# Patient Record
Sex: Female | Born: 1950 | Race: White | Hispanic: No | Smoking: Never smoker
Health system: Southern US, Community
[De-identification: ages and names within clinical notes are randomized; demographics above are authoritative.]

## PROBLEM LIST (undated history)

## (undated) DIAGNOSIS — I480 Paroxysmal atrial fibrillation: Secondary | ICD-10-CM

## (undated) DIAGNOSIS — R32 Unspecified urinary incontinence: Secondary | ICD-10-CM

## (undated) DIAGNOSIS — Z96649 Presence of unspecified artificial hip joint: Secondary | ICD-10-CM

## (undated) DIAGNOSIS — M47817 Spondylosis without myelopathy or radiculopathy, lumbosacral region: Secondary | ICD-10-CM

## (undated) DIAGNOSIS — Z800 Family history of malignant neoplasm of digestive organs: Secondary | ICD-10-CM

## (undated) DIAGNOSIS — K219 Gastro-esophageal reflux disease without esophagitis: Secondary | ICD-10-CM

## (undated) DIAGNOSIS — R609 Edema, unspecified: Secondary | ICD-10-CM

## (undated) DIAGNOSIS — Z8049 Family history of malignant neoplasm of other genital organs: Secondary | ICD-10-CM

## (undated) DIAGNOSIS — K8020 Calculus of gallbladder without cholecystitis without obstruction: Secondary | ICD-10-CM

## (undated) DIAGNOSIS — M5020 Other cervical disc displacement, unspecified cervical region: Secondary | ICD-10-CM

## (undated) DIAGNOSIS — D689 Coagulation defect, unspecified: Secondary | ICD-10-CM

## (undated) DIAGNOSIS — I872 Venous insufficiency (chronic) (peripheral): Secondary | ICD-10-CM

## (undated) DIAGNOSIS — E041 Nontoxic single thyroid nodule: Secondary | ICD-10-CM

## (undated) DIAGNOSIS — K5900 Constipation, unspecified: Secondary | ICD-10-CM

## (undated) DIAGNOSIS — Z860100 Personal history of colon polyps, unspecified: Secondary | ICD-10-CM

## (undated) DIAGNOSIS — N390 Urinary tract infection, site not specified: Secondary | ICD-10-CM

## (undated) DIAGNOSIS — R112 Nausea with vomiting, unspecified: Secondary | ICD-10-CM

## (undated) DIAGNOSIS — Z9889 Other specified postprocedural states: Secondary | ICD-10-CM

## (undated) DIAGNOSIS — C55 Malignant neoplasm of uterus, part unspecified: Secondary | ICD-10-CM

## (undated) DIAGNOSIS — I739 Peripheral vascular disease, unspecified: Secondary | ICD-10-CM

## (undated) DIAGNOSIS — G4730 Sleep apnea, unspecified: Secondary | ICD-10-CM

## (undated) DIAGNOSIS — M419 Scoliosis, unspecified: Secondary | ICD-10-CM

## (undated) DIAGNOSIS — Z86018 Personal history of other benign neoplasm: Secondary | ICD-10-CM

## (undated) DIAGNOSIS — C439 Malignant melanoma of skin, unspecified: Secondary | ICD-10-CM

## (undated) DIAGNOSIS — T884XXA Failed or difficult intubation, initial encounter: Secondary | ICD-10-CM

## (undated) DIAGNOSIS — G4733 Obstructive sleep apnea (adult) (pediatric): Secondary | ICD-10-CM

## (undated) DIAGNOSIS — C50919 Malignant neoplasm of unspecified site of unspecified female breast: Secondary | ICD-10-CM

## (undated) DIAGNOSIS — I4891 Unspecified atrial fibrillation: Secondary | ICD-10-CM

## (undated) DIAGNOSIS — Z923 Personal history of irradiation: Secondary | ICD-10-CM

## (undated) DIAGNOSIS — E669 Obesity, unspecified: Secondary | ICD-10-CM

## (undated) DIAGNOSIS — Z8542 Personal history of malignant neoplasm of other parts of uterus: Secondary | ICD-10-CM

## (undated) DIAGNOSIS — Z8719 Personal history of other diseases of the digestive system: Secondary | ICD-10-CM

## (undated) HISTORY — DX: Family history of malignant neoplasm of other genital organs: Z80.49

## (undated) HISTORY — DX: Calculus of gallbladder without cholecystitis without obstruction: K80.20

## (undated) HISTORY — DX: Personal history of malignant neoplasm of other parts of uterus: Z85.42

## (undated) HISTORY — DX: Personal history of other benign neoplasm: Z86.018

## (undated) HISTORY — DX: Family history of malignant neoplasm of digestive organs: Z80.0

## (undated) HISTORY — DX: Presence of unspecified artificial hip joint: Z96.649

## (undated) HISTORY — DX: Gastro-esophageal reflux disease without esophagitis: K21.9

## (undated) HISTORY — PX: JOINT REPLACEMENT: SHX530

## (undated) HISTORY — DX: Malignant melanoma of skin, unspecified: C43.9

## (undated) HISTORY — DX: Constipation, unspecified: K59.00

## (undated) HISTORY — DX: Obstructive sleep apnea (adult) (pediatric): G47.33

## (undated) HISTORY — PX: HIATAL HERNIA REPAIR: SHX195

## (undated) HISTORY — PX: BREAST LUMPECTOMY: SHX2

## (undated) HISTORY — DX: Coagulation defect, unspecified: D68.9

## (undated) HISTORY — DX: Unspecified atrial fibrillation: I48.91

## (undated) HISTORY — DX: Venous insufficiency (chronic) (peripheral): I87.2

## (undated) HISTORY — DX: Spondylosis without myelopathy or radiculopathy, lumbosacral region: M47.817

## (undated) HISTORY — DX: Urinary tract infection, site not specified: N39.0

## (undated) HISTORY — DX: Sleep apnea, unspecified: G47.30

## (undated) HISTORY — PX: COLONOSCOPY: SHX174

## (undated) HISTORY — DX: Personal history of colon polyps, unspecified: Z86.0100

## (undated) HISTORY — DX: Personal history of colonic polyps: Z86.010

---
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
## 1976-10-12 HISTORY — PX: BUNIONECTOMY: SHX129

## 2011-05-13 HISTORY — PX: VEIN SURGERY: SHX48

## 2011-07-11 LAB — HM COLONOSCOPY

## 2011-12-13 HISTORY — PX: MELANOMA EXCISION: SHX5266

## 2013-09-10 HISTORY — PX: ABDOMINAL HYSTERECTOMY: SHX81

## 2013-12-10 DIAGNOSIS — M47817 Spondylosis without myelopathy or radiculopathy, lumbosacral region: Secondary | ICD-10-CM | POA: Insufficient documentation

## 2013-12-10 HISTORY — DX: Spondylosis without myelopathy or radiculopathy, lumbosacral region: M47.817

## 2014-01-31 ENCOUNTER — Encounter: Payer: Self-pay | Admitting: Physical Medicine & Rehabilitation

## 2014-02-11 ENCOUNTER — Other Ambulatory Visit: Payer: Self-pay | Admitting: Physical Medicine & Rehabilitation

## 2014-02-11 ENCOUNTER — Ambulatory Visit (HOSPITAL_BASED_OUTPATIENT_CLINIC_OR_DEPARTMENT_OTHER): Payer: No Typology Code available for payment source | Admitting: Physical Medicine & Rehabilitation

## 2014-02-11 ENCOUNTER — Ambulatory Visit (HOSPITAL_COMMUNITY)
Admission: RE | Admit: 2014-02-11 | Discharge: 2014-02-11 | Disposition: A | Discharge status: Home / Self Care | Payer: No Typology Code available for payment source | Source: Ambulatory Visit | Attending: Physical Medicine & Rehabilitation | Admitting: Physical Medicine & Rehabilitation

## 2014-02-11 ENCOUNTER — Encounter: Payer: No Typology Code available for payment source | Attending: Physical Medicine & Rehabilitation

## 2014-02-11 ENCOUNTER — Encounter: Payer: Self-pay | Admitting: Physical Medicine & Rehabilitation

## 2014-02-11 VITALS — BP 124/70 | HR 74 | Resp 14 | Ht 70.0 in | Wt 231.2 lb

## 2014-02-11 DIAGNOSIS — Z79899 Other long term (current) drug therapy: Secondary | ICD-10-CM | POA: Diagnosis not present

## 2014-02-11 DIAGNOSIS — G894 Chronic pain syndrome: Secondary | ICD-10-CM | POA: Diagnosis not present

## 2014-02-11 DIAGNOSIS — M25552 Pain in left hip: Secondary | ICD-10-CM | POA: Insufficient documentation

## 2014-02-11 DIAGNOSIS — G8929 Other chronic pain: Secondary | ICD-10-CM | POA: Diagnosis not present

## 2014-02-11 DIAGNOSIS — M24559 Contracture, unspecified hip: Secondary | ICD-10-CM

## 2014-02-11 DIAGNOSIS — M4710 Other spondylosis with myelopathy, site unspecified: Secondary | ICD-10-CM | POA: Insufficient documentation

## 2014-02-11 DIAGNOSIS — M25551 Pain in right hip: Secondary | ICD-10-CM | POA: Diagnosis not present

## 2014-02-11 DIAGNOSIS — Z5181 Encounter for therapeutic drug level monitoring: Secondary | ICD-10-CM

## 2014-02-11 DIAGNOSIS — M4806 Spinal stenosis, lumbar region: Secondary | ICD-10-CM

## 2014-02-11 DIAGNOSIS — M25559 Pain in unspecified hip: Secondary | ICD-10-CM | POA: Diagnosis present

## 2014-02-11 DIAGNOSIS — M48061 Spinal stenosis, lumbar region without neurogenic claudication: Secondary | ICD-10-CM

## 2014-02-11 NOTE — Progress Notes (Signed)
Subjective:    Patient ID: [name], female    DOB: 30-May-1950, 63 y.o.   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 I did not see a lumbar spine imaging report in the records. Discussed pain management today, she does not want to add medication, has an old hydrocodone prescription which she uses occasionally.  Note that the pain is interfering with her sleep quality.  HPI  Chief complaint is low back pain.Patient referred for consultation by primary care physician listed above Duration is chronic Intermittent left lower extremity paresthesia Evaluated by neurology in [place] and then also has been seen by [facility] neurosurgery. Diagnosis was spondylosis with intermittent L5 paresthesias. No surgery recommended Sleep interference from lumbar pain and parathesia on Left  No recent PT  MRI lumbar spine was performed 07/26/2013 report reviewed. Facet degenerative changes T12-L1 and L1-L2 L2-L3 right lateral recess narrowing at L2-L3 Disc bulging at L3-L4, mild to moderate disc bulging L4-L5 severe disc facet degenerative change L4-L5 right lateral recess narrowing L4-L5 L5-S1 with severe facet degenerative changes left greater than right.  Thoracic MRI was relatively unremarkable except for some facet degenerative changes mainly on left-sided T11-T12 and T12-L1. MRI of cervical spine showed multilevel degenerative changes at multiple levels some central canal stenosis left greater than right at C5-C6 and C6-C7.  Consult requested by Dr. Luciana Axe, notes as above. Patient from [place] recently relocated to [place]. Has history of thyroid disease noted on cervical spine imaging with repeat biopsy pending. 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 General activity 5 Relation with others 0 Enjoyment of life 5 What TIME of day is your pain at its worst? any time Sleep (in general) Fair  Pain is worse with: walking, bending and standing Pain improves with: rest Relief from Meds: 3  Mobility walk without assistance how many minutes can you walk? 15 ability to climb steps?  yes do you drive?  yes  Function not employed: date last employed 09/2013 retired  Neuro/Psych bladder control problems tingling spasms dizziness  Prior Studies Any changes since last visit?  no  Physicians involved in your care Any changes since last visit?  no   Family History  Problem Relation Age of Onset  . Uterine cancer Mother   . Diabetes Brother   . Hypertension Father   . Hyperlipidemia Father    History   Social History  . Marital Status: Divorced    Spouse Name: N/A    Number of Children: N/A  . Years of Education: N/A   Social History Main Topics  . Smoking status: Never Smoker   . Smokeless tobacco: Never Used  . Alcohol Use: No  . Drug Use: No  . Sexual Activity: None   Other Topics Concern  . None   Social History Narrative  . None   Past Surgical History  Procedure Laterality Date  . Abdominal hysterectomy     Past Medical History  Diagnosis Date  .  Hypertension   . Swelling of both lower extremities   . Cancer   . Melanoma    BP 124/70 mmHg  Pulse 74  Resp 14  Ht 5\' 10"  (1.778 m)  Wt 231 lb 3.2 oz (104.872 kg)  BMI 33.17 kg/m2  SpO2 99%  Opioid Risk Score:   Fall Risk Score: Low Fall Risk (0-5  points)   Review of Systems  Constitutional: Negative.   HENT: Negative.   Eyes: Negative.   Respiratory: Negative.   Cardiovascular: Negative.   Gastrointestinal: Negative.   Endocrine: Negative.   Genitourinary: Positive for difficulty urinating.  Musculoskeletal: Positive for back pain, joint swelling and arthralgias.  Skin: Negative.   Allergic/Immunologic: Negative.   Neurological: Positive for dizziness.       Tingling, spasms  Hematological: Negative.   Psychiatric/Behavioral: Negative.        Objective:   Physical Exam  Constitutional: She is oriented to person, place, and time. She appears well-developed and well-nourished.  HENT:  Head: Normocephalic and atraumatic.  Eyes: Conjunctivae are normal. Pupils are equal, round, and reactive to light.  Neck: Normal range of motion.  Neurological: She is alert and oriented to person, place, and time. She has normal strength. No sensory deficit. Gait abnormal. Coordination normal.  Reflex Scores:      Patellar reflexes are 1+ on the right side and 2+ on the left side.      Achilles reflexes are 0 on the right side and 0 on the left side. Forward flexed gait    Psychiatric: She has a normal mood and affect.  Nursing note and vitals reviewed.  Mild dextroconvex scoliosis upper thoracic Lumbar spine range of motion 50% flexion, extension, lateral bending and rotation  No tenderness to palpation lumbar paraspinal or thoracic paraspinal muscles.  Motor strength is 5/5 bilateral deltoid, biceps, triceps, grip, hip flexor, knee extensor, ankle dorsiflexor and plantar flexor Sensory intact to light touch and pinprick in bilateral C6-C7 C8 T1, L2 L3 L4 L5-S1 dermatomal distribution Reduced internal rotation bilateral hips moderate to severe, mild reduction of external rotation     Assessment & Plan:  1.  Lumbar spondylosis multilevel with intermittent LLE radicular pain, no recent conservative care Main complaint is axial  back pain which I think is more spondylosis related rather than disc related. Recommend home exercise program, this was reviewed with patient  Recommend bilateral L3-L4 medial branch blocks and L5 dorsal ramus injection, also explained the process in evaluating the potential efficacy of radiofrequency ablation  Patient may benefit from left L5 transforaminal epidural.  Also may benefit from physical therapy referral after she tries a home exercise program. Discussed with patient agrees with plan Patient is not seeking narcotic analgesic medications at the current time. Less active recently  2. Bilateral hip contracture this to be contributing to her functional inability to don and doff socks easily, completely unable to cut her toenails. We'll check x-rays, may need PT to address this as well

## 2014-02-11 NOTE — Patient Instructions (Addendum)
Get Hip Xray  PT referral Back Exercises These exercises may help you when beginning to rehabilitate your injury. Your symptoms may resolve with or without further involvement from your physician, physical therapist or athletic trainer. While completing these exercises, remember:   Restoring tissue flexibility helps normal motion to return to the joints. This allows healthier, less painful movement and activity.  An effective stretch should be held for at least 30 seconds.  A stretch should never be painful. You should only feel a gentle lengthening or release in the stretched tissue. STRETCH - Extension, Prone on Elbows   Lie on your stomach on the floor, a bed will be too soft. Place your palms about shoulder width apart and at the height of your head.  Place your elbows under your shoulders. If this is too painful, stack pillows under your chest.  Allow your body to relax so that your hips drop lower and make contact more completely with the floor.  Hold this position for __________ seconds.  Slowly return to lying flat on the floor. Repeat __________ times. Complete this exercise __________ times per day.  RANGE OF MOTION - Extension, Prone Press Ups   Lie on your stomach on the floor, a bed will be too soft. Place your palms about shoulder width apart and at the height of your head.  Keeping your back as relaxed as possible, slowly straighten your elbows while keeping your hips on the floor. You may adjust the placement of your hands to maximize your comfort. As you gain motion, your hands will come more underneath your shoulders.  Hold this position __________ seconds.  Slowly return to lying flat on the floor. Repeat __________ times. Complete this exercise __________ times per day.  RANGE OF MOTION- Quadruped, Neutral Spine   Assume a hands and knees position on a firm surface. Keep your hands under your shoulders and your knees under your hips. You may place padding under  your knees for comfort.  Drop your head and point your tail bone toward the ground below you. This will round out your low back like an angry cat. Hold this position for __________ seconds.  Slowly lift your head and release your tail bone so that your back sags into a large arch, like an old horse.  Hold this position for __________ seconds.  Repeat this until you feel limber in your low back.  Now, find your "sweet spot." This will be the most comfortable position somewhere between the two previous positions. This is your neutral spine. Once you have found this position, tense your stomach muscles to support your low back.  Hold this position for __________ seconds. Repeat __________ times. Complete this exercise __________ times per day.  STRETCH - Flexion, Single Knee to Chest   Lie on a firm bed or floor with both legs extended in front of you.  Keeping one leg in contact with the floor, bring your opposite knee to your chest. Hold your leg in place by either grabbing behind your thigh or at your knee.  Pull until you feel a gentle stretch in your low back. Hold __________ seconds.  Slowly release your grasp and repeat the exercise with the opposite side. Repeat __________ times. Complete this exercise __________ times per day.  STRETCH - Hamstrings, Standing  Stand or sit and extend your right / left leg, placing your foot on a chair or foot stool  Keeping a slight arch in your low back and your hips straight forward.  Lead with your chest and lean forward at the waist until you feel a gentle stretch in the back of your right / left knee or thigh. (When done correctly, this exercise requires leaning only a small distance.)  Hold this position for __________ seconds. Repeat __________ times. Complete this stretch __________ times per day. STRENGTHENING - Deep Abdominals, Pelvic Tilt   Lie on a firm bed or floor. Keeping your legs in front of you, bend your knees so they are  both pointed toward the ceiling and your feet are flat on the floor.  Tense your lower abdominal muscles to press your low back into the floor. This motion will rotate your pelvis so that your tail bone is scooping upwards rather than pointing at your feet or into the floor.  With a gentle tension and even breathing, hold this position for __________ seconds. Repeat __________ times. Complete this exercise __________ times per day.  STRENGTHENING - Abdominals, Crunches   Lie on a firm bed or floor. Keeping your legs in front of you, bend your knees so they are both pointed toward the ceiling and your feet are flat on the floor. Cross your arms over your chest.  Slightly tip your chin down without bending your neck.  Tense your abdominals and slowly lift your trunk high enough to just clear your shoulder blades. Lifting higher can put excessive stress on the low back and does not further strengthen your abdominal muscles.  Control your return to the starting position. Repeat __________ times. Complete this exercise __________ times per day.  STRENGTHENING - Quadruped, Opposite UE/LE Lift   Assume a hands and knees position on a firm surface. Keep your hands under your shoulders and your knees under your hips. You may place padding under your knees for comfort.  Find your neutral spine and gently tense your abdominal muscles so that you can maintain this position. Your shoulders and hips should form a rectangle that is parallel with the floor and is not twisted.  Keeping your trunk steady, lift your right hand no higher than your shoulder and then your left leg no higher than your hip. Make sure you are not holding your breath. Hold this position __________ seconds.  Continuing to keep your abdominal muscles tense and your back steady, slowly return to your starting position. Repeat with the opposite arm and leg. Repeat __________ times. Complete this exercise __________ times per  day. Document Released: 03/18/2005 Document Revised: 05/23/2011 Document Reviewed: 06/12/2008 [facility] Patient Information 2015 [place], [place]. This information is not intended to replace advice given to you by your health care provider. Make sure you discuss any questions you have with your health care provider.

## 2014-02-12 ENCOUNTER — Telehealth: Payer: Self-pay | Admitting: *Deleted

## 2014-02-12 NOTE — Telephone Encounter (Signed)
Thinks she has a UTI and needs meds.  Doesn't know what urine test we did yesterday and if a test can be done off of it.  I called her back and told her that unfortunately that she will have to call her PCP.  We collected a urine drug screen and cannot test for UTI off of it and we do not do that care here as well.  She understands.

## 2014-02-13 LAB — PRESCRIPTION MONITORING PROFILE (SOLSTAS)
Amphetamine/Meth: NEGATIVE ng/mL
Barbiturate Screen, Urine: NEGATIVE ng/mL
Benzodiazepine Screen, Urine: NEGATIVE ng/mL
Buprenorphine, Urine: NEGATIVE ng/mL
Cannabinoid Scrn, Ur: NEGATIVE ng/mL
Carisoprodol, Urine: NEGATIVE ng/mL
Cocaine Metabolites: NEGATIVE ng/mL
Creatinine, Urine: 109.68 mg/dL (ref >20.0)
Fentanyl, Ur: NEGATIVE ng/mL
MDMA URINE: NEGATIVE ng/mL
METHADONE SCREEN, URINE: NEGATIVE ng/mL
Meperidine, Ur: NEGATIVE ng/mL
Nitrites, Initial: NEGATIVE ug/mL
Opiate Screen, Urine: NEGATIVE ng/mL
Oxycodone Screen, Ur: NEGATIVE ng/mL
Propoxyphene: NEGATIVE ng/mL
Tapentadol, urine: NEGATIVE ng/mL
Tramadol Scrn, Ur: NEGATIVE ng/mL
ZOLPIDEM, URINE: NEGATIVE ng/mL
pH, Initial: 6.8 pH (ref 4.5–8.9)

## 2014-02-13 LAB — PMP ALCOHOL METABOLITE (ETG): Ethyl Glucuronide (EtG): NEGATIVE ng/mL

## 2014-02-25 NOTE — Progress Notes (Signed)
Urine drug screen for this encounter is consistent 

## 2014-03-25 ENCOUNTER — Encounter: Payer: Self-pay | Admitting: Physical Medicine & Rehabilitation

## 2014-03-25 ENCOUNTER — Encounter: Payer: Medicare Other | Attending: Physical Medicine & Rehabilitation

## 2014-03-25 ENCOUNTER — Ambulatory Visit (HOSPITAL_BASED_OUTPATIENT_CLINIC_OR_DEPARTMENT_OTHER): Payer: Medicare Other | Admitting: Physical Medicine & Rehabilitation

## 2014-03-25 VITALS — BP 140/70 | HR 86 | Resp 14

## 2014-03-25 DIAGNOSIS — M4710 Other spondylosis with myelopathy, site unspecified: Secondary | ICD-10-CM | POA: Insufficient documentation

## 2014-03-25 DIAGNOSIS — G894 Chronic pain syndrome: Secondary | ICD-10-CM | POA: Diagnosis not present

## 2014-03-25 DIAGNOSIS — Z5181 Encounter for therapeutic drug level monitoring: Secondary | ICD-10-CM | POA: Insufficient documentation

## 2014-03-25 DIAGNOSIS — M47817 Spondylosis without myelopathy or radiculopathy, lumbosacral region: Secondary | ICD-10-CM

## 2014-03-25 DIAGNOSIS — M24559 Contracture, unspecified hip: Secondary | ICD-10-CM | POA: Insufficient documentation

## 2014-03-25 DIAGNOSIS — Z79899 Other long term (current) drug therapy: Secondary | ICD-10-CM | POA: Insufficient documentation

## 2014-03-25 DIAGNOSIS — M160 Bilateral primary osteoarthritis of hip: Secondary | ICD-10-CM

## 2014-03-25 NOTE — Progress Notes (Signed)
Bilateral Lumbar L3, L4  medial branch blocks and L 5 dorsal ramus injection under fluoroscopic guidance  Indication: Lumbar pain which is not relieved by medication management or other conservative care and interfering with self-care and mobility.  Informed consent was obtained after describing risks and benefits of the procedure with the patient, this includes bleeding, infection, paralysis and medication side effects.  The patient wishes to proceed and has given written consent.  The patient was placed in prone position.  The lumbar area was marked and prepped with Betadine.  One mL of 1% lidocaine was injected into each of 6 areas into the skin and subcutaneous tissue.  Then a 22-gauge 3.5 in spinal needle was inserted targeting the junction of the left S1 superior articular process and sacral ala junction. Needle was advanced under fluoroscopic guidance.  Bone contact was made.  Omnipaque 180 was injected x 0.5 mL demonstrating no intravascular uptake.  Then a solution containing one mL of 4 mg per mL dexamethasone and 3 mL of 2% MPF lidocaine was injected x 0.5 mL.  Then the left L5 superior articular process in transverse process junction was targeted.  Bone contact was made.  Omnipaque 180 was injected x 0.5 mL demonstrating no intravascular uptake. Then a solution containing one mL of 4 mg per mL dexamethasone and 3 mL of 2% MPF lidocaine was injected x 0.5 mL.  Then the left L4 superior articular process in transverse process junction was targeted.  Bone contact was made.  Omnipaque 180 was injected x 0.5 mL demonstrating no intravascular uptake.  Then a solution containing one mL of 4 mg per mL dexamethasone and 3 mL if 2% MPF lidocaine was injected x 0.5 mL.  This same procedure was performed on the right side using the same needle, technique and injectate.  Patient tolerated procedure well.  Post procedure instructions were given.

## 2014-03-25 NOTE — Progress Notes (Signed)
  PROCEDURE RECORD [facility]   Name: [name] DOB:21-Aug-1950 MRN: [id number]  Date:03/25/2014  Physician: Alysia Penna, MD    Nurse/CMA: Dannon Nguyenthi   Allergies: No Known Allergies  Consent Signed: Yes.    Is patient diabetic? No.  CBG today? .  Pregnant: No. LMP: No LMP recorded. (age 65-55)  Anticoagulants: no Anti-inflammatory: no Antibiotics: no  Procedure: Bilateral Medial Branch Block Position: Prone Start Time: 1:06pm  End Time: 1:18 Fluoro Time: 35  RN/CMA Emmanuel Ercole Desirey Keahey    Time 12:45pm 1:21pm    BP 140/70 130/68    Pulse 86 68    Respirations 14 14    O2 Sat 99 96    S/S 6 6    Pain Level 4/10  2/10     D/C home with daughter, patient A & O X 3, D/C instructions reviewed, and sits independently.

## 2014-03-25 NOTE — Patient Instructions (Signed)

## 2014-03-26 DIAGNOSIS — Z13220 Encounter for screening for lipoid disorders: Secondary | ICD-10-CM | POA: Diagnosis not present

## 2014-03-26 DIAGNOSIS — I480 Paroxysmal atrial fibrillation: Secondary | ICD-10-CM | POA: Diagnosis not present

## 2014-03-26 DIAGNOSIS — Z23 Encounter for immunization: Secondary | ICD-10-CM | POA: Diagnosis not present

## 2014-03-26 DIAGNOSIS — M549 Dorsalgia, unspecified: Secondary | ICD-10-CM | POA: Diagnosis not present

## 2014-03-26 DIAGNOSIS — E785 Hyperlipidemia, unspecified: Secondary | ICD-10-CM | POA: Diagnosis not present

## 2014-03-26 DIAGNOSIS — E041 Nontoxic single thyroid nodule: Secondary | ICD-10-CM | POA: Diagnosis not present

## 2014-03-26 DIAGNOSIS — M47817 Spondylosis without myelopathy or radiculopathy, lumbosacral region: Secondary | ICD-10-CM | POA: Diagnosis not present

## 2014-03-27 ENCOUNTER — Telehealth: Payer: Self-pay | Admitting: *Deleted

## 2014-03-27 NOTE — Telephone Encounter (Signed)
Patient called and left message asking about PT and should she consider it. Was just in the office on 01/12 for a procedure.  Should she pursue PT prior to her appt on 04/15/14. Please advise

## 2014-03-27 NOTE — Telephone Encounter (Signed)
I advised the patient starts PT prior to next visit Please ask her how she did with last injection

## 2014-03-28 NOTE — Telephone Encounter (Signed)
Called patient and left message that she should try to start therapy ASAP.  Left message

## 2014-04-02 ENCOUNTER — Telehealth: Payer: Self-pay | Admitting: *Deleted

## 2014-04-02 MED ORDER — TRAMADOL HCL 50 MG PO TABS: 50.0000 mg | ORAL_TABLET | Freq: Three times a day (TID) | ORAL | Status: DC | PRN

## 2014-04-02 NOTE — Telephone Encounter (Signed)
Called patient and told her Dr. Kathrin Penner will RX Tramadol 50 mg 1 tablet TID PRN. Called into [facility] [phone] called in #[phone] RF#1

## 2014-04-02 NOTE — Telephone Encounter (Signed)
Patient called and left message... Is looking for something for pain. Stated that the procedure did not last.  Please advise

## 2014-04-02 NOTE — Telephone Encounter (Signed)
May call in Tramadol 50mg  TID #90 1 RF Schedule for repeat injection

## 2014-04-03 DIAGNOSIS — Z85820 Personal history of malignant melanoma of skin: Secondary | ICD-10-CM | POA: Diagnosis not present

## 2014-04-03 DIAGNOSIS — I8311 Varicose veins of right lower extremity with inflammation: Secondary | ICD-10-CM | POA: Diagnosis not present

## 2014-04-03 DIAGNOSIS — L814 Other melanin hyperpigmentation: Secondary | ICD-10-CM | POA: Diagnosis not present

## 2014-04-03 DIAGNOSIS — D485 Neoplasm of uncertain behavior of skin: Secondary | ICD-10-CM | POA: Diagnosis not present

## 2014-04-03 DIAGNOSIS — I8312 Varicose veins of left lower extremity with inflammation: Secondary | ICD-10-CM | POA: Diagnosis not present

## 2014-04-07 ENCOUNTER — Ambulatory Visit: Payer: Medicare Other | Attending: Physical Medicine & Rehabilitation | Admitting: Physical Therapy

## 2014-04-07 DIAGNOSIS — Z85820 Personal history of malignant melanoma of skin: Secondary | ICD-10-CM | POA: Diagnosis not present

## 2014-04-07 DIAGNOSIS — I10 Essential (primary) hypertension: Secondary | ICD-10-CM | POA: Diagnosis not present

## 2014-04-07 DIAGNOSIS — L820 Inflamed seborrheic keratosis: Secondary | ICD-10-CM | POA: Diagnosis not present

## 2014-04-07 DIAGNOSIS — M4710 Other spondylosis with myelopathy, site unspecified: Secondary | ICD-10-CM | POA: Insufficient documentation

## 2014-04-07 DIAGNOSIS — M24551 Contracture, right hip: Secondary | ICD-10-CM | POA: Insufficient documentation

## 2014-04-07 DIAGNOSIS — M24552 Contracture, left hip: Secondary | ICD-10-CM | POA: Insufficient documentation

## 2014-04-07 DIAGNOSIS — Z8542 Personal history of malignant neoplasm of other parts of uterus: Secondary | ICD-10-CM | POA: Diagnosis not present

## 2014-04-09 ENCOUNTER — Ambulatory Visit: Payer: Medicare Other

## 2014-04-09 DIAGNOSIS — Z85820 Personal history of malignant melanoma of skin: Secondary | ICD-10-CM | POA: Diagnosis not present

## 2014-04-09 DIAGNOSIS — M24551 Contracture, right hip: Secondary | ICD-10-CM | POA: Diagnosis not present

## 2014-04-09 DIAGNOSIS — M24552 Contracture, left hip: Secondary | ICD-10-CM | POA: Diagnosis not present

## 2014-04-09 DIAGNOSIS — M4710 Other spondylosis with myelopathy, site unspecified: Secondary | ICD-10-CM | POA: Diagnosis not present

## 2014-04-09 DIAGNOSIS — I10 Essential (primary) hypertension: Secondary | ICD-10-CM | POA: Diagnosis not present

## 2014-04-09 DIAGNOSIS — Z8542 Personal history of malignant neoplasm of other parts of uterus: Secondary | ICD-10-CM | POA: Diagnosis not present

## 2014-04-14 ENCOUNTER — Ambulatory Visit: Payer: Medicare Other | Attending: Physical Medicine & Rehabilitation | Admitting: Physical Therapy

## 2014-04-14 DIAGNOSIS — M4710 Other spondylosis with myelopathy, site unspecified: Secondary | ICD-10-CM | POA: Insufficient documentation

## 2014-04-14 DIAGNOSIS — I10 Essential (primary) hypertension: Secondary | ICD-10-CM | POA: Insufficient documentation

## 2014-04-14 DIAGNOSIS — I4891 Unspecified atrial fibrillation: Secondary | ICD-10-CM | POA: Insufficient documentation

## 2014-04-14 DIAGNOSIS — Z85820 Personal history of malignant melanoma of skin: Secondary | ICD-10-CM | POA: Diagnosis not present

## 2014-04-14 DIAGNOSIS — K219 Gastro-esophageal reflux disease without esophagitis: Secondary | ICD-10-CM | POA: Diagnosis not present

## 2014-04-14 DIAGNOSIS — M160 Bilateral primary osteoarthritis of hip: Secondary | ICD-10-CM | POA: Insufficient documentation

## 2014-04-15 ENCOUNTER — Encounter: Payer: Medicare Other | Attending: Physical Medicine & Rehabilitation

## 2014-04-15 ENCOUNTER — Ambulatory Visit (HOSPITAL_BASED_OUTPATIENT_CLINIC_OR_DEPARTMENT_OTHER): Payer: Medicare Other | Admitting: Physical Medicine & Rehabilitation

## 2014-04-15 ENCOUNTER — Encounter: Payer: Self-pay | Admitting: Physical Medicine & Rehabilitation

## 2014-04-15 VITALS — BP 133/79 | HR 75 | Resp 14

## 2014-04-15 DIAGNOSIS — M24559 Contracture, unspecified hip: Secondary | ICD-10-CM | POA: Diagnosis not present

## 2014-04-15 DIAGNOSIS — M4710 Other spondylosis with myelopathy, site unspecified: Secondary | ICD-10-CM | POA: Insufficient documentation

## 2014-04-15 DIAGNOSIS — G894 Chronic pain syndrome: Secondary | ICD-10-CM | POA: Insufficient documentation

## 2014-04-15 DIAGNOSIS — M160 Bilateral primary osteoarthritis of hip: Secondary | ICD-10-CM

## 2014-04-15 DIAGNOSIS — M1611 Unilateral primary osteoarthritis, right hip: Secondary | ICD-10-CM

## 2014-04-15 DIAGNOSIS — M47817 Spondylosis without myelopathy or radiculopathy, lumbosacral region: Secondary | ICD-10-CM | POA: Diagnosis not present

## 2014-04-15 DIAGNOSIS — Z5181 Encounter for therapeutic drug level monitoring: Secondary | ICD-10-CM | POA: Diagnosis not present

## 2014-04-15 DIAGNOSIS — M1612 Unilateral primary osteoarthritis, left hip: Secondary | ICD-10-CM

## 2014-04-15 DIAGNOSIS — Z79899 Other long term (current) drug therapy: Secondary | ICD-10-CM | POA: Insufficient documentation

## 2014-04-15 NOTE — Patient Instructions (Addendum)
I would recommend Dr Ninfa Linden at [facility] for hip replacement via anterior approach  Dr Colin Benton at [facility]

## 2014-04-15 NOTE — Progress Notes (Addendum)
  PROCEDURE RECORD [facility]   Name: [name] DOB:1950/11/04 MRN: [id number]  Date:04/15/2014  Physician: Alysia Penna, MD    Nurse/CMA: MaryBeth Ginkel  Allergies: No Known Allergies  Consent Signed: Yes.    Is patient diabetic? No.  CBG today?   Pregnant: No. LMP: No LMP recorded. (age 65-55)  Anticoagulants: yes (ibuprofen last dose: 04/14/2014 PM) Anti-inflammatory: no Antibiotics: no  Procedure: bilateral medial branch block  Position: Prone Start Time:   End Time:   Fluoro Time:   RN/CMA Ken Mindy Behnken MaryBeth Ginkel    Time 3:00PM 3:463m    BP 133/79 138/66    Pulse 75 72    Respirations 14 14    O2 Sat 96 96    S/S 6 6    Pain Level 2/10 Post 0/10     D/C home with daughter , patient A & O X 3, D/C instructions reviewed, and sits independently.

## 2014-04-15 NOTE — Progress Notes (Signed)
Bilateral Lumbar L3, L4  medial branch blocks and L 5 dorsal ramus injection under fluoroscopic guidance  Indication: Lumbar pain which is not relieved by medication management or other conservative care and interfering with self-care and mobility.  Informed consent was obtained after describing risks and benefits of the procedure with the patient, this includes bleeding, infection, paralysis and medication side effects.  The patient wishes to proceed and has given written consent.  The patient was placed in prone position.  The lumbar area was marked and prepped with Betadine.  One mL of 1% lidocaine was injected into each of 6 areas into the skin and subcutaneous tissue.  Then a 22-gauge 3.5 in spinal needle was inserted targeting the junction of the left S1 superior articular process and sacral ala junction. Needle was advanced under fluoroscopic guidance.  Bone contact was made.  Omnipaque 180 was injected x 0.5 mL demonstrating no intravascular uptake.  Then a solution containing one mL of 4 mg per mL dexamethasone and 3 mL of 2% MPF lidocaine was injected x 0.5 mL.  Then the left L5 superior articular process in transverse process junction was targeted.  Bone contact was made.  Omnipaque 180 was injected x 0.5 mL demonstrating no intravascular uptake. Then a solution containing one mL of 4 mg per mL dexamethasone and 3 mL of 2% MPF lidocaine was injected x 0.5 mL.  Then the left L4 superior articular process in transverse process junction was targeted.  Bone contact was made.  Omnipaque 180 was injected x 0.5 mL demonstrating no intravascular uptake.  Then a solution containing one mL of 4 mg per mL dexamethasone and 3 mL if 2% MPF lidocaine was injected x 0.5 mL.  This same procedure was performed on the right side using the same needle, technique and injectate.  Patient tolerated procedure well.  Post procedure instructions were given.  100% back pain relief after injection post thigh pain on left also  relieved At rest pain only 2/10 pre injection but has increased pain with activity

## 2014-04-17 ENCOUNTER — Ambulatory Visit: Payer: Medicare Other

## 2014-04-17 DIAGNOSIS — M160 Bilateral primary osteoarthritis of hip: Secondary | ICD-10-CM | POA: Diagnosis not present

## 2014-04-17 DIAGNOSIS — K219 Gastro-esophageal reflux disease without esophagitis: Secondary | ICD-10-CM | POA: Diagnosis not present

## 2014-04-17 DIAGNOSIS — M4710 Other spondylosis with myelopathy, site unspecified: Secondary | ICD-10-CM | POA: Diagnosis not present

## 2014-04-17 DIAGNOSIS — I4891 Unspecified atrial fibrillation: Secondary | ICD-10-CM | POA: Diagnosis not present

## 2014-04-17 DIAGNOSIS — Z85820 Personal history of malignant melanoma of skin: Secondary | ICD-10-CM | POA: Diagnosis not present

## 2014-04-17 DIAGNOSIS — I10 Essential (primary) hypertension: Secondary | ICD-10-CM | POA: Diagnosis not present

## 2014-04-21 ENCOUNTER — Ambulatory Visit: Payer: Medicare Other | Admitting: Physical Therapy

## 2014-04-21 DIAGNOSIS — M25551 Pain in right hip: Secondary | ICD-10-CM | POA: Diagnosis not present

## 2014-04-22 ENCOUNTER — Encounter: Payer: Self-pay | Admitting: Internal Medicine

## 2014-04-22 ENCOUNTER — Ambulatory Visit (INDEPENDENT_AMBULATORY_CARE_PROVIDER_SITE_OTHER): Payer: Medicare Other | Admitting: Internal Medicine

## 2014-04-22 VITALS — BP 124/82 | HR 68 | Ht 70.0 in | Wt 224.4 lb

## 2014-04-22 DIAGNOSIS — Z79899 Other long term (current) drug therapy: Secondary | ICD-10-CM | POA: Diagnosis not present

## 2014-04-22 DIAGNOSIS — Z01810 Encounter for preprocedural cardiovascular examination: Secondary | ICD-10-CM | POA: Diagnosis not present

## 2014-04-22 DIAGNOSIS — Z8542 Personal history of malignant neoplasm of other parts of uterus: Secondary | ICD-10-CM

## 2014-04-22 DIAGNOSIS — M160 Bilateral primary osteoarthritis of hip: Secondary | ICD-10-CM | POA: Diagnosis not present

## 2014-04-22 DIAGNOSIS — I4891 Unspecified atrial fibrillation: Secondary | ICD-10-CM | POA: Insufficient documentation

## 2014-04-22 DIAGNOSIS — I480 Paroxysmal atrial fibrillation: Secondary | ICD-10-CM

## 2014-04-22 HISTORY — DX: Personal history of malignant neoplasm of other parts of uterus: Z85.42

## 2014-04-22 MED ORDER — FLECAINIDE ACETATE 50 MG PO TABS: 50.0000 mg | ORAL_TABLET | Freq: Two times a day (BID) | ORAL | Status: DC

## 2014-04-22 MED ORDER — DILTIAZEM HCL ER BEADS 240 MG PO CP24: 240.0000 mg | ORAL_CAPSULE | Freq: Every day | ORAL | Status: DC

## 2014-04-22 NOTE — Patient Instructions (Signed)
Your physician wants you to follow-up in: 4 months with Dr. Debara Pickett. You will receive a reminder letter in the mail two months in advance. If you don't receive a letter, please call our office to schedule the follow-up appointment.  Clover Mealy - PCP Dr. Maudie Mercury Dr. Regis Bill

## 2014-04-22 NOTE — Progress Notes (Signed)
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 She is from [place] and was living in the [place] area. Her past medical history is significant for atrial fibrillation. This was first noted in 2012 and she was controlled initially with diltiazem. She was in a hospital in atrial fibrillation and spontaneously converted back to sinus. Subsequently she had some breakthroughs in her dose of diltiazem was increased. Eventually she saw different cardiologist and was placed on flecainide 50 mg twice a day has had only one breakthrough that she is aware of since starting on this medication. This was associated with a UTI which she was given Cipro, which I cautioned her about using in the future due to QT interactions. She also has a history of uterine cancer fortunately and is a survivor. She continues to have problems with bilateral hip osteoarthritis and is scheduled to have left hip replacement in the near future. She denies any chest pain or shortness of breath with exertion. She reports her prior cardiologist in [place] did stress testing within the last 2 years and has done several stress tests and she started with atrial fibrillation. There is no family history of coronary disease although cancer is somewhat rapid in the family.  PMHx:  Past Medical History  Diagnosis Date  . Hypertension   . Swelling of both lower extremities   . Atrial fibrillation   . GERD (gastroesophageal reflux disease)   . Melanoma   . Skin cancer     Past Surgical History  Procedure Laterality Date  . Abdominal hysterectomy  09/10/2013  . Melanoma excision  12/2011  . Vein surgery  05/2011    venous ablation   . Bunionectomy  10/1976  . Fracture surgery  09/1964    FAMHx:  Family History  Problem Relation Age of Onset  . Uterine cancer Mother   . Diabetes  Brother 22  . Hypertension Father 44  . Hyperlipidemia Father   . Cancer Father 51  . Epilepsy Father 56  . Arthritis Sister 51  . Cancer Maternal Grandmother   . Stroke Paternal Grandfather   . Arthritis Sister     SOCHx:   reports that she has never smoked. She has never used smokeless tobacco. She reports that she does not drink alcohol or use illicit drugs.  ALLERGIES:  Allergies  Allergen Reactions  . Hydrolyzed Silk Hives    Silk tape-blisters    ROS: A comprehensive review of systems was negative except for: Musculoskeletal: positive for Hip pain  HOME MEDS: Current Outpatient Prescriptions  Medication Sig Dispense Refill  . aspirin 325 MG tablet Take 325 mg by mouth daily.    Marland Kitchen diltiazem (TIAZAC) 240 MG 24 hr capsule Take 1 capsule (240 mg total) by mouth daily. 90 capsule 3  . flecainide (TAMBOCOR) 50 MG tablet Take 1 tablet (50 mg total) by mouth 2 (two) times daily. 180 tablet 3  . ibuprofen (ADVIL,MOTRIN) 600 MG tablet Take 600 mg by mouth every 6 (six) hours as needed.    . senna (SENOKOT) 8.6 MG tablet Take 1 tablet by mouth daily.    Marland Kitchen spironolactone (ALDACTONE) 50 MG tablet Take 50 mg by mouth 2 (two) times daily.    . traMADol (ULTRAM) 50 MG tablet Take 1 tablet (50 mg total) by mouth 3 (three) times daily as  needed (Take 1 tablet every 8 hours as needed for pain). 90 tablet 1  . pantoprazole (PROTONIX) 40 MG tablet Take 1 tablet by mouth daily.  1  . triamcinolone cream (KENALOG) 0.1 % Apply 1 application topically as needed.  1   No current facility-administered medications for this visit.    LABS/IMAGING: No results found for this or any previous visit (from the past 48 hour(s)). No results found.  VITALS: BP 124/82 mmHg  Pulse 68  Ht 5\' 10"  (1.778 m)  Wt 224 lb 6.4 oz (101.787 kg)  BMI 32.20 kg/m2  EXAM: General appearance: alert and no distress Neck: no carotid bruit, no JVD and thyroid not enlarged, symmetric, no  tenderness/mass/nodules Lungs: clear to auscultation bilaterally Heart: regular rate and rhythm, S1, S2 normal, no murmur, click, rub or gallop Abdomen: soft, non-tender; bowel sounds normal; no masses,  no organomegaly Extremities: extremities normal, atraumatic, no cyanosis or edema Pulses: 2+ and symmetric Skin: Skin color, texture, turgor normal. No rashes or lesions Neurologic: Grossly normal Psych: Pleasant'  EKG: Normal sinus rhythm at 68  ASSESSMENT: 1. History of paroxysmal atrial fibrillation - CHADSVASC of 1 (female/64) 2. Long term antiarrhythmic therapy on flecainide 3. Low risk for upcoming hip surgery 4. History of uterine cancer 5. Bilateral hip osteoarthritis  PLAN: 1.   Mrs. Brickell is low risk for upcoming hip surgery. I do not feel that additional testing is necessary she's had stress testing within the last year. There must be caution uses she is on flecainide to avoid medications that may prolong QT interval. With regards to anticoagulation, she is currently on full dose aspirin. Her CHADSVASC score is 1 for being 61 and female, I'm not strongly advocating for her to be on stronger anticoagulation although she is borderline between aspirin and warfarin and/or a NOAC. For now I recommend continuing aspirin. Her burden of A. fib appears to be pretty low. I will go ahead and request records from her cardiologist in [place]. Plan to see her back in about some 3-6 months after her surgery.  Pixie Casino, MD, [facility] Attending Cardiologist [facility]  Simranjit Thayer C 04/22/2014, 2:50 PM

## 2014-04-23 ENCOUNTER — Telehealth: Payer: Self-pay | Admitting: Internal Medicine

## 2014-04-23 NOTE — Telephone Encounter (Signed)
Faxed signed Release from patient to Dr Enid Cutter @ [facility] to obtain records per Dr Debara Pickett.  Faxed on 04/22/14. lp

## 2014-04-24 ENCOUNTER — Ambulatory Visit: Payer: Medicare Other

## 2014-04-24 DIAGNOSIS — K219 Gastro-esophageal reflux disease without esophagitis: Secondary | ICD-10-CM | POA: Diagnosis not present

## 2014-04-24 DIAGNOSIS — Z85820 Personal history of malignant melanoma of skin: Secondary | ICD-10-CM | POA: Diagnosis not present

## 2014-04-24 DIAGNOSIS — I4891 Unspecified atrial fibrillation: Secondary | ICD-10-CM | POA: Diagnosis not present

## 2014-04-24 DIAGNOSIS — M160 Bilateral primary osteoarthritis of hip: Secondary | ICD-10-CM | POA: Diagnosis not present

## 2014-04-24 DIAGNOSIS — I10 Essential (primary) hypertension: Secondary | ICD-10-CM | POA: Diagnosis not present

## 2014-04-24 DIAGNOSIS — M4710 Other spondylosis with myelopathy, site unspecified: Secondary | ICD-10-CM | POA: Diagnosis not present

## 2014-04-28 ENCOUNTER — Ambulatory Visit: Payer: Medicare Other | Admitting: Physical Therapy

## 2014-05-01 ENCOUNTER — Ambulatory Visit: Payer: Medicare Other

## 2014-05-01 DIAGNOSIS — M160 Bilateral primary osteoarthritis of hip: Secondary | ICD-10-CM | POA: Diagnosis not present

## 2014-05-01 DIAGNOSIS — M25559 Pain in unspecified hip: Secondary | ICD-10-CM

## 2014-05-01 DIAGNOSIS — R6889 Other general symptoms and signs: Secondary | ICD-10-CM

## 2014-05-01 DIAGNOSIS — M25659 Stiffness of unspecified hip, not elsewhere classified: Secondary | ICD-10-CM

## 2014-05-01 DIAGNOSIS — M545 Low back pain, unspecified: Secondary | ICD-10-CM

## 2014-05-01 DIAGNOSIS — I10 Essential (primary) hypertension: Secondary | ICD-10-CM | POA: Diagnosis not present

## 2014-05-01 DIAGNOSIS — K219 Gastro-esophageal reflux disease without esophagitis: Secondary | ICD-10-CM | POA: Diagnosis not present

## 2014-05-01 DIAGNOSIS — M4710 Other spondylosis with myelopathy, site unspecified: Secondary | ICD-10-CM | POA: Diagnosis not present

## 2014-05-01 DIAGNOSIS — I4891 Unspecified atrial fibrillation: Secondary | ICD-10-CM | POA: Diagnosis not present

## 2014-05-01 DIAGNOSIS — Z85820 Personal history of malignant melanoma of skin: Secondary | ICD-10-CM | POA: Diagnosis not present

## 2014-05-01 NOTE — Patient Instructions (Signed)
Strengthening: Hip Extension (Prone)   Tighten muscles on front of left thigh, then lift leg __6__ inches from surface, keeping knee locked. Repeat _10___ times per set. Do __2__ sets per session. Do _2___ sessions per day. http://orth.exer.us/621   Copyright  VHI. All rights reserved.

## 2014-05-01 NOTE — Therapy (Signed)
[facility] [address] [place], [place], [place], [address] Phone: [phone]   Fax:  [phone]  Physical Therapy Treatment  Patient Details  Name: [name] MRN: [id number] Date of Birth: 02-Mar-1951 Referring Provider:  Bartholome Bill, MD  Encounter Date: 05/01/2014      PT End of Session - 05/01/14 0851    Visit Number 6   Date for PT Re-Evaluation 06/02/14   PT Start Time 0846   PT Stop Time 0944   PT Time Calculation (min) 58 min   Activity Tolerance Patient tolerated treatment well   Behavior During Therapy [facility] for tasks assessed/performed      Past Medical History  Diagnosis Date  . Hypertension   . Swelling of both lower extremities   . Atrial fibrillation   . GERD (gastroesophageal reflux disease)   . Melanoma   . Skin cancer     Past Surgical History  Procedure Laterality Date  . Abdominal hysterectomy  09/10/2013  . Melanoma excision  12/2011  . Vein surgery  05/2011    venous ablation   . Bunionectomy  10/1976  . Fracture surgery  09/1964    There were no vitals taken for this visit.  Visit Diagnosis:  Hip stiffness, unspecified laterality  Hip pain, unspecified laterality  Low back pain associated with a spinal disorder other than radiculopathy or spinal stenosis  Activity intolerance      Subjective Assessment - 05/01/14 0856    Symptoms Pt has had a busy and tiring week.  Pt's mother in law fell and has been in the hospital.  Lt THA scheduled for 06/13/14.   Currently in Pain? 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  No   Is the patient reluctant to leave their home because of a fear of falling?  No   Home Environment   Living Enviornment Private residence   Prior Function   Level of Independence Independent with basic ADLs                  [facility] Adult PT Treatment/Exercise - 05/01/14 0001    Exercises   Exercises Knee/Hip;Lumbar   Lumbar Exercises: Aerobic   Stationary Bike Level 2x 8 minutes  Concurrent assessmet of goals    Lumbar Exercises: Standing   Other Standing Lumbar Exercises mini tramp: weight shifting with abdominal bracing 3 waysx 1 min. each   Knee/Hip Exercises: Stretches   Hip Flexor Stretch 3 reps;20 seconds  Edge of the mat   Knee/Hip Exercises: Standing   Forward Step Up Both;2 sets;10 reps   Knee/Hip Exercises: Prone   Hip Extension Strengthening;AROM;Both;2 sets   Modalities   Modalities Electrical Stimulation;Moist Heat  Moist Heat Therapy   Number Minutes Moist Heat 15 Minutes   Moist Heat Location --  Lumbar    Electrical Stimulation   Electrical Stimulation Location Lumbar    Electrical Stimulation Action interferential   Electrical Stimulation Parameters 15 minutes   Electrical Stimulation Goals Pain   Manual Therapy   Manual Therapy Myofascial release;Other (comment);Passive ROM  orange roller over bilateral hip flexors   Myofascial Release use of orange roller ball and manual for soft tissue elongation   Passive ROM overpressue into hip extension bilaterally to pt tolerance                PT Education - 05/01/14 0917    Education Details prone hip extension   Person(s) Educated Patient   Methods Explanation;Handout   Comprehension Verbalized  understanding;Returned demonstration          PT Short Term Goals - 05/01/14 0845    PT SHORT TERM GOAL #1   Title be independent in initial HEP for flexibility exercises   Time 4   Period Weeks   Status Achieved   PT SHORT TERM GOAL #2   Title understand sleeping positions that can reduce pain   Time 4   Status Achieved   PT SHORT TERM GOAL #3   Title ambulate in a store with cart with moderate pain   Time 4   Period Weeks   Status On-going  6/10 and fatigue reported   PT SHORT TERM GOAL #4   Period Weeks           PT Long Term Goals - 05/01/14 0849    PT LONG TERM GOAL #1   Title demonstrate understanding of posture and body mechanics   Time 8   Period Weeks   Status Achieved   PT LONG TERM GOAL #2   Title be independent in advanced HEP for lumbar strength   Time 8   Status On-going  independent in current HEP and compliant   PT LONG TERM GOAL #3   Title ambulate in a store with cart with mimimal to moderate pain   Time 8   Period Weeks   Status On-going  6/10 reported   PT LONG TERM GOAL #4   Title sleep for 6 hours with miminal to moderate pain   Period Weeks  able to sleep 2 hours with minimal to moderate pain   Status On-going   PT LONG TERM GOAL #5   Title TUG test < or = to 12 seconds   Time 8   Period Weeks   Status Achieved               Plan - 05/01/14 4098    Clinical Impression Statement Pt reports 30% overall improvement since the start of care.  Pt demonstrates improved ease of movement with hip flexibility exercises   Pt will benefit from skilled therapeutic intervention in order to improve on the following deficits Impaired flexibility;Pain;Improper body mechanics;Decreased mobility;Decreased activity tolerance;Decreased endurance;Decreased strength;Difficulty walking   Rehab Potential Good   PT Frequency 2x / week   PT Duration 8 weeks   PT Treatment/Interventions ADLs/Self Care Home Management;Electrical  Stimulation;Therapeutic exercise;Patient/family education;Moist Heat;Neuromuscular re-education;Therapeutic activities   PT Next Visit Plan Reveiw new HEP.  Continue core strength, hip strength and flexibility.  Modalities and manual PRN.   Consulted and Agree with Plan of Care Patient        Problem List Patient Active Problem List   Diagnosis Date Noted  .  Preoperative cardiovascular examination 04/22/2014  . PAF (paroxysmal atrial fibrillation) 04/22/2014  . Long term current use of antiarrhythmic drug 04/22/2014  . History of uterine cancer 04/22/2014  . Osteoarthritis, hip, bilateral 03/25/2014  . Lumbar and sacral osteoarthritis 12/10/2013    Kapil Petropoulos, PT 05/01/2014, 9:38 AM  [facility] [address] [place], [place], [place], [address] Phone: [phone]   Fax:  [phone]

## 2014-05-02 ENCOUNTER — Other Ambulatory Visit (HOSPITAL_COMMUNITY): Payer: Self-pay | Admitting: Orthopaedic Surgery

## 2014-05-05 ENCOUNTER — Ambulatory Visit: Payer: Medicare Other

## 2014-05-05 DIAGNOSIS — M545 Low back pain, unspecified: Secondary | ICD-10-CM

## 2014-05-05 DIAGNOSIS — M4710 Other spondylosis with myelopathy, site unspecified: Secondary | ICD-10-CM | POA: Diagnosis not present

## 2014-05-05 DIAGNOSIS — R6889 Other general symptoms and signs: Secondary | ICD-10-CM

## 2014-05-05 DIAGNOSIS — Z85820 Personal history of malignant melanoma of skin: Secondary | ICD-10-CM | POA: Diagnosis not present

## 2014-05-05 DIAGNOSIS — I10 Essential (primary) hypertension: Secondary | ICD-10-CM | POA: Diagnosis not present

## 2014-05-05 DIAGNOSIS — M160 Bilateral primary osteoarthritis of hip: Secondary | ICD-10-CM | POA: Diagnosis not present

## 2014-05-05 DIAGNOSIS — I4891 Unspecified atrial fibrillation: Secondary | ICD-10-CM | POA: Diagnosis not present

## 2014-05-05 DIAGNOSIS — K219 Gastro-esophageal reflux disease without esophagitis: Secondary | ICD-10-CM | POA: Diagnosis not present

## 2014-05-05 DIAGNOSIS — M25659 Stiffness of unspecified hip, not elsewhere classified: Secondary | ICD-10-CM

## 2014-05-05 DIAGNOSIS — M25559 Pain in unspecified hip: Secondary | ICD-10-CM

## 2014-05-05 NOTE — Therapy (Signed)
[facility] [address] [place], [place], [place], [address] Phone: [phone]   Fax:  [phone]  Physical Therapy Treatment  Patient Details  Name: [name] MRN: [id number] Date of Birth: 1950/11/22 Referring Provider:  Bartholome Bill, MD  Encounter Date: 05/05/2014      PT End of Session - 05/05/14 0939    Visit Number 7   Number of Visits --  10-Medicare   Date for PT Re-Evaluation 06/02/14   PT Start Time 0928   PT Stop Time 1029   PT Time Calculation (min) 61 min   Activity Tolerance Patient tolerated treatment well   Behavior During Therapy [facility] for tasks assessed/performed      Past Medical History  Diagnosis Date  . Hypertension   . Swelling of both lower extremities   . Atrial fibrillation   . GERD (gastroesophageal reflux disease)   . Melanoma   . Skin cancer     Past Surgical History  Procedure Laterality Date  . Abdominal hysterectomy  09/10/2013  . Melanoma excision  12/2011  . Vein surgery  05/2011    venous ablation   . Bunionectomy  10/1976  . Fracture surgery  09/1964    There were no vitals taken for this visit.  Visit Diagnosis:  Hip stiffness, unspecified laterality  Hip pain, unspecified laterality  Low back pain associated with a spinal disorder other than radiculopathy or spinal stenosis  Activity intolerance      Subjective Assessment - 05/05/14 0926    Symptoms I rode the bike this morning at the fitness center for 10 minutes   Currently in Pain? Yes   Pain Score 5    Pain Location Back   Pain Orientation Left   Pain Descriptors / Indicators Sore;Aching   Pain Type Chronic pain   Pain Onset More than a month ago   Pain Frequency Intermittent   Aggravating Factors  standing, walking   Pain Relieving Factors meds, rest, heat, sitting   Effect of Pain on Daily Activities limited tolerance for stnading tasks   Multiple Pain Sites Yes   Pain Score 5   Pain Type  Chronic pain   Pain Location Hip   Pain Orientation Left   Pain Descriptors / Indicators Aching;Sore;Shooting   Pain Frequency Intermittent                    [facility] Adult PT Treatment/Exercise - 05/05/14 0001    Lumbar Exercises: Stretches   Single Knee to Chest Stretch 3 reps;20 seconds   Lumbar Exercises: Aerobic   Stationary Bike Level 2x 8 minutes  Concurrent assessmet of goals    Lumbar Exercises: Standing   Other Standing Lumbar Exercises mini tramp: weight shifting with abdominal bracing 3 waysx 1 min. each   Lumbar Exercises: Supine   Bridge 5 seconds;10 reps   Knee/Hip Exercises: Stretches   Active Hamstring Stretch 3 reps;20 seconds  seated on mat table   Hip Flexor Stretch 3 reps;20 seconds  Edge of the mat   Knee/Hip Exercises: Standing   Forward Step Up Both;2 sets;10 reps   Knee/Hip Exercises: Prone   Hip Extension Strengthening;AROM;Both;2 sets  Tactile cues needed to prevent substitution   Modalities   Modalities Electrical Stimulation;Moist Heat   Moist Heat Therapy   Number Minutes Moist Heat 15 Minutes   Moist Heat Location --  lumbar   Electrical Stimulation   Electrical Stimulation Location lumbar    Electrical Stimulation Action interferential  Electrical Stimulation Parameters 15 minutes   Electrical Stimulation Goals Pain   Manual Therapy   Manual Therapy Myofascial release;Other (comment);Passive ROM  orange roller over bilateral hip flexors   Myofascial Release use of orange roller ball and manual for soft tissue elongation                  PT Short Term Goals - 05/05/14 1004    PT SHORT TERM GOAL #3   Status On-going  6-7/10 pain reported           PT Long Term Goals - 05/05/14 1005    PT LONG TERM GOAL #4   Status On-going  Pt sleeps 2 hours max without waking due to pain               Plan - 05/05/14 0940    Clinical Impression Statement Pt tolerates all exercise well in the clinic without pain  limiting advancement.    Pt will benefit from skilled therapeutic intervention in order to improve on the following deficits Impaired flexibility;Pain;Improper body mechanics;Decreased mobility;Decreased activity tolerance;Decreased endurance;Decreased strength;Difficulty walking   Rehab Potential Good   PT Treatment/Interventions ADLs/Self Care Home Management;Electrical Stimulation;Therapeutic exercise;Patient/family education;Moist Heat;Neuromuscular re-education;Therapeutic activities   PT Next Visit Plan Continue hip and core strength and hip flexibility.  Modalities and manual PRN   Consulted and Agree with Plan of Care Patient        Problem List Patient Active Problem List   Diagnosis Date Noted  . Preoperative cardiovascular examination 04/22/2014  . PAF (paroxysmal atrial fibrillation) 04/22/2014  . Long term current use of antiarrhythmic drug 04/22/2014  . History of uterine cancer 04/22/2014  . Osteoarthritis, hip, bilateral 03/25/2014  . Lumbar and sacral osteoarthritis 12/10/2013    Tayonna Bacha, PT  05/05/2014, 10:21 AM  [facility] [address] [place], [place], [place], [address] Phone: [phone]   Fax:  [phone]

## 2014-05-07 ENCOUNTER — Ambulatory Visit: Payer: Medicare Other

## 2014-05-07 DIAGNOSIS — I10 Essential (primary) hypertension: Secondary | ICD-10-CM | POA: Diagnosis not present

## 2014-05-07 DIAGNOSIS — I4891 Unspecified atrial fibrillation: Secondary | ICD-10-CM | POA: Diagnosis not present

## 2014-05-07 DIAGNOSIS — M25559 Pain in unspecified hip: Secondary | ICD-10-CM

## 2014-05-07 DIAGNOSIS — M4710 Other spondylosis with myelopathy, site unspecified: Secondary | ICD-10-CM | POA: Diagnosis not present

## 2014-05-07 DIAGNOSIS — M545 Low back pain, unspecified: Secondary | ICD-10-CM

## 2014-05-07 DIAGNOSIS — Z85820 Personal history of malignant melanoma of skin: Secondary | ICD-10-CM | POA: Diagnosis not present

## 2014-05-07 DIAGNOSIS — M25659 Stiffness of unspecified hip, not elsewhere classified: Secondary | ICD-10-CM

## 2014-05-07 DIAGNOSIS — K219 Gastro-esophageal reflux disease without esophagitis: Secondary | ICD-10-CM | POA: Diagnosis not present

## 2014-05-07 DIAGNOSIS — R6889 Other general symptoms and signs: Secondary | ICD-10-CM

## 2014-05-07 DIAGNOSIS — M160 Bilateral primary osteoarthritis of hip: Secondary | ICD-10-CM | POA: Diagnosis not present

## 2014-05-07 NOTE — Patient Instructions (Signed)
EXTENSION: Standing (Active)   Stand, both feet flat. Draw right leg behind body as far as possible. Use ___ lbs. Complete __2_ sets of _10__ repetitions. Perform _1-2__ sessions per day.  http://gtsc.exer.us/77   Copyright  VHI. All rights reserved.  ABDUCTION: Standing (Power)   Stand, feet flat. Lift right leg out to side as quickly as possible. Use ___ lbs. Complete 2___ sets of _10__ repetitions. Perform 1-2___ sessions per day.  Copyright  VHI. All rights reserved.

## 2014-05-07 NOTE — Therapy (Signed)
[facility] [address] [place], [place], [address] Phone: [phone]   Fax:  [phone]  Physical Therapy Treatment  Patient Details  Name: [name] MRN: [id number] Date of Birth: [birth year] Referring Provider:  Bartholome Bill, MD  Encounter Date: 05/07/2014      PT End of Session - 05/07/14 0927    Visit Number 8   Number of Visits --  10 medicare   Date for PT Re-Evaluation 06/02/14   PT Start Time 0848   PT Stop Time 0945   PT Time Calculation (min) 57 min   Activity Tolerance Patient tolerated treatment well   Behavior During Therapy [facility] for tasks assessed/performed      Past Medical History  Diagnosis Date  . Hypertension   . Swelling of both lower extremities   . Atrial fibrillation   . GERD (gastroesophageal reflux disease)   . Melanoma   . Skin cancer     Past Surgical History  Procedure Laterality Date  . Abdominal hysterectomy  09/10/2013  . Melanoma excision  12/2011  . Vein surgery  05/2011    venous ablation   . Bunionectomy  10/1976  . Fracture surgery  09/1964    There were no vitals taken for this visit.  Visit Diagnosis:  Hip stiffness, unspecified laterality  Hip pain, unspecified laterality  Low back pain associated with a spinal disorder other than radiculopathy or spinal stenosis  Activity intolerance      Subjective Assessment - 05/07/14 0854    Symptoms My Lt thigh was sore after manual therapy last session.  Feels better now.  Pt leaving for a trip to [place] tomorrow.     Currently in Pain? 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 (3/3) seated on green ball   Lumbar Exercises: Standing   Other Standing Lumbar Exercises mini tramp: weight shifting with abdominal bracing 3 waysx 1 min. each   Knee/Hip Exercises: Stretches   Active Hamstring Stretch 3 reps;20 seconds  seated on mat table   Hip Flexor Stretch 3 reps;20 seconds  Edge of the mat   Knee/Hip Exercises: Standing   Other Standing Knee Exercises standing hip abduction and extension with abdominal bracing 2x10   Electrical Stimulation   Electrical Stimulation Location lumbar    Electrical Stimulation Action interferential   Electrical Stimulation Parameters 15 min   Electrical Stimulation Goals Pain   Manual Therapy   Manual  Therapy Other (comment);Myofascial release   Myofascial Release use of orange roller ball for and manual for soft tissue elongation                PT Education - 05/07/14 0909    Education provided Yes   Education Details standing hip abduction and extension   Person(s) Educated Patient   Methods Explanation;Demonstration;Verbal cues;Handout   Comprehension Verbalized understanding;Returned demonstration          PT Short Term Goals - 05/05/14 1004    PT SHORT TERM GOAL #3   Status On-going  6-7/10 pain reported           PT Long Term Goals - 05/05/14 1005    PT LONG TERM GOAL #4   Status On-going  Pt sleeps 2 hours max without waking due to pain               Plan - 05/07/14 0928    Clinical  Impression Statement Pt tolerated new standing exercises well.     Pt will benefit from skilled therapeutic intervention in order to improve on the following deficits Impaired flexibility;Pain;Improper body mechanics;Decreased mobility;Decreased activity tolerance;Decreased endurance;Decreased strength;Difficulty walking   Rehab Potential Good   PT Frequency 2x / week   PT Duration 8 weeks   PT Treatment/Interventions ADLs/Self Care Home Management;Electrical Stimulation;Therapeutic exercise;Patient/family education;Moist Heat;Neuromuscular re-education;Therapeutic activities   PT Next Visit Plan Review new HEP, core strength, hip flexibility, modalities   PT Home Exercise Plan Review new HEP        Problem List Patient Active Problem List   Diagnosis Date Noted  . Preoperative cardiovascular examination 04/22/2014  . PAF (paroxysmal atrial fibrillation) 04/22/2014  . Long term current use of antiarrhythmic drug 04/22/2014  . History of uterine cancer 04/22/2014  . Osteoarthritis, hip, bilateral 03/25/2014  . Lumbar and sacral osteoarthritis 12/10/2013    TAKACS,KELLY , PT  05/07/2014, 9:33 AM  [facility] [address], [place], [place], [address] Phone: [phone]   Fax:  [phone]

## 2014-05-08 ENCOUNTER — Ambulatory Visit: Payer: Self-pay

## 2014-05-08 ENCOUNTER — Ambulatory Visit: Payer: Medicare Other | Admitting: Physical Medicine & Rehabilitation

## 2014-05-12 ENCOUNTER — Ambulatory Visit: Payer: Medicare Other

## 2014-05-12 DIAGNOSIS — I4891 Unspecified atrial fibrillation: Secondary | ICD-10-CM | POA: Diagnosis not present

## 2014-05-12 DIAGNOSIS — R6889 Other general symptoms and signs: Secondary | ICD-10-CM

## 2014-05-12 DIAGNOSIS — Z85820 Personal history of malignant melanoma of skin: Secondary | ICD-10-CM | POA: Diagnosis not present

## 2014-05-12 DIAGNOSIS — M25559 Pain in unspecified hip: Secondary | ICD-10-CM

## 2014-05-12 DIAGNOSIS — M25659 Stiffness of unspecified hip, not elsewhere classified: Secondary | ICD-10-CM

## 2014-05-12 DIAGNOSIS — I10 Essential (primary) hypertension: Secondary | ICD-10-CM | POA: Diagnosis not present

## 2014-05-12 DIAGNOSIS — M160 Bilateral primary osteoarthritis of hip: Secondary | ICD-10-CM | POA: Diagnosis not present

## 2014-05-12 DIAGNOSIS — K219 Gastro-esophageal reflux disease without esophagitis: Secondary | ICD-10-CM | POA: Diagnosis not present

## 2014-05-12 DIAGNOSIS — M4710 Other spondylosis with myelopathy, site unspecified: Secondary | ICD-10-CM | POA: Diagnosis not present

## 2014-05-12 DIAGNOSIS — M545 Low back pain, unspecified: Secondary | ICD-10-CM

## 2014-05-12 NOTE — Therapy (Signed)
[facility] [address], [place], [place], [address] Phone: [phone]   Fax:  [phone]  Physical Therapy Treatment  Patient Details  Name: [name] MRN: [id number] Date of Birth: 1950/08/17 Referring Provider:  Bartholome Bill, MD  Encounter Date: 05/12/2014      PT End of Session - 05/12/14 0905    Visit Number 9   Number of Visits --  10 Medicare   Date for PT Re-Evaluation 06/02/14   PT Start Time 0846   PT Stop Time 0949   PT Time Calculation (min) 63 min   Activity Tolerance Patient tolerated treatment well   Behavior During Therapy [facility] for tasks assessed/performed      Past Medical History  Diagnosis Date  . Hypertension   . Swelling of both lower extremities   . Atrial fibrillation   . GERD (gastroesophageal reflux disease)   . Melanoma   . Skin cancer     Past Surgical History  Procedure Laterality Date  . Abdominal hysterectomy  09/10/2013  . Melanoma excision  12/2011  . Vein surgery  05/2011    venous ablation   . Bunionectomy  10/1976  . Fracture surgery  09/1964    There were no vitals taken for this visit.  Visit Diagnosis:  Hip stiffness, unspecified laterality  Hip pain, unspecified laterality  Low back pain associated with a spinal disorder other than radiculopathy or spinal stenosis  Activity intolerance      Subjective Assessment - 05/12/14 0856    Symptoms Pt traveled by plane to [place] over the weekend.  Feeling stiff today.  Pt was very busy and walked on the beach.   Currently in Pain? Yes   Pain Score 6    Pain Location Back   Pain Orientation Lower;Left   Pain Descriptors / Indicators Tightness;Sore;Aching   Pain Type Chronic pain   Pain Onset More than a month ago   Pain Frequency Intermittent   Aggravating Factors  standing and walking   Effect of Pain on Daily Activities limited standing and walking   Pain Score 6   Pain Type Chronic pain   Pain Location Hip   Pain Orientation Left   Pain Descriptors / Indicators Shooting;Spasm;Aching   Pain Frequency Intermittent   Pain Onset With Activity          [facility] PT Assessment - 05/12/14 0001    Observation/Other Assessments   Focus on Therapeutic Outcomes (FOTO)  56% limitation                  [facility] Adult PT Treatment/Exercise - 05/12/14 0001    Lumbar Exercises: Aerobic   Stationary Bike Level 2x 9 minutes   UBE (Upper Arm Bike) Level 1x 6 min. (3/3) seated on green ball   Lumbar Exercises: Standing   Other Standing Lumbar Exercises mini tramp: weight shifting with abdominal bracing 3 waysx 1 min. each   Knee/Hip Exercises: Stretches   Active Hamstring Stretch 3 reps;20 seconds  seated on mat table   Hip Flexor Stretch 3 reps;20 seconds  Edge of the mat. PT provided overpressure   Knee/Hip Exercises: Standing   Other Standing Knee Exercises standing hip abduction and extension with abdominal bracing 2x10  Pt with good return demonstration of HEP   Moist Heat Therapy   Number Minutes Moist Heat 15 Minutes   Moist Heat Location --  lumbar   Electrical Stimulation   Electrical Stimulation Location lumbar    Electrical  Stimulation Action interferential   Electrical Stimulation Parameters 15 min   Electrical Stimulation Goals Pain                  PT Short Term Goals - 05/12/14 0902    PT SHORT TERM GOAL #3   Title ambulate in a store with cart with moderate pain   Time 4   Period Weeks   Status On-going  6/10 reported           PT Long Term Goals - 05/12/14 0903    PT LONG TERM GOAL #1   Title demonstrate understanding of posture and body mechanics   Status Achieved   PT LONG TERM GOAL #2   Title be independent in advanced HEP for lumbar strength   Time 8   Period Weeks   Status On-going  Independent and compliant with current HEP   PT LONG TERM GOAL #4   Title sleep for 6 hours with miminal to moderate pain   Time 8   Period  Weeks   Status On-going  2 hours max still               Plan - 05/12/14 0910    Pt will benefit from skilled therapeutic intervention in order to improve on the following deficits Impaired flexibility;Pain;Improper body mechanics;Decreased mobility;Decreased activity tolerance;Decreased endurance;Decreased strength;Difficulty walking   Rehab Potential Good   PT Frequency 2x / week   PT Duration 8 weeks   PT Treatment/Interventions ADLs/Self Care Home Management;Electrical Stimulation;Therapeutic exercise;Patient/family education;Moist Heat;Neuromuscular re-education;Therapeutic activities   PT Next Visit Plan G-codes (FOTO done today), core strength, modalities, hip flexibility   Consulted and Agree with Plan of Care Patient        Problem List Patient Active Problem List   Diagnosis Date Noted  . Preoperative cardiovascular examination 04/22/2014  . PAF (paroxysmal atrial fibrillation) 04/22/2014  . Long term current use of antiarrhythmic drug 04/22/2014  . History of uterine cancer 04/22/2014  . Osteoarthritis, hip, bilateral 03/25/2014  . Lumbar and sacral osteoarthritis 12/10/2013    Rifka Ramey, PT 05/12/2014, 9:45 AM  [facility] [address] [place], [place], [address] Phone: [phone]   Fax:  [phone]

## 2014-05-13 ENCOUNTER — Encounter: Payer: Self-pay | Admitting: Physical Medicine & Rehabilitation

## 2014-05-13 ENCOUNTER — Ambulatory Visit (HOSPITAL_BASED_OUTPATIENT_CLINIC_OR_DEPARTMENT_OTHER): Payer: Medicare Other | Admitting: Physical Medicine & Rehabilitation

## 2014-05-13 ENCOUNTER — Encounter: Payer: Medicare Other | Attending: Physical Medicine & Rehabilitation

## 2014-05-13 VITALS — BP 133/65 | HR 72 | Resp 14

## 2014-05-13 DIAGNOSIS — M47817 Spondylosis without myelopathy or radiculopathy, lumbosacral region: Secondary | ICD-10-CM | POA: Diagnosis not present

## 2014-05-13 DIAGNOSIS — Z79899 Other long term (current) drug therapy: Secondary | ICD-10-CM | POA: Insufficient documentation

## 2014-05-13 DIAGNOSIS — G894 Chronic pain syndrome: Secondary | ICD-10-CM | POA: Insufficient documentation

## 2014-05-13 DIAGNOSIS — M4710 Other spondylosis with myelopathy, site unspecified: Secondary | ICD-10-CM | POA: Diagnosis not present

## 2014-05-13 DIAGNOSIS — M24552 Contracture, left hip: Secondary | ICD-10-CM | POA: Diagnosis not present

## 2014-05-13 DIAGNOSIS — M160 Bilateral primary osteoarthritis of hip: Secondary | ICD-10-CM | POA: Diagnosis not present

## 2014-05-13 DIAGNOSIS — Z5181 Encounter for therapeutic drug level monitoring: Secondary | ICD-10-CM | POA: Insufficient documentation

## 2014-05-13 DIAGNOSIS — M24559 Contracture, unspecified hip: Secondary | ICD-10-CM | POA: Diagnosis not present

## 2014-05-13 MED ORDER — TRAMADOL HCL 50 MG PO TABS: 50.0000 mg | ORAL_TABLET | Freq: Three times a day (TID) | ORAL | Status: DC | PRN

## 2014-05-13 MED ORDER — GABAPENTIN 600 MG PO TABS: 600.0000 mg | ORAL_TABLET | Freq: Three times a day (TID) | ORAL | Status: DC

## 2014-05-13 NOTE — Progress Notes (Signed)
Left L5 dorsal ramus., left L4 and left L3 medial branch radio frequency neuropathy under fluoroscopic guidance  Indication: Low back pain due to lumbar spondylosis which has been relieved on 2 occasions by greater than 50% by lumbar medial branch blocks at corresponding levels.  Informed consent was obtained after describing risks and benefits of the procedure with the patient, this includes bleeding, bruising, infection, paralysis and medication side effects. The patient wishes to proceed and has given written consent. The patient was placed in a prone position. The lumbar and sacral area was marked and prepped with Betadine. A 25-gauge 1-1/2 inch needle was inserted into the skin and subcutaneous tissue at 3 sites in one ML of 1% lidocaine was injected into each site. Then a 20-gauge 10cm cm radio frequency needle with a 1 cm curved active tip was inserted targeting the left S1 SAP/sacral ala junction. Bone contact was made and confirmed with lateral imaging. Sensory stimulation at 50 Hz followed by motor stimulation at 2 Hz confirm proper needle location followed by injection of one ML of the solution containing one ML of 4 mg per mL dexamethasone and 3 mL of 1% MPF lidocaine. Then the left L5 SAP/transverse process junction was targeted. Bone contact was made and confirmed with lateral imaging. Sensory stimulation at 50 Hz followed by motor stimulation at 2 Hz confirm proper needle location followed by injection of one ML of the solution containing one ML of 4 mg per mL dexamethasone and 3 mL of 1% MPF lidocaine. Then the left L4 SAP/transverse process junction was targeted. Bone contact was made and confirmed with lateral imaging. Sensory stimulation at 50 Hz followed by motor stimulation at 2 Hz confirm proper needle location followed by injection of one ML of the solution containing one ML of 4 mg per mL dexamethasone and 3 mL of 1% MPF lidocaine. Radio frequency lesion being at [facility] for 90 seconds was  performed. Needles were removed. Post procedure instructions and vital signs were performed. Patient tolerated procedure well. Followup appointment was given.

## 2014-05-13 NOTE — Progress Notes (Signed)
  [facility]   Name: [name] DOB:[birth year] MRN: [id number]  Date:05/13/2014  Physician: Alysia Penna, MD    Nurse/CMA: Mancel Parsons, CMA  Allergies:  Allergies  Allergen Reactions  . Hydrolyzed Silk Hives    Silk tape-blisters    Consent Signed: Yes.    Is patient diabetic? No.  CBG today?   Pregnant: No. LMP: No LMP recorded. (age 65-55)  Anticoagulants: no Anti-inflammatory: yes (ibuprofen, early this morning) Antibiotics: no  Procedure: L3,4,5 Radiofrequency neurotomy  Position: Prone Start Time: 2:00 PM  End Time: 2:23 PM Fluoro Time:52  RN/CMA Rolan Bucco Josie Burleigh    Time 1:35 pm 2:31 PM    BP 133/65 130/58    Pulse 72 70    Respirations 14 14    O2 Sat 97 97    S/S 6 6    Pain Level 5/10 5/10     D/C home with daughter [name], patient A & O X 3, D/C instructions reviewed, and sits independently.

## 2014-05-13 NOTE — Patient Instructions (Signed)

## 2014-05-15 ENCOUNTER — Ambulatory Visit: Payer: Medicare Other | Attending: Physical Medicine & Rehabilitation

## 2014-05-15 DIAGNOSIS — I4891 Unspecified atrial fibrillation: Secondary | ICD-10-CM | POA: Insufficient documentation

## 2014-05-15 DIAGNOSIS — Z85820 Personal history of malignant melanoma of skin: Secondary | ICD-10-CM | POA: Diagnosis not present

## 2014-05-15 DIAGNOSIS — M419 Scoliosis, unspecified: Secondary | ICD-10-CM | POA: Insufficient documentation

## 2014-05-15 DIAGNOSIS — Z79899 Other long term (current) drug therapy: Secondary | ICD-10-CM | POA: Diagnosis not present

## 2014-05-15 DIAGNOSIS — M160 Bilateral primary osteoarthritis of hip: Secondary | ICD-10-CM | POA: Diagnosis not present

## 2014-05-15 DIAGNOSIS — I10 Essential (primary) hypertension: Secondary | ICD-10-CM | POA: Diagnosis not present

## 2014-05-15 DIAGNOSIS — M4710 Other spondylosis with myelopathy, site unspecified: Secondary | ICD-10-CM | POA: Diagnosis not present

## 2014-05-15 DIAGNOSIS — R6889 Other general symptoms and signs: Secondary | ICD-10-CM

## 2014-05-15 DIAGNOSIS — M25559 Pain in unspecified hip: Secondary | ICD-10-CM

## 2014-05-15 DIAGNOSIS — M545 Low back pain, unspecified: Secondary | ICD-10-CM

## 2014-05-15 DIAGNOSIS — M25659 Stiffness of unspecified hip, not elsewhere classified: Secondary | ICD-10-CM

## 2014-05-15 DIAGNOSIS — K219 Gastro-esophageal reflux disease without esophagitis: Secondary | ICD-10-CM | POA: Insufficient documentation

## 2014-05-15 NOTE — Therapy (Signed)
[facility] [address], [place], [place], [address] Phone: [phone]   Fax:  [phone]  Physical Therapy Treatment  Patient Details  Name: [name] MRN: [id number] Date of Birth: [birth year] Referring Provider:  Bartholome Bill, MD  Encounter Date: 05/15/2014      PT End of Session - 05/15/14 0924    Visit Number 10   Number of Visits 20  Medicare   PT Start Time 6644   PT Stop Time 0946   PT Time Calculation (min) 62 min   Activity Tolerance Patient tolerated treatment well   Behavior During Therapy [facility] for tasks assessed/performed      Past Medical History  Diagnosis Date  . Hypertension   . Swelling of both lower extremities   . Atrial fibrillation   . GERD (gastroesophageal reflux disease)   . Melanoma   . Skin cancer     Past Surgical History  Procedure Laterality Date  . Abdominal hysterectomy  09/10/2013  . Melanoma excision  12/2011  . Vein surgery  05/2011    venous ablation   . Bunionectomy  10/1976  . Fracture surgery  09/1964    There were no vitals taken for this visit.  Visit Diagnosis:  Hip stiffness, unspecified laterality  Hip pain, unspecified laterality  Low back pain associated with a spinal disorder other than radiculopathy or spinal stenosis  Activity intolerance      Subjective Assessment - 05/15/14 0852    Symptoms Pt had Lt nerve root ablasion on Tuesday and feels like it is helping   Currently in Pain? Yes   Pain Score 4    Pain Location Back   Pain Orientation Left   Pain Descriptors / Indicators Aching;Sore;Tightness   Pain Type Chronic pain                    [facility] Adult PT Treatment/Exercise - 05/15/14 0001    Lumbar Exercises: Aerobic   Stationary Bike Level 3 x 9 minutes  Pt tolerated increased resistance well   UBE (Upper Arm Bike) Level 1x 6 min. (3/3) seated on green ball   Lumbar Exercises: Machines for Strengthening   Leg  Press bilateral 65# 3x10  seat #7, incline #3   Lumbar Exercises: Standing   Other Standing Lumbar Exercises mini tramp: weight shifting with abdominal bracing 3 waysx 1 min. each   Lumbar Exercises: Supine   Bridge 5 seconds;15 reps   Knee/Hip Exercises: Stretches   Hip Flexor Stretch 3 reps;20 seconds  Edge of the mat. PT provided overpressure   Modalities   Modalities Electrical Stimulation   Moist Heat Therapy   Number Minutes Moist Heat 15 Minutes   Moist Heat Location --  lumbar   Electrical Stimulation   Electrical Stimulation Location lumbar    Electrical Stimulation Action interferential   Electrical Stimulation Parameters 15   Electrical Stimulation Goals Pain   Manual Therapy   Manual Therapy Myofascial release   Myofascial Release use of orange roller ball over bilateral quad and hip flexors while patient in hip flexor stretch with foot on 6" step.  overpressue also performed by PT.                  PT Short Term Goals - 05/12/14 0902    PT SHORT TERM GOAL #3   Title ambulate in a store with cart with moderate pain   Time 4   Period Weeks   Status  On-going  6/10 reported           PT Long Term Goals - 05/12/14 0903    PT LONG TERM GOAL #1   Title demonstrate understanding of posture and body mechanics   Status Achieved   PT LONG TERM GOAL #2   Title be independent in advanced HEP for lumbar strength   Time 8   Period Weeks   Status On-going  Independent and compliant with current HEP   PT LONG TERM GOAL #4   Title sleep for 6 hours with miminal to moderate pain   Time 8   Period Weeks   Status On-going  2 hours max still               Plan - 2014/06/02 0925    Clinical Impression Statement Pt tolerated leg press well today.  Pt with increased ease of movement today after Lt nerve root ablasion.     Pt will benefit from skilled therapeutic intervention in order to improve on the following deficits Impaired flexibility;Pain;Improper  body mechanics;Decreased mobility;Decreased activity tolerance;Decreased endurance;Decreased strength;Difficulty walking   Rehab Potential Good   PT Frequency 2x / week   PT Duration 8 weeks   PT Treatment/Interventions ADLs/Self Care Home Management;Electrical Stimulation;Therapeutic exercise;Patient/family education;Moist Heat;Neuromuscular re-education;Therapeutic activities   PT Next Visit Plan Core strength, endurance tasks, hip strength and flexibility   Consulted and Agree with Plan of Care Patient          G-Codes - 02-Jun-2014 0849    Functional Assessment Tool Used FOTO 56% limitation   Functional Limitation Mobility: Walking and moving around   Mobility: Walking and Moving Around Current Status [phone]) At least 40 percent but less than 60 percent impaired, limited or restricted   Mobility: Walking and Moving Around Goal Status [phone]) At least 40 percent but less than 60 percent impaired, limited or restricted      Problem List Patient Active Problem List   Diagnosis Date Noted  . Preoperative cardiovascular examination 04/22/2014  . PAF (paroxysmal atrial fibrillation) 04/22/2014  . Long term current use of antiarrhythmic drug 04/22/2014  . History of uterine cancer 04/22/2014  . Osteoarthritis, hip, bilateral 03/25/2014  . Lumbar and sacral osteoarthritis 12/10/2013    TAKACS,KELLY, PT Jun 02, 2014, 9:30 AM  [facility] [address], [place], [place], [address] Phone: [phone]   Fax:  [phone]

## 2014-05-21 ENCOUNTER — Ambulatory Visit: Payer: Medicare Other

## 2014-05-21 DIAGNOSIS — I10 Essential (primary) hypertension: Secondary | ICD-10-CM | POA: Diagnosis not present

## 2014-05-21 DIAGNOSIS — M419 Scoliosis, unspecified: Secondary | ICD-10-CM | POA: Diagnosis not present

## 2014-05-21 DIAGNOSIS — M160 Bilateral primary osteoarthritis of hip: Secondary | ICD-10-CM | POA: Diagnosis not present

## 2014-05-21 DIAGNOSIS — R6889 Other general symptoms and signs: Secondary | ICD-10-CM

## 2014-05-21 DIAGNOSIS — M25659 Stiffness of unspecified hip, not elsewhere classified: Secondary | ICD-10-CM

## 2014-05-21 DIAGNOSIS — K219 Gastro-esophageal reflux disease without esophagitis: Secondary | ICD-10-CM | POA: Diagnosis not present

## 2014-05-21 DIAGNOSIS — M25559 Pain in unspecified hip: Secondary | ICD-10-CM

## 2014-05-21 DIAGNOSIS — Z79899 Other long term (current) drug therapy: Secondary | ICD-10-CM | POA: Diagnosis not present

## 2014-05-21 DIAGNOSIS — M4710 Other spondylosis with myelopathy, site unspecified: Secondary | ICD-10-CM | POA: Diagnosis not present

## 2014-05-21 NOTE — Therapy (Signed)
[facility] [address], [place], [place], [address] Phone: [phone]   Fax:  [phone]  Physical Therapy Treatment  Patient Details  Name: [name] MRN: [id number] Date of Birth: 05-20-1950 Referring Provider:  Bartholome Bill, MD  Encounter Date: 05/21/2014      PT End of Session - 05/21/14 0922    Visit Number 11   Number of Visits 20   Date for PT Re-Evaluation 06/02/14  Medicare   PT Start Time 0846   PT Stop Time 0944   PT Time Calculation (min) 58 min   Activity Tolerance Patient tolerated treatment well   Behavior During Therapy [facility] for tasks assessed/performed      Past Medical History  Diagnosis Date  . Hypertension   . Swelling of both lower extremities   . Atrial fibrillation   . GERD (gastroesophageal reflux disease)   . Melanoma   . Skin cancer     Past Surgical History  Procedure Laterality Date  . Abdominal hysterectomy  09/10/2013  . Melanoma excision  12/2011  . Vein surgery  05/2011    venous ablation   . Bunionectomy  10/1976  . Fracture surgery  09/1964    There were no vitals taken for this visit.  Visit Diagnosis:  Hip stiffness, unspecified laterality  Hip pain, unspecified laterality  Activity intolerance      Subjective Assessment - 05/21/14 0853    Symptoms Pt reports that hip is not getting better-surgery for THA scheduled for 06/13/14   Currently in Pain? 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 each   Lumbar Exercises: Supine   Bridge 5 seconds;15 reps   Knee/Hip Exercises: Stretches   Hip Flexor Stretch 3 reps;20 seconds  No stool under foot today   Knee/Hip Exercises: Standing   Forward Step Up Right;2 sets;10 reps   Walking with Sports Cord 20# forward and reverse with upright posture x 10 each   Other Standing Knee Exercises standing hip abduction and extension with abdominal bracing 2x10  PT emphasized the importance of doing this 1-2 times a day   Modalities   Modalities Electrical Stimulation   Moist Heat Therapy   Number Minutes Moist Heat 15 Minutes   Moist Heat Location Other (comment)  lumbar   Electrical Stimulation   Electrical Stimulation Location lumbar    Electrical Stimulation Action interferential   Electrical Stimulation Parameters 15   Electrical Stimulation Goals  Pain                  PT Short Term Goals - 05/21/14 0855    PT SHORT TERM GOAL #3   Title ambulate in a store with cart with moderate pain   Status On-going  4-6/10 reported           PT Long Term Goals - 05/21/14 0857    PT LONG TERM GOAL #1   Title demonstrate understanding of posture and body mechanics   Status Achieved  Pt continues to use modifications to improve mechanics   PT LONG TERM GOAL #2   Title be independent in advanced HEP for lumbar strength   Status On-going  Pt is compliant with advanced HEP   PT LONG TERM GOAL #4   Title sleep for 6 hours with miminal to moderate pain   Period Weeks   Status On-going  No change in sleep.  Not related to  pain               Plan - 05/21/14 0908    Clinical Impression Statement Pt able to perform more weight bearing function today.  Progress toward goals limited by Lt hip pain.  Pt to have hip replacement 06/13/14.  PT will continue to address LE strength to improve endurance and strength to improve outcomes after surgery.   Pt will benefit from skilled therapeutic intervention in order to improve on the following deficits Impaired flexibility;Pain;Improper body mechanics;Decreased mobility;Decreased activity tolerance;Decreased endurance;Decreased strength;Difficulty walking   Rehab Potential Good   PT Duration 8 weeks   PT Treatment/Interventions ADLs/Self Care Home Management;Electrical Stimulation;Therapeutic exercise;Patient/family education;Moist Heat;Neuromuscular re-education;Therapeutic activities   PT Next Visit Plan Core strength, endurance tasks, hip strength and flexibility   Consulted and Agree with Plan of Care Patient        Problem List Patient Active Problem List   Diagnosis Date Noted  . Preoperative cardiovascular examination 04/22/2014  . PAF (paroxysmal atrial fibrillation) 04/22/2014  . Long term current use of antiarrhythmic drug 04/22/2014  . History of uterine cancer 04/22/2014  . Osteoarthritis, hip, bilateral 03/25/2014  . Lumbar and sacral osteoarthritis 12/10/2013    TAKACS,KELLY, PT 05/21/2014, 9:26 AM   [facility] [address] [place], [place], [address] Phone: [phone]   Fax:  [phone]

## 2014-05-22 DIAGNOSIS — R87610 Atypical squamous cells of undetermined significance on cytologic smear of cervix (ASC-US): Secondary | ICD-10-CM | POA: Diagnosis not present

## 2014-05-22 DIAGNOSIS — C55 Malignant neoplasm of uterus, part unspecified: Secondary | ICD-10-CM | POA: Diagnosis not present

## 2014-05-23 ENCOUNTER — Ambulatory Visit: Payer: Medicare Other | Admitting: Physical Therapy

## 2014-05-23 ENCOUNTER — Encounter: Payer: Self-pay | Admitting: Physical Therapy

## 2014-05-23 DIAGNOSIS — M545 Low back pain, unspecified: Secondary | ICD-10-CM

## 2014-05-23 DIAGNOSIS — M25559 Pain in unspecified hip: Secondary | ICD-10-CM

## 2014-05-23 DIAGNOSIS — R6889 Other general symptoms and signs: Secondary | ICD-10-CM

## 2014-05-23 DIAGNOSIS — M25659 Stiffness of unspecified hip, not elsewhere classified: Secondary | ICD-10-CM

## 2014-05-23 NOTE — Patient Instructions (Addendum)
On Elbows (Prone)  Rise up on elbows as high as possible, keeping hips on floor. Hold 1-5 minutes  Do _3-5___ sessions per day.  Press-Up  Press upper body upward, keeping hips in contact with floor. Keep lower back and buttocks relaxed. Hold _3-5___ seconds. Repeat ____ times per set. Do ____ sessions per day.                     Backward Bend (Standing)   Arch backward to make hollow of back deeper. Hold _5___ seconds. Repeat __10__ times per set. Do ____ sessions per day. Copyright  [employer]. All rights reserved.    If you have back pain and the pain travels to your hips and/or legs, please STOP the exercises  If you already have leg pain and the leg pain gets worse, STOP and let your therapist know at your next visit  Occasionally, when you abolish leg pain, you may have a temporary increase in low back pain.  This can be a normal reaction and the back pain should eventually get better.  However, if the pain is too significant to exercise, please STOP the exercises and let your therapist know at your next visit.

## 2014-05-23 NOTE — Therapy (Signed)
[facility] [address] [place], [place], [address] Phone: [phone]   Fax:  [phone]  Physical Therapy Treatment  Patient Details  Name: [name] MRN: [id number] Date of Birth: 04-Dec-1950 Referring Provider:  Charlett Blake, MD  Encounter Date: 05/23/2014      PT End of Session - 05/23/14 0817    Visit Number 12   Number of Visits 20  Medicare   Date for PT Re-Evaluation 06/02/14   PT Start Time 0801   PT Stop Time 0845   PT Time Calculation (min) 44 min   Activity Tolerance Patient tolerated treatment well   Behavior During Therapy [facility] for tasks assessed/performed      Past Medical History  Diagnosis Date  . Hypertension   . Swelling of both lower extremities   . Atrial fibrillation   . GERD (gastroesophageal reflux disease)   . Melanoma   . Skin cancer     Past Surgical History  Procedure Laterality Date  . Abdominal hysterectomy  09/10/2013  . Melanoma excision  12/2011  . Vein surgery  05/2011    venous ablation   . Bunionectomy  10/1976  . Fracture surgery  09/1964    There were no vitals filed for this visit.  Visit Diagnosis:  Hip stiffness, unspecified laterality  Hip pain, unspecified laterality  Activity intolerance  Low back pain associated with a spinal disorder other than radiculopathy or spinal stenosis      Subjective Assessment - 05/23/14 0806    Symptoms Pt reports had Gynakology exam what was difficult due to restricted hip ROM, THA surgery scheduled for 06/17/14   Limitations Standing   How long can you stand comfortably? standing is harder than walking, pt reports she is able to stand for 32min, except she can lean on   Currently in Pain? Yes   Pain Score 4    Pain Location Hip   Pain Orientation Left   Pain Descriptors / Indicators Aching;Tightness;Sore   Pain Type Chronic pain   Pain Onset More than a month ago   Pain Frequency Intermittent   Aggravating Factors  weightbearing   Pain Relieving Factors meds, rest, heat, sitting   Effect of Pain on Daily Activities limited standing and walking   Multiple Pain Sites No                       [facility] Adult PT Treatment/Exercise - 05/23/14 0001    Lumbar Exercises: Stretches   Prone on Elbows Stretch 3 reps;20 seconds   Prone Mid Back Stretch 2 reps;Other (comment)  Pt advised to practice a home up to 59min, daily   Lumbar Exercises: Aerobic   Stationary Bike Level 4 x 17minutes  Pt tolerated increased resistance well   Lumbar Exercises: Machines for Strengthening   Leg Press B LE 70# 3x10   Lumbar Exercises: Standing   Other Standing Lumbar Exercises mini tramp: weight shifting with abdominal bracing 3 waysx 1 min. each   Knee/Hip Exercises: Standing   Walking with Sports Cord 25# forward/reverse backward/reverse, 20# sidestepping Lt & Rt  x 10 each                PT Education - 05/23/14 0943    Education provided Yes   Education Details Prone on forearms for gentle hip stretch   Person(s) Educated Patient   Methods Demonstration;Tactile cues;Verbal cues;Handout   Comprehension Returned demonstration;Verbal cues required;Tactile cues required  PT Short Term Goals - 05/21/14 0855    PT SHORT TERM GOAL #3   Title ambulate in a store with cart with moderate pain   Status On-going  4-6/10 reported           PT Long Term Goals - 05/21/14 0857    PT LONG TERM GOAL #1   Title demonstrate understanding of posture and body mechanics   Status Achieved  Pt continues to use modifications to improve mechanics   PT LONG TERM GOAL #2   Title be independent in advanced HEP for lumbar strength   Status On-going  Pt is compliant with advanced HEP   PT LONG TERM GOAL #4   Title sleep for 6 hours with miminal to moderate pain   Period Weeks   Status On-going  No change in sleep.  Not related to pain               Plan - 05/23/14  0822    Clinical Impression Statement Pt continues to improve with weight bearing activities. Pt has THA scheduled for 06/17/14.    Pt will benefit from skilled therapeutic intervention in order to improve on the following deficits Impaired flexibility;Pain;Improper body mechanics;Decreased mobility;Decreased activity tolerance;Decreased endurance;Decreased strength;Difficulty walking   Rehab Potential Good   PT Frequency 2x / week   PT Duration 8 weeks   PT Treatment/Interventions ADLs/Self Care Home Management;Electrical Stimulation;Therapeutic exercise;Patient/family education;Moist Heat;Neuromuscular re-education;Therapeutic activities   PT Next Visit Plan Core strength, endurance tasks, hip strength and flexibility   PT Home Exercise Plan Continue to establish routine for HEP   Consulted and Agree with Plan of Care Patient        Problem List Patient Active Problem List   Diagnosis Date Noted  . Preoperative cardiovascular examination 04/22/2014  . PAF (paroxysmal atrial fibrillation) 04/22/2014  . Long term current use of antiarrhythmic drug 04/22/2014  . History of uterine cancer 04/22/2014  . Osteoarthritis, hip, bilateral 03/25/2014  . Lumbar and sacral osteoarthritis 12/10/2013    NAUMANN-HOUEGNIFIO,Iya Hamed PTA 05/23/2014, 9:54 AM  [facility] [address] [place], [place], [address] Phone: [phone]   Fax:  [phone]

## 2014-05-27 ENCOUNTER — Ambulatory Visit: Payer: Medicare Other

## 2014-05-27 DIAGNOSIS — M545 Low back pain, unspecified: Secondary | ICD-10-CM

## 2014-05-27 DIAGNOSIS — K219 Gastro-esophageal reflux disease without esophagitis: Secondary | ICD-10-CM | POA: Diagnosis not present

## 2014-05-27 DIAGNOSIS — M419 Scoliosis, unspecified: Secondary | ICD-10-CM | POA: Diagnosis not present

## 2014-05-27 DIAGNOSIS — M25559 Pain in unspecified hip: Secondary | ICD-10-CM

## 2014-05-27 DIAGNOSIS — R6889 Other general symptoms and signs: Secondary | ICD-10-CM

## 2014-05-27 DIAGNOSIS — I10 Essential (primary) hypertension: Secondary | ICD-10-CM | POA: Diagnosis not present

## 2014-05-27 DIAGNOSIS — M160 Bilateral primary osteoarthritis of hip: Secondary | ICD-10-CM | POA: Diagnosis not present

## 2014-05-27 DIAGNOSIS — M25659 Stiffness of unspecified hip, not elsewhere classified: Secondary | ICD-10-CM

## 2014-05-27 DIAGNOSIS — Z79899 Other long term (current) drug therapy: Secondary | ICD-10-CM | POA: Diagnosis not present

## 2014-05-27 DIAGNOSIS — M4710 Other spondylosis with myelopathy, site unspecified: Secondary | ICD-10-CM | POA: Diagnosis not present

## 2014-05-27 NOTE — Therapy (Signed)
[facility] [address] [place], [place], [address] Phone: [phone]   Fax:  [phone]  Physical Therapy Treatment  Patient Details  Name: [name] MRN: [id number] Date of Birth: 13-Dec-1950 Referring Provider:  Charlett Blake, MD  Encounter Date: 05/27/2014      PT End of Session - 05/27/14 1009    Visit Number 13   Number of Visits 20  Medicare   Date for PT Re-Evaluation 06/02/14   PT Start Time 0929   PT Stop Time 1021   PT Time Calculation (min) 52 min   Activity Tolerance Patient tolerated treatment well   Behavior During Therapy [facility] for tasks assessed/performed      Past Medical History  Diagnosis Date  . Hypertension   . Swelling of both lower extremities   . Atrial fibrillation   . GERD (gastroesophageal reflux disease)   . Melanoma   . Skin cancer     Past Surgical History  Procedure Laterality Date  . Abdominal hysterectomy  09/10/2013  . Melanoma excision  12/2011  . Vein surgery  05/2011    venous ablation   . Bunionectomy  10/1976  . Fracture surgery  09/1964    There were no vitals filed for this visit.  Visit Diagnosis:  Hip stiffness, unspecified laterality  Hip pain, unspecified laterality  Activity intolerance  Low back pain associated with a spinal disorder other than radiculopathy or spinal stenosis      Subjective Assessment - 05/27/14 0933    Symptoms Pt is having a rough start to the day.  Prone exercises send pain down the Lt leg.   Currently in Pain? Yes   Pain Score 3    Pain Location Hip   Pain Orientation Left   Pain Descriptors / Indicators Aching;Tightness;Sore   Pain Type Chronic pain   Pain Onset More than a month ago   Pain Frequency Intermittent   Aggravating Factors  standing and walking   Pain Relieving Factors meds, rest, heat, sitting   Multiple Pain Sites No                       [facility] Adult PT Treatment/Exercise -  05/27/14 0001    Lumbar Exercises: Aerobic   Stationary Bike Level 4 x 76minutes   Lumbar Exercises: Machines for Strengthening   Leg Press bilateral 75# 3x10  seat #7, incline #3   Lumbar Exercises: Standing   Other Standing Lumbar Exercises mini tramp: weight shifting with abdominal bracing 3 waysx 1 min. each   Other Standing Lumbar Exercises --   Knee/Hip Exercises: Standing   Walking with Sports Cord 25# forward/reverse backward/reverse, 20# sidestepping Lt & Rt  x 10 each   Modalities   Modalities Cryotherapy;Electrical Stimulation   Cryotherapy   Number Minutes Cryotherapy 15 Minutes   Cryotherapy Location Back   Type of Cryotherapy Ice pack   Electrical Stimulation   Electrical Stimulation Location lumbar   Electrical Stimulation Action interferential   Electrical Stimulation Parameters 15   Electrical Stimulation Goals Pain                  PT Short Term Goals - 05/21/14 0855    PT SHORT TERM GOAL #3   Title ambulate in a store with cart with moderate pain   Status On-going  4-6/10 reported           PT Long Term Goals - 05/27/14 0945  PT LONG TERM GOAL #1   Title demonstrate understanding of posture and body mechanics   Status Achieved   PT LONG TERM GOAL #3   Title ambulate in a store with cart with mimimal to moderate pain   Time 8   Period Weeks   Status On-going  4-6/10   PT LONG TERM GOAL #4   Title sleep for 6 hours with miminal to moderate pain   Status On-going  No sleep changes               Plan - 05/27/14 0950    Clinical Impression Statement Pt with increased pain with prone exercises issued last session. Pt with limited progress toward goals secondary to need for THA scheduled in 3 weeks.     Pt will benefit from skilled therapeutic intervention in order to improve on the following deficits Impaired flexibility;Pain;Improper body mechanics;Decreased mobility;Decreased activity tolerance;Decreased endurance;Decreased  strength;Difficulty walking   Rehab Potential Good   PT Frequency 2x / week   PT Duration 8 weeks   PT Treatment/Interventions ADLs/Self Care Home Management;Electrical Stimulation;Therapeutic exercise;Patient/family education;Moist Heat;Neuromuscular re-education;Therapeutic activities   PT Next Visit Plan Core strength, endurance tasks, hip strength and flexibility.  Modalities PRN.  Try standing extension against the wall.   PT Home Exercise Plan review standing extension    Consulted and Agree with Plan of Care Patient        Problem List Patient Active Problem List   Diagnosis Date Noted  . Preoperative cardiovascular examination 04/22/2014  . PAF (paroxysmal atrial fibrillation) 04/22/2014  . Long term current use of antiarrhythmic drug 04/22/2014  . History of uterine cancer 04/22/2014  . Osteoarthritis, hip, bilateral 03/25/2014  . Lumbar and sacral osteoarthritis 12/10/2013    TAKACS,KELLY , PT  05/27/2014, 10:11 AM  [facility] [address] [place], [place], [address] Phone: [phone]   Fax:  [phone]

## 2014-05-30 ENCOUNTER — Encounter: Payer: Self-pay | Admitting: Physical Therapy

## 2014-05-30 ENCOUNTER — Ambulatory Visit: Payer: Medicare Other | Admitting: Physical Therapy

## 2014-05-30 DIAGNOSIS — K219 Gastro-esophageal reflux disease without esophagitis: Secondary | ICD-10-CM | POA: Diagnosis not present

## 2014-05-30 DIAGNOSIS — M25559 Pain in unspecified hip: Secondary | ICD-10-CM

## 2014-05-30 DIAGNOSIS — M160 Bilateral primary osteoarthritis of hip: Secondary | ICD-10-CM | POA: Diagnosis not present

## 2014-05-30 DIAGNOSIS — I10 Essential (primary) hypertension: Secondary | ICD-10-CM | POA: Diagnosis not present

## 2014-05-30 DIAGNOSIS — M419 Scoliosis, unspecified: Secondary | ICD-10-CM | POA: Diagnosis not present

## 2014-05-30 DIAGNOSIS — Z79899 Other long term (current) drug therapy: Secondary | ICD-10-CM | POA: Diagnosis not present

## 2014-05-30 DIAGNOSIS — M25659 Stiffness of unspecified hip, not elsewhere classified: Secondary | ICD-10-CM

## 2014-05-30 DIAGNOSIS — M545 Low back pain, unspecified: Secondary | ICD-10-CM

## 2014-05-30 DIAGNOSIS — M4710 Other spondylosis with myelopathy, site unspecified: Secondary | ICD-10-CM | POA: Diagnosis not present

## 2014-05-30 DIAGNOSIS — R6889 Other general symptoms and signs: Secondary | ICD-10-CM

## 2014-05-30 NOTE — Therapy (Signed)
[facility] [address], [place], [place], [address] Phone: [phone]   Fax:  [phone]  Physical Therapy Treatment  Patient Details  Name: [name] MRN: [id number] Date of Birth: [birth year] Referring Provider:  Charlett Blake, MD  Encounter Date: 05/30/2014      PT End of Session - 05/30/14 1018    Visit Number 14   Number of Visits 20   Date for PT Re-Evaluation 06/02/14   PT Start Time 0928   PT Stop Time 1031   PT Time Calculation (min) 63 min   Activity Tolerance Patient tolerated treatment well   Behavior During Therapy [facility] for tasks assessed/performed      Past Medical History  Diagnosis Date  . Hypertension   . Swelling of both lower extremities   . Atrial fibrillation   . GERD (gastroesophageal reflux disease)   . Melanoma   . Skin cancer     Past Surgical History  Procedure Laterality Date  . Abdominal hysterectomy  09/10/2013  . Melanoma excision  12/2011  . Vein surgery  05/2011    venous ablation   . Bunionectomy  10/1976  . Fracture surgery  09/1964    There were no vitals filed for this visit.  Visit Diagnosis:  Hip stiffness, unspecified laterality  Hip pain, unspecified laterality  Activity intolerance  Low back pain associated with a spinal disorder other than radiculopathy or spinal stenosis                     [facility] Adult PT Treatment/Exercise - 05/30/14 0001    Lumbar Exercises: Aerobic   Stationary Bike Level 4 x 82minutes   Lumbar Exercises: Machines for Strengthening   Leg Press St#7 incline #3  B LE 75# 3x10   Lumbar Exercises: Standing   Other Standing Lumbar Exercises mini tramp: weight shifting with abdominal bracing 3 waysx 1 min. each   Lumbar Exercises: Supine   Other Supine Lumbar Exercises Lt hip ER heel/slides slow for Rt hip opening   Knee/Hip Exercises: Standing   Walking with Sports Cord 25# forward/reverse backward/reverse,  20# sidestepping Lt & Rt  x 10 each   Cryotherapy   Number Minutes Cryotherapy 15 Minutes   Cryotherapy Location Back   Type of Cryotherapy Ice pack   Electrical Stimulation   Electrical Stimulation Location lumbar   Electrical Stimulation Action IFC   Electrical Stimulation Parameters 80-150Hz    Electrical Stimulation Goals Pain                  PT Short Term Goals - 05/30/14 1027    PT SHORT TERM GOAL #1   Title be independent in initial HEP for flexibility exercises   Time 4   Period Weeks   Status Achieved   PT SHORT TERM GOAL #2   Title understand sleeping positions that can reduce pain   Time 4   Period Weeks   Status Achieved   PT SHORT TERM GOAL #3   Title ambulate in a store with cart with moderate pain   Time 4   Period Weeks   Status On-going           PT Long Term Goals - 05/30/14 1028    PT LONG TERM GOAL #1   Title demonstrate understanding of posture and body mechanics   Time 8   Period Weeks   Status Achieved   PT LONG TERM GOAL #2   Title be  independent in advanced HEP for lumbar strength   Time 8   Period Weeks   Status On-going   PT LONG TERM GOAL #3   Title ambulate in a store with cart with mimimal to moderate pain   Time 8   Period Weeks   Status On-going   PT LONG TERM GOAL #4   Title sleep for 6 hours with miminal to moderate pain   Time 8   Period Weeks   Status On-going   PT LONG TERM GOAL #5   Title TUG test < or = to 12 seconds   Time 8   Period Weeks   Status Achieved               Plan - 05/30/14 1021    Clinical Impression Statement Pt continues to practice prone postition to tolerance to continue to stretch hip flexors.  Pt with limited progress toward goals secondary to need for THA scheduled in 3 weeks   Pt will benefit from skilled therapeutic intervention in order to improve on the following deficits Impaired flexibility;Pain;Improper body mechanics;Decreased mobility;Decreased activity  tolerance;Decreased endurance;Decreased strength;Difficulty walking   Rehab Potential Good   PT Frequency 2x / week   PT Duration 8 weeks   PT Treatment/Interventions ADLs/Self Care Home Management;Electrical Stimulation;Therapeutic exercise;Patient/family education;Moist Heat;Neuromuscular re-education;Therapeutic activities   PT Next Visit Plan endurance tasks, hip strength and flexibility.  Modalities PRN.  Try standing extension against the wall.   PT Home Exercise Plan continue with established HEP   Consulted and Agree with Plan of Care Patient        Problem List Patient Active Problem List   Diagnosis Date Noted  . Preoperative cardiovascular examination 04/22/2014  . PAF (paroxysmal atrial fibrillation) 04/22/2014  . Long term current use of antiarrhythmic drug 04/22/2014  . History of uterine cancer 04/22/2014  . Osteoarthritis, hip, bilateral 03/25/2014  . Lumbar and sacral osteoarthritis 12/10/2013    NAUMANN-HOUEGNIFIO,Abbi Mancini PTA 05/30/2014, 10:32 AM  [facility] [address] [place], [place], [address] Phone: [phone]   Fax:  [phone]

## 2014-06-02 ENCOUNTER — Ambulatory Visit: Payer: Medicare Other

## 2014-06-02 DIAGNOSIS — M4710 Other spondylosis with myelopathy, site unspecified: Secondary | ICD-10-CM | POA: Diagnosis not present

## 2014-06-02 DIAGNOSIS — I10 Essential (primary) hypertension: Secondary | ICD-10-CM | POA: Diagnosis not present

## 2014-06-02 DIAGNOSIS — R6889 Other general symptoms and signs: Secondary | ICD-10-CM

## 2014-06-02 DIAGNOSIS — M25659 Stiffness of unspecified hip, not elsewhere classified: Secondary | ICD-10-CM

## 2014-06-02 DIAGNOSIS — M160 Bilateral primary osteoarthritis of hip: Secondary | ICD-10-CM | POA: Diagnosis not present

## 2014-06-02 DIAGNOSIS — M545 Low back pain, unspecified: Secondary | ICD-10-CM

## 2014-06-02 DIAGNOSIS — Z79899 Other long term (current) drug therapy: Secondary | ICD-10-CM | POA: Diagnosis not present

## 2014-06-02 DIAGNOSIS — M25559 Pain in unspecified hip: Secondary | ICD-10-CM

## 2014-06-02 DIAGNOSIS — K219 Gastro-esophageal reflux disease without esophagitis: Secondary | ICD-10-CM | POA: Diagnosis not present

## 2014-06-02 DIAGNOSIS — M419 Scoliosis, unspecified: Secondary | ICD-10-CM | POA: Diagnosis not present

## 2014-06-02 NOTE — Therapy (Signed)
[facility] [address], [place], [place], [address] Phone: [phone]   Fax:  [phone]  Physical Therapy Treatment  Patient Details  Name: [name] MRN: [id number] Date of Birth: 11-29-1950 Referring Provider:  Charlett Blake, MD  Encounter Date: 06/02/2014      PT End of Session - 06/02/14 0927    Visit Number 15   PT Start Time 0844   PT Stop Time 0945   PT Time Calculation (min) 61 min   Activity Tolerance Patient tolerated treatment well   Behavior During Therapy [facility] for tasks assessed/performed      Past Medical History  Diagnosis Date  . Hypertension   . Swelling of both lower extremities   . Atrial fibrillation   . GERD (gastroesophageal reflux disease)   . Melanoma   . Skin cancer     Past Surgical History  Procedure Laterality Date  . Abdominal hysterectomy  09/10/2013  . Melanoma excision  12/2011  . Vein surgery  05/2011    venous ablation   . Bunionectomy  10/1976  . Fracture surgery  09/1964    There were no vitals filed for this visit.  Visit Diagnosis:  Hip stiffness, unspecified laterality  Hip pain, unspecified laterality  Activity intolerance  Low back pain associated with a spinal disorder other than radiculopathy or spinal stenosis      Subjective Assessment - 06/02/14 0847    Symptoms Ready for D/C.  Hip replacement surgery scheduled for 06/13/14.   Currently in Pain? Yes   Pain Score 3   Low back: 0-2/10   Pain Location Hip   Pain Orientation Left   Pain Descriptors / Indicators Aching;Tightness   Pain Type Chronic pain   Aggravating Factors  standing and walking   Pain Relieving Factors meds, rest, heat   Effect of Pain on Daily Activities limited standing and walking            [facility] PT Assessment - 06/02/14 0001    Observation/Other Assessments   Focus on Therapeutic Outcomes (FOTO)  54% limitation                   [facility] Adult PT  Treatment/Exercise - 06/02/14 0001    Lumbar Exercises: Stretches   Active Hamstring Stretch 3 reps;20 seconds   Lumbar Exercises: Aerobic   Stationary Bike Level 4 x 75mnutes   Lumbar Exercises: Machines for Strengthening   Leg Press St#7 incline #3  B LE 75# 3x10   Lumbar Exercises: Standing   Other Standing Lumbar Exercises mini tramp: weight shifting with abdominal bracing 3 waysx 1 min. each   Knee/Hip Exercises: Standing   Walking with Sports Cord 25# forward/reverse backward/reverse, 20# sidestepping Lt & Rt  x 10 each   Moist Heat Therapy   Number Minutes Moist Heat 15 Minutes   Moist Heat Location Other (comment)  lumbar   Electrical Stimulation   Electrical Stimulation Location lumbar   Electrical Stimulation Action IFC   Electrical Stimulation Parameters 15 minutes   Electrical Stimulation Goals Pain                  PT Short Term Goals - 05/30/14 1027    PT SHORT TERM GOAL #1   Title be independent in initial HEP for flexibility exercises   Time 4   Period Weeks   Status Achieved   PT SHORT TERM GOAL #2   Title understand sleeping positions that can reduce  pain   Time 4   Period Weeks   Status Achieved   PT SHORT TERM GOAL #3   Title ambulate in a store with cart with moderate pain   Time 4   Period Weeks   Status On-going           PT Long Term Goals - 13-Jun-2014 5809    PT LONG TERM GOAL #1   Title demonstrate understanding of posture and body mechanics   Status Achieved   PT LONG TERM GOAL #2   Title be independent in advanced HEP for lumbar strength   Status Achieved  Pt is independent in flexibility and core strength   PT LONG TERM GOAL #3   Title ambulate in a store with cart with mimimal to moderate pain   Status Not Met  hip pain 6-7/10 reported   PT LONG TERM GOAL #4   Title sleep for 6 hours with miminal to moderate pain   Status Not Met  Pt with continued sleep limitations due to pain   PT LONG TERM GOAL #5   Title TUG test  < or = to 12 seconds   Status Achieved               Plan - 13-Jun-2014 0900    Clinical Impression Statement Pt reports 85-90% improvement regarding LBP.  Pt with continued hip pain and will have total hip replacement 06/13/14.     PT Next Visit Plan D/C PT to HEP   Consulted and Agree with Plan of Care Patient          G-Codes - 06-13-14 0850    Functional Assessment Tool Used FOTO: 54% limitation   Functional Limitation Mobility: Walking and moving around   Mobility: Walking and Moving Around Goal Status [phone]) At least 40 percent but less than 60 percent impaired, limited or restricted   Mobility: Walking and Moving Around Discharge Status [id number]) At least 40 percent but less than 60 percent impaired, limited or restricted      Problem List Patient Active Problem List   Diagnosis Date Noted  . Preoperative cardiovascular examination 04/22/2014  . PAF (paroxysmal atrial fibrillation) 04/22/2014  . Long term current use of antiarrhythmic drug 04/22/2014  . History of uterine cancer 04/22/2014  . Osteoarthritis, hip, bilateral 03/25/2014  . Lumbar and sacral osteoarthritis 12/10/2013  PHYSICAL THERAPY DISCHARGE SUMMARY  Visits from Start of Care: 15  Current functional level related to goals / functional outcomes: See above for goal assessment and status update.   Remaining deficits: Pt with continued hip pain that limits function.  Pt will have total hip replacement 06/13/14.   Education / Equipment: HEP, Economist education Plan: Patient agrees to discharge.  Patient goals were partially met. Patient is being discharged due to being pleased with the current functional level.  ?????      [name], PT 2014-06-13, 9:27 AM  [facility] [address], [place], [place], [address] Phone: [phone]   Fax:  [phone]

## 2014-06-03 ENCOUNTER — Ambulatory Visit (HOSPITAL_BASED_OUTPATIENT_CLINIC_OR_DEPARTMENT_OTHER): Payer: Medicare Other | Admitting: Physical Medicine & Rehabilitation

## 2014-06-03 ENCOUNTER — Encounter: Payer: Self-pay | Admitting: Physical Medicine & Rehabilitation

## 2014-06-03 VITALS — BP 132/71 | HR 78 | Resp 14

## 2014-06-03 DIAGNOSIS — G894 Chronic pain syndrome: Secondary | ICD-10-CM | POA: Diagnosis not present

## 2014-06-03 DIAGNOSIS — M24559 Contracture, unspecified hip: Secondary | ICD-10-CM | POA: Diagnosis not present

## 2014-06-03 DIAGNOSIS — M1612 Unilateral primary osteoarthritis, left hip: Secondary | ICD-10-CM | POA: Insufficient documentation

## 2014-06-03 DIAGNOSIS — M160 Bilateral primary osteoarthritis of hip: Secondary | ICD-10-CM

## 2014-06-03 DIAGNOSIS — M47817 Spondylosis without myelopathy or radiculopathy, lumbosacral region: Secondary | ICD-10-CM

## 2014-06-03 DIAGNOSIS — Z5181 Encounter for therapeutic drug level monitoring: Secondary | ICD-10-CM | POA: Diagnosis not present

## 2014-06-03 DIAGNOSIS — M24552 Contracture, left hip: Secondary | ICD-10-CM | POA: Diagnosis not present

## 2014-06-03 DIAGNOSIS — Z79899 Other long term (current) drug therapy: Secondary | ICD-10-CM | POA: Diagnosis not present

## 2014-06-03 DIAGNOSIS — M4710 Other spondylosis with myelopathy, site unspecified: Secondary | ICD-10-CM | POA: Diagnosis not present

## 2014-06-03 NOTE — Progress Notes (Signed)
Subjective:    Patient ID: [name], female    DOB: April 01, 1950, 64 y.o.   MRN: [id number]  HPI Left side low back pain better after RF 05/13/2014  Left hip pain worsened after prone lying exercise  Has been sleeping supine with 2 pillows under knees for years  Scheduled for Left ant hip replacement 4/5 Pre op appt with Cardiologist in am   Pain Inventory Average Pain 3 Pain Right Now 3 My pain is constant  In the last 24 hours, has pain interfered with the following? General activity 7 Relation with others 0 Enjoyment of life 4 What TIME of day is your pain at its worst? night Sleep (in general) Poor  Pain is worse with: standing Pain improves with: rest, therapy/exercise, medication and injections Relief from Meds: 4  Mobility walk without assistance use a cane how many minutes can you walk? w15 ability to climb steps?  yes do you drive?  yes Do you have any goals in this area?  yes  Function not employed: date last employed . retired  Neuro/Psych bladder control problems trouble walking spasms dizziness  Prior Studies Any changes since last visit?  no  Physicians involved in your care Any changes since last visit?  no   Family History  Problem Relation Age of Onset  . Uterine cancer Mother   . Diabetes Brother 21  . Hypertension Father 17  . Hyperlipidemia Father   . Cancer Father 68  . Epilepsy Father 40  . Arthritis Sister 50  . Cancer Maternal Grandmother   . Stroke Paternal Grandfather   . Arthritis Sister    History   Social History  . Marital Status: Divorced    Spouse Name: N/A  . Number of Children: 2  . Years of Education: JD   Occupational History  . lawyer    Social History Main Topics  . Smoking status: Never Smoker   . Smokeless tobacco: Never Used  . Alcohol Use: No  . Drug Use: No  . Sexual Activity: Not on file   Other Topics Concern  . None   Social History Narrative   Past Surgical History  Procedure  Laterality Date  . Abdominal hysterectomy  09/10/2013  . Melanoma excision  12/2011  . Vein surgery  05/2011    venous ablation   . Bunionectomy  10/1976  . Fracture surgery  09/1964   Past Medical History  Diagnosis Date  . Hypertension   . Swelling of both lower extremities   . Atrial fibrillation   . GERD (gastroesophageal reflux disease)   . Melanoma   . Skin cancer    BP 132/71 mmHg  Pulse 78  Resp 14  SpO2 95%  Opioid Risk Score:   Fall Risk Score: Low Fall Risk (0-5 points) (patient previously educated)`1  Depression screen PHQ 2/9  Depression screen PHQ 2/9 06/03/2014  Decreased Interest 1  Down, Depressed, Hopeless 0  PHQ - 2 Score 1  Altered sleeping 3  Tired, decreased energy 0  Change in appetite 0  Feeling bad or failure about yourself  0  Trouble concentrating 0  Moving slowly or fidgety/restless 0  Suicidal thoughts 0  PHQ-9 Score 4    1qq Review of Systems  Musculoskeletal: Positive for gait problem.  Neurological: Positive for dizziness.       Spasms  All other systems reviewed and are negative.      Objective:   Physical Exam  Constitutional: She is oriented to  person, place, and time. She appears well-developed and well-nourished.  HENT:  Head: Normocephalic and atraumatic.  Eyes: Pupils are equal, round, and reactive to light.  Neurological: She is alert and oriented to person, place, and time. She has normal reflexes.  Psychiatric: She has a normal mood and affect.  Nursing note and vitals reviewed.   No tenderness palpation in the lumbar paraspinals Left hip with 25 flexion contracture      Assessment & Plan:  1.  Lumbar spondylosis-overall improved after Left L3,4 Medial branch and L5 dorsal ramus Radiofrequency, may need to repeat in 6-12 months  2. 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 Scheduled for surgery with Dr. Ninfa Linden, 06/17/2014, Based on her chronic contracture in the degree of  contracture, would recommend postoperative physical therapy. She has been benefiting from some of the preoperative physical therapy she's been receiving however I think she is hitting a wall in terms of her contracture

## 2014-06-03 NOTE — Patient Instructions (Signed)
Recommend physical therapy postoperative  Orthopedics will write for pain medication such as hydrocodone or oxycodone postoperatively

## 2014-06-05 ENCOUNTER — Encounter (HOSPITAL_COMMUNITY)
Admission: RE | Admit: 2014-06-05 | Discharge: 2014-06-05 | Disposition: A | Discharge status: Home / Self Care | Payer: Medicare Other | Source: Ambulatory Visit | Attending: Orthopaedic Surgery | Admitting: Orthopaedic Surgery

## 2014-06-05 ENCOUNTER — Telehealth: Payer: Self-pay | Admitting: Internal Medicine

## 2014-06-05 ENCOUNTER — Encounter (HOSPITAL_COMMUNITY): Payer: Self-pay

## 2014-06-05 HISTORY — DX: Failed or difficult intubation, initial encounter: T88.4XXA

## 2014-06-05 HISTORY — DX: Scoliosis, unspecified: M41.9

## 2014-06-05 LAB — CBC
HCT: 40.9 % (ref 36.0–46.0)
HEMOGLOBIN: 13.9 g/dL (ref 12.0–15.0)
MCH: 34.1 pg — ABNORMAL HIGH (ref 26.0–34.0)
MCHC: 34 g/dL (ref 30.0–36.0)
MCV: 100.2 fL — ABNORMAL HIGH (ref 78.0–100.0)
Platelets: 240 10*3/uL (ref 150–400)
RBC: 4.08 MIL/uL (ref 3.87–5.11)
RDW: 12.2 % (ref 11.5–15.5)
WBC: 7.4 10*3/uL (ref 4.0–10.5)

## 2014-06-05 LAB — SURGICAL PCR SCREEN
MRSA, PCR: NEGATIVE
STAPHYLOCOCCUS AUREUS: NEGATIVE

## 2014-06-05 LAB — BASIC METABOLIC PANEL
Anion gap: 8 (ref 5–15)
BUN: 23 mg/dL (ref 6–23)
CO2: 26 mmol/L (ref 19–32)
Calcium: 9.3 mg/dL (ref 8.4–10.5)
Chloride: 101 mmol/L (ref 96–112)
Creatinine, Ser: 0.82 mg/dL (ref 0.50–1.10)
GFR calc Af Amer: 85 mL/min — ABNORMAL LOW (ref >90)
GFR, EST NON AFRICAN AMERICAN: 73 mL/min — AB (ref >90)
Glucose, Bld: 93 mg/dL (ref 70–99)
Potassium: 4.4 mmol/L (ref 3.5–5.1)
SODIUM: 135 mmol/L (ref 135–145)

## 2014-06-05 NOTE — Telephone Encounter (Signed)
Please call,pt had episode of atrial fib last Tuesday3-15-16. She is concerned about this because she is scheduled for surgery on 06-17-14. Does she need to see Dr Debara Pickett for this,before surgery.

## 2014-06-05 NOTE — Telephone Encounter (Signed)
Pt had episode of palpitations ~10 days ago in the middle of the night. She states it was occurrent for ~2 hrs and then resolved.  She attributes to viral illness. She experienced some sweating, dizziness w/ this. She denies having had CP or other obvious symptoms.   She has not had any problems since then.  Pt concerned that she may require evaluation - she does not want to delay her hip surgery scheduled for 06-17-14, but wanted to make sure Dr. Debara Pickett was aware of the event & know if he had recommendations.   Will route.

## 2014-06-05 NOTE — Telephone Encounter (Signed)
[id number]

## 2014-06-05 NOTE — Pre-Procedure Instructions (Signed)
[name]  06/05/2014   Your procedure is scheduled on:  06/17/14  Report to [facility] Admitting at 1030 AM.  Call this number if you have problems the morning of surgery: [phone]   Remember:   Do not eat food or drink liquids after midnight.   Take these medicines the morning of surgery with A SIP OF WATER: diltizem,neurontin,protonix,aldactone   Do not wear jewelry, make-up or nail polish.  Do not wear lotions, powders, or perfumes. You may wear deodorant.  Do not shave 48 hours prior to surgery. Men may shave face and neck.  Do not bring valuables to the hospital.  [facility] is not responsible                  for any belongings or valuables.               Contacts, dentures or bridgework may not be worn into surgery.  Leave suitcase in the car. After surgery it may be brought to your room.  For patients admitted to the hospital, discharge time is determined by your                treatment team.               Patients discharged the day of surgery will not be allowed to drive  home.  Name and phone number of your driver: family  Special Instructions: Shower using CHG 2 nights before surgery and the night before surgery.  If you shower the day of surgery use CHG.  Use special wash - you have one bottle of CHG for all showers.  You should use approximately 1/3 of the bottle for each shower.   Please read over the following fact sheets that you were given: Pain Booklet, Coughing and Deep Breathing, MRSA Information and Surgical Site Infection Prevention

## 2014-06-05 NOTE — Progress Notes (Signed)
Pt  Reported a some sweating,dizziness last week. She is calling Dr Debara Pickett to report this. She does have cardiac clearance in epic

## 2014-06-06 NOTE — Progress Notes (Signed)
Anesthesia Chart Review:  Pt is 64 year old female scheduled for L total hip arthroplasty on 06/17/2014 with Dr. Ninfa Linden.   Cardiologist is Dr. Debara Pickett.   PMH includes: HTN, atrial fibrillation, melanoma, GERD, scoliosis, uterine cancer. Pt reports history of difficult intubation- pt reports neck needs to be in hyperextension due to scoliosis. Never smoker. BMI 32.   Medications include: ASA, flecainide, diltiazem.   Preoperative labs reviewed.    EKG 04/22/2014: NSR. Possible L atrial enlargement.   Pt has cardiology clearance for surgery from Dr. Debara Pickett. However, pt reported in PAT that she had had palpitations (flet was a fib) recently, associated with dizziness and sweating. Pt has placed call to Dr. Lysbeth Penner office to check to see if he had any concerns about this in relation to upcoming surgery. Will revisit chart again after Dr. Debara Pickett has weighed in.   Willeen Cass, FNP-BC [facility]/Anesthesiology Phone: [phone] 06/06/2014 2:04 PM

## 2014-06-07 NOTE — Telephone Encounter (Signed)
Sounds like she may have had some PAF - not unusual to have that with acute viral illness. Likely okay to go ahead with surgery if her pre-op EKG shows she is back in sinus.  Dr. Lemmie Evens

## 2014-06-09 NOTE — Telephone Encounter (Signed)
Spoke w/ patient, gave Dr. Lysbeth Penner instructions, she voiced understanding & thanks.

## 2014-06-16 MED ORDER — CEFAZOLIN SODIUM-DEXTROSE 2-3 GM-% IV SOLR
2.0000 g | INTRAVENOUS | Status: AC
  Administered 2014-06-17: 2 g via INTRAVENOUS
  Filled 2014-06-16: qty 50

## 2014-06-16 NOTE — Anesthesia Preprocedure Evaluation (Addendum)
Anesthesia Evaluation   Patient awake    Reviewed: Allergy & Precautions, NPO status , Patient's Chart, lab work & pertinent test results, reviewed documented beta blocker date and time , Unable to perform ROS - Chart review only  History of Anesthesia Complications (+) DIFFICULT AIRWAY  Airway Mallampati: II   Neck ROM: Limited    Dental  (+) Teeth Intact, Caps, Dental Advisory Given   Pulmonary neg pulmonary ROS,  breath sounds clear to auscultation        Cardiovascular hypertension, Pt. on medications + dysrhythmias (PAF, NSR last EKG) Atrial Fibrillation Rhythm:Regular  Has cardiology clearence   Neuro/Psych Pt complains of neck aches. negative neurological ROS     GI/Hepatic Neg liver ROS, GERD-  Medicated,  Endo/Other  negative endocrine ROS  Renal/GU negative Renal ROS     Musculoskeletal   Abdominal (+)  Abdomen: soft.    Peds  Hematology 14/40   Anesthesia Other Findings Hx of neck fusion  Will keep neutral position  Reproductive/Obstetrics                           Anesthesia Physical Anesthesia Plan  ASA: III  Anesthesia Plan: General   Post-op Pain Management:    Induction: Intravenous  Airway Management Planned: Oral ETT and Video Laryngoscope Planned  Additional Equipment:   Intra-op Plan:   Post-operative Plan:   Informed Consent: I have reviewed the patients History and Physical, chart, labs and discussed the procedure including the risks, benefits and alternatives for the proposed anesthesia with the patient or authorized representative who has indicated his/her understanding and acceptance.     Plan Discussed with:   Anesthesia Plan Comments: (GLIDE, multimodal pain rx)        Anesthesia Quick Evaluation

## 2014-06-17 ENCOUNTER — Inpatient Hospital Stay (HOSPITAL_COMMUNITY): Payer: Medicare Other | Admitting: Vascular Surgery

## 2014-06-17 ENCOUNTER — Inpatient Hospital Stay (HOSPITAL_COMMUNITY): Payer: Medicare Other

## 2014-06-17 ENCOUNTER — Inpatient Hospital Stay (HOSPITAL_COMMUNITY)
Admission: RE | Admit: 2014-06-17 | Discharge: 2014-06-20 | DRG: 470 | Disposition: A | Discharge status: Home Health | Payer: Medicare Other | Source: Ambulatory Visit | Attending: Orthopaedic Surgery | Admitting: Orthopaedic Surgery

## 2014-06-17 ENCOUNTER — Inpatient Hospital Stay (HOSPITAL_COMMUNITY): Payer: Medicare Other | Admitting: Anesthesiology

## 2014-06-17 ENCOUNTER — Encounter (HOSPITAL_COMMUNITY)
Admission: RE | Disposition: A | Discharge status: Home Health | Payer: Self-pay | Source: Ambulatory Visit | Attending: Orthopaedic Surgery

## 2014-06-17 ENCOUNTER — Encounter (HOSPITAL_COMMUNITY): Payer: Self-pay | Admitting: Anesthesiology

## 2014-06-17 DIAGNOSIS — I10 Essential (primary) hypertension: Secondary | ICD-10-CM | POA: Diagnosis present

## 2014-06-17 DIAGNOSIS — Z96642 Presence of left artificial hip joint: Secondary | ICD-10-CM | POA: Diagnosis not present

## 2014-06-17 DIAGNOSIS — K219 Gastro-esophageal reflux disease without esophagitis: Secondary | ICD-10-CM | POA: Diagnosis present

## 2014-06-17 DIAGNOSIS — Z8542 Personal history of malignant neoplasm of other parts of uterus: Secondary | ICD-10-CM

## 2014-06-17 DIAGNOSIS — M169 Osteoarthritis of hip, unspecified: Secondary | ICD-10-CM | POA: Diagnosis not present

## 2014-06-17 DIAGNOSIS — I480 Paroxysmal atrial fibrillation: Secondary | ICD-10-CM | POA: Diagnosis present

## 2014-06-17 DIAGNOSIS — Z471 Aftercare following joint replacement surgery: Secondary | ICD-10-CM | POA: Diagnosis not present

## 2014-06-17 DIAGNOSIS — Z85820 Personal history of malignant melanoma of skin: Secondary | ICD-10-CM | POA: Diagnosis not present

## 2014-06-17 DIAGNOSIS — Z7982 Long term (current) use of aspirin: Secondary | ICD-10-CM | POA: Diagnosis not present

## 2014-06-17 DIAGNOSIS — Z419 Encounter for procedure for purposes other than remedying health state, unspecified: Secondary | ICD-10-CM

## 2014-06-17 DIAGNOSIS — M419 Scoliosis, unspecified: Secondary | ICD-10-CM | POA: Diagnosis present

## 2014-06-17 DIAGNOSIS — M1612 Unilateral primary osteoarthritis, left hip: Secondary | ICD-10-CM | POA: Diagnosis not present

## 2014-06-17 DIAGNOSIS — D62 Acute posthemorrhagic anemia: Secondary | ICD-10-CM | POA: Diagnosis not present

## 2014-06-17 DIAGNOSIS — I4891 Unspecified atrial fibrillation: Secondary | ICD-10-CM | POA: Diagnosis not present

## 2014-06-17 DIAGNOSIS — M25552 Pain in left hip: Secondary | ICD-10-CM | POA: Diagnosis present

## 2014-06-17 HISTORY — PX: TOTAL HIP ARTHROPLASTY: SHX124

## 2014-06-17 SURGERY — ARTHROPLASTY, HIP, TOTAL, ANTERIOR APPROACH
Anesthesia: General | Site: Hip | Laterality: Left

## 2014-06-17 MED ORDER — FERROUS SULFATE 325 (65 FE) MG PO TABS
325.0000 mg | ORAL_TABLET | Freq: Three times a day (TID) | ORAL | Status: DC
  Administered 2014-06-18 (×3): 325 mg via ORAL
  Filled 2014-06-17 (×3): qty 1

## 2014-06-17 MED ORDER — ACETAMINOPHEN 650 MG RE SUPP: 650.0000 mg | Freq: Four times a day (QID) | RECTAL | Status: DC | PRN

## 2014-06-17 MED ORDER — SODIUM CHLORIDE 0.9 % IR SOLN
Status: DC | PRN
  Administered 2014-06-17: 3000 mL

## 2014-06-17 MED ORDER — HYDROMORPHONE HCL 1 MG/ML IJ SOLN: 1.0000 mg | INTRAMUSCULAR | Status: DC | PRN

## 2014-06-17 MED ORDER — PANTOPRAZOLE SODIUM 40 MG PO TBEC
40.0000 mg | DELAYED_RELEASE_TABLET | Freq: Every day | ORAL | Status: DC
Start: 2014-06-18 — End: 2014-06-20
  Administered 2014-06-18 – 2014-06-20 (×3): 40 mg via ORAL
  Filled 2014-06-17 (×3): qty 1

## 2014-06-17 MED ORDER — METOCLOPRAMIDE HCL 5 MG PO TABS: 5.0000 mg | ORAL_TABLET | Freq: Three times a day (TID) | ORAL | Status: DC | PRN

## 2014-06-17 MED ORDER — PROPOFOL 10 MG/ML IV BOLUS
INTRAVENOUS | Status: AC
  Filled 2014-06-17: qty 20

## 2014-06-17 MED ORDER — LIDOCAINE HCL (CARDIAC) 20 MG/ML IV SOLN
INTRAVENOUS | Status: DC | PRN
  Administered 2014-06-17: 90 mg via INTRAVENOUS

## 2014-06-17 MED ORDER — DEXTROSE 5 % IV SOLN
INTRAVENOUS | Status: DC | PRN
  Administered 2014-06-17: 13:00:00 via INTRAVENOUS

## 2014-06-17 MED ORDER — ALUM & MAG HYDROXIDE-SIMETH 200-200-20 MG/5ML PO SUSP: 30.0000 mL | ORAL | Status: DC | PRN

## 2014-06-17 MED ORDER — SPIRONOLACTONE 50 MG PO TABS
50.0000 mg | ORAL_TABLET | Freq: Two times a day (BID) | ORAL | Status: DC
  Administered 2014-06-17 – 2014-06-20 (×6): 50 mg via ORAL
  Filled 2014-06-17 (×8): qty 1

## 2014-06-17 MED ORDER — PROPOFOL 10 MG/ML IV BOLUS
INTRAVENOUS | Status: DC | PRN
  Administered 2014-06-17: 150 mg via INTRAVENOUS

## 2014-06-17 MED ORDER — METHOCARBAMOL 500 MG PO TABS
500.0000 mg | ORAL_TABLET | Freq: Four times a day (QID) | ORAL | Status: DC | PRN
  Administered 2014-06-17 – 2014-06-19 (×6): 500 mg via ORAL
  Filled 2014-06-17 (×5): qty 1

## 2014-06-17 MED ORDER — POLYETHYLENE GLYCOL 3350 17 G PO PACK: 17.0000 g | PACK | Freq: Every day | ORAL | Status: DC | PRN

## 2014-06-17 MED ORDER — MENTHOL 3 MG MT LOZG
1.0000 | LOZENGE | OROMUCOSAL | Status: DC | PRN
  Administered 2014-06-18: 3 mg via ORAL
  Filled 2014-06-17: qty 9

## 2014-06-17 MED ORDER — ARTIFICIAL TEARS OP OINT
TOPICAL_OINTMENT | OPHTHALMIC | Status: DC | PRN
  Administered 2014-06-17: 1 via OPHTHALMIC

## 2014-06-17 MED ORDER — TRAMADOL HCL 50 MG PO TABS: 50.0000 mg | ORAL_TABLET | Freq: Three times a day (TID) | ORAL | Status: DC | PRN

## 2014-06-17 MED ORDER — ASPIRIN EC 325 MG PO TBEC
325.0000 mg | DELAYED_RELEASE_TABLET | Freq: Two times a day (BID) | ORAL | Status: DC
  Administered 2014-06-18 – 2014-06-20 (×5): 325 mg via ORAL
  Filled 2014-06-17 (×5): qty 1

## 2014-06-17 MED ORDER — EPHEDRINE SULFATE 50 MG/ML IJ SOLN
INTRAMUSCULAR | Status: DC | PRN
  Administered 2014-06-17: 10 mg via INTRAVENOUS

## 2014-06-17 MED ORDER — ONDANSETRON HCL 4 MG/2ML IJ SOLN
INTRAMUSCULAR | Status: DC | PRN
  Administered 2014-06-17: 4 mg via INTRAVENOUS

## 2014-06-17 MED ORDER — FENTANYL CITRATE 0.05 MG/ML IJ SOLN
INTRAMUSCULAR | Status: AC
  Filled 2014-06-17: qty 5

## 2014-06-17 MED ORDER — MIDAZOLAM HCL 5 MG/5ML IJ SOLN
INTRAMUSCULAR | Status: DC | PRN
  Administered 2014-06-17: 0.5 mg via INTRAVENOUS

## 2014-06-17 MED ORDER — FLECAINIDE ACETATE 50 MG PO TABS
50.0000 mg | ORAL_TABLET | Freq: Two times a day (BID) | ORAL | Status: DC
  Administered 2014-06-17 – 2014-06-20 (×6): 50 mg via ORAL
  Filled 2014-06-17 (×9): qty 1

## 2014-06-17 MED ORDER — ROCURONIUM BROMIDE 100 MG/10ML IV SOLN
INTRAVENOUS | Status: DC | PRN
  Administered 2014-06-17: 40 mg via INTRAVENOUS

## 2014-06-17 MED ORDER — OXYCODONE HCL 5 MG PO TABS
5.0000 mg | ORAL_TABLET | ORAL | Status: DC | PRN
  Administered 2014-06-17 – 2014-06-18 (×4): 10 mg via ORAL
  Administered 2014-06-18: 5 mg via ORAL
  Administered 2014-06-18: 10 mg via ORAL
  Administered 2014-06-18: 5 mg via ORAL
  Administered 2014-06-18 (×2): 10 mg via ORAL
  Administered 2014-06-19 (×2): 5 mg via ORAL
  Administered 2014-06-19 (×2): 10 mg via ORAL
  Administered 2014-06-19: 5 mg via ORAL
  Administered 2014-06-20 (×3): 10 mg via ORAL
  Filled 2014-06-17 (×4): qty 2
  Filled 2014-06-17 (×2): qty 1
  Filled 2014-06-17: qty 2
  Filled 2014-06-17: qty 1
  Filled 2014-06-17 (×4): qty 2
  Filled 2014-06-17: qty 1
  Filled 2014-06-17 (×2): qty 2
  Filled 2014-06-17: qty 1

## 2014-06-17 MED ORDER — DOCUSATE SODIUM 100 MG PO CAPS
100.0000 mg | ORAL_CAPSULE | Freq: Two times a day (BID) | ORAL | Status: DC
  Administered 2014-06-17 – 2014-06-20 (×6): 100 mg via ORAL
  Filled 2014-06-17 (×6): qty 1

## 2014-06-17 MED ORDER — FENTANYL CITRATE 0.05 MG/ML IJ SOLN
INTRAMUSCULAR | Status: AC
  Filled 2014-06-17: qty 2

## 2014-06-17 MED ORDER — PROMETHAZINE HCL 25 MG/ML IJ SOLN: 6.2500 mg | INTRAMUSCULAR | Status: DC | PRN

## 2014-06-17 MED ORDER — METHOCARBAMOL 500 MG PO TABS
ORAL_TABLET | ORAL | Status: AC
  Filled 2014-06-17: qty 1

## 2014-06-17 MED ORDER — ACETAMINOPHEN 325 MG PO TABS: 650.0000 mg | ORAL_TABLET | Freq: Four times a day (QID) | ORAL | Status: DC | PRN

## 2014-06-17 MED ORDER — DILTIAZEM HCL ER BEADS 240 MG PO CP24
240.0000 mg | ORAL_CAPSULE | Freq: Every day | ORAL | Status: DC
  Administered 2014-06-18 – 2014-06-20 (×3): 240 mg via ORAL
  Filled 2014-06-17 (×3): qty 1

## 2014-06-17 MED ORDER — CEFAZOLIN SODIUM 1-5 GM-% IV SOLN
1.0000 g | Freq: Four times a day (QID) | INTRAVENOUS | Status: AC
  Administered 2014-06-17 – 2014-06-18 (×2): 1 g via INTRAVENOUS
  Filled 2014-06-17 (×3): qty 50

## 2014-06-17 MED ORDER — MIDAZOLAM HCL 2 MG/2ML IJ SOLN
INTRAMUSCULAR | Status: AC
  Filled 2014-06-17: qty 2

## 2014-06-17 MED ORDER — GABAPENTIN 600 MG PO TABS
600.0000 mg | ORAL_TABLET | Freq: Three times a day (TID) | ORAL | Status: DC
  Administered 2014-06-20: 600 mg via ORAL
  Filled 2014-06-17 (×12): qty 1

## 2014-06-17 MED ORDER — FENTANYL CITRATE 0.05 MG/ML IJ SOLN
INTRAMUSCULAR | Status: DC | PRN
  Administered 2014-06-17 (×5): 50 ug via INTRAVENOUS

## 2014-06-17 MED ORDER — DEXTROSE 5 % IV SOLN
500.0000 mg | Freq: Four times a day (QID) | INTRAVENOUS | Status: DC | PRN
  Filled 2014-06-17: qty 5

## 2014-06-17 MED ORDER — MEPERIDINE HCL 25 MG/ML IJ SOLN: 6.2500 mg | INTRAMUSCULAR | Status: DC | PRN

## 2014-06-17 MED ORDER — LACTATED RINGERS IV SOLN
INTRAVENOUS | Status: DC
  Administered 2014-06-17: 12:00:00 via INTRAVENOUS

## 2014-06-17 MED ORDER — OXYCODONE HCL 5 MG PO TABS
ORAL_TABLET | ORAL | Status: AC
  Filled 2014-06-17: qty 2

## 2014-06-17 MED ORDER — LACTATED RINGERS IV SOLN
INTRAVENOUS | Status: DC | PRN
  Administered 2014-06-17 (×2): via INTRAVENOUS

## 2014-06-17 MED ORDER — ONDANSETRON HCL 4 MG PO TABS
4.0000 mg | ORAL_TABLET | Freq: Four times a day (QID) | ORAL | Status: DC | PRN
  Administered 2014-06-18: 4 mg via ORAL
  Filled 2014-06-17: qty 1

## 2014-06-17 MED ORDER — PHENOL 1.4 % MT LIQD: 1.0000 | OROMUCOSAL | Status: DC | PRN

## 2014-06-17 MED ORDER — ONDANSETRON HCL 4 MG/2ML IJ SOLN
4.0000 mg | Freq: Four times a day (QID) | INTRAMUSCULAR | Status: DC | PRN
  Administered 2014-06-18 (×2): 4 mg via INTRAVENOUS
  Filled 2014-06-17 (×2): qty 2

## 2014-06-17 MED ORDER — SODIUM CHLORIDE 0.9 % IV SOLN
INTRAVENOUS | Status: DC
  Administered 2014-06-17: 20:00:00 via INTRAVENOUS

## 2014-06-17 MED ORDER — DIPHENHYDRAMINE HCL 12.5 MG/5ML PO ELIX: 12.5000 mg | ORAL_SOLUTION | ORAL | Status: DC | PRN

## 2014-06-17 MED ORDER — SODIUM CHLORIDE 0.9 % IV SOLN
1000.0000 mg | INTRAVENOUS | Status: AC
  Administered 2014-06-17: 1000 mg via INTRAVENOUS
  Filled 2014-06-17: qty 10

## 2014-06-17 MED ORDER — ALBUMIN HUMAN 5 % IV SOLN
INTRAVENOUS | Status: DC | PRN
Start: 2014-06-17 — End: 2014-06-17
  Administered 2014-06-17: 14:00:00 via INTRAVENOUS

## 2014-06-17 MED ORDER — FENTANYL CITRATE 0.05 MG/ML IJ SOLN
25.0000 ug | INTRAMUSCULAR | Status: DC | PRN
  Administered 2014-06-17: 50 ug via INTRAVENOUS
  Administered 2014-06-17: 0.5 ug via INTRAVENOUS
  Administered 2014-06-17: 50 ug via INTRAVENOUS

## 2014-06-17 MED ORDER — DEXAMETHASONE SODIUM PHOSPHATE 4 MG/ML IJ SOLN
INTRAMUSCULAR | Status: DC | PRN
  Administered 2014-06-17: 8 mg via INTRAVENOUS

## 2014-06-17 MED ORDER — METOCLOPRAMIDE HCL 5 MG/ML IJ SOLN: 5.0000 mg | Freq: Three times a day (TID) | INTRAMUSCULAR | Status: DC | PRN

## 2014-06-17 MED ORDER — PHENYLEPHRINE HCL 10 MG/ML IJ SOLN
INTRAMUSCULAR | Status: DC | PRN
  Administered 2014-06-17: 80 ug via INTRAVENOUS

## 2014-06-17 MED ORDER — 0.9 % SODIUM CHLORIDE (POUR BTL) OPTIME
TOPICAL | Status: DC | PRN
  Administered 2014-06-17: 1000 mL

## 2014-06-17 SURGICAL SUPPLY — 55 items
BENZOIN TINCTURE PRP APPL 2/3 (GAUZE/BANDAGES/DRESSINGS) ×3 IMPLANT
BLADE SAW SGTL 18X1.27X75 (BLADE) ×2 IMPLANT
BLADE SAW SGTL 18X1.27X75MM (BLADE) ×1
BLADE SURG ROTATE 9660 (MISCELLANEOUS) IMPLANT
BNDG GAUZE ELAST 4 BULKY (GAUZE/BANDAGES/DRESSINGS) IMPLANT
CAPT HIP TOTAL 2 ×3 IMPLANT
CELLS DAT CNTRL 66122 CELL SVR (MISCELLANEOUS) ×1 IMPLANT
CLOSURE STERI-STRIP 1/2X4 (GAUZE/BANDAGES/DRESSINGS) ×1
CLOSURE WOUND 1/2 X4 (GAUZE/BANDAGES/DRESSINGS) ×2
CLSR STERI-STRIP ANTIMIC 1/2X4 (GAUZE/BANDAGES/DRESSINGS) ×2 IMPLANT
COVER SURGICAL LIGHT HANDLE (MISCELLANEOUS) ×3 IMPLANT
DRAPE C-ARM 42X72 X-RAY (DRAPES) ×3 IMPLANT
DRAPE IMP U-DRAPE 54X76 (DRAPES) ×3 IMPLANT
DRAPE STERI IOBAN 125X83 (DRAPES) ×3 IMPLANT
DRAPE U-SHAPE 47X51 STRL (DRAPES) ×9 IMPLANT
DRSG AQUACEL AG ADV 3.5X10 (GAUZE/BANDAGES/DRESSINGS) ×3 IMPLANT
DURAPREP 26ML APPLICATOR (WOUND CARE) ×3 IMPLANT
ELECT BLADE 4.0 EZ CLEAN MEGAD (MISCELLANEOUS)
ELECT BLADE 6.5 EXT (BLADE) IMPLANT
ELECT CAUTERY BLADE 6.4 (BLADE) ×3 IMPLANT
ELECT REM PT RETURN 9FT ADLT (ELECTROSURGICAL) ×3
ELECTRODE BLDE 4.0 EZ CLN MEGD (MISCELLANEOUS) IMPLANT
ELECTRODE REM PT RTRN 9FT ADLT (ELECTROSURGICAL) ×1 IMPLANT
ELIMINATOR HOLE APEX DEPUY (Hips) ×3 IMPLANT
FACESHIELD WRAPAROUND (MASK) ×6 IMPLANT
GLOVE BIOGEL PI IND STRL 8 (GLOVE) ×2 IMPLANT
GLOVE BIOGEL PI INDICATOR 8 (GLOVE) ×4
GLOVE ECLIPSE 8.0 STRL XLNG CF (GLOVE) ×3 IMPLANT
GLOVE ORTHO TXT STRL SZ7.5 (GLOVE) ×6 IMPLANT
GOWN STRL REUS W/ TWL XL LVL3 (GOWN DISPOSABLE) ×2 IMPLANT
GOWN STRL REUS W/TWL XL LVL3 (GOWN DISPOSABLE) ×4
HANDPIECE INTERPULSE COAX TIP (DISPOSABLE) ×2
KIT BASIN OR (CUSTOM PROCEDURE TRAY) ×3 IMPLANT
KIT ROOM TURNOVER OR (KITS) ×3 IMPLANT
MANIFOLD NEPTUNE II (INSTRUMENTS) ×3 IMPLANT
NS IRRIG 1000ML POUR BTL (IV SOLUTION) ×3 IMPLANT
PACK TOTAL JOINT (CUSTOM PROCEDURE TRAY) ×3 IMPLANT
PACK UNIVERSAL I (CUSTOM PROCEDURE TRAY) ×3 IMPLANT
PAD ARMBOARD 7.5X6 YLW CONV (MISCELLANEOUS) ×6 IMPLANT
RTRCTR WOUND ALEXIS 18CM MED (MISCELLANEOUS) ×3
SET HNDPC FAN SPRY TIP SCT (DISPOSABLE) ×1 IMPLANT
SPONGE LAP 4X18 X RAY DECT (DISPOSABLE) IMPLANT
STRIP CLOSURE SKIN 1/2X4 (GAUZE/BANDAGES/DRESSINGS) ×4 IMPLANT
SUT ETHIBOND NAB CT1 #1 30IN (SUTURE) ×3 IMPLANT
SUT MNCRL AB 4-0 PS2 18 (SUTURE) ×3 IMPLANT
SUT VIC AB 0 CT1 27 (SUTURE) ×2
SUT VIC AB 0 CT1 27XBRD ANBCTR (SUTURE) ×1 IMPLANT
SUT VIC AB 1 CT1 27 (SUTURE) ×2
SUT VIC AB 1 CT1 27XBRD ANBCTR (SUTURE) ×1 IMPLANT
SUT VIC AB 2-0 CT1 27 (SUTURE) ×2
SUT VIC AB 2-0 CT1 TAPERPNT 27 (SUTURE) ×1 IMPLANT
TOWEL OR 17X24 6PK STRL BLUE (TOWEL DISPOSABLE) ×3 IMPLANT
TOWEL OR 17X26 10 PK STRL BLUE (TOWEL DISPOSABLE) ×3 IMPLANT
TRAY FOLEY CATH 16FRSI W/METER (SET/KITS/TRAYS/PACK) IMPLANT
WATER STERILE IRR 1000ML POUR (IV SOLUTION) ×6 IMPLANT

## 2014-06-17 NOTE — Anesthesia Postprocedure Evaluation (Signed)
  Anesthesia Post-op Note  Patient: [name]  Procedure(s) Performed: Procedure(s): LEFT TOTAL HIP ARTHROPLASTY ANTERIOR APPROACH (Left)  Patient Location: PACU  Anesthesia Type:General  Level of Consciousness: awake  Airway and Oxygen Therapy: Patient Spontanous Breathing and Patient connected to nasal cannula oxygen  Post-op Pain: mild  Post-op Assessment: Post-op Vital signs reviewed  Post-op Vital Signs: Reviewed and stable  Last Vitals:  Filed Vitals:   06/17/14 1100  BP: 122/50  Pulse: 69  Temp: 36.6 C  Resp: 18    Complications: No apparent anesthesia complications

## 2014-06-17 NOTE — Anesthesia Procedure Notes (Signed)
Procedure Name: Intubation Date/Time: 06/17/2014 12:44 PM Performed by: Williemae Area B Pre-anesthesia Checklist: Patient identified, Emergency Drugs available, Suction available and Patient being monitored Patient Re-evaluated:Patient Re-evaluated prior to inductionOxygen Delivery Method: Circle system utilized Preoxygenation: Pre-oxygenation with 100% oxygen Intubation Type: IV induction Ventilation: Mask ventilation without difficulty Laryngoscope Size: Mac and 3 Grade View: Grade II Tube type: Oral Tube size: 7.5 mm Number of attempts: 1 (Dr. Tresa Moore, without moving pt's head or neck) Airway Equipment and Method: Stylet Placement Confirmation: ETT inserted through vocal cords under direct vision,  positive ETCO2 and breath sounds checked- equal and bilateral Secured at: 21 (cm at teeth) cm Tube secured with: Tape Dental Injury: Teeth and Oropharynx as per pre-operative assessment

## 2014-06-17 NOTE — Brief Op Note (Signed)
06/17/2014  2:23 PM  PATIENT:  [name]  64 y.o. female  PRE-OPERATIVE DIAGNOSIS:  Osteoarthritis left hip  POST-OPERATIVE DIAGNOSIS:  Osteoarthritis left hip  PROCEDURE:  Procedure(s): LEFT TOTAL HIP ARTHROPLASTY ANTERIOR APPROACH (Left)  SURGEON:  Surgeon(s) and Role:    * Mcarthur Rossetti, MD - Primary  PHYSICIAN ASSISTANT: Benita Stabile, PA-C  ANESTHESIA:   general  EBL:  Total I/O In: 0156 [I.V.:1050] Out: 725 [Urine:225; Blood:500]  BLOOD ADMINISTERED:none  DRAINS: none   LOCAL MEDICATIONS USED:  NONE  SPECIMEN:  No Specimen  DISPOSITION OF SPECIMEN:  N/A  COUNTS:  YES  TOURNIQUET:  * No tourniquets in log *  DICTATION: .Other Dictation: Dictation Number [id number]  PLAN OF CARE: Admit to inpatient   PATIENT DISPOSITION:  PACU - hemodynamically stable.   Delay start of Pharmacological VTE agent (>24hrs) due to surgical blood loss or risk of bleeding: no

## 2014-06-17 NOTE — Progress Notes (Signed)
Utilization review completed.  

## 2014-06-17 NOTE — H&P (Signed)
TOTAL HIP ADMISSION H&P  Patient is admitted for left total hip arthroplasty.  Subjective:  Chief Complaint: left hip pain  HPI: [name], 64 y.o. female, has a history of pain and functional disability in the left hip(s) due to arthritis and patient has failed non-surgical conservative treatments for greater than 12 weeks to include NSAID's and/or analgesics, use of assistive devices, weight reduction as appropriate and activity modification.  Onset of symptoms was gradual starting 10 years ago with gradually worsening course since that time.The patient noted no past surgery on the left hip(s).  Patient currently rates pain in the left hip at 10 out of 10 with activity. Patient has night pain, worsening of pain with activity and weight bearing, trendelenberg gait, pain that interfers with activities of daily living and pain with passive range of motion. Patient has evidence of subchondral cysts, subchondral sclerosis, periarticular osteophytes and joint space narrowing by imaging studies. This condition presents safety issues increasing the risk of falls.  There is no current active infection.  Patient Active Problem List   Diagnosis Date Noted  . Osteoarthritis of left hip 06/17/2014  . Contracture of left hip 06/03/2014  . Preoperative cardiovascular examination 04/22/2014  . PAF (paroxysmal atrial fibrillation) 04/22/2014  . Long term current use of antiarrhythmic drug 04/22/2014  . History of uterine cancer 04/22/2014  . Osteoarthritis, hip, bilateral 03/25/2014  . Lumbar and sacral osteoarthritis 12/10/2013   Past Medical History  Diagnosis Date  . Hypertension   . Swelling of both lower extremities   . Atrial fibrillation   . GERD (gastroesophageal reflux disease)   . Melanoma   . Skin cancer   . Dysrhythmia     dr Debara Pickett  . Scoliosis   . Difficult intubation     pt states that her neck needs to be in hyperextion, scoliosis    Past Surgical History  Procedure  Laterality Date  . Abdominal hysterectomy  09/10/2013  . Melanoma excision  12/2011  . Vein surgery  05/2011    venous ablation   . Bunionectomy  10/1976  . Fracture surgery  09/1964    Prescriptions prior to admission  Medication Sig Dispense Refill Last Dose  . aspirin 325 MG tablet Take 325 mg by mouth daily.   06/16/2014 at Unknown time  . diltiazem (TIAZAC) 240 MG 24 hr capsule Take 1 capsule (240 mg total) by mouth daily. 90 capsule 3 06/17/2014 at Unknown time  . flecainide (TAMBOCOR) 50 MG tablet Take 1 tablet (50 mg total) by mouth 2 (two) times daily. 180 tablet 3 06/17/2014 at Unknown time  . gabapentin (NEURONTIN) 600 MG tablet Take 1 tablet (600 mg total) by mouth 3 (three) times daily. 3 tablet 1 Past Month at Unknown time  . ibuprofen (ADVIL,MOTRIN) 600 MG tablet Take 600 mg by mouth every 6 (six) hours as needed for moderate pain.    Taking  . pantoprazole (PROTONIX) 40 MG tablet Take 40 mg by mouth daily.   1 06/17/2014 at Unknown time  . senna (SENOKOT) 8.6 MG tablet Take 1 tablet by mouth daily.   Past Week at Unknown time  . spironolactone (ALDACTONE) 50 MG tablet Take 50 mg by mouth 2 (two) times daily.   06/16/2014 at Unknown time  . traMADol (ULTRAM) 50 MG tablet Take 1 tablet (50 mg total) by mouth 3 (three) times daily as needed (Take 1 tablet every 8 hours as needed for pain). 90 tablet 1 06/16/2014 at Unknown time  .  triamcinolone cream (KENALOG) 0.1 % Apply 1 application topically as needed (for dry skin).   1 Taking   Allergies  Allergen Reactions  . Hydrolyzed Silk Hives and Other (See Comments)    Silk tape-blisters  . Gluten Meal Other (See Comments)    reflux    History  Substance Use Topics  . Smoking status: Never Smoker   . Smokeless tobacco: Never Used  . Alcohol Use: 0.0 oz/week    0 Standard drinks or equivalent per week     Comment: occ    Family History  Problem Relation Age of Onset  . Uterine cancer Mother   . Diabetes Brother 36  . Hypertension  Father 69  . Hyperlipidemia Father   . Cancer Father 66  . Epilepsy Father 26  . Arthritis Sister 30  . Cancer Maternal Grandmother   . Stroke Paternal Grandfather   . Arthritis Sister      Review of Systems  Musculoskeletal: Positive for back pain and joint pain.  All other systems reviewed and are negative.   Objective:  Physical Exam  Constitutional: She is oriented to person, place, and time. She appears well-developed and well-nourished.  HENT:  Head: Normocephalic and atraumatic.  Eyes: EOM are normal. Pupils are equal, round, and reactive to light.  Neck: Normal range of motion. Neck supple.  Cardiovascular: Normal rate and regular rhythm.   Respiratory: Effort normal and breath sounds normal.  GI: Soft. Bowel sounds are normal.  Musculoskeletal:       Left hip: She exhibits decreased range of motion, decreased strength, tenderness and bony tenderness.  Neurological: She is alert and oriented to person, place, and time.  Skin: Skin is warm and dry.  Psychiatric: She has a normal mood and affect.    Vital signs in last 24 hours: Temp:  [97.9 F (36.6 C)] 97.9 F (36.6 C) (04/05 1100) Pulse Rate:  [69] 69 (04/05 1100) Resp:  [18] 18 (04/05 1100) BP: (122)/(50) 122/50 mmHg (04/05 1100) SpO2:  [98 %] 98 % (04/05 1100) Weight:  [101.878 kg (224 lb 9.6 oz)] 101.878 kg (224 lb 9.6 oz) (04/05 1100)  Labs:   Estimated body mass index is 31.76 kg/(m^2) as calculated from the following:   Height as of this encounter: 5' 10.5" (1.791 m).   Weight as of this encounter: 101.878 kg (224 lb 9.6 oz).   Imaging Review Plain radiographs demonstrate severe degenerative joint disease of the left hip(s). The bone quality appears to be good for age and reported activity level.  Assessment/Plan:  End stage arthritis, left hip(s)  The patient history, physical examination, clinical judgement of the provider and imaging studies are consistent with end stage degenerative joint  disease of the left hip(s) and total hip arthroplasty is deemed medically necessary. The treatment options including medical management, injection therapy, arthroscopy and arthroplasty were discussed at length. The risks and benefits of total hip arthroplasty were presented and reviewed. The risks due to aseptic loosening, infection, stiffness, dislocation/subluxation,  thromboembolic complications and other imponderables were discussed.  The patient acknowledged the explanation, agreed to proceed with the plan and consent was signed. Patient is being admitted for inpatient treatment for surgery, pain control, PT, OT, prophylactic antibiotics, VTE prophylaxis, progressive ambulation and ADL's and discharge planning.The patient is planning to be discharged home with home health services

## 2014-06-17 NOTE — Transfer of Care (Signed)
Immediate Anesthesia Transfer of Care Note  Patient: [name]  Procedure(s) Performed: Procedure(s): LEFT TOTAL HIP ARTHROPLASTY ANTERIOR APPROACH (Left)  Patient Location: PACU  Anesthesia Type:General  Level of Consciousness: awake and patient cooperative  Airway & Oxygen Therapy: Patient Spontanous Breathing and Patient connected to nasal cannula oxygen  Post-op Assessment: Report given to RN, Post -op Vital signs reviewed and stable and Patient moving all extremities  Post vital signs: Reviewed and stable  Last Vitals:  Filed Vitals:   06/17/14 1500  BP: 131/62  Pulse: 77  Temp: 36.1 C  Resp:     Complications: No apparent anesthesia complications

## 2014-06-18 ENCOUNTER — Encounter (HOSPITAL_COMMUNITY): Payer: Self-pay | Admitting: Orthopaedic Surgery

## 2014-06-18 LAB — CBC
HEMATOCRIT: 32.6 % — AB (ref 36.0–46.0)
HEMOGLOBIN: 10.9 g/dL — AB (ref 12.0–15.0)
MCH: 33.5 pg (ref 26.0–34.0)
MCHC: 33.4 g/dL (ref 30.0–36.0)
MCV: 100.3 fL — AB (ref 78.0–100.0)
Platelets: 226 10*3/uL (ref 150–400)
RBC: 3.25 MIL/uL — AB (ref 3.87–5.11)
RDW: 12.3 % (ref 11.5–15.5)
WBC: 15.2 10*3/uL — ABNORMAL HIGH (ref 4.0–10.5)

## 2014-06-18 LAB — BASIC METABOLIC PANEL
Anion gap: 9 (ref 5–15)
BUN: 16 mg/dL (ref 6–23)
CALCIUM: 8.9 mg/dL (ref 8.4–10.5)
CO2: 26 mmol/L (ref 19–32)
Chloride: 100 mmol/L (ref 96–112)
Creatinine, Ser: 0.73 mg/dL (ref 0.50–1.10)
GFR calc Af Amer: >90 mL/min (ref >90)
GFR calc non Af Amer: 88 mL/min — ABNORMAL LOW (ref >90)
GLUCOSE: 145 mg/dL — AB (ref 70–99)
POTASSIUM: 4.3 mmol/L (ref 3.5–5.1)
Sodium: 135 mmol/L (ref 135–145)

## 2014-06-18 NOTE — Progress Notes (Signed)
Subjective: 1 Day Post-Op Procedure(s) (LRB): LEFT TOTAL HIP ARTHROPLASTY ANTERIOR APPROACH (Left) Patient reports pain as moderate.  Some nausea this AM otherwise doing well.  Objective: Vital signs in last 24 hours: Temp:  [97 F (36.1 C)-97.9 F (36.6 C)] 97.7 F (36.5 C) (04/06 0515) Pulse Rate:  [58-77] 65 (04/06 0515) Resp:  [9-19] 16 (04/06 0515) BP: (113-131)/(50-63) 126/59 mmHg (04/06 0515) SpO2:  [93 %-100 %] 97 % (04/06 0515) Weight:  [101.878 kg (224 lb 9.6 oz)] 101.878 kg (224 lb 9.6 oz) (04/05 1100)  Intake/Output from previous day: 04/05 0701 - 04/06 0700 In: 3455 [P.O.:720; I.V.:2435; IV Piggyback:300] Out: 1750 [Urine:1250; Blood:500] Intake/Output this shift:     Recent Labs  06/18/14 0517  HGB 10.9*    Recent Labs  06/18/14 0517  WBC 15.2*  RBC 3.25*  HCT 32.6*  PLT 226    Recent Labs  06/18/14 0517  NA 135  K 4.3  CL 100  CO2 26  BUN 16  CREATININE 0.73  GLUCOSE 145*  CALCIUM 8.9   No results for input(s): LABPT, INR in the last 72 hours.  Sensation intact distally Intact pulses distally Dorsiflexion/Plantar flexion intact Incision: dressing C/D/I Compartment soft  Assessment/Plan: 1 Day Post-Op Procedure(s) (LRB): LEFT TOTAL HIP ARTHROPLASTY ANTERIOR APPROACH (Left) Up with therapy  Monitor HgB and for symptoms of anemia  CLARK, GILBERT 06/18/2014, 7:58 AM

## 2014-06-18 NOTE — Evaluation (Addendum)
Occupational Therapy Evaluation Patient Details Name: [name] MRN: [id number] DOB: 03-Feb-1951 Today's Date: 06/18/2014    History of Present Illness Patient is a 64 y/o female s/p L THA. PMH of HTN, A-fib and skin cancer.   Clinical Impression   Pt s/p above. Feel pt will benefit from acute OT to increase independence prior to d/c. Plan to practice tub transfer next session.     Follow Up Recommendations  No OT follow up;Supervision - Intermittent    Equipment Recommendations  3 in 1 bedside comode;Other (comment) (AE)    Recommendations for Other Services       Precautions / Restrictions Precautions Precautions: Fall Precaution Comments: Direct anterior approach. Restrictions Weight Bearing Restrictions: Yes LLE Weight Bearing: Weight bearing as tolerated      Mobility Bed Mobility   General bed mobility comments: not assessed  Transfers Overall transfer level: Needs assistance Transfers: Sit to/from Stand Sit to Stand: Supervision           Balance Min guard for ambulation with RW.                   ADL Overall ADL's : Needs assistance/impaired     Grooming: Wash/dry hands;Set up;Standing;Supervision/safety               Lower Body Dressing: Minimal assistance;With adaptive equipment;Sit to/from stand   Toilet Transfer: Min guard;Ambulation;RW (3 in 1 over commode)   Toileting- Clothing Manipulation and Hygiene: Sit to/from stand;Supervision/safety       Functional mobility during ADLs: Min guard;Rolling walker General ADL Comments: Educated on LB dressing technique. Educated on AE/cost/where she can purchase. Educated on safety such as safe footwear, pet, and use of bag on walker.. Educated on tub transfer techniques.  Pt practiced with AE. Pt reports that getting to LB was difficult before surgery.     Vision     Perception     Praxis      Pertinent Vitals/Pain Pain Assessment: 0-10 Pain Score: 4  Pain Location: left  hip Pain Intervention(s): Monitored during session;Repositioned     Hand Dominance     Extremity/Trunk Assessment Upper Extremity Assessment Upper Extremity Assessment: Overall WFL for tasks assessed   Lower Extremity Assessment Lower Extremity Assessment: Defer to PT evaluation       Communication Communication Communication: No difficulties   Cognition Arousal/Alertness: Awake/alert Behavior During Therapy: WFL for tasks assessed/performed Overall Cognitive Status: Within Functional Limits for tasks assessed                     General Comments       Exercises       Shoulder Instructions      Home Living Family/patient expects to be discharged to:: Private residence Living Arrangements: Alone Available Help at Discharge: Family;Available PRN/intermittently (thinks daughter can check in on her) Type of Home: Apartment Home Access: Level entry     Home Layout: One level     Bathroom Shower/Tub: Teacher, early years/pre: Standard     Home Equipment: Grab bars - tub/shower (pt thinks she has access to shower chair)   Additional Comments: Pt very active PTA. Recently moved from [place]. USe to walk 45 mins each morning.       Prior Functioning/Environment Level of Independence: Independent             OT Diagnosis: Acute pain   OT Problem List: Decreased range of motion;Decreased knowledge of precautions;Decreased knowledge of use of  DME or AE;Pain   OT Treatment/Interventions: Self-care/ADL training;DME and/or AE instruction;Therapeutic activities;Patient/family education;Balance training    OT Goals(Current goals can be found in the care plan section) Acute Rehab OT Goals Patient Stated Goal: not stated OT Goal Formulation: With patient Time For Goal Achievement: 06/25/14 Potential to Achieve Goals: Good ADL Goals Pt Will Perform Lower Body Dressing: with modified independence;with adaptive equipment;sit to/from stand Pt Will  Transfer to Toilet: with modified independence;ambulating (3 in 1 over commode) Pt Will Perform Tub/Shower Transfer: Tub transfer;with supervision;ambulating;rolling walker (3 in 1 versus shower chair)  OT Frequency: Min 2X/week   Barriers to D/C:            Co-evaluation              End of Session Equipment Utilized During Treatment: Gait belt;Rolling walker Nurse Communication: Mobility status  Activity Tolerance: Patient tolerated treatment well Patient left: in chair;with call bell/phone within reach   Time: 1652-1709 OT Time Calculation (min): 17 min Charges:  OT General Charges $OT Visit: 1 Procedure OT Evaluation $Initial OT Evaluation Tier I: 1 Procedure G-CodesBenito Mccreedy OTR/L [id number] 06/18/2014, 5:25 PM

## 2014-06-18 NOTE — Op Note (Signed)
[name], [name] NO.:  [id number]  MEDICAL RECORD NO.:  [id number]  LOCATION:  5N24C                        FACILITY:  [facility]  PHYSICIAN:  Lind Guest. Ninfa Linden, M.D.DATE OF BIRTH:  Jul 09, 1950  DATE OF PROCEDURE:  06/17/2014 DATE OF DISCHARGE:                              OPERATIVE REPORT   PREOPERATIVE DIAGNOSES:  Primary osteoarthritis and degenerative joint disease, left hip.  POSTOPERATIVE DIAGNOSES:  Primary osteoarthritis and degenerative joint disease, left hip.  PROCEDURE:  Left total hip arthroplasty using direct anterior approach.  IMPLANTS:  DePuy Sector Gription acetabular component size 54, size 36+ 4 neutral polyethylene liner, size 14 Corail femoral component with varus offset (KLA), size 36+ 1.5 ceramic hip ball.  SURGEON:  Lind Guest. Ninfa Linden, M.D.  ASSISTANT:  [name], P.A.  ANESTHESIA:  General.  ANTIBIOTICS:  2 g of IV Ancef.  BLOOD LOSS:  500 mL.  COMPLICATIONS:  None.  INDICATIONS:  [name] is a very active 64 year old female who is 5 feet and 10 inches and has debilitating arthritis involving both her hips with the left worse than right.  This has affected her back and her activities of daily living.  Her pain is daily.  Her mobility started to become limited and at that point, she wished to proceed with a total hip arthroplasty.  She would like to have this through a direct anterior approach.  She understands fully the risks of acute blood loss anemia, nerve and vessel injury, fracture, infection, dislocation, and DVT.  She understands the goals are decreased pain, improved mobility, and overall improved quality of life.  PROCEDURE DESCRIPTION:  After informed consent was obtained, appropriate left hip was marked.  She was brought to the operating room and general anesthesia was obtained while she was on a stretcher.  A Foley catheter was placed and then both feet had traction boots applied to them.   Next, she was placed supine on the HANA fracture table with the perineal post in place and both legs in inline skeletal traction devices, but no traction applied.  Her left operative hip was prepped and draped with DuraPrep and sterile drapes.  Time-out was called, and she was identified as correct patient, correct left hip.  I then made an incision inferior and posterior to the anterior superior iliac spine and carried this obliquely down the leg.  I dissected down to the tensor fascia lata muscle and then tensor fascia was divided longitudinally, so I could proceed with direct anterior approach to the hip.  I identified and cauterized the lateral femoral circumflex vessels and then placed a Cobra retractor around the lateral neck and up underneath the rectus femoris, a Cobra retractor around the medial femoral neck.  I then opened up the hip capsule in a L-type format exposing the femoral neck. I placed Cobra retractors within the hip capsule.  I then made my femoral neck cut with an oscillating saw proximal to the lesser trochanter and completed this with an osteotome.  I placed a corkscrew guide in the femoral head and removed the femoral head in its entirety and found it to be very large and devoid of cartilage completely.  I then  cleaned the acetabular remnants of acetabular labrum and debris.  I placed a bent Hohmann over the medial acetabulum and then began reaming under direct visualization from a size 42 reamer in 2 mm increments all the way up to a size 54, with the last 2 reamers also under direct fluoroscopy so I could obtain my depth of reaming, our inclination, and anteversion.  Once I was pleased with this, I placed the real DePuy Sector Gription acetabular component size 54, but I did  not place the apex hole eliminator guide, I placed the real 36+ 4 neutral polyethylene liner for size 54 acetabular component.  Attention was then turned to the femur.  With the leg  externally rotated to 100 degrees, extended and adducted, I was able to place the Mueller retractor medially and a Hohmann retractor behind the greater trochanter.  I released the lateral joint capsule and used a box cutting osteotome to enter the femoral canal and a rongeur to lateralize.  We then broached from a size 8 broach using Corail broaching system up to a size 14.  With the size 14 in place, we trialed a varus offset neck with a 36+ 1.5 hip ball given how tight she was and reduced this in the acetabulum and it was stable and her leg lengths were measured near equal.  I was pleased with the rotation and stability overall.  I also was pleased with her shuck and her offset.  I then dislocated the hip and removed the trial components. We then placed the real Corail femoral component size 14 with varus offset and the real 36+ 1.5 ceramic hip ball, reduced this in the acetabulum, again I was placed with stability and I then copiously irrigated the soft tissues with normal saline solution using pulsatile lavage.  We then did not have joint capsule really closed, so we closed the iliotibial band with interrupted #1 Ethibond suture followed by 0- Vicryl in the deep tissue, 2-0 Vicryl in the subcutaneous tissue, 4-0 Monocryl on the skin, and a few nylon sutures.  Steri-Strips and Aquacel dressing was applied.  She was then taken off the HANA table, awakened, extubated, and taken to the recovery room in stable condition.  All final counts were correct.  There were no complications noted.  Of note, [name] PA-C assisted the entire case and his assistance was crucial for facilitating all aspects of this case.     Lind Guest. Ninfa Linden, M.D.     CYB/MEDQ  D:  06/17/2014  T:  06/18/2014  Job:  [id number]

## 2014-06-18 NOTE — Evaluation (Addendum)
Physical Therapy Evaluation Patient Details Name: [name] MRN: [id number] DOB: [birth year] Today's Date: 06/18/2014   History of Present Illness  Patient is a 64 y/o female s/p L THA. PMH of HTN, A-fib and skin ca.  Clinical Impression  Patient presents with pain, nausea, weakness and balance deficits s/p left THA impacting mobility. Education provided on HEP to perform during the day. Tolerated short ambulation distance with + emesis secondary to nausea. Pt would benefit from skilled PT to improve transfers, gait, balance and mobility so pt can maximize independence and return to PLOF. Pt will need to be Mod I with mobility prior to d/c as pt lives at home alone.   Follow Up Recommendations Home health PT;Supervision/Assistance - 24 hour    Equipment Recommendations  Rolling walker with 5" wheels;3in1 (PT)    Recommendations for Other Services OT consult     Precautions / Restrictions Precautions Precautions: Fall Precaution Comments: Direct anterior approach. Restrictions Weight Bearing Restrictions: Yes LLE Weight Bearing: Weight bearing as tolerated      Mobility  Bed Mobility               General bed mobility comments: Not assessed as pt in bathroom upon PT arrival.   Transfers Overall transfer level: Needs assistance Equipment used: Rolling walker (2 wheeled) Transfers: Sit to/from Stand Sit to Stand: Min guard         General transfer comment: Min guard to stand from toilet x2, from bed x1 iwith cues for hand placement.   Ambulation/Gait Ambulation/Gait assistance: Min guard Ambulation Distance (Feet): 16 Feet Assistive device: Rolling walker (2 wheeled) Gait Pattern/deviations: Step-through pattern;Decreased stride length;Trunk flexed;Decreased stance time - left;Decreased step length - right   Gait velocity interpretation: Below normal speed for age/gender General Gait Details: Pt with slow, and mildly unsteady gait. + emesis upon leaving  bathroom requiring need to sit. Cues for safety with RW.   Stairs            Wheelchair Mobility    Modified Rankin (Stroke Patients Only)       Balance Overall balance assessment: Needs assistance Sitting-balance support: Feet supported;No upper extremity supported Sitting balance-Leahy Scale: Good     Standing balance support: During functional activity Standing balance-Leahy Scale: Fair Standing balance comment: Able to perform pericare and donn underwear without LOB.                             Pertinent Vitals/Pain Pain Assessment: Faces Faces Pain Scale: Hurts a little bit Pain Location: left hip Pain Descriptors / Indicators: Sore Pain Intervention(s): Monitored during session;Repositioned    Home Living Family/patient expects to be discharged to:: Private residence Living Arrangements: Alone   Type of Home: Apartment Home Access: Level entry     Home Layout: One level   Additional Comments: Pt very active PTA. Recently moved from [place]. USe to walk 45 mins each morning.     Prior Function Level of Independence: Independent               Hand Dominance        Extremity/Trunk Assessment   Upper Extremity Assessment: Defer to OT evaluation           Lower Extremity Assessment: Generalized weakness;LLE deficits/detail   LLE Deficits / Details: Limited AROM hip flexion secondary to pain, AROM WFL ankle, knee.     Communication   Communication: No difficulties  Cognition Arousal/Alertness: Awake/alert Behavior  During Therapy: WFL for tasks assessed/performed Overall Cognitive Status: Within Functional Limits for tasks assessed                      General Comments      Exercises Total Joint Exercises Ankle Circles/Pumps: Both;15 reps;Seated Quad Sets: Both;10 reps;Seated Gluteal Sets: Both;10 reps;Seated Long Arc Quad: Both;10 reps;Seated      Assessment/Plan    PT Assessment Patient needs continued PT  services  PT Diagnosis Difficulty walking;Generalized weakness;Acute pain   PT Problem List Decreased strength;Pain;Decreased range of motion;Decreased activity tolerance;Decreased balance;Decreased mobility  PT Treatment Interventions Balance training;Gait training;Functional mobility training;Patient/family education;Therapeutic activities;Therapeutic exercise;DME instruction   PT Goals (Current goals can be found in the Care Plan section) Acute Rehab PT Goals Patient Stated Goal: to be able to help care for my new grandbabies (twins) who are not born yet.  PT Goal Formulation: With patient Time For Goal Achievement: 07/02/14 Potential to Achieve Goals: Good    Frequency 7X/week BID   Barriers to discharge Decreased caregiver support Pt lives alone.     Co-evaluation               End of Session Equipment Utilized During Treatment: Gait belt Activity Tolerance: Patient tolerated treatment well Patient left: in chair;with call bell/phone within reach Nurse Communication: Mobility status         Time: 4360-6770 PT Time Calculation (min) (ACUTE ONLY): 27 min   Charges:   PT Evaluation $Initial PT Evaluation Tier I: 1 Procedure PT Treatments $Self Care/Home Management: 8-22   PT G CodesCandy Sledge A 07/07/14, 12:12 PM  Candy Sledge, [place], DPT [phone]

## 2014-06-18 NOTE — Progress Notes (Signed)
Physical Therapy Treatment Patient Details Name: [name] MRN: [id number] DOB: 1950/06/09 Today's Date: 06/18/2014    History of Present Illness Patient is a 64 y/o female s/p L THA. PMH of HTN, A-fib and skin ca.    PT Comments    Patient progressing well with mobility. Improved ambulation distance today. Reviewed HEP and provided handout with exercises to perform twice/day. Pt continues to have some pain and mild balance deficits impacting mobility. Will continue to follow per current POC.   Follow Up Recommendations  Home health PT;Supervision/Assistance - 24 hour     Equipment Recommendations  Rolling walker with 5" wheels;3in1 (PT)    Recommendations for Other Services OT consult     Precautions / Restrictions Precautions Precautions: Fall Precaution Comments: Direct anterior approach. Restrictions Weight Bearing Restrictions: Yes LLE Weight Bearing: Weight bearing as tolerated    Mobility  Bed Mobility Overal bed mobility: Needs Assistance Bed Mobility: Sit to Supine       Sit to supine: Supervision   General bed mobility comments: HOB flat, no use of rails to simulate home environment.   Transfers Overall transfer level: Needs assistance Equipment used: Rolling walker (2 wheeled) Transfers: Sit to/from Stand Sit to Stand: Min guard         General transfer comment: Min guard for safety.   Ambulation/Gait Ambulation/Gait assistance: Min guard Ambulation Distance (Feet): 150 Feet Assistive device: Rolling walker (2 wheeled) Gait Pattern/deviations: Step-through pattern;Decreased stride length;Trunk flexed;Antalgic   Gait velocity interpretation: Below normal speed for age/gender General Gait Details: Pt with slow, and mildly unsteady gait. Cues for upright posture and RW management.   Stairs            Wheelchair Mobility    Modified Rankin (Stroke Patients Only)       Balance Overall balance assessment: Needs  assistance Sitting-balance support: Feet supported;No upper extremity supported Sitting balance-Leahy Scale: Good     Standing balance support: During functional activity Standing balance-Leahy Scale: Fair Standing balance comment: Able toperform dynamic standing - washing hands and pericare without LOB or difficulty.                     Cognition Arousal/Alertness: Awake/alert Behavior During Therapy: WFL for tasks assessed/performed Overall Cognitive Status: Within Functional Limits for tasks assessed                      Exercises Total Joint Exercises Ankle Circles/Pumps: Both;15 reps;Seated Quad Sets: Left;5 reps;Supine Gluteal Sets: Both;10 reps;Seated Short Arc Quad: Left;10 reps;Supine Long Arc Quad: Both;10 reps;Seated    General Comments General comments (skin integrity, edema, etc.): Daughter present in room during PT session.      Pertinent Vitals/Pain Pain Assessment: Faces Faces Pain Scale: Hurts even more Pain Location: Left hip with mobility Pain Descriptors / Indicators: Sore;Aching Pain Intervention(s): Monitored during session;Repositioned;Patient requesting pain meds-RN notified    Home Living Family/patient expects to be discharged to:: Private residence Living Arrangements: Alone   Type of Home: Apartment Home Access: Level entry   Home Layout: One level   Additional Comments: Pt very active PTA. Recently moved from [place]. USe to walk 45 mins each morning.     Prior Function Level of Independence: Independent          PT Goals (current goals can now be found in the care plan section) Acute Rehab PT Goals Patient Stated Goal: to be able to help care for my new grandbabies (twins) who are  not born yet.  PT Goal Formulation: With patient Time For Goal Achievement: 07/02/14 Potential to Achieve Goals: Good Progress towards PT goals: Progressing toward goals    Frequency  7X/week (BID)    PT Plan Current plan remains  appropriate    Co-evaluation             End of Session Equipment Utilized During Treatment: Gait belt Activity Tolerance: Patient tolerated treatment well Patient left: in bed;with call bell/phone within reach;with bed alarm set;with family/visitor present;with nursing/sitter in room     Time: 1440-1507 PT Time Calculation (min) (ACUTE ONLY): 27 min  Charges:  $Gait Training: 8-22 mins $Therapeutic Exercise: 8-22 mins $Self Care/Home Management: 2022/11/04                    G CodesCandy Sledge A 06-19-14, 3:14 PM Candy Sledge, [place], DPT [phone]

## 2014-06-19 DIAGNOSIS — M1612 Unilateral primary osteoarthritis, left hip: Principal | ICD-10-CM

## 2014-06-19 DIAGNOSIS — I480 Paroxysmal atrial fibrillation: Secondary | ICD-10-CM

## 2014-06-19 LAB — CBC
HCT: 28.6 % — ABNORMAL LOW (ref 36.0–46.0)
HEMOGLOBIN: 9.8 g/dL — AB (ref 12.0–15.0)
MCH: 33.9 pg (ref 26.0–34.0)
MCHC: 34.3 g/dL (ref 30.0–36.0)
MCV: 99 fL (ref 78.0–100.0)
Platelets: 183 10*3/uL (ref 150–400)
RBC: 2.89 MIL/uL — ABNORMAL LOW (ref 3.87–5.11)
RDW: 12.3 % (ref 11.5–15.5)
WBC: 9.2 10*3/uL (ref 4.0–10.5)

## 2014-06-19 MED ORDER — TIZANIDINE HCL 4 MG PO TABS
4.0000 mg | ORAL_TABLET | Freq: Four times a day (QID) | ORAL | Status: DC | PRN
Start: 2014-06-19 — End: 2014-07-08

## 2014-06-19 MED ORDER — ASPIRIN 325 MG PO TBEC: 325.0000 mg | DELAYED_RELEASE_TABLET | Freq: Two times a day (BID) | ORAL | Status: DC

## 2014-06-19 MED ORDER — OXYCODONE-ACETAMINOPHEN 5-325 MG PO TABS: 1.0000 | ORAL_TABLET | ORAL | Status: DC | PRN

## 2014-06-19 NOTE — Consult Note (Signed)
CARDIOLOGY CONSULT NOTE     Patient ID: [name] MRN: [id number] DOB/AGE: [birth year] 64 y.o.  Admit date: 06/17/2014 Referring Physician Jean Rosenthal MD Primary Physician Bartholome Bill, MD Primary Cardiologist Mali Hilty MD Reason for Consultation atrial fibrillation  HPI:  Pleasant 64 yo WF seen at the request of Dr. Ninfa Linden for evaluation of atrial fibrillation. She was last seen by Dr. Debara Pickett in February. She has been on Flecainide for about 3 years for treatment of AFib. This has done very well with only one episode up until March. She reports now about 5 episodes over the past 2 months. These typically occur at night. No clear triggers but thinks certain foods may be a trigger ? Pasta. Episodes last about 2 hours and are self terminating. No real chest pain, dizziness or syncope. With one episode she did break out in a sweat. She was admitted here for left THR and has no complications.   Past Medical History  Diagnosis Date  . Hypertension   . Swelling of both lower extremities   . Atrial fibrillation   . GERD (gastroesophageal reflux disease)   . Melanoma   . Skin cancer   . Dysrhythmia     dr Debara Pickett  . Scoliosis   . Difficult intubation     pt states that her neck needs to be in hyperextion, scoliosis    Family History  Problem Relation Age of Onset  . Uterine cancer Mother   . Diabetes Brother 71  . Hypertension Father 74  . Hyperlipidemia Father   . Cancer Father 41  . Epilepsy Father 20  . Arthritis Sister 73  . Cancer Maternal Grandmother   . Stroke Paternal Grandfather   . Arthritis Sister     History   Social History  . Marital Status: Divorced    Spouse Name: N/A  . Number of Children: 2  . Years of Education: JD   Occupational History  . lawyer    Social History Main Topics  . Smoking status: Never Smoker   . Smokeless tobacco: Never Used  . Alcohol Use: 0.0 oz/week    0 Standard drinks or equivalent per week     Comment: occ    . Drug Use: No  . Sexual Activity: Not on file   Other Topics Concern  . Not on file   Social History Narrative    Past Surgical History  Procedure Laterality Date  . Abdominal hysterectomy  09/10/2013  . Melanoma excision  12/2011  . Vein surgery  05/2011    venous ablation   . Bunionectomy  10/1976  . Fracture surgery  09/1964  . Total hip arthroplasty Left 06/17/2014    Procedure: LEFT TOTAL HIP ARTHROPLASTY ANTERIOR APPROACH;  Surgeon: Mcarthur Rossetti, MD;  Location: [facility];  Service: Orthopedics;  Laterality: Left;     Prescriptions prior to admission  Medication Sig Dispense Refill Last Dose  . aspirin 325 MG tablet Take 325 mg by mouth daily.   06/16/2014 at Unknown time  . diltiazem (TIAZAC) 240 MG 24 hr capsule Take 1 capsule (240 mg total) by mouth daily. 90 capsule 3 06/17/2014 at Unknown time  . flecainide (TAMBOCOR) 50 MG tablet Take 1 tablet (50 mg total) by mouth 2 (two) times daily. 180 tablet 3 06/17/2014 at Unknown time  . gabapentin (NEURONTIN) 600 MG tablet Take 1 tablet (600 mg total) by mouth 3 (three) times daily. 3 tablet 1 Past Month at Unknown time  . ibuprofen (  ADVIL,MOTRIN) 600 MG tablet Take 600 mg by mouth every 6 (six) hours as needed for moderate pain.    Taking  . pantoprazole (PROTONIX) 40 MG tablet Take 40 mg by mouth daily.   1 06/17/2014 at Unknown time  . senna (SENOKOT) 8.6 MG tablet Take 1 tablet by mouth daily.   Past Week at Unknown time  . spironolactone (ALDACTONE) 50 MG tablet Take 50 mg by mouth 2 (two) times daily.   06/16/2014 at Unknown time  . traMADol (ULTRAM) 50 MG tablet Take 1 tablet (50 mg total) by mouth 3 (three) times daily as needed (Take 1 tablet every 8 hours as needed for pain). 90 tablet 1 06/16/2014 at Unknown time  . triamcinolone cream (KENALOG) 0.1 % Apply 1 application topically as needed (for dry skin).   1 Taking     ROS: As noted in HPI. All other systems are reviewed and are negative unless otherwise mentioned.    Physical Exam: Blood pressure 116/58, pulse 81, temperature 99 F (37.2 C), temperature source Oral, resp. rate 18, height 5' 10.5" (1.791 m), weight 224 lb 9.6 oz (101.878 kg), SpO2 99 %. Current Weight  06/17/14 224 lb 9.6 oz (101.878 kg)  04/22/14 224 lb 6.4 oz (101.787 kg)  02/11/14 231 lb 3.2 oz (104.872 kg)    GENERAL:  Well appearing WF in NAD HEENT:  PERRL, EOMI, sclera are clear. Oropharynx is clear. NECK:  No jugular venous distention, carotid upstroke brisk and symmetric, no bruits, no thyromegaly or adenopathy LUNGS:  Clear to auscultation bilaterally CHEST:  Unremarkable HEART:  RRR,  PMI not displaced or sustained,S1 and S2 within normal limits, no S3, no S4: no clicks, no rubs, no murmurs ABD:  Soft, nontender. BS +, no masses or bruits. No hepatomegaly, no splenomegaly EXT:  2 + pulses throughout, no edema, no cyanosis no clubbing. Surgical incision left hip. SKIN:  Warm and dry.  No rashes NEURO:  Alert and oriented x 3. Cranial nerves II through XII intact. PSYCH:  Cognitively intact    Labs:   Lab Results  Component Value Date   WBC 9.2 06/19/2014   HGB 9.8* 06/19/2014   HCT 28.6* 06/19/2014   MCV 99.0 06/19/2014   PLT 183 06/19/2014    Recent Labs Lab 06/18/14 0517  NA 135  K 4.3  CL 100  CO2 26  BUN 16  CREATININE 0.73  CALCIUM 8.9  GLUCOSE 145*   No results found for: CKTOTAL, CKMB, CKMBINDEX, TROPONINI No results found for: CHOL No results found for: HDL No results found for: LDLCALC No results found for: TRIG No results found for: CHOLHDL No results found for: LDLDIRECT  No results found for: PROBNP No results found for: TSH No results found for: HGBA1C  Radiology: Dg Hip Port Unilat With Pelvis 1v Left  06/17/2014   CLINICAL DATA:  Status post left hip replacement  EXAM: LEFT HIP (WITH PELVIS) 1 VIEW PORTABLE  COMPARISON:  None.  FINDINGS: Left hip replacement is noted. No acute bony abnormality is seen. Air is noted within the  surgical bed. Degenerative changes of the right hip are noted.  IMPRESSION: Status post left hip replacement.  No acute abnormality is noted.   Electronically Signed   By: Inez Catalina M.D.   On: 06/17/2014 16:23   Dg Hip Operative Unilat With Pelvis Left  06/17/2014   CLINICAL DATA:  Left hip replacement  EXAM: OPERATIVE left HIP (WITH PELVIS IF PERFORMED) 2 VIEWS  TECHNIQUE: Fluoroscopic  spot image(s) were submitted for interpretation post-operatively.  COMPARISON:  None.  FINDINGS: 37 seconds of fluoroscopy was utilized. A left hip prosthesis is seen. No acute abnormality is noted.  IMPRESSION: Status post left hip replacement.   Electronically Signed   By: Inez Catalina M.D.   On: 06/17/2014 14:45    EKG: today shows NSR with normal ECG. I have personally reviewed and interpreted this study.   ASSESSMENT AND PLAN:  1. Paroxysmal AFib. CHAD-Vasc score of 1. Some recent increase in frequency of symptoms. All episodes terminate spontaneously.  2. S/p left THR 3. Osteoarthritis.  Plan; recommend she continue her current therapy with Flecainide 50 mg bid and diltiazem. She has a follow up appt. With Dr. Debara Pickett in June. If she continues to have more frequent breakthrough may need to consider increasing Flecainide dose.     Signed: Jahnay Lantier [place], [place]  06/19/2014, 12:52 PM

## 2014-06-19 NOTE — Progress Notes (Signed)
Physical Therapy Treatment Patient Details Name: RILYNN HABEL MRN: [id number] DOB: [birth year] Today's Date: 06/19/2014    History of Present Illness Patient is a 64 y/o female s/p L THA. PMH of HTN, A-fib and skin ca.    PT Comments    Pt is progressing toward all goals and increasing ambulation distance. Focus on progressive ambulation this afternoon.  Follow Up Recommendations  Home health PT;Supervision/Assistance - 24 hour     Equipment Recommendations  Rolling walker with 5" wheels;3in1 (PT)    Recommendations for Other Services OT consult     Precautions / Restrictions Precautions Precautions: Fall Precaution Comments: Direct anterior approach. Restrictions Weight Bearing Restrictions: Yes LLE Weight Bearing: Weight bearing as tolerated    Mobility  Bed Mobility Overal bed mobility: Modified Independent             General bed mobility comments: Pt in chair before and after  Transfers Overall transfer level: Modified independent Equipment used: Rolling walker (2 wheeled)   Sit to Stand: Supervision         General transfer comment: Pt able to complete with safe technique.  Ambulation/Gait Ambulation/Gait assistance: Supervision Ambulation Distance (Feet): 200 Feet Assistive device: Rolling walker (2 wheeled) Gait Pattern/deviations: Step-through pattern;Trunk flexed;Decreased stride length   Gait velocity interpretation: Below normal speed for age/gender General Gait Details: Cues for upright posture, WB through LEs, and keeping RW moving.   Stairs            Wheelchair Mobility    Modified Rankin (Stroke Patients Only)       Balance                                    Cognition Arousal/Alertness: Awake/alert Behavior During Therapy: WFL for tasks assessed/performed Overall Cognitive Status: Within Functional Limits for tasks assessed                      Exercises Total Joint Exercises Ankle  Circles/Pumps: AROM;Both;10 reps Quad Sets: AROM;Both;10 reps Heel Slides: AROM;Left;10 reps Long Arc Quad: AROM;Left;10 reps    General Comments        Pertinent Vitals/Pain Pain Assessment: 0-10 Pain Score: 4  Pain Location: L hip Pain Descriptors / Indicators: Sore Pain Intervention(s): Monitored during session    Home Living                      Prior Function            PT Goals (current goals can now be found in the care plan section) Acute Rehab PT Goals Patient Stated Goal: not stated Progress towards PT goals: Progressing toward goals    Frequency  7X/week    PT Plan Current plan remains appropriate    Co-evaluation             End of Session   Activity Tolerance: Patient tolerated treatment well Patient left: in chair;with call bell/phone within reach     Time: 0929-1000 PT Time Calculation (min) (ACUTE ONLY): 31 min  Charges:                       G CodesRubye Oaks, [place] 06/19/2014, 10:12 AM

## 2014-06-19 NOTE — Discharge Instructions (Signed)

## 2014-06-19 NOTE — Progress Notes (Signed)
Subjective: 2 Days Post-Op Procedure(s) (LRB): LEFT TOTAL HIP ARTHROPLASTY ANTERIOR APPROACH (Left) Patient reports pain as moderate but better than pre-op.  Denies SOB. Chest pain  or light headedness . History of paroxysmal atrial fibrillation. Reports more frequent episodes of atrial fibrillation. Appears comfortable sitting in chair.    Objective: Vital signs in last 24 hours: Temp:  [98.1 F (36.7 C)-99 F (37.2 C)] 99 F (37.2 C) (04/07 0558) Pulse Rate:  [94-144] 144 (04/07 0558) Resp:  [18] 18 (04/07 0558) BP: (105-155)/(63-76) 105/63 mmHg (04/07 0558) SpO2:  [98 %-99 %] 99 % (04/07 0558)  Intake/Output from previous day:   Intake/Output this shift:     Recent Labs  06/18/14 0517 06/19/14 0638  HGB 10.9* 9.8*    Recent Labs  06/18/14 0517 06/19/14 0638  WBC 15.2* 9.2  RBC 3.25* 2.89*  HCT 32.6* 28.6*  PLT 226 183    Recent Labs  06/18/14 0517  NA 135  K 4.3  CL 100  CO2 26  BUN 16  CREATININE 0.73  GLUCOSE 145*  CALCIUM 8.9   No results for input(s): LABPT, INR in the last 72 hours.  Left hip: Calf supple non tender Foot NVI  Dressing clean dry and intact. Dorsiflexion plantar flexion intact  Assessment/Plan: 2 Days Post-Op Procedure(s) (LRB): LEFT TOTAL HIP ARTHROPLASTY ANTERIOR APPROACH (Left) Up with therapy  Acute blood loss anemia secondary to surgery will monitor for symptoms. History of Atrial fibrillation will  Get cardiac evaluation and EKG   Audrea Bolte 06/19/2014, 8:15 AM

## 2014-06-19 NOTE — Progress Notes (Signed)
Occupational Therapy Treatment Patient Details Name: [name] MRN: [id number] DOB: 1951-02-08 Today's Date: 06/19/2014    History of present illness Patient is a 64 y/o female s/p L THA. PMH of HTN, A-fib and skin ca.   OT comments  Pt is at adequate level for d/c home. Pt will need DME for home listed below. Pt will have (A) from daughters for grocery store and doctor appointment follow up. Neighbor will (A) with small dog "zoey".    Follow Up Recommendations  No OT follow up;Supervision - Intermittent    Equipment Recommendations  3 in 1 bedside comode;Tub/shower bench;Other (comment) (ae)    Recommendations for Other Services      Precautions / Restrictions Precautions Precautions: Fall Precaution Comments: Direct anterior approach.       Mobility Bed Mobility Overal bed mobility: Modified Independent                Transfers Overall transfer level: Modified independent                    Balance                                   ADL Overall ADL's : Needs assistance/impaired Eating/Feeding: Independent   Grooming: Wash/dry hands;Wash/dry face;Modified independent           Upper Body Dressing : Modified independent   Lower Body Dressing: Supervision/safety;Sit to/from stand;With adaptive equipment Lower Body Dressing Details (indicate cue type and reason): don doff underwear and pants Toilet Transfer: Supervision/safety;Ambulation;RW;BSC   Toileting- Water quality scientist and Hygiene: Supervision/safety;Sit to/from stand   Tub/ Shower Transfer: Supervision/safety;Ambulation;Tub bench   Functional mobility during ADLs: Supervision/safety General ADL Comments: pt completed all adls this sessin and adequate levle for d/c home      Vision                     Perception     Praxis      Cognition   Behavior During Therapy: WFL for tasks assessed/performed Overall Cognitive Status: Within Functional Limits for  tasks assessed                       Extremity/Trunk Assessment               Exercises     Shoulder Instructions       General Comments      Pertinent Vitals/ Pain       Pain Assessment: 0-10 Pain Score: 6  Pain Location: Arm with RW Pain Intervention(s): Monitored during session  Home Living                                          Prior Functioning/Environment              Frequency Min 2X/week     Progress Toward Goals  OT Goals(current goals can now be found in the care plan section)  Progress towards OT goals: Progressing toward goals  Acute Rehab OT Goals Patient Stated Goal: not stated OT Goal Formulation: With patient Time For Goal Achievement: 06/25/14 Potential to Achieve Goals: Good ADL Goals Pt Will Perform Lower Body Dressing: with modified independence;with adaptive equipment;sit to/from stand Pt Will Transfer to Toilet: with modified independence;ambulating Pt Will Perform Tub/Shower  Transfer: Tub transfer;with supervision;ambulating;rolling walker  Plan Discharge plan remains appropriate    Co-evaluation                 End of Session Equipment Utilized During Treatment: Rolling walker   Activity Tolerance Patient tolerated treatment well   Patient Left in chair;with call bell/phone within reach   Nurse Communication Mobility status;Precautions        Time: 0630-1601 OT Time Calculation (min): 42 min  Charges: OT General Charges $OT Visit: 1 Procedure OT Treatments $Self Care/Home Management : 38-52 mins  Peri Maris 06/19/2014, 7:49 AM Pager: [phone]

## 2014-06-19 NOTE — Plan of Care (Signed)
Problem: Consults Goal: Diagnosis- Total Joint Replacement Outcome: Completed/Met Date Met:  06/19/14 Primary Total Hip left

## 2014-06-19 NOTE — Progress Notes (Signed)
PT Cancellation Note  Patient Details Name: BRONTE SABADO MRN: [id number] DOB: 03-16-1950   Cancelled Treatment:    Reason Eval/Treat Not Completed: Other (comment). Patient had just return to bed with nursing staff. Patient is working well with therapy and ambulating some with nursing staff. Requested to hold PT this afternoon due to increase in pain. Will follow up in AM>    Carlinda Ohlson, Tonia Brooms 06/19/2014, 2:29 PM

## 2014-06-20 LAB — CBC
HCT: 26.2 % — ABNORMAL LOW (ref 36.0–46.0)
Hemoglobin: 9.1 g/dL — ABNORMAL LOW (ref 12.0–15.0)
MCH: 34.1 pg — ABNORMAL HIGH (ref 26.0–34.0)
MCHC: 34.7 g/dL (ref 30.0–36.0)
MCV: 98.1 fL (ref 78.0–100.0)
Platelets: 166 10*3/uL (ref 150–400)
RBC: 2.67 MIL/uL — ABNORMAL LOW (ref 3.87–5.11)
RDW: 12.2 % (ref 11.5–15.5)
WBC: 7.6 10*3/uL (ref 4.0–10.5)

## 2014-06-20 NOTE — Progress Notes (Signed)
Subjective: 3 Days Post-Op Procedure(s) (LRB): LEFT TOTAL HIP ARTHROPLASTY ANTERIOR APPROACH (Left) Patient reports pain as moderate.  Feels good overall.  Cardiology saw patient yesterday for her episode of Afib; no changes to her home meds.  Vitals stable this am.  Asymptomatic acute blood loss anemia.  Objective: Vital signs in last 24 hours: Temp:  [97.8 F (36.6 C)-98.4 F (36.9 C)] 97.8 F (36.6 C) (04/08 0505) Pulse Rate:  [81-87] 85 (04/08 0505) Resp:  [16] 16 (04/08 0505) BP: (110-122)/(53-92) 110/53 mmHg (04/08 0505) SpO2:  [97 %-98 %] 98 % (04/08 0505)  Intake/Output from previous day: 04/07 0701 - 04/08 0700 In: 480 [P.O.:480] Out: -  Intake/Output this shift:     Recent Labs  06/18/14 0517 06/19/14 0638 06/20/14 0453  HGB 10.9* 9.8* 9.1*    Recent Labs  06/19/14 0638 06/20/14 0453  WBC 9.2 7.6  RBC 2.89* 2.67*  HCT 28.6* 26.2*  PLT 183 166    Recent Labs  06/18/14 0517  NA 135  K 4.3  CL 100  CO2 26  BUN 16  CREATININE 0.73  GLUCOSE 145*  CALCIUM 8.9   No results for input(s): LABPT, INR in the last 72 hours.  Sensation intact distally Intact pulses distally Dorsiflexion/Plantar flexion intact Incision: dressing C/D/I  Assessment/Plan: 3 Days Post-Op Procedure(s) (LRB): LEFT TOTAL HIP ARTHROPLASTY ANTERIOR APPROACH (Left) Up with therapy Discharge home with home health today.  Mcarthur Rossetti 06/20/2014, 6:57 AM

## 2014-06-20 NOTE — Progress Notes (Signed)
Physical Therapy Treatment Patient Details Name: [name] MRN: [id number] DOB: 24-Apr-1950 Today's Date: 06/20/2014    History of Present Illness Patient is a 64 y/o female s/p L THA. PMH of HTN, A-fib and skin ca.    PT Comments    Pt is progressing toward goals and increasing ambulation distance. Pt safe to D/C from a mobility standpoint based on progression toward goals set on PT eval.   Follow Up Recommendations  Home health PT;Supervision/Assistance - 24 hour     Equipment Recommendations  Rolling walker with 5" wheels;3in1 (PT)    Recommendations for Other Services OT consult     Precautions / Restrictions Precautions Precautions: Fall Precaution Comments: Direct anterior approach. Restrictions LLE Weight Bearing: Weight bearing as tolerated    Mobility  Bed Mobility Overal bed mobility: Modified Independent                Transfers Overall transfer level: Modified independent Equipment used: Rolling walker (2 wheeled)   Sit to Stand: Modified independent (Device/Increase time)         General transfer comment: Pt able to complete with safe technique.  Ambulation/Gait Ambulation/Gait assistance: Modified independent (Device/Increase time) Ambulation Distance (Feet): 270 Feet Assistive device: Rolling walker (2 wheeled) Gait Pattern/deviations: Step-through pattern;Trunk flexed   Gait velocity interpretation: Below normal speed for age/gender General Gait Details: Cues for upright posture and keeping RW moving.   Stairs            Wheelchair Mobility    Modified Rankin (Stroke Patients Only)       Balance                                    Cognition Arousal/Alertness: Awake/alert Behavior During Therapy: WFL for tasks assessed/performed Overall Cognitive Status: Within Functional Limits for tasks assessed                      Exercises Total Joint Exercises Quad Sets: AROM;Both;10 reps Heel Slides:  AROM;Left;10 reps Hip ABduction/ADduction: AROM;Left;10 reps;Standing Long Arc Quad: AROM;Left;10 reps Other Exercises Other Exercises: Mini squats x10    General Comments        Pertinent Vitals/Pain Pain Assessment: 0-10 Pain Score: 2  Pain Location: L hip Pain Descriptors / Indicators: Sore Pain Intervention(s): Monitored during session    Home Living                      Prior Function            PT Goals (current goals can now be found in the care plan section) Progress towards PT goals: Progressing toward goals    Frequency  7X/week    PT Plan Current plan remains appropriate    Co-evaluation             End of Session   Activity Tolerance: Patient tolerated treatment well Patient left: in chair;with call bell/phone within reach     Time: 8366-2947 PT Time Calculation (min) (ACUTE ONLY): 26 min  Charges:                       G CodesRubye Oaks, SPTA 06/20/2014, 8:53 AM

## 2014-06-20 NOTE — Discharge Summary (Signed)
Patient ID: NELMA PHAGAN MRN: [id number] DOB/AGE: 65-16-1952 65 y.o.  Admit date: 06/17/2014 Discharge date: 06/20/2014  Admission Diagnoses:  Principal Problem:   Osteoarthritis of left hip Active Problems:   Status post total replacement of left hip   Discharge Diagnoses:  Same  Past Medical History  Diagnosis Date  . Hypertension   . Swelling of both lower extremities   . Atrial fibrillation   . GERD (gastroesophageal reflux disease)   . Melanoma   . Skin cancer   . Dysrhythmia     dr Debara Pickett  . Scoliosis   . Difficult intubation     pt states that her neck needs to be in hyperextion, scoliosis    Surgeries: Procedure(s): LEFT TOTAL HIP ARTHROPLASTY ANTERIOR APPROACH on 06/17/2014   Consultants:    Discharged Condition: Improved  Hospital Course: KHRISTA BRAUN is an 64 y.o. female who was admitted 06/17/2014 for operative treatment ofOsteoarthritis of left hip. Patient has severe unremitting pain that affects sleep, daily activities, and work/hobbies. After pre-op clearance the patient was taken to the operating room on 06/17/2014 and underwent  Procedure(s): LEFT TOTAL HIP ARTHROPLASTY ANTERIOR APPROACH.    Patient was given perioperative antibiotics: Anti-infectives    Start     Dose/Rate Route Frequency Ordered Stop   06/17/14 1900  ceFAZolin (ANCEF) IVPB 1 g/50 mL premix     1 g 100 mL/hr over 30 Minutes Intravenous Every 6 hours 06/17/14 1737 06/18/14 0430   06/17/14 0600  ceFAZolin (ANCEF) IVPB 2 g/50 mL premix     2 g 100 mL/hr over 30 Minutes Intravenous On call to O.R. 06/16/14 1449 06/17/14 1245       Patient was given sequential compression devices, early ambulation, and chemoprophylaxis to prevent DVT.  Patient benefited maximally from hospital stay and there were no complications.    Recent vital signs: Patient Vitals for the past 24 hrs:  BP Temp Pulse Resp SpO2  06/20/14 0505 (!) 110/53 mmHg 97.8 F (36.6 C) 85 16 98 %  06/20/14 0400 - - - 16 98 %   06/20/14 0000 - - - 16 97 %  06/19/14 2000 - - - 16 97 %  06/19/14 1938 (!) 122/92 mmHg 98.4 F (36.9 C) 85 16 97 %  06/19/14 1410 (!) 115/57 mmHg 98.3 F (36.8 C) 87 16 98 %  06/19/14 1103 (!) 116/58 mmHg - 81 - -     Recent laboratory studies:  Recent Labs  06/18/14 0517 06/19/14 0638 06/20/14 0453  WBC 15.2* 9.2 7.6  HGB 10.9* 9.8* 9.1*  HCT 32.6* 28.6* 26.2*  PLT 226 183 166  NA 135  --   --   K 4.3  --   --   CL 100  --   --   CO2 26  --   --   BUN 16  --   --   CREATININE 0.73  --   --   GLUCOSE 145*  --   --   CALCIUM 8.9  --   --      Discharge Medications:     Medication List    STOP taking these medications        aspirin 325 MG tablet  Replaced by:  aspirin 325 MG EC tablet     ibuprofen 600 MG tablet  Commonly known as:  ADVIL,MOTRIN      TAKE these medications        aspirin 325 MG EC tablet  Take 1 tablet (325  mg total) by mouth 2 (two) times daily after a meal.     diltiazem 240 MG 24 hr capsule  Commonly known as:  TIAZAC  Take 1 capsule (240 mg total) by mouth daily.     flecainide 50 MG tablet  Commonly known as:  TAMBOCOR  Take 1 tablet (50 mg total) by mouth 2 (two) times daily.     gabapentin 600 MG tablet  Commonly known as:  NEURONTIN  Take 1 tablet (600 mg total) by mouth 3 (three) times daily.     oxyCODONE-acetaminophen 5-325 MG per tablet  Commonly known as:  ROXICET  Take 1-2 tablets by mouth every 4 (four) hours as needed.     pantoprazole 40 MG tablet  Commonly known as:  PROTONIX  Take 40 mg by mouth daily.     senna 8.6 MG tablet  Commonly known as:  SENOKOT  Take 1 tablet by mouth daily.     spironolactone 50 MG tablet  Commonly known as:  ALDACTONE  Take 50 mg by mouth 2 (two) times daily.     tiZANidine 4 MG tablet  Commonly known as:  ZANAFLEX  Take 1 tablet (4 mg total) by mouth every 6 (six) hours as needed.     traMADol 50 MG tablet  Commonly known as:  ULTRAM  Take 1 tablet (50 mg total) by  mouth 3 (three) times daily as needed (Take 1 tablet every 8 hours as needed for pain).     triamcinolone cream 0.1 %  Commonly known as:  KENALOG  Apply 1 application topically as needed (for dry skin).        Diagnostic Studies: Dg Hip Port Unilat With Pelvis 1v Left  06/17/2014   CLINICAL DATA:  Status post left hip replacement  EXAM: LEFT HIP (WITH PELVIS) 1 VIEW PORTABLE  COMPARISON:  None.  FINDINGS: Left hip replacement is noted. No acute bony abnormality is seen. Air is noted within the surgical bed. Degenerative changes of the right hip are noted.  IMPRESSION: Status post left hip replacement.  No acute abnormality is noted.   Electronically Signed   By: Inez Catalina M.D.   On: 06/17/2014 16:23   Dg Hip Operative Unilat With Pelvis Left  06/17/2014   CLINICAL DATA:  Left hip replacement  EXAM: OPERATIVE left HIP (WITH PELVIS IF PERFORMED) 2 VIEWS  TECHNIQUE: Fluoroscopic spot image(s) were submitted for interpretation post-operatively.  COMPARISON:  None.  FINDINGS: 37 seconds of fluoroscopy was utilized. A left hip prosthesis is seen. No acute abnormality is noted.  IMPRESSION: Status post left hip replacement.   Electronically Signed   By: Inez Catalina M.D.   On: 06/17/2014 14:45    Disposition:  To home      Discharge Instructions    Discharge patient    Complete by:  As directed            Follow-up Information    Follow up with Mcarthur Rossetti, MD In 2 weeks.   Specialty:  Orthopedic Surgery   Contact information:   [place] [address] [phone]        Signed: Mcarthur Rossetti 06/20/2014, 6:59 AM

## 2014-06-21 DIAGNOSIS — Z96642 Presence of left artificial hip joint: Secondary | ICD-10-CM | POA: Diagnosis not present

## 2014-06-21 DIAGNOSIS — Z471 Aftercare following joint replacement surgery: Secondary | ICD-10-CM | POA: Diagnosis not present

## 2014-06-21 DIAGNOSIS — M1611 Unilateral primary osteoarthritis, right hip: Secondary | ICD-10-CM | POA: Diagnosis not present

## 2014-06-21 DIAGNOSIS — M419 Scoliosis, unspecified: Secondary | ICD-10-CM | POA: Diagnosis not present

## 2014-06-21 DIAGNOSIS — M47897 Other spondylosis, lumbosacral region: Secondary | ICD-10-CM | POA: Diagnosis not present

## 2014-06-21 DIAGNOSIS — I10 Essential (primary) hypertension: Secondary | ICD-10-CM | POA: Diagnosis not present

## 2014-06-23 DIAGNOSIS — Z96642 Presence of left artificial hip joint: Secondary | ICD-10-CM | POA: Diagnosis not present

## 2014-06-23 DIAGNOSIS — M1611 Unilateral primary osteoarthritis, right hip: Secondary | ICD-10-CM | POA: Diagnosis not present

## 2014-06-23 DIAGNOSIS — I10 Essential (primary) hypertension: Secondary | ICD-10-CM | POA: Diagnosis not present

## 2014-06-23 DIAGNOSIS — Z471 Aftercare following joint replacement surgery: Secondary | ICD-10-CM | POA: Diagnosis not present

## 2014-06-23 DIAGNOSIS — M419 Scoliosis, unspecified: Secondary | ICD-10-CM | POA: Diagnosis not present

## 2014-06-23 DIAGNOSIS — M47897 Other spondylosis, lumbosacral region: Secondary | ICD-10-CM | POA: Diagnosis not present

## 2014-06-25 DIAGNOSIS — Z96642 Presence of left artificial hip joint: Secondary | ICD-10-CM | POA: Diagnosis not present

## 2014-06-25 DIAGNOSIS — M419 Scoliosis, unspecified: Secondary | ICD-10-CM | POA: Diagnosis not present

## 2014-06-25 DIAGNOSIS — I10 Essential (primary) hypertension: Secondary | ICD-10-CM | POA: Diagnosis not present

## 2014-06-25 DIAGNOSIS — M1611 Unilateral primary osteoarthritis, right hip: Secondary | ICD-10-CM | POA: Diagnosis not present

## 2014-06-25 DIAGNOSIS — Z471 Aftercare following joint replacement surgery: Secondary | ICD-10-CM | POA: Diagnosis not present

## 2014-06-25 DIAGNOSIS — M47897 Other spondylosis, lumbosacral region: Secondary | ICD-10-CM | POA: Diagnosis not present

## 2014-06-27 DIAGNOSIS — M1611 Unilateral primary osteoarthritis, right hip: Secondary | ICD-10-CM | POA: Diagnosis not present

## 2014-06-27 DIAGNOSIS — Z96642 Presence of left artificial hip joint: Secondary | ICD-10-CM | POA: Diagnosis not present

## 2014-06-27 DIAGNOSIS — I10 Essential (primary) hypertension: Secondary | ICD-10-CM | POA: Diagnosis not present

## 2014-06-27 DIAGNOSIS — M47897 Other spondylosis, lumbosacral region: Secondary | ICD-10-CM | POA: Diagnosis not present

## 2014-06-27 DIAGNOSIS — M419 Scoliosis, unspecified: Secondary | ICD-10-CM | POA: Diagnosis not present

## 2014-06-27 DIAGNOSIS — Z471 Aftercare following joint replacement surgery: Secondary | ICD-10-CM | POA: Diagnosis not present

## 2014-06-30 DIAGNOSIS — M47897 Other spondylosis, lumbosacral region: Secondary | ICD-10-CM | POA: Diagnosis not present

## 2014-06-30 DIAGNOSIS — Z96642 Presence of left artificial hip joint: Secondary | ICD-10-CM | POA: Diagnosis not present

## 2014-06-30 DIAGNOSIS — M419 Scoliosis, unspecified: Secondary | ICD-10-CM | POA: Diagnosis not present

## 2014-06-30 DIAGNOSIS — I10 Essential (primary) hypertension: Secondary | ICD-10-CM | POA: Diagnosis not present

## 2014-06-30 DIAGNOSIS — Z471 Aftercare following joint replacement surgery: Secondary | ICD-10-CM | POA: Diagnosis not present

## 2014-06-30 DIAGNOSIS — M1611 Unilateral primary osteoarthritis, right hip: Secondary | ICD-10-CM | POA: Diagnosis not present

## 2014-07-01 DIAGNOSIS — M1612 Unilateral primary osteoarthritis, left hip: Secondary | ICD-10-CM | POA: Diagnosis not present

## 2014-07-02 DIAGNOSIS — R609 Edema, unspecified: Secondary | ICD-10-CM | POA: Diagnosis not present

## 2014-07-02 DIAGNOSIS — Z96642 Presence of left artificial hip joint: Secondary | ICD-10-CM | POA: Diagnosis not present

## 2014-07-02 DIAGNOSIS — M1611 Unilateral primary osteoarthritis, right hip: Secondary | ICD-10-CM | POA: Diagnosis not present

## 2014-07-02 DIAGNOSIS — I10 Essential (primary) hypertension: Secondary | ICD-10-CM | POA: Diagnosis not present

## 2014-07-02 DIAGNOSIS — Z471 Aftercare following joint replacement surgery: Secondary | ICD-10-CM | POA: Diagnosis not present

## 2014-07-02 DIAGNOSIS — M47897 Other spondylosis, lumbosacral region: Secondary | ICD-10-CM | POA: Diagnosis not present

## 2014-07-02 DIAGNOSIS — M419 Scoliosis, unspecified: Secondary | ICD-10-CM | POA: Diagnosis not present

## 2014-07-02 DIAGNOSIS — G479 Sleep disorder, unspecified: Secondary | ICD-10-CM | POA: Diagnosis not present

## 2014-07-03 DIAGNOSIS — Z96642 Presence of left artificial hip joint: Secondary | ICD-10-CM | POA: Diagnosis not present

## 2014-07-03 DIAGNOSIS — M47897 Other spondylosis, lumbosacral region: Secondary | ICD-10-CM | POA: Diagnosis not present

## 2014-07-03 DIAGNOSIS — M1611 Unilateral primary osteoarthritis, right hip: Secondary | ICD-10-CM | POA: Diagnosis not present

## 2014-07-03 DIAGNOSIS — Z471 Aftercare following joint replacement surgery: Secondary | ICD-10-CM | POA: Diagnosis not present

## 2014-07-03 DIAGNOSIS — M419 Scoliosis, unspecified: Secondary | ICD-10-CM | POA: Diagnosis not present

## 2014-07-03 DIAGNOSIS — I10 Essential (primary) hypertension: Secondary | ICD-10-CM | POA: Diagnosis not present

## 2014-07-08 ENCOUNTER — Encounter: Payer: Self-pay | Admitting: Physical Medicine & Rehabilitation

## 2014-07-08 ENCOUNTER — Encounter: Payer: Medicare Other | Attending: Physical Medicine & Rehabilitation

## 2014-07-08 ENCOUNTER — Other Ambulatory Visit: Payer: Self-pay | Admitting: Surgery

## 2014-07-08 ENCOUNTER — Ambulatory Visit (HOSPITAL_BASED_OUTPATIENT_CLINIC_OR_DEPARTMENT_OTHER): Payer: Medicare Other | Admitting: Physical Medicine & Rehabilitation

## 2014-07-08 VITALS — BP 124/60 | HR 76 | Resp 14

## 2014-07-08 DIAGNOSIS — M24552 Contracture, left hip: Secondary | ICD-10-CM | POA: Diagnosis not present

## 2014-07-08 DIAGNOSIS — G894 Chronic pain syndrome: Secondary | ICD-10-CM | POA: Diagnosis not present

## 2014-07-08 DIAGNOSIS — E041 Nontoxic single thyroid nodule: Secondary | ICD-10-CM

## 2014-07-08 DIAGNOSIS — Z5181 Encounter for therapeutic drug level monitoring: Secondary | ICD-10-CM | POA: Diagnosis not present

## 2014-07-08 DIAGNOSIS — M24551 Contracture, right hip: Secondary | ICD-10-CM

## 2014-07-08 DIAGNOSIS — M47817 Spondylosis without myelopathy or radiculopathy, lumbosacral region: Secondary | ICD-10-CM | POA: Diagnosis not present

## 2014-07-08 DIAGNOSIS — M1611 Unilateral primary osteoarthritis, right hip: Secondary | ICD-10-CM | POA: Diagnosis not present

## 2014-07-08 DIAGNOSIS — E042 Nontoxic multinodular goiter: Secondary | ICD-10-CM | POA: Diagnosis not present

## 2014-07-08 DIAGNOSIS — Z79899 Other long term (current) drug therapy: Secondary | ICD-10-CM | POA: Insufficient documentation

## 2014-07-08 DIAGNOSIS — M160 Bilateral primary osteoarthritis of hip: Secondary | ICD-10-CM | POA: Insufficient documentation

## 2014-07-08 NOTE — Patient Instructions (Signed)
Please call if you wish to cancel the hip injection

## 2014-07-08 NOTE — Progress Notes (Signed)
Subjective:    Patient ID: [name], female    DOB: [birth year], 64 y.o.   MRN: [id number]  HPI Occ tramadol for hip pain back is doing well, driving Left hip is Is doing great Right hip is the main issue right now, her daughter is having twins in September and she would like to be fully functional for that. She met with her orthopedic surgeon Dr. Rush Farmer who indicated that she can certainly wait to have the right hip replaced. He suggested perhaps injections as well as therapy. Pain Inventory Average Pain 2 Pain Right Now 3 My pain is intermittent, dull, tingling and aching  In the last 24 hours, has pain interfered with the following? General activity 5 Relation with others 0 Enjoyment of life 3 What TIME of day is your pain at its worst? evening Sleep (in general) Poor  Pain is worse with: walking Pain improves with: rest, heat/ice and therapy/exercise Relief from Meds: 0  Mobility walk without assistance use a cane how many minutes can you walk? 20 ability to climb steps?  yes do you drive?  yes  Function retired  Neuro/Psych bladder control problems weakness  Prior Studies Any changes since last visit?  no  Physicians involved in your care Any changes since last visit?  no   Family History  Problem Relation Age of Onset  . Uterine cancer Mother   . Diabetes Brother 35  . Hypertension Father 59  . Hyperlipidemia Father   . Cancer Father 41  . Epilepsy Father 40  . Arthritis Sister 79  . Cancer Maternal Grandmother   . Stroke Paternal Grandfather   . Arthritis Sister    History   Social History  . Marital Status: Divorced    Spouse Name: N/A  . Number of Children: 2  . Years of Education: JD   Occupational History  . lawyer    Social History Main Topics  . Smoking status: Never Smoker   . Smokeless tobacco: Never Used  . Alcohol Use: 0.0 oz/week    0 Standard drinks or equivalent per week     Comment: occ  . Drug Use: No  .  Sexual Activity: Not on file   Other Topics Concern  . None   Social History Narrative   Past Surgical History  Procedure Laterality Date  . Abdominal hysterectomy  09/10/2013  . Melanoma excision  12/2011  . Vein surgery  05/2011    venous ablation   . Bunionectomy  10/1976  . Fracture surgery  09/1964  . Total hip arthroplasty Left 06/17/2014    Procedure: LEFT TOTAL HIP ARTHROPLASTY ANTERIOR APPROACH;  Surgeon: Mcarthur Rossetti, MD;  Location: [facility];  Service: Orthopedics;  Laterality: Left;   Past Medical History  Diagnosis Date  . Hypertension   . Swelling of both lower extremities   . Atrial fibrillation   . GERD (gastroesophageal reflux disease)   . Melanoma   . Skin cancer   . Dysrhythmia     dr Debara Pickett  . Scoliosis   . Difficult intubation     pt states that her neck needs to be in hyperextion, scoliosis   BP 124/60 mmHg  Pulse 76  Resp 14  SpO2 100%  Opioid Risk Score:   Fall Risk Score:  `1  Depression screen PHQ 2/9  Depression screen PHQ 2/9 06/03/2014  Decreased Interest 1  Down, Depressed, Hopeless 0  PHQ - 2 Score 1  Altered sleeping 3  Tired, decreased  energy 0  Change in appetite 0  Feeling bad or failure about yourself  0  Trouble concentrating 0  Moving slowly or fidgety/restless 0  Suicidal thoughts 0  PHQ-9 Score 4     Review of Systems  HENT: Negative.   Eyes: Negative.   Respiratory: Negative.   Cardiovascular: Negative.   Gastrointestinal: Negative.   Endocrine: Negative.   Genitourinary:       Bladder control problems  Musculoskeletal: Positive for back pain, joint swelling and arthralgias.       Right hip flaring up  Skin: Negative.   Allergic/Immunologic: Negative.   Neurological: Positive for weakness.       Some weakness  Hematological: Negative.   Psychiatric/Behavioral: Negative.        Objective:   Physical Exam  Constitutional: She is oriented to person, place, and time. She appears well-developed and  well-nourished.  Musculoskeletal:       Right hip: She exhibits decreased range of motion and deformity.  Back has no tenderness to palpation \ Negative straight leg raising test  Neurological: She is alert and oriented to person, place, and time.  Psychiatric: She has a normal mood and affect.  Nursing note and vitals reviewed.  Right hip 10 flexion contracture. Decreased internal and external rotation on examination Left hip has 0-4 hip flexion contracture, measurement somewhat difficult due to  body habitus         Assessment & Plan:  1. Right hip osteoarthritis end-stage with contracture. As discussed with the patient I do not think a hip injection would cause a full resolution of her hip contracture on the right side. It may however enable her to work with physical therapy on strengthening and range of motion. I do not think a hip injection would eliminate the need for a hip replacement on that side but can buy her some time.  She is up against a timeframe of 5 months. She would like to discuss this with her orthopedic surgeon when she sees him in mid May. We'll tentatively put her down for right hip intra-articular injection under fluoroscopic guidance This can be followed with some outpatient physical therapy

## 2014-07-09 ENCOUNTER — Other Ambulatory Visit: Payer: Self-pay | Admitting: Surgery

## 2014-07-09 DIAGNOSIS — E041 Nontoxic single thyroid nodule: Secondary | ICD-10-CM

## 2014-07-11 ENCOUNTER — Ambulatory Visit
Admission: RE | Admit: 2014-07-11 | Discharge: 2014-07-11 | Disposition: A | Discharge status: Home / Self Care | Payer: Medicare Other | Source: Ambulatory Visit | Attending: Surgery | Admitting: Surgery

## 2014-07-11 DIAGNOSIS — E042 Nontoxic multinodular goiter: Secondary | ICD-10-CM | POA: Diagnosis not present

## 2014-07-14 ENCOUNTER — Ambulatory Visit: Payer: Medicare Other | Attending: Orthopaedic Surgery

## 2014-07-14 DIAGNOSIS — Z96641 Presence of right artificial hip joint: Secondary | ICD-10-CM | POA: Diagnosis not present

## 2014-07-14 DIAGNOSIS — M25659 Stiffness of unspecified hip, not elsewhere classified: Secondary | ICD-10-CM | POA: Insufficient documentation

## 2014-07-14 DIAGNOSIS — M419 Scoliosis, unspecified: Secondary | ICD-10-CM | POA: Diagnosis not present

## 2014-07-14 DIAGNOSIS — M545 Low back pain, unspecified: Secondary | ICD-10-CM

## 2014-07-14 DIAGNOSIS — R6889 Other general symptoms and signs: Secondary | ICD-10-CM | POA: Diagnosis not present

## 2014-07-14 NOTE — Therapy (Addendum)
[facility] [address] [place], [place], [address] Phone: [phone]   Fax:  [phone]  Physical Therapy Evaluation  Patient Details  Name: [name] MRN: [id number] Date of Birth: Oct 10, 1950 Referring Provider:  Mcarthur Rossetti*  Encounter Date: 07/14/2014      PT End of Session - 07/14/14 1006    Visit Number 1   Number of Visits 10  Medicare   Date for PT Re-Evaluation 08/25/14   PT Start Time 0931   PT Stop Time 1007   PT Time Calculation (min) 36 min   Activity Tolerance Patient tolerated treatment well   Behavior During Therapy [facility] for tasks assessed/performed      Past Medical History  Diagnosis Date  . Hypertension   . Swelling of both lower extremities   . Atrial fibrillation   . GERD (gastroesophageal reflux disease)   . Melanoma   . Skin cancer   . Dysrhythmia     dr Debara Pickett  . Scoliosis   . Difficult intubation     pt states that her neck needs to be in hyperextion, scoliosis    Past Surgical History  Procedure Laterality Date  . Abdominal hysterectomy  09/10/2013  . Melanoma excision  12/2011  . Vein surgery  05/2011    venous ablation   . Bunionectomy  10/1976  . Fracture surgery  09/1964  . Total hip arthroplasty Left 06/17/2014    Procedure: LEFT TOTAL HIP ARTHROPLASTY ANTERIOR APPROACH;  Surgeon: Mcarthur Rossetti, MD;  Location: [facility];  Service: Orthopedics;  Laterality: Left;    There were no vitals filed for this visit.  Visit Diagnosis:  Low back pain associated with a spinal disorder other than radiculopathy or spinal stenosis - Plan: PT plan of care cert/re-cert  Hip stiffness, unspecified laterality - Plan: PT plan of care cert/re-cert  Activity intolerance - Plan: PT plan of care cert/re-cert      Subjective Assessment - 07/14/14 0937    Subjective Pt presents to PT with continued chronic LBP.  Pt had Lt total hip replacement 06/18/14 and is able to  stand up straighter and walk longer periods.  Rt hip has started to hurt and will have injection soon.    Pertinent History Lt lumbar ablasion, Lt total hip replacement   Limitations Standing;Walking   How long can you stand comfortably? 10-15 minutes   How long can you walk comfortably? 20 minutes   Patient Stated Goals stand up straighter and reduce low back pain with standing   Currently in Pain? Yes   Pain Score 4    Pain Location Back   Pain Orientation Right;Left;Lower   Pain Descriptors / Indicators Aching;Tightness;Dull   Pain Type Chronic pain   Pain Onset More than a month ago   Pain Frequency Intermittent   Aggravating Factors  standing and walking, standing upright   Pain Relieving Factors medication, rest, heat   Multiple Pain Sites No   Pain Score 6  2-6/10   Pain Location Hip   Pain Orientation Right   Pain Descriptors / Indicators Sharp   Pain Type Acute pain   Pain Frequency Intermittent            [facility] PT Assessment - 07/14/14 0001    Assessment   Medical Diagnosis low back pain degenerative scoliosis, s/p Lt anterior hip replacement on 06/17/14   Onset Date 06/17/14  surgery, LBP is chronic   Next MD Visit 07/31/14  Prior Therapy at this clinic for low back pain   Precautions   Precautions None   Restrictions   Weight Bearing Restrictions No   Balance Screen   Has the patient fallen in the past 6 months No   Has the patient had a decrease in activity level because of a fear of falling?  No   Is the patient reluctant to leave their home because of a fear of falling?  No   Home Environment   Living Enviornment Private residence   Home Access Level entry   Home Layout One level   Prior Function   Level of Independence Independent with basic ADLs   Vocation Retired   Associate Professor   Overall Cognitive Status Within Functional Limits for tasks assessed   Observation/Other Assessments   Focus on Therapeutic Outcomes (FOTO)  50% limitation    Posture/Postural Control   Posture/Postural Control Postural limitations   Postural Limitations Flexed trunk;Decreased lumbar lordosis   ROM / Strength   AROM / PROM / Strength AROM;PROM;Strength   AROM   Overall AROM  Within functional limits for tasks performed   Overall AROM Comments Lumbar AROM is full with stiffness reported at end range of each motion   AROM Assessment Site Lumbar   PROM   Overall PROM  Deficits   Overall PROM Comments Lt hip PROM is WFLs, and Lt hip PROM limited by 25-40% vs the Rt.     Strength   Overall Strength Within functional limits for tasks performed   Overall Strength Comments 4+/5 bilateral LE strength   Palpation   Palpation Rt hip flexor is tender to palpation with muscle tension.  Pt with mild tenderness to palpation over lumbar paraspinals   Ambulation/Gait   Ambulation/Gait Yes   Ambulation Distance (Feet) 100 Feet   Gait Pattern Step-through pattern;Decreased step length - right;Trunk flexed                           PT Education - 07/14/14 1003    Education provided Yes   Education Details HEP: hip flexor stretch, stnading hip 4 ways with focus on abdominal bracing and posture   Person(s) Educated Patient   Methods Handout;Demonstration;Explanation   Comprehension Verbalized understanding;Returned demonstration          PT Short Term Goals - 07/14/14 1009    PT SHORT TERM GOAL #1   Title be independent in initial HEP   Time 3   Period Weeks   Status New   PT SHORT TERM GOAL #2   Title stand for housework and kitchen activity for 15 minutes with upright posture and no limitaition   Time 3   Period Weeks   Status New   PT SHORT TERM GOAL #3   Title report a 20% reduction in LBP with standing and walking   Time 3   Period Weeks   Status New           PT Long Term Goals - 07/14/14 7846    PT LONG TERM GOAL #1   Title be independent in advanced HEP   Time 6   Period Weeks   Status New   PT LONG TERM  GOAL #2   Title reduce FOTO to < or = to 42% limitation   Time 6   Period Weeks   PT LONG TERM GOAL #3   Title stand for 20 minutes for home tasks with upright posture   Time 6  Period Weeks   Status New   PT LONG TERM GOAL #4   Title report a 40% reduction in LBP with standing acitivity   Time 6   Period Weeks   Status New   PT LONG TERM GOAL #5   Title ---               Plan - 07/25/14 1007    Clinical Impression Statement Pt presents with chronic LBP that has improved since Rt total hip replacement but is worse with standing and walking.  Pt demonstrates forward trunk flexion and limited Rt hip flexibility. Pt will benefit from hip and lumbar flexibility and focus on core strength and posture in standing.     Pt will benefit from skilled therapeutic intervention in order to improve on the following deficits Impaired flexibility;Pain;Improper body mechanics;Decreased mobility;Decreased activity tolerance;Decreased endurance;Decreased strength;Difficulty walking;Abnormal gait   Rehab Potential Good   PT Frequency 2x / week   PT Duration 6 weeks   PT Treatment/Interventions ADLs/Self Care Home Management;Therapeutic activities;Therapeutic exercise;Passive range of motion;Gait training;Manual techniques;Neuromuscular re-education;Electrical Stimulation;Cryotherapy   PT Next Visit Plan Rt hip flexibility, core strength and focus on core stability in standing.     Consulted and Agree with Plan of Care Patient          G-Codes - 2014-07-25 6812    Functional Assessment Tool Used FOTO: 50% limitation   Functional Limitation Mobility: Walking and moving around   Mobility: Walking and Moving Around Current Status [phone]) At least 40 percent but less than 60 percent impaired, limited or restricted   Mobility: Walking and Moving Around Goal Status [phone]) At least 40 percent but less than 60 percent impaired, limited or restricted       Problem List Patient Active Problem List    Diagnosis Date Noted  . Contracture of right hip 07/08/2014  . Osteoarthritis of left hip 06/17/2014  . Status post total replacement of left hip 06/17/2014  . Contracture of left hip 06/03/2014  . Preoperative cardiovascular examination 04/22/2014  . PAF (paroxysmal atrial fibrillation) 04/22/2014  . Long term current use of antiarrhythmic drug 04/22/2014  . History of uterine cancer 04/22/2014  . Osteoarthritis, hip, bilateral 03/25/2014  . Lumbar and sacral osteoarthritis 12/10/2013    Mervyn Pflaum, PT 07-25-2014, 3:49 PM  [facility] [address], [place], [place], [address] Phone: [phone]   Fax:  [phone]

## 2014-07-14 NOTE — Patient Instructions (Signed)
Hip Flexor Stretch   Lying on back near edge of bed, bend one leg, foot flat. Hang other leg over edge, relaxed, thigh resting entirely on bed for 20 seconds.  Repeat __3__ times. Do __3__ sessions per day. Advanced Exercise: Bend knee back keeping thigh in contact with bed.  TIGHTEN YOUR ABDOMINALS  Knee High   Holding stable object, raise knee to hip level, then lower knee. Repeat with other knee. Complete __10_ repetitions. Do __2__ sessions per day.  ABDUCTION: Standing (Active)   Stand, feet flat. Lift right leg out to side. Use _0__ lbs. Complete __10_ repetitions. Perform __2_ sessions per day.  ADDUCTION: Standing - Stable (Active)   Stand, right leg out to side as far as possible. Draw leg in across midline. Use _0__ lbs. Complete 10_ repetitions. Perform _2__ sessions per day.       EXTENSION: Standing (Active)  Stand, both feet flat. Draw right leg behind body as far as possible. Use 0___ lbs. Complete 10 repetitions. Perform __2_ sessions per day.  Copyright  VHI. All rights reserved.

## 2014-07-21 ENCOUNTER — Encounter: Payer: Self-pay | Admitting: Physical Therapy

## 2014-07-21 ENCOUNTER — Ambulatory Visit: Payer: Medicare Other | Admitting: Physical Therapy

## 2014-07-21 DIAGNOSIS — R6889 Other general symptoms and signs: Secondary | ICD-10-CM | POA: Diagnosis not present

## 2014-07-21 DIAGNOSIS — M419 Scoliosis, unspecified: Secondary | ICD-10-CM | POA: Diagnosis not present

## 2014-07-21 DIAGNOSIS — M545 Low back pain, unspecified: Secondary | ICD-10-CM

## 2014-07-21 DIAGNOSIS — Z96641 Presence of right artificial hip joint: Secondary | ICD-10-CM | POA: Diagnosis not present

## 2014-07-21 DIAGNOSIS — M25659 Stiffness of unspecified hip, not elsewhere classified: Secondary | ICD-10-CM

## 2014-07-21 DIAGNOSIS — M25559 Pain in unspecified hip: Secondary | ICD-10-CM

## 2014-07-21 NOTE — Therapy (Signed)
[facility] [address], [place], [place], [address] Phone: [phone]   Fax:  [phone]  Physical Therapy Treatment  Patient Details  Name: [name] MRN: [id number] Date of Birth: [birth year] Referring Provider:  Mcarthur Rossetti*  Encounter Date: 07/21/2014      PT End of Session - 07/21/14 1109    Visit Number 2   Number of Visits 10  Medicare   Date for PT Re-Evaluation 08/25/14   PT Start Time 1100   PT Stop Time 1145   PT Time Calculation (min) 45 min   Activity Tolerance Patient tolerated treatment well   Behavior During Therapy [facility] for tasks assessed/performed      Past Medical History  Diagnosis Date  . Hypertension   . Swelling of both lower extremities   . Atrial fibrillation   . GERD (gastroesophageal reflux disease)   . Melanoma   . Skin cancer   . Dysrhythmia     dr Debara Pickett  . Scoliosis   . Difficult intubation     pt states that her neck needs to be in hyperextion, scoliosis    Past Surgical History  Procedure Laterality Date  . Abdominal hysterectomy  09/10/2013  . Melanoma excision  12/2011  . Vein surgery  05/2011    venous ablation   . Bunionectomy  10/1976  . Fracture surgery  09/1964  . Total hip arthroplasty Left 06/17/2014    Procedure: LEFT TOTAL HIP ARTHROPLASTY ANTERIOR APPROACH;  Surgeon: Mcarthur Rossetti, MD;  Location: [facility];  Service: Orthopedics;  Laterality: Left;    There were no vitals filed for this visit.  Visit Diagnosis:  Hip stiffness, unspecified laterality  Activity intolerance  Hip pain, unspecified laterality  Low back pain associated with a spinal disorder other than radiculopathy or spinal stenosis      Subjective Assessment - 07/21/14 1107    Subjective I am fine when I am moving. I feel like I may need my right hip done. My next visit with Dr. Ninfa Linden is in 07/31/2014.    Pertinent History Lt lumbar ablasion, Lt total hip  replacement   Limitations Standing;Walking   How long can you stand comfortably? 10-15 minutes   How long can you walk comfortably? 20 minutes   Patient Stated Goals stand up straighter and reduce low back pain with standing   Currently in Pain? Yes   Pain Score 1    Pain Location Back   Pain Orientation Lower   Pain Descriptors / Indicators Aching;Tightness;Dull   Pain Type Chronic pain   Pain Onset More than a month ago   Pain Frequency Intermittent   Aggravating Factors  standing    Pain Relieving Factors medication, rest, movement   Effect of Pain on Daily Activities limited standing   Multiple Pain Sites No                         [facility] Adult PT Treatment/Exercise - 07/21/14 0001    Lumbar Exercises: Aerobic   Stationary Bike level 3 x 12 min   Lumbar Exercises: Standing   Other Standing Lumbar Exercises vestibular board: rock back and forth with lower abdominal contraction, side to side movement  1 min each   Lumbar Exercises: Supine   Other Supine Lumbar Exercises Hookly rock knee in/out with lower abdominal contraction and breathing 15 times bil.    Knee/Hip Exercises: Stretches   Hip Flexor Stretch 2 reps;30  seconds  manual   Manual Therapy   Manual Therapy Massage   Massage left psoas,   manual stretch of left hip adductors and quads in supine                PT Education - 07/21/14 1146    Education provided Yes   Education Details lower abdominal contraction with hip in/out and alternate shoulder flexion    Person(s) Educated Patient   Methods Explanation;Demonstration;Tactile cues;Verbal cues;Handout   Comprehension Verbalized understanding;Returned demonstration          PT Short Term Goals - 07/21/14 1151    PT SHORT TERM GOAL #1   Title be independent in initial HEP   Time 3   Period Weeks   Status On-going  still learing exercises   PT SHORT TERM GOAL #2   Title stand for housework and kitchen activity for 15 minutes with  upright posture and no limitaition   Time 3   Period Weeks   Status New  just started therapy   PT SHORT TERM GOAL #3   Title report a 20% reduction in LBP with standing and walking           PT Long Term Goals - 07/14/14 0929    PT LONG TERM GOAL #1   Title be independent in advanced HEP   Time 6   Period Weeks   Status New   PT LONG TERM GOAL #2   Title reduce FOTO to < or = to 42% limitation   Time 6   Period Weeks   PT LONG TERM GOAL #3   Title stand for 20 minutes for home tasks with upright posture   Time 6   Period Weeks   Status New   PT LONG TERM GOAL #4   Title report a 40% reduction in LBP with standing acitivity   Time 6   Period Weeks   Status New   PT LONG TERM GOAL #5   Title ---               Plan - 07/21/14 1147    Clinical Impression Statement Patient is a 64 year old female with back and right hip pain.  She is s/p left anterior left hip replacement.  Patient has not met goals yet due to just starting physical therapy.  Patient has a tight left psoas that will pull on her back with left hip extension. Patient has decreased length of left hip adductors.  Patient is going to get a heel left to reduce the unlevel pelvis until she has the right hip replaced. Patient is working on lumbar stabiliation focusing on keeping pelvis in neutral position. After therapy patient did not have pain.    Pt will benefit from skilled therapeutic intervention in order to improve on the following deficits Impaired flexibility;Pain;Improper body mechanics;Decreased mobility;Decreased activity tolerance;Decreased endurance;Decreased strength;Difficulty walking;Abnormal gait   Rehab Potential Good   PT Frequency 2x / week   PT Duration 6 weeks   PT Treatment/Interventions ADLs/Self Care Home Management;Therapeutic activities;Therapeutic exercise;Passive range of motion;Gait training;Manual techniques;Neuromuscular re-education;Electrical Stimulation;Cryotherapy   PT Next  Visit Plan bil. hip flexibility, core strength with pelvis in neutral.    PT Home Exercise Plan continue with established HEP   Consulted and Agree with Plan of Care Patient        Problem List Patient Active Problem List   Diagnosis Date Noted  . Contracture of right hip 07/08/2014  . Osteoarthritis of left hip  06/17/2014  . Status post total replacement of left hip 06/17/2014  . Contracture of left hip 06/03/2014  . Preoperative cardiovascular examination 04/22/2014  . PAF (paroxysmal atrial fibrillation) 04/22/2014  . Long term current use of antiarrhythmic drug 04/22/2014  . History of uterine cancer 04/22/2014  . Osteoarthritis, hip, bilateral 03/25/2014  . Lumbar and sacral osteoarthritis 12/10/2013    GRAY,CHERYL,PT 07/21/2014, 12:34 PM  [facility] [address] [place], [place], [address] Phone: [phone]   Fax:  [phone]

## 2014-07-21 NOTE — Patient Instructions (Signed)
Bracing With Knee Fallout (Hook-Lying)   With neutral spine, tighten pelvic floor and abdominals and hold. Alternating legs, drop knee out to side. Keep opposite hip still. Repeat _10__ times. Do _1__ times a day. Move hip in range without increased pain.   Copyright  VHI. All rights reserved.  Bracing With Arm Lift (Hook-Lying)   With neutral spine, tighten pelvic floor and abdominals and hold. Alternating arms, raise over head and return to side. Repeat _10__ times. Do 1___ times a day.   Copyright  VHI. All rights reserved.  Patient able to return demonstration correctly with above exercises.

## 2014-07-24 ENCOUNTER — Encounter: Payer: Self-pay | Admitting: Physical Therapy

## 2014-07-24 ENCOUNTER — Ambulatory Visit: Payer: Medicare Other | Admitting: Physical Therapy

## 2014-07-24 ENCOUNTER — Telehealth: Payer: Self-pay | Admitting: *Deleted

## 2014-07-24 DIAGNOSIS — M545 Low back pain, unspecified: Secondary | ICD-10-CM

## 2014-07-24 DIAGNOSIS — M25659 Stiffness of unspecified hip, not elsewhere classified: Secondary | ICD-10-CM

## 2014-07-24 DIAGNOSIS — Z96641 Presence of right artificial hip joint: Secondary | ICD-10-CM | POA: Diagnosis not present

## 2014-07-24 DIAGNOSIS — R6889 Other general symptoms and signs: Secondary | ICD-10-CM | POA: Diagnosis not present

## 2014-07-24 DIAGNOSIS — M25559 Pain in unspecified hip: Secondary | ICD-10-CM

## 2014-07-24 DIAGNOSIS — M419 Scoliosis, unspecified: Secondary | ICD-10-CM | POA: Diagnosis not present

## 2014-07-24 NOTE — Telephone Encounter (Signed)
Nehal called because she is scheduled to have a hip injection 07/29/14 with Dr Letta Pate.  She has made a definitive decision to have her hip replaced and has an appt with Dr Rush Farmer on 07/31/14.  She is uncertain if she should have the injection.  She placed a call to [facility] office but they have not called her back.  Please advise.

## 2014-07-24 NOTE — Therapy (Signed)
[facility] [address], [place], [place], [address] Phone: [phone]   Fax:  [phone]  Physical Therapy Treatment  Patient Details  Name: [name] MRN: [id number] Date of Birth: 04-16-1950 Referring Provider:  Bartholome Bill, MD  Encounter Date: 07/24/2014      PT End of Session - 07/24/14 1147    Visit Number 3   Number of Visits 10   Date for PT Re-Evaluation 08/25/14   PT Start Time 1100   PT Stop Time 1145   PT Time Calculation (min) 45 min   Activity Tolerance Patient tolerated treatment well   Behavior During Therapy [facility] for tasks assessed/performed      Past Medical History  Diagnosis Date  . Hypertension   . Swelling of both lower extremities   . Atrial fibrillation   . GERD (gastroesophageal reflux disease)   . Melanoma   . Skin cancer   . Dysrhythmia     dr Debara Pickett  . Scoliosis   . Difficult intubation     pt states that her neck needs to be in hyperextion, scoliosis    Past Surgical History  Procedure Laterality Date  . Abdominal hysterectomy  09/10/2013  . Melanoma excision  12/2011  . Vein surgery  05/2011    venous ablation   . Bunionectomy  10/1976  . Fracture surgery  09/1964  . Total hip arthroplasty Left 06/17/2014    Procedure: LEFT TOTAL HIP ARTHROPLASTY ANTERIOR APPROACH;  Surgeon: Mcarthur Rossetti, MD;  Location: [facility];  Service: Orthopedics;  Laterality: Left;    There were no vitals filed for this visit.  Visit Diagnosis:  Hip stiffness, unspecified laterality  Activity intolerance  Hip pain, unspecified laterality  Low back pain associated with a spinal disorder other than radiculopathy or spinal stenosis      Subjective Assessment - 07/24/14 1119    Subjective Pt is ready to have her right hip done, she has MD visit with Dr. Ninfa Linden on 07/31/14.   Pertinent History Lt lumbar ablasion, Lt total hip replacement   Limitations Standing;Walking   How  long can you stand comfortably? 10-15 minutes   How long can you walk comfortably? 20 minutes   Patient Stated Goals stand up straighter and reduce low back pain with standing   Currently in Pain? Yes   Pain Score 4   sometimes painfree   Pain Location Back   Pain Orientation Right;Lower   Pain Descriptors / Indicators Aching;Tightness;Dull   Pain Type Chronic pain   Pain Onset More than a month ago   Pain Frequency Intermittent   Multiple Pain Sites Yes   Pain Score 4   Pain Location Hip   Pain Orientation Right   Pain Descriptors / Indicators Aching   Pain Type Chronic pain   Pain Frequency Intermittent                         [facility] Adult PT Treatment/Exercise - 07/24/14 0001    Lumbar Exercises: Aerobic   Stationary Bike level 3 x 12 min   Lumbar Exercises: Machines for Strengthening   Leg Press St#7 incline #3  B LE 75# 3x10   Lumbar Exercises: Standing   Other Standing Lumbar Exercises vestibular board: DF/PF, Inv/Ever x81min each with abdominal activation   Other Standing Lumbar Exercises Hip Abduction each side 2x10, with focus on abdominal activation    Lumbar Exercises: Supine   Bridge 10  reps;5 seconds   Knee/Hip Exercises: Stretches   Hip Flexor Stretch 2 reps;30 seconds                  PT Short Term Goals - 07/21/14 1151    PT SHORT TERM GOAL #1   Title be independent in initial HEP   Time 3   Period Weeks   Status On-going  still learing exercises   PT SHORT TERM GOAL #2   Title stand for housework and kitchen activity for 15 minutes with upright posture and no limitaition   Time 3   Period Weeks   Status New  just started therapy   PT SHORT TERM GOAL #3   Title report a 20% reduction in LBP with standing and walking           PT Long Term Goals - 07/14/14 0929    PT LONG TERM GOAL #1   Title be independent in advanced HEP   Time 6   Period Weeks   Status New   PT LONG TERM GOAL #2   Title reduce FOTO to < or = to  42% limitation   Time 6   Period Weeks   PT LONG TERM GOAL #3   Title stand for 20 minutes for home tasks with upright posture   Time 6   Period Weeks   Status New   PT LONG TERM GOAL #4   Title report a 40% reduction in LBP with standing acitivity   Time 6   Period Weeks   Status New   PT LONG TERM GOAL #5   Title ---               Plan - 07/24/14 1148    Clinical Impression Statement Patient is progressing very well with strength in Lt hip, now Rt hip causes more issues due to pain   Pt will benefit from skilled therapeutic intervention in order to improve on the following deficits Impaired flexibility;Pain;Improper body mechanics;Decreased mobility;Decreased activity tolerance;Decreased endurance;Decreased strength;Difficulty walking;Abnormal gait   Rehab Potential Good   PT Frequency 2x / week   PT Duration 6 weeks   PT Treatment/Interventions ADLs/Self Care Home Management;Therapeutic activities;Therapeutic exercise;Passive range of motion;Gait training;Manual techniques;Neuromuscular re-education;Electrical Stimulation;Cryotherapy   PT Next Visit Plan bil. hip flexibility, core strength with pelvis in neutral.    PT Home Exercise Plan continue with established HEP   Consulted and Agree with Plan of Care Patient        Problem List Patient Active Problem List   Diagnosis Date Noted  . Contracture of right hip 07/08/2014  . Osteoarthritis of left hip 06/17/2014  . Status post total replacement of left hip 06/17/2014  . Contracture of left hip 06/03/2014  . Preoperative cardiovascular examination 04/22/2014  . PAF (paroxysmal atrial fibrillation) 04/22/2014  . Long term current use of antiarrhythmic drug 04/22/2014  . History of uterine cancer 04/22/2014  . Osteoarthritis, hip, bilateral 03/25/2014  . Lumbar and sacral osteoarthritis 12/10/2013    NAUMANN-HOUEGNIFIO,Jaidee Stipe PTA 07/24/2014, 11:50 AM  [facility] [address] [place], [place], [address] Phone: [phone]   Fax:  [phone]

## 2014-07-25 ENCOUNTER — Other Ambulatory Visit: Payer: Self-pay | Admitting: Surgery

## 2014-07-25 DIAGNOSIS — E041 Nontoxic single thyroid nodule: Secondary | ICD-10-CM

## 2014-07-25 NOTE — Telephone Encounter (Signed)
I would skip the injection if the surgery is imminent.  If there is going to be a delay with surgery and pt wants some additional pain relief we can schedule it at another time.  Cancel the 17th

## 2014-07-25 NOTE — Telephone Encounter (Signed)
Pt called back and decided to cancel her appointment, will call back to reschedule, patient is going to see her orthopedist on May 19th

## 2014-07-28 ENCOUNTER — Other Ambulatory Visit: Payer: Self-pay | Admitting: Surgery

## 2014-07-29 ENCOUNTER — Ambulatory Visit: Payer: Medicare Other

## 2014-07-29 ENCOUNTER — Ambulatory Visit: Payer: Medicare Other | Admitting: Physical Medicine & Rehabilitation

## 2014-07-31 ENCOUNTER — Ambulatory Visit: Payer: Medicare Other | Admitting: Physical Therapy

## 2014-07-31 DIAGNOSIS — R6889 Other general symptoms and signs: Secondary | ICD-10-CM | POA: Diagnosis not present

## 2014-07-31 DIAGNOSIS — M25659 Stiffness of unspecified hip, not elsewhere classified: Secondary | ICD-10-CM | POA: Diagnosis not present

## 2014-07-31 DIAGNOSIS — M25559 Pain in unspecified hip: Secondary | ICD-10-CM

## 2014-07-31 DIAGNOSIS — Z96641 Presence of right artificial hip joint: Secondary | ICD-10-CM | POA: Diagnosis not present

## 2014-07-31 DIAGNOSIS — M545 Low back pain, unspecified: Secondary | ICD-10-CM

## 2014-07-31 DIAGNOSIS — M419 Scoliosis, unspecified: Secondary | ICD-10-CM | POA: Diagnosis not present

## 2014-07-31 NOTE — Therapy (Signed)
[facility] [address] [place], [place], [address] Phone: [phone]   Fax:  [phone]  Physical Therapy Treatment  Patient Details  Name: [name] MRN: [id number] Date of Birth: Aug 29, 1950 Referring Provider:  Mcarthur Rossetti*  Encounter Date: 07/31/2014      PT End of Session - 07/31/14 1113    Visit Number 4   Number of Visits 10  Medicare   Date for PT Re-Evaluation 08/25/14   PT Start Time 1100   PT Stop Time 1145   PT Time Calculation (min) 45 min   Activity Tolerance Patient tolerated treatment well   Behavior During Therapy [facility] for tasks assessed/performed      Past Medical History  Diagnosis Date  . Hypertension   . Swelling of both lower extremities   . Atrial fibrillation   . GERD (gastroesophageal reflux disease)   . Melanoma   . Skin cancer   . Dysrhythmia     dr Debara Pickett  . Scoliosis   . Difficult intubation     pt states that her neck needs to be in hyperextion, scoliosis    Past Surgical History  Procedure Laterality Date  . Abdominal hysterectomy  09/10/2013  . Melanoma excision  12/2011  . Vein surgery  05/2011    venous ablation   . Bunionectomy  10/1976  . Fracture surgery  09/1964  . Total hip arthroplasty Left 06/17/2014    Procedure: LEFT TOTAL HIP ARTHROPLASTY ANTERIOR APPROACH;  Surgeon: Mcarthur Rossetti, MD;  Location: [facility];  Service: Orthopedics;  Laterality: Left;    There were no vitals filed for this visit.  Visit Diagnosis:  Activity intolerance  Hip stiffness, unspecified laterality  Hip pain, unspecified laterality  Low back pain associated with a spinal disorder other than radiculopathy or spinal stenosis      Subjective Assessment - 07/31/14 1111    Subjective I see Dr. Ninfa Linden today.  My right hip is bothering me. My back feels stronger and steadier on my feet.    Pertinent History Lt lumbar ablasion, Lt total hip replacement   Limitations Standing;Walking   How long can you stand comfortably? 10-15 minutes   How long can you walk comfortably? 20 minutes   Patient Stated Goals stand up straighter and reduce low back pain with standing   Currently in Pain? Yes   Pain Score 4    Pain Location Back   Pain Orientation Right;Left   Pain Descriptors / Indicators Aching;Dull;Tightness   Pain Type Chronic pain   Pain Onset More than a month ago   Pain Frequency Intermittent   Aggravating Factors  standing and when back is fatiqued   Pain Relieving Factors medication, rest, movement   Effect of Pain on Daily Activities limted standing   Multiple Pain Sites No            [facility] PT Assessment - 07/31/14 0001    Assessment   Medical Diagnosis low back pain degenerative scoliosis, s/p Lt anterior hip replacement on 06/17/14   Onset Date 06/17/14  surgery, LBP is chronic   Next MD Visit 07/31/14   Prior Function   Level of Independence Independent with basic ADLs   Observation/Other Assessments   Focus on Therapeutic Outcomes (FOTO)  50% limitation   PROM   Overall PROM Comments bil. hip extension -5   Strength   Overall Strength Comments bil. hip abd 4+/5, bil. hip extension 4/5.  [facility] Adult PT Treatment/Exercise - 07/31/14 0001    Lumbar Exercises: Aerobic   Stationary Bike level 3 x 12 min   Lumbar Exercises: Machines for Strengthening   Leg Press St#7 incline #3  B LE 75# 3x10   Lumbar Exercises: Standing   Other Standing Lumbar Exercises vestibular board: DF/PF, Inv/Ever x85mn each with abdominal activation   Other Standing Lumbar Exercises Hip Abduction each side 2x10, with focus on abdominal activation   tactile cues to fully extend knee   Lumbar Exercises: Supine   Bridge 20 reps  tactile cues to keep hips leveld                PT Education - 07/31/14 1138    Education provided No          PT Short Term Goals - 07/31/14 1114    PT SHORT TERM GOAL  #1   Title be independent in initial HEP   Time 3   Period Weeks   Status Achieved   PT SHORT TERM GOAL #2   Title stand for housework and kitchen activity for 15 minutes with upright posture and no limitaition   Time 3   Period Weeks   Status Achieved  then back hurst   PT SHORT TERM GOAL #3   Title report a 20% reduction in LBP with standing and walking   Time 3   Period Weeks   Status On-going  worse on right side           PT Long Term Goals - 07/31/14 1115    PT LONG TERM GOAL #1   Title be independent in advanced HEP   Time 6   Period Weeks   Status On-going   PT LONG TERM GOAL #2   Time 6   Period Weeks   Status On-going   PT LONG TERM GOAL #3   Title stand for 20 minutes for home tasks with upright posture   Time 6   Period Weeks   Status On-going  15 min.    PT LONG TERM GOAL #4   Title report a 40% reduction in LBP with standing acitivity   Time 6   Period Weeks   Status On-going  worse on right side               Plan - 07/31/14 1138    Clinical Impression Statement Patient is a 64year old female with left anterior THR.  Patient has low back pain due to stenosis, scoliosis, and degeneration of the lumbar.  Patient right hip hurts and she is having more pain in lumbar on the right.  Patient has increased left hip strength and improved bil. hip extension.  Patient has not met goals with pain due to her back pain not changing.  Patient  reports  improved stability in left hip and back is stronger.  Patient carries he rpain for security.  Patient would benefit  from further strengthing her back and hips.    Pt will benefit from skilled therapeutic intervention in order to improve on the following deficits Impaired flexibility;Pain;Improper body mechanics;Decreased mobility;Decreased activity tolerance;Decreased endurance;Decreased strength;Difficulty walking;Abnormal gait   PT Frequency 2x / week   PT Duration 6 weeks   PT Treatment/Interventions  ADLs/Self Care Home Management;Therapeutic activities;Therapeutic exercise;Passive range of motion;Gait training;Manual techniques;Neuromuscular re-education;Electrical Stimulation;Cryotherapy   PT Next Visit Plan bil. hip flexibility, core strength with pelvis in neutral.    PT Home Exercise Plan continue with established HEP  Consulted and Agree with Plan of Care Patient        Problem List Patient Active Problem List   Diagnosis Date Noted  . Contracture of right hip 07/08/2014  . Osteoarthritis of left hip 06/17/2014  . Status post total replacement of left hip 06/17/2014  . Contracture of left hip 06/03/2014  . Preoperative cardiovascular examination 04/22/2014  . PAF (paroxysmal atrial fibrillation) 04/22/2014  . Long term current use of antiarrhythmic drug 04/22/2014  . History of uterine cancer 04/22/2014  . Osteoarthritis, hip, bilateral 03/25/2014  . Lumbar and sacral osteoarthritis 12/10/2013    Ezeriah Luty,PT 07/31/2014, 11:44 AM  [facility] [address] [place], [place], [address] Phone: [phone]   Fax:  [phone]

## 2014-08-01 ENCOUNTER — Other Ambulatory Visit: Payer: Self-pay | Admitting: Surgery

## 2014-08-01 DIAGNOSIS — R609 Edema, unspecified: Secondary | ICD-10-CM | POA: Diagnosis not present

## 2014-08-01 DIAGNOSIS — D72829 Elevated white blood cell count, unspecified: Secondary | ICD-10-CM | POA: Diagnosis not present

## 2014-08-04 ENCOUNTER — Ambulatory Visit: Payer: Medicare Other | Admitting: Physical Therapy

## 2014-08-04 ENCOUNTER — Encounter: Payer: Self-pay | Admitting: Physical Therapy

## 2014-08-04 ENCOUNTER — Other Ambulatory Visit: Payer: Self-pay | Admitting: Surgery

## 2014-08-04 DIAGNOSIS — Z96641 Presence of right artificial hip joint: Secondary | ICD-10-CM | POA: Diagnosis not present

## 2014-08-04 DIAGNOSIS — R6889 Other general symptoms and signs: Secondary | ICD-10-CM

## 2014-08-04 DIAGNOSIS — M25659 Stiffness of unspecified hip, not elsewhere classified: Secondary | ICD-10-CM

## 2014-08-04 DIAGNOSIS — M25559 Pain in unspecified hip: Secondary | ICD-10-CM

## 2014-08-04 DIAGNOSIS — E041 Nontoxic single thyroid nodule: Secondary | ICD-10-CM

## 2014-08-04 DIAGNOSIS — M545 Low back pain: Secondary | ICD-10-CM | POA: Diagnosis not present

## 2014-08-04 DIAGNOSIS — M419 Scoliosis, unspecified: Secondary | ICD-10-CM | POA: Diagnosis not present

## 2014-08-04 NOTE — Therapy (Signed)
[facility] [address] [place], [place], [address] Phone: [phone]   Fax:  [phone]  Physical Therapy Treatment  Patient Details  Name: [name] MRN: [id number] Date of Birth: Aug 18, 1950 Referring Provider:  Mcarthur Rossetti*  Encounter Date: 08/04/2014      PT End of Session - 08/04/14 1116    Visit Number 5   Date for PT Re-Evaluation 08/25/14   PT Start Time 1100   PT Stop Time 1145   PT Time Calculation (min) 45 min   Activity Tolerance Patient tolerated treatment well   Behavior During Therapy [facility] for tasks assessed/performed      Past Medical History  Diagnosis Date  . Hypertension   . Swelling of both lower extremities   . Atrial fibrillation   . GERD (gastroesophageal reflux disease)   . Melanoma   . Skin cancer   . Dysrhythmia     dr Debara Pickett  . Scoliosis   . Difficult intubation     pt states that her neck needs to be in hyperextion, scoliosis    Past Surgical History  Procedure Laterality Date  . Abdominal hysterectomy  09/10/2013  . Melanoma excision  12/2011  . Vein surgery  05/2011    venous ablation   . Bunionectomy  10/1976  . Fracture surgery  09/1964  . Total hip arthroplasty Left 06/17/2014    Procedure: LEFT TOTAL HIP ARTHROPLASTY ANTERIOR APPROACH;  Surgeon: Mcarthur Rossetti, MD;  Location: [place];  Service: Orthopedics;  Laterality: Left;    There were no vitals filed for this visit.  Visit Diagnosis:  Hip stiffness, unspecified laterality  Activity intolerance  Hip pain, unspecified laterality      Subjective Assessment - 08/04/14 1111    Subjective I will have my other hip done on 09/05/2014.     Pertinent History Lt lumbar ablasion, Lt total hip replacement   Limitations Standing;Walking   How long can you stand comfortably? 10-15 minutes   How long can you walk comfortably? 20 minutes   Patient Stated Goals stand up straighter and reduce low back  pain with standing   Currently in Pain? Yes   Pain Score 1   right hip 4-5/10   Pain Location Back   Pain Orientation Right   Pain Descriptors / Indicators Stabbing;Aching;Dull   Pain Type Chronic pain   Pain Onset More than a month ago   Pain Frequency Intermittent   Aggravating Factors  standing and when back is fatiqued   Pain Relieving Factors medication, rest, movement   Effect of Pain on Daily Activities limited standing   Multiple Pain Sites No   Pain Score 5   Pain Location Hip   Pain Orientation Right   Pain Descriptors / Indicators Aching;Stabbing   Pain Type Chronic pain   Pain Frequency Intermittent            [facility] PT Assessment - 08/04/14 0001    Assessment   Medical Diagnosis low back pain degenerative scoliosis, s/p Lt anterior hip replacement on 06/17/14   Onset Date/Surgical Date 06/17/14  surgery, LBP is chronic   Prior Function   Level of Independence Independent with basic ADLs   Observation/Other Assessments   Focus on Therapeutic Outcomes (FOTO)  53% limitation   PROM   Overall PROM Comments bil. hip extension -5   Strength   Overall Strength Comments bil. hip abd 4+/5, bil. hip extension 4/5.  [place] Adult PT Treatment/Exercise - 08-15-2014 0001    Lumbar Exercises: Stretches   Single Knee to Chest Stretch 3 reps;30 seconds   Lumbar Exercises: Aerobic   Stationary Bike level 3 x 12 min   Lumbar Exercises: Standing   Other Standing Lumbar Exercises hip extension 20x; heel raises 30 times; Standing hip ER    Other Standing Lumbar Exercises Hip Abduction each side 2x10, with focus on abdominal activation   tactile cues to fully extend knee   Lumbar Exercises: Supine   Bridge 20 reps  tactile cues to keep hips leveld   Knee/Hip Exercises: Stretches   Hip Flexor Stretch 2 reps;30 seconds   Knee/Hip Exercises: Prone   Hip Extension --  prone on elbows to stretch hip flexors                PT Education -  08-15-2014 1142    Education Details reviewed HEP    Person(s) Educated Patient   Methods Explanation;Demonstration   Comprehension Verbalized understanding;Returned demonstration          PT Short Term Goals - August 15, 2014 1117    PT SHORT TERM GOAL #3   Title report a 20% reduction in LBP with standing and walking   Time 3   Period Weeks   Status Achieved           PT Long Term Goals - 08-15-14 1117    PT LONG TERM GOAL #1   Title be independent in advanced HEP   Time 6   Period Weeks   Status Achieved   PT LONG TERM GOAL #3   Title stand for 20 minutes for home tasks with upright posture   Time 6   Period Weeks   Status Not Met  10 min.   PT LONG TERM GOAL #4   Title report a 40% reduction in LBP with standing acitivity   Time 6   Period Weeks   Status Not Met  20%               Plan - 08-15-2014 1143    Clinical Impression Statement Patient is a 64 year old female with left anterior THR.  Patient is scheduled for right anterior hip replacementon 09/05/2014.  Patient will be discharged to save the rest of her physical therapy visits for after the right hip have surgery.  Patient is indepenent with her current HEP and understands how to  continuer her exercises so she is ready for the right THR.  Patient is able to stand with 20% less pain in back and hip.  Patient  has increased strength of left hip and core stabillizers. Patient has met her STG's but not all of her LTG's due to right hip pain. Patient FOTO score is 53% limitaiton due to right hip pain that is limiting her.    Pt will benefit from skilled therapeutic intervention in order to improve on the following deficits Impaired flexibility;Pain;Improper body mechanics;Decreased mobility;Decreased activity tolerance;Decreased endurance;Decreased strength;Difficulty walking;Abnormal gait   Rehab Potential Good   PT Treatment/Interventions ADLs/Self Care Home Management;Therapeutic activities;Therapeutic  exercise;Passive range of motion;Gait training;Manual techniques;Neuromuscular re-education;Electrical Stimulation;Cryotherapy   PT Next Visit Plan discharge to HEP   PT Home Exercise Plan Discharge to HEP   Consulted and Agree with Plan of Care Patient          G-Codes - 08-15-2014 1123    Functional Assessment Tool Used FOTO score is 53% limitation   Functional Limitation Mobility: Walking and moving around  Mobility: Walking and Moving Around Goal Status [phone]) At least 40 percent but less than 60 percent impaired, limited or restricted   Mobility: Walking and Moving Around Discharge Status [phone]) At least 40 percent but less than 60 percent impaired, limited or restricted      Problem List Patient Active Problem List   Diagnosis Date Noted  . Contracture of right hip 07/08/2014  . Osteoarthritis of left hip 06/17/2014  . Status post total replacement of left hip 06/17/2014  . Contracture of left hip 06/03/2014  . Preoperative cardiovascular examination 04/22/2014  . PAF (paroxysmal atrial fibrillation) 04/22/2014  . Long term current use of antiarrhythmic drug 04/22/2014  . History of uterine cancer 04/22/2014  . Osteoarthritis, hip, bilateral 03/25/2014  . Lumbar and sacral osteoarthritis 12/10/2013    GRAY,CHERYL,PT 08/04/2014, 11:50 AM  [facility] [address] [place], [place], [address] Phone: [phone]   Fax:  [phone]   PHYSICAL THERAPY DISCHARGE SUMMARY  Visits from Start of Care: 5  Current functional level related to goals / functional outcomes: See above goals for update. Patient as met her STG's but not her LTG's due to right hip pain.    Remaining deficits: Patient has right hip pain and will have a right anterior total hip replacement on 09/05/2014.     Education / Equipment: HEP Plan: Patient agrees to discharge.  Patient goals were partially met. Patient is being discharged  due to being pleased with the current functional level.  Thank you for the referral. Earlie Counts, PT 08/04/2014 11:50 AM ?????

## 2014-08-12 ENCOUNTER — Encounter: Payer: Medicare Other | Admitting: Physical Therapy

## 2014-08-14 ENCOUNTER — Encounter: Payer: Medicare Other | Admitting: Physical Therapy

## 2014-08-18 ENCOUNTER — Other Ambulatory Visit (HOSPITAL_COMMUNITY): Payer: Self-pay | Admitting: Orthopaedic Surgery

## 2014-08-25 ENCOUNTER — Ambulatory Visit (INDEPENDENT_AMBULATORY_CARE_PROVIDER_SITE_OTHER): Payer: Medicare Other | Admitting: Internal Medicine

## 2014-08-25 VITALS — BP 112/62 | HR 74 | Ht 70.0 in | Wt 224.0 lb

## 2014-08-25 DIAGNOSIS — Z79899 Other long term (current) drug therapy: Secondary | ICD-10-CM | POA: Diagnosis not present

## 2014-08-25 DIAGNOSIS — I480 Paroxysmal atrial fibrillation: Secondary | ICD-10-CM

## 2014-08-25 MED ORDER — FLECAINIDE ACETATE 50 MG PO TABS: 75.0000 mg | ORAL_TABLET | Freq: Two times a day (BID) | ORAL | Status: DC

## 2014-08-25 NOTE — Patient Instructions (Signed)
Dr Debara Pickett has recommended making the following medication changes: INCREASE Flecainide to 75 mg - take 1.5 tablets twice daily. A prescription has been sent to your pharmacy electronically.  Dr Debara Pickett recommends that you schedule a nurse visit, either Thursday or Friday, for an EKG.  Dr Debara Pickett recommends that you schedule a follow-up appointment in 2-3 months.

## 2014-08-26 ENCOUNTER — Inpatient Hospital Stay (HOSPITAL_COMMUNITY): Admission: RE | Admit: 2014-08-26 | Payer: Medicare Other | Source: Ambulatory Visit

## 2014-08-27 ENCOUNTER — Encounter: Payer: Self-pay | Admitting: Internal Medicine

## 2014-08-27 NOTE — Progress Notes (Signed)
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 She is from [place] and was living in the [place] area. Her past medical history is significant for atrial fibrillation. This was first noted in 2012 and she was controlled initially with diltiazem. She was in a hospital in atrial fibrillation and spontaneously converted back to sinus. Subsequently she had some breakthroughs in her dose of diltiazem was increased. Eventually she saw different cardiologist and was placed on flecainide 50 mg twice a day has had only one breakthrough that she is aware of since starting on this medication. This was associated with a UTI which she was given Cipro, which I cautioned her about using in the future due to QT interactions. She also has a history of uterine cancer fortunately and is a survivor. She continues to have problems with bilateral hip osteoarthritis and is scheduled to have left hip replacement in the near future. She denies any chest pain or shortness of breath with exertion. She reports her prior cardiologist in [place] did stress testing within the last 2 years and has done several stress tests and she started with atrial fibrillation. There is no family history of coronary disease although cancer is somewhat rapid in the family.  [name] is doing fairly well today. She denies any chest pain or shortness of breath. She reports no recurrence of A. fib that she is aware of. She is maintaining normal sinus rhythm and is on flecainide 50 mg twice a day. QTc interval is 432 ms. She is also taking aspirin 650 mg daily.  PMHx:  Past Medical History  Diagnosis Date  . Hypertension   . Swelling of both lower extremities   . Atrial fibrillation   . GERD (gastroesophageal reflux disease)   . Melanoma   . Skin cancer   . 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  Scoliosis   . Difficult intubation     pt states that her neck needs to be in hyperextion, scoliosis    Past Surgical History  Procedure Laterality Date  . Abdominal hysterectomy  09/10/2013  . Melanoma excision  12/2011  . Vein surgery  05/2011    venous ablation   . Bunionectomy  10/1976  . Fracture surgery  09/1964  . Total hip arthroplasty Left 06/17/2014    Procedure: LEFT TOTAL HIP ARTHROPLASTY ANTERIOR APPROACH;  Surgeon: Shelly Rossetti, MD;  Location: [place];  Service: Orthopedics;  Laterality: Left;    FAMHx:  Family History  Problem Relation Age of Onset  . Uterine cancer Mother   . Diabetes Brother 65  . Hypertension Father 79  . Hyperlipidemia Father   . Cancer Father 8  . Epilepsy Father 59  . Arthritis Sister 8  . Cancer Maternal Grandmother   . Stroke Paternal Grandfather   . Arthritis Sister     SOCHx:   reports that she has never smoked. She has never used smokeless tobacco. She reports that she drinks alcohol. She reports that she does not use illicit drugs.  ALLERGIES:  Allergies  Allergen Reactions  . Hydrolyzed Silk Hives and Other (See Comments)    Silk tape-blisters  . Gluten Meal Other (See Comments)    reflux    ROS: A comprehensive review of systems was negative.  HOME MEDS: Current Outpatient Prescriptions  Medication  Sig Dispense Refill  . spironolactone (ALDACTONE) 50 MG tablet Take 100 mg by mouth. 100 mg am and 50 mg pm    . aspirin EC 325 MG EC tablet Take 1 tablet (325 mg total) by mouth 2 (two) times daily after a meal. 30 tablet 0  . diltiazem (TIAZAC) 240 MG 24 hr capsule Take 1 capsule (240 mg total) by mouth daily. 90 capsule 3  . flecainide (TAMBOCOR) 50 MG tablet Take 1.5 tablets (75 mg total) by mouth 2 (two) times daily. 270 tablet 3  . pantoprazole (PROTONIX) 40 MG tablet Take 40 mg by mouth daily.   1  . senna (SENOKOT) 8.6 MG tablet Take 1 tablet by mouth daily.    . traMADol (ULTRAM) 50 MG tablet Take 1 tablet  (50 mg total) by mouth 3 (three) times daily as needed (Take 1 tablet every 8 hours as needed for pain). 90 tablet 1  . triamcinolone cream (KENALOG) 0.1 % Apply 1 application topically as needed (for dry skin).   1   No current facility-administered medications for this visit.    LABS/IMAGING: No results found for this or any previous visit (from the past 48 hour(s)). No results found.  VITALS: BP 112/62 mmHg  Pulse 74  Ht 5\' 10"  (1.778 m)  Wt 224 lb (101.606 kg)  BMI 32.14 kg/m2  EXAM: General appearance: alert and no distress Neck: no carotid bruit, no JVD and thyroid not enlarged, symmetric, no tenderness/mass/nodules Lungs: clear to auscultation bilaterally Heart: regular rate and rhythm, S1, S2 normal, no murmur, click, rub or gallop Abdomen: soft, non-tender; bowel sounds normal; no masses,  no organomegaly Extremities: extremities normal, atraumatic, no cyanosis or edema Pulses: 2+ and symmetric Skin: Skin color, texture, turgor normal. No rashes or lesions Neurologic: Grossly normal Psych: Pleasant'  EKG: Normal sinus rhythm at 74  ASSESSMENT: 1. History of paroxysmal atrial fibrillation - CHADSVASC of 1 (female/64) 2. Long term antiarrhythmic therapy on flecainide 3. Low risk for upcoming Right hip surgery 4. History of uterine cancer 5. Bilateral hip osteoarthritis  PLAN: 1.   [name] underwent hip surgery, but did have some post-op a-fib. Overall she is doing really well and contemplating actually having surgery on her other hip in 2 weeks. She is maintaining sinus rhythm. She's currently on aspirin however I did counsel her that her CHADSVASC score is borderline in that she may wish to consider increase anticoagulation at some point, particularly if she has recurrence of her A. fib. She reports some occasional palpitations but she is in sinus rhythm today. I'm concerned that she could be having some breakthrough A. fib. I'm recommending increasing her  flecainide to 75 mg twice daily. Hopefully this will give her some protection for his next surgery which is next week. She will need another EKG later this week to assess her QT interval. I would recommend continuing her on flecainide perioperatively to avoid the risk of postoperative A. fib which is the issue last time.  Pixie Casino, MD, [facility] Attending Cardiologist [place] Kaylon Laroche 08/27/2014, 5:55 PM

## 2014-08-28 ENCOUNTER — Encounter (HOSPITAL_COMMUNITY): Payer: Self-pay

## 2014-08-28 ENCOUNTER — Encounter (HOSPITAL_COMMUNITY)
Admission: RE | Admit: 2014-08-28 | Discharge: 2014-08-28 | Disposition: A | Discharge status: Home / Self Care | Payer: Medicare Other | Source: Ambulatory Visit | Attending: Orthopaedic Surgery | Admitting: Orthopaedic Surgery

## 2014-08-28 ENCOUNTER — Ambulatory Visit (INDEPENDENT_AMBULATORY_CARE_PROVIDER_SITE_OTHER): Payer: Medicare Other

## 2014-08-28 VITALS — BP 124/66 | HR 76 | Ht 70.5 in | Wt 224.4 lb

## 2014-08-28 DIAGNOSIS — Z79899 Other long term (current) drug therapy: Secondary | ICD-10-CM | POA: Insufficient documentation

## 2014-08-28 DIAGNOSIS — I480 Paroxysmal atrial fibrillation: Secondary | ICD-10-CM

## 2014-08-28 DIAGNOSIS — M1611 Unilateral primary osteoarthritis, right hip: Secondary | ICD-10-CM | POA: Diagnosis not present

## 2014-08-28 DIAGNOSIS — Z01812 Encounter for preprocedural laboratory examination: Secondary | ICD-10-CM | POA: Diagnosis not present

## 2014-08-28 HISTORY — DX: Unspecified urinary incontinence: R32

## 2014-08-28 HISTORY — DX: Nontoxic single thyroid nodule: E04.1

## 2014-08-28 HISTORY — DX: Other specified postprocedural states: Z98.890

## 2014-08-28 HISTORY — DX: Other specified postprocedural states: R11.2

## 2014-08-28 HISTORY — DX: Peripheral vascular disease, unspecified: I73.9

## 2014-08-28 LAB — BASIC METABOLIC PANEL
ANION GAP: 9 (ref 5–15)
BUN: 27 mg/dL — AB (ref 6–20)
CO2: 22 mmol/L (ref 22–32)
Calcium: 9.6 mg/dL (ref 8.9–10.3)
Chloride: 106 mmol/L (ref 101–111)
Creatinine, Ser: 1.01 mg/dL — ABNORMAL HIGH (ref 0.44–1.00)
GFR calc non Af Amer: 57 mL/min — ABNORMAL LOW (ref >60)
Glucose, Bld: 114 mg/dL — ABNORMAL HIGH (ref 65–99)
Potassium: 4.3 mmol/L (ref 3.5–5.1)
Sodium: 137 mmol/L (ref 135–145)

## 2014-08-28 LAB — CBC
HCT: 41.1 % (ref 36.0–46.0)
HEMOGLOBIN: 14.2 g/dL (ref 12.0–15.0)
MCH: 34.8 pg — AB (ref 26.0–34.0)
MCHC: 34.5 g/dL (ref 30.0–36.0)
MCV: 100.7 fL — AB (ref 78.0–100.0)
PLATELETS: 241 10*3/uL (ref 150–400)
RBC: 4.08 MIL/uL (ref 3.87–5.11)
RDW: 12.1 % (ref 11.5–15.5)
WBC: 8 10*3/uL (ref 4.0–10.5)

## 2014-08-28 LAB — SURGICAL PCR SCREEN
MRSA, PCR: NEGATIVE
STAPHYLOCOCCUS AUREUS: NEGATIVE

## 2014-08-28 NOTE — Progress Notes (Signed)
Instructed MD's office to let patient know if she needs to stop taking Aspirin prior to surgery.  Patient stated that she did not have to stop Aspirin prior to hip surgery in April.  Patient verbalized understanding.

## 2014-08-28 NOTE — Patient Instructions (Signed)
Continue same medications.

## 2014-08-28 NOTE — Progress Notes (Signed)
PCP- Dr. Luciana Axe  Cardiologist- Dr. Debara Pickett  EKG- Pt. States EKG done at Dr. Lysbeth Penner office on 08/28/2014  Stress- 08/17/2011 in [place]- denies

## 2014-08-28 NOTE — Pre-Procedure Instructions (Signed)
    [name]  08/28/2014      [id number] - [place], [address] AT [facility] [address] [place] [address] [place] [address] Phone: [phone] Fax: [phone]    Your procedure is scheduled on Tuesday, June 21st, 2016 at 12:55 PM.  Report to [facility] Admitting at 10:55 A.M.  Call this number if you have problems the morning of surgery:  [phone]   Remember:  Do not eat food or drink liquids after midnight.   Take these medicines the morning of surgery with A SIP OF WATER Diltiazem (Tiazac), Flecainide (Tambocor), Pantoprazole (Protonix), Tramadol (Ultram)  Stop taking: NSAIDS, Ibuprofen, Aleve, Naproxen, Fish Oil, and herbal medications.     Do not wear jewelry, make-up or nail polish.  Do not wear lotions, powders, or perfumes.  You may wear deodorant.  Do not shave 48 hours prior to surgery.  Men may shave face and neck.  Do not bring valuables to the hospital.  [facility] is not responsible for any belongings or valuables.  Contacts, dentures or bridgework may not be worn into surgery.  Leave your suitcase in the car.  After surgery it may be brought to your room.  For patients admitted to the hospital, discharge time will be determined by your treatment team.  Patients discharged the day of surgery will not be allowed to drive home.    Special instructions:  See attached.   Please read over the following fact sheets that you were given. Pain Booklet, Coughing and Deep Breathing, MRSA Information and Surgical Site Infection Prevention

## 2014-08-28 NOTE — Progress Notes (Signed)
1.) Reason for visit: EKG  2.) Name of MD requesting visit: Dr.Hilty  3.) H&P: AFib,Flecainide increased to 75 mg twice a day on 08/25/14.  4.) ROS related to problem: no complaints,scheduled for hip replacement surgery 09/02/14.  5.) Assessment and plan per MD: Continue same medications.

## 2014-09-01 MED ORDER — CEFAZOLIN SODIUM-DEXTROSE 2-3 GM-% IV SOLR
2.0000 g | INTRAVENOUS | Status: AC
  Administered 2014-09-02: 2 g via INTRAVENOUS
  Filled 2014-09-01 (×2): qty 50

## 2014-09-02 ENCOUNTER — Ambulatory Visit: Payer: Medicare Other | Admitting: Physical Medicine & Rehabilitation

## 2014-09-02 ENCOUNTER — Inpatient Hospital Stay (HOSPITAL_COMMUNITY): Payer: Medicare Other

## 2014-09-02 ENCOUNTER — Encounter (HOSPITAL_COMMUNITY)
Admission: RE | Disposition: A | Discharge status: Home Health | Payer: Self-pay | Source: Ambulatory Visit | Attending: Orthopaedic Surgery

## 2014-09-02 ENCOUNTER — Inpatient Hospital Stay (HOSPITAL_COMMUNITY)
Admission: RE | Admit: 2014-09-02 | Discharge: 2014-09-04 | DRG: 470 | Disposition: A | Discharge status: Home Health | Payer: Medicare Other | Source: Ambulatory Visit | Attending: Orthopaedic Surgery | Admitting: Orthopaedic Surgery

## 2014-09-02 ENCOUNTER — Inpatient Hospital Stay (HOSPITAL_COMMUNITY): Payer: Medicare Other | Admitting: Vascular Surgery

## 2014-09-02 ENCOUNTER — Inpatient Hospital Stay (HOSPITAL_COMMUNITY): Payer: Medicare Other | Admitting: Anesthesiology

## 2014-09-02 ENCOUNTER — Encounter (HOSPITAL_COMMUNITY): Payer: Self-pay | Admitting: *Deleted

## 2014-09-02 DIAGNOSIS — Z96642 Presence of left artificial hip joint: Secondary | ICD-10-CM | POA: Diagnosis present

## 2014-09-02 DIAGNOSIS — I480 Paroxysmal atrial fibrillation: Secondary | ICD-10-CM | POA: Diagnosis present

## 2014-09-02 DIAGNOSIS — M24551 Contracture, right hip: Secondary | ICD-10-CM | POA: Diagnosis present

## 2014-09-02 DIAGNOSIS — Z8261 Family history of arthritis: Secondary | ICD-10-CM

## 2014-09-02 DIAGNOSIS — M1611 Unilateral primary osteoarthritis, right hip: Principal | ICD-10-CM | POA: Diagnosis present

## 2014-09-02 DIAGNOSIS — K219 Gastro-esophageal reflux disease without esophagitis: Secondary | ICD-10-CM | POA: Diagnosis present

## 2014-09-02 DIAGNOSIS — I739 Peripheral vascular disease, unspecified: Secondary | ICD-10-CM | POA: Diagnosis present

## 2014-09-02 DIAGNOSIS — Z79899 Other long term (current) drug therapy: Secondary | ICD-10-CM

## 2014-09-02 DIAGNOSIS — Z96641 Presence of right artificial hip joint: Secondary | ICD-10-CM

## 2014-09-02 DIAGNOSIS — Z833 Family history of diabetes mellitus: Secondary | ICD-10-CM | POA: Diagnosis not present

## 2014-09-02 DIAGNOSIS — Z823 Family history of stroke: Secondary | ICD-10-CM

## 2014-09-02 DIAGNOSIS — Z7982 Long term (current) use of aspirin: Secondary | ICD-10-CM

## 2014-09-02 DIAGNOSIS — M169 Osteoarthritis of hip, unspecified: Secondary | ICD-10-CM | POA: Diagnosis not present

## 2014-09-02 DIAGNOSIS — Z8249 Family history of ischemic heart disease and other diseases of the circulatory system: Secondary | ICD-10-CM | POA: Diagnosis not present

## 2014-09-02 DIAGNOSIS — Z8049 Family history of malignant neoplasm of other genital organs: Secondary | ICD-10-CM | POA: Diagnosis not present

## 2014-09-02 DIAGNOSIS — Z8542 Personal history of malignant neoplasm of other parts of uterus: Secondary | ICD-10-CM

## 2014-09-02 DIAGNOSIS — M25551 Pain in right hip: Secondary | ICD-10-CM | POA: Diagnosis not present

## 2014-09-02 DIAGNOSIS — I4891 Unspecified atrial fibrillation: Secondary | ICD-10-CM | POA: Diagnosis not present

## 2014-09-02 DIAGNOSIS — Z820 Family history of epilepsy and other diseases of the nervous system: Secondary | ICD-10-CM | POA: Diagnosis not present

## 2014-09-02 DIAGNOSIS — Z471 Aftercare following joint replacement surgery: Secondary | ICD-10-CM | POA: Diagnosis not present

## 2014-09-02 DIAGNOSIS — Z419 Encounter for procedure for purposes other than remedying health state, unspecified: Secondary | ICD-10-CM

## 2014-09-02 HISTORY — PX: TOTAL HIP ARTHROPLASTY: SHX124

## 2014-09-02 SURGERY — ARTHROPLASTY, HIP, TOTAL, ANTERIOR APPROACH
Anesthesia: Choice | Site: Hip | Laterality: Right

## 2014-09-02 MED ORDER — SPIRONOLACTONE 50 MG PO TABS
100.0000 mg | ORAL_TABLET | Freq: Every day | ORAL | Status: DC
  Administered 2014-09-03 – 2014-09-04 (×2): 100 mg via ORAL
  Filled 2014-09-02 (×3): qty 2

## 2014-09-02 MED ORDER — LIDOCAINE HCL (CARDIAC) 20 MG/ML IV SOLN
INTRAVENOUS | Status: DC | PRN
  Administered 2014-09-02: 50 mg via INTRAVENOUS

## 2014-09-02 MED ORDER — CALCIUM CARBONATE-VITAMIN D 500-200 MG-UNIT PO TABS
1.0000 | ORAL_TABLET | Freq: Every day | ORAL | Status: DC
  Administered 2014-09-03 – 2014-09-04 (×2): 1 via ORAL
  Filled 2014-09-02 (×2): qty 1

## 2014-09-02 MED ORDER — BISACODYL 10 MG RE SUPP: 10.0000 mg | Freq: Every day | RECTAL | Status: DC | PRN

## 2014-09-02 MED ORDER — METOCLOPRAMIDE HCL 5 MG PO TABS: 5.0000 mg | ORAL_TABLET | Freq: Three times a day (TID) | ORAL | Status: DC | PRN

## 2014-09-02 MED ORDER — DILTIAZEM HCL ER BEADS 240 MG PO CP24
240.0000 mg | ORAL_CAPSULE | Freq: Every day | ORAL | Status: DC
  Administered 2014-09-03 – 2014-09-04 (×2): 240 mg via ORAL
  Filled 2014-09-02 (×2): qty 1

## 2014-09-02 MED ORDER — ONDANSETRON HCL 4 MG/2ML IJ SOLN
INTRAMUSCULAR | Status: DC | PRN
  Administered 2014-09-02: 4 mg via INTRAVENOUS

## 2014-09-02 MED ORDER — PROPOFOL 10 MG/ML IV BOLUS
INTRAVENOUS | Status: DC | PRN
  Administered 2014-09-02: 180 mg via INTRAVENOUS

## 2014-09-02 MED ORDER — DOCUSATE SODIUM 100 MG PO CAPS
100.0000 mg | ORAL_CAPSULE | Freq: Two times a day (BID) | ORAL | Status: DC
  Administered 2014-09-02 – 2014-09-04 (×4): 100 mg via ORAL
  Filled 2014-09-02 (×4): qty 1

## 2014-09-02 MED ORDER — FLECAINIDE ACETATE 50 MG PO TABS
75.0000 mg | ORAL_TABLET | Freq: Two times a day (BID) | ORAL | Status: DC
  Administered 2014-09-02 – 2014-09-04 (×4): 75 mg via ORAL
  Filled 2014-09-02 (×6): qty 2

## 2014-09-02 MED ORDER — FENTANYL CITRATE (PF) 100 MCG/2ML IJ SOLN
INTRAMUSCULAR | Status: DC | PRN
  Administered 2014-09-02: 100 ug via INTRAVENOUS
  Administered 2014-09-02 (×6): 50 ug via INTRAVENOUS

## 2014-09-02 MED ORDER — POLYETHYLENE GLYCOL 3350 17 G PO PACK: 17.0000 g | PACK | Freq: Every day | ORAL | Status: DC | PRN

## 2014-09-02 MED ORDER — FENTANYL CITRATE (PF) 100 MCG/2ML IJ SOLN
INTRAMUSCULAR | Status: AC
  Filled 2014-09-02: qty 2

## 2014-09-02 MED ORDER — ALUM & MAG HYDROXIDE-SIMETH 200-200-20 MG/5ML PO SUSP: 30.0000 mL | ORAL | Status: DC | PRN

## 2014-09-02 MED ORDER — PANTOPRAZOLE SODIUM 40 MG PO TBEC
40.0000 mg | DELAYED_RELEASE_TABLET | Freq: Every day | ORAL | Status: DC
  Administered 2014-09-03 – 2014-09-04 (×2): 40 mg via ORAL
  Filled 2014-09-02 (×2): qty 1

## 2014-09-02 MED ORDER — MENTHOL 3 MG MT LOZG: 1.0000 | LOZENGE | OROMUCOSAL | Status: DC | PRN

## 2014-09-02 MED ORDER — CALCIUM-VITAMIN D 600-400 MG-UNIT PO TABS: 1.0000 | ORAL_TABLET | Freq: Two times a day (BID) | ORAL | Status: DC

## 2014-09-02 MED ORDER — MIDAZOLAM HCL 2 MG/2ML IJ SOLN
INTRAMUSCULAR | Status: AC
  Filled 2014-09-02: qty 2

## 2014-09-02 MED ORDER — HYDROMORPHONE HCL 1 MG/ML IJ SOLN: 1.0000 mg | INTRAMUSCULAR | Status: DC | PRN

## 2014-09-02 MED ORDER — METOCLOPRAMIDE HCL 5 MG/ML IJ SOLN
5.0000 mg | Freq: Three times a day (TID) | INTRAMUSCULAR | Status: DC | PRN
  Administered 2014-09-03: 10 mg via INTRAVENOUS
  Filled 2014-09-02: qty 2

## 2014-09-02 MED ORDER — SPIRONOLACTONE 50 MG PO TABS
50.0000 mg | ORAL_TABLET | Freq: Every day | ORAL | Status: DC
  Administered 2014-09-02 – 2014-09-03 (×2): 50 mg via ORAL
  Filled 2014-09-02 (×3): qty 1

## 2014-09-02 MED ORDER — OXYCODONE HCL 5 MG PO TABS
ORAL_TABLET | ORAL | Status: AC
  Filled 2014-09-02: qty 1

## 2014-09-02 MED ORDER — PHENOL 1.4 % MT LIQD: 1.0000 | OROMUCOSAL | Status: DC | PRN

## 2014-09-02 MED ORDER — ONDANSETRON HCL 4 MG PO TABS: 4.0000 mg | ORAL_TABLET | Freq: Four times a day (QID) | ORAL | Status: DC | PRN

## 2014-09-02 MED ORDER — METHOCARBAMOL 1000 MG/10ML IJ SOLN
500.0000 mg | Freq: Four times a day (QID) | INTRAVENOUS | Status: DC | PRN
  Administered 2014-09-02: 500 mg via INTRAVENOUS
  Filled 2014-09-02 (×3): qty 5

## 2014-09-02 MED ORDER — ONDANSETRON HCL 4 MG/2ML IJ SOLN: 4.0000 mg | Freq: Once | INTRAMUSCULAR | Status: DC | PRN

## 2014-09-02 MED ORDER — ACETAMINOPHEN 650 MG RE SUPP: 650.0000 mg | Freq: Four times a day (QID) | RECTAL | Status: DC | PRN

## 2014-09-02 MED ORDER — OXYCODONE HCL 5 MG PO TABS
5.0000 mg | ORAL_TABLET | Freq: Once | ORAL | Status: AC | PRN
  Administered 2014-09-02: 5 mg via ORAL

## 2014-09-02 MED ORDER — ASPIRIN EC 325 MG PO TBEC
325.0000 mg | DELAYED_RELEASE_TABLET | Freq: Two times a day (BID) | ORAL | Status: DC
  Administered 2014-09-02 – 2014-09-04 (×4): 325 mg via ORAL
  Filled 2014-09-02 (×3): qty 1

## 2014-09-02 MED ORDER — DIPHENHYDRAMINE HCL 12.5 MG/5ML PO ELIX: 12.5000 mg | ORAL_SOLUTION | ORAL | Status: DC | PRN

## 2014-09-02 MED ORDER — ACETAMINOPHEN 325 MG PO TABS: 650.0000 mg | ORAL_TABLET | Freq: Four times a day (QID) | ORAL | Status: DC | PRN

## 2014-09-02 MED ORDER — FENTANYL CITRATE (PF) 250 MCG/5ML IJ SOLN
INTRAMUSCULAR | Status: AC
  Filled 2014-09-02: qty 5

## 2014-09-02 MED ORDER — LACTATED RINGERS IV SOLN
INTRAVENOUS | Status: DC
  Administered 2014-09-02: 50 mL/h via INTRAVENOUS

## 2014-09-02 MED ORDER — OXYCODONE HCL 5 MG PO TABS
5.0000 mg | ORAL_TABLET | ORAL | Status: DC | PRN
  Administered 2014-09-02 – 2014-09-04 (×10): 10 mg via ORAL
  Filled 2014-09-02 (×10): qty 2

## 2014-09-02 MED ORDER — ZOLPIDEM TARTRATE 5 MG PO TABS: 5.0000 mg | ORAL_TABLET | Freq: Every evening | ORAL | Status: DC | PRN

## 2014-09-02 MED ORDER — SODIUM CHLORIDE 0.9 % IV SOLN
INTRAVENOUS | Status: DC
  Administered 2014-09-02: 23:00:00 via INTRAVENOUS

## 2014-09-02 MED ORDER — DEXAMETHASONE SODIUM PHOSPHATE 4 MG/ML IJ SOLN
INTRAMUSCULAR | Status: DC | PRN
  Administered 2014-09-02: 8 mg via INTRAVENOUS

## 2014-09-02 MED ORDER — ONDANSETRON HCL 4 MG/2ML IJ SOLN: 4.0000 mg | Freq: Four times a day (QID) | INTRAMUSCULAR | Status: DC | PRN

## 2014-09-02 MED ORDER — GLYCOPYRROLATE 0.2 MG/ML IJ SOLN
INTRAMUSCULAR | Status: DC | PRN
  Administered 2014-09-02: 0.6 mg via INTRAVENOUS

## 2014-09-02 MED ORDER — CEFAZOLIN SODIUM 1-5 GM-% IV SOLN
1.0000 g | Freq: Four times a day (QID) | INTRAVENOUS | Status: AC
  Administered 2014-09-02 – 2014-09-03 (×2): 1 g via INTRAVENOUS
  Filled 2014-09-02 (×3): qty 50

## 2014-09-02 MED ORDER — NEOSTIGMINE METHYLSULFATE 10 MG/10ML IV SOLN
INTRAVENOUS | Status: DC | PRN
  Administered 2014-09-02: 5 mg via INTRAVENOUS

## 2014-09-02 MED ORDER — LACTATED RINGERS IV SOLN
INTRAVENOUS | Status: DC | PRN
  Administered 2014-09-02 (×3): via INTRAVENOUS

## 2014-09-02 MED ORDER — PROPOFOL 10 MG/ML IV BOLUS
INTRAVENOUS | Status: AC
  Filled 2014-09-02: qty 20

## 2014-09-02 MED ORDER — OXYCODONE HCL 5 MG/5ML PO SOLN: 5.0000 mg | Freq: Once | ORAL | Status: AC | PRN

## 2014-09-02 MED ORDER — METHOCARBAMOL 500 MG PO TABS
500.0000 mg | ORAL_TABLET | Freq: Four times a day (QID) | ORAL | Status: DC | PRN
  Administered 2014-09-02 – 2014-09-03 (×2): 500 mg via ORAL
  Filled 2014-09-02 (×4): qty 1

## 2014-09-02 MED ORDER — MIDAZOLAM HCL 5 MG/5ML IJ SOLN
INTRAMUSCULAR | Status: DC | PRN
  Administered 2014-09-02: 2 mg via INTRAVENOUS

## 2014-09-02 MED ORDER — FENTANYL CITRATE (PF) 100 MCG/2ML IJ SOLN
25.0000 ug | INTRAMUSCULAR | Status: DC | PRN
  Administered 2014-09-02 (×3): 50 ug via INTRAVENOUS

## 2014-09-02 MED ORDER — 0.9 % SODIUM CHLORIDE (POUR BTL) OPTIME
TOPICAL | Status: DC | PRN
  Administered 2014-09-02: 1000 mL

## 2014-09-02 MED ORDER — ROCURONIUM BROMIDE 100 MG/10ML IV SOLN
INTRAVENOUS | Status: DC | PRN
Start: 2014-09-02 — End: 2014-09-02
  Administered 2014-09-02: 50 mg via INTRAVENOUS

## 2014-09-02 SURGICAL SUPPLY — 54 items
BENZOIN TINCTURE PRP APPL 2/3 (GAUZE/BANDAGES/DRESSINGS) ×3 IMPLANT
BLADE SAW SGTL 18X1.27X75 (BLADE) ×2 IMPLANT
BLADE SAW SGTL 18X1.27X75MM (BLADE) ×1
BLADE SURG ROTATE 9660 (MISCELLANEOUS) IMPLANT
BNDG GAUZE ELAST 4 BULKY (GAUZE/BANDAGES/DRESSINGS) IMPLANT
CAPT HIP TOTAL 2 ×3 IMPLANT
CELLS DAT CNTRL 66122 CELL SVR (MISCELLANEOUS) ×1 IMPLANT
CLOSURE WOUND 1/2 X4 (GAUZE/BANDAGES/DRESSINGS) ×2
COVER SURGICAL LIGHT HANDLE (MISCELLANEOUS) ×3 IMPLANT
DRAPE C-ARM 42X72 X-RAY (DRAPES) ×3 IMPLANT
DRAPE IMP U-DRAPE 54X76 (DRAPES) ×3 IMPLANT
DRAPE STERI IOBAN 125X83 (DRAPES) ×3 IMPLANT
DRAPE U-SHAPE 47X51 STRL (DRAPES) ×9 IMPLANT
DRSG AQUACEL AG ADV 3.5X10 (GAUZE/BANDAGES/DRESSINGS) ×3 IMPLANT
DURAPREP 26ML APPLICATOR (WOUND CARE) ×3 IMPLANT
ELECT BLADE 4.0 EZ CLEAN MEGAD (MISCELLANEOUS)
ELECT BLADE 6.5 EXT (BLADE) IMPLANT
ELECT CAUTERY BLADE 6.4 (BLADE) ×3 IMPLANT
ELECT REM PT RETURN 9FT ADLT (ELECTROSURGICAL) ×3
ELECTRODE BLDE 4.0 EZ CLN MEGD (MISCELLANEOUS) IMPLANT
ELECTRODE REM PT RTRN 9FT ADLT (ELECTROSURGICAL) ×1 IMPLANT
FACESHIELD WRAPAROUND (MASK) ×6 IMPLANT
GLOVE BIOGEL PI IND STRL 8 (GLOVE) ×2 IMPLANT
GLOVE BIOGEL PI INDICATOR 8 (GLOVE) ×4
GLOVE ECLIPSE 8.0 STRL XLNG CF (GLOVE) ×3 IMPLANT
GLOVE ORTHO TXT STRL SZ7.5 (GLOVE) ×6 IMPLANT
GOWN STRL REUS W/ TWL LRG LVL3 (GOWN DISPOSABLE) ×2 IMPLANT
GOWN STRL REUS W/ TWL XL LVL3 (GOWN DISPOSABLE) ×2 IMPLANT
GOWN STRL REUS W/TWL LRG LVL3 (GOWN DISPOSABLE) ×4
GOWN STRL REUS W/TWL XL LVL3 (GOWN DISPOSABLE) ×4
HANDPIECE INTERPULSE COAX TIP (DISPOSABLE) ×2
KIT BASIN OR (CUSTOM PROCEDURE TRAY) ×3 IMPLANT
KIT ROOM TURNOVER OR (KITS) ×3 IMPLANT
MANIFOLD NEPTUNE II (INSTRUMENTS) ×3 IMPLANT
NS IRRIG 1000ML POUR BTL (IV SOLUTION) ×3 IMPLANT
PACK TOTAL JOINT (CUSTOM PROCEDURE TRAY) ×3 IMPLANT
PACK UNIVERSAL I (CUSTOM PROCEDURE TRAY) ×3 IMPLANT
PAD ARMBOARD 7.5X6 YLW CONV (MISCELLANEOUS) ×6 IMPLANT
RTRCTR WOUND ALEXIS 18CM MED (MISCELLANEOUS) ×3
SET HNDPC FAN SPRY TIP SCT (DISPOSABLE) ×1 IMPLANT
SPONGE LAP 4X18 X RAY DECT (DISPOSABLE) IMPLANT
STRIP CLOSURE SKIN 1/2X4 (GAUZE/BANDAGES/DRESSINGS) ×4 IMPLANT
SUT ETHIBOND NAB CT1 #1 30IN (SUTURE) ×3 IMPLANT
SUT MNCRL AB 4-0 PS2 18 (SUTURE) ×3 IMPLANT
SUT VIC AB 0 CT1 27 (SUTURE) ×2
SUT VIC AB 0 CT1 27XBRD ANBCTR (SUTURE) ×1 IMPLANT
SUT VIC AB 1 CT1 27 (SUTURE) ×2
SUT VIC AB 1 CT1 27XBRD ANBCTR (SUTURE) ×1 IMPLANT
SUT VIC AB 2-0 CT1 27 (SUTURE) ×2
SUT VIC AB 2-0 CT1 TAPERPNT 27 (SUTURE) ×1 IMPLANT
TOWEL OR 17X24 6PK STRL BLUE (TOWEL DISPOSABLE) ×3 IMPLANT
TOWEL OR 17X26 10 PK STRL BLUE (TOWEL DISPOSABLE) ×3 IMPLANT
TRAY FOLEY CATH 16FRSI W/METER (SET/KITS/TRAYS/PACK) IMPLANT
WATER STERILE IRR 1000ML POUR (IV SOLUTION) ×6 IMPLANT

## 2014-09-02 NOTE — Anesthesia Procedure Notes (Signed)
Procedure Name: Intubation Date/Time: 09/02/2014 1:35 PM Performed by: Vennie Homans Pre-anesthesia Checklist: Patient identified, Timeout performed, Emergency Drugs available, Suction available and Patient being monitored Patient Re-evaluated:Patient Re-evaluated prior to inductionOxygen Delivery Method: Circle system utilized Preoxygenation: Pre-oxygenation with 100% oxygen Intubation Type: IV induction Ventilation: Mask ventilation without difficulty Laryngoscope Size: Mac and 3 Grade View: Grade II Tube size: 7.0 mm Number of attempts: 1 Airway Equipment and Method: Stylet Placement Confirmation: ETT inserted through vocal cords under direct vision,  breath sounds checked- equal and bilateral and positive ETCO2 Secured at: 22 cm Tube secured with: Tape Dental Injury: Teeth and Oropharynx as per pre-operative assessment

## 2014-09-02 NOTE — Brief Op Note (Signed)
09/02/2014  2:56 PM  PATIENT:  [name]  64 y.o. female  PRE-OPERATIVE DIAGNOSIS:  primary osteoarthritis right hip  POST-OPERATIVE DIAGNOSIS:  primary osteoarthritis right hip  PROCEDURE:  Procedure(s): RIGHT TOTAL HIP ARTHROPLASTY ANTERIOR APPROACH (Right)  SURGEON:  Surgeon(s) and Role:    * Mcarthur Rossetti, MD - Primary  PHYSICIAN ASSISTANT: Benita Stabile, PA-C  ANESTHESIA:   general  EBL:  Total I/O In: 1500 [I.V.:1500] Out: 409 [Urine:250; Blood:375]  BLOOD ADMINISTERED:none  DRAINS: none   LOCAL MEDICATIONS USED:  NONE  SPECIMEN:  No Specimen  DISPOSITION OF SPECIMEN:  N/A  COUNTS:  YES  TOURNIQUET:  * No tourniquets in log *  DICTATION: .Other Dictation: Dictation Number [id number]  PLAN OF CARE: Admit to inpatient   PATIENT DISPOSITION:  PACU - hemodynamically stable.   Delay start of Pharmacological VTE agent (>24hrs) due to surgical blood loss or risk of bleeding: no

## 2014-09-02 NOTE — Plan of Care (Signed)
Problem: Consults Goal: Diagnosis- Total Joint Replacement Outcome: Completed/Met Date Met:  09/02/14 Primary Total Hip right

## 2014-09-02 NOTE — H&P (Signed)
TOTAL HIP ADMISSION H&P  Patient is admitted for right total hip arthroplasty.  Subjective:  Chief Complaint: right hip pain  HPI: [name], 64 y.o. female, has a history of pain and functional disability in the right hip(s) due to arthritis and patient has failed non-surgical conservative treatments for greater than 12 weeks to include NSAID's and/or analgesics, corticosteriod injections, flexibility and strengthening excercises, supervised PT with diminished ADL's post treatment, use of assistive devices, weight reduction as appropriate and activity modification.  Onset of symptoms was gradual starting 3 years ago with gradually worsening course since that time.The patient noted no past surgery on the right hip(s).  Patient currently rates pain in the right hip at 10 out of 10 with activity. Patient has night pain, worsening of pain with activity and weight bearing, trendelenberg gait, pain that interfers with activities of daily living, pain with passive range of motion and crepitus. Patient has evidence of subchondral sclerosis, periarticular osteophytes and joint space narrowing by imaging studies. This condition presents safety issues increasing the risk of falls.  There is no current active infection.  Patient Active Problem List   Diagnosis Date Noted  . Osteoarthritis of right hip 09/02/2014  . Contracture of right hip 07/08/2014  . Osteoarthritis of left hip 06/17/2014  . Status post total replacement of left hip 06/17/2014  . Contracture of left hip 06/03/2014  . Preoperative cardiovascular examination 04/22/2014  . PAF (paroxysmal atrial fibrillation) 04/22/2014  . Long term current use of antiarrhythmic drug 04/22/2014  . History of uterine cancer 04/22/2014  . Osteoarthritis, hip, bilateral 03/25/2014  . Lumbar and sacral osteoarthritis 12/10/2013   Past Medical History  Diagnosis Date  . Swelling of both lower extremities   . Atrial fibrillation   . Melanoma   . Skin  cancer   . Scoliosis   . Difficult intubation     pt states that her neck needs to be in a neutral position,  scoliosis  . PONV (postoperative nausea and vomiting)   . Family history of adverse reaction to anesthesia     nausea  . Dysrhythmia     dr Debara Pickett; A. Fib  . Peripheral vascular disease   . Thyroid nodule   . Urinary frequency   . Urinary incontinence   . GERD (gastroesophageal reflux disease)     protonix  . Arthritis     Past Surgical History  Procedure Laterality Date  . Abdominal hysterectomy  09/10/2013  . Melanoma excision  12/2011  . Vein surgery  05/2011    venous ablation   . Bunionectomy  10/1976  . Fracture surgery  09/1964  . Total hip arthroplasty Left 06/17/2014    Procedure: LEFT TOTAL HIP ARTHROPLASTY ANTERIOR APPROACH;  Surgeon: Mcarthur Rossetti, MD;  Location: [place];  Service: Orthopedics;  Laterality: Left;  . Colonoscopy      Prescriptions prior to admission  Medication Sig Dispense Refill Last Dose  . aspirin EC 325 MG EC tablet Take 1 tablet (325 mg total) by mouth 2 (two) times daily after a meal. (Patient taking differently: Take 325 mg by mouth daily. ) 30 tablet 0 08/28/14  . Calcium Carb-Cholecalciferol (CALCIUM-VITAMIN D) 600-400 MG-UNIT TABS Take 1 tablet by mouth 2 (two) times daily.   08/28/14  . diltiazem (TIAZAC) 240 MG 24 hr capsule Take 1 capsule (240 mg total) by mouth daily. 90 capsule 3 09/02/2014 at Unknown time  . flecainide (TAMBOCOR) 50 MG tablet Take 1.5 tablets (75 mg total) by  mouth 2 (two) times daily. 270 tablet 3 09/02/2014 at Unknown time  . ibuprofen (ADVIL,MOTRIN) 200 MG tablet Take 200 mg by mouth every 6 (six) hours as needed for mild pain or moderate pain.   08/28/14  . pantoprazole (PROTONIX) 40 MG tablet Take 40 mg by mouth daily.   1 09/02/2014 at Unknown time  . senna (SENOKOT) 8.6 MG tablet Take 1 tablet by mouth daily.   09/01/2014 at Unknown time  . spironolactone (ALDACTONE) 50 MG tablet Take 50-100 mg by mouth 2  (two) times daily. 100 mg am and 50 mg pm   09/01/2014 at Unknown time  . traMADol (ULTRAM) 50 MG tablet Take 1 tablet (50 mg total) by mouth 3 (three) times daily as needed (Take 1 tablet every 8 hours as needed for pain). 90 tablet 1 09/02/2014 at Unknown time  . triamcinolone cream (KENALOG) 0.1 % Apply 1 application topically as needed (for dry skin).   1 Past Month at Unknown time   Allergies  Allergen Reactions  . Hydrolyzed Silk Hives and Other (See Comments)    Silk tape-blisters  . Gluten Meal Other (See Comments)    reflux    History  Substance Use Topics  . Smoking status: Never Smoker   . Smokeless tobacco: Never Used  . Alcohol Use: 0.0 oz/week    0 Standard drinks or equivalent per week     Comment: rarely    Family History  Problem Relation Age of Onset  . Uterine cancer Mother   . Diabetes Brother 63  . Hypertension Father 49  . Hyperlipidemia Father   . Cancer Father 46  . Epilepsy Father 61  . Arthritis Sister 50  . Cancer Maternal Grandmother   . Stroke Paternal Grandfather   . Arthritis Sister      Review of Systems  Musculoskeletal: Positive for joint pain.  All other systems reviewed and are negative.   Objective:  Physical Exam  Constitutional: She is oriented to person, place, and time. She appears well-developed and well-nourished.  HENT:  Head: Normocephalic.  Eyes: Pupils are equal, round, and reactive to light.  Neck: Normal range of motion. Neck supple.  Cardiovascular: Normal rate.   Respiratory: Effort normal and breath sounds normal.  GI: Soft. Bowel sounds are normal.  Musculoskeletal:       Right hip: She exhibits decreased range of motion, decreased strength, tenderness and bony tenderness.  Neurological: She is alert and oriented to person, place, and time.  Skin: Skin is warm and dry.  Psychiatric: She has a normal mood and affect.    Vital signs in last 24 hours: Temp:  [97.7 F (36.5 C)] 97.7 F (36.5 C) (06/21  1040) Pulse Rate:  [81] 81 (06/21 1040) Resp:  [20] 20 (06/21 1040) BP: (149)/(76) 149/76 mmHg (06/21 1040) SpO2:  [97 %] 97 % (06/21 1040) Weight:  [101.606 kg (224 lb)] 101.606 kg (224 lb) (06/21 1040)  Labs:   Estimated body mass index is 31.68 kg/(m^2) as calculated from the following:   Height as of 08/28/14: 5' 10.5" (1.791 m).   Weight as of this encounter: 101.606 kg (224 lb).   Imaging Review Plain radiographs demonstrate severe degenerative joint disease of the right hip(s). The bone quality appears to be good for age and reported activity level.  Assessment/Plan:  End stage arthritis, right hip(s)  The patient history, physical examination, clinical judgement of the provider and imaging studies are consistent with end stage degenerative joint disease of  the right hip(s) and total hip arthroplasty is deemed medically necessary. The treatment options including medical management, injection therapy, arthroscopy and arthroplasty were discussed at length. The risks and benefits of total hip arthroplasty were presented and reviewed. The risks due to aseptic loosening, infection, stiffness, dislocation/subluxation,  thromboembolic complications and other imponderables were discussed.  The patient acknowledged the explanation, agreed to proceed with the plan and consent was signed. Patient is being admitted for inpatient treatment for surgery, pain control, PT, OT, prophylactic antibiotics, VTE prophylaxis, progressive ambulation and ADL's and discharge planning.The patient is planning to be discharged home with home health services

## 2014-09-02 NOTE — Anesthesia Preprocedure Evaluation (Addendum)
Anesthesia Evaluation  Patient identified by MRN, date of birth, ID band Patient awake    Reviewed: Allergy & Precautions, NPO status , Patient's Chart, lab work & pertinent test results  Airway Mallampati: II  TM Distance: >3 FB Neck ROM: Full    Dental  (+) Teeth Intact, Dental Advisory Given   Pulmonary  breath sounds clear to auscultation        Cardiovascular Rhythm:Regular Rate:Normal     Neuro/Psych    GI/Hepatic   Endo/Other    Renal/GU      Musculoskeletal   Abdominal   Peds  Hematology   Anesthesia Other Findings   Reproductive/Obstetrics                             Anesthesia Physical Anesthesia Plan  ASA: II  Anesthesia Plan: General   Post-op Pain Management:    Induction: Intravenous  Airway Management Planned: Oral ETT  Additional Equipment:   Intra-op Plan:   Post-operative Plan: Extubation in OR  Informed Consent: I have reviewed the patients History and Physical, chart, labs and discussed the procedure including the risks, benefits and alternatives for the proposed anesthesia with the patient or authorized representative who has indicated his/her understanding and acceptance.   Dental advisory given  Plan Discussed with: CRNA and Anesthesiologist  Anesthesia Plan Comments: (H/O Paroxysmal Afib now in SR GERD H/O post-op nausea and vomiting  Plan GA with oral ETT  Roberts Gaudy)       Anesthesia Quick Evaluation

## 2014-09-02 NOTE — Transfer of Care (Signed)
Immediate Anesthesia Transfer of Care Note  Patient: [name]  Procedure(s) Performed: Procedure(s): RIGHT TOTAL HIP ARTHROPLASTY ANTERIOR APPROACH (Right)  Patient Location: PACU  Anesthesia Type:General  Level of Consciousness: awake, alert , oriented, patient cooperative and responds to stimulation  Airway & Oxygen Therapy: Patient Spontanous Breathing and Patient connected to nasal cannula oxygen  Post-op Assessment: Report given to RN, Post -op Vital signs reviewed and stable, Patient moving all extremities X 4 and Patient able to stick tongue midline  Post vital signs: stable  Last Vitals:  Filed Vitals:   09/02/14 1040  BP: 149/76  Pulse: 81  Temp: 36.5 C  Resp: 20    Complications: No apparent anesthesia complications

## 2014-09-02 NOTE — Plan of Care (Signed)
Problem: Consults Goal: Diagnosis- Total Joint Replacement Primary Total Hip Right     

## 2014-09-02 NOTE — Anesthesia Postprocedure Evaluation (Signed)
  Anesthesia Post-op Note  Patient: [name]  Procedure(s) Performed: Procedure(s): RIGHT TOTAL HIP ARTHROPLASTY ANTERIOR APPROACH (Right)  Patient Location: PACU  Anesthesia Type:General  Level of Consciousness: awake, alert  and oriented  Airway and Oxygen Therapy: Patient Spontanous Breathing and Patient connected to nasal cannula oxygen  Post-op Pain: mild  Post-op Assessment: Post-op Vital signs reviewed, Patient's Cardiovascular Status Stable, Respiratory Function Stable, Patent Airway and Pain level controlled              Post-op Vital Signs: stable  Last Vitals:  Filed Vitals:   09/02/14 1545  BP: 120/50  Pulse: 67  Temp:   Resp: 10    Complications: No apparent anesthesia complications

## 2014-09-02 NOTE — Progress Notes (Signed)
Utilization review completed.  

## 2014-09-03 ENCOUNTER — Encounter (HOSPITAL_COMMUNITY): Payer: Self-pay | Admitting: Orthopaedic Surgery

## 2014-09-03 LAB — BASIC METABOLIC PANEL
Anion gap: 8 (ref 5–15)
BUN: 16 mg/dL (ref 6–20)
CHLORIDE: 97 mmol/L — AB (ref 101–111)
CO2: 29 mmol/L (ref 22–32)
Calcium: 8.8 mg/dL — ABNORMAL LOW (ref 8.9–10.3)
Creatinine, Ser: 0.61 mg/dL (ref 0.44–1.00)
GFR calc Af Amer: >60 mL/min (ref >60)
GFR calc non Af Amer: >60 mL/min (ref >60)
GLUCOSE: 133 mg/dL — AB (ref 65–99)
POTASSIUM: 5.1 mmol/L (ref 3.5–5.1)
SODIUM: 134 mmol/L — AB (ref 135–145)

## 2014-09-03 LAB — CBC
HEMATOCRIT: 35.5 % — AB (ref 36.0–46.0)
HEMOGLOBIN: 12.2 g/dL (ref 12.0–15.0)
MCH: 34.4 pg — ABNORMAL HIGH (ref 26.0–34.0)
MCHC: 34.4 g/dL (ref 30.0–36.0)
MCV: 100 fL (ref 78.0–100.0)
PLATELETS: 213 10*3/uL (ref 150–400)
RBC: 3.55 MIL/uL — ABNORMAL LOW (ref 3.87–5.11)
RDW: 11.8 % (ref 11.5–15.5)
WBC: 10.5 10*3/uL (ref 4.0–10.5)

## 2014-09-03 NOTE — Discharge Instructions (Signed)
INSTRUCTIONS AFTER JOINT REPLACEMENT  ° °o Remove items at home which could result in a fall. This includes throw rugs or furniture in walking pathways °o ICE to the affected joint every three hours while awake for 30 minutes at a time, for at least the first 3-5 days, and then as needed for pain and swelling.  Continue to use ice for pain and swelling. You may notice swelling that will progress down to the foot and ankle.  This is normal after surgery.  Elevate your leg when you are not up walking on it.   °o Continue to use the breathing machine you got in the hospital (incentive spirometer) which will help keep your temperature down.  It is common for your temperature to cycle up and down following surgery, especially at night when you are not up moving around and exerting yourself.  The breathing machine keeps your lungs expanded and your temperature down. ° ° °DIET:  As you were doing prior to hospitalization, we recommend a well-balanced diet. ° °DRESSING / WOUND CARE / SHOWERING ° °Keep the surgical dressing until follow up.  The dressing is water proof, so you can shower without any extra covering.  IF THE DRESSING FALLS OFF or the wound gets wet inside, change the dressing with sterile gauze.  Please use good hand washing techniques before changing the dressing.  Do not use any lotions or creams on the incision until instructed by your surgeon.   ° °ACTIVITY ° °o Increase activity slowly as tolerated, but follow the weight bearing instructions below.   °o No driving for 6 weeks or until further direction given by your physician.  You cannot drive while taking narcotics.  °o No lifting or carrying greater than 10 lbs. until further directed by your surgeon. °o Avoid periods of inactivity such as sitting longer than an hour when not asleep. This helps prevent blood clots.  °o You may return to work once you are authorized by your doctor.  ° ° ° °WEIGHT BEARING  ° °Weight bearing as tolerated with assist  device (walker, cane, etc) as directed, use it as long as suggested by your surgeon or therapist, typically at least 4-6 weeks. ° ° °EXERCISES ° °Results after joint replacement surgery are often greatly improved when you follow the exercise, range of motion and muscle strengthening exercises prescribed by your doctor. Safety measures are also important to protect the joint from further injury. Any time any of these exercises cause you to have increased pain or swelling, decrease what you are doing until you are comfortable again and then slowly increase them. If you have problems or questions, call your caregiver or physical therapist for advice.  ° °Rehabilitation is important following a joint replacement. After just a few days of immobilization, the muscles of the leg can become weakened and shrink (atrophy).  These exercises are designed to build up the tone and strength of the thigh and leg muscles and to improve motion. Often times heat used for twenty to thirty minutes before working out will loosen up your tissues and help with improving the range of motion but do not use heat for the first two weeks following surgery (sometimes heat can increase post-operative swelling).  ° °These exercises can be done on a training (exercise) mat, on the floor, on a table or on a bed. Use whatever works the best and is most comfortable for you.    Use music or television while you are exercising so that   the exercises are a pleasant break in your day. This will make your life better with the exercises acting as a break in your routine that you can look forward to.   Perform all exercises about fifteen times, three times per day or as directed.  You should exercise both the operative leg and the other leg as well. ° °Exercises include: °  °• Quad Sets - Tighten up the muscle on the front of the thigh (Quad) and hold for 5-10 seconds.   °• Straight Leg Raises - With your knee straight (if you were given a brace, keep it on),  lift the leg to 60 degrees, hold for 3 seconds, and slowly lower the leg.  Perform this exercise against resistance later as your leg gets stronger.  °• Leg Slides: Lying on your back, slowly slide your foot toward your buttocks, bending your knee up off the floor (only go as far as is comfortable). Then slowly slide your foot back down until your leg is flat on the floor again.  °• Angel Wings: Lying on your back spread your legs to the side as far apart as you can without causing discomfort.  °• Hamstring Strength:  Lying on your back, push your heel against the floor with your leg straight by tightening up the muscles of your buttocks.  Repeat, but this time bend your knee to a comfortable angle, and push your heel against the floor.  You may put a pillow under the heel to make it more comfortable if necessary.  ° °A rehabilitation program following joint replacement surgery can speed recovery and prevent re-injury in the future due to weakened muscles. Contact your doctor or a physical therapist for more information on knee rehabilitation.  ° ° °CONSTIPATION ° °Constipation is defined medically as fewer than three stools per week and severe constipation as less than one stool per week.  Even if you have a regular bowel pattern at home, your normal regimen is likely to be disrupted due to multiple reasons following surgery.  Combination of anesthesia, postoperative narcotics, change in appetite and fluid intake all can affect your bowels.  ° °YOU MUST use at least one of the following options; they are listed in order of increasing strength to get the job done.  They are all available over the counter, and you may need to use some, POSSIBLY even all of these options:   ° °Drink plenty of fluids (prune juice may be helpful) and high fiber foods °Colace 100 mg by mouth twice a day  °Senokot for constipation as directed and as needed Dulcolax (bisacodyl), take with full glass of water  °Miralax (polyethylene glycol)  once or twice a day as needed. ° °If you have tried all these things and are unable to have a bowel movement in the first 3-4 days after surgery call either your surgeon or your primary doctor.   ° °If you experience loose stools or diarrhea, hold the medications until you stool forms back up.  If your symptoms do not get better within 1 week or if they get worse, check with your doctor.  If you experience "the worst abdominal pain ever" or develop nausea or vomiting, please contact the office immediately for further recommendations for treatment. ° ° °ITCHING:  If you experience itching with your medications, try taking only a single pain pill, or even half a pain pill at a time.  You can also use Benadryl over the counter for itching or also to   help with sleep.   TED HOSE STOCKINGS:  Use stockings on both legs until for at least 2 weeks or as directed by physician office. They may be removed at night for sleeping.  MEDICATIONS:  See your medication summary on the After Visit Summary that nursing will review with you.  You may have some home medications which will be placed on hold until you complete the course of blood thinner medication.  It is important for you to complete the blood thinner medication as prescribed.  PRECAUTIONS:  If you experience chest pain or shortness of breath - call 911 immediately for transfer to the hospital emergency department.   If you develop a fever greater that 101 F, purulent drainage from wound, increased redness or drainage from wound, foul odor from the wound/dressing, or calf pain - CONTACT YOUR SURGEON.                                                   FOLLOW-UP APPOINTMENTS:  If you do not already have a post-op appointment, please call the office for an appointment to be seen by your surgeon.  Guidelines for how soon to be seen are listed in your After Visit Summary, but are typically between 1-4 weeks after surgery.  OTHER INSTRUCTIONS:   Knee  Replacement:  Do not place pillow under knee, focus on keeping the knee straight while resting. CPM instructions: 0-90 degrees, 2 hours in the morning, 2 hours in the afternoon, and 2 hours in the evening. Place foam block, curve side up under heel at all times except when in CPM or when walking.  DO NOT modify, tear, cut, or change the foam block in any way.  MAKE SURE YOU:   Understand these instructions.   Get help right away if you are not doing well or get worse.    Thank you for letting us be a part of your medical care team.  It is a privilege we respect greatly.  We hope these instructions will help you stay on track for a fast and full recovery!    DO GET AN OVER THE COUNTER STOOL SOFTENER TO TAKE TWICE DAILY WHILE ON PAIN MEDS

## 2014-09-03 NOTE — Progress Notes (Signed)
Occupational Therapy Evaluation Patient Details Name: [name] MRN: [id number] DOB: 01/15/1951 Today's Date: 09/03/2014    History of Present Illness Patient is a 64 yo female admitted 6/21 for elective R THA direct approach. Pt with h/o a-fib.   Clinical Impression   Pt admitted with the above diagnoses and presents with below problem list. Pt will benefit from continued acute OT to address the below listed deficits and maximize independence with BADLs prior to d/c home. PTA pt was mod I with ADLs. Pt is currently min guard to supervision level for LB ADLs. ADLs completed and education provided as detailed below.      Follow Up Recommendations  No OT follow up;Supervision - Intermittent;Other (comment) (OOB/mobility)    Equipment Recommendations  None recommended by OT    Recommendations for Other Services       Precautions / Restrictions Precautions Precautions: None Restrictions Weight Bearing Restrictions: Yes RLE Weight Bearing: Weight bearing as tolerated      Mobility Bed Mobility Overal bed mobility: Modified Independent                Transfers Overall transfer level: Needs assistance Equipment used: Rolling walker (2 wheeled) Transfers: Sit to/from Stand Sit to Stand: Supervision         General transfer comment: supervision for safety    Balance Overall balance assessment: Needs assistance         Standing balance support: Bilateral upper extremity supported;During functional activity Standing balance-Leahy Scale: Fair Standing balance comment: able to wash hands standing at sink                            ADL Overall ADL's : Needs assistance/impaired Eating/Feeding: Set up;Sitting   Grooming: Set up;Standing;Sitting;Supervision/safety Grooming Details (indicate cue type and reason): Washed hands in standing. Upper Body Bathing: Set up;Sitting   Lower Body Bathing: Sit to/from stand;With adaptive equipment;Supervison/  safety   Upper Body Dressing : Set up;Sitting   Lower Body Dressing: With adaptive equipment;Sit to/from stand;Supervision/safety   Toilet Transfer: Supervision/safety;Ambulation;BSC;RW   Toileting- Clothing Manipulation and Hygiene: Supervision/safety;Sit to/from stand   Tub/ Shower Transfer: Supervision/safety;Ambulation;3 in 1;Rolling walker   Functional mobility during ADLs: Supervision/safety;Rolling walker General ADL Comments: Pt with good recall of AE and compensatory strategies from prior hip surgery in April 2016. Pt completed bed mobility at mod I level and ambulated to toilet transfer at [place] level. Reviewed ADL education and home setup     Vision     Perception     Praxis      Pertinent Vitals/Pain Pain Assessment: 0-10 Pain Score: 2  Pain Location: Rt hip at rest. Pain Descriptors / Indicators: Sore Pain Intervention(s): Monitored during session;Repositioned     Hand Dominance     Extremity/Trunk Assessment Upper Extremity Assessment Upper Extremity Assessment: Overall WFL for tasks assessed   Lower Extremity Assessment Lower Extremity Assessment: Defer to PT evaluation   Cervical / Trunk Assessment Cervical / Trunk Assessment: Normal   Communication Communication Communication: No difficulties   Cognition Arousal/Alertness: Awake/alert Behavior During Therapy: WFL for tasks assessed/performed Overall Cognitive Status: Within Functional Limits for tasks assessed                     General Comments       Exercises       Shoulder Instructions      Home Living Family/patient expects to be discharged to:: Private residence Living Arrangements:  Alone Available Help at Discharge: Family;Available PRN/intermittently Type of Home: Apartment Home Access: Level entry     Home Layout: One level     Bathroom Shower/Tub: Teacher, early years/pre: Standard     Home Equipment: Grab bars - tub/shower;Walker - 2  wheels;Bedside commode;Tub bench   Additional Comments: Pt very active PTA. Recently moved from [place]. USe to walk 45 mins each morning.       Prior Functioning/Environment Level of Independence: Independent with assistive device(s)        Comments: Pt reports use of cane.    OT Diagnosis: Acute pain   OT Problem List: Impaired balance (sitting and/or standing);Decreased knowledge of use of DME or AE;Decreased knowledge of precautions;Pain   OT Treatment/Interventions: Self-care/ADL training;DME and/or AE instruction;Therapeutic activities;Patient/family education;Balance training    OT Goals(Current goals can be found in the care plan section) Acute Rehab OT Goals Patient Stated Goal: home OT Goal Formulation: With patient Time For Goal Achievement: 09/10/14 Potential to Achieve Goals: Good ADL Goals Pt Will Perform Lower Body Bathing: with modified independence;sit to/from stand;with adaptive equipment Pt Will Perform Lower Body Dressing: with modified independence;with adaptive equipment;sit to/from stand Pt Will Perform Tub/Shower Transfer: Shower transfer;ambulating;3 in 1;rolling walker;with modified independence  OT Frequency: Min 1X/week   Barriers to D/C:            Co-evaluation              End of Session Equipment Utilized During Treatment: Gait belt;Rolling walker  Activity Tolerance: Patient tolerated treatment well Patient left: in chair;with call bell/phone within reach   Time: 1112-1133 OT Time Calculation (min): 21 min Charges:  OT General Charges $OT Visit: 1 Procedure OT Evaluation $Initial OT Evaluation Tier I: 1 Procedure G-Codes:    Hortencia Pilar 2014/09/21, 12:24 PM

## 2014-09-03 NOTE — Progress Notes (Signed)
Physical Therapy Treatment Note  Clinical Impression: Pt much improved from AM session. Pt able to ambulate around entire floor without rest break. Pt with n/v. Acute PT to con't to follow to progress independence with mobility to prep for d/c home alone.    09/03/14 1430  PT Visit Information  Last PT Received On 09/03/14  Assistance Needed +1  History of Present Illness Patient is a 64 yo female admitted 6/21 for elective R THA direct approach. Pt with h/o a-fib.  PT Time Calculation  PT Start Time (ACUTE ONLY) 1430  PT Stop Time (ACUTE ONLY) 1500  PT Time Calculation (min) (ACUTE ONLY) 30 min  Subjective Data  Subjective pt received supine in bed with report of minimal nausea  Patient Stated Goal home  Precautions  Precautions None  Restrictions  Weight Bearing Restrictions Yes  RLE Weight Bearing WBAT  Pain Assessment  Pain Assessment 0-10  Pain Score 3  Pain Location R hip  Pain Descriptors / Indicators Sore  Pain Intervention(s) Monitored during session  Cognition  Arousal/Alertness Awake/alert  Behavior During Therapy WFL for tasks assessed/performed  Overall Cognitive Status Within Functional Limits for tasks assessed  Bed Mobility  Overal bed mobility Modified Independent  General bed mobility comments HOB elevated  Transfers  Overall transfer level Needs assistance  Equipment used Rolling walker (2 wheeled)  Transfers Sit to/from Stand  Sit to Stand Supervision  General transfer comment demo'd good technique  Ambulation/Gait  Ambulation/Gait assistance Min guard  Ambulation Distance (Feet) 600 Feet  Assistive device Rolling walker (2 wheeled)  General Gait Details step through, as duration increased pt with increased bilat UE WBing, v/c's to increased bilat LE WBing  Gait Pattern/deviations Step-through pattern  Gait velocity decreased  Gait velocity interpretation Below normal speed for age/gender  Exercises  Exercises Total Joint;Other exercises  Total  Joint Exercises  Long Arc Quad AROM;Right;10 reps;Seated  Marching in Standing AROM;Right;10 reps;Standing  Other Exercises  Other Exercises mini squats x 10 reps  PT - End of Session  Equipment Utilized During Treatment Gait belt;Oxygen  Activity Tolerance Patient tolerated treatment well  Patient left in chair;with call bell/phone within reach  Nurse Communication Mobility status  PT - Assessment/Plan  PT Plan Current plan remains appropriate  PT Frequency (ACUTE ONLY) 7X/week  Follow Up Recommendations Home health PT;Supervision - Intermittent  PT equipment None recommended by PT  PT Goal Progression  Progress towards PT goals Progressing toward goals  PT General Charges  $$ ACUTE PT VISIT 1 Procedure  PT Treatments  $Gait Training 8-22 mins  $Therapeutic Exercise 8-22 mins    Kittie Plater, PT, DPT Pager #: [phone] Office #: [phone]

## 2014-09-03 NOTE — Evaluation (Signed)
Physical Therapy Evaluation Patient Details Name: [name] MRN: [id number] DOB: December 08, 1950 Today's Date: 09/03/2014   History of Present Illness  Patient is a 64 yo female admitted 6/21 for elective R THA direct approach. Pt with h/o a-fib.  Clinical Impression  Pt is s/p THA resulting in the deficits listed below (see PT Problem List). Pt with minimal pain but had + vomiting limiting ambulation tolerance this date.  Pt will benefit from skilled PT to increase their independence and safety with mobility to allow discharge to the venue listed below.      Follow Up Recommendations Home health PT;Supervision - Intermittent    Equipment Recommendations  None recommended by PT    Recommendations for Other Services       Precautions / Restrictions Precautions Precautions: None Precaution Comments: +nausea/vomitting during ambulation Restrictions Weight Bearing Restrictions: Yes RLE Weight Bearing: Weight bearing as tolerated      Mobility  Bed Mobility Overal bed mobility: Modified Independent             General bed mobility comments: used bed rail and HOB elevated. sat up impulsively due to having urgently having to vomit  Transfers Overall transfer level: Needs assistance Equipment used: Rolling walker (2 wheeled) Transfers: Sit to/from Stand Sit to Stand: Supervision         General transfer comment: v/c's for safe hand placement  Ambulation/Gait Ambulation/Gait assistance: Min guard Ambulation Distance (Feet): 30 Feet Assistive device: Rolling walker (2 wheeled) Gait Pattern/deviations: Step-through pattern;Decreased stride length;Antalgic Gait velocity: decreased Gait velocity interpretation: Below normal speed for age/gender General Gait Details: limited by onset of nausea and vomitting  Stairs            Wheelchair Mobility    Modified Rankin (Stroke Patients Only)       Balance Overall balance assessment: No apparent balance deficits  (not formally assessed) (need RW for ambulation as expected)                                           Pertinent Vitals/Pain Pain Assessment: 0-10 Pain Score: 3  Pain Location: R hip Pain Descriptors / Indicators: Sore    Home Living Family/patient expects to be discharged to:: Private residence Living Arrangements: Alone Available Help at Discharge: Family;Available PRN/intermittently Type of Home: Apartment Home Access: Level entry     Home Layout: One level Home Equipment: Grab bars - tub/shower;Walker - 2 wheels;Bedside commode Additional Comments: Pt very active PTA. Recently moved from [place]. USe to walk 45 mins each morning.     Prior Function Level of Independence: Independent               Hand Dominance        Extremity/Trunk Assessment   Upper Extremity Assessment: Overall WFL for tasks assessed           Lower Extremity Assessment: RLE deficits/detail RLE Deficits / Details: able to initiate R hip flexion    Cervical / Trunk Assessment: Normal  Communication   Communication: No difficulties  Cognition Arousal/Alertness: Awake/alert Behavior During Therapy: WFL for tasks assessed/performed Overall Cognitive Status: Within Functional Limits for tasks assessed                      General Comments      Exercises Total Joint Exercises Ankle Circles/Pumps: AROM;Both;10 reps;Seated Heel Slides: AROM;Right;10 reps;Supine Long Arc  Quad: AROM;Right;10 reps;Seated      Assessment/Plan    PT Assessment Patient needs continued PT services  PT Diagnosis Difficulty walking;Generalized weakness   PT Problem List Decreased strength;Decreased range of motion;Decreased activity tolerance;Decreased balance;Decreased mobility  PT Treatment Interventions Gait training;Functional mobility training;Therapeutic activities;Therapeutic exercise   PT Goals (Current goals can be found in the Care Plan section) Acute Rehab PT  Goals Patient Stated Goal: home PT Goal Formulation: With patient Time For Goal Achievement: 09/10/14 Potential to Achieve Goals: Good    Frequency 7X/week   Barriers to discharge Decreased caregiver support alone    Co-evaluation               End of Session Equipment Utilized During Treatment: Gait belt;Oxygen (2Lo2 via [place] to help with nausea) Activity Tolerance:  (limited by + n/v) Patient left: in chair;with call bell/phone within reach;with nursing/sitter in room Nurse Communication: Mobility status (need for anti-nausea meds)         Time: 0722-0757 PT Time Calculation (min) (ACUTE ONLY): 35 min   Charges:   PT Evaluation $Initial PT Evaluation Tier I: 1 Procedure PT Treatments $Gait Training: 8-22 mins   PT G CodesKingsley Callander 09/03/2014, 8:14 AM  Kittie Plater, PT, DPT Pager #: [phone] Office #: [phone]

## 2014-09-03 NOTE — Progress Notes (Signed)
Subjective: 1 Day Post-Op Procedure(s) (LRB): RIGHT TOTAL HIP ARTHROPLASTY ANTERIOR APPROACH (Right) Patient reports pain as moderate.  lABS NOT BACK YET.  Objective: Vital signs in last 24 hours: Temp:  [97.4 F (36.3 C)-97.8 F (36.6 C)] 97.5 F (36.4 C) (06/22 0603) Pulse Rate:  [62-81] 76 (06/22 0603) Resp:  [10-23] 16 (06/22 0603) BP: (108-149)/(50-114) 110/58 mmHg (06/22 0603) SpO2:  [92 %-100 %] 99 % (06/22 0603) Weight:  [101.606 kg (224 lb)] 101.606 kg (224 lb) (06/21 1040)  Intake/Output from previous day: 06/21 0701 - 06/22 0700 In: 2962.5 [P.O.:120; I.V.:2692.5; IV Piggyback:150] Out: 625 [Urine:250; Blood:375] Intake/Output this shift:    No results for input(s): HGB in the last 72 hours. No results for input(s): WBC, RBC, HCT, PLT in the last 72 hours. No results for input(s): NA, K, CL, CO2, BUN, CREATININE, GLUCOSE, CALCIUM in the last 72 hours. No results for input(s): LABPT, INR in the last 72 hours.  Sensation intact distally Intact pulses distally Dorsiflexion/Plantar flexion intact Incision: scant drainage  Assessment/Plan: 1 Day Post-Op Procedure(s) (LRB): RIGHT TOTAL HIP ARTHROPLASTY ANTERIOR APPROACH (Right) Up with therapy  [name] Y 09/03/2014, 7:10 AM

## 2014-09-03 NOTE — Op Note (Signed)
NAMERHAPSODY, WOLVEN NO.:  [id number]  MEDICAL RECORD NO.:  [id number]  LOCATION:  [id number]                        FACILITY:  [facility]  PHYSICIAN:  Lind Guest. Ninfa Linden, M.D.DATE OF BIRTH:  01/02/1951  DATE OF PROCEDURE:  09/02/2014 DATE OF DISCHARGE:                              OPERATIVE REPORT   PREOPERATIVE DIAGNOSES:  Primary osteoarthritis and degenerative joint disease, right hip.  POSTOPERATIVE DIAGNOSES:  Primary osteoarthritis and degenerative joint disease, right hip.  PROCEDURE:  Right total hip arthroplasty through direct anterior approach.  IMPLANTS:  DePuy Sector Gription acetabular component size 54 with a single screw, size 36+ 4 neutral polyethylene liner, size 14 Corail femoral component with varus offset (KLA), size 36+ 1.5 ceramic hip ball.  SURGEON:  Lind Guest. Ninfa Linden, M.D.  ASSISTANT:  [name], PA-C  ANESTHESIA:  General.  BLOOD LOSS:  375-400 mL.  ANTIBIOTICS:  2 g of IV Ancef.  COMPLICATIONS:  None.  INDICATIONS:  Ms. Siegenthaler is a 64 year old female with bilateral hip osteoarthritis.  She underwent a successful left total hip arthroplasty through direct anterior approach in just April of this year.  Now, two months later, she presents to have the right hip replaced.  She has known debilitating arthritis involving her right hip.  She has periarticular osteophytes on x-ray, severe joint space narrowing and sclerotic changes.  Her pain is daily, her mobility is limited, this has affected her quality of life and her activities of daily living.  Her left hip has been very successful for her and now she wished to proceed with the right hip.  She understands the risks of acute blood loss anemia, nerve and vessel injury, fracture, infection, dislocation and DVT.  She did have a bout of postoperative atrial fibrillation after the first case and she is on medications for this now.  She does wish to proceed with Surgery  given the benefits of decreased pain, improved mobility, and overall improved quality of life.  PROCEDURE DESCRIPTION:  After informed consent was obtained and also obtained cardiac clearance, she was brought to the operating room and general anesthesia was obtained while she was on a stretcher.  A Foley catheter was placed and then both feet had traction boots applied to them.  Next, she was placed supine on the Hana fracture table with the perineal post in place and both legs in inline skeletal traction devices, but no traction applied.  Her right operative hip was then prepped and draped with DuraPrep and sterile drapes.  A time-out was called and she was identified as correct patient and correct right hip. We then made an incision inferior and posterior to the anterior superior iliac spine and carried this obliquely down the leg.  I dissected down to the tensor fascia lata muscle and tensor fascia was identified and divided longitudinally, so I could proceed with a direct anterior approach to the hip.  We identified and cauterized the lateral femoral circumflex vessels and then placed a Cobra retractor around the lateral neck and up underneath the rectus femoris around the medial femoral neck.  I identified the hip capsule and removed debris from the hip capsule.  I then opened up the  hip capsule in L-type format, with findings significant scarring of the hip capsule, a large joint effusion and osteophytes all around the femoral neck and head.  I removed these and actually I had to perform a capsulectomy.  I then made my femoral neck cut with an oscillating saw proximal to the lesser trochanter and completed this with an osteotome.  I placed a corkscrew guide in the femoral head and removed the femoral head in its entirety and found it to be devoid of cartilage and I found the acetabulum to be quite sclerotic.  I placed a bent Hohmann over the medial acetabular rim and then used a  #10 blade to remove remnants of the acetabular labrum.  I then began reaming under direct visualization from a size 42 reamer all the way up to the size 54 in 2-mm increments with the last reamer also under direct fluoroscopy, so I could obtain my depth of reaming, our inclination, and anteversion.  Once I was pleased with this, I placed the real DePuy Sector Gription acetabular component size 54, which actually corresponded with the other side, did the sclerotic changes in her bone.  I did place a single screw and I felt good security with fit of the cup.  I then placed a real 36+ 4 polyethylene liner.  Attention was then turned to the femur.  With the leg externally rotated to 100- 110 degrees, extended and adducted, I was able to use a Mueller retractor medially and a Hohmann retractor behind the greater trochanter.  I released the lateral joint capsule and used a box cutting osteotome to enter from the canal and a rongeur to lateralize them again broaching from a size 8 broach, using the Corail broaching system up to a size 14, which also corresponded with the other side with a tight fit of the 14.  I trialed a varus offset femoral neck trial and a 36+ 1.5 trial hip ball.  We brought the leg back over and up with traction and internal rotation reducing the pelvis.  I was pleased with the range of motion, her offset and her stability as well as her leg lengths.  This was all assessed under visualization, manipulation and fluoroscopy.  I then dislocated the hip and removed the trial components.  We placed the real Corail femoral component size 14 with varus offset and the real 36+ 1.5 ceramic hip ball, we reduced this back in the acetabulum and again, we were pleased with stability.  We then copiously irrigated the soft tissue with normal saline solution using pulsatile lavage.  We closed the iliotibial band with interrupted #1 Vicryl suture followed by 0 Vicryl in the deep tissue, 2-0  Vicryl in the subcutaneous tissue, 4-0 Monocryl subcuticular stitch and Steri-Strips on the skin.  An Aquacel dressing was also applied.  She was then taken off the Hana table, awakened, extubated and taken to the recovery room in stable condition. All final counts were correct.  There were no complications noted.  Of note, [name], PA-C assisted the entire case and his assistance was crucial for facilitating all aspects of this case.     Lind Guest. Ninfa Linden, M.D.     CYB/MEDQ  D:  09/02/2014  T:  09/03/2014  Job:  [id number]

## 2014-09-04 LAB — CBC
HCT: 30.4 % — ABNORMAL LOW (ref 36.0–46.0)
Hemoglobin: 10.4 g/dL — ABNORMAL LOW (ref 12.0–15.0)
MCH: 33.9 pg (ref 26.0–34.0)
MCHC: 34.2 g/dL (ref 30.0–36.0)
MCV: 99 fL (ref 78.0–100.0)
PLATELETS: 190 10*3/uL (ref 150–400)
RBC: 3.07 MIL/uL — ABNORMAL LOW (ref 3.87–5.11)
RDW: 12.1 % (ref 11.5–15.5)
WBC: 8.9 10*3/uL (ref 4.0–10.5)

## 2014-09-04 MED ORDER — ONDANSETRON HCL 4 MG PO TABS: 4.0000 mg | ORAL_TABLET | Freq: Four times a day (QID) | ORAL | Status: DC | PRN

## 2014-09-04 MED ORDER — OXYCODONE HCL 5 MG PO TABS: 5.0000 mg | ORAL_TABLET | ORAL | Status: DC | PRN

## 2014-09-04 MED ORDER — DOCUSATE SODIUM 100 MG PO CAPS: 100.0000 mg | ORAL_CAPSULE | Freq: Two times a day (BID) | ORAL | Status: DC

## 2014-09-04 MED ORDER — ASPIRIN 325 MG PO TBEC: 325.0000 mg | DELAYED_RELEASE_TABLET | Freq: Two times a day (BID) | ORAL | Status: DC

## 2014-09-04 MED ORDER — BISACODYL 10 MG RE SUPP: 10.0000 mg | Freq: Every day | RECTAL | Status: DC | PRN

## 2014-09-04 NOTE — Progress Notes (Signed)
Physical Therapy Treatment Patient Details Name: [name] MRN: [id number] DOB: Aug 12, 1950 Today's Date: 09/04/2014    History of Present Illness Patient is a 64 yo female admitted 6/21 for elective R THA direct approach. Pt with h/o a-fib.    PT Comments    Patient is making good progress with PT.  From a mobility standpoint anticipate patient will be ready for DC home today.  R knee flexion AROM exercises performed in standing to address tightness/aching in R thigh.  Pt will benefit from continued skilled PT services to increase functional independence and safety.     Follow Up Recommendations  Home health PT;Supervision - Intermittent     Equipment Recommendations  None recommended by PT    Recommendations for Other Services       Precautions / Restrictions Precautions Precautions: None Precaution Comments: +nausea throughout session but is better than this morning Restrictions Weight Bearing Restrictions: Yes RLE Weight Bearing: Weight bearing as tolerated    Mobility  Bed Mobility Overal bed mobility: Modified Independent             General bed mobility comments: HOB elevated, use of bed rails  Transfers Overall transfer level: Needs assistance Equipment used: Rolling walker (2 wheeled) Transfers: Sit to/from Stand Sit to Stand: Supervision         General transfer comment: Supervision for safety  Ambulation/Gait Ambulation/Gait assistance: Supervision Ambulation Distance (Feet): 250 Feet Assistive device: Rolling walker (2 wheeled) Gait Pattern/deviations: Antalgic;Trunk flexed;Decreased stance time - right Gait velocity: decreased Gait velocity interpretation: Below normal speed for age/gender General Gait Details: Supervision for safety.  Pt w/ forward trunk lean which she says is hard to correct 2/2 h/o back pain   Stairs            Wheelchair Mobility    Modified Rankin (Stroke Patients Only)       Balance Overall balance  assessment: Needs assistance Sitting-balance support: Feet supported;No upper extremity supported Sitting balance-Leahy Scale: Good     Standing balance support: Bilateral upper extremity supported;During functional activity Standing balance-Leahy Scale: Fair                      Cognition Arousal/Alertness: Awake/alert Behavior During Therapy: WFL for tasks assessed/performed Overall Cognitive Status: Within Functional Limits for tasks assessed                      Exercises Total Joint Exercises Hip ABduction/ADduction: AROM;Both;10 reps;Standing Knee Flexion: AROM;10 reps;Right;Standing Marching in Standing: AROM;Both;10 reps;Standing Other Exercises Other Exercises: mini squats x 10 reps    General Comments        Pertinent Vitals/Pain Pain Assessment: 0-10 Pain Score: 6  Pain Location: R hip and down ant thigh Pain Descriptors / Indicators: Aching Pain Intervention(s): Limited activity within patient's tolerance;Monitored during session;Repositioned    Home Living                      Prior Function            PT Goals (current goals can now be found in the care plan section) Acute Rehab PT Goals Patient Stated Goal: home PT Goal Formulation: With patient Time For Goal Achievement: 09/10/14 Potential to Achieve Goals: Good Progress towards PT goals: Progressing toward goals    Frequency  7X/week    PT Plan Current plan remains appropriate    Co-evaluation  End of Session Equipment Utilized During Treatment: Gait belt Activity Tolerance: Patient tolerated treatment well;Other (comment) (some nausea during session) Patient left: in bed;with call bell/phone within reach     Time: 7741-2878 PT Time Calculation (min) (ACUTE ONLY): 21 min  Charges:  $Gait Training: 8-22 mins $Therapeutic Exercise: 8-22 mins                    G Codes:      Joslyn Hy PT, [place] [phone] Pager: [phone] 09/04/2014, 3:32  PM

## 2014-09-04 NOTE — Progress Notes (Signed)
Physical Therapy Treatment Patient Details Name: [name] MRN: [id number] DOB: [birth year] Today's Date: 09/04/2014    History of Present Illness Patient is a 64 yo female admitted 6/21 for elective R THA direct approach. Pt with h/o a-fib.    PT Comments    Patient is making good progress with PT.  From a mobility standpoint anticipate patient will be ready for DC home today or tomorrow depending on pt's nausea.  Pt ambulated 661ft and completed therapeutic exercises in standing. Pt will benefit from continued skilled PT services to increase functional independence and safety.   Follow Up Recommendations  Home health PT;Supervision - Intermittent     Equipment Recommendations  None recommended by PT    Recommendations for Other Services       Precautions / Restrictions Precautions Precautions: None Precaution Comments: +nausea w/o vomitting after ambulation Restrictions Weight Bearing Restrictions: Yes RLE Weight Bearing: Weight bearing as tolerated    Mobility  Bed Mobility Overal bed mobility: Modified Independent             General bed mobility comments: HOB elevated, use of bed rails  Transfers Overall transfer level: Needs assistance Equipment used: Rolling walker (2 wheeled) Transfers: Sit to/from Stand Sit to Stand: Supervision         General transfer comment: Supervision for safety  Ambulation/Gait Ambulation/Gait assistance: Supervision Ambulation Distance (Feet): 600 Feet Assistive device: Rolling walker (2 wheeled) Gait Pattern/deviations: Step-through pattern;Antalgic;Trunk flexed Gait velocity: decreased Gait velocity interpretation: Below normal speed for age/gender General Gait Details: Supervision for safety.  Cues to dec WB through BUEs as pt c/o BUEs become fatigued.  Pt w/ forward trunk lean which she says is hard to correct 2/2 h/o back pain   Stairs            Wheelchair Mobility    Modified Rankin (Stroke Patients  Only)       Balance Overall balance assessment: Needs assistance Sitting-balance support: Feet supported;No upper extremity supported Sitting balance-Leahy Scale: Good     Standing balance support: Bilateral upper extremity supported;During functional activity Standing balance-Leahy Scale: Fair                      Cognition Arousal/Alertness: Awake/alert Behavior During Therapy: WFL for tasks assessed/performed Overall Cognitive Status: Within Functional Limits for tasks assessed                      Exercises Total Joint Exercises Hip ABduction/ADduction: AROM;Both;10 reps;Standing Knee Flexion: AROM;10 reps;Right;Standing Marching in Standing: AROM;Both;10 reps;Standing Other Exercises Other Exercises: mini squats x 10 reps    General Comments        Pertinent Vitals/Pain Pain Assessment: 0-10 Pain Score: 6  (w/ mobility) Pain Location: R hip Pain Descriptors / Indicators: Aching;Discomfort Pain Intervention(s): Limited activity within patient's tolerance;Monitored during session;Repositioned    Home Living                      Prior Function            PT Goals (current goals can now be found in the care plan section) Acute Rehab PT Goals Patient Stated Goal: home PT Goal Formulation: With patient Time For Goal Achievement: 09/10/14 Potential to Achieve Goals: Good Progress towards PT goals: Progressing toward goals    Frequency  7X/week    PT Plan Current plan remains appropriate    Co-evaluation  End of Session Equipment Utilized During Treatment: Gait belt Activity Tolerance: Other (comment);Patient tolerated treatment well (pt w/ nausea at end of session once returning to chair) Patient left: in chair;with call bell/phone within reach     Time: 1203-1226 PT Time Calculation (min) (ACUTE ONLY): 23 min  Charges:  $Gait Training: 8-22 mins $Therapeutic Exercise: 8-22 mins                    G  Codes:      Joslyn Hy PT, [place] [phone] Pager: [phone] 09/04/2014, 1:14 PM

## 2014-09-04 NOTE — Discharge Summary (Signed)
Patient ID: [name] MRN: [id number] DOB/AGE: 04/29/1950 64 y.o.  Admit date: 09/02/2014 Discharge date: 09/04/2014  Admission Diagnoses:  Principal Problem:   Osteoarthritis of right hip Active Problems:   Status post total replacement of right hip   Discharge Diagnoses:  Same  Past Medical History  Diagnosis Date  . Swelling of both lower extremities   . Atrial fibrillation   . Melanoma   . Skin cancer   . Scoliosis   . Difficult intubation     pt states that her neck needs to be in a neutral position,  scoliosis  . PONV (postoperative nausea and vomiting)   . Family history of adverse reaction to anesthesia     nausea  . Dysrhythmia     dr Debara Pickett; A. Fib  . Peripheral vascular disease   . Thyroid nodule   . Urinary frequency   . Urinary incontinence   . GERD (gastroesophageal reflux disease)     protonix  . Arthritis     Surgeries: Procedure(s): RIGHT TOTAL HIP ARTHROPLASTY ANTERIOR APPROACH on 09/02/2014   Consultants:  PT/OT  Discharged Condition: Improved  Hospital Course: [name] is an 64 y.o. female who was admitted 09/02/2014 for operative treatment ofOsteoarthritis of right hip. Patient has severe unremitting pain that affects sleep, daily activities, and work/hobbies. After pre-op clearance the patient was taken to the operating room on 09/02/2014 and underwent  Procedure(s): RIGHT TOTAL HIP ARTHROPLASTY ANTERIOR APPROACH.    Patient was given perioperative antibiotics: Anti-infectives    Start     Dose/Rate Route Frequency Ordered Stop   09/02/14 2000  ceFAZolin (ANCEF) IVPB 1 g/50 mL premix     1 g 100 mL/hr over 30 Minutes Intravenous Every 6 hours 09/02/14 1741 09/03/14 0254   09/02/14 0730  ceFAZolin (ANCEF) IVPB 2 g/50 mL premix     2 g 100 mL/hr over 30 Minutes Intravenous To ShortStay Surgical 09/01/14 1403 09/02/14 1330       Patient was given sequential compression devices, early ambulation, and chemoprophylaxis to prevent  DVT.  Patient benefited maximally from hospital stay and there were no complications.    Recent vital signs: Patient Vitals for the past 24 hrs:  BP Temp Temp src Pulse Resp SpO2  09/04/14 0510 (!) 117/49 mmHg 98.4 F (36.9 C) Oral 84 16 95 %  09/03/14 2149 (!) 134/56 mmHg 98.8 F (37.1 C) Oral 82 16 98 %  09/03/14 1912 109/78 mmHg - - 87 - -  09/03/14 1430 (!) 89/41 mmHg 97.6 F (36.4 C) Oral 76 16 100 %     Recent laboratory studies:  Recent Labs  09/03/14 0549 09/04/14 0336  WBC 10.5 8.9  HGB 12.2 10.4*  HCT 35.5* 30.4*  PLT 213 190  NA 134*  --   K 5.1  --   CL 97*  --   CO2 29  --   BUN 16  --   CREATININE 0.61  --   GLUCOSE 133*  --   CALCIUM 8.8*  --      Discharge Medications:     Medication List    STOP taking these medications        ibuprofen 200 MG tablet  Commonly known as:  ADVIL,MOTRIN     traMADol 50 MG tablet  Commonly known as:  ULTRAM      TAKE these medications        aspirin 325 MG EC tablet  Take 1 tablet (325 mg total) by mouth  2 (two) times daily after a meal.     bisacodyl 10 MG suppository  Commonly known as:  DULCOLAX  Place 1 suppository (10 mg total) rectally daily as needed for moderate constipation.     Calcium-Vitamin D 600-400 MG-UNIT Tabs  Take 1 tablet by mouth 2 (two) times daily.     diltiazem 240 MG 24 hr capsule  Commonly known as:  TIAZAC  Take 1 capsule (240 mg total) by mouth daily.     docusate sodium 100 MG capsule  Commonly known as:  COLACE  Take 1 capsule (100 mg total) by mouth 2 (two) times daily.     flecainide 50 MG tablet  Commonly known as:  TAMBOCOR  Take 1.5 tablets (75 mg total) by mouth 2 (two) times daily.     oxyCODONE 5 MG immediate release tablet  Commonly known as:  Oxy IR/ROXICODONE  Take 1-2 tablets (5-10 mg total) by mouth every 4 (four) hours as needed for severe pain.     pantoprazole 40 MG tablet  Commonly known as:  PROTONIX  Take 40 mg by mouth daily.     senna 8.6 MG  tablet  Commonly known as:  SENOKOT  Take 1 tablet by mouth daily.     spironolactone 50 MG tablet  Commonly known as:  ALDACTONE  Take 50-100 mg by mouth 2 (two) times daily. 100 mg am and 50 mg pm     triamcinolone cream 0.1 %  Commonly known as:  KENALOG  Apply 1 application topically as needed (for dry skin).        Diagnostic Studies: Dg Hip Port Unilat With Pelvis 1v Right  09/02/2014   CLINICAL DATA:  Total replacement of right hip.  Postop.  EXAM: RIGHT HIP (WITH PELVIS) 1 VIEW PORTABLE  COMPARISON:  06/17/2014  FINDINGS: Interval total right hip replacement. No hardware or bony complicating feature. Soft tissue gas noted. Remote changes of left hip replacement.  IMPRESSION: Interval right hip placement and remote left hip placement. No complicating features.   Electronically Signed   By: Rolm Baptise M.D.   On: 09/02/2014 16:16   Dg Hip Operative Unilat With Pelvis Right  09/02/2014   CLINICAL DATA:  Right hip replacement  EXAM: OPERATIVE RIGHT HIP WITH PELVIS  COMPARISON:  None.  FLUOROSCOPY TIME:  Radiation Exposure Index (as provided by the fluoroscopic device): Not available  If the device does not provide the exposure index:  Fluoroscopy Time:  37.9 seconds  Number of Acquired Images:  4  FINDINGS: A right hip replacement is now seen. It appears well seated. No acute bony abnormality is noted.  IMPRESSION: Status post right hip replacement   Electronically Signed   By: Inez Catalina M.D.   On: 09/02/2014 15:02    Disposition: 06-Home-Health Care Svc      Discharge Instructions    Weight bearing as tolerated    Complete by:  As directed   Laterality:  right  Extremity:  Lower           Follow-up Information    Follow up with Mcarthur Rossetti, MD In 2 weeks.   Specialty:  Orthopedic Surgery   Contact information:   [place] [address] [phone]       Follow up with Mcarthur Rossetti, MD. Schedule an appointment as soon as  possible for a visit in 2 weeks.   Specialty:  Orthopedic Surgery   Contact information:   [place]  [place] [address] [phone]        Signed: [name] 09/04/2014, 12:19 PM

## 2014-09-04 NOTE — Care Management Note (Signed)
Case Management Note  Patient Details  Name: [name] MRN: [id number] Date of Birth: 1951-01-02  Subjective/Objective:             S/p right total hip arthroplasty       Action/Plan: Set up with [facility] for HPT by MD office. Spoke with patient, no change in discharge plan. Patient states that her daughter will be able to assist her after discharge. Patient states that he has a rolling walker and a 3N1 at home from previous surgery.    Expected Discharge Date:                  Expected Discharge Plan:  [place]  In-House Referral:  NA  Discharge planning Services  CM Consult  Post Acute Care Choice:  Home Health Choice offered to:  Patient  DME Arranged:    DME Agency:     HH Arranged:  PT HH Agency:  [facility]  Status of Service:  In process, will continue to follow  Medicare Important Message Given:    Date Medicare IM Given:    Medicare IM give by:    Date Additional Medicare IM Given:    Additional Medicare Important Message give by:     If discussed at [place] of Stay Meetings, dates discussed:    Additional Comments:  Nila Nephew, RN 09/04/2014, 12:50 PM

## 2014-09-04 NOTE — Progress Notes (Signed)
Subjective: 2 Days Post-Op Procedure(s) (LRB): RIGHT TOTAL HIP ARTHROPLASTY ANTERIOR APPROACH (Right) Patient reports pain as mild.  Wants to go home today. Denies chest pain SOB or calf pain.  Objective: Vital signs in last 24 hours: Temp:  [97.6 F (36.4 C)-98.8 F (37.1 C)] 98.4 F (36.9 C) (06/23 0510) Pulse Rate:  [76-87] 84 (06/23 0510) Resp:  [16] 16 (06/23 0510) BP: (89-134)/(41-78) 117/49 mmHg (06/23 0510) SpO2:  [95 %-100 %] 95 % (06/23 0510)  Intake/Output from previous day: 06/22 0701 - 06/23 0700 In: 692.5 [P.O.:480; I.V.:212.5] Out: -  Intake/Output this shift: Total I/O In: 240 [P.O.:240] Out: -    Recent Labs  09/03/14 0549 09/04/14 0336  HGB 12.2 10.4*    Recent Labs  09/03/14 0549 09/04/14 0336  WBC 10.5 8.9  RBC 3.55* 3.07*  HCT 35.5* 30.4*  PLT 213 190    Recent Labs  09/03/14 0549  NA 134*  K 5.1  CL 97*  CO2 29  BUN 16  CREATININE 0.61  GLUCOSE 133*  CALCIUM 8.8*   No results for input(s): LABPT, INR in the last 72 hours.   Right Lower Extremity: Sensation intact distally Intact pulses distally Dorsiflexion/Plantar flexion intact Incision: scant drainage Compartment soft  Pulse regular regular  Assessment/Plan: 2 Days Post-Op Procedure(s) (LRB): RIGHT TOTAL HIP ARTHROPLASTY ANTERIOR APPROACH (Right) Up with therapy Discharge home with home health  [name] 09/04/2014, 12:05 PM

## 2014-09-05 ENCOUNTER — Inpatient Hospital Stay: Admit: 2014-09-05 | Payer: Self-pay | Admitting: Orthopaedic Surgery

## 2014-09-05 SURGERY — ARTHROPLASTY, HIP, TOTAL, ANTERIOR APPROACH
Anesthesia: Choice | Laterality: Right

## 2014-09-06 DIAGNOSIS — G8929 Other chronic pain: Secondary | ICD-10-CM | POA: Diagnosis not present

## 2014-09-06 DIAGNOSIS — I480 Paroxysmal atrial fibrillation: Secondary | ICD-10-CM | POA: Diagnosis not present

## 2014-09-06 DIAGNOSIS — M47817 Spondylosis without myelopathy or radiculopathy, lumbosacral region: Secondary | ICD-10-CM | POA: Diagnosis not present

## 2014-09-06 DIAGNOSIS — Z471 Aftercare following joint replacement surgery: Secondary | ICD-10-CM | POA: Diagnosis not present

## 2014-09-06 DIAGNOSIS — Z96643 Presence of artificial hip joint, bilateral: Secondary | ICD-10-CM | POA: Diagnosis not present

## 2014-09-06 DIAGNOSIS — I739 Peripheral vascular disease, unspecified: Secondary | ICD-10-CM | POA: Diagnosis not present

## 2014-09-08 DIAGNOSIS — I480 Paroxysmal atrial fibrillation: Secondary | ICD-10-CM | POA: Diagnosis not present

## 2014-09-08 DIAGNOSIS — Z96643 Presence of artificial hip joint, bilateral: Secondary | ICD-10-CM | POA: Diagnosis not present

## 2014-09-08 DIAGNOSIS — M47817 Spondylosis without myelopathy or radiculopathy, lumbosacral region: Secondary | ICD-10-CM | POA: Diagnosis not present

## 2014-09-08 DIAGNOSIS — I739 Peripheral vascular disease, unspecified: Secondary | ICD-10-CM | POA: Diagnosis not present

## 2014-09-08 DIAGNOSIS — G8929 Other chronic pain: Secondary | ICD-10-CM | POA: Diagnosis not present

## 2014-09-08 DIAGNOSIS — Z471 Aftercare following joint replacement surgery: Secondary | ICD-10-CM | POA: Diagnosis not present

## 2014-09-10 DIAGNOSIS — Z96643 Presence of artificial hip joint, bilateral: Secondary | ICD-10-CM | POA: Diagnosis not present

## 2014-09-10 DIAGNOSIS — I480 Paroxysmal atrial fibrillation: Secondary | ICD-10-CM | POA: Diagnosis not present

## 2014-09-10 DIAGNOSIS — Z471 Aftercare following joint replacement surgery: Secondary | ICD-10-CM | POA: Diagnosis not present

## 2014-09-10 DIAGNOSIS — M47817 Spondylosis without myelopathy or radiculopathy, lumbosacral region: Secondary | ICD-10-CM | POA: Diagnosis not present

## 2014-09-10 DIAGNOSIS — G8929 Other chronic pain: Secondary | ICD-10-CM | POA: Diagnosis not present

## 2014-09-10 DIAGNOSIS — I739 Peripheral vascular disease, unspecified: Secondary | ICD-10-CM | POA: Diagnosis not present

## 2014-09-12 DIAGNOSIS — I739 Peripheral vascular disease, unspecified: Secondary | ICD-10-CM | POA: Diagnosis not present

## 2014-09-12 DIAGNOSIS — I480 Paroxysmal atrial fibrillation: Secondary | ICD-10-CM | POA: Diagnosis not present

## 2014-09-12 DIAGNOSIS — G8929 Other chronic pain: Secondary | ICD-10-CM | POA: Diagnosis not present

## 2014-09-12 DIAGNOSIS — Z471 Aftercare following joint replacement surgery: Secondary | ICD-10-CM | POA: Diagnosis not present

## 2014-09-12 DIAGNOSIS — Z96643 Presence of artificial hip joint, bilateral: Secondary | ICD-10-CM | POA: Diagnosis not present

## 2014-09-12 DIAGNOSIS — M47817 Spondylosis without myelopathy or radiculopathy, lumbosacral region: Secondary | ICD-10-CM | POA: Diagnosis not present

## 2014-09-16 DIAGNOSIS — Z96643 Presence of artificial hip joint, bilateral: Secondary | ICD-10-CM | POA: Diagnosis not present

## 2014-09-16 DIAGNOSIS — G8929 Other chronic pain: Secondary | ICD-10-CM | POA: Diagnosis not present

## 2014-09-16 DIAGNOSIS — I480 Paroxysmal atrial fibrillation: Secondary | ICD-10-CM | POA: Diagnosis not present

## 2014-09-16 DIAGNOSIS — I739 Peripheral vascular disease, unspecified: Secondary | ICD-10-CM | POA: Diagnosis not present

## 2014-09-16 DIAGNOSIS — M47817 Spondylosis without myelopathy or radiculopathy, lumbosacral region: Secondary | ICD-10-CM | POA: Diagnosis not present

## 2014-09-16 DIAGNOSIS — Z471 Aftercare following joint replacement surgery: Secondary | ICD-10-CM | POA: Diagnosis not present

## 2014-09-17 DIAGNOSIS — G8929 Other chronic pain: Secondary | ICD-10-CM | POA: Diagnosis not present

## 2014-09-17 DIAGNOSIS — M47817 Spondylosis without myelopathy or radiculopathy, lumbosacral region: Secondary | ICD-10-CM | POA: Diagnosis not present

## 2014-09-17 DIAGNOSIS — M1612 Unilateral primary osteoarthritis, left hip: Secondary | ICD-10-CM | POA: Diagnosis not present

## 2014-09-17 DIAGNOSIS — Z96643 Presence of artificial hip joint, bilateral: Secondary | ICD-10-CM | POA: Diagnosis not present

## 2014-09-17 DIAGNOSIS — M25552 Pain in left hip: Secondary | ICD-10-CM | POA: Diagnosis not present

## 2014-09-17 DIAGNOSIS — Z471 Aftercare following joint replacement surgery: Secondary | ICD-10-CM | POA: Diagnosis not present

## 2014-09-17 DIAGNOSIS — I739 Peripheral vascular disease, unspecified: Secondary | ICD-10-CM | POA: Diagnosis not present

## 2014-09-17 DIAGNOSIS — I480 Paroxysmal atrial fibrillation: Secondary | ICD-10-CM | POA: Diagnosis not present

## 2014-09-17 DIAGNOSIS — M25551 Pain in right hip: Secondary | ICD-10-CM | POA: Diagnosis not present

## 2014-09-17 DIAGNOSIS — M1611 Unilateral primary osteoarthritis, right hip: Secondary | ICD-10-CM | POA: Diagnosis not present

## 2014-09-18 DIAGNOSIS — I739 Peripheral vascular disease, unspecified: Secondary | ICD-10-CM | POA: Diagnosis not present

## 2014-09-18 DIAGNOSIS — G8929 Other chronic pain: Secondary | ICD-10-CM | POA: Diagnosis not present

## 2014-09-18 DIAGNOSIS — Z471 Aftercare following joint replacement surgery: Secondary | ICD-10-CM | POA: Diagnosis not present

## 2014-09-18 DIAGNOSIS — Z1231 Encounter for screening mammogram for malignant neoplasm of breast: Secondary | ICD-10-CM | POA: Diagnosis not present

## 2014-09-18 DIAGNOSIS — Z96643 Presence of artificial hip joint, bilateral: Secondary | ICD-10-CM | POA: Diagnosis not present

## 2014-09-18 DIAGNOSIS — M47817 Spondylosis without myelopathy or radiculopathy, lumbosacral region: Secondary | ICD-10-CM | POA: Diagnosis not present

## 2014-09-18 DIAGNOSIS — C541 Malignant neoplasm of endometrium: Secondary | ICD-10-CM | POA: Diagnosis not present

## 2014-09-18 DIAGNOSIS — I480 Paroxysmal atrial fibrillation: Secondary | ICD-10-CM | POA: Diagnosis not present

## 2014-09-18 DIAGNOSIS — C55 Malignant neoplasm of uterus, part unspecified: Secondary | ICD-10-CM | POA: Diagnosis not present

## 2014-10-01 DIAGNOSIS — D225 Melanocytic nevi of trunk: Secondary | ICD-10-CM | POA: Diagnosis not present

## 2014-10-01 DIAGNOSIS — L814 Other melanin hyperpigmentation: Secondary | ICD-10-CM | POA: Diagnosis not present

## 2014-10-01 DIAGNOSIS — L905 Scar conditions and fibrosis of skin: Secondary | ICD-10-CM | POA: Diagnosis not present

## 2014-10-01 DIAGNOSIS — Z85820 Personal history of malignant melanoma of skin: Secondary | ICD-10-CM | POA: Diagnosis not present

## 2014-10-03 ENCOUNTER — Ambulatory Visit (HOSPITAL_BASED_OUTPATIENT_CLINIC_OR_DEPARTMENT_OTHER): Payer: Medicare Other | Admitting: Physical Medicine & Rehabilitation

## 2014-10-03 ENCOUNTER — Encounter: Payer: Self-pay | Admitting: Physical Medicine & Rehabilitation

## 2014-10-03 ENCOUNTER — Encounter: Payer: Medicare Other | Attending: Physical Medicine & Rehabilitation

## 2014-10-03 VITALS — BP 116/70 | HR 78 | Resp 14

## 2014-10-03 DIAGNOSIS — Z823 Family history of stroke: Secondary | ICD-10-CM | POA: Diagnosis not present

## 2014-10-03 DIAGNOSIS — Z833 Family history of diabetes mellitus: Secondary | ICD-10-CM | POA: Diagnosis not present

## 2014-10-03 DIAGNOSIS — Z8249 Family history of ischemic heart disease and other diseases of the circulatory system: Secondary | ICD-10-CM | POA: Diagnosis not present

## 2014-10-03 DIAGNOSIS — M47817 Spondylosis without myelopathy or radiculopathy, lumbosacral region: Secondary | ICD-10-CM | POA: Diagnosis not present

## 2014-10-03 DIAGNOSIS — I739 Peripheral vascular disease, unspecified: Secondary | ICD-10-CM | POA: Insufficient documentation

## 2014-10-03 DIAGNOSIS — M419 Scoliosis, unspecified: Secondary | ICD-10-CM | POA: Diagnosis not present

## 2014-10-03 DIAGNOSIS — Z809 Family history of malignant neoplasm, unspecified: Secondary | ICD-10-CM | POA: Insufficient documentation

## 2014-10-03 DIAGNOSIS — Z96642 Presence of left artificial hip joint: Secondary | ICD-10-CM

## 2014-10-03 DIAGNOSIS — Z96649 Presence of unspecified artificial hip joint: Secondary | ICD-10-CM

## 2014-10-03 DIAGNOSIS — Z85820 Personal history of malignant melanoma of skin: Secondary | ICD-10-CM | POA: Insufficient documentation

## 2014-10-03 DIAGNOSIS — M545 Low back pain: Secondary | ICD-10-CM | POA: Insufficient documentation

## 2014-10-03 DIAGNOSIS — K219 Gastro-esophageal reflux disease without esophagitis: Secondary | ICD-10-CM | POA: Insufficient documentation

## 2014-10-03 DIAGNOSIS — Z96641 Presence of right artificial hip joint: Secondary | ICD-10-CM

## 2014-10-03 NOTE — Progress Notes (Signed)
Subjective:    Patient ID: [name], female    DOB: 12/04/1950, 64 y.o.   MRN: [id number] Left L3,4,5 RF March 2016 HPI Right Hip DJD, 6/21, 2 nights in hospital in home therapy [facility] completed Takes tramadol one per night still has Rx   Low back pain overall doing well. Some twinges of pain on the left in the buttock area. Pain Inventory Average Pain 0 Pain Right Now 0 My pain is intermittent and aching  In the last 24 hours, has pain interfered with the following? General activity 0 Relation with others 0 Enjoyment of life 0 What TIME of day is your pain at its worst? night Sleep (in general) Poor  Pain is worse with: inactivity Pain improves with: No Pain Relief from Meds: 7  Mobility walk without assistance how many minutes can you walk? 30 ability to climb steps?  yes do you drive?  yes  Function retired  Neuro/Psych No problems in this area  Prior Studies Any changes since last visit?  no  Physicians involved in your care Any changes since last visit?  no   Family History  Problem Relation Age of Onset  . Uterine cancer Mother   . Diabetes Brother 28  . Hypertension Father 17  . Hyperlipidemia Father   . Cancer Father 30  . Epilepsy Father 59  . Arthritis Sister 23  . Cancer Maternal Grandmother   . Stroke Paternal Grandfather   . Arthritis Sister    History   Social History  . Marital Status: Divorced    Spouse Name: N/A  . Number of Children: 2  . Years of Education: JD   Occupational History  . lawyer    Social History Main Topics  . Smoking status: Never Smoker   . Smokeless tobacco: Never Used  . Alcohol Use: 0.0 oz/week    0 Standard drinks or equivalent per week     Comment: rarely  . Drug Use: No  . Sexual Activity: Not on file   Other Topics Concern  . None   Social History Narrative   Past Surgical History  Procedure Laterality Date  . Abdominal hysterectomy  09/10/2013  . Melanoma excision  12/2011  . Vein  surgery  05/2011    venous ablation   . Bunionectomy  10/1976  . Fracture surgery  09/1964  . Total hip arthroplasty Left 06/17/2014    Procedure: LEFT TOTAL HIP ARTHROPLASTY ANTERIOR APPROACH;  Surgeon: Mcarthur Rossetti, MD;  Location: [facility];  Service: Orthopedics;  Laterality: Left;  . Colonoscopy    . Total hip arthroplasty Right 09/02/2014    Procedure: RIGHT TOTAL HIP ARTHROPLASTY ANTERIOR APPROACH;  Surgeon: Mcarthur Rossetti, MD;  Location: [facility];  Service: Orthopedics;  Laterality: Right;   Past Medical History  Diagnosis Date  . Swelling of both lower extremities   . Atrial fibrillation   . Melanoma   . Skin cancer   . Scoliosis   . Difficult intubation     pt states that her neck needs to be in a neutral position,  scoliosis  . PONV (postoperative nausea and vomiting)   . Family history of adverse reaction to anesthesia     nausea  . Dysrhythmia     dr Debara Pickett; A. Fib  . Peripheral vascular disease   . Thyroid nodule   . Urinary frequency   . Urinary incontinence   . GERD (gastroesophageal reflux disease)     protonix  . Arthritis  BP 116/70 mmHg  Pulse 78  Resp 14  SpO2 98%  Opioid Risk Score:   Fall Risk Score:  `1  Depression screen PHQ 2/9  Depression screen [facility] 2/9 10/03/2014 06/03/2014  Decreased Interest 0 1  Down, Depressed, Hopeless 0 0  PHQ - 2 Score 0 1  Altered sleeping - 3  Tired, decreased energy - 0  Change in appetite - 0  Feeling bad or failure about yourself  - 0  Trouble concentrating - 0  Moving slowly or fidgety/restless - 0  Suicidal thoughts - 0  PHQ-9 Score - 4     Review of Systems  Constitutional: Negative.   HENT: Negative.   Eyes: Negative.   Respiratory: Negative.   Cardiovascular: Negative.   Gastrointestinal: Negative.   Endocrine: Negative.   Genitourinary: Negative.   Musculoskeletal: Negative.   Skin: Negative.   Allergic/Immunologic: Negative.   Neurological: Negative.   Hematological: Negative.     Psychiatric/Behavioral: Negative.        Objective:   Physical Exam  Constitutional: She is oriented to person, place, and time. She appears well-developed and well-nourished.  HENT:  Head: Normocephalic and atraumatic.  Eyes: Conjunctivae and EOM are normal. Pupils are equal, round, and reactive to light.  Musculoskeletal:  No tenderness in the low back  No pain with flexion, ext , lateral bending  Hips with full ext  - SLR  Neurological: She is alert and oriented to person, place, and time.  Psychiatric: She has a normal mood and affect.  Nursing note and vitals reviewed.         Assessment & Plan:  1. Right hip osteoarthritis end-stage ,  S/p Bilateral  THA, doing very well Postop. Flexion contracture has essentially resolved. Some paresthesia anterior thighs most likely due to stretching of cutaneous nerves and appears to be resolving 2.  Lumbar spondylosis- no need for RF on left at this time F/u 92mo Call if increasing L side low back pain, May repeat left L3 L4 L5 radiofrequency as soon as September 2016

## 2014-10-22 ENCOUNTER — Ambulatory Visit: Payer: Medicare Other | Admitting: Internal Medicine

## 2014-11-12 DIAGNOSIS — R350 Frequency of micturition: Secondary | ICD-10-CM | POA: Diagnosis not present

## 2014-11-12 DIAGNOSIS — N3000 Acute cystitis without hematuria: Secondary | ICD-10-CM | POA: Diagnosis not present

## 2014-12-10 ENCOUNTER — Ambulatory Visit (INDEPENDENT_AMBULATORY_CARE_PROVIDER_SITE_OTHER): Payer: Medicare Other | Admitting: Internal Medicine

## 2014-12-10 ENCOUNTER — Encounter: Payer: Self-pay | Admitting: Internal Medicine

## 2014-12-10 VITALS — BP 134/82 | HR 80 | Ht 70.5 in | Wt 227.0 lb

## 2014-12-10 DIAGNOSIS — I480 Paroxysmal atrial fibrillation: Secondary | ICD-10-CM | POA: Diagnosis not present

## 2014-12-10 NOTE — Progress Notes (Signed)
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 She is from [place] and was living in the [place] and [place] area. Her past medical history is significant for atrial fibrillation. This was first noted in 2012 and she was controlled initially with diltiazem. She was in a hospital in atrial fibrillation and spontaneously converted back to sinus. Subsequently she had some breakthroughs in her dose of diltiazem was increased. Eventually she saw different cardiologist and was placed on flecainide 50 mg twice a day has had only one breakthrough that she is aware of since starting on this medication. This was associated with a UTI which she was given Cipro, which I cautioned her about using in the future due to QT interactions. She also has a history of uterine cancer fortunately and is a survivor. She continues to have problems with bilateral hip osteoarthritis and is scheduled to have left hip replacement in the near future. She denies any chest pain or shortness of breath with exertion. She reports her prior cardiologist in [place] did stress testing within the last 2 years and has done several stress tests and she started with atrial fibrillation. There is no family history of coronary disease although cancer is somewhat rapid in the family.  Mr. Saur is doing fairly well today. She denies any chest pain or shortness of breath. She reports no recurrence of A. fib that she is aware of. She is maintaining normal sinus rhythm and is on flecainide 50 mg twice a day. QTc interval is 432 ms. She is also taking aspirin 650 mg daily.  I had the pleasure seen Katonya back in the office today. She is doing exceedingly well. She had recent hip replacement and is ambulating much better. She reports her pain is improved and she is now off of narcotics. She's had no further  problems with palpitations after increasing her flecainide to 75 mg twice a day. Her EKG remained stable and heart rate is 80 today with a QTC of 454 ms. She tells me that her granddaughter recently had twins that were premature but are doing well and she is enjoying being grandmother.  PMHx:  Past Medical History  Diagnosis Date  . Swelling of both lower extremities   . Atrial fibrillation   . Melanoma   . Skin cancer   . Scoliosis   . Difficult intubation     pt states that her neck needs to be in a neutral position,  scoliosis  . PONV (postoperative nausea and vomiting)   . Family history of adverse reaction to anesthesia     nausea  . Dysrhythmia     dr Debara Pickett; A. Fib  . Peripheral vascular disease   . Thyroid nodule   . Urinary frequency   . Urinary incontinence   . GERD (gastroesophageal reflux disease)     protonix  . Arthritis     Past Surgical History  Procedure Laterality Date  . Abdominal hysterectomy  09/10/2013  . Melanoma excision  12/2011  . Vein surgery  05/2011    venous ablation   . Bunionectomy  10/1976  . Fracture surgery  09/1964  . Total hip arthroplasty Left 06/17/2014    Procedure: LEFT TOTAL HIP ARTHROPLASTY ANTERIOR APPROACH;  Surgeon: Mcarthur Rossetti, MD;  Location: [place];  Service: Orthopedics;  Laterality: Left;  . Colonoscopy    .  Total hip arthroplasty Right 09/02/2014    Procedure: RIGHT TOTAL HIP ARTHROPLASTY ANTERIOR APPROACH;  Surgeon: Mcarthur Rossetti, MD;  Location: [facility];  Service: Orthopedics;  Laterality: Right;    FAMHx:  Family History  Problem Relation Age of Onset  . Uterine cancer Mother   . Diabetes Brother 44  . Hypertension Father 75  . Hyperlipidemia Father   . Cancer Father 24  . Epilepsy Father 31  . Arthritis Sister 46  . Cancer Maternal Grandmother   . Stroke Paternal Grandfather   . Arthritis Sister     SOCHx:   reports that she has never smoked. She has never used smokeless tobacco. She reports that  she drinks alcohol. She reports that she does not use illicit drugs.  ALLERGIES:  Allergies  Allergen Reactions  . Hydrolyzed Silk Hives and Other (See Comments)    Silk tape-blisters  . Gluten Meal Other (See Comments)    reflux    ROS: A comprehensive review of systems was negative.  HOME MEDS: Current Outpatient Prescriptions  Medication Sig Dispense Refill  . aspirin EC 325 MG EC tablet Take 1 tablet (325 mg total) by mouth 2 (two) times daily after a meal. 40 tablet 0  . Calcium Carb-Cholecalciferol (CALCIUM-VITAMIN D) 600-400 MG-UNIT TABS Take 1 tablet by mouth 2 (two) times daily.    Marland Kitchen diltiazem (TIAZAC) 240 MG 24 hr capsule Take 1 capsule (240 mg total) by mouth daily. 90 capsule 3  . flecainide (TAMBOCOR) 50 MG tablet Take 1.5 tablets (75 mg total) by mouth 2 (two) times daily. 270 tablet 3  . pantoprazole (PROTONIX) 40 MG tablet Take 40 mg by mouth daily.   1  . senna (SENOKOT) 8.6 MG tablet Take 1 tablet by mouth daily.    Marland Kitchen spironolactone (ALDACTONE) 50 MG tablet Take 50-100 mg by mouth 2 (two) times daily. 100 mg am and 50 mg pm    . triamcinolone cream (KENALOG) 0.1 % Apply 1 application topically as needed (for dry skin).   1   No current facility-administered medications for this visit.    LABS/IMAGING: No results found for this or any previous visit (from the past 48 hour(s)). No results found.  VITALS: BP 134/82 mmHg  Pulse 80  Ht 5' 10.5" (1.791 m)  Wt 227 lb (102.967 kg)  BMI 32.10 kg/m2  EXAM: Deferred  EKG: Normal sinus rhythm at 80, QTC 454 ms  ASSESSMENT: 1. History of paroxysmal atrial fibrillation - CHADSVASC of 1 (female/64) 2. Long term antiarrhythmic therapy on flecainide 3. Bilateral hip osteoarthritis - status post bilateral TKR's  PLAN: 1.   Mrs. Si underwent hip surgery successfully this time. It seems that the increased dose of flecainide was able to keep her from having further palpitations. Overall she's feeling well and has  gotten back to normal activity. We'll continue her current medications and a plan to see her back annually.  Pixie Casino, MD, [facility] Attending Cardiologist [place] 12/10/2014, 5:13 PM

## 2014-12-10 NOTE — Patient Instructions (Signed)
Dr Hilty recommends that you schedule a follow-up appointment in 1 year. You will receive a reminder letter in the mail two months in advance. If you don't receive a letter, please call our office to schedule the follow-up appointment. 

## 2014-12-29 ENCOUNTER — Other Ambulatory Visit: Payer: Self-pay | Admitting: Surgery

## 2014-12-29 DIAGNOSIS — E042 Nontoxic multinodular goiter: Secondary | ICD-10-CM

## 2015-01-02 ENCOUNTER — Ambulatory Visit
Admission: RE | Admit: 2015-01-02 | Discharge: 2015-01-02 | Disposition: A | Discharge status: Home / Self Care | Payer: Medicare Other | Source: Ambulatory Visit | Attending: Surgery | Admitting: Surgery

## 2015-01-02 DIAGNOSIS — E042 Nontoxic multinodular goiter: Secondary | ICD-10-CM | POA: Diagnosis not present

## 2015-01-11 DIAGNOSIS — Z23 Encounter for immunization: Secondary | ICD-10-CM | POA: Diagnosis not present

## 2015-02-02 DIAGNOSIS — E042 Nontoxic multinodular goiter: Secondary | ICD-10-CM | POA: Diagnosis not present

## 2015-02-02 DIAGNOSIS — R350 Frequency of micturition: Secondary | ICD-10-CM | POA: Diagnosis not present

## 2015-02-02 DIAGNOSIS — N3000 Acute cystitis without hematuria: Secondary | ICD-10-CM | POA: Diagnosis not present

## 2015-02-26 DIAGNOSIS — C541 Malignant neoplasm of endometrium: Secondary | ICD-10-CM | POA: Diagnosis not present

## 2015-02-26 DIAGNOSIS — C55 Malignant neoplasm of uterus, part unspecified: Secondary | ICD-10-CM | POA: Diagnosis not present

## 2015-04-02 ENCOUNTER — Ambulatory Visit: Payer: Medicare Other | Admitting: Physical Medicine & Rehabilitation

## 2015-04-08 ENCOUNTER — Other Ambulatory Visit: Payer: Self-pay | Admitting: Internal Medicine

## 2015-04-13 DIAGNOSIS — N3000 Acute cystitis without hematuria: Secondary | ICD-10-CM | POA: Diagnosis not present

## 2015-04-13 DIAGNOSIS — R350 Frequency of micturition: Secondary | ICD-10-CM | POA: Diagnosis not present

## 2015-05-25 ENCOUNTER — Encounter: Payer: Self-pay | Admitting: Family Medicine

## 2015-05-25 ENCOUNTER — Ambulatory Visit (INDEPENDENT_AMBULATORY_CARE_PROVIDER_SITE_OTHER): Payer: Medicare Other | Admitting: Family Medicine

## 2015-05-25 VITALS — BP 120/70 | HR 75 | Temp 97.3°F | Ht 70.0 in | Wt 229.8 lb

## 2015-05-25 DIAGNOSIS — Z8679 Personal history of other diseases of the circulatory system: Secondary | ICD-10-CM | POA: Insufficient documentation

## 2015-05-25 DIAGNOSIS — Z8542 Personal history of malignant neoplasm of other parts of uterus: Secondary | ICD-10-CM

## 2015-05-25 DIAGNOSIS — K219 Gastro-esophageal reflux disease without esophagitis: Secondary | ICD-10-CM | POA: Insufficient documentation

## 2015-05-25 DIAGNOSIS — I480 Paroxysmal atrial fibrillation: Secondary | ICD-10-CM | POA: Diagnosis not present

## 2015-05-25 DIAGNOSIS — Z85820 Personal history of malignant melanoma of skin: Secondary | ICD-10-CM

## 2015-05-25 DIAGNOSIS — I872 Venous insufficiency (chronic) (peripheral): Secondary | ICD-10-CM | POA: Insufficient documentation

## 2015-05-25 DIAGNOSIS — Z7189 Other specified counseling: Secondary | ICD-10-CM | POA: Diagnosis not present

## 2015-05-25 DIAGNOSIS — Z7689 Persons encountering health services in other specified circumstances: Secondary | ICD-10-CM

## 2015-05-25 NOTE — Progress Notes (Signed)
Pre visit review using our clinic review tool, if applicable. No additional management support is needed unless otherwise documented below in the visit note. 

## 2015-05-25 NOTE — Progress Notes (Addendum)
HPI:  TAKYLAH [name] is here to establish care. Used to see [facility]. Most of her care is currently with specialists. Sees gyn and dermatology.   Has the following chronic problems that require follow up and concerns today:  A. Fibrillation: -managed by her cardiologist -not on anticoagulation -meds:asa, flecainide, taztia -CHADSVASC score 2-3 (? Hx hypertension - on meds for a long time for this and venous insufficiency) HASBLED 1  GERD: -on protonix 40 mg daily, reports always gets bad reflux if decreases -no hx bleeding, stricture, hiatal hernia -last colonoscopy April 2013, hx colon polyp so reports told to have another in 5 years  Hx Melanoma: -Seeing Dr. Renda Rolls in dermatology  Hx of Uterine Ca: -s/p complete hysterectomy and oophorectomy -sees Dr. Ronita Hipps for female and breast health  ROS negative for unless reported above: fevers, unintentional weight loss, hearing or vision loss, chest pain, palpitations, struggling to breath, hemoptysis, melena, hematochezia, hematuria, falls, loc, si, thoughts of self harm  Past Medical History  Diagnosis Date  . Atrial fibrillation ([place])   . Melanoma [facility])     sees Dr. Renda Rolls in dermatology  . Venous insufficiency     s/p ablation  . Scoliosis   . Difficult intubation     pt states that her neck needs to be in a neutral position,  scoliosis  . PONV (postoperative nausea and vomiting)   . Family history of adverse reaction to anesthesia     nausea  . Peripheral vascular disease ([place])   . Thyroid nodule   . Urinary incontinence   . GERD (gastroesophageal reflux disease)     protonix, hx h. pylori  . Arthritis   . History of uterine cancer 04/22/2014    sees Dr. Ronita Hipps; s/p complete hysterectomy  . S/P hip replacement   . Hx of colonic polyp   . Recurrent UTI     Past Surgical History  Procedure Laterality Date  . Abdominal hysterectomy  09/10/2013  . Melanoma excision  12/2011  . Vein surgery  05/2011     venous ablation   . Bunionectomy  10/1976  . Fracture surgery  09/1964  . Total hip arthroplasty Left 06/17/2014    Procedure: LEFT TOTAL HIP ARTHROPLASTY ANTERIOR APPROACH;  Surgeon: Mcarthur Rossetti, MD;  Location: [place];  Service: Orthopedics;  Laterality: Left;  . Colonoscopy    . Total hip arthroplasty Right 09/02/2014    Procedure: RIGHT TOTAL HIP ARTHROPLASTY ANTERIOR APPROACH;  Surgeon: Mcarthur Rossetti, MD;  Location: [place];  Service: Orthopedics;  Laterality: Right;    Family History  Problem Relation Age of Onset  . Uterine cancer Mother   . Diabetes Brother 19  . Hypertension Father 25  . Hyperlipidemia Father   . Cancer Father 41  . Epilepsy Father 54  . Arthritis Sister 71  . Cancer Maternal Grandmother   . Stroke Paternal Grandfather   . Arthritis Sister     Social History   Social History  . Marital Status: Divorced    Spouse Name: N/A  . Number of Children: 2  . Years of Education: JD   Occupational History  . lawyer    Social History Main Topics  . Smoking status: Never Smoker   . Smokeless tobacco: Never Used  . Alcohol Use: 0.0 oz/week    0 Standard drinks or equivalent per week     Comment: rarely  . Drug Use: No  . Sexual Activity: Not Asked   Other Topics Concern  .  None   Social History Narrative   Work or School: retired Tour manager Situation: lives alone - takes care of twin grandchildren 7 months in 05/2015      Spiritual Beliefs:       Lifestyle: active, healthy diet           Current outpatient prescriptions:  .  aspirin EC 325 MG EC tablet, Take 1 tablet (325 mg total) by mouth 2 (two) times daily after a meal., Disp: 40 tablet, Rfl: 0 .  Calcium Carb-Cholecalciferol (CALCIUM-VITAMIN D) 600-400 MG-UNIT TABS, Take 1 tablet by mouth 2 (two) times daily., Disp: , Rfl:  .  flecainide (TAMBOCOR) 50 MG tablet, Take 1.5 tablets (75 mg total) by mouth 2 (two) times daily., Disp: 270 tablet, Rfl: 3 .  pantoprazole  (PROTONIX) 40 MG tablet, Take 40 mg by mouth daily. , Disp: , Rfl: 1 .  senna (SENOKOT) 8.6 MG tablet, Take 1 tablet by mouth daily., Disp: , Rfl:  .  spironolactone (ALDACTONE) 50 MG tablet, Take 50-100 mg by mouth 2 (two) times daily. 100 mg am and 50 mg pm, Disp: , Rfl:  .  TAZTIA XT 240 MG 24 hr capsule, TAKE ONE CAPSULE BY MOUTH EVERY DAY, Disp: 90 capsule, Rfl: 2 .  triamcinolone cream (KENALOG) 0.1 %, Apply 1 application topically as needed (for dry skin). , Disp: , Rfl: 1  EXAM:  Filed Vitals:   05/25/15 1110  BP: 120/70  Pulse: 75  Temp: 97.3 F (36.3 C)    Body mass index is 32.97 kg/(m^2).  GENERAL: vitals reviewed and listed above, alert, oriented, appears well hydrated and in no acute distress  HEENT: atraumatic, conjunttiva clear, no obvious abnormalities on inspection of external nose and ears  NECK: no obvious masses on inspection  LUNGS: clear to auscultation bilaterally, no wheezes, rales or rhonchi, good air movement  CV: HRRR, bilat R>L LE edema with hyperpigmentation of skin in ankles  MS: moves all extremities without noticeable abnormality  PSYCH: pleasant and cooperative, no obvious depression or anxiety  ASSESSMENT AND PLAN:  30 minutes spent face to face with this patient with over 50% of the time in counseling. Discussed the following assessment and plan:  Gastroesophageal reflux disease, esophagitis presence not specified -on high dose PPI -advised trail lower dose PPI, reports she did not tolerate stopping this medication  PAF (paroxysmal atrial fibrillation) ([place]) -managed by cardiology, discussed risks of stroke, risks with anticoagulation -she is currently adamantly against anticoagulation, she will plan to discuss further with cardiology  Hx of melanoma of skin -managed by dermatologist  History of uterine cancer -sees gyn for women's and breast health  Venous insufficiency: -did not tolerated diuretics per her report -advised  compression and elevation  Encounter to establish care with new doctor -We reviewed the PMH, [place], FH, SH, Meds and Allergies. -We provided refills for any medications we will prescribe as needed. -We addressed current concerns per orders and patient instructions. -We have asked for records for pertinent exams, studies, vaccines and notes from previous providers. -We have advised patient to follow up per instructions below.   -Patient advised to return or notify a doctor immediately if symptoms worsen or persist or new concerns arise.  Patient Instructions  BEFORE YOU LEAVE: -schedule medicare annual wellness visit - come fasting and will plan to do labs that day  Please schedule follow up with your cardiology.  Consider medium grade compression daily. Elevation of legs.  Consider  half dose of protonix if tolerated.  We recommend the following healthy lifestyle measures: - eat a healthy whole foods diet consisting of regular small meals composed of vegetables, fruits, beans, nuts, seeds, healthy meats such as white chicken and fish and whole grains.  - avoid sweets, white starchy foods, fried foods, fast food, processed foods, sodas, red meet and other fattening foods.  - get a least 150-300 minutes of aerobic exercise per week.       Colin Benton R.

## 2015-05-25 NOTE — Patient Instructions (Addendum)
BEFORE YOU LEAVE: -schedule medicare annual wellness visit - come fasting and will plan to do labs that day  Please schedule follow up with your cardiology.  Consider medium grade compression daily. Elevation of legs.  Consider half dose of protonix if tolerated.  We recommend the following healthy lifestyle measures: - eat a healthy whole foods diet consisting of regular small meals composed of vegetables, fruits, beans, nuts, seeds, healthy meats such as white chicken and fish and whole grains.  - avoid sweets, white starchy foods, fried foods, fast food, processed foods, sodas, red meet and other fattening foods.  - get a least 150-300 minutes of aerobic exercise per week.

## 2015-06-10 ENCOUNTER — Encounter: Payer: Self-pay | Admitting: Family Medicine

## 2015-06-10 DIAGNOSIS — E041 Nontoxic single thyroid nodule: Secondary | ICD-10-CM | POA: Insufficient documentation

## 2015-06-10 DIAGNOSIS — Z85820 Personal history of malignant melanoma of skin: Secondary | ICD-10-CM | POA: Insufficient documentation

## 2015-06-27 ENCOUNTER — Other Ambulatory Visit: Payer: Self-pay | Admitting: Internal Medicine

## 2015-06-29 NOTE — Telephone Encounter (Signed)
Rx(s) sent to pharmacy electronically.  

## 2015-07-03 ENCOUNTER — Telehealth: Payer: Self-pay | Admitting: Family Medicine

## 2015-07-03 DIAGNOSIS — C541 Malignant neoplasm of endometrium: Secondary | ICD-10-CM | POA: Diagnosis not present

## 2015-07-03 DIAGNOSIS — C55 Malignant neoplasm of uterus, part unspecified: Secondary | ICD-10-CM | POA: Diagnosis not present

## 2015-07-03 NOTE — Telephone Encounter (Signed)
Pt request refill of the following:  pantoprazole (PROTONIX) 40 MG tablet  Dr Maudie Mercury has never rx this med     Phamacy:   [address] at [facility]

## 2015-07-03 NOTE — Telephone Encounter (Signed)
Please call patient. At her appointment we talked about trying a lower dose of this medication. Please see if she is okay with trying 20 mg daily to see how this goes? Can always increase back to 40 if needed. Can then 90 days with one refill.

## 2015-07-03 NOTE — Telephone Encounter (Signed)
Pt called again for Rx pantoprazole (Protonix)

## 2015-07-06 MED ORDER — PANTOPRAZOLE SODIUM 40 MG PO TBEC: 40.0000 mg | DELAYED_RELEASE_TABLET | Freq: Every day | ORAL | Status: DC

## 2015-07-06 NOTE — Telephone Encounter (Signed)
I called the pt and informed her of the message below and she stated she does not feel she can take 20mg  as she has been doubling the dose and this did work for her.  I advised her I will forward this to Dr Maudie Mercury.  States it is OK to leave a message on her voicemail if she does not answer.

## 2015-07-06 NOTE — Telephone Encounter (Signed)
I left a detailed message with the info below at the pts cell number. 

## 2015-07-06 NOTE — Telephone Encounter (Signed)
Okay. Prescription sent per her request.

## 2015-07-14 ENCOUNTER — Encounter: Payer: Self-pay | Admitting: Family Medicine

## 2015-07-14 ENCOUNTER — Ambulatory Visit (INDEPENDENT_AMBULATORY_CARE_PROVIDER_SITE_OTHER): Payer: Medicare Other | Admitting: Family Medicine

## 2015-07-14 VITALS — BP 130/80 | HR 76 | Temp 98.4°F | Wt 232.0 lb

## 2015-07-14 DIAGNOSIS — R300 Dysuria: Secondary | ICD-10-CM

## 2015-07-14 LAB — POCT URINALYSIS DIPSTICK
Bilirubin, UA: NEGATIVE
Blood, UA: NEGATIVE
Glucose, UA: NEGATIVE
KETONES UA: NEGATIVE
Nitrite, UA: POSITIVE
Protein, UA: NEGATIVE
SPEC GRAV UA: 1.015
Urobilinogen, UA: 0.2
pH, UA: 6.5

## 2015-07-14 MED ORDER — NITROFURANTOIN MONOHYD MACRO 100 MG PO CAPS: 100.0000 mg | ORAL_CAPSULE | Freq: Two times a day (BID) | ORAL | Status: DC

## 2015-07-14 NOTE — Addendum Note (Signed)
Addended by: Westley Hummer B on: 07/14/2015 12:07 PM   Modules accepted: Miquel Dunn

## 2015-07-14 NOTE — Addendum Note (Signed)
Addended by: Gari Crown D on: 07/14/2015 12:28 PM   Modules accepted: Orders

## 2015-07-14 NOTE — Patient Instructions (Signed)
Please pick up the antibiotic and use as instructed.  Follow up as needed if symptoms worsen or persist despite treatment.

## 2015-07-14 NOTE — Progress Notes (Signed)
HPI:  [name] is a pleasant 65 yo here for an acute visit for:  Dysuria: -started 4-5 days ago -hx recurrent UTIs, reports seeing Dr. Louis Meckel next month -symptoms: pressure after urination, frequency -has a cold currently too with nasal congestion, drainage, cough -denies: fevers, chills, abd pain or flank pain, hematuria, nausea, vomiting, vaginal discharge -Reports has tolerated Macrobid well in the past and prefers this, no Cipro due to her other medications and interactions  ROS: See pertinent positives and negatives per HPI.  Past Medical History  Diagnosis Date  . Atrial fibrillation ([place])   . Melanoma [facility])     sees Dr. Renda Rolls in dermatology  . Venous insufficiency     s/p ablation  . Scoliosis   . Difficult intubation     pt states that her neck needs to be in a neutral position,  scoliosis  . PONV (postoperative nausea and vomiting)   . Family history of adverse reaction to anesthesia     nausea  . Peripheral vascular disease ([place])   . Thyroid nodule   . Urinary incontinence   . GERD (gastroesophageal reflux disease)     protonix, hx h. pylori  . Arthritis   . History of uterine cancer 04/22/2014    sees Dr. Ronita Hipps; s/p complete hysterectomy  . S/P hip replacement   . Hx of colonic polyp   . Recurrent UTI   . 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 Osteoarthritis, hip, bilateral 03/25/2014    Bilateral, severe joint space narrowing, demonstrated on x-ray December 2015     Past Surgical History  Procedure Laterality Date  . Abdominal hysterectomy  09/10/2013  . Melanoma excision  12/2011  . Vein surgery  05/2011    venous ablation   . Bunionectomy  10/1976  . Fracture surgery  09/1964  . Total hip arthroplasty Left 06/17/2014    Procedure: LEFT TOTAL HIP ARTHROPLASTY ANTERIOR APPROACH;  Surgeon: Mcarthur Rossetti, MD;  Location: [place];  Service: Orthopedics;  Laterality: Left;  . Colonoscopy    . Total hip arthroplasty Right 09/02/2014   Procedure: RIGHT TOTAL HIP ARTHROPLASTY ANTERIOR APPROACH;  Surgeon: Mcarthur Rossetti, MD;  Location: [place];  Service: Orthopedics;  Laterality: Right;    Family History  Problem Relation Age of Onset  . Uterine cancer Mother   . Diabetes Brother 35  . Hypertension Father 15  . Hyperlipidemia Father   . Cancer Father 31  . Epilepsy Father 20  . Arthritis Sister 43  . Cancer Maternal Grandmother   . Stroke Paternal Grandfather   . Arthritis Sister     Social History   Social History  . Marital Status: Divorced    Spouse Name: N/A  . Number of Children: 2  . Years of Education: JD   Occupational History  . lawyer    Social History Main Topics  . Smoking status: Never Smoker   . Smokeless tobacco: Never Used  . Alcohol Use: 0.0 oz/week    0 Standard drinks or equivalent per week     Comment: rarely  . Drug Use: No  . Sexual Activity: Not Asked   Other Topics Concern  . None   Social History Narrative   Work or School: retired Tour manager Situation: lives alone - takes care of twin grandchildren 7 months in 05/2015      Spiritual Beliefs:       Lifestyle: active, healthy diet  Current outpatient prescriptions:  .  aspirin EC 325 MG EC tablet, Take 1 tablet (325 mg total) by mouth 2 (two) times daily after a meal., Disp: 40 tablet, Rfl: 0 .  Calcium Carb-Cholecalciferol (CALCIUM-VITAMIN D) 600-400 MG-UNIT TABS, Take 1 tablet by mouth 2 (two) times daily., Disp: , Rfl:  .  flecainide (TAMBOCOR) 50 MG tablet, Take 1.5 tablets (75 mg total) by mouth 2 (two) times daily., Disp: 270 tablet, Rfl: 1 .  pantoprazole (PROTONIX) 40 MG tablet, Take 1 tablet (40 mg total) by mouth daily., Disp: 90 tablet, Rfl: 1 .  senna (SENOKOT) 8.6 MG tablet, Take 1 tablet by mouth daily., Disp: , Rfl:  .  spironolactone (ALDACTONE) 50 MG tablet, Take 50-100 mg by mouth 2 (two) times daily. 100 mg am and 50 mg pm, Disp: , Rfl:  .  TAZTIA XT 240 MG 24 hr capsule, TAKE  ONE CAPSULE BY MOUTH EVERY DAY, Disp: 90 capsule, Rfl: 2 .  triamcinolone cream (KENALOG) 0.1 %, Apply 1 application topically as needed (for dry skin). , Disp: , Rfl: 1 .  nitrofurantoin, macrocrystal-monohydrate, (MACROBID) 100 MG capsule, Take 1 capsule (100 mg total) by mouth 2 (two) times daily., Disp: 14 capsule, Rfl: 0  EXAM:  Filed Vitals:   07/14/15 1137  BP: 130/80  Pulse: 76  Temp: 98.4 F (36.9 C)    Body mass index is 33.29 kg/(m^2).  GENERAL: vitals reviewed and listed above, alert, oriented, appears well hydrated and in no acute distress  HEENT: atraumatic, conjunttiva clear, no obvious abnormalities on inspection of external nose and ears, normal appearance of ear canals and TMs, clear nasal congestion, mild post oropharyngeal erythema with PND, no tonsillar edema or exudate, no sinus TTP  NECK: no obvious masses on inspection  LUNGS: clear to auscultation bilaterally, no wheezes, rales or rhonchi, good air movement  CV: HRRR, no peripheral edema  ABD: BS+, soft, nttp  MS: moves all extremities without noticeable abnormality  PSYCH: pleasant and cooperative, no obvious depression or anxiety  ASSESSMENT AND PLAN:  Discussed the following assessment and plan:  Dysuria - Plan: POC Urinalysis Dipstick  -U dip with leuks and nitrites, suspect UTI and she opted to start nitrofurantoin after discussion risks benefits, culture pending -Patient advised to return or notify a doctor immediately if symptoms worsen or persist or new concerns arise.  Patient Instructions  Please pick up the antibiotic and use as instructed.  Follow up as needed if symptoms worsen or persist despite treatment.     Colin Benton R.

## 2015-07-14 NOTE — Progress Notes (Signed)
Pre visit review using our clinic review tool, if applicable. No additional management support is needed unless otherwise documented below in the visit note. 

## 2015-07-17 LAB — URINE CULTURE

## 2015-07-20 DIAGNOSIS — M1611 Unilateral primary osteoarthritis, right hip: Secondary | ICD-10-CM | POA: Diagnosis not present

## 2015-07-20 DIAGNOSIS — M1612 Unilateral primary osteoarthritis, left hip: Secondary | ICD-10-CM | POA: Diagnosis not present

## 2015-07-20 DIAGNOSIS — M25562 Pain in left knee: Secondary | ICD-10-CM | POA: Diagnosis not present

## 2015-08-11 ENCOUNTER — Ambulatory Visit (INDEPENDENT_AMBULATORY_CARE_PROVIDER_SITE_OTHER): Payer: Medicare Other | Admitting: Adult Health

## 2015-08-11 ENCOUNTER — Encounter: Payer: Self-pay | Admitting: Adult Health

## 2015-08-11 VITALS — BP 128/70 | Temp 98.5°F | Ht 70.0 in | Wt 231.0 lb

## 2015-08-11 DIAGNOSIS — J029 Acute pharyngitis, unspecified: Secondary | ICD-10-CM | POA: Diagnosis not present

## 2015-08-11 LAB — POCT RAPID STREP A (OFFICE): RAPID STREP A SCREEN: NEGATIVE

## 2015-08-11 NOTE — Progress Notes (Signed)
   Subjective:    Patient ID: [name], female    DOB: Aug 25, 1950, 65 y.o.   MRN: [id number]  Sore Throat  This is a new problem. The current episode started yesterday. The problem has been gradually worsening. There has been no fever. The pain is at a severity of 6/10. The pain is moderate. Associated symptoms include ear pain, swollen glands and trouble swallowing (sharp ). Pertinent negatives include no congestion, coughing or ear discharge. She has had exposure to strep. She has tried NSAIDs for the symptoms.     Review of Systems  HENT: Positive for ear pain and trouble swallowing (sharp ). Negative for congestion and ear discharge.   Respiratory: Negative for cough.     BP 128/70 mmHg  Temp(Src) 98.5 F (36.9 C) (Oral)  Ht 5\' 10"  (1.778 m)  Wt 231 lb (104.781 kg)  BMI 33.15 kg/m2       Objective:   Physical Exam  Constitutional: She is oriented to person, place, and time. She appears well-developed and well-nourished. No distress.  HENT:  Head: Normocephalic and atraumatic.  Right Ear: External ear normal.  Left Ear: External ear normal.  Nose: Nose normal.  Mouth/Throat: Uvula is midline and mucous membranes are normal. Posterior oropharyngeal erythema present. No oropharyngeal exudate, posterior oropharyngeal edema or tonsillar abscesses.  Eyes: Conjunctivae and EOM are normal. Pupils are equal, round, and reactive to light. Right eye exhibits no discharge. Left eye exhibits no discharge.  Neck: Normal range of motion. Neck supple. No thyromegaly present.  Cardiovascular: Normal rate, regular rhythm, normal heart sounds and intact distal pulses.  Exam reveals no gallop and no friction rub.   No murmur heard. Pulmonary/Chest: Effort normal and breath sounds normal. No respiratory distress. She has no wheezes. She has no rales. She exhibits no tenderness.  Lymphadenopathy:    She has no cervical adenopathy.  Neurological: She is alert and oriented to person, place,  and time.  Skin: Skin is warm and dry. No rash noted. She is not diaphoretic. No erythema. No pallor.  Psychiatric: She has a normal mood and affect. Her behavior is normal. Judgment and thought content normal.  Nursing note and vitals reviewed.     Assessment & Plan:  1. Sore throat - POC Rapid Strep A- Negative  - Culture, Group A Strep - Advised NSAIDS, warm salt water gargles.  - Follow up if no improvement - Did not want magic mouthwash with lidocaine  - Dorothyann Peng, NP

## 2015-08-11 NOTE — Patient Instructions (Addendum)
It was great meeting you today!  Your strep test was negative. I will send a culture to make sure. Likely this viral in nature and will pass within a few days.   Follow up with PCP if no improvement

## 2015-08-12 ENCOUNTER — Telehealth: Payer: Self-pay | Admitting: Family Medicine

## 2015-08-12 NOTE — Telephone Encounter (Signed)
Pt would like throat culture results. Pt saw cory yesterday

## 2015-08-13 ENCOUNTER — Other Ambulatory Visit: Payer: Self-pay | Admitting: Adult Health

## 2015-08-13 LAB — CULTURE, GROUP A STREP

## 2015-08-13 MED ORDER — PENICILLIN V POTASSIUM 500 MG PO TABS
500.0000 mg | ORAL_TABLET | Freq: Three times a day (TID) | ORAL | Status: DC
Start: 2015-08-13 — End: 2015-09-07

## 2015-08-13 NOTE — Telephone Encounter (Signed)
Patient notified of culture results. Patient verbalized understanding.

## 2015-08-26 ENCOUNTER — Encounter: Payer: Medicare Other | Admitting: Family Medicine

## 2015-08-31 DIAGNOSIS — N3020 Other chronic cystitis without hematuria: Secondary | ICD-10-CM | POA: Diagnosis not present

## 2015-08-31 DIAGNOSIS — N3941 Urge incontinence: Secondary | ICD-10-CM | POA: Diagnosis not present

## 2015-08-31 DIAGNOSIS — R102 Pelvic and perineal pain: Secondary | ICD-10-CM | POA: Diagnosis not present

## 2015-09-07 ENCOUNTER — Encounter: Payer: Self-pay | Admitting: Family Medicine

## 2015-09-07 ENCOUNTER — Ambulatory Visit (INDEPENDENT_AMBULATORY_CARE_PROVIDER_SITE_OTHER): Payer: Medicare Other | Admitting: Family Medicine

## 2015-09-07 VITALS — BP 116/70 | HR 81 | Temp 97.8°F | Ht 69.25 in | Wt 228.7 lb

## 2015-09-07 DIAGNOSIS — K219 Gastro-esophageal reflux disease without esophagitis: Secondary | ICD-10-CM | POA: Diagnosis not present

## 2015-09-07 DIAGNOSIS — Z85820 Personal history of malignant melanoma of skin: Secondary | ICD-10-CM

## 2015-09-07 DIAGNOSIS — N3281 Overactive bladder: Secondary | ICD-10-CM

## 2015-09-07 DIAGNOSIS — R739 Hyperglycemia, unspecified: Secondary | ICD-10-CM

## 2015-09-07 DIAGNOSIS — E785 Hyperlipidemia, unspecified: Secondary | ICD-10-CM | POA: Diagnosis not present

## 2015-09-07 DIAGNOSIS — Z0000 Encounter for general adult medical examination without abnormal findings: Secondary | ICD-10-CM

## 2015-09-07 DIAGNOSIS — I480 Paroxysmal atrial fibrillation: Secondary | ICD-10-CM

## 2015-09-07 DIAGNOSIS — I872 Venous insufficiency (chronic) (peripheral): Secondary | ICD-10-CM

## 2015-09-07 LAB — LIPID PANEL
CHOLESTEROL: 137 mg/dL (ref 0–200)
HDL: 39.8 mg/dL (ref >39.00)
LDL Cholesterol: 87 mg/dL (ref 0–99)
NonHDL: 97.35
TRIGLYCERIDES: 53 mg/dL (ref 0.0–149.0)
Total CHOL/HDL Ratio: 3
VLDL: 10.6 mg/dL (ref 0.0–40.0)

## 2015-09-07 LAB — BASIC METABOLIC PANEL
BUN: 20 mg/dL (ref 6–23)
CALCIUM: 9.4 mg/dL (ref 8.4–10.5)
CO2: 26 meq/L (ref 19–32)
CREATININE: 0.81 mg/dL (ref 0.40–1.20)
Chloride: 100 mEq/L (ref 96–112)
GFR: 75.08 mL/min (ref >60.00)
GLUCOSE: 87 mg/dL (ref 70–99)
Potassium: 4.1 mEq/L (ref 3.5–5.1)
Sodium: 134 mEq/L — ABNORMAL LOW (ref 135–145)

## 2015-09-07 LAB — CBC
HCT: 40.8 % (ref 36.0–46.0)
Hemoglobin: 13.8 g/dL (ref 12.0–15.0)
MCHC: 33.9 g/dL (ref 30.0–36.0)
MCV: 100.7 fl — AB (ref 78.0–100.0)
PLATELETS: 246 10*3/uL (ref 150.0–400.0)
RBC: 4.05 Mil/uL (ref 3.87–5.11)
RDW: 12.8 % (ref 11.5–15.5)
WBC: 5.2 10*3/uL (ref 4.0–10.5)

## 2015-09-07 LAB — HEMOGLOBIN A1C: Hgb A1c MFr Bld: 5.2 % (ref 4.6–6.5)

## 2015-09-07 NOTE — Patient Instructions (Signed)
BEFORE YOU LEAVE: -labs -follow up in 6 months  Please check to see if you want to get the shingles vaccine and let us know.  Please let us know which pneumonia vaccine you had and when.  Plan to get your mammogram and you bone density test with your gynecologist. Let us know if you need help with these tests otherwise.  We recommend the following healthy lifestyle measures: - eat a healthy whole foods diet consisting of regular small meals composed of vegetables, fruits, beans, nuts, seeds, healthy meats such as white chicken and fish and whole grains.  - avoid sweets, white starchy foods, fried foods, fast food, processed foods, sodas, red meet and other fattening foods.  - get a least 150-300 minutes of aerobic exercise per week.   We have ordered labs or studies at this visit. It can take up to 1-2 weeks for results and processing. IF results require follow up or explanation, we will call you with instructions. Clinically stable results will be released to your [facility]. If you have not heard from [place] or cannot find your results in [facility] in 2 weeks please contact our office at [phone].  If you are not yet signed up for [facility], please consider signing up.

## 2015-09-07 NOTE — Progress Notes (Signed)
Pre visit review using our clinic review tool, if applicable. No additional management support is needed unless otherwise documented below in the visit note. 

## 2015-09-07 NOTE — Progress Notes (Signed)
Medicare Annual Preventive Care Visit  (initial annual wellness or annual wellness exam)  Has the following chronic problems that require follow up and concerns today:  A. Fibrillation: -managed by her cardiologist -not on anticoagulation -meds:asa, flecainide, taztia -take spironolactone for edema, started by vasc per her report  GERD: -on protonix 40 mg daily, reports always gets bad reflux if decreases -no hx bleeding, stricture, hiatal hernia -last colonoscopy April 2013, hx colon polyp so reports told to have another in 5 years  Hx Melanoma: -Seeing Dr. Renda Rolls in dermatology  Hx of Uterine Ca: -s/p complete hysterectomy and oophorectomy -sees Dr. Ronita Hipps for female and breast health  Dysuria/Recurrent UTIs: -seeing urologist; Seeing Dr. Louis Meckel -started myrbetric -doing better in terms of symptoms -had not tolerated medications in the past   ROS: negative for report of fevers, unintentional weight loss, vision changes, vision loss, hearing loss or change, chest pain, sob, hemoptysis, melena, hematochezia, hematuria, genital discharge or lesions, falls, bleeding or bruising, loc, thoughts of suicide or self harm, memory loss  1.) Patient-completed health risk assessment  - completed and reviewed, see scanned documentation  2.) Review of Medical History: -PMH, PSH, Family History and current specialty and care providers reviewed and updated and listed below  - see scanned in document in chart and below  Past Medical History  Diagnosis Date  . Atrial fibrillation ([place])   . Melanoma [facility])     sees Dr. Renda Rolls in dermatology  . Venous insufficiency     s/p ablation  . Scoliosis   . Difficult intubation     pt states that her neck needs to be in a neutral position,  scoliosis  . PONV (postoperative nausea and vomiting)   . Family history of adverse reaction to anesthesia     nausea  . Peripheral vascular disease ([place])   . Thyroid nodule   . Urinary  incontinence   . GERD (gastroesophageal reflux disease)     protonix, hx h. pylori  . Arthritis   . History of uterine cancer 04/22/2014    sees Dr. Ronita Hipps; s/p complete hysterectomy  . S/P hip replacement   . Hx of colonic polyp   . Recurrent UTI   . 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 Recurrent UTI   . Constipation     Past Surgical History  Procedure Laterality Date  . Abdominal hysterectomy  09/10/2013  . Melanoma excision  12/2011  . Vein surgery  05/2011    venous ablation   . Bunionectomy  10/1976  . Fracture surgery  09/1964  . Total hip arthroplasty Left 06/17/2014    Procedure: LEFT TOTAL HIP ARTHROPLASTY ANTERIOR APPROACH;  Surgeon: Mcarthur Rossetti, MD;  Location: [place];  Service: Orthopedics;  Laterality: Left;  . Colonoscopy    . Total hip arthroplasty Right 09/02/2014    Procedure: RIGHT TOTAL HIP ARTHROPLASTY ANTERIOR APPROACH;  Surgeon: Mcarthur Rossetti, MD;  Location: [place];  Service: Orthopedics;  Laterality: Right;    Social History   Social History  . Marital Status: Divorced    Spouse Name: N/A  . Number of Children: 2  . Years of Education: JD   Occupational History  . lawyer    Social History Main Topics  . Smoking status: Never Smoker   . Smokeless tobacco: Never Used  . Alcohol Use: 0.0 oz/week    0 Standard drinks  or equivalent per week     Comment: rarely  . Drug Use: No  . Sexual Activity: Not on file   Other Topics Concern  . Not on file   Social History Narrative   Work or School: retired Forensic psychologist      Home Situation: lives alone - takes care of twin grandchildren 7 months in 05/2015      Spiritual Beliefs:       Lifestyle: active, healthy diet          Family History  Problem Relation Age of Onset  . Uterine cancer Mother   . Diabetes Brother 60  . Hypertension Father 97  .  Hyperlipidemia Father   . Cancer Father 29  . Epilepsy Father 69  . Arthritis Sister 50  . Cancer Maternal Grandmother   . Stroke Paternal Grandfather   . Arthritis Sister     Current Outpatient Prescriptions on File Prior to Visit  Medication Sig Dispense Refill  . aspirin EC 325 MG EC tablet Take 1 tablet (325 mg total) by mouth 2 (two) times daily after a meal. 40 tablet 0  . Calcium Carb-Cholecalciferol (CALCIUM-VITAMIN D) 600-400 MG-UNIT TABS Take 1 tablet by mouth 2 (two) times daily.    . flecainide (TAMBOCOR) 50 MG tablet Take 1.5 tablets (75 mg total) by mouth 2 (two) times daily. 270 tablet 1  . pantoprazole (PROTONIX) 40 MG tablet Take 1 tablet (40 mg total) by mouth daily. 90 tablet 1  . senna (SENOKOT) 8.6 MG tablet Take 1 tablet by mouth as needed.     Marland Kitchen spironolactone (ALDACTONE) 50 MG tablet Take 50-100 mg by mouth 2 (two) times daily. 100 mg am and 50 mg pm    . TAZTIA XT 240 MG 24 hr capsule TAKE ONE CAPSULE BY MOUTH EVERY DAY 90 capsule 2  . triamcinolone cream (KENALOG) 0.1 % Apply 1 application topically as needed (for dry skin).   1   No current facility-administered medications on file prior to visit.     3.) Review of functional ability and level of safety:  Any difficulty hearing?  NO, very mild in crowded room - o/w normal  History of falling?  NO  Any trouble with IADLs - using a phone, using transportation, grocery shopping, preparing meals, doing housework, doing laundry, taking medications and managing money? NO  Advance Directives? YES - utd  See summary of recommendations in Patient Instructions below.  4.) Physical Exam Filed Vitals:   09/07/15 1106  BP: 116/70  Pulse: 81  Temp: 97.8 F (36.6 C)   Estimated body mass index is 33.53 kg/(m^2) as calculated from the following:   Height as of this encounter: 5' 9.25" (1.759 m).   Weight as of this encounter: 228 lb 11.2 oz (103.738 kg).  EKG (optional): deferred  General: alert,  appear well hydrated and in no acute distress  HEENT: visual acuity grossly intact  CV: HRRR today, wearing compression socks  Lungs: CTA bilaterally  Psych: pleasant and cooperative, no obvious depression or anxiety  Cognitive function grossly intact  See patient instructions for recommendations.  Education and counseling regarding the above review of health provided with a plan for the following: -see scanned patient completed form for further details -fall prevention strategies discussed  -healthy lifestyle discussed -importance and resources for completing advanced directives discussed -see patient instructions below for any other recommendations provided  4)The following written screening schedule of preventive measures were reviewed with assessment and plan made per below,  orders and patient instructions:           Alcohol screening done     Obesity Screening and counseling done     STI screening (Hep C if born [birth year]) offered and per pt wishes     Tobacco Screening done        Pneumococcal (PPSV23 -one dose after 64, one before if risk factors), influenza yearly and hepatitis B vaccines (if high risk - end stage renal disease, IV drugs, homosexual men, live in home for mentally retarded, hemophilia receiving factors) ASSESSMENT/PLAN: she thinks done - she plans to check on it and obtain record      Screening mammograph (yearly if >40) ASSESSMENT/PLAN: scheduled for later this month      Screening Pap smear/pelvic exam (q2 years) ASSESSMENT/PLAN: sees Dr. Ronita Hipps      Colorectal cancer screening (FOBT yearly or flex sig q4y or colonoscopy q10y or barium enema q4y) ASSESSMENT/PLAN: done in 06/2011 [facility] - father had colon ca - reports told needed repeat in 5 years      Diabetes outpatient self-management training services ASSESSMENT/PLAN: utd or done      Bone mass measurements(covered q2y if indicated - estrogen def, osteoporosis,  hyperparathyroid, vertebral abnormalities, osteoporosis or steroids) ASSESSMENT/PLAN: reports bone density done in 2014; sees  Dr. Ronita Hipps and plans to ask there about this      Screening for glaucoma(q1y if high risk - diabetes, FH, AA and > 28 or hispanic and > 65) ASSESSMENT/PLAN: utd or advised - she plans to do an eye exam      Medical nutritional therapy for individuals with diabetes or renal disease ASSESSMENT/PLAN: see orders      Cardiovascular screening blood tests (lipids q5y) ASSESSMENT/PLAN: see orders and labs      Diabetes screening tests ASSESSMENT/PLAN: see orders and labs   7.) Summary: -risk factors and conditions per above assessment were discussed and treatment, recommendations and referrals were offered per documentation above and orders and patient instructions.  Medicare annual wellness visit, subsequent  PAF (paroxysmal atrial fibrillation) ([place]) - Plan: Basic metabolic panel, CBC (no diff)  Gastroesophageal reflux disease, esophagitis presence not specified  History of melanoma  Hyperlipemia - Plan: Lipid Panel  Hyperglycemia - Plan: Hemoglobin A1c  Chronic venous insufficiency  OAB (overactive bladder)  Patient Instructions  BEFORE YOU LEAVE: -labs -follow up in 6 months  Please check to see if you want to get the shingles vaccine and let us know.  Please let us know which pneumonia vaccine you had and when.  Plan to get your mammogram and you bone density test with your gynecologist. Let us know if you need help with these tests otherwise.  We recommend the following healthy lifestyle measures: - eat a healthy whole foods diet consisting of regular small meals composed of vegetables, fruits, beans, nuts, seeds, healthy meats such as white chicken and fish and whole grains.  - avoid sweets, white starchy foods, fried foods, fast food, processed foods, sodas, red meet and other fattening foods.  - get a least 150-300 minutes of aerobic  exercise per week.   We have ordered labs or studies at this visit. It can take up to 1-2 weeks for results and processing. IF results require follow up or explanation, we will call you with instructions. Clinically stable results will be released to your [facility]. If you have not heard from [place] or cannot find your results in [facility] in 2 weeks please contact our office at  [phone].  If you are not yet signed up for [facility], please consider signing up.

## 2015-09-14 DIAGNOSIS — M6281 Muscle weakness (generalized): Secondary | ICD-10-CM | POA: Diagnosis not present

## 2015-09-14 DIAGNOSIS — N3941 Urge incontinence: Secondary | ICD-10-CM | POA: Diagnosis not present

## 2015-09-14 DIAGNOSIS — R278 Other lack of coordination: Secondary | ICD-10-CM | POA: Diagnosis not present

## 2015-09-14 DIAGNOSIS — N3020 Other chronic cystitis without hematuria: Secondary | ICD-10-CM | POA: Diagnosis not present

## 2015-09-16 ENCOUNTER — Telehealth: Payer: Self-pay | Admitting: Internal Medicine

## 2015-09-16 NOTE — Telephone Encounter (Signed)
1. Type of surgery/procedure: perianal biofeedback - for pelvic floor dysfunction & urge incontinence  2. Date of surgery: n/a 3. Surgeon: n/a  4. Medications that need to be held & how long: n/a 5. Fax and/or Phone: (p) [phone] ext: [phone] Doran Durand, M. PT)      (f) [phone]

## 2015-09-16 NOTE — Telephone Encounter (Signed)
Cleared for biofeedback.  Dr. Lemmie Evens

## 2015-09-17 NOTE — Telephone Encounter (Signed)
Clearance routed via EPIC 

## 2015-09-21 DIAGNOSIS — M6281 Muscle weakness (generalized): Secondary | ICD-10-CM | POA: Diagnosis not present

## 2015-09-21 DIAGNOSIS — R278 Other lack of coordination: Secondary | ICD-10-CM | POA: Diagnosis not present

## 2015-09-21 DIAGNOSIS — N3941 Urge incontinence: Secondary | ICD-10-CM | POA: Diagnosis not present

## 2015-09-28 ENCOUNTER — Ambulatory Visit: Payer: Medicare Other | Admitting: Internal Medicine

## 2015-09-28 DIAGNOSIS — C55 Malignant neoplasm of uterus, part unspecified: Secondary | ICD-10-CM | POA: Diagnosis not present

## 2015-09-28 DIAGNOSIS — Z1231 Encounter for screening mammogram for malignant neoplasm of breast: Secondary | ICD-10-CM | POA: Diagnosis not present

## 2015-09-28 DIAGNOSIS — M6281 Muscle weakness (generalized): Secondary | ICD-10-CM | POA: Diagnosis not present

## 2015-09-28 DIAGNOSIS — R278 Other lack of coordination: Secondary | ICD-10-CM | POA: Diagnosis not present

## 2015-09-28 DIAGNOSIS — N3941 Urge incontinence: Secondary | ICD-10-CM | POA: Diagnosis not present

## 2015-10-02 DIAGNOSIS — L814 Other melanin hyperpigmentation: Secondary | ICD-10-CM | POA: Diagnosis not present

## 2015-10-02 DIAGNOSIS — L723 Sebaceous cyst: Secondary | ICD-10-CM | POA: Diagnosis not present

## 2015-10-02 DIAGNOSIS — L570 Actinic keratosis: Secondary | ICD-10-CM | POA: Diagnosis not present

## 2015-10-02 DIAGNOSIS — D225 Melanocytic nevi of trunk: Secondary | ICD-10-CM | POA: Diagnosis not present

## 2015-10-02 DIAGNOSIS — D1800 Hemangioma unspecified site: Secondary | ICD-10-CM | POA: Diagnosis not present

## 2015-10-02 DIAGNOSIS — Z85820 Personal history of malignant melanoma of skin: Secondary | ICD-10-CM | POA: Diagnosis not present

## 2015-10-02 DIAGNOSIS — N3020 Other chronic cystitis without hematuria: Secondary | ICD-10-CM | POA: Diagnosis not present

## 2015-10-02 DIAGNOSIS — L821 Other seborrheic keratosis: Secondary | ICD-10-CM | POA: Diagnosis not present

## 2015-10-12 DIAGNOSIS — N3941 Urge incontinence: Secondary | ICD-10-CM | POA: Diagnosis not present

## 2015-10-12 DIAGNOSIS — M6281 Muscle weakness (generalized): Secondary | ICD-10-CM | POA: Diagnosis not present

## 2015-10-12 DIAGNOSIS — R278 Other lack of coordination: Secondary | ICD-10-CM | POA: Diagnosis not present

## 2015-10-26 DIAGNOSIS — N3946 Mixed incontinence: Secondary | ICD-10-CM | POA: Diagnosis not present

## 2015-10-26 DIAGNOSIS — M6281 Muscle weakness (generalized): Secondary | ICD-10-CM | POA: Diagnosis not present

## 2015-10-26 DIAGNOSIS — R278 Other lack of coordination: Secondary | ICD-10-CM | POA: Diagnosis not present

## 2015-11-02 ENCOUNTER — Ambulatory Visit (INDEPENDENT_AMBULATORY_CARE_PROVIDER_SITE_OTHER): Payer: Medicare Other | Admitting: Internal Medicine

## 2015-11-02 ENCOUNTER — Encounter: Payer: Self-pay | Admitting: Internal Medicine

## 2015-11-02 VITALS — BP 124/68 | HR 75 | Ht 71.0 in | Wt 230.0 lb

## 2015-11-02 DIAGNOSIS — I83893 Varicose veins of bilateral lower extremities with other complications: Secondary | ICD-10-CM | POA: Diagnosis not present

## 2015-11-02 DIAGNOSIS — I4891 Unspecified atrial fibrillation: Secondary | ICD-10-CM | POA: Diagnosis not present

## 2015-11-02 DIAGNOSIS — I8390 Asymptomatic varicose veins of unspecified lower extremity: Secondary | ICD-10-CM | POA: Insufficient documentation

## 2015-11-02 DIAGNOSIS — Z79899 Other long term (current) drug therapy: Secondary | ICD-10-CM | POA: Diagnosis not present

## 2015-11-02 MED ORDER — APIXABAN 5 MG PO TABS: 5.0000 mg | ORAL_TABLET | Freq: Two times a day (BID) | ORAL | 6 refills | Status: DC

## 2015-11-02 NOTE — Patient Instructions (Signed)
Medication Instructions:   STOP ASPIRIN  START ELIQUIS 5 MG ONE TABLET TWICE DAILY  Follow-Up:  Your physician wants you to follow-up in: [place] will receive a reminder letter in the mail two months in advance. If you don't receive a letter, please call our office to schedule the follow-up appointment.

## 2015-11-02 NOTE — Progress Notes (Signed)
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 She is from [place] and was living in the [place] area. Her past medical history is significant for atrial fibrillation. This was first noted in 2012 and she was controlled initially with diltiazem. She was in a hospital in atrial fibrillation and spontaneously converted back to sinus. Subsequently she had some breakthroughs in her dose of diltiazem was increased. Eventually she saw different cardiologist and was placed on flecainide 50 mg twice a day has had only one breakthrough that she is aware of since starting on this medication. This was associated with a UTI which she was given Cipro, which I cautioned her about using in the future due to QT interactions. She also has a history of uterine cancer fortunately and is a survivor. She continues to have problems with bilateral hip osteoarthritis and is scheduled to have left hip replacement in the near future. She denies any chest pain or shortness of breath with exertion. She reports her prior cardiologist in [place] did stress testing within the last 2 years and has done several stress tests and she started with atrial fibrillation. There is no family history of coronary disease although cancer is somewhat rapid in the family.  Mr. Posa is doing fairly well today. She denies any chest pain or shortness of breath. She reports no recurrence of A. fib that she is aware of. She is maintaining normal sinus rhythm and is on flecainide 50 mg twice a day. QTc interval is 432 ms. She is also taking aspirin 650 mg daily.  I had the pleasure seen [name] back in the office today. She is doing exceedingly well. She had recent hip replacement and is ambulating much better. She reports her pain is improved and she is now off of narcotics. She's had no further problems  with palpitations after increasing her flecainide to 75 mg twice a day. Her EKG remained stable and heart rate is 80 today with a QTC of 454 ms. She tells me that her granddaughter recently had twins that were premature but are doing well and she is enjoying being grandmother.  11/02/2015  [name] returns today for follow-up. Overall she's been doing well. She denies any complaints such as palpitations or fluttering. She reports fairly good swelling on spironolactone which is her current diuretic. Although we typically would use a loop diuretic or thiazide, this is reasonable treatment and she's not had a problem with her kidney function or potassium. QTC today is 460 ms on flecainide with normal sinus rhythm. She is on full dose aspirin as her previous CHADSVASC score was 1, however since she is female and greater than age 51 her CHADSVASC score is now 2. We had a long discussion about her stroke risk, particularly the increased stroke risk for females in this age group. Her stroke risk is about 2.2%. If she were to take aspirin the stroke was actually slightly higher at 2.3% but maybe not statistically different. The bleeding risk is 1.1%. If she were to take Eliquis, her stroke risk is reduced to 0.8% with a bleeding risk of 2.6%.  PMHx:  Past Medical History:  Diagnosis Date  . Arthritis   . Atrial fibrillation ([place])   . Constipation   . Difficult intubation    pt states that her neck needs to  be in a neutral position,  scoliosis  . Family history of adverse reaction to anesthesia    nausea  . GERD (gastroesophageal reflux disease)    protonix, hx h. pylori  . History of uterine cancer 04/22/2014   sees Dr. Ronita Hipps; s/p complete hysterectomy  . Hx of colonic polyp   . 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 Melanoma [place])    sees Dr. Renda Rolls in dermatology  . 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 Peripheral vascular disease ([place])   . PONV (postoperative nausea and vomiting)   . Recurrent UTI   . Recurrent UTI   . S/P hip replacement   . Scoliosis   . Thyroid nodule   . Urinary incontinence   . Venous insufficiency    s/p ablation    Past Surgical History:  Procedure Laterality Date  . ABDOMINAL HYSTERECTOMY  09/10/2013  . BUNIONECTOMY  10/1976  . COLONOSCOPY    . FRACTURE SURGERY  09/1964  . MELANOMA EXCISION  12/2011  . TOTAL HIP ARTHROPLASTY Left 06/17/2014   Procedure: LEFT TOTAL HIP ARTHROPLASTY ANTERIOR APPROACH;  Surgeon: Mcarthur Rossetti, MD;  Location: [place];  Service: Orthopedics;  Laterality: Left;  . TOTAL HIP ARTHROPLASTY Right 09/02/2014   Procedure: RIGHT TOTAL HIP ARTHROPLASTY ANTERIOR APPROACH;  Surgeon: Mcarthur Rossetti, MD;  Location: [place];  Service: Orthopedics;  Laterality: Right;  . VEIN SURGERY  05/2011   venous ablation     FAMHx:  Family History  Problem Relation Age of Onset  . Uterine cancer Mother   . Diabetes Brother 49  . Hypertension Father 59  . Hyperlipidemia Father   . Cancer Father 26  . Epilepsy Father 24  . Arthritis Sister 53  . Cancer Maternal Grandmother   . Stroke Paternal Grandfather   . Arthritis Sister     SOCHx:   reports that she has never smoked. She has never used smokeless tobacco. She reports that she drinks alcohol. She reports that she does not use drugs.  ALLERGIES:  Allergies  Allergen Reactions  . Hydrolyzed Silk Hives and Other (See Comments)    Silk tape-blisters  . Gluten Meal Other (See Comments)    reflux    ROS: Pertinent items noted in HPI and remainder of comprehensive ROS otherwise negative.  HOME MEDS: Current Outpatient Prescriptions  Medication Sig Dispense Refill  . Calcium Carb-Cholecalciferol (CALCIUM-VITAMIN D) 600-400 MG-UNIT TABS Take 1 tablet by mouth 2 (two) times daily.    . flecainide (TAMBOCOR) 50 MG tablet Take 1.5 tablets (75 mg total) by mouth 2 (two) times  daily. 270 tablet 1  . mirabegron ER (MYRBETRIQ) 50 MG TB24 tablet Take 50 mg by mouth daily.    . pantoprazole (PROTONIX) 40 MG tablet Take 1 tablet (40 mg total) by mouth daily. 90 tablet 1  . senna (SENOKOT) 8.6 MG tablet Take 1 tablet by mouth as needed.     Marland Kitchen spironolactone (ALDACTONE) 50 MG tablet Take 50-100 mg by mouth 2 (two) times daily. 100 mg am and 50 mg pm    . TAZTIA XT 240 MG 24 hr capsule TAKE ONE CAPSULE BY MOUTH EVERY DAY 90 capsule 2  . triamcinolone cream (KENALOG) 0.1 % Apply 1 application topically as needed (for dry skin).   1  . apixaban (ELIQUIS) 5 MG TABS tablet Take 1 tablet (5 mg total) by mouth 2 (two) times daily. 60 tablet  6   No current facility-administered medications for this visit.     LABS/IMAGING: No results found for this or any previous visit (from the past 48 hour(s)). No results found.  VITALS: BP 124/68 (BP Location: Left Arm, Patient Position: Sitting, Cuff Size: Normal)   Pulse 75   Ht 5\' 11"  (1.803 m)   Wt 230 lb (104.3 kg)   BMI 32.08 kg/m   EXAM: Deferred  EKG: Normal sinus rhythm at 80, QTC 454 ms  ASSESSMENT: 1. History of paroxysmal atrial fibrillation - CHADSVASC of 2 (female, >65) 2. Long term antiarrhythmic therapy on flecainide 3. Bilateral hip osteoarthritis - status post bilateral TKR's 4. Bilateral venous insufficiency  PLAN: 1.   Mrs. Zammit has very infrequent A. fib if any at all on flecainide. She has had no further palpitations. Unfortunately her CHADSVASC score is now 2 and aspirin anticoagulation is considered insufficient protection. Despite her low burden of A. fib, I would encourage her to switch to a more potent anticoagulant. We discussed options today and will start Eliquis 5 mg by mouth twice a day. I advised her to stop her aspirin to lower her bleeding risk. She had previously taken Pradaxa for short period time without any significant side effects. Follow-up with me in 6 months.  Pixie Casino, MD,  [facility] Attending Cardiologist [place] 11/02/2015, 10:08 AM

## 2015-11-03 ENCOUNTER — Telehealth: Payer: Self-pay | Admitting: Internal Medicine

## 2015-11-03 NOTE — Telephone Encounter (Signed)
PA for eliquis 5mg  BID submitted via [email or web address] & approved   PA Case [id number]

## 2015-11-09 DIAGNOSIS — M6281 Muscle weakness (generalized): Secondary | ICD-10-CM | POA: Diagnosis not present

## 2015-11-09 DIAGNOSIS — R278 Other lack of coordination: Secondary | ICD-10-CM | POA: Diagnosis not present

## 2015-11-09 DIAGNOSIS — N3946 Mixed incontinence: Secondary | ICD-10-CM | POA: Diagnosis not present

## 2015-11-30 DIAGNOSIS — N3281 Overactive bladder: Secondary | ICD-10-CM | POA: Diagnosis not present

## 2015-11-30 DIAGNOSIS — N3020 Other chronic cystitis without hematuria: Secondary | ICD-10-CM | POA: Diagnosis not present

## 2015-12-08 DIAGNOSIS — R278 Other lack of coordination: Secondary | ICD-10-CM | POA: Diagnosis not present

## 2015-12-08 DIAGNOSIS — M6281 Muscle weakness (generalized): Secondary | ICD-10-CM | POA: Diagnosis not present

## 2015-12-08 DIAGNOSIS — N3281 Overactive bladder: Secondary | ICD-10-CM | POA: Diagnosis not present

## 2015-12-08 DIAGNOSIS — N3946 Mixed incontinence: Secondary | ICD-10-CM | POA: Diagnosis not present

## 2015-12-20 ENCOUNTER — Other Ambulatory Visit: Payer: Self-pay | Admitting: Family Medicine

## 2015-12-22 ENCOUNTER — Other Ambulatory Visit: Payer: Self-pay | Admitting: Internal Medicine

## 2016-01-01 ENCOUNTER — Other Ambulatory Visit: Payer: Self-pay | Admitting: Internal Medicine

## 2016-01-01 NOTE — Telephone Encounter (Signed)
Rx(s) sent to pharmacy electronically.  

## 2016-01-08 ENCOUNTER — Other Ambulatory Visit: Payer: Self-pay | Admitting: Surgery

## 2016-01-08 DIAGNOSIS — E042 Nontoxic multinodular goiter: Secondary | ICD-10-CM

## 2016-01-20 ENCOUNTER — Telehealth: Payer: Self-pay | Admitting: Internal Medicine

## 2016-01-20 NOTE — Telephone Encounter (Signed)
Leave message on pt own voicemail letting her know there is no Eliquis samples

## 2016-01-20 NOTE — Telephone Encounter (Signed)
New message      Patient calling the office for samples of medication:   1.  What medication and dosage are you requesting samples for? eliquis,5mg   2.  Are you currently out of this medication? almost

## 2016-01-20 NOTE — Telephone Encounter (Signed)
Call pt back letting her know eliqius samples just got in and some is at front desk for her to pick up

## 2016-02-15 ENCOUNTER — Other Ambulatory Visit: Payer: Medicare Other

## 2016-02-19 ENCOUNTER — Other Ambulatory Visit: Payer: Medicare Other

## 2016-02-24 ENCOUNTER — Ambulatory Visit
Admission: RE | Admit: 2016-02-24 | Discharge: 2016-02-24 | Disposition: A | Discharge status: Home / Self Care | Payer: Medicare Other | Source: Ambulatory Visit | Attending: Surgery | Admitting: Surgery

## 2016-02-24 DIAGNOSIS — E042 Nontoxic multinodular goiter: Secondary | ICD-10-CM

## 2016-02-28 NOTE — Progress Notes (Deleted)
HPI:  [name] is  A pleasant 65 yo with a PMH significant for A. Fib, Obesity, GERD, Hx Melanoma, Hx of Uterine Ca, hx recurrent UTIs. Active caring for twin toddlers. Reports. Denies. Due for pneumonia, flu and shingles vaccines.  A. Fibrillation: -managed by her cardiologist -not on anticoagulation -meds: eliquis, flecainide, diltiazem, spironolactone -take spironolactone for edema, started by vasc per her report  Obesity: -med diet and regular exercise advised  GERD: -on protonix 40 mg daily, reports always gets bad reflux if decreases -no hx bleeding, stricture, hiatal hernia -last colonoscopy April 2013, hx colon polyp so reports told to have another in 5 years  Hx Melanoma: -Seeing Dr. Renda Rolls in dermatology  Hx of Uterine Ca: -s/p complete hysterectomy and oophorectomy -sees Dr. Ronita Hipps for female and breast health  Dysuria/Recurrent UTIs: -seeing urologist; Seeing Dr. Louis Meckel -started myrbetric -doing better in terms of symptoms -had not tolerated medications in the past   ROS: See pertinent positives and negatives per HPI.  Past Medical History:  Diagnosis Date  . Arthritis   . Atrial fibrillation ([place])   . Constipation   . Difficult intubation    pt states that her neck needs to be in a neutral position,  scoliosis  . Family history of adverse reaction to anesthesia    nausea  . GERD (gastroesophageal reflux disease)    protonix, hx h. pylori  . History of uterine cancer 04/22/2014   sees Dr. Ronita Hipps; s/p complete hysterectomy  . Hx of colonic polyp   . 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 Melanoma [facility])    sees Dr. Renda Rolls in dermatology  . 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 Peripheral vascular disease ([place])   . PONV (postoperative nausea and vomiting)   . Recurrent UTI   . Recurrent UTI   . S/P hip replacement   . Scoliosis   .  Thyroid nodule   . Urinary incontinence   . Venous insufficiency    s/p ablation    Past Surgical History:  Procedure Laterality Date  . ABDOMINAL HYSTERECTOMY  09/10/2013  . BUNIONECTOMY  10/1976  . COLONOSCOPY    . FRACTURE SURGERY  09/1964  . MELANOMA EXCISION  12/2011  . TOTAL HIP ARTHROPLASTY Left 06/17/2014   Procedure: LEFT TOTAL HIP ARTHROPLASTY ANTERIOR APPROACH;  Surgeon: Mcarthur Rossetti, MD;  Location: [place];  Service: Orthopedics;  Laterality: Left;  . TOTAL HIP ARTHROPLASTY Right 09/02/2014   Procedure: RIGHT TOTAL HIP ARTHROPLASTY ANTERIOR APPROACH;  Surgeon: Mcarthur Rossetti, MD;  Location: [place];  Service: Orthopedics;  Laterality: Right;  . VEIN SURGERY  05/2011   venous ablation     Family History  Problem Relation Age of Onset  . Uterine cancer Mother   . Diabetes Brother 4  . Hypertension Father 79  . Hyperlipidemia Father   . Cancer Father 6  . Epilepsy Father 76  . Arthritis Sister 55  . Cancer Maternal Grandmother   . Stroke Paternal Grandfather   . Arthritis Sister     Social History   Social History  . Marital status: Divorced    Spouse name: N/A  . Number of children: 2  . Years of education: JD   Occupational History  . lawyer    Social History Main Topics  . Smoking status: Never Smoker  . Smokeless tobacco: Never Used  . Alcohol use 0.0 oz/week     Comment:  rarely  . Drug use: No  . Sexual activity: Not on file   Other Topics Concern  . Not on file   Social History Narrative   Work or School: retired Forensic psychologist      Home Situation: lives alone - takes care of twin grandchildren 7 months in 05/2015      Spiritual Beliefs:       Lifestyle: active, healthy diet           Current Outpatient Prescriptions:  .  apixaban (ELIQUIS) 5 MG TABS tablet, Take 1 tablet (5 mg total) by mouth 2 (two) times daily., Disp: 60 tablet, Rfl: 6 .  Calcium Carb-Cholecalciferol (CALCIUM-VITAMIN D) 600-400 MG-UNIT TABS, Take 1 tablet by  mouth 2 (two) times daily., Disp: , Rfl:  .  diltiazem (TIAZAC) 240 MG 24 hr capsule, TAKE 1 CAPSULE BY MOUTH EVERY DAY, Disp: 90 capsule, Rfl: 0 .  flecainide (TAMBOCOR) 50 MG tablet, TAKE 1 AND 1/2 TABLETS(75 MG) BY MOUTH TWICE DAILY, Disp: 270 tablet, Rfl: 0 .  mirabegron ER (MYRBETRIQ) 50 MG TB24 tablet, Take 50 mg by mouth daily., Disp: , Rfl:  .  pantoprazole (PROTONIX) 40 MG tablet, TAKE 1 TABLET(40 MG) BY MOUTH DAILY, Disp: 90 tablet, Rfl: 2 .  senna (SENOKOT) 8.6 MG tablet, Take 1 tablet by mouth as needed. , Disp: , Rfl:  .  spironolactone (ALDACTONE) 50 MG tablet, Take 50-100 mg by mouth 2 (two) times daily. 100 mg am and 50 mg pm, Disp: , Rfl:  .  triamcinolone cream (KENALOG) 0.1 %, Apply 1 application topically as needed (for dry skin). , Disp: , Rfl: 1  EXAM:  There were no vitals filed for this visit.  There is no height or weight on file to calculate BMI.  GENERAL: vitals reviewed and listed above, alert, oriented, appears well hydrated and in no acute distress  HEENT: atraumatic, conjunttiva clear, no obvious abnormalities on inspection of external nose and ears  NECK: no obvious masses on inspection  LUNGS: clear to auscultation bilaterally, no wheezes, rales or rhonchi, good air movement  CV: HRRR, no peripheral edema  MS: moves all extremities without noticeable abnormality  PSYCH: pleasant and cooperative, no obvious depression or anxiety  ASSESSMENT AND PLAN:  Discussed the following assessment and plan:  No diagnosis found.  -Patient advised to return or notify a doctor immediately if symptoms worsen or persist or new concerns arise.  There are no Patient Instructions on file for this visit.  Colin Benton R., DO

## 2016-02-29 ENCOUNTER — Telehealth: Payer: Self-pay | Admitting: *Deleted

## 2016-02-29 ENCOUNTER — Ambulatory Visit: Payer: Medicare Other | Admitting: Family Medicine

## 2016-02-29 DIAGNOSIS — Z0289 Encounter for other administrative examinations: Secondary | ICD-10-CM

## 2016-02-29 NOTE — Telephone Encounter (Signed)
Patient was a no-show for today's appt.  I called the pt and left a detailed message to check on her and asked that she call back to reschedule.

## 2016-03-12 DIAGNOSIS — Z23 Encounter for immunization: Secondary | ICD-10-CM | POA: Diagnosis not present

## 2016-03-17 ENCOUNTER — Ambulatory Visit
Admission: RE | Admit: 2016-03-17 | Discharge: 2016-03-17 | Disposition: A | Discharge status: Home / Self Care | Payer: Medicare Other | Source: Ambulatory Visit | Attending: Surgery | Admitting: Surgery

## 2016-03-17 DIAGNOSIS — E042 Nontoxic multinodular goiter: Secondary | ICD-10-CM | POA: Diagnosis not present

## 2016-03-19 ENCOUNTER — Other Ambulatory Visit: Payer: Self-pay | Admitting: Internal Medicine

## 2016-03-29 DIAGNOSIS — L821 Other seborrheic keratosis: Secondary | ICD-10-CM | POA: Diagnosis not present

## 2016-03-29 DIAGNOSIS — D225 Melanocytic nevi of trunk: Secondary | ICD-10-CM | POA: Diagnosis not present

## 2016-03-29 DIAGNOSIS — L814 Other melanin hyperpigmentation: Secondary | ICD-10-CM | POA: Diagnosis not present

## 2016-03-29 DIAGNOSIS — D1800 Hemangioma unspecified site: Secondary | ICD-10-CM | POA: Diagnosis not present

## 2016-03-29 DIAGNOSIS — Z85820 Personal history of malignant melanoma of skin: Secondary | ICD-10-CM | POA: Diagnosis not present

## 2016-03-30 ENCOUNTER — Other Ambulatory Visit: Payer: Self-pay | Admitting: Internal Medicine

## 2016-03-31 ENCOUNTER — Ambulatory Visit: Payer: Medicare Other | Admitting: Family Medicine

## 2016-04-05 DIAGNOSIS — E042 Nontoxic multinodular goiter: Secondary | ICD-10-CM | POA: Diagnosis not present

## 2016-04-06 DIAGNOSIS — C541 Malignant neoplasm of endometrium: Secondary | ICD-10-CM | POA: Diagnosis not present

## 2016-04-06 DIAGNOSIS — C55 Malignant neoplasm of uterus, part unspecified: Secondary | ICD-10-CM | POA: Diagnosis not present

## 2016-04-07 ENCOUNTER — Other Ambulatory Visit: Payer: Self-pay | Admitting: Internal Medicine

## 2016-04-07 ENCOUNTER — Other Ambulatory Visit: Payer: Self-pay | Admitting: Surgery

## 2016-04-07 ENCOUNTER — Telehealth: Payer: Self-pay | Admitting: Internal Medicine

## 2016-04-07 DIAGNOSIS — E042 Nontoxic multinodular goiter: Secondary | ICD-10-CM

## 2016-04-07 NOTE — Telephone Encounter (Signed)
New Message    Request for surgical clearance:  What type of surgery is being performed?   Biopsy  1. When is this surgery scheduled?  Not scheduled yet  2. Are there any medications that need to be held prior to surgery and how long? Eliquis  48 hours prior surgery  3. Name of physician performing surgery?  Imaging- not sure which radiologist will doing procedure    4. What is your office phone and fax number? [phone] fax [phone]

## 2016-04-07 NOTE — Telephone Encounter (Signed)
Spoke to staff member at [facility]. This is for thyroid biopsy. Informed her I would send clearance request/Eliquis hold request to provider for review.

## 2016-04-08 NOTE — Telephone Encounter (Signed)
Faxed to [facility]. 

## 2016-04-08 NOTE — Telephone Encounter (Signed)
Hold Eliquis 3 days prior to biopsy and restart >24 hours after.  Dr. Debara Pickett

## 2016-04-10 NOTE — Progress Notes (Signed)
HPI:  [name] is a pleasant 66 yo with a PMH significant for A. Fibrillation, GERD, Melanoma, Uterine Ca and recurrent UTIs here for follow up. She sees several specialist - see summary chronic conditions below. Her cardiologist started her on Eliquis in August. Does not appear to have had any labs since then. Reports doing well except for bad acid reflux. Has reflux and heartburn several times per week to daily despite qd-bid high dose ppi and ranitidine. She is due for colonoscopy for hx polyps. Seeing surgeon for thyroid nodules and will be having biopsy. Denies palpitations, increased LE edema, SOB, DOE, dysphagia, vomiting.  Due for colonosocpy in April (hx polyps), Prevnar 13, shingles vaccin, labs.  A. Fibrillation: -managed by her cardiologist, Dr. Debara Pickett -meds:now on Eliquis since 10/2015, flecainide, taztia -takes spironolactone for edema, started by vasc per her report  GERD: -on protonix 40 mg daily, reports always gets bad reflux if decreases -no hx bleeding, stricture, hiatal hernia -last colonoscopy April 2013, hx colon polyp so reports told to have another in 5 years  Hx Melanoma: -Seeing Dr. Renda Rolls in dermatology  Hx of Uterine Ca: -s/p complete hysterectomy and oophorectomy -sees Dr. Ronita Hipps for female and breast health  Dysuria/Recurrent UTIs: -seeing urologist, Dr. Louis Meckel -started myrbetric -doing better in terms of symptoms -had not tolerated medications in the past  ROS: See pertinent positives and negatives per HPI.  Past Medical History:  Diagnosis Date  . Arthritis   . Atrial fibrillation ([place])   . Constipation   . Difficult intubation    pt states that her neck needs to be in a neutral position,  scoliosis  . Family history of adverse reaction to anesthesia    nausea  . GERD (gastroesophageal reflux disease)    protonix, hx h. pylori  . History of uterine cancer 04/22/2014   sees Dr. Ronita Hipps; s/p complete hysterectomy  . Hx of colonic  polyp   . 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 Melanoma [facility])    sees Dr. Renda Rolls in dermatology  . 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 Peripheral vascular disease ([place])   . PONV (postoperative nausea and vomiting)   . Recurrent UTI   . Recurrent UTI   . S/P hip replacement   . Scoliosis   . Thyroid nodule   . Urinary incontinence   . Venous insufficiency    s/p ablation    Past Surgical History:  Procedure Laterality Date  . ABDOMINAL HYSTERECTOMY  09/10/2013  . BUNIONECTOMY  10/1976  . COLONOSCOPY    . FRACTURE SURGERY  09/1964  . MELANOMA EXCISION  12/2011  . TOTAL HIP ARTHROPLASTY Left 06/17/2014   Procedure: LEFT TOTAL HIP ARTHROPLASTY ANTERIOR APPROACH;  Surgeon: Mcarthur Rossetti, MD;  Location: [place];  Service: Orthopedics;  Laterality: Left;  . TOTAL HIP ARTHROPLASTY Right 09/02/2014   Procedure: RIGHT TOTAL HIP ARTHROPLASTY ANTERIOR APPROACH;  Surgeon: Mcarthur Rossetti, MD;  Location: [place];  Service: Orthopedics;  Laterality: Right;  . VEIN SURGERY  05/2011   venous ablation     Family History  Problem Relation Age of Onset  . Uterine cancer Mother   . Diabetes Brother 60  . Hypertension Father 53  . Hyperlipidemia Father   . Cancer Father 80  . Epilepsy Father 51  . Arthritis Sister 76  . Cancer Maternal Grandmother   . Stroke Paternal Grandfather   . Arthritis Sister  Social History   Social History  . Marital status: Divorced    Spouse name: N/A  . Number of children: 2  . Years of education: JD   Occupational History  . lawyer    Social History Main Topics  . Smoking status: Never Smoker  . Smokeless tobacco: Never Used  . Alcohol use 0.0 oz/week     Comment: rarely  . Drug use: No  . Sexual activity: Not Asked   Other Topics Concern  . None   Social History Narrative   Work or School: retired Tour manager Situation:  lives alone - takes care of twin grandchildren 7 months in 05/2015      Spiritual Beliefs:       Lifestyle: active, healthy diet           Current Outpatient Prescriptions:  .  apixaban (ELIQUIS) 5 MG TABS tablet, Take 1 tablet (5 mg total) by mouth 2 (two) times daily., Disp: 60 tablet, Rfl: 6 .  Calcium Carb-Cholecalciferol (CALCIUM-VITAMIN D) 600-400 MG-UNIT TABS, Take 1 tablet by mouth 2 (two) times daily., Disp: , Rfl:  .  diltiazem (TIAZAC) 240 MG 24 hr capsule, TAKE 1 CAPSULE BY MOUTH EVERY DAY, Disp: 90 capsule, Rfl: 1 .  flecainide (TAMBOCOR) 50 MG tablet, TAKE 1 AND 1/2 TABLETS(75 MG) BY MOUTH TWICE DAILY, Disp: 270 tablet, Rfl: 0 .  mirabegron ER (MYRBETRIQ) 50 MG TB24 tablet, Take 50 mg by mouth daily., Disp: , Rfl:  .  pantoprazole (PROTONIX) 40 MG tablet, TAKE 1 TABLET(40 MG) BY MOUTH DAILY, Disp: 90 tablet, Rfl: 2 .  senna (SENOKOT) 8.6 MG tablet, Take 1 tablet by mouth as needed. , Disp: , Rfl:  .  spironolactone (ALDACTONE) 50 MG tablet, Take 50-100 mg by mouth 2 (two) times daily. 100 mg am and 50 mg pm, Disp: , Rfl:  .  triamcinolone cream (KENALOG) 0.1 %, Apply 1 application topically as needed (for dry skin). , Disp: , Rfl: 1  EXAM:  Vitals:   04/11/16 0859  BP: 124/72  Pulse: 77  Temp: 97.8 F (36.6 C)    Body mass index is 33.31 kg/m.  GENERAL: vitals reviewed and listed above, alert, oriented, appears well hydrated and in no acute distress  HEENT: atraumatic, conjunttiva clear, no obvious abnormalities on inspection of external nose and ears  NECK: no obvious masses on inspection  LUNGS: clear to auscultation bilaterally, no wheezes, rales or rhonchi, good air movement  CV: HRRR, no peripheral edema  MS: moves all extremities without noticeable abnormality  PSYCH: pleasant and cooperative, no obvious depression or anxiety  ASSESSMENT AND PLAN:  Discussed the following assessment and plan:  Atrial fibrillation, unspecified type ([place]) - Plan:  CBC (no diff), Basic metabolic panel  Hx of colonic polyps - Plan: Ambulatory referral to Gastroenterology  Gastroesophageal reflux disease, esophagitis presence not specified - Plan: Ambulatory referral to Gastroenterology  Need for prophylactic vaccination against Streptococcus pneumoniae (pneumococcus) - Plan: Pneumococcal conjugate vaccine 13-valent IM  -labs today -prevnar 13 per her wishes -referral to GI for GERD/polyps - will likely need EGD/colonosocpy -lifestyle recs -seeing cardiologist for A. Fib, now on eliquis -seeing gen surg for thyroid nodules and per pt biopsy planned, she will discuss eliquis with cardiologist prior to procedures -Patient advised to return or notify a doctor immediately if symptoms worsen or persist or new concerns arise.  Patient Instructions  BEFORE YOU LEAVE: -labs -prevnar 13 -follow up:  1) Medicare Wellness visit  with Manuela Schwartz and follow up with Dr. Maudie Mercury in July  -We placed a referral for you as discussed to the gastroenterologist regarding the acid reflux and the histroy of polyps. It usually takes about 1-2 weeks to process and schedule this referral. If you have not heard from [place] regarding this appointment in 2 weeks please contact our office.  We have ordered labs or studies at this visit. It can take up to 1-2 weeks for results and processing. IF results require follow up or explanation, we will call you with instructions. Clinically stable results will be released to your [facility]. If you have not heard from [place] or cannot find your results in [facility] in 2 weeks please contact our office at [phone].  If you are not yet signed up for [facility], please consider signing up.   We recommend the following healthy lifestyle for LIFE: 1) Small portions.   Tip: eat off of a salad plate instead of a dinner plate.  Tip: if you need more or a snack choose fruits, veggies and/or a handful of nuts or seeds.  2) Eat a healthy clean diet.  * Tip: Avoid  (less then 1 serving per week): processed foods, sweets, sweetened drinks, white starches (rice, flour, bread, potatoes, pasta, etc), red meat, fast foods, butter  *Tip: CHOOSE instead   * 5-9 servings per day of fresh or frozen fruits and vegetables (but not corn, potatoes, bananas, canned or dried fruit)   *nuts and seeds, beans   *olives and olive oil   *small portions of lean meats such as fish and white chicken    *small portions of whole grains  3)Get at least 150 minutes of sweaty aerobic exercise per week.  4)Reduce stress - consider counseling, meditation and relaxation to balance other aspects of your life.             Colin Benton R., DO

## 2016-04-11 ENCOUNTER — Encounter: Payer: Self-pay | Admitting: Family Medicine

## 2016-04-11 ENCOUNTER — Ambulatory Visit (INDEPENDENT_AMBULATORY_CARE_PROVIDER_SITE_OTHER): Payer: PPO | Admitting: Family Medicine

## 2016-04-11 ENCOUNTER — Encounter: Payer: Self-pay | Admitting: Gastroenterology

## 2016-04-11 VITALS — BP 124/72 | HR 77 | Temp 97.8°F | Ht 71.0 in | Wt 238.8 lb

## 2016-04-11 DIAGNOSIS — I4891 Unspecified atrial fibrillation: Secondary | ICD-10-CM | POA: Diagnosis not present

## 2016-04-11 DIAGNOSIS — Z23 Encounter for immunization: Secondary | ICD-10-CM

## 2016-04-11 DIAGNOSIS — Z860100 Personal history of colon polyps, unspecified: Secondary | ICD-10-CM

## 2016-04-11 DIAGNOSIS — K219 Gastro-esophageal reflux disease without esophagitis: Secondary | ICD-10-CM

## 2016-04-11 DIAGNOSIS — R899 Unspecified abnormal finding in specimens from other organs, systems and tissues: Secondary | ICD-10-CM | POA: Diagnosis not present

## 2016-04-11 LAB — BASIC METABOLIC PANEL
BUN: 21 mg/dL (ref 6–23)
CHLORIDE: 102 meq/L (ref 96–112)
CO2: 26 mEq/L (ref 19–32)
Calcium: 9.5 mg/dL (ref 8.4–10.5)
Creatinine, Ser: 0.83 mg/dL (ref 0.40–1.20)
GFR: 72.87 mL/min (ref >60.00)
Glucose, Bld: 80 mg/dL (ref 70–99)
POTASSIUM: 5 meq/L (ref 3.5–5.1)
Sodium: 136 mEq/L (ref 135–145)

## 2016-04-11 LAB — CBC
HCT: 43.9 % (ref 36.0–46.0)
Hemoglobin: 15.1 g/dL — ABNORMAL HIGH (ref 12.0–15.0)
MCHC: 34.4 g/dL (ref 30.0–36.0)
MCV: 101.4 fl — AB (ref 78.0–100.0)
Platelets: 266 10*3/uL (ref 150.0–400.0)
RBC: 4.33 Mil/uL (ref 3.87–5.11)
RDW: 13 % (ref 11.5–15.5)
WBC: 6 10*3/uL (ref 4.0–10.5)

## 2016-04-11 NOTE — Patient Instructions (Addendum)
BEFORE YOU LEAVE: -labs -prevnar 13 -follow up:  1) Medicare Wellness visit with Manuela Schwartz and follow up with Dr. Maudie Mercury in July  -We placed a referral for you as discussed to the gastroenterologist regarding the acid reflux and the histroy of polyps. It usually takes about 1-2 weeks to process and schedule this referral. If you have not heard from [place] regarding this appointment in 2 weeks please contact our office.  We have ordered labs or studies at this visit. It can take up to 1-2 weeks for results and processing. IF results require follow up or explanation, we will call you with instructions. Clinically stable results will be released to your [facility]. If you have not heard from [place] or cannot find your results in [facility] in 2 weeks please contact our office at [phone].  If you are not yet signed up for [facility], please consider signing up.   We recommend the following healthy lifestyle for LIFE: 1) Small portions.   Tip: eat off of a salad plate instead of a dinner plate.  Tip: if you need more or a snack choose fruits, veggies and/or a handful of nuts or seeds.  2) Eat a healthy clean diet.  * Tip: Avoid (less then 1 serving per week): processed foods, sweets, sweetened drinks, white starches (rice, flour, bread, potatoes, pasta, etc), red meat, fast foods, butter  *Tip: CHOOSE instead   * 5-9 servings per day of fresh or frozen fruits and vegetables (but not corn, potatoes, bananas, canned or dried fruit)   *nuts and seeds, beans   *olives and olive oil   *small portions of lean meats such as fish and white chicken    *small portions of whole grains  3)Get at least 150 minutes of sweaty aerobic exercise per week.  4)Reduce stress - consider counseling, meditation and relaxation to balance other aspects of your life.

## 2016-04-11 NOTE — Progress Notes (Signed)
Pre visit review using our clinic review tool, if applicable. No additional management support is needed unless otherwise documented below in the visit note. 

## 2016-04-12 NOTE — Addendum Note (Signed)
Addended by: Agnes Lawrence on: 04/12/2016 07:55 AM   Modules accepted: Orders

## 2016-04-19 ENCOUNTER — Other Ambulatory Visit (HOSPITAL_COMMUNITY)
Admission: RE | Admit: 2016-04-19 | Discharge: 2016-04-19 | Disposition: A | Discharge status: Home / Self Care | Payer: PPO | Source: Ambulatory Visit | Attending: Interventional Radiology | Admitting: Interventional Radiology

## 2016-04-19 ENCOUNTER — Ambulatory Visit
Admission: RE | Admit: 2016-04-19 | Discharge: 2016-04-19 | Disposition: A | Discharge status: Home / Self Care | Payer: PPO | Source: Ambulatory Visit | Attending: Surgery | Admitting: Surgery

## 2016-04-19 DIAGNOSIS — E042 Nontoxic multinodular goiter: Secondary | ICD-10-CM

## 2016-04-19 DIAGNOSIS — E041 Nontoxic single thyroid nodule: Secondary | ICD-10-CM | POA: Diagnosis not present

## 2016-04-19 NOTE — Procedures (Signed)
Interventional Radiology Procedure Note  Procedure: US guide biopsy of 2 right sided thyroid nodules.  AFIRMA testing for each. Recommendations:  - Ok to shower tomorrow - Do not submerge for 7 days - Routine care   Signed,  Dulcy Fanny. Earleen Newport, DO

## 2016-05-02 ENCOUNTER — Ambulatory Visit: Payer: Medicare Other | Admitting: Internal Medicine

## 2016-05-03 ENCOUNTER — Encounter: Payer: Self-pay | Admitting: Internal Medicine

## 2016-05-03 ENCOUNTER — Ambulatory Visit (INDEPENDENT_AMBULATORY_CARE_PROVIDER_SITE_OTHER): Payer: PPO | Admitting: Internal Medicine

## 2016-05-03 VITALS — BP 118/66 | HR 75 | Ht 69.0 in | Wt 238.0 lb

## 2016-05-03 DIAGNOSIS — Z79899 Other long term (current) drug therapy: Secondary | ICD-10-CM

## 2016-05-03 DIAGNOSIS — I4891 Unspecified atrial fibrillation: Secondary | ICD-10-CM

## 2016-05-03 MED ORDER — SPIRONOLACTONE 50 MG PO TABS: 50.0000 mg | ORAL_TABLET | Freq: Two times a day (BID) | ORAL | 3 refills | Status: DC

## 2016-05-03 NOTE — Progress Notes (Signed)
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 She is from [place] and was living in the [place] area. Her past medical history is significant for atrial fibrillation. This was first noted in 2012 and she was controlled initially with diltiazem. She was in a hospital in atrial fibrillation and spontaneously converted back to sinus. Subsequently she had some breakthroughs in her dose of diltiazem was increased. Eventually she saw different cardiologist and was placed on flecainide 50 mg twice a day has had only one breakthrough that she is aware of since starting on this medication. This was associated with a UTI which she was given Cipro, which I cautioned her about using in the future due to QT interactions. She also has a history of uterine cancer fortunately and is a survivor. She continues to have problems with bilateral hip osteoarthritis and is scheduled to have left hip replacement in the near future. She denies any chest pain or shortness of breath with exertion. She reports her prior cardiologist in [place] did stress testing within the last 2 years and has done several stress tests and she started with atrial fibrillation. There is no family history of coronary disease although cancer is somewhat rapid in the family.  Mr. Gunnin is doing fairly well today. She denies any chest pain or shortness of breath. She reports no recurrence of A. fib that she is aware of. She is maintaining normal sinus rhythm and is on flecainide 50 mg twice a day. QTc interval is 432 ms. She is also taking aspirin 650 mg daily.  I had the pleasure seen [name] back in the office today. She is doing exceedingly well. She had recent hip replacement and is ambulating much better. She reports her pain is improved and she is now off of narcotics. She's had no further problems  with palpitations after increasing her flecainide to 75 mg twice a day. Her EKG remained stable and heart rate is 80 today with a QTC of 454 ms. She tells me that her granddaughter recently had twins that were premature but are doing well and she is enjoying being grandmother.  11/02/2015  [name] returns today for follow-up. Overall she's been doing well. She denies any complaints such as palpitations or fluttering. She reports fairly good swelling on spironolactone which is her current diuretic. Although we typically would use a loop diuretic or thiazide, this is reasonable treatment and she's not had a problem with her kidney function or potassium. QTC today is 460 ms on flecainide with normal sinus rhythm. She is on full dose aspirin as her previous CHADSVASC score was 1, however since she is female and greater than age 68 her CHADSVASC score is now 2. We had a long discussion about her stroke risk, particularly the increased stroke risk for females in this age group. Her stroke risk is about 2.2%. If she were to take aspirin the stroke was actually slightly higher at 2.3% but maybe not statistically different. The bleeding risk is 1.1%. If she were to take Eliquis, her stroke risk is reduced to 0.8% with a bleeding risk of 2.6%.  05/03/2016  [name] returns for day for follow-up. She is doing well without recurrent atrial fibrillation on flecainide. She is on L [name] is without any significant bleeding problems. Weight has been stable and blood  pressure is at goal.   PMHx:  Past Medical History:  Diagnosis Date  . Arthritis   . Atrial fibrillation ([place])   . Constipation   . Difficult intubation    pt states that her neck needs to be in a neutral position,  scoliosis  . Family history of adverse reaction to anesthesia    nausea  . GERD (gastroesophageal reflux disease)    protonix, hx h. pylori  . History of uterine cancer 04/22/2014   sees Dr. Ronita Hipps; s/p complete hysterectomy  . Hx  of colonic polyp   . 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 Melanoma [facility])    sees Dr. Renda Rolls in dermatology  . 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 Peripheral vascular disease ([place])   . PONV (postoperative nausea and vomiting)   . Recurrent UTI   . Recurrent UTI   . S/P hip replacement   . Scoliosis   . Thyroid nodule   . Urinary incontinence   . Venous insufficiency    s/p ablation    Past Surgical History:  Procedure Laterality Date  . ABDOMINAL HYSTERECTOMY  09/10/2013  . BUNIONECTOMY  10/1976  . COLONOSCOPY    . FRACTURE SURGERY  09/1964  . MELANOMA EXCISION  12/2011  . TOTAL HIP ARTHROPLASTY Left 06/17/2014   Procedure: LEFT TOTAL HIP ARTHROPLASTY ANTERIOR APPROACH;  Surgeon: Mcarthur Rossetti, MD;  Location: [place];  Service: Orthopedics;  Laterality: Left;  . TOTAL HIP ARTHROPLASTY Right 09/02/2014   Procedure: RIGHT TOTAL HIP ARTHROPLASTY ANTERIOR APPROACH;  Surgeon: Mcarthur Rossetti, MD;  Location: [place];  Service: Orthopedics;  Laterality: Right;  . VEIN SURGERY  05/2011   venous ablation     FAMHx:  Family History  Problem Relation Age of Onset  . Uterine cancer Mother   . Diabetes Brother 73  . Hypertension Father 40  . Hyperlipidemia Father   . Cancer Father 20  . Epilepsy Father 32  . Arthritis Sister 23  . Cancer Maternal Grandmother   . Stroke Paternal Grandfather   . Arthritis Sister     SOCHx:   reports that she has never smoked. She has never used smokeless tobacco. She reports that she drinks alcohol. She reports that she does not use drugs.  ALLERGIES:  Allergies  Allergen Reactions  . Hydrolyzed Silk Hives and Other (See Comments)    Silk tape-blisters  . Gluten Meal Other (See Comments)    reflux    ROS: Pertinent items noted in HPI and remainder of comprehensive ROS otherwise negative.  HOME MEDS: Current Outpatient Prescriptions   Medication Sig Dispense Refill  . apixaban (ELIQUIS) 5 MG TABS tablet Take 1 tablet (5 mg total) by mouth 2 (two) times daily. 60 tablet 6  . Calcium Carb-Cholecalciferol (CALCIUM-VITAMIN D) 600-400 MG-UNIT TABS Take 1 tablet by mouth 2 (two) times daily.    Marland Kitchen diltiazem (TIAZAC) 240 MG 24 hr capsule TAKE 1 CAPSULE BY MOUTH EVERY DAY 90 capsule 1  . flecainide (TAMBOCOR) 50 MG tablet TAKE 1 AND 1/2 TABLETS(75 MG) BY MOUTH TWICE DAILY 270 tablet 0  . mirabegron ER (MYRBETRIQ) 50 MG TB24 tablet Take 50 mg by mouth daily.    . pantoprazole (PROTONIX) 40 MG tablet TAKE 1 TABLET(40 MG) BY MOUTH DAILY 90 tablet 2  . senna (SENOKOT) 8.6 MG tablet Take 1 tablet by mouth as needed.     Marland Kitchen spironolactone (ALDACTONE) 50 MG  tablet Take 1-2 tablets (50-100 mg total) by mouth 2 (two) times daily. 100 mg am and 50 mg pm 270 tablet 3  . triamcinolone cream (KENALOG) 0.1 % Apply 1 application topically as needed (for dry skin).   1   No current facility-administered medications for this visit.     LABS/IMAGING: No results found for this or any previous visit (from the past 48 hour(s)). No results found.  VITALS: BP 118/66   Pulse 75   Ht 5\' 9"  (1.753 m)   Wt 238 lb (108 kg)   BMI 35.15 kg/m   EXAM: General appearance: alert and no distress Lungs: clear to auscultation bilaterally Heart: regular rate and rhythm, S1, S2 normal, no murmur, click, rub or gallop Pulses: 2+ and symmetric Neurologic: Grossly normal  EKG: Sinus rhythm at 75  ASSESSMENT: 1. History of paroxysmal atrial fibrillation - CHADSVASC of 2 (female, >65) 2. Long term antiarrhythmic therapy on flecainide 3. Bilateral hip osteoarthritis - status post bilateral TKR's 4. Bilateral venous insufficiency  PLAN: 1.   [name] seems to be doing well without recurrent A. fib. She is on antiarrhythmic therapy with flecainide and QTC today was 439 ms. She's tolerating eloquence without any bleeding problems. Overall doing well. Plan  to see her back annually or sooner as necessary.  Pixie Casino, MD, [place] [facility] Attending Cardiologist [facility] 05/03/2016, 5:34 PM

## 2016-05-03 NOTE — Patient Instructions (Signed)
Your physician wants you to follow-up in: ONE YEAR with Dr. Hilty. You will receive a reminder letter in the mail two months in advance. If you don't receive a letter, please call our office to schedule the follow-up appointment.  

## 2016-05-10 ENCOUNTER — Ambulatory Visit: Payer: PPO | Admitting: Gastroenterology

## 2016-05-10 ENCOUNTER — Telehealth: Payer: Self-pay | Admitting: Gastroenterology

## 2016-05-10 NOTE — Telephone Encounter (Signed)
Do not bill 

## 2016-05-17 ENCOUNTER — Other Ambulatory Visit (INDEPENDENT_AMBULATORY_CARE_PROVIDER_SITE_OTHER): Payer: PPO

## 2016-05-17 DIAGNOSIS — R899 Unspecified abnormal finding in specimens from other organs, systems and tissues: Secondary | ICD-10-CM | POA: Diagnosis not present

## 2016-05-17 LAB — CBC WITH DIFFERENTIAL/PLATELET
BASOS PCT: 1 % (ref 0.0–3.0)
Basophils Absolute: 0.1 10*3/uL (ref 0.0–0.1)
EOS ABS: 0.1 10*3/uL (ref 0.0–0.7)
EOS PCT: 1.4 % (ref 0.0–5.0)
HEMATOCRIT: 41.9 % (ref 36.0–46.0)
Hemoglobin: 14.5 g/dL (ref 12.0–15.0)
LYMPHS PCT: 35.6 % (ref 12.0–46.0)
Lymphs Abs: 1.9 10*3/uL (ref 0.7–4.0)
MCHC: 34.7 g/dL (ref 30.0–36.0)
MCV: 99.7 fl (ref 78.0–100.0)
MONO ABS: 0.6 10*3/uL (ref 0.1–1.0)
Monocytes Relative: 12.1 % — ABNORMAL HIGH (ref 3.0–12.0)
NEUTROS ABS: 2.6 10*3/uL (ref 1.4–7.7)
Neutrophils Relative %: 49.9 % (ref 43.0–77.0)
PLATELETS: 234 10*3/uL (ref 150.0–400.0)
RBC: 4.2 Mil/uL (ref 3.87–5.11)
RDW: 12.4 % (ref 11.5–15.5)
WBC: 5.3 10*3/uL (ref 4.0–10.5)

## 2016-06-02 DIAGNOSIS — N3020 Other chronic cystitis without hematuria: Secondary | ICD-10-CM | POA: Diagnosis not present

## 2016-06-17 ENCOUNTER — Other Ambulatory Visit: Payer: Self-pay | Admitting: Internal Medicine

## 2016-06-17 NOTE — Telephone Encounter (Signed)
REFILL 

## 2016-06-21 NOTE — Progress Notes (Signed)
06/22/2016 [name]   [birth year]  [id number]  Primary Physician Lucretia Kern., DO Primary Cardiologist: Dr. Debara Pickett   Reason for Visit/CC: F/u for PAF; recurrence of palpitations  HPI:  [name] is a 66 y.o. female who is being seen today for evaluation given recent palpitations, for which the patient is concerned is breakthrough atrial fibrillation.    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 She has a h/o PAF, which was first diagnosed while living in [place]. Her cardiologist in [place] started her on AAD therapy with Flecainide and she has done well with this for many years. She also reports that she underwent stress testing, when first diagnosed with afib, and she was told she had a normal study. After moving to [place], she established care with Dr. Debara Pickett. After she turned 64, her CHA2DS2 VASc score increased to 2 and stroke risk increased to 2.2%/year, thus it was decided to start her on Eliquis. She has no other cardiac history and no other cardiac risk factors. She also has a h/o thyroid nodules and has undergone biopsy. Apparently benign and being followed by an endocrinologist. She has a remote h/o uterine cancer and required a hysterectomy.   She was recently seen by Dr. Debara Pickett 05/03/16 for routine yearly f/u and was doing well w/o cardiac issues. She had recently had hip replacement surgery and had recovered well. Dr. Debara Pickett continued her on Flecainide 75 mg BID as well as Eliquis. She was advised to return in 1 year or sooner if needed.  She presents back to clinic ~6 weeks later, with complaint of intermittent palpitations which feel like her previous symptoms associated with her afib in the past. Has occurred several times within the past month. She notes that she was diagnosed with a UTI about 2 weeks ago but symptoms resolved after treatment with antibiotic. She has completed her antibiotics. No recent fever n/v/d. She has been staying well hydrated. She is  currently asymptomatic in clinic today. EKG today demonstrates NSR. HR 75 bpm. Qt/QTc 406/453 ms. BP is 116/70.     Current Meds  Medication Sig  . apixaban (ELIQUIS) 5 MG TABS tablet Take 1 tablet (5 mg total) by mouth 2 (two) times daily.  Marland Kitchen diltiazem (TIAZAC) 240 MG 24 hr capsule TAKE 1 CAPSULE BY MOUTH EVERY DAY  . mirabegron ER (MYRBETRIQ) 50 MG TB24 tablet Take 50 mg by mouth daily.  . pantoprazole (PROTONIX) 40 MG tablet TAKE 1 TABLET(40 MG) BY MOUTH DAILY  . senna (SENOKOT) 8.6 MG tablet Take 1 tablet by mouth as needed.   Marland Kitchen spironolactone (ALDACTONE) 50 MG tablet Take 1-2 tablets (50-100 mg total) by mouth 2 (two) times daily. 100 mg am and 50 mg pm  . triamcinolone cream (KENALOG) 0.1 % Apply 1 application topically as needed (for dry skin).   . [DISCONTINUED] flecainide (TAMBOCOR) 50 MG tablet TAKE 1 AND 1/2 TABLETS(75 MG) BY MOUTH TWICE DAILY   Allergies  Allergen Reactions  . Hydrolyzed Silk Hives and Other (See Comments)    Silk tape-blisters  . Ciprofloxacin     Reactions with antiarrythmic  . Gluten Meal Other (See Comments)    reflux   Past Medical History:  Diagnosis Date  . Arthritis   . Atrial fibrillation ([place])   . Constipation   . Difficult intubation    pt states that her neck needs to be in a neutral position,  scoliosis  . Family history of  adverse reaction to anesthesia    nausea  . GERD (gastroesophageal reflux disease)    protonix, hx h. pylori  . History of uterine cancer 04/22/2014   sees Dr. Ronita Hipps; s/p complete hysterectomy  . Hx of colonic polyp   . 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 Melanoma [name])    sees Dr. Renda Rolls in dermatology  . 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 Peripheral vascular disease ([place])   . PONV (postoperative nausea and vomiting)   . Recurrent UTI   . Recurrent UTI   . S/P hip replacement   . Scoliosis   . Thyroid  nodule   . Urinary incontinence   . Venous insufficiency    s/p ablation   Family History  Problem Relation Age of Onset  . Uterine cancer Mother   . Diabetes Brother 53  . Hypertension Father 81  . Hyperlipidemia Father   . Cancer Father 75  . Epilepsy Father 59  . Arthritis Sister 27  . Cancer Maternal Grandmother   . Stroke Paternal Grandfather   . Arthritis Sister    Past Surgical History:  Procedure Laterality Date  . ABDOMINAL HYSTERECTOMY  09/10/2013  . BUNIONECTOMY  10/1976  . COLONOSCOPY    . FRACTURE SURGERY  09/1964  . MELANOMA EXCISION  12/2011  . TOTAL HIP ARTHROPLASTY Left 06/17/2014   Procedure: LEFT TOTAL HIP ARTHROPLASTY ANTERIOR APPROACH;  Surgeon: Mcarthur Rossetti, MD;  Location: [place];  Service: Orthopedics;  Laterality: Left;  . TOTAL HIP ARTHROPLASTY Right 09/02/2014   Procedure: RIGHT TOTAL HIP ARTHROPLASTY ANTERIOR APPROACH;  Surgeon: Mcarthur Rossetti, MD;  Location: [place];  Service: Orthopedics;  Laterality: Right;  . VEIN SURGERY  05/2011   venous ablation    Social History   Social History  . Marital status: Divorced    Spouse name: N/A  . Number of children: 2  . Years of education: JD   Occupational History  . lawyer    Social History Main Topics  . Smoking status: Never Smoker  . Smokeless tobacco: Never Used  . Alcohol use 0.0 oz/week     Comment: rarely  . Drug use: No  . Sexual activity: Not on file   Other Topics Concern  . Not on file   Social History Narrative   Work or School: retired Forensic psychologist      Home Situation: lives alone - takes care of twin grandchildren 7 months in 05/2015      Spiritual Beliefs:       Lifestyle: active, healthy diet           Review of Systems: General: negative for chills, fever, night sweats or weight changes.  Cardiovascular: negative for chest pain, dyspnea on exertion, edema, orthopnea, palpitations, paroxysmal nocturnal dyspnea or shortness of breath Dermatological: negative  for rash Respiratory: negative for cough or wheezing Urologic: negative for hematuria Abdominal: negative for nausea, vomiting, diarrhea, bright red blood per rectum, melena, or hematemesis Neurologic: negative for visual changes, syncope, or dizziness All other systems reviewed and are otherwise negative except as noted above.   Physical Exam:  Blood pressure 116/70, pulse 75, height 5' 9.5" (1.765 m), weight 237 lb (107.5 kg).  General appearance: alert, cooperative and no distress Neck: no carotid bruit and no JVD Lungs: clear to auscultation bilaterally Heart: regular rate and rhythm, S1, S2 normal, no murmur, click, rub or gallop Extremities: extremities normal, atraumatic, no  cyanosis or edema Pulses: 2+ and symmetric Skin: Skin color, texture, turgor normal. No rashes or lesions Neurologic: Grossly normal  EKG NSR. 75 bpm. QT/QTc 406/453 ms -- personally reviewed   ASSESSMENT AND PLAN:    1. PAF: currently NSR, confirmed by EKG. HR 75 bpm. QT/QTc 406/453 ms. She has had recent symptoms of breakthrough afib despite full compliance with Flecainide and Cardizem. ? If recent UTI triggered recurrence. This has been treated and she has completed course of antibiotics. We will assess for other reversible causes. We will check basic labs>> TSH, CBC, BMP and Mg. She denies CP or dyspnea, thus no indication for stress test nor 2D echo at this time. We will further increase her Flecainide to 100 mg BID. Return in 1 week for repeat EKG with RN to monitor QT interval. Continue long acting Cardizem daily. Will also prescribe short acting Diltaizem, 60 mg, PRN for breakthrough palpitations/elevated HR. She will call us if she continues to have breakthrough symptoms. At that point, we can consider further increase in Flecanide to 150 mg BID, vs conversion to another AAD (Multaq vs Tikosyn), vs referral for afib ablation. F/u with Dr. Debara Pickett if recurrent or worsening symptoms despite the above measures.     [name], MHS [facility] 06/22/2016 1:25 PM

## 2016-06-22 ENCOUNTER — Encounter (INDEPENDENT_AMBULATORY_CARE_PROVIDER_SITE_OTHER): Payer: Self-pay

## 2016-06-22 ENCOUNTER — Ambulatory Visit (INDEPENDENT_AMBULATORY_CARE_PROVIDER_SITE_OTHER): Payer: PPO | Admitting: Cardiology

## 2016-06-22 ENCOUNTER — Encounter: Payer: Self-pay | Admitting: Cardiology

## 2016-06-22 VITALS — BP 116/70 | HR 75 | Ht 69.5 in | Wt 237.0 lb

## 2016-06-22 DIAGNOSIS — R002 Palpitations: Secondary | ICD-10-CM | POA: Diagnosis not present

## 2016-06-22 LAB — CBC WITH DIFFERENTIAL/PLATELET
BASOS PCT: 1 %
Basophils Absolute: 61 cells/uL (ref 0–200)
Eosinophils Absolute: 61 cells/uL (ref 15–500)
Eosinophils Relative: 1 %
HEMATOCRIT: 42.7 % (ref 35.0–45.0)
HEMOGLOBIN: 14.2 g/dL (ref 11.7–15.5)
LYMPHS ABS: 1952 {cells}/uL (ref 850–3900)
LYMPHS PCT: 32 %
MCH: 33.6 pg — ABNORMAL HIGH (ref 27.0–33.0)
MCHC: 33.3 g/dL (ref 32.0–36.0)
MCV: 101.2 fL — ABNORMAL HIGH (ref 80.0–100.0)
MONO ABS: 854 {cells}/uL (ref 200–950)
MPV: 9.8 fL (ref 7.5–12.5)
Monocytes Relative: 14 %
NEUTROS ABS: 3172 {cells}/uL (ref 1500–7800)
Neutrophils Relative %: 52 %
Platelets: 234 10*3/uL (ref 140–400)
RBC: 4.22 MIL/uL (ref 3.80–5.10)
RDW: 13 % (ref 11.0–15.0)
WBC: 6.1 10*3/uL (ref 3.8–10.8)

## 2016-06-22 LAB — TSH: TSH: 2.55 m[IU]/L

## 2016-06-22 LAB — BASIC METABOLIC PANEL
BUN: 24 mg/dL (ref 7–25)
CHLORIDE: 105 mmol/L (ref 98–110)
CO2: 25 mmol/L (ref 20–31)
Calcium: 9.2 mg/dL (ref 8.6–10.4)
Creat: 0.88 mg/dL (ref 0.50–0.99)
Glucose, Bld: 86 mg/dL (ref 65–99)
POTASSIUM: 4.7 mmol/L (ref 3.5–5.3)
SODIUM: 140 mmol/L (ref 135–146)

## 2016-06-22 MED ORDER — DILTIAZEM HCL 60 MG PO TABS: ORAL_TABLET | ORAL | 6 refills | Status: DC

## 2016-06-22 MED ORDER — FLECAINIDE ACETATE 100 MG PO TABS: 100.0000 mg | ORAL_TABLET | Freq: Two times a day (BID) | ORAL | 6 refills | Status: DC

## 2016-06-22 NOTE — Patient Instructions (Addendum)
Lab work today ( tsh,bmet,magnesium,cbc,)  Increase Flecainide to 100 mg twice a day  May take Cardizem 60 mg daily if needed for palpitations   Schedule appointment with nurse visit in 1 week to check QT interval    Keep appointment with Dr.Hilty as planned   Call back if you continue to have symptoms

## 2016-06-23 LAB — MAGNESIUM: Magnesium: 1.9 mg/dL (ref 1.5–2.5)

## 2016-06-28 ENCOUNTER — Telehealth: Payer: Self-pay

## 2016-06-28 ENCOUNTER — Encounter: Payer: Self-pay | Admitting: Gastroenterology

## 2016-06-28 ENCOUNTER — Ambulatory Visit (INDEPENDENT_AMBULATORY_CARE_PROVIDER_SITE_OTHER): Payer: PPO | Admitting: Gastroenterology

## 2016-06-28 VITALS — BP 120/70 | HR 80 | Ht 69.5 in | Wt 236.0 lb

## 2016-06-28 DIAGNOSIS — K219 Gastro-esophageal reflux disease without esophagitis: Secondary | ICD-10-CM | POA: Diagnosis not present

## 2016-06-28 DIAGNOSIS — Z800 Family history of malignant neoplasm of digestive organs: Secondary | ICD-10-CM

## 2016-06-28 MED ORDER — NA SULFATE-K SULFATE-MG SULF 17.5-3.13-1.6 GM/177ML PO SOLN: 1.0000 | Freq: Once | ORAL | 0 refills | Status: AC

## 2016-06-28 NOTE — Patient Instructions (Addendum)
You will be set up for a colonoscopy for personal history of precancerous polyps and also FH of colon cancer (father).  You should change the way you are taking your antiacid medicine (protonix) so that you are taking it 20-30 minutes prior to a decent meal as that is the way the pill is designed to work most effectively.  Hold eliquis for 2 days prior to colonoscopy (Will comminicate with Dr. Debara Pickett about the safety).  Ranitidine 150mg  pill, consider taking at bedtime when you think you may need it.

## 2016-06-28 NOTE — Progress Notes (Signed)
HPI: This is a  very pleasant 66 year old woman who was referred to me by Lucretia Kern, DO  to evaluate  family history colon cancer, chronic GERD .    Chief complaint is family history colon cancer, chronic GERD  Old Data Reviewed:  Colonoscopy May 2007 pathology shows rectal hyperplastic polyp. Colonoscopy August 2012 done for "personal history of polyps" showed a 5 mm descending colon polyp." The polyp was removed with cold snare. Polyp resection was incomplete"  Pathology showed fragments of sessile serrated polyp. No mention was made of cytologic atypia. Colonoscopy April 2013 done for "personal history adenoma with villous component"; A 4 mm polyp was removed with the cold snare "polyp resection was incomplete and the resected tissue was partially received retrieved.  I do not have the pathology report from this colonoscopy  EGD March 2008 done for abdominal pain showed mild gastritis. Biopsies from the esophagus and the stomach were normal.  Has chronic constipation: for many years.  Currently takes senekot daily.  Has tried fiber for about a week  Periodic pyrosis, she takes protonix.  Has completely given up caffeine. Still needs protonix,   No dysphagia.  Overall her weight is up a bit.  On eliquis, for afib.    Father had colon cancer dx'd at age 34  Review of systems: Pertinent positive and negative review of systems were noted in the above HPI section. Complete review of systems was performed and was otherwise normal.   Past Medical History:  Diagnosis Date  . Arthritis   . Atrial fibrillation ([place])   . Constipation   . Difficult intubation    pt states that her neck needs to be in a neutral position,  scoliosis  . Family history of adverse reaction to anesthesia    nausea  . GERD (gastroesophageal reflux disease)    protonix, hx h. pylori  . History of uterine cancer 04/22/2014   sees Dr. Ronita Hipps; s/p complete hysterectomy  . Hx of colonic polyp   . Lumbar and  sacral osteoarthritis 12/10/2013  . Melanoma [facility])    sees Dr. Renda Rolls in dermatology  . 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 Peripheral vascular disease ([place])   . PONV (postoperative nausea and vomiting)   . Recurrent UTI   . Recurrent UTI   . S/P hip replacement   . Scoliosis   . Thyroid nodule   . Urinary incontinence   . Venous insufficiency    s/p ablation    Past Surgical History:  Procedure Laterality Date  . ABDOMINAL HYSTERECTOMY  09/10/2013  . BUNIONECTOMY  10/1976  . COLONOSCOPY    . FRACTURE SURGERY  09/1964  . MELANOMA EXCISION  12/2011  . TOTAL HIP ARTHROPLASTY Left 06/17/2014   Procedure: LEFT TOTAL HIP ARTHROPLASTY ANTERIOR APPROACH;  Surgeon: Mcarthur Rossetti, MD;  Location: [place];  Service: Orthopedics;  Laterality: Left;  . TOTAL HIP ARTHROPLASTY Right 09/02/2014   Procedure: RIGHT TOTAL HIP ARTHROPLASTY ANTERIOR APPROACH;  Surgeon: Mcarthur Rossetti, MD;  Location: [place];  Service: Orthopedics;  Laterality: Right;  . VEIN SURGERY  05/2011   venous ablation     Current Outpatient Prescriptions  Medication Sig Dispense Refill  . apixaban (ELIQUIS) 5 MG TABS tablet Take 1 tablet (5 mg total) by mouth 2 (two) times daily. 60 tablet 6  . diltiazem (CARDIZEM) 60 MG tablet Take 60 mg daily if needed for palpitations 30 tablet  6  . diltiazem (TIAZAC) 240 MG 24 hr capsule TAKE 1 CAPSULE BY MOUTH EVERY DAY 90 capsule 1  . flecainide (TAMBOCOR) 100 MG tablet Take 1 tablet (100 mg total) by mouth 2 (two) times daily. 60 tablet 6  . mirabegron ER (MYRBETRIQ) 50 MG TB24 tablet Take 50 mg by mouth daily.    . pantoprazole (PROTONIX) 40 MG tablet TAKE 1 TABLET(40 MG) BY MOUTH DAILY 90 tablet 2  . senna (SENOKOT) 8.6 MG tablet Take 1 tablet by mouth as needed.     Marland Kitchen spironolactone (ALDACTONE) 50 MG tablet Take 1-2 tablets (50-100 mg total) by mouth 2 (two) times daily. 100 mg am  and 50 mg pm 270 tablet 3  . triamcinolone cream (KENALOG) 0.1 % Apply 1 application topically as needed (for dry skin).   1   No current facility-administered medications for this visit.     Allergies as of 06/28/2016 - Review Complete 06/28/2016  Allergen Reaction Noted  . Hydrolyzed silk Hives and Other (See Comments) 04/22/2014  . Ciprofloxacin  02/02/2015  . Gluten meal Other (See Comments) 06/05/2014    Family History  Problem Relation Age of Onset  . Uterine cancer Mother   . Diabetes Brother 73  . Hypertension Father 68  . Hyperlipidemia Father   . Cancer Father 15  . Epilepsy Father 53  . Arthritis Sister 100  . Cancer Maternal Grandmother   . Stroke Paternal Grandfather   . Arthritis Sister     Social History   Social History  . Marital status: Divorced    Spouse name: N/A  . Number of children: 2  . Years of education: JD   Occupational History  . lawyer    Social History Main Topics  . Smoking status: Never Smoker  . Smokeless tobacco: Never Used  . Alcohol use 0.0 oz/week     Comment: rarely  . Drug use: No  . Sexual activity: Not on file   Other Topics Concern  . Not on file   Social History Narrative   Work or School: retired Forensic psychologist      Home Situation: lives alone - takes care of twin grandchildren 7 months in 05/2015      Spiritual Beliefs:       Lifestyle: active, healthy diet           Physical Exam: BP 120/70   Pulse 80   Ht 5' 9.5" (1.765 m)   Wt 236 lb (107 kg)   BMI 34.35 kg/m  Constitutional: generally well-appearing Psychiatric: alert and oriented x3 Eyes: extraocular movements intact Mouth: oral pharynx moist, no lesions Neck: supple no lymphadenopathy Cardiovascular: heart regular rate and rhythm Lungs: clear to auscultation bilaterally Abdomen: soft, nontender, nondistended, no obvious ascites, no peritoneal signs, normal bowel sounds Extremities: no lower extremity edema bilaterally Skin: no lesions on visible  extremities   Assessment and plan: 66 y.o. female with  Family history of colon cancer, chronic GERD  Her father was diagnosed colon cancer around age of 49. Her last colonoscopy was 5 years ago. I reviewed 3 previous colonoscopy interactions and the documentation is a bit scattered but I do think that she has had at least one precancerous polyp previously and since she also has a family history of first through relative with colon cancer recommended repeat colonoscopy at this time. She has chronic intermittent GERD. It is mildly she has no current alarm symptoms. She does understand her proton pump inhibitor in the morning and she  will consider H2 blocker at bedtime on an as-needed basis. I do not think she needs EGD at this point.    Please see the "Patient Instructions" section for addition details about the plan.   Owens Loffler, MD [facility] 06/28/2016, 1:42 PM  Cc: Lucretia Kern, DO

## 2016-06-28 NOTE — Telephone Encounter (Signed)
Dear Dr Debara Pickett,    We have scheduled the above patient for an endoscopic procedure with Dr Ardis Hughs. Our records show that she is on anticoagulation therapy.   Please advise as to how long the patient may come off her therapy of Eliquis prior to the procedure, which is scheduled for 08/16/16.  Please fax back/ or route to Katina Degree, RN at [phone].   Sincerely,    Jeoffrey Massed RN   Dr Ardis Hughs is recommending 2 days prior to the procedure.

## 2016-06-30 ENCOUNTER — Ambulatory Visit (INDEPENDENT_AMBULATORY_CARE_PROVIDER_SITE_OTHER): Payer: PPO | Admitting: *Deleted

## 2016-06-30 VITALS — HR 69

## 2016-06-30 DIAGNOSIS — Z79899 Other long term (current) drug therapy: Secondary | ICD-10-CM

## 2016-06-30 NOTE — Progress Notes (Signed)
Pt presented for 1 week recheck EKG post-med changes. (QT interval) EKG was reviewed by Dr Ellyn Hack ([facility]) who noted normal QT interval, signed EKG.  Pt aware of normal findings, continue current med therapy/no further med adjustments at this time, given instructions to call if needed.

## 2016-06-30 NOTE — Patient Instructions (Signed)
Your physician recommends that you keep follow-up appointment with Dr. Debara Pickett as scheduled.

## 2016-07-04 NOTE — Progress Notes (Signed)
[name] to hold Eliquis for 3 days prior to endoscopic procedure and restart 24 hours after. She is considered low risk for the procedure.   Thanks.  -Mali

## 2016-07-05 ENCOUNTER — Telehealth: Payer: Self-pay

## 2016-07-05 NOTE — Telephone Encounter (Signed)
-----   Message from Pixie Casino, MD sent at 07/04/2016  2:36 PM EDT -----   ----- Message ----- From: Jeoffrey Massed, RN Sent: 06/28/2016   2:03 PM To: Pixie Casino, MD

## 2016-07-05 NOTE — Telephone Encounter (Signed)
  Left message on machine to call back      [name] to hold Eliquis for 3 days prior to endoscopic procedure and restart 24 hours after. She is considered low risk for the procedure.   Thanks.  -Mali

## 2016-07-06 NOTE — Telephone Encounter (Signed)
Pt has been notified of the 3 day hold ok'd by Dr Debara Pickett for Eliquis.

## 2016-07-07 ENCOUNTER — Other Ambulatory Visit: Payer: Self-pay | Admitting: Internal Medicine

## 2016-07-07 DIAGNOSIS — I4891 Unspecified atrial fibrillation: Secondary | ICD-10-CM

## 2016-07-07 NOTE — Telephone Encounter (Signed)
Rx has been sent to the pharmacy electronically. ° °

## 2016-07-13 ENCOUNTER — Telehealth: Payer: Self-pay | Admitting: Cardiology

## 2016-07-13 NOTE — Telephone Encounter (Signed)
New Message     Pt states the symptoms have NOT resolved do you want her to increase medication to 150? Or do you want her to come in

## 2016-07-13 NOTE — Telephone Encounter (Signed)
Spoke with pt, she continues to have break through palpitations. In the last 3 weeks she can have up to 2 episodes daily. She has only been 2 days without break through in 3 weeks. She questions if she needs to increase flecainide to 150 mg bid. She reports a tight feeling in the chest with the episodes of break through. She reports brittainy returned her call today but she cut her off. Will forward to brittainy.

## 2016-07-13 NOTE — Telephone Encounter (Signed)
Left message for pt to call.

## 2016-07-14 NOTE — Telephone Encounter (Signed)
Follow up    Pt is calling asking for a call back. She said she has been waiting on a phone call for 24 hours. She is looking for advice.

## 2016-07-15 ENCOUNTER — Encounter: Payer: Self-pay | Admitting: Internal Medicine

## 2016-07-15 NOTE — Telephone Encounter (Signed)
Spoke with pt, apologized for no return call. Discussed with dr Debara Pickett. Can not increase flecainide to 150 mg due to PR interval already being long. He would like for her to be seen in the atrial fib clinic. Clinic is closed next week due to conference. Appointment made for 07-27-16 @ 3pm. Directions and parking code given to the patient. She will keep a log of events to take to appointment. She will call prior to appt with concerns.

## 2016-07-27 ENCOUNTER — Ambulatory Visit (HOSPITAL_COMMUNITY)
Admission: RE | Admit: 2016-07-27 | Discharge: 2016-07-27 | Disposition: A | Discharge status: Home / Self Care | Payer: PPO | Source: Ambulatory Visit | Attending: Nurse Practitioner | Admitting: Nurse Practitioner

## 2016-07-27 ENCOUNTER — Encounter (HOSPITAL_COMMUNITY): Payer: Self-pay | Admitting: Nurse Practitioner

## 2016-07-27 VITALS — BP 116/74 | HR 71 | Ht 69.5 in | Wt 236.6 lb

## 2016-07-27 DIAGNOSIS — I872 Venous insufficiency (chronic) (peripheral): Secondary | ICD-10-CM | POA: Diagnosis not present

## 2016-07-27 DIAGNOSIS — Z8542 Personal history of malignant neoplasm of other parts of uterus: Secondary | ICD-10-CM | POA: Diagnosis not present

## 2016-07-27 DIAGNOSIS — Z87440 Personal history of urinary (tract) infections: Secondary | ICD-10-CM | POA: Insufficient documentation

## 2016-07-27 DIAGNOSIS — R0789 Other chest pain: Secondary | ICD-10-CM

## 2016-07-27 DIAGNOSIS — I480 Paroxysmal atrial fibrillation: Secondary | ICD-10-CM | POA: Diagnosis not present

## 2016-07-27 DIAGNOSIS — I739 Peripheral vascular disease, unspecified: Secondary | ICD-10-CM | POA: Insufficient documentation

## 2016-07-27 DIAGNOSIS — Z85820 Personal history of malignant melanoma of skin: Secondary | ICD-10-CM | POA: Diagnosis not present

## 2016-07-27 DIAGNOSIS — I4891 Unspecified atrial fibrillation: Secondary | ICD-10-CM | POA: Diagnosis not present

## 2016-07-27 DIAGNOSIS — Z9889 Other specified postprocedural states: Secondary | ICD-10-CM | POA: Insufficient documentation

## 2016-07-27 DIAGNOSIS — Z90710 Acquired absence of both cervix and uterus: Secondary | ICD-10-CM | POA: Diagnosis not present

## 2016-07-27 DIAGNOSIS — M419 Scoliosis, unspecified: Secondary | ICD-10-CM | POA: Insufficient documentation

## 2016-07-28 NOTE — Progress Notes (Signed)
Primary Care Physician: Lucretia Kern, DO Referring Physician: Dr. Apolonio Schneiders [name] is a 66 y.o. female with a h/o afib that has been on flecainide for many years in the afib clinic for evaluation. This drug has served her well for a long time but recently has had a lot of break through episodes. Her dose was increased to 100 mg a day and has just now had less afib. She tells me of episodes she is having that include chest pressure that radiates to her jaws that can occur with or without afib, with and without exertion. Last stress test around 5 years ago in [place] as well as her echo. She is on apixaban for a chadsvasc score of at least 3.  Today, she denies symptoms of palpitations, chest pain, shortness of breath, orthopnea, PND, lower extremity edema, dizziness, presyncope, syncope, or neurologic sequela. The patient is tolerating medications without difficulties and is otherwise without complaint today.   Past Medical History:  Diagnosis Date  . Arthritis   . Atrial fibrillation ([place])   . Constipation   . Difficult intubation    pt states that her neck needs to be in a neutral position,  scoliosis  . Family history of adverse reaction to anesthesia    nausea  . GERD (gastroesophageal reflux disease)    protonix, hx h. pylori  . History of uterine cancer 04/22/2014   sees Dr. Ronita Hipps; s/p complete hysterectomy  . Hx of colonic polyp   . 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 Melanoma [facility])    sees Dr. Renda Rolls in dermatology  . 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 Peripheral vascular disease ([place])   . PONV (postoperative nausea and vomiting)   . Recurrent UTI   . Recurrent UTI   . S/P hip replacement   . Scoliosis   . Thyroid nodule   . Urinary incontinence   . Venous insufficiency    s/p ablation   Past Surgical History:  Procedure Laterality Date  . ABDOMINAL  HYSTERECTOMY  09/10/2013  . BUNIONECTOMY  10/1976  . COLONOSCOPY    . FRACTURE SURGERY  09/1964  . MELANOMA EXCISION  12/2011  . TOTAL HIP ARTHROPLASTY Left 06/17/2014   Procedure: LEFT TOTAL HIP ARTHROPLASTY ANTERIOR APPROACH;  Surgeon: Mcarthur Rossetti, MD;  Location: [facility];  Service: Orthopedics;  Laterality: Left;  . TOTAL HIP ARTHROPLASTY Right 09/02/2014   Procedure: RIGHT TOTAL HIP ARTHROPLASTY ANTERIOR APPROACH;  Surgeon: Mcarthur Rossetti, MD;  Location: [facility];  Service: Orthopedics;  Laterality: Right;  . VEIN SURGERY  05/2011   venous ablation     Current Outpatient Prescriptions  Medication Sig Dispense Refill  . apixaban (ELIQUIS) 5 MG TABS tablet Take 1 tablet (5 mg total) by mouth 2 (two) times daily. Need appointment for more refills 180 tablet 0  . diltiazem (CARDIZEM) 60 MG tablet Take 60 mg daily if needed for palpitations 30 tablet 6  . diltiazem (TIAZAC) 240 MG 24 hr capsule TAKE 1 CAPSULE BY MOUTH EVERY DAY 90 capsule 1  . flecainide (TAMBOCOR) 100 MG tablet Take 1 tablet (100 mg total) by mouth 2 (two) times daily. 60 tablet 6  . mirabegron ER (MYRBETRIQ) 50 MG TB24 tablet Take 50 mg by mouth daily.    . pantoprazole (PROTONIX) 40 MG tablet TAKE 1 TABLET(40 MG) BY MOUTH DAILY 90 tablet 2  . senna (SENOKOT) 8.6  MG tablet Take 1 tablet by mouth as needed.     Marland Kitchen spironolactone (ALDACTONE) 50 MG tablet Take 1-2 tablets (50-100 mg total) by mouth 2 (two) times daily. 100 mg am and 50 mg pm 270 tablet 3  . triamcinolone cream (KENALOG) 0.1 % Apply 1 application topically as needed (for dry skin).   1   No current facility-administered medications for this encounter.     Allergies  Allergen Reactions  . Hydrolyzed Silk Hives and Other (See Comments)    Silk tape-blisters  . Ciprofloxacin     Reactions with antiarrythmic  . Gluten Meal Other (See Comments)    reflux    Social History   Social History  . Marital status: Divorced    Spouse name: N/A  .  Number of children: 2  . Years of education: JD   Occupational History  . lawyer    Social History Main Topics  . Smoking status: Never Smoker  . Smokeless tobacco: Never Used  . Alcohol use 0.0 oz/week     Comment: rarely  . Drug use: No  . Sexual activity: Not on file   Other Topics Concern  . Not on file   Social History Narrative   Work or School: retired Forensic psychologist      Home Situation: lives alone - takes care of twin grandchildren 7 months in 05/2015      Spiritual Beliefs:       Lifestyle: active, healthy diet          Family History  Problem Relation Age of Onset  . Uterine cancer Mother   . Diabetes Brother 37  . Hypertension Father 42  . Hyperlipidemia Father   . Cancer Father 52  . Epilepsy Father 36  . Arthritis Sister 28  . Cancer Maternal Grandmother   . Stroke Paternal Grandfather   . Arthritis Sister     ROS- All systems are reviewed and negative except as per the HPI above  Physical Exam: Vitals:   07/27/16 1503  BP: 116/74  Pulse: 71  Weight: 236 lb 9.6 oz (107.3 kg)  Height: 5' 9.5" (1.765 m)   Wt Readings from Last 3 Encounters:  07/27/16 236 lb 9.6 oz (107.3 kg)  06/28/16 236 lb (107 kg)  06/22/16 237 lb (107.5 kg)    Labs: Lab Results  Component Value Date   NA 140 06/22/2016   K 4.7 06/22/2016   CL 105 06/22/2016   CO2 25 06/22/2016   GLUCOSE 86 06/22/2016   BUN 24 06/22/2016   CREATININE 0.88 06/22/2016   CALCIUM 9.2 06/22/2016   MG 1.9 06/22/2016   No results found for: INR Lab Results  Component Value Date   CHOL 137 09/07/2015   HDL 39.80 09/07/2015   LDLCALC 87 09/07/2015   TRIG 53.0 09/07/2015     GEN- The patient is well appearing, alert and oriented x 3 today.   Head- normocephalic, atraumatic Eyes-  Sclera clear, conjunctiva pink Ears- hearing intact Oropharynx- clear Neck- supple, no JVP Lymph- no cervical lymphadenopathy Lungs- Clear to ausculation bilaterally, normal work of breathing Heart-  Regular rate and rhythm, no murmurs, rubs or gallops, PMI not laterally displaced GI- soft, NT, ND, + BS Extremities- no clubbing, cyanosis, or edema MS- no significant deformity or atrophy Skin- no rash or lesion Psych- euthymic mood, full affect Neuro- strength and sensation are intact  EKG- NSR at 71 bpm, pr int 192 ms, qrs int 100 ms, qtc 419 ms Epic records  reviewed    Assessment and Plan: 1. Paroxysmal afib Has been having a lot of break through afib on flecainide She is interested in hearing more about ablation Will update echo to make sure she is a good candidate after heart structure reviewed Continue flecainide 100 mg bid, afib  quiet for now  Continue 60 mg cardizem as needed Continue apixaban  2. Chest/jaw pressure Pt feels that symptoms are progressive Can occur with/without exertion and with afib Will obtain lexi myoview  F/u after the above tests   Butch Penny C. Carroll, [facility] [address] [place], [place] [address] [phone]

## 2016-08-10 ENCOUNTER — Telehealth (HOSPITAL_COMMUNITY): Payer: Self-pay | Admitting: *Deleted

## 2016-08-10 NOTE — Telephone Encounter (Signed)
Patient given detailed instructions per Myocardial Perfusion Study Information Sheet for the test on 08/16/16. Patient notified to arrive 15 minutes early and that it is imperative to arrive on time for appointment to keep from having the test rescheduled.  If you need to cancel or reschedule your appointment, please call the office within 24 hours of your appointment. . Patient verbalized understanding. Kirstie Peri

## 2016-08-11 ENCOUNTER — Other Ambulatory Visit (HOSPITAL_COMMUNITY): Payer: PPO

## 2016-08-11 ENCOUNTER — Ambulatory Visit (HOSPITAL_COMMUNITY): Payer: PPO

## 2016-08-12 ENCOUNTER — Ambulatory Visit (HOSPITAL_COMMUNITY): Payer: PPO

## 2016-08-16 ENCOUNTER — Ambulatory Visit (HOSPITAL_COMMUNITY): Payer: PPO

## 2016-08-16 ENCOUNTER — Encounter: Payer: PPO | Admitting: Gastroenterology

## 2016-08-16 ENCOUNTER — Encounter: Payer: Self-pay | Admitting: Gastroenterology

## 2016-08-16 ENCOUNTER — Other Ambulatory Visit (HOSPITAL_COMMUNITY): Payer: PPO

## 2016-08-17 ENCOUNTER — Ambulatory Visit (HOSPITAL_COMMUNITY): Payer: PPO

## 2016-08-24 ENCOUNTER — Encounter: Payer: PPO | Admitting: Gastroenterology

## 2016-08-24 ENCOUNTER — Ambulatory Visit (HOSPITAL_COMMUNITY): Payer: PPO | Admitting: Nurse Practitioner

## 2016-08-25 ENCOUNTER — Ambulatory Visit (HOSPITAL_BASED_OUTPATIENT_CLINIC_OR_DEPARTMENT_OTHER): Payer: PPO

## 2016-08-25 ENCOUNTER — Ambulatory Visit (HOSPITAL_COMMUNITY): Payer: PPO | Attending: Internal Medicine

## 2016-08-25 DIAGNOSIS — R0789 Other chest pain: Secondary | ICD-10-CM | POA: Diagnosis not present

## 2016-08-25 DIAGNOSIS — I480 Paroxysmal atrial fibrillation: Secondary | ICD-10-CM | POA: Diagnosis not present

## 2016-08-25 DIAGNOSIS — R079 Chest pain, unspecified: Secondary | ICD-10-CM | POA: Insufficient documentation

## 2016-08-25 LAB — MYOCARDIAL PERFUSION IMAGING
CHL CUP RESTING HR STRESS: 59 {beats}/min
CSEPPHR: 79 {beats}/min
LV dias vol: 112 mL (ref 46–106)
LVSYSVOL: 38 mL
NUC STRESS TID: 1.03
RATE: 0.28
SDS: 6
SRS: 8
SSS: 12

## 2016-08-25 LAB — ECHOCARDIOGRAM COMPLETE
HEIGHTINCHES: 69.5 in
WEIGHTICAEL: 3616 [oz_av]

## 2016-08-25 MED ORDER — TECHNETIUM TC 99M TETROFOSMIN IV KIT
10.3000 | PACK | Freq: Once | INTRAVENOUS | Status: AC | PRN
  Administered 2016-08-25: 10.3 via INTRAVENOUS
  Filled 2016-08-25: qty 11

## 2016-08-25 MED ORDER — REGADENOSON 0.4 MG/5ML IV SOLN
0.4000 mg | Freq: Once | INTRAVENOUS | Status: AC
  Administered 2016-08-25: 0.4 mg via INTRAVENOUS

## 2016-08-25 MED ORDER — TECHNETIUM TC 99M TETROFOSMIN IV KIT
30.4000 | PACK | Freq: Once | INTRAVENOUS | Status: AC | PRN
  Administered 2016-08-25: 30.4 via INTRAVENOUS
  Filled 2016-08-25: qty 31

## 2016-08-30 ENCOUNTER — Ambulatory Visit (AMBULATORY_SURGERY_CENTER): Payer: PPO | Admitting: Gastroenterology

## 2016-08-30 ENCOUNTER — Encounter: Payer: Self-pay | Admitting: Gastroenterology

## 2016-08-30 VITALS — BP 135/69 | HR 62 | Temp 97.8°F | Resp 22 | Ht 69.5 in | Wt 220.0 lb

## 2016-08-30 DIAGNOSIS — Z1211 Encounter for screening for malignant neoplasm of colon: Secondary | ICD-10-CM

## 2016-08-30 DIAGNOSIS — D124 Benign neoplasm of descending colon: Secondary | ICD-10-CM

## 2016-08-30 DIAGNOSIS — Z800 Family history of malignant neoplasm of digestive organs: Secondary | ICD-10-CM

## 2016-08-30 DIAGNOSIS — D123 Benign neoplasm of transverse colon: Secondary | ICD-10-CM

## 2016-08-30 DIAGNOSIS — Z1212 Encounter for screening for malignant neoplasm of rectum: Secondary | ICD-10-CM | POA: Diagnosis not present

## 2016-08-30 DIAGNOSIS — D122 Benign neoplasm of ascending colon: Secondary | ICD-10-CM

## 2016-08-30 MED ORDER — SODIUM CHLORIDE 0.9 % IV SOLN: 500.0000 mL | INTRAVENOUS | Status: DC

## 2016-08-30 NOTE — Patient Instructions (Signed)
Discharge instructions given. Handout on polyps. Resume previous medications. Ok to resume eliquis tomorrow. YOU HAD AN ENDOSCOPIC PROCEDURE TODAY AT [facility]:   Refer to the procedure report that was given to you for any specific questions about what was found during the examination.  If the procedure report does not answer your questions, please call your gastroenterologist to clarify.  If you requested that your care partner not be given the details of your procedure findings, then the procedure report has been included in a sealed envelope for you to review at your convenience later.  YOU SHOULD EXPECT: Some feelings of bloating in the abdomen. Passage of more gas than usual.  Walking can help get rid of the air that was put into your GI tract during the procedure and reduce the bloating. If you had a lower endoscopy (such as a colonoscopy or flexible sigmoidoscopy) you may notice spotting of blood in your stool or on the toilet paper. If you underwent a bowel prep for your procedure, you may not have a normal bowel movement for a few days.  Please Note:  You might notice some irritation and congestion in your nose or some drainage.  This is from the oxygen used during your procedure.  There is no need for concern and it should clear up in a day or so.  SYMPTOMS TO REPORT IMMEDIATELY:   Following lower endoscopy (colonoscopy or flexible sigmoidoscopy):  Excessive amounts of blood in the stool  Significant tenderness or worsening of abdominal pains  Swelling of the abdomen that is new, acute  Fever of 100F or higher   For urgent or emergent issues, a gastroenterologist can be reached at any hour by calling [phone].   DIET:  We do recommend a small meal at first, but then you may proceed to your regular diet.  Drink plenty of fluids but you should avoid alcoholic beverages for 24 hours.  ACTIVITY:  You should plan to take it easy for the rest of today and you  should NOT DRIVE or use heavy machinery until tomorrow (because of the sedation medicines used during the test).    FOLLOW UP: Our staff will call the number listed on your records the next business day following your procedure to check on you and address any questions or concerns that you may have regarding the information given to you following your procedure. If we do not reach you, we will leave a message.  However, if you are feeling well and you are not experiencing any problems, there is no need to return our call.  We will assume that you have returned to your regular daily activities without incident.  If any biopsies were taken you will be contacted by phone or by letter within the next 1-3 weeks.  Please call us at [phone] if you have not heard about the biopsies in 3 weeks.    SIGNATURES/CONFIDENTIALITY: You and/or your care partner have signed paperwork which will be entered into your electronic medical record.  These signatures attest to the fact that that the information above on your After Visit Summary has been reviewed and is understood.  Full responsibility of the confidentiality of this discharge information lies with you and/or your care-partner.

## 2016-08-30 NOTE — Progress Notes (Signed)
Spontaneous respirations throughout. VSS. Resting comfortably. To PACU on room air. Report to  Celia RN. 

## 2016-08-30 NOTE — Op Note (Addendum)
[facility] Patient Name: [name] Procedure Date: 08/30/2016 1:23 PM MRN: [id number] Endoscopist: Milus Banister , MD Age: 66 Referring MD:  Date of Birth: [birth year] Gender: Female Account #: [id number] Procedure:                Colonoscopy Indications:              High risk colon cancer surveillance: Personal                            history of colonic polyps, Family history of colon                            cancer in a first-degree relative: Colonoscopy May                            2007 pathology shows rectal hyperplastic                            polyp.Colonoscopy August 2012 done for "personal                            history of polyps" showed a 5 mm descending colon                            polyp."?The polyp was removed with cold snare.                            Polyp resection was incomplete" ?Pathology showed                            fragments of sessile serrated polyp. No mention was                            made of cytologic atypia. Colonoscopy April 2013                            done for "personal history adenoma with villous                            component"; A 4 mm polyp was removed with the cold                            snare "polyp resection was incomplete and the                            resected tissue was partially received retrieved.                            ?I do not have the pathology report from that                            colonoscopy Medicines:                Monitored Anesthesia Care Procedure:  Pre-Anesthesia Assessment:                           - Prior to the procedure, a History and Physical                            was performed, and patient medications and                            allergies were reviewed. The patient's tolerance of                            previous anesthesia was also reviewed. The risks                            and benefits of the procedure and the sedation                   options and risks were discussed with the patient.                            All questions were answered, and informed consent                            was obtained. Prior Anticoagulants: The patient has                            taken anticoagulant medication eliquis, last dose                            was 2 days prior to procedure. ASA Grade                            Assessment: II - A patient with mild systemic                            disease. After reviewing the risks and benefits,                            the patient was deemed in satisfactory condition to                            undergo the procedure.                           After obtaining informed consent, the colonoscope                            was passed under direct vision. Throughout the                            procedure, the patient's blood pressure, pulse, and                            oxygen saturations were monitored  continuously. The                            Colonoscope was introduced through the anus and                            advanced to the the cecum, identified by                            appendiceal orifice and ileocecal valve. The                            colonoscopy was performed without difficulty. The                            patient tolerated the procedure well. The quality                            of the bowel preparation was good. The ileocecal                            valve, appendiceal orifice, and rectum were                            photographed. Scope In: 1:25:54 PM Scope Out: 1:41:38 PM Scope Withdrawal Time: 0 hours 12 minutes 29 seconds  Total Procedure Duration: 0 hours 15 minutes 44 seconds  Findings:                 Four sessile polyps were found in the descending                            colon, transverse colon and ascending colon. The                            polyps were 3 to 6 mm in size. These polyps were                             removed with a cold snare. Resection and retrieval                            were complete.                           The exam was otherwise without abnormality on                            direct and retroflexion views. Complications:            No immediate complications. Estimated blood loss:                            None. Estimated Blood Loss:     Estimated blood loss: none. Impression:               - Four 3 to 6 mm polyps in  the descending colon, in                            the transverse colon and in the ascending colon,                            removed with a cold snare. Resected and retrieved.                           - The examination was otherwise normal on direct                            and retroflexion views. Recommendation:           - Patient has a contact number available for                            emergencies. The signs and symptoms of potential                            delayed complications were discussed with the                            patient. Return to normal activities tomorrow.                            Written discharge instructions were provided to the                            patient.                           - Resume previous diet.                           - Continue present medications. OK to resume your                            eliquis tomorrow AM.                           You will receive a letter within 2-3 weeks with the                            pathology results and my final recommendations.                           If the polyp(s) is proven to be 'pre-cancerous' on                            pathology, you will need repeat colonoscopy in 3-5                            years. Milus Banister, MD 08/30/2016 1:47:37 PM This report has been signed electronically.

## 2016-08-30 NOTE — Progress Notes (Signed)
Called to room to assist during endoscopic procedure.  Patient ID and intended procedure confirmed with present staff. Received instructions for my participation in the procedure from the performing physician.  

## 2016-08-31 ENCOUNTER — Telehealth: Payer: Self-pay | Admitting: *Deleted

## 2016-08-31 NOTE — Telephone Encounter (Signed)
  Follow up Call-  Call back number 08/30/2016  Post procedure Call Back phone  # [phone]  Permission to leave phone message Yes     Patient questions:  Do you have a fever, pain , or abdominal swelling? No. Pain Score  0 *  Have you tolerated food without any problems? Yes.    Have you been able to return to your normal activities? Yes.    Do you have any questions about your discharge instructions: Diet   No. Medications  No. Follow up visit  No.  Do you have questions or concerns about your Care? No.  Actions: * If pain score is 4 or above: No action needed, pain <4.  Follow up Call-  Call back number 08/30/2016  Post procedure Call Back phone  # [phone]  Permission to leave phone message Yes     Patient questions:  Do you have a fever, pain , or abdominal swelling? No. Pain Score  0 *  Have you tolerated food without any problems? Yes.    Have you been able to return to your normal activities? Yes.    Do you have any questions about your discharge instructions: Diet   No. Medications  No. Follow up visit  No.  Do you have questions or concerns about your Care? No.  Actions: * If pain score is 4 or above: No action needed, pain <4.

## 2016-09-01 ENCOUNTER — Ambulatory Visit (HOSPITAL_COMMUNITY): Payer: PPO | Admitting: Nurse Practitioner

## 2016-09-05 ENCOUNTER — Other Ambulatory Visit: Payer: Self-pay | Admitting: Family Medicine

## 2016-09-07 DIAGNOSIS — N3020 Other chronic cystitis without hematuria: Secondary | ICD-10-CM | POA: Diagnosis not present

## 2016-09-07 DIAGNOSIS — R300 Dysuria: Secondary | ICD-10-CM | POA: Diagnosis not present

## 2016-09-11 ENCOUNTER — Encounter: Payer: Self-pay | Admitting: Gastroenterology

## 2016-09-12 ENCOUNTER — Other Ambulatory Visit: Payer: Self-pay

## 2016-09-12 MED ORDER — SPIRONOLACTONE 25 MG PO TABS: 25.0000 mg | ORAL_TABLET | Freq: Every day | ORAL | 0 refills | Status: DC

## 2016-09-12 NOTE — Progress Notes (Signed)
R 25

## 2016-09-27 ENCOUNTER — Encounter: Payer: Self-pay | Admitting: Family Medicine

## 2016-09-27 ENCOUNTER — Telehealth: Payer: Self-pay | Admitting: Internal Medicine

## 2016-09-27 ENCOUNTER — Ambulatory Visit (INDEPENDENT_AMBULATORY_CARE_PROVIDER_SITE_OTHER): Payer: PPO | Admitting: Family Medicine

## 2016-09-27 VITALS — BP 120/70 | HR 74 | Temp 98.7°F | Ht 69.5 in | Wt 222.8 lb

## 2016-09-27 DIAGNOSIS — M21612 Bunion of left foot: Secondary | ICD-10-CM

## 2016-09-27 DIAGNOSIS — I480 Paroxysmal atrial fibrillation: Secondary | ICD-10-CM | POA: Diagnosis not present

## 2016-09-27 DIAGNOSIS — M5030 Other cervical disc degeneration, unspecified cervical region: Secondary | ICD-10-CM | POA: Diagnosis not present

## 2016-09-27 DIAGNOSIS — M542 Cervicalgia: Secondary | ICD-10-CM

## 2016-09-27 DIAGNOSIS — Z8679 Personal history of other diseases of the circulatory system: Secondary | ICD-10-CM | POA: Diagnosis not present

## 2016-09-27 NOTE — Patient Instructions (Signed)
BEFORE YOU LEAVE: -follow up: AWV/Follow up in 3 months  -We placed a referral for the bunion and the neck issues. It usually takes about 1-2 weeks to process and schedule this referral. If you have not heard from [place] regarding this appointment in 2 weeks please contact our office.

## 2016-09-27 NOTE — Telephone Encounter (Signed)
Patient taking spironolactone differently: Takes 100mg  every morning and 50mg  in the afternoon. Pt has medication, just need directions changed on medication, ok to change sig to 100mg  in the am and 50mg  in the pm?hilty

## 2016-09-27 NOTE — Progress Notes (Signed)
HPI:  [name] is here for follow up and several new complaints:  L foot bunion: -reports hx remote bunion surgery on R foot -wants referral to podiatry - discomfort/pain  Neck pain: -for many years but worsening -reports saw [facility] remotely in [place] whom advised urgent surgery for DD, then saw Dr. Beverely Pace at [facility] and was told did not need surgery and to follow up if worsening symptoms -now with neck pain with computer and piano work and zingers in neck once per week -she wants to see specialist but does not want to travel to [place] -no fevers, malaise, radiation to extremities, weakness or numbness  A. Fib/HTN: -sees cardiologist for this -doing well recently -meds: eliquis, diltiazem 240 daily, flecainide 100bid, spironolactone 100 am/50 pm -denies any recent CP, SOB, palpitations, swelling  IBS-C/GERD: -reports doing better with changing diet  Recurrent UTIs: -sees urologist  ROS: See pertinent positives and negatives per HPI.  Past Medical History:  Diagnosis Date  . Arthritis   . Atrial fibrillation ([place])   . Constipation   . Difficult intubation    pt states that her neck needs to be in a neutral position,  scoliosis  . Family history of adverse reaction to anesthesia    nausea  . GERD (gastroesophageal reflux disease)    protonix, hx h. pylori  . History of uterine cancer 04/22/2014   sees Dr. Ronita Hipps; s/p complete hysterectomy  . Hx of colonic polyp   . 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 Melanoma [facility])    sees Dr. Renda Rolls in dermatology  . 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 Peripheral vascular disease ([place])   . PONV (postoperative nausea and vomiting)   . Recurrent UTI   . Recurrent UTI   . S/P hip replacement   . Scoliosis   . Thyroid nodule   . Urinary incontinence   . Venous insufficiency    s/p ablation    Past Surgical History:  Procedure  Laterality Date  . ABDOMINAL HYSTERECTOMY  09/10/2013  . BUNIONECTOMY  10/1976  . COLONOSCOPY    . FRACTURE SURGERY  09/1964  . MELANOMA EXCISION  12/2011  . TOTAL HIP ARTHROPLASTY Left 06/17/2014   Procedure: LEFT TOTAL HIP ARTHROPLASTY ANTERIOR APPROACH;  Surgeon: Mcarthur Rossetti, MD;  Location: [place];  Service: Orthopedics;  Laterality: Left;  . TOTAL HIP ARTHROPLASTY Right 09/02/2014   Procedure: RIGHT TOTAL HIP ARTHROPLASTY ANTERIOR APPROACH;  Surgeon: Mcarthur Rossetti, MD;  Location: [place];  Service: Orthopedics;  Laterality: Right;  . VEIN SURGERY  05/2011   venous ablation     Family History  Problem Relation Age of Onset  . Uterine cancer Mother   . Diabetes Brother 99  . Hypertension Father 66  . Hyperlipidemia Father   . Cancer Father 69  . Epilepsy Father 87  . Arthritis Sister 42  . Cancer Maternal Grandmother   . Stroke Paternal Grandfather   . Arthritis Sister     Social History   Social History  . Marital status: Divorced    Spouse name: N/A  . Number of children: 2  . Years of education: JD   Occupational History  . lawyer    Social History Main Topics  . Smoking status: Never Smoker  . Smokeless tobacco: Never Used  . Alcohol use 0.0 oz/week     Comment: rarely  . Drug use: No  . Sexual activity:  Not Asked   Other Topics Concern  . None   Social History Narrative   Work or School: retired Tour manager Situation: lives alone - takes care of twin grandchildren 7 months in 05/2015      Spiritual Beliefs:       Lifestyle: active, healthy diet           Current Outpatient Prescriptions:  .  apixaban (ELIQUIS) 5 MG TABS tablet, Take 1 tablet (5 mg total) by mouth 2 (two) times daily. Need appointment for more refills, Disp: 180 tablet, Rfl: 0 .  diltiazem (CARDIZEM) 60 MG tablet, Take 60 mg daily if needed for palpitations, Disp: 30 tablet, Rfl: 6 .  diltiazem (TIAZAC) 240 MG 24 hr capsule, TAKE 1 CAPSULE BY MOUTH EVERY DAY,  Disp: 90 capsule, Rfl: 1 .  flecainide (TAMBOCOR) 100 MG tablet, Take 1 tablet (100 mg total) by mouth 2 (two) times daily., Disp: 60 tablet, Rfl: 6 .  mirabegron ER (MYRBETRIQ) 50 MG TB24 tablet, Take 50 mg by mouth daily., Disp: , Rfl:  .  pantoprazole (PROTONIX) 40 MG tablet, TAKE 1 TABLET(40 MG) BY MOUTH DAILY, Disp: 90 tablet, Rfl: 1 .  spironolactone (ALDACTONE) 25 MG tablet, Take 1 tablet (25 mg total) by mouth daily. (Patient taking differently: Take 25 mg by mouth. Takes 100mg  every morning and 50mg  in the afternoon), Disp: 180 tablet, Rfl: 0 .  triamcinolone cream (KENALOG) 0.1 %, Apply 1 application topically as needed (for dry skin). , Disp: , Rfl: 1  Current Facility-Administered Medications:  .  0.9 %  sodium chloride infusion, 500 mL, Intravenous, Continuous, Milus Banister, MD  EXAM:  Vitals:   09/27/16 1551  BP: 120/70  Pulse: 74  Temp: 98.7 F (37.1 C)    Body mass index is 32.43 kg/m.  GENERAL: vitals reviewed and listed above, alert, oriented, appears well hydrated and in no acute distress  HEENT: atraumatic, conjunttiva clear, no obvious abnormalities on inspection of external nose and ears  NECK: no obvious masses on inspection  LUNGS: clear to auscultation bilaterally, no wheezes, rales or rhonchi, good air movement  CV: HRRR, no peripheral edema  MS: moves all extremities without noticeable abnormality, normal inspection head and neck with decent ROM neck/head, normal strength and sensitivity to light touch in bilat upper extremities; bunion L 1st MTP  PSYCH: pleasant and cooperative, no obvious depression or anxiety  ASSESSMENT AND PLAN:  Discussed the following assessment and plan:  Neck pain - Plan: Ambulatory referral to Orthopedic Surgery  DDD (degenerative disc disease), cervical - Plan: Ambulatory referral to Orthopedic Surgery  Bunion of great toe of left foot - Plan: Ambulatory referral to Podiatry  Paroxysmal atrial fibrillation  (HCC)  Hx of essential hypertension  -referral per request/orders -healthy diet and high fiber foods discussed -cont follow up/meds with cardiology for heat issues - BP good today  -labs and wellness visit next visit -Patient advised to return or notify a doctor immediately if symptoms worsen or persist or new concerns arise.  Patient Instructions  BEFORE YOU LEAVE: -follow up: AWV/Follow up in 3 months  -We placed a referral for the bunion and the neck issues. It usually takes about 1-2 weeks to process and schedule this referral. If you have not heard from [place] regarding this appointment in 2 weeks please contact our office.     Colin Benton R., DO

## 2016-09-27 NOTE — Telephone Encounter (Signed)
New message    Pt is calling about her spironolactone. She said she was taking a big dose and now she was given 25 mg once daily. Please call.

## 2016-09-28 ENCOUNTER — Other Ambulatory Visit: Payer: Self-pay | Admitting: Internal Medicine

## 2016-09-29 MED ORDER — SPIRONOLACTONE 50 MG PO TABS: 50.0000 mg | ORAL_TABLET | Freq: Every day | ORAL | 5 refills | Status: DC

## 2016-09-29 NOTE — Telephone Encounter (Signed)
Yes .. looks like she was on 100 QAM and 50 mg QPM when I last saw her. Please change back to that.  Dr. Lemmie Evens

## 2016-09-29 NOTE — Telephone Encounter (Signed)
Rx sent as requested  Left detailed message (DPR)

## 2016-10-01 DIAGNOSIS — H524 Presbyopia: Secondary | ICD-10-CM | POA: Diagnosis not present

## 2016-10-03 DIAGNOSIS — Z1231 Encounter for screening mammogram for malignant neoplasm of breast: Secondary | ICD-10-CM | POA: Diagnosis not present

## 2016-10-03 DIAGNOSIS — Z01419 Encounter for gynecological examination (general) (routine) without abnormal findings: Secondary | ICD-10-CM | POA: Diagnosis not present

## 2016-10-04 DIAGNOSIS — L570 Actinic keratosis: Secondary | ICD-10-CM | POA: Diagnosis not present

## 2016-10-04 DIAGNOSIS — L814 Other melanin hyperpigmentation: Secondary | ICD-10-CM | POA: Diagnosis not present

## 2016-10-04 DIAGNOSIS — Z85820 Personal history of malignant melanoma of skin: Secondary | ICD-10-CM | POA: Diagnosis not present

## 2016-10-04 DIAGNOSIS — L821 Other seborrheic keratosis: Secondary | ICD-10-CM | POA: Diagnosis not present

## 2016-10-04 DIAGNOSIS — L723 Sebaceous cyst: Secondary | ICD-10-CM | POA: Diagnosis not present

## 2016-10-04 DIAGNOSIS — B078 Other viral warts: Secondary | ICD-10-CM | POA: Diagnosis not present

## 2016-10-04 DIAGNOSIS — D225 Melanocytic nevi of trunk: Secondary | ICD-10-CM | POA: Diagnosis not present

## 2016-10-04 DIAGNOSIS — D1800 Hemangioma unspecified site: Secondary | ICD-10-CM | POA: Diagnosis not present

## 2016-10-07 ENCOUNTER — Ambulatory Visit: Payer: PPO | Admitting: Family Medicine

## 2016-10-14 ENCOUNTER — Encounter: Payer: Self-pay | Admitting: Podiatry

## 2016-10-14 ENCOUNTER — Ambulatory Visit (INDEPENDENT_AMBULATORY_CARE_PROVIDER_SITE_OTHER): Payer: PPO

## 2016-10-14 ENCOUNTER — Ambulatory Visit (INDEPENDENT_AMBULATORY_CARE_PROVIDER_SITE_OTHER): Payer: PPO | Admitting: Podiatry

## 2016-10-14 DIAGNOSIS — M2040 Other hammer toe(s) (acquired), unspecified foot: Secondary | ICD-10-CM | POA: Diagnosis not present

## 2016-10-14 DIAGNOSIS — M2010 Hallux valgus (acquired), unspecified foot: Secondary | ICD-10-CM | POA: Diagnosis not present

## 2016-10-14 NOTE — Patient Instructions (Signed)

## 2016-10-14 NOTE — Progress Notes (Signed)
Subjective:    Patient ID: [name], female   DOB: 66 y.o.   MRN: [id number]   HPI patient states she has a painful bunion on her left foot along with the second toe and she had had previous surgery on her right foot approximately 40 years ago with slight reoccurrence but much better than it was    Review of Systems  All other systems reviewed and are negative.       Objective:  Physical Exam  Cardiovascular: Intact distal pulses.   Pulmonary/Chest: Breath sounds normal.  Musculoskeletal: Normal range of motion.  Skin: Skin is warm.  Nursing note and vitals reviewed.  neurovascular status intact muscle strength adequate range of motion within normal limits with patient noted to have large hyperostosis medial aspect first metatarsal head left with redness around the joint and pain and deviation the hallux against second toe with dorsal elevation second digit left that's rigid in nature. Found have good digital perfusion well oriented 3 with history of A. 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 fib with anticoagulant therapy      Plan:   H&P condition reviewed and she will contact cardiology but I would suspect for surgery she would stop her L a course 3-5 days prior to procedure. I then discussed the bunion and I do think a distal osteotomy would give her the best relief with a minimal amount of complication I did explain this versus fusion or proximal osteotomy. Patient wants to go this route and also mild digital fusion and will have this done in the next several months reappoint for consult in approximately 1 multiple have it done at the end of the summer  X-ray indicates elevation of the intermetatarsal angle left with a reduction of the intermetatarsal angle right  secondary surgery

## 2016-10-24 DIAGNOSIS — M48062 Spinal stenosis, lumbar region with neurogenic claudication: Secondary | ICD-10-CM | POA: Diagnosis not present

## 2016-10-24 DIAGNOSIS — M4802 Spinal stenosis, cervical region: Secondary | ICD-10-CM | POA: Diagnosis not present

## 2016-10-27 ENCOUNTER — Telehealth: Payer: Self-pay | Admitting: *Deleted

## 2016-10-27 NOTE — Telephone Encounter (Addendum)
"  Dr. Paulla Dolly asked that I call you about scheduling my surgery.  I have a follow up appointment with him on August 31."    "I'd like to schedule my surgery with Dr. Paulla Dolly."  Have you signed consent forms?  "No, I have not.  I'm scheduled to see him on August 31.  I would like to schedule the surgery after the 15th of September on either a Thursday or Friday  "Dr. Paulla Dolly only does surgeries on Tuesdays.  I can put you down tentatively for a date.  He can do it on September 18.  "That date will be fine, pencil me in tentatively."

## 2016-11-11 ENCOUNTER — Encounter: Payer: Self-pay | Admitting: Podiatry

## 2016-11-11 ENCOUNTER — Ambulatory Visit (INDEPENDENT_AMBULATORY_CARE_PROVIDER_SITE_OTHER): Payer: PPO | Admitting: Podiatry

## 2016-11-11 DIAGNOSIS — M2010 Hallux valgus (acquired), unspecified foot: Secondary | ICD-10-CM | POA: Diagnosis not present

## 2016-11-11 DIAGNOSIS — M2040 Other hammer toe(s) (acquired), unspecified foot: Secondary | ICD-10-CM | POA: Diagnosis not present

## 2016-11-11 NOTE — Progress Notes (Signed)
Subjective:    Patient ID: [name], female   DOB: 66 y.o.   MRN: [id number]   HPI patient presents to schedule surgery for chronic bunion deformity hammertoe deformity second digit left foot    ROS      Objective:  Physical Exam neurovascular status was found to be intact with good digital perfusion patient's noted to have large structural bunion deformity left with redness deviation of the hallux and rigid contracture digit 2 left with pain in the head of the proximal phalanx and also contracture of the third digit left. Patient states that this is been ongoing and patient does have a history of A. 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 At this point I will contact the doctor concerning eliquis but I did tell her that most likely she'll stop 3-5 days prior to procedure. I then allowed her to read consent form reviewing correction and explained to her alternative treatments complications as listed fact there is no guarantees and I do not think I'll be able to get complete correction of deformity. Patient wants surgery understanding everything as outlined in the consent form and at this time after extensive review signs consent form with all instructions and understanding. I did dispense air fracture walker for the postoperative period today because I want her getting used to this prior to the procedure. I do want her to find a balance shoe on the other foot to lift and I want her to be able to practice putting this on and off and again if we did not given it to wear it would be required at the time of surgery which would be a more expensive device. Patient understands total recovery will take 6 months to one year for this particular procedure

## 2016-11-11 NOTE — Patient Instructions (Signed)
Pre-Operative Instructions  Congratulations, you have decided to take an important step towards improving your quality of life.  You can be assured that the doctors and staff at [facility] will be with you every step of the way.  Here are some important things you should know:  1. Plan to be at the surgery center/hospital at least 1 (one) hour prior to your scheduled time, unless otherwise directed by the surgical center/hospital staff.  You must have a responsible adult accompany you, remain during the surgery and drive you home.  Make sure you have directions to the surgical center/hospital to ensure you arrive on time. 2. If you are having surgery at [facility] or [facility] hospitals, you will need a copy of your medical history and physical form from your family physician within one month prior to the date of surgery. We will give you a form for your primary physician to complete.  3. We make every effort to accommodate the date you request for surgery.  However, there are times where surgery dates or times have to be moved.  We will contact you as soon as possible if a change in schedule is required.   4. No aspirin/ibuprofen for one week before surgery.  If you are on aspirin, any non-steroidal anti-inflammatory medications (Mobic, Aleve, Ibuprofen) should not be taken seven (7) days prior to your surgery.  You make take Tylenol for pain prior to surgery.  5. Medications - If you are taking daily heart and blood pressure medications, seizure, reflux, allergy, asthma, anxiety, pain or diabetes medications, make sure you notify the surgery center/hospital before the day of surgery so they can tell you which medications you should take or avoid the day of surgery. 6. No food or drink after midnight the night before surgery unless directed otherwise by surgical center/hospital staff. 7. No alcoholic beverages 24-hours prior to surgery.  No smoking 24-hours prior or 24-hours after  surgery. 8. Wear loose pants or shorts. They should be loose enough to fit over bandages, boots, and casts. 9. Don't wear slip-on shoes. Sneakers are preferred. 10. Bring your boot with you to the surgery center/hospital.  Also bring crutches or a walker if your physician has prescribed it for you.  If you do not have this equipment, it will be provided for you after surgery. 11. If you have not been contacted by the surgery center/hospital by the day before your surgery, call to confirm the date and time of your surgery. 12. Leave-time from work may vary depending on the type of surgery you have.  Appropriate arrangements should be made prior to surgery with your employer. 13. Prescriptions will be provided immediately following surgery by your doctor.  Fill these as soon as possible after surgery and take the medication as directed. Pain medications will not be refilled on weekends and must be approved by the doctor. 14. Remove nail polish on the operative foot and avoid getting pedicures prior to surgery. 15. Wash the night before surgery.  The night before surgery wash the foot and leg well with water and the antibacterial soap provided. Be sure to pay special attention to beneath the toenails and in between the toes.  Wash for at least three (3) minutes. Rinse thoroughly with water and dry well with a towel.  Perform this wash unless told not to do so by your physician.  Enclosed: 1 Ice pack (please put in freezer the night before surgery)   1 Hibiclens skin cleaner     Pre-op instructions  If you have any questions regarding the instructions, please do not hesitate to call our office.  [place]: [address], [place], [place] [address] -- [phone]  [place]: [address] [place], [place] [address] -- [phone]  [place]: [address]  [place], [place] [address] -- [phone]  [place]: [address], [place], [place] [address] -- [phone]  Website:  [email or web address] 

## 2016-11-12 ENCOUNTER — Other Ambulatory Visit: Payer: Self-pay | Admitting: Internal Medicine

## 2016-11-12 DIAGNOSIS — I4891 Unspecified atrial fibrillation: Secondary | ICD-10-CM

## 2016-11-17 DIAGNOSIS — M48062 Spinal stenosis, lumbar region with neurogenic claudication: Secondary | ICD-10-CM | POA: Diagnosis not present

## 2016-11-17 DIAGNOSIS — M4802 Spinal stenosis, cervical region: Secondary | ICD-10-CM | POA: Diagnosis not present

## 2016-11-21 ENCOUNTER — Encounter: Payer: Self-pay | Admitting: Internal Medicine

## 2016-11-21 ENCOUNTER — Encounter: Payer: Self-pay | Admitting: *Deleted

## 2016-11-21 ENCOUNTER — Telehealth: Payer: Self-pay | Admitting: Internal Medicine

## 2016-11-21 NOTE — Telephone Encounter (Signed)
New message      Pt is have foot surgery early next week and want to know if we have heard from Dr Paulla Dolly [place] holding her eliquis?  Please let her know

## 2016-11-21 NOTE — Telephone Encounter (Signed)
LM for patient that I have not received clearance request from Dr. Paulla Dolly at time of call but will route message to MD to address clearance/holding Eliquis

## 2016-11-21 NOTE — Telephone Encounter (Signed)
Patient notified of MD recommendations for surgery. Clearance sent via EPIC to Dr. Paulla Dolly (in-basket)

## 2016-11-21 NOTE — Telephone Encounter (Signed)
Ok to hold Eliquis 3 days prior to surgery. Restart >24 hours after.  DR. Lemmie Evens

## 2016-11-22 ENCOUNTER — Telehealth: Payer: Self-pay | Admitting: *Deleted

## 2016-11-22 NOTE — Telephone Encounter (Signed)
"  I'm one of Dr. Mellody Drown patients.  I'm scheduled for surgery on September 18.  I still do not know the time and I'd like to know that if you do.  Your office was going to contact Dr. Debara Pickett about when I'm to stop my Eliquis prior to surgery.  Will you call me please?"  I attempted to return her call.  I left her a message that someone from the surgical center will call her the Friday or Monday prior to the surgery date and give her the arrival time.  I also told her that I sent a letter to Dr. Debara Pickett yesterday regarding the Eliquis.

## 2016-11-24 ENCOUNTER — Telehealth: Payer: Self-pay | Admitting: *Deleted

## 2016-11-25 IMAGING — CR DG HIP (WITH OR WITHOUT PELVIS) 1V PORT*L*
4 series · 4 of 4 positions shown · non-contrast
Comparison: None.

CLINICAL DATA: Status post left hip replacement

EXAM:
LEFT HIP (WITH PELVIS) 1 VIEW PORTABLE

[AP (1 of 2)]
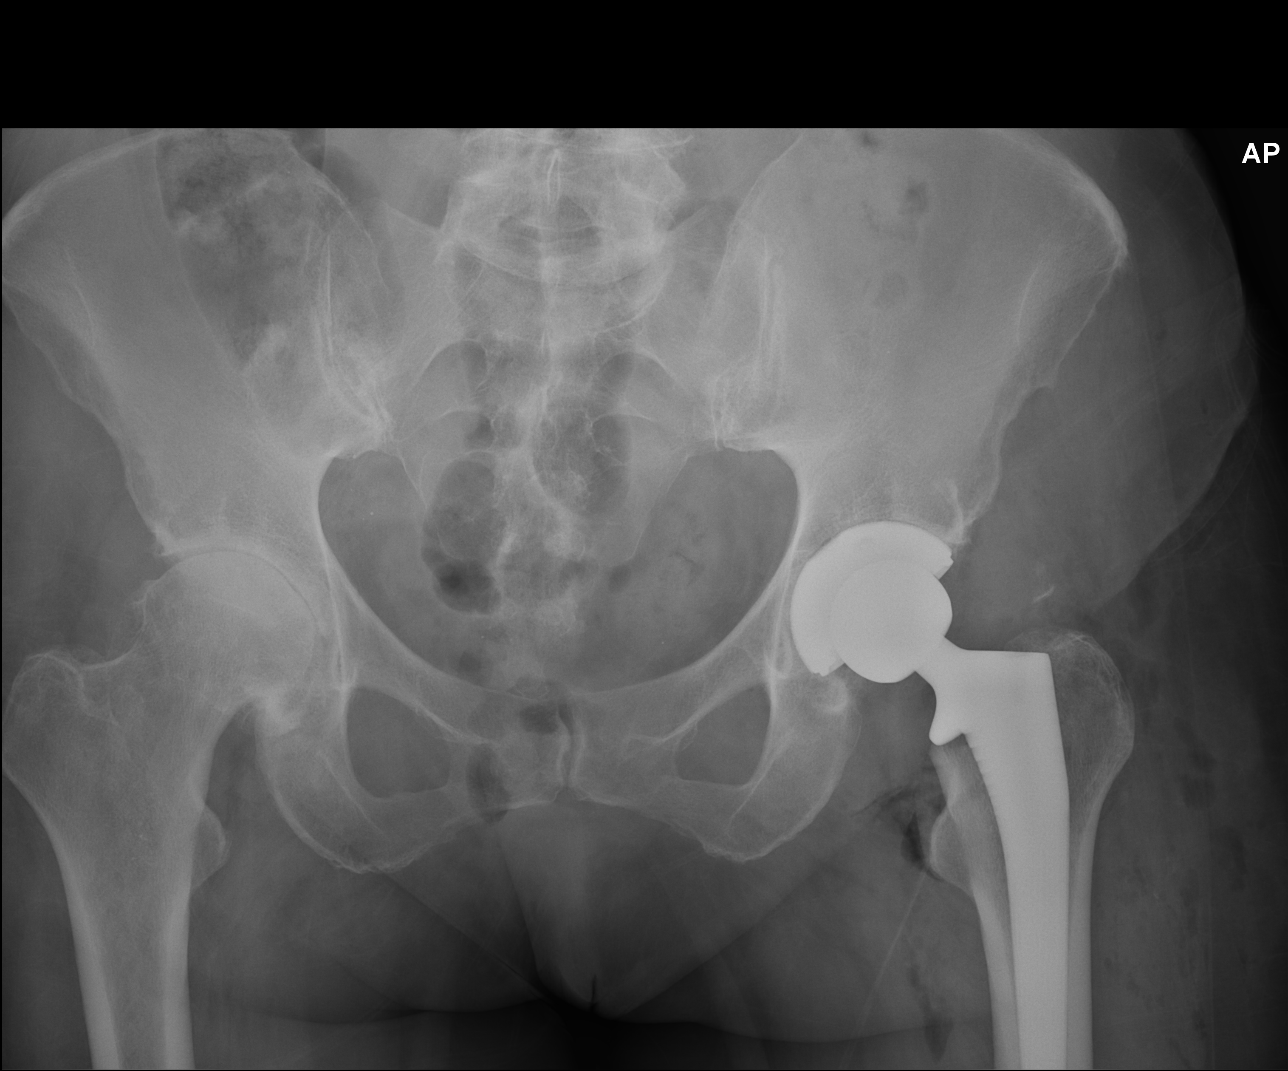

[xtable lateral (1 of 2)]
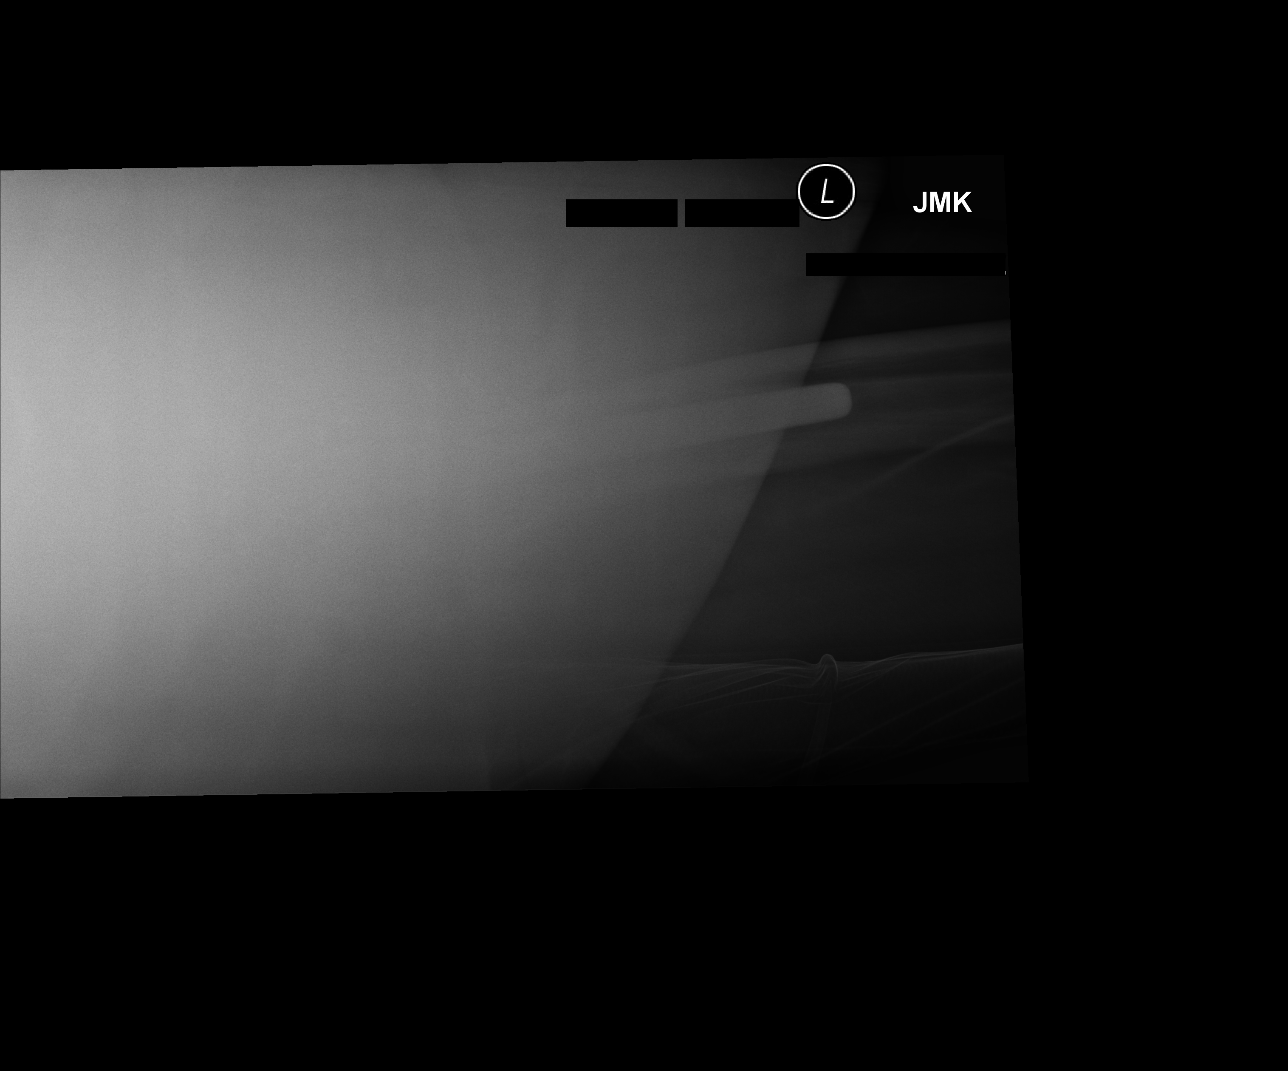

[AP (2 of 2)]
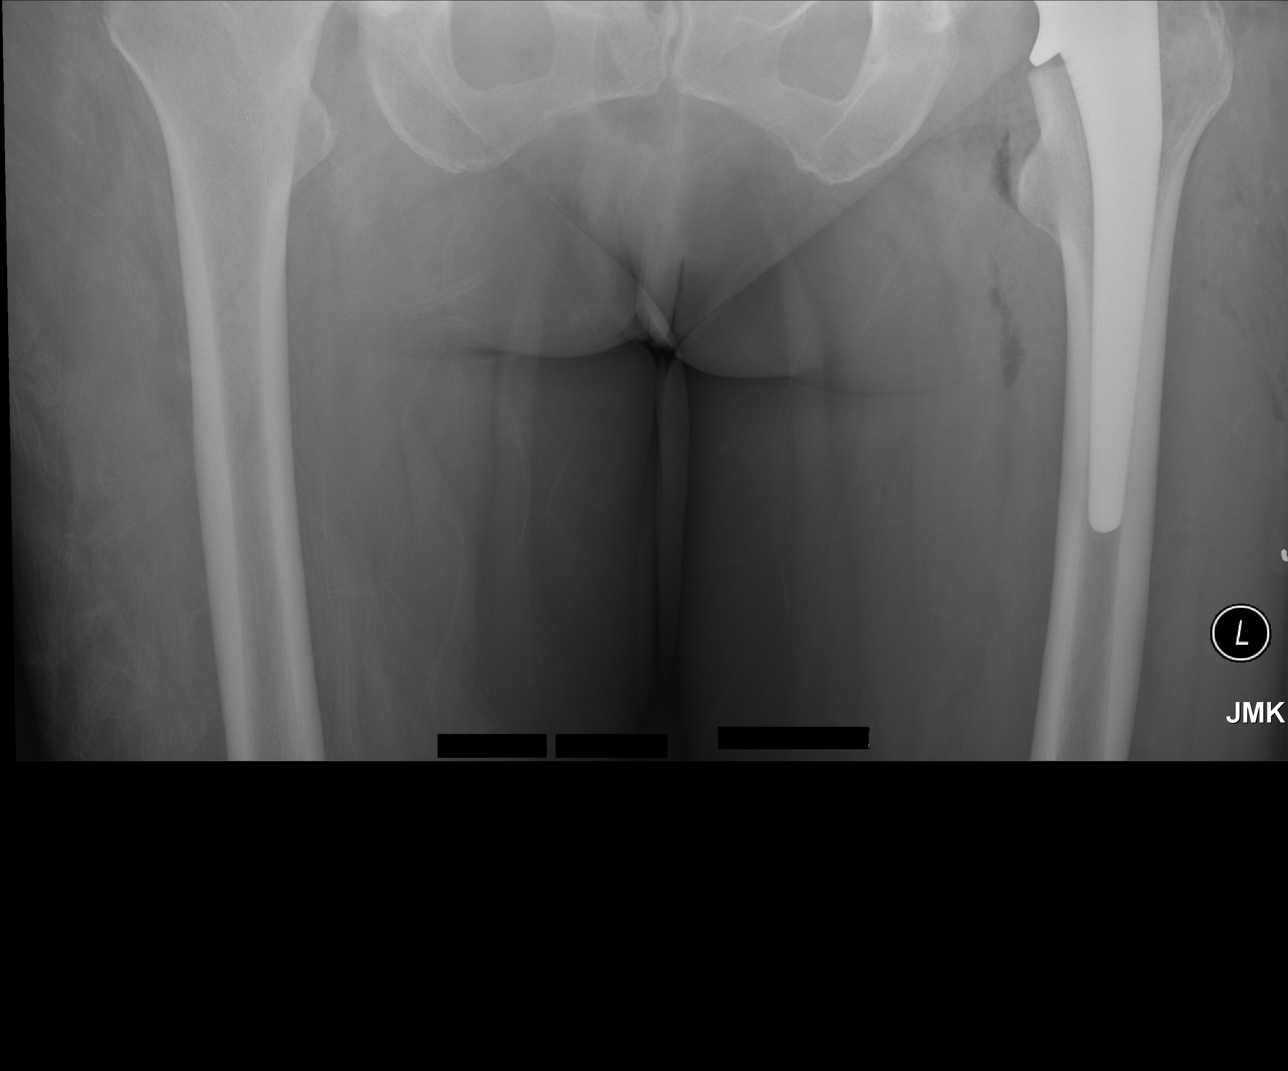

[xtable lateral (2 of 2)]
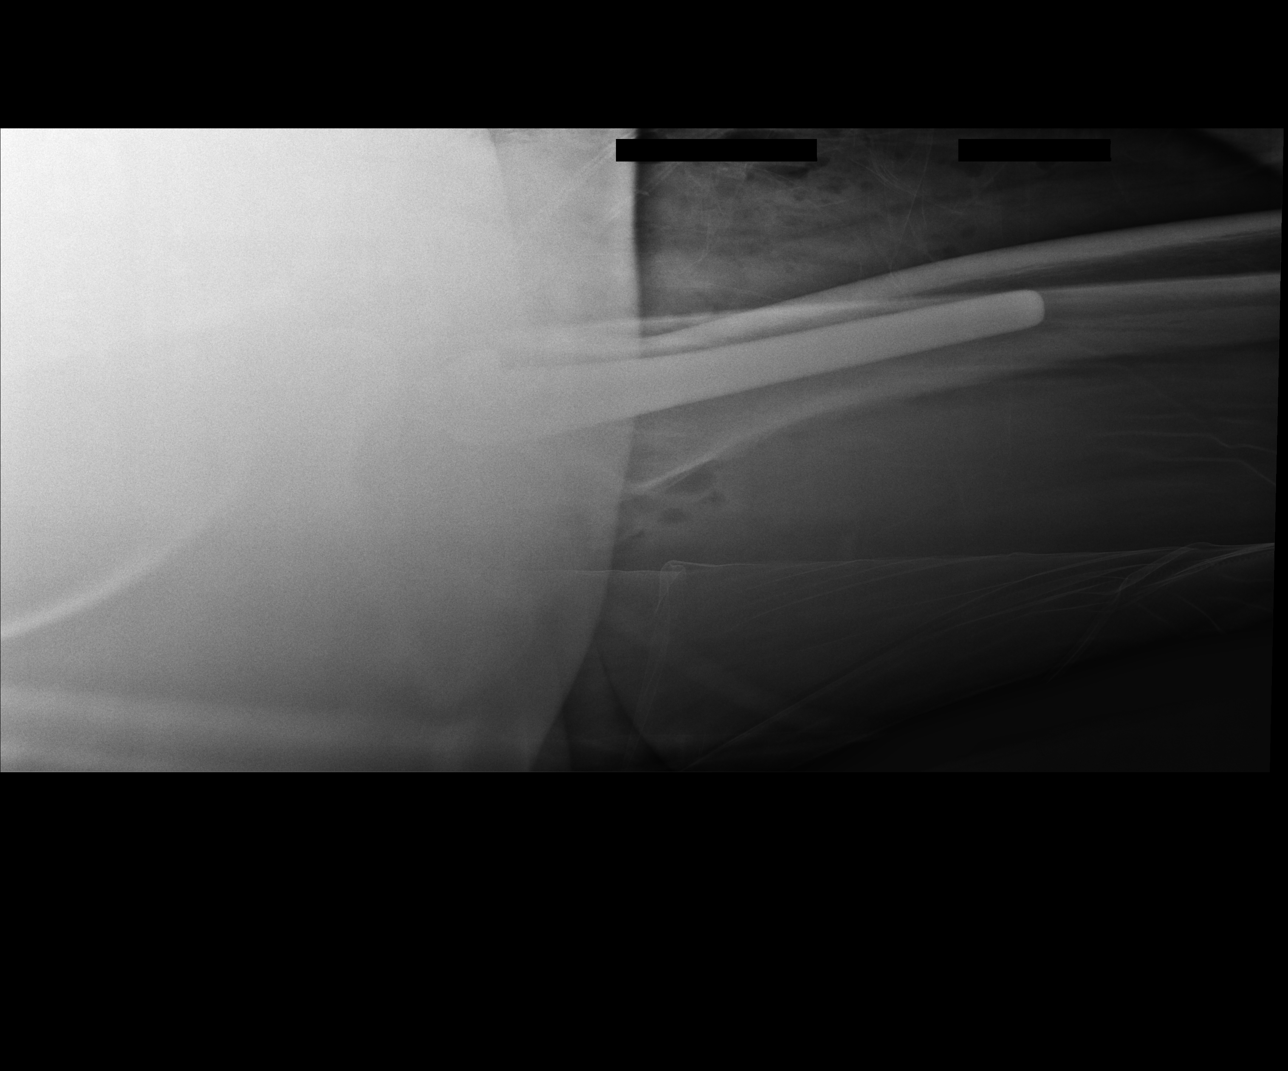

[4 of 4 positions shown; findings below may reference images not displayed]

FINDINGS: Left hip replacement is noted. No acute bony abnormality is seen.
Air is noted within the surgical bed. Degenerative changes of the
right hip are noted.
IMPRESSION: Status post left hip replacement.  No acute abnormality is noted.

## 2016-11-25 NOTE — Telephone Encounter (Signed)
Authorization request for surgery was sent to [employer].  Surgery is scheduled for 11/29/2016.  I called patient to make sure she got the instructions from Dr. Debara Pickett about the Eliquis.  He stated, "Ok to hold Eliquis 3 days prior to surgery. Restart >24 hours after."  She said she had already called Dr. Lysbeth Penner office and inquired.  She said today will be her last dose.

## 2016-11-29 ENCOUNTER — Encounter: Payer: Self-pay | Admitting: Podiatry

## 2016-11-29 DIAGNOSIS — M2042 Other hammer toe(s) (acquired), left foot: Secondary | ICD-10-CM | POA: Diagnosis not present

## 2016-11-29 DIAGNOSIS — M2022 Hallux rigidus, left foot: Secondary | ICD-10-CM | POA: Diagnosis not present

## 2016-11-29 DIAGNOSIS — M21612 Bunion of left foot: Secondary | ICD-10-CM | POA: Diagnosis not present

## 2016-11-29 DIAGNOSIS — M2012 Hallux valgus (acquired), left foot: Secondary | ICD-10-CM | POA: Diagnosis not present

## 2016-11-29 DIAGNOSIS — K219 Gastro-esophageal reflux disease without esophagitis: Secondary | ICD-10-CM | POA: Diagnosis not present

## 2016-11-30 ENCOUNTER — Telehealth: Payer: Self-pay | Admitting: Podiatry

## 2016-11-30 NOTE — Progress Notes (Signed)
DOS 11/29/2016 [place] Bunionectomy LT; Hammertoe Repair 2, 3, w/pin LT

## 2016-11-30 NOTE — Telephone Encounter (Signed)
Called patient at [phone] (Cell #) to check to see how they were doing from their surgery that was performed on Tuesday, November 29, 2016 with Dr. Paulla Dolly. Pt had an Liane Comber Bunionectomy LT and Hammertoe Repair 2, 3 w/pin LT.  Pt stated, "I am not taking Percocet because I did not like how it made me feel. I am just taking ibuprofen. Zofran is helping with nausea. Pain is a 6/10 and occasionally an 8/10 if I am up on my feet"  I advised patient not to be on their feet for more than 15 minutes at a time during the hour and to make sure they elevate their feet.  Pt stated, "I feel pretty good and had an excellent experience at the [facility]. Everyone at the [facility] was very attentive and made me feel like I was at a good place".  Pt did ask for a later appointment on Monday, September 24 with Dr. Paulla Dolly for her post op visit. She is currently supposed to come at 10:15 am and does not think she can come that early.  I gave a message to [facility] to have them call patient back to see if they could come in at 12 noon on Monday, December 05, 2016.

## 2016-12-01 ENCOUNTER — Encounter: Payer: Self-pay | Admitting: Family Medicine

## 2016-12-05 ENCOUNTER — Encounter: Payer: Self-pay | Admitting: Podiatry

## 2016-12-05 ENCOUNTER — Ambulatory Visit (INDEPENDENT_AMBULATORY_CARE_PROVIDER_SITE_OTHER): Payer: PPO

## 2016-12-05 ENCOUNTER — Ambulatory Visit (INDEPENDENT_AMBULATORY_CARE_PROVIDER_SITE_OTHER): Payer: PPO | Admitting: Podiatry

## 2016-12-05 ENCOUNTER — Encounter: Payer: PPO | Admitting: Podiatry

## 2016-12-05 VITALS — BP 122/72 | HR 68 | Temp 97.8°F

## 2016-12-05 DIAGNOSIS — M2042 Other hammer toe(s) (acquired), left foot: Secondary | ICD-10-CM | POA: Diagnosis not present

## 2016-12-05 DIAGNOSIS — M2010 Hallux valgus (acquired), unspecified foot: Secondary | ICD-10-CM

## 2016-12-06 NOTE — Progress Notes (Signed)
Subjective:    Patient ID: [name], female   DOB: 65 y.o.   MRN: [id number]   HPI patient states doing real well with minimal discomfort or swelling and able to walk with boot    ROS      Objective:  Physical Exam neurovascular status intact negative Homans sign noted with wound edges well coapted first MPJ left second and third toes with pins in place second and third digits     Assessment:    Doing well post surgical correction of left foot with wound edges well coapted     Plan:    H&P x-rays reviewed and instructed on continued elevation immobilization compression and reappoint 2 weeks suture removal or earlier if needed  X-rays indicate satisfactory alignment of the metatarsal with not complete correction but difficult foot structure with pins in place

## 2016-12-19 ENCOUNTER — Encounter: Payer: PPO | Admitting: Podiatry

## 2016-12-22 ENCOUNTER — Encounter: Payer: Self-pay | Admitting: Podiatry

## 2016-12-22 ENCOUNTER — Ambulatory Visit (INDEPENDENT_AMBULATORY_CARE_PROVIDER_SITE_OTHER): Payer: PPO | Admitting: Podiatry

## 2016-12-22 ENCOUNTER — Ambulatory Visit (INDEPENDENT_AMBULATORY_CARE_PROVIDER_SITE_OTHER): Payer: PPO

## 2016-12-22 ENCOUNTER — Other Ambulatory Visit: Payer: Self-pay | Admitting: Internal Medicine

## 2016-12-22 VITALS — BP 102/65 | HR 73 | Resp 16

## 2016-12-22 DIAGNOSIS — N3020 Other chronic cystitis without hematuria: Secondary | ICD-10-CM | POA: Diagnosis not present

## 2016-12-22 DIAGNOSIS — M2042 Other hammer toe(s) (acquired), left foot: Secondary | ICD-10-CM

## 2016-12-22 DIAGNOSIS — R350 Frequency of micturition: Secondary | ICD-10-CM | POA: Diagnosis not present

## 2016-12-22 NOTE — Progress Notes (Signed)
Subjective:    Patient ID: [name], female   DOB: 66 y.o.   MRN: [id number]   HPI patient states doing well and is here for stitch removal and states that she's walking with minimal discomfort    ROS      Objective:  Physical Exam neurovascular status intact negative Homans sign was noted with patient's left foot healing well with wound edges well coapted pins in place and good alignment of the underlying metatarsal     Assessment:    Doing well post forefoot reconstruction left     Plan:    Stitches removed x-rays were taken and discussed again were not able to get full correction but clinically it looks much better and she's very pleased with the appearance. Reappoint 2 weeks for pin removal  X-rays indicate that the osteotomy is healing well with slight under correction but overall it should be very stable at this position with pins in place second and third toes

## 2016-12-28 NOTE — Progress Notes (Signed)
HPI:  [name] is a pleasant 66 y.o. here for follow up. Chronic medical problems summarized below were reviewed for changes. These issues and their treatment remain stable for the most part. Had bunion surgery and is recovering well. Saw Dr. Rolena Infante about her neck and is doing better with some physical therapy. Reports he told her she does not need surgery. She is considering osteopathic treatments may schedule this. She is currently on doxycycline for a UTI. She is considering prophylactic antibiotics with her urologist. Denies CP, SOB, DOE, treatment intolerance or new symptoms. Seeing [name] for AWV today.  Due for labs: bmp, cbc and b12 for macrocytosis on review labs from cardiology  Neck pain: -for many years but worsening -reports saw [facility] in [place] whom advised urgent surgery for DD, then saw Dr. Beverely Pace at [facility] and was told did not need surgery  -saw Dr. Rolena Infante in [place] 12/2016 and reports told to do PT and does NOT need surgery. Pt is helping, considering gentle osteopathic treatments.  A. Fib/HTN: -sees cardiologist for this -doing well recently -meds: eliquis, diltiazem 240 daily, flecainide 100bid, spironolactone 100 am/50 pm -denies any recent CP, SOB, palpitations, swelling  IBS-C/GERD: -reports doing better with changing diet  Recurrent UTIs: -sees urologist  ROS: See pertinent positives and negatives per HPI.  Past Medical History:  Diagnosis Date  . Arthritis   . Atrial fibrillation ([place])   . Constipation   . Difficult intubation    pt states that her neck needs to be in a neutral position,  scoliosis  . Family history of adverse reaction to anesthesia    nausea  . GERD (gastroesophageal reflux disease)    protonix, hx h. pylori  . History of uterine cancer 04/22/2014   sees Dr. Ronita Hipps; s/p complete hysterectomy  . Hx of colonic polyp   . 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 Melanoma [facility])    sees Dr. Renda Rolls in dermatology  .  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 Peripheral vascular disease ([place])   . PONV (postoperative nausea and vomiting)   . Recurrent UTI   . Recurrent UTI   . S/P hip replacement   . Scoliosis   . Thyroid nodule   . Urinary incontinence   . Venous insufficiency    s/p ablation    Past Surgical History:  Procedure Laterality Date  . ABDOMINAL HYSTERECTOMY  09/10/2013  . BUNIONECTOMY  10/1976  . COLONOSCOPY    . FRACTURE SURGERY  09/1964  . MELANOMA EXCISION  12/2011  . TOTAL HIP ARTHROPLASTY Left 06/17/2014   Procedure: LEFT TOTAL HIP ARTHROPLASTY ANTERIOR APPROACH;  Surgeon: Mcarthur Rossetti, MD;  Location: [place];  Service: Orthopedics;  Laterality: Left;  . TOTAL HIP ARTHROPLASTY Right 09/02/2014   Procedure: RIGHT TOTAL HIP ARTHROPLASTY ANTERIOR APPROACH;  Surgeon: Mcarthur Rossetti, MD;  Location: [place];  Service: Orthopedics;  Laterality: Right;  . VEIN SURGERY  05/2011   venous ablation     Family History  Problem Relation Age of Onset  . Uterine cancer Mother   . Diabetes Brother 23  . Hypertension Father 49  . Hyperlipidemia Father   . Cancer Father 73  . Epilepsy Father 40  . Arthritis Sister 31  . Cancer Maternal Grandmother   . Stroke Paternal Grandfather   . Arthritis Sister     Social History   Social History  . Marital status: Divorced  Spouse name: N/A  . Number of children: 2  . Years of education: JD   Occupational History  . lawyer    Social History Main Topics  . Smoking status: Never Smoker  . Smokeless tobacco: Never Used  . Alcohol use 0.0 oz/week     Comment: rarely  . Drug use: No  . Sexual activity: Not Asked   Other Topics Concern  . None   Social History Narrative   Work or School: retired Tour manager Situation: lives alone - takes care of twin grandchildren 7 months in 05/2015      Spiritual Beliefs:       Lifestyle: active,  healthy diet           Current Outpatient Prescriptions:  .  diltiazem (CARDIZEM) 60 MG tablet, Take 60 mg daily if needed for palpitations, Disp: 30 tablet, Rfl: 6 .  diltiazem (TIAZAC) 240 MG 24 hr capsule, TAKE 1 CAPSULE BY MOUTH EVERY DAY, Disp: 90 capsule, Rfl: 0 .  ELIQUIS 5 MG TABS tablet, TAKE 1 TABLET(5 MG) BY MOUTH TWICE DAILY, Disp: 180 tablet, Rfl: 1 .  flecainide (TAMBOCOR) 100 MG tablet, Take 1 tablet (100 mg total) by mouth 2 (two) times daily., Disp: 60 tablet, Rfl: 6 .  mirabegron ER (MYRBETRIQ) 50 MG TB24 tablet, Take 25 mg by mouth every other day. , Disp: , Rfl:  .  pantoprazole (PROTONIX) 40 MG tablet, TAKE 1 TABLET(40 MG) BY MOUTH DAILY, Disp: 90 tablet, Rfl: 1 .  triamcinolone cream (KENALOG) 0.1 %, Apply 1 application topically as needed (for dry skin). , Disp: , Rfl: 1 .  spironolactone (ALDACTONE) 50 MG tablet, Take 1 tablet (50 mg total) by mouth daily. Takes 100mg  every morning and 50mg  in the afternoon, Disp: 90 tablet, Rfl: 5  Current Facility-Administered Medications:  .  0.9 %  sodium chloride infusion, 500 mL, Intravenous, Continuous, Milus Banister, MD  EXAM:  Vitals:   12/29/16 0852  BP: 102/64  Pulse: 79  Temp: 97.9 F (36.6 C)    Body mass index is 33.19 kg/m.  GENERAL: vitals reviewed and listed above, alert, oriented, appears well hydrated and in no acute distress  HEENT: atraumatic, conjunttiva clear, no obvious abnormalities on inspection of external nose and ears  NECK: no obvious masses on inspection  LUNGS: clear to auscultation bilaterally, no wheezes, rales or rhonchi, good air movement  CV: HRRR, no peripheral edema  MS: moves all extremities without noticeable abnormality  PSYCH: pleasant and cooperative, no obvious depression or anxiety  ASSESSMENT AND PLAN:  Discussed the following assessment and plan:  Neck pain  Paroxysmal atrial fibrillation (HCC)  Hx of essential hypertension - Plan: Basic metabolic panel,  CBC with Differential/Platelet  Chronic venous insufficiency  Gastroesophageal reflux disease, esophagitis presence not specified  Recurrent UTI  Abnormal CBC - Plan: Vitamin B12  -we talked about her neck pain and she may consider an osteopathic evaluation and treatment, she also has chronic back issues as well, physical therapy is helping -We talked about her recurrent UTIs and causes of UTIs, she is frustrated with these, is considering prophylactic antibiotics. She has a history of struggling from constipation chronically, but this is improved with adding fiber to her diet. -Ordered lab work, but she wants to wait and do this when she is off antibiotic. -She plans to get her flu shot today and see Manuela Schwartz for her annual wellness visit. -Patient advised to return or notify a doctor  immediately if symptoms worsen or persist or new concerns arise.  Patient Instructions  BEFORE YOU LEAVE: -AWV with Manuela Schwartz -Flu shot -Schedule lab visit -Schedule osteopathic treatment visit if she wishes -follow up: 4 months      Brianna Esson, Jarrett Soho R., DO

## 2016-12-29 ENCOUNTER — Encounter: Payer: Self-pay | Admitting: Family Medicine

## 2016-12-29 ENCOUNTER — Ambulatory Visit (INDEPENDENT_AMBULATORY_CARE_PROVIDER_SITE_OTHER): Payer: PPO | Admitting: Family Medicine

## 2016-12-29 VITALS — BP 102/64 | HR 79 | Temp 97.9°F | Ht 69.5 in | Wt 228.0 lb

## 2016-12-29 DIAGNOSIS — I480 Paroxysmal atrial fibrillation: Secondary | ICD-10-CM | POA: Diagnosis not present

## 2016-12-29 DIAGNOSIS — R7989 Other specified abnormal findings of blood chemistry: Secondary | ICD-10-CM | POA: Diagnosis not present

## 2016-12-29 DIAGNOSIS — M542 Cervicalgia: Secondary | ICD-10-CM

## 2016-12-29 DIAGNOSIS — N390 Urinary tract infection, site not specified: Secondary | ICD-10-CM

## 2016-12-29 DIAGNOSIS — Z0000 Encounter for general adult medical examination without abnormal findings: Secondary | ICD-10-CM | POA: Diagnosis not present

## 2016-12-29 DIAGNOSIS — K219 Gastro-esophageal reflux disease without esophagitis: Secondary | ICD-10-CM

## 2016-12-29 DIAGNOSIS — I872 Venous insufficiency (chronic) (peripheral): Secondary | ICD-10-CM | POA: Diagnosis not present

## 2016-12-29 DIAGNOSIS — Z23 Encounter for immunization: Secondary | ICD-10-CM | POA: Diagnosis not present

## 2016-12-29 DIAGNOSIS — Z8679 Personal history of other diseases of the circulatory system: Secondary | ICD-10-CM | POA: Diagnosis not present

## 2016-12-29 NOTE — Progress Notes (Signed)
Subjective:   [name] is a 66 y.o. female who presents for Medicare Annual (Subsequent) preventive examination.  The Patient was informed that the wellness visit is to identify future health risk and educate and initiate measures that can reduce risk for increased disease through the lifespan.    Annual Wellness Assessment  Reports health as ok Had bunion surgery on right foot On the left foot this time  Enjoys her family and life is good   Preventive Screening -Counseling & Management  Medicare Annual Preventive Care Visit - Subsequent Last OV today   Colonoscopy 08/2016;  Dexa 2014 states it was normal  If you get your dexa report  Mammogram 05/2014 -  Had one at her ob-gyn in July 2018 Had the flu vaccine this am    VS reviewed;   Diet  Lives alone Always has breakfast; granola pumpkin seed and flax Yogurt and fruit and granola  Lunch; eats a salad w protein Supper; cook for her dtr and sometimes stay and eat At home; [place] and taco spices  Variety   Psychosocial Twins in the family   looks after grand dtr; (44 months old)  32 of 6    BMI 33.2   Exercise For back; does a series of stretches; Frog lifts for hips Leg lifts and tighten core  Will see Dr. Maudie Mercury for pain in her shoulder    Stressors: managing  Enjoys her grands with 2 twins and 58 yo and 2 other children with the other dtr  Sleep patterns: does not sleep well  Incontinent - especially at hs  has worked with PT and is medicine which has helped during the day Will see UR next week    Cardiac Risk Factors Addressed Being treated for trial fib  Hyperlipidemia - chol/ hdl - hdl 39  Diabetes - neg   Advanced Directives- completed   Patient Care Team: Lucretia Kern, DO as PCP - General (Family Medicine) Debara Pickett, Nadean Corwin, MD as Consulting Physician (Cardiology) Brien Few, MD as Consulting Physician (Obstetrics and Gynecology) Renda Rolls, Jennefer Bravo, MD as Referring  Physician (Dermatology) Ardis Hughs, MD as Attending Physician (Urology)     Cardiac Risk Factors include: advanced age (>28men, >70 women);obesity (BMI >30kg/m2)    Objective:     Vitals: BP 102/64 (BP Location: Left Arm, Patient Position: Sitting, Cuff Size: Large)   Pulse 79   Temp 97.9 F (36.6 C) (Oral)   Ht 5' 9.5" (1.765 m)   Wt 228 lb (103.4 kg)   BMI 33.19 kg/m   Body mass index is 33.19 kg/m.   Tobacco History  Smoking Status  . Never Smoker  Smokeless Tobacco  . Never Used     Counseling given: Yes   Past Medical History:  Diagnosis Date  . Arthritis   . Atrial fibrillation ([place])   . Constipation   . Difficult intubation    pt states that her neck needs to be in a neutral position,  scoliosis  . Family history of adverse reaction to anesthesia    nausea  . GERD (gastroesophageal reflux disease)    protonix, hx h. pylori  . History of uterine cancer 04/22/2014   sees Dr. Ronita Hipps; s/p complete hysterectomy  . Hx of colonic polyp   . 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 Melanoma [facility])    sees Dr. Renda Rolls in dermatology  . 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  Peripheral vascular disease ([place])   . PONV (postoperative nausea and vomiting)   . Recurrent UTI   . Recurrent UTI   . S/P hip replacement   . Scoliosis   . Thyroid nodule   . Urinary incontinence   . Venous insufficiency    s/p ablation   Past Surgical History:  Procedure Laterality Date  . ABDOMINAL HYSTERECTOMY  09/10/2013  . BUNIONECTOMY  10/1976  . COLONOSCOPY    . FRACTURE SURGERY  09/1964  . MELANOMA EXCISION  12/2011  . TOTAL HIP ARTHROPLASTY Left 06/17/2014   Procedure: LEFT TOTAL HIP ARTHROPLASTY ANTERIOR APPROACH;  Surgeon: Mcarthur Rossetti, MD;  Location: [place];  Service: Orthopedics;  Laterality: Left;  . TOTAL HIP ARTHROPLASTY Right 09/02/2014   Procedure: RIGHT TOTAL  HIP ARTHROPLASTY ANTERIOR APPROACH;  Surgeon: Mcarthur Rossetti, MD;  Location: [place];  Service: Orthopedics;  Laterality: Right;  . VEIN SURGERY  05/2011   venous ablation    Family History  Problem Relation Age of Onset  . Uterine cancer Mother   . Diabetes Brother 69  . Hypertension Father 51  . Hyperlipidemia Father   . Cancer Father 9  . Epilepsy Father 79  . Arthritis Sister 19  . Cancer Maternal Grandmother   . Stroke Paternal Grandfather   . Arthritis Sister    History  Sexual Activity  . Sexual activity: Not on file    Outpatient Encounter Prescriptions as of 12/29/2016  Medication Sig  . diltiazem (CARDIZEM) 60 MG tablet Take 60 mg daily if needed for palpitations  . diltiazem (TIAZAC) 240 MG 24 hr capsule TAKE 1 CAPSULE BY MOUTH EVERY DAY  . ELIQUIS 5 MG TABS tablet TAKE 1 TABLET(5 MG) BY MOUTH TWICE DAILY  . flecainide (TAMBOCOR) 100 MG tablet Take 1 tablet (100 mg total) by mouth 2 (two) times daily.  . mirabegron ER (MYRBETRIQ) 50 MG TB24 tablet Take 25 mg by mouth every other day.   . pantoprazole (PROTONIX) 40 MG tablet TAKE 1 TABLET(40 MG) BY MOUTH DAILY  . triamcinolone cream (KENALOG) 0.1 % Apply 1 application topically as needed (for dry skin).   Marland Kitchen spironolactone (ALDACTONE) 50 MG tablet Take 1 tablet (50 mg total) by mouth daily. Takes 100mg  every morning and 50mg  in the afternoon   Facility-Administered Encounter Medications as of 12/29/2016  Medication  . 0.9 %  sodium chloride infusion    Activities of Daily Living In your present state of health, do you have any difficulty performing the following activities: 12/29/2016  Hearing? Y  Vision? N  Difficulty concentrating or making decisions? N  Walking or climbing stairs? N  Dressing or bathing? N  Doing errands, shopping? N  Preparing Food and eating ? N  Using the Toilet? N  In the past six months, have you accidently leaked urine? Y  Do you have problems with loss of bowel control? N    Managing your Medications? N  Managing your Finances? N  Housekeeping or managing your Housekeeping? N  Some recent data might be hidden    Patient Care Team: Lucretia Kern, DO as PCP - General (Family Medicine) Debara Pickett Nadean Corwin, MD as Consulting Physician (Cardiology) Brien Few, MD as Consulting Physician (Obstetrics and Gynecology) Renda Rolls Jennefer Bravo, MD as Referring Physician (Dermatology) Ardis Hughs, MD as Attending Physician (Urology)    Assessment:     Exercise Activities and Dietary recommendations Current Exercise Habits: Home exercise routine, Type of exercise: Other - see comments (streching and  play with grandchildren )  Goals    . Weight (lb) < 200 lb (90.7 kg)          Go back to riding the stationary bike- try for 30 minutes 5 days a week       Fall Risk Fall Risk  12/29/2016 10/03/2014  Falls in the past year? No No   Depression Screen PHQ 2/9 Scores 12/29/2016 10/03/2014 06/03/2014  PHQ - 2 Score 0 0 1  PHQ- 9 Score - - 4     Cognitive Function   Ad8 score reviewed for issues:  Issues making decisions:  Less interest in hobbies / activities:  Repeats questions, stories (family complaining):  Trouble using ordinary gadgets (microwave, computer, phone):  Forgets the month or year:   Mismanaging finances:   Remembering appts:  Daily problems with thinking and/or memory: Ad8 score is=0       Immunization History  Administered Date(s) Administered  . Influenza, High Dose Seasonal PF 12/29/2016  . Influenza-Unspecified 03/15/2015, 03/12/2016  . Pneumococcal Conjugate-13 04/11/2016  . Td 10/12/2013   Screening Tests Health Maintenance  Topic Date Due  . Hepatitis C Screening  05/24/2025 (Originally 1950-08-30)  . PNA vac Low Risk Adult (2 of 2 - PPSV23) 04/11/2017  . MAMMOGRAM  09/12/2018  . COLONOSCOPY  08/31/2019  . TETANUS/TDAP  10/13/2023  . INFLUENZA VACCINE  Completed  . DEXA SCAN  Completed      Plan:      PCP Notes   Health Maintenance Educated regarding shingrix Updated Mammogram to July 2019 Will try to get her dexa scan from [place] and if she can't retrieve this, may repeat as it appears it was done prior to her being 89  Educated regarding tetanus     Abnormal Screens  BMI 33; wants to lose weight and will start exercising   Referrals  None   Patient concerns; None   Nurse Concerns; None  Next PCP apt Seen today     I have personally reviewed and noted the following in the patient's chart:   . Medical and social history . Use of alcohol, tobacco or illicit drugs  . Current medications and supplements . Functional ability and status . Nutritional status . Physical activity . Advanced directives . List of other physicians . Hospitalizations, surgeries, and ER visits in previous 12 months . Vitals . Screenings to include cognitive, depression, and falls . Referrals and appointments  In addition, I have reviewed and discussed with patient certain preventive protocols, quality metrics, and best practice recommendations. A written personalized care plan for preventive services as well as general preventive health recommendations were provided to patient.     Wynetta Fines, RN  12/29/2016

## 2016-12-29 NOTE — Patient Instructions (Addendum)
BEFORE YOU LEAVE: -AWV with [name] -Flu shot -Schedule lab visit -Schedule osteopathic treatment visit if she wishes -follow up: 4 months   [name] , Thank you for taking time to come for your Medicare Wellness Visit. I appreciate your ongoing commitment to your health goals. Please review the following plan we discussed and let me know if I can assist you in the future.   Will try to get a report of your bone density in [place] if you can  Went to [employer] [facility]; gave her exercises every day for degenerative disc disease   Shingrix is a vaccine for the prevention of Shingles in Adults 50 and older.  If you are on Medicare, you can request a prescription from your doctor to be filled at a pharmacy.  Please check with your benefits regarding applicable copays or out of pocket expenses.  The Shingrix is given in 2 vaccines approx 8 weeks apart. You must receive the 2nd dose prior to 6 months from receipt of the first.   A Tetanus is recommended every 10 years. Medicare covers a tetanus if you have a cut or wound; otherwise, there may be a charge. If you had not had a tetanus with pertusses, known as the Tdap, you can take this anytime.  <Medicare does for a hearing screen Deaf & [facility] of Hearing [facility] Services - can assist with hearing aid x 1  No reviews  [address] Office  [phone]  [phone]  Just for awareness; these are the memory issues that may be of concern if you have more than 2 of them   Ad8 score reviewed for issues:  Issues making decisions:  Less interest in hobbies / activities:  Repeats questions, stories (family complaining):  Trouble using ordinary gadgets (microwave, computer, phone):  Forgets the month or year:   Mismanaging finances:   Remembering appts:  Daily problems with thinking and/or memory: Ad8 score is=0  Prevention of falls: Remove rugs or any tripping hazards in the home Use Non slip mats in bathtubs and  showers Placing grab bars next to the toilet and or shower Placing handrails on both sides of the stair way Adding extra lighting in the home.   Personal safety issues reviewed:  1. Consider starting a community watch program per [facility] 2.  Changes batteries is smoke detector and/or carbon monoxide detector  3.  If you have firearms; keep them in a safe place 4.  Wear protection when in the sun; Always wear sunscreen or a hat; It is good to have your doctor check your skin annually or review any new areas of concern 5. Driving safety; Keep in the right lane; stay 3 car lengths behind the car in front of you on the highway; look 3 times prior to pulling out; carry your cell phone everywhere you go!    Learn about the Yellow Dot program:  The program allows first responders at your emergency to have access to who your physician is, as well as your medications and medical conditions.  Citizens requesting the Yellow Dot Packages should contact Master Corporal Nunzio Cobbs at the [facility] [phone] for the first week of the program and beginning the week after Easter citizens should contact their Scientist, physiological.       These are the goals we discussed: Goals    . Weight (lb) < 200 lb (90.7 kg)  Go back to riding the stationary bike- try for 30 minutes 5 days a week        This is a list of the screening recommended for you and due dates:  Health Maintenance  Topic Date Due  .  Hepatitis C: One time screening is recommended by Center for Disease Control  (CDC) for  adults born from [birth year] through 1966.   05/24/2025*  . Pneumonia vaccines (2 of 2 - PPSV23) 04/11/2017  . Mammogram  09/12/2018  . Colon Cancer Screening  08/31/2019  . Tetanus Vaccine  10/13/2023  . Flu Shot  Completed  . DEXA scan (bone density measurement)  Completed  *Topic was postponed. The date shown is not the original due date.    Health  Maintenance for Postmenopausal Women Menopause is a normal process in which your reproductive ability comes to an end. This process happens gradually over a span of months to years, usually between the ages of 45 and 34. Menopause is complete when you have missed 12 consecutive menstrual periods. It is important to talk with your health care provider about some of the most common conditions that affect postmenopausal women, such as heart disease, cancer, and bone loss (osteoporosis). Adopting a healthy lifestyle and getting preventive care can help to promote your health and wellness. Those actions can also lower your chances of developing some of these common conditions. What should I know about menopause? During menopause, you may experience a number of symptoms, such as:  Moderate-to-severe hot flashes.  Night sweats.  Decrease in sex drive.  Mood swings.  Headaches.  Tiredness.  Irritability.  Memory problems.  Insomnia.  Choosing to treat or not to treat menopausal changes is an individual decision that you make with your health care provider. What should I know about hormone replacement therapy and supplements? Hormone therapy products are effective for treating symptoms that are associated with menopause, such as hot flashes and night sweats. Hormone replacement carries certain risks, especially as you become older. If you are thinking about using estrogen or estrogen with progestin treatments, discuss the benefits and risks with your health care provider. What should I know about heart disease and stroke? Heart disease, heart attack, and stroke become more likely as you age. This may be due, in part, to the hormonal changes that your body experiences during menopause. These can affect how your body processes dietary fats, triglycerides, and cholesterol. Heart attack and stroke are both medical emergencies. There are many things that you can do to help prevent heart disease and  stroke:  Have your blood pressure checked at least every 1-2 years. High blood pressure causes heart disease and increases the risk of stroke.  If you are 38-39 years old, ask your health care provider if you should take aspirin to prevent a heart attack or a stroke.  Do not use any tobacco products, including cigarettes, chewing tobacco, or electronic cigarettes. If you need help quitting, ask your health care provider.  It is important to eat a healthy diet and maintain a healthy weight. ? Be sure to include plenty of vegetables, fruits, low-fat dairy products, and lean protein. ? Avoid eating foods that are high in solid fats, added sugars, or salt (sodium).  Get regular exercise. This is one of the most important things that you can do for your health. ? Try to exercise for at least 150 minutes each week. The type of exercise that you do should increase your heart rate and make you  sweat. This is known as moderate-intensity exercise. ? Try to do strengthening exercises at least twice each week. Do these in addition to the moderate-intensity exercise.  Know your numbers.Ask your health care provider to check your cholesterol and your blood glucose. Continue to have your blood tested as directed by your health care provider.  What should I know about cancer screening? There are several types of cancer. Take the following steps to reduce your risk and to catch any cancer development as early as possible. Breast Cancer  Practice breast self-awareness. ? This means understanding how your breasts normally appear and feel. ? It also means doing regular breast self-exams. Let your health care provider know about any changes, no matter how small.  If you are 36 or older, have a clinician do a breast exam (clinical breast exam or CBE) every year. Depending on your age, family history, and medical history, it may be recommended that you also have a yearly breast X-ray (mammogram).  If you have  a family history of breast cancer, talk with your health care provider about genetic screening.  If you are at high risk for breast cancer, talk with your health care provider about having an MRI and a mammogram every year.  Breast cancer (BRCA) gene test is recommended for women who have family members with BRCA-related cancers. Results of the assessment will determine the need for genetic counseling and BRCA1 and for BRCA2 testing. BRCA-related cancers include these types: ? Breast. This occurs in males or females. ? Ovarian. ? Tubal. This may also be called fallopian tube cancer. ? Cancer of the abdominal or pelvic lining (peritoneal cancer). ? Prostate. ? Pancreatic.  Cervical, Uterine, and Ovarian Cancer Your health care provider may recommend that you be screened regularly for cancer of the pelvic organs. These include your ovaries, uterus, and vagina. This screening involves a pelvic exam, which includes checking for microscopic changes to the surface of your cervix (Pap test).  For women ages 21-65, health care providers may recommend a pelvic exam and a Pap test every three years. For women ages 71-65, they may recommend the Pap test and pelvic exam, combined with testing for human papilloma virus (HPV), every five years. Some types of HPV increase your risk of cervical cancer. Testing for HPV may also be done on women of any age who have unclear Pap test results.  Other health care providers may not recommend any screening for nonpregnant women who are considered low risk for pelvic cancer and have no symptoms. Ask your health care provider if a screening pelvic exam is right for you.  If you have had past treatment for cervical cancer or a condition that could lead to cancer, you need Pap tests and screening for cancer for at least 20 years after your treatment. If Pap tests have been discontinued for you, your risk factors (such as having a new sexual partner) need to be reassessed to  determine if you should start having screenings again. Some women have medical problems that increase the chance of getting cervical cancer. In these cases, your health care provider may recommend that you have screening and Pap tests more often.  If you have a family history of uterine cancer or ovarian cancer, talk with your health care provider about genetic screening.  If you have vaginal bleeding after reaching menopause, tell your health care provider.  There are currently no reliable tests available to screen for ovarian cancer.  Lung Cancer Lung cancer screening is  recommended for adults 26-62 years old who are at high risk for lung cancer because of a history of smoking. A yearly low-dose CT scan of the lungs is recommended if you:  Currently smoke.  Have a history of at least 30 pack-years of smoking and you currently smoke or have quit within the past 15 years. A pack-year is smoking an average of one pack of cigarettes per day for one year.  Yearly screening should:  Continue until it has been 15 years since you quit.  Stop if you develop a health problem that would prevent you from having lung cancer treatment.  Colorectal Cancer  This type of cancer can be detected and can often be prevented.  Routine colorectal cancer screening usually begins at age 8 and continues through age 39.  If you have risk factors for colon cancer, your health care provider may recommend that you be screened at an earlier age.  If you have a family history of colorectal cancer, talk with your health care provider about genetic screening.  Your health care provider may also recommend using home test kits to check for hidden blood in your stool.  A small camera at the end of a tube can be used to examine your colon directly (sigmoidoscopy or colonoscopy). This is done to check for the earliest forms of colorectal cancer.  Direct examination of the colon should be repeated every 5-10 years  until age 79. However, if early forms of precancerous polyps or small growths are found or if you have a family history or genetic risk for colorectal cancer, you may need to be screened more often.  Skin Cancer  Check your skin from head to toe regularly.  Monitor any moles. Be sure to tell your health care provider: ? About any new moles or changes in moles, especially if there is a change in a mole's shape or color. ? If you have a mole that is larger than the size of a pencil eraser.  If any of your family members has a history of skin cancer, especially at a young age, talk with your health care provider about genetic screening.  Always use sunscreen. Apply sunscreen liberally and repeatedly throughout the day.  Whenever you are outside, protect yourself by wearing long sleeves, pants, a wide-brimmed hat, and sunglasses.  What should I know about osteoporosis? Osteoporosis is a condition in which bone destruction happens more quickly than new bone creation. After menopause, you may be at an increased risk for osteoporosis. To help prevent osteoporosis or the bone fractures that can happen because of osteoporosis, the following is recommended:  If you are 91-1 years old, get at least 1,000 mg of calcium and at least 600 mg of vitamin D per day.  If you are older than age 53 but younger than age 25, get at least 1,200 mg of calcium and at least 600 mg of vitamin D per day.  If you are older than age 80, get at least 1,200 mg of calcium and at least 800 mg of vitamin D per day.  Smoking and excessive alcohol intake increase the risk of osteoporosis. Eat foods that are rich in calcium and vitamin D, and do weight-bearing exercises several times each week as directed by your health care provider. What should I know about how menopause affects my mental health? Depression may occur at any age, but it is more common as you become older. Common symptoms of depression include:  Low or sad  mood.  Changes in sleep patterns.  Changes in appetite or eating patterns.  Feeling an overall lack of motivation or enjoyment of activities that you previously enjoyed.  Frequent crying spells.  Talk with your health care provider if you think that you are experiencing depression. What should I know about immunizations? It is important that you get and maintain your immunizations. These include:  Tetanus, diphtheria, and pertussis (Tdap) booster vaccine.  Influenza every year before the flu season begins.  Pneumonia vaccine.  Shingles vaccine.  Your health care provider may also recommend other immunizations. This information is not intended to replace advice given to you by your health care provider. Make sure you discuss any questions you have with your health care provider. Document Released: 04/22/2005 Document Revised: 09/18/2015 Document Reviewed: 12/02/2014 Elsevier Interactive Patient Education  2018 [employer] in the Home Falls can cause injuries and can affect people from all age groups. There are many simple things that you can do to make your home safe and to help prevent falls. What can I do on the outside of my home?  Regularly repair the edges of walkways and driveways and fix any cracks.  Remove high doorway thresholds.  Trim any shrubbery on the main path into your home.  Use bright outdoor lighting.  Clear walkways of debris and clutter, including tools and rocks.  Regularly check that handrails are securely fastened and in good repair. Both sides of any steps should have handrails.  Install guardrails along the edges of any raised decks or porches.  Have leaves, snow, and ice cleared regularly.  Use sand or salt on walkways during winter months.  In the garage, clean up any spills right away, including grease or oil spills. What can I do in the bathroom?  Use night lights.  Install grab bars by the toilet and in the  tub and shower. Do not use towel bars as grab bars.  Use non-skid mats or decals on the floor of the tub or shower.  If you need to sit down while you are in the shower, use a plastic, non-slip stool.  Keep the floor dry. Immediately clean up any water that spills on the floor.  Remove soap buildup in the tub or shower on a regular basis.  Attach bath mats securely with double-sided non-slip rug tape.  Remove throw rugs and other tripping hazards from the floor. What can I do in the bedroom?  Use night lights.  Make sure that a bedside light is easy to reach.  Do not use oversized bedding that drapes onto the floor.  Have a firm chair that has side arms to use for getting dressed.  Remove throw rugs and other tripping hazards from the floor. What can I do in the kitchen?  Clean up any spills right away.  Avoid walking on wet floors.  Place frequently used items in easy-to-reach places.  If you need to reach for something above you, use a sturdy step stool that has a grab bar.  Keep electrical cables out of the way.  Do not use floor polish or wax that makes floors slippery. If you have to use wax, make sure that it is non-skid floor wax.  Remove throw rugs and other tripping hazards from the floor. What can I do in the stairways?  Do not leave any items on the stairs.  Make sure that there are handrails on both sides of the stairs. Fix handrails that  are broken or loose. Make sure that handrails are as long as the stairways.  Check any carpeting to make sure that it is firmly attached to the stairs. Fix any carpet that is loose or worn.  Avoid having throw rugs at the top or bottom of stairways, or secure the rugs with carpet tape to prevent them from moving.  Make sure that you have a light switch at the top of the stairs and the bottom of the stairs. If you do not have them, have them installed. What are some other fall prevention tips?  Wear closed-toe shoes  that fit well and support your feet. Wear shoes that have rubber soles or low heels.  When you use a stepladder, make sure that it is completely opened and that the sides are firmly locked. Have someone hold the ladder while you are using it. Do not climb a closed stepladder.  Add color or contrast paint or tape to grab bars and handrails in your home. Place contrasting color strips on the first and last steps.  Use mobility aids as needed, such as canes, walkers, scooters, and crutches.  Turn on lights if it is dark. Replace any light bulbs that burn out.  Set up furniture so that there are clear paths. Keep the furniture in the same spot.  Fix any uneven floor surfaces.  Choose a carpet design that does not hide the edge of steps of a stairway.  Be aware of any and all pets.  Review your medicines with your healthcare provider. Some medicines can cause dizziness or changes in blood pressure, which increase your risk of falling. Talk with your health care provider about other ways that you can decrease your risk of falls. This may include working with a physical therapist or trainer to improve your strength, balance, and endurance. This information is not intended to replace advice given to you by your health care provider. Make sure you discuss any questions you have with your health care provider. Document Released: 02/18/2002 Document Revised: 07/28/2015 Document Reviewed: 04/04/2014 Elsevier Interactive Patient Education  2017 [employer].

## 2016-12-29 NOTE — Addendum Note (Signed)
Addended by: Agnes Lawrence on: 12/29/2016 09:17 AM   Modules accepted: Orders

## 2016-12-30 NOTE — Progress Notes (Signed)
Shelly Kasper R., DO  

## 2017-01-05 ENCOUNTER — Ambulatory Visit (INDEPENDENT_AMBULATORY_CARE_PROVIDER_SITE_OTHER): Payer: PPO

## 2017-01-05 ENCOUNTER — Ambulatory Visit (INDEPENDENT_AMBULATORY_CARE_PROVIDER_SITE_OTHER): Payer: PPO | Admitting: Podiatry

## 2017-01-05 DIAGNOSIS — M2042 Other hammer toe(s) (acquired), left foot: Secondary | ICD-10-CM

## 2017-01-05 DIAGNOSIS — M2010 Hallux valgus (acquired), unspecified foot: Secondary | ICD-10-CM

## 2017-01-05 NOTE — Progress Notes (Signed)
Subjective:    Patient ID: [name], female   DOB: 66 y.o.   MRN: [id number]   HPI patient states doing very well with her left foot    ROS      Objective:  Physical Exam neurovascular status intact negative Homans sign noted with patient's left foot healing well with mild edema and pins in place second and third toes with good alignment noted     Assessment:    Doing well post forefoot reconstruction left     Plan:    H&P x-rays reviewed and reviewed pins from the second and third toes removing that entirely. Apply dressings to the end of the toes advised on continued elevation compression immobilization and reappoint 4 weeks or earlier if needed  X-rays indicate that there is good positional component with toes in place with pins in good alignment

## 2017-01-10 ENCOUNTER — Ambulatory Visit (INDEPENDENT_AMBULATORY_CARE_PROVIDER_SITE_OTHER): Payer: PPO | Admitting: Family Medicine

## 2017-01-10 ENCOUNTER — Encounter: Payer: Self-pay | Admitting: Family Medicine

## 2017-01-10 VITALS — BP 118/64 | HR 71 | Temp 97.5°F | Ht 69.5 in

## 2017-01-10 DIAGNOSIS — M545 Low back pain: Secondary | ICD-10-CM | POA: Diagnosis not present

## 2017-01-10 DIAGNOSIS — M25562 Pain in left knee: Secondary | ICD-10-CM

## 2017-01-10 DIAGNOSIS — M9901 Segmental and somatic dysfunction of cervical region: Secondary | ICD-10-CM

## 2017-01-10 DIAGNOSIS — M9902 Segmental and somatic dysfunction of thoracic region: Secondary | ICD-10-CM

## 2017-01-10 DIAGNOSIS — G8929 Other chronic pain: Secondary | ICD-10-CM

## 2017-01-10 DIAGNOSIS — M25511 Pain in right shoulder: Secondary | ICD-10-CM | POA: Diagnosis not present

## 2017-01-10 DIAGNOSIS — M9903 Segmental and somatic dysfunction of lumbar region: Secondary | ICD-10-CM | POA: Diagnosis not present

## 2017-01-10 DIAGNOSIS — M9904 Segmental and somatic dysfunction of sacral region: Secondary | ICD-10-CM

## 2017-01-10 DIAGNOSIS — M9906 Segmental and somatic dysfunction of lower extremity: Secondary | ICD-10-CM | POA: Diagnosis not present

## 2017-01-10 DIAGNOSIS — M542 Cervicalgia: Secondary | ICD-10-CM

## 2017-01-10 DIAGNOSIS — M9900 Segmental and somatic dysfunction of head region: Secondary | ICD-10-CM

## 2017-01-10 NOTE — Patient Instructions (Addendum)
BEFORE YOU LEAVE: -follow up: OMT 2 weeks -hamstring exercises  A good intro video is: "Independence from Pain 7-minute Video" - [email or web address]

## 2017-01-10 NOTE — Progress Notes (Signed)
HPI:  Here for evaluation and wants osteopathic treatment for numerous musculoskeletal complains. See details below, reviewed and updated today.  Neck pain: -for many years but worsening -reports saw [facility] in [place] whom advised urgent surgery for DD, then saw Dr. Beverely Pace at [facility] and was told did not need surgery  -saw Dr. Rolena Infante in [place] 12/2016 and reports told to do PT and does NOT need surgery. Pt is helping, considering gentle osteopathic treatments. -hx hip replacement for OA -no radiation to the extremities, weakness or numbness in the upper extremities, and ability to turn the head and neck, dizziness  Right trapezius muscle mid fiber pain: -For about 18 months after she fell on this right side -Muscle soreness and tension in the right trapezius muscle region -No radiation to the arm, weakness or numbness in the arms  Low back pain: -Chronic, mainly in the left lower back muscles -She has had MRIs the back and reports she has mild scoliosis -She saw Dr. Dianna Limbo in the past for this and had some injections and nerve ablation -In the past she had mild radiation into the extremity, no longer experiences this -No weakness, numbness, bowel or bladder dysfunction from this -worsened recently after wearing a boot for foot surgery  Posterior left knee pain: -For several months -no swelling, weakness or numbness, clicking, locking or giving away -She has seen her orthopedic specialist for right knee pain in the   ROS: See pertinent positives and negatives per HPI.  Past Medical History:  Diagnosis Date  . Arthritis   . Atrial fibrillation ([place])   . Constipation   . Difficult intubation    pt states that her neck needs to be in a neutral position,  scoliosis  . Family history of adverse reaction to anesthesia    nausea  . GERD (gastroesophageal reflux disease)    protonix, hx h. pylori  . History of uterine cancer 04/22/2014   sees Dr. Ronita Hipps; s/p complete  hysterectomy  . Hx of colonic polyp   . 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 Melanoma [facility])    sees Dr. Renda Rolls in dermatology  . 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 Peripheral vascular disease ([place])   . PONV (postoperative nausea and vomiting)   . Recurrent UTI   . Recurrent UTI   . S/P hip replacement   . Scoliosis   . Thyroid nodule   . Urinary incontinence   . Venous insufficiency    s/p ablation    Past Surgical History:  Procedure Laterality Date  . ABDOMINAL HYSTERECTOMY  09/10/2013  . BUNIONECTOMY  10/1976  . COLONOSCOPY    . FRACTURE SURGERY  09/1964  . MELANOMA EXCISION  12/2011  . TOTAL HIP ARTHROPLASTY Left 06/17/2014   Procedure: LEFT TOTAL HIP ARTHROPLASTY ANTERIOR APPROACH;  Surgeon: Mcarthur Rossetti, MD;  Location: [place];  Service: Orthopedics;  Laterality: Left;  . TOTAL HIP ARTHROPLASTY Right 09/02/2014   Procedure: RIGHT TOTAL HIP ARTHROPLASTY ANTERIOR APPROACH;  Surgeon: Mcarthur Rossetti, MD;  Location: [place];  Service: Orthopedics;  Laterality: Right;  . VEIN SURGERY  05/2011   venous ablation     Family History  Problem Relation Age of Onset  . Uterine cancer Mother   . Diabetes Brother 51  . Hypertension Father 47  . Hyperlipidemia Father   . Cancer Father 75  . Epilepsy Father 65  . Arthritis Sister 17  .  Cancer Maternal Grandmother   . Stroke Paternal Grandfather   . Arthritis Sister     Social History   Social History  . Marital status: Divorced    Spouse name: N/A  . Number of children: 2  . Years of education: JD   Occupational History  . lawyer    Social History Main Topics  . Smoking status: Never Smoker  . Smokeless tobacco: Never Used  . Alcohol use 0.0 oz/week     Comment: rarely at [facility]   . Drug use: No  . Sexual activity: Not Asked   Other Topics Concern  . None   Social History Narrative   Work or  School: retired Tour manager Situation: lives alone - takes care of twin grandchildren 7 months in 05/2015      Spiritual Beliefs:       Lifestyle: active, healthy diet           Current Outpatient Prescriptions:  .  diltiazem (CARDIZEM) 60 MG tablet, Take 60 mg daily if needed for palpitations, Disp: 30 tablet, Rfl: 6 .  diltiazem (TIAZAC) 240 MG 24 hr capsule, TAKE 1 CAPSULE BY MOUTH EVERY DAY, Disp: 90 capsule, Rfl: 0 .  ELIQUIS 5 MG TABS tablet, TAKE 1 TABLET(5 MG) BY MOUTH TWICE DAILY, Disp: 180 tablet, Rfl: 1 .  flecainide (TAMBOCOR) 100 MG tablet, Take 1 tablet (100 mg total) by mouth 2 (two) times daily., Disp: 60 tablet, Rfl: 6 .  mirabegron ER (MYRBETRIQ) 50 MG TB24 tablet, Take 25 mg by mouth every other day. , Disp: , Rfl:  .  pantoprazole (PROTONIX) 40 MG tablet, TAKE 1 TABLET(40 MG) BY MOUTH DAILY, Disp: 90 tablet, Rfl: 1 .  triamcinolone cream (KENALOG) 0.1 %, Apply 1 application topically as needed (for dry skin). , Disp: , Rfl: 1 .  spironolactone (ALDACTONE) 50 MG tablet, Take 1 tablet (50 mg total) by mouth daily. Takes 100mg  every morning and 50mg  in the afternoon, Disp: 90 tablet, Rfl: 5  Current Facility-Administered Medications:  .  0.9 %  sodium chloride infusion, 500 mL, Intravenous, Continuous, Milus Banister, MD  EXAM:  Vitals:   01/10/17 1556  BP: 118/64  Pulse: 71  Temp: (!) 97.5 F (36.4 C)    There is no height or weight on file to calculate BMI.  GENERAL: vitals reviewed and listed above, alert, oriented, appears well hydrated and in no acute distress  HEENT: atraumatic, conjunttiva clear, no obvious abnormalities on inspection of external nose and ears  NECK: no obvious masses on inspection, negative Spurling, negative vertebral artery insufficiency testing  MS: moves all extremities without noticeable abnormality, standing inspection she has significant collapse along arch of both feet with valgus ankle deformity that is greater on  the right than the left, she has mild scoliosis of the back, she has had anterior posture about an inch and a half, her right shoulder is elevated about 1 inch compared to left, she has normal strength and sensitivity to light touch in all extremities, negative Spurling, negative hip compression testing, T2-4 are rotated right side bent left, L 4 through 5 are rotated right side bent right, left on left sacral torsion pelvic girdle, rotated right, left tib-fib internal rotated, OA rotated right, thoracic inlet rotated left, right trapezius mid fiber tender point, suboccipital muscle tension,Tight hamstrings bilaterally, no appreciable bulging of the posterior knee, no significant tenderness to palpation on exam of the knees, stable knee joint without laxity  PSYCH: pleasant and cooperative, no obvious depression or anxiety  ASSESSMENT AND PLAN:  Discussed the following assessment and plan:  Neck pain Chronic right shoulder pain Chronic left-sided low back pain without sciatica -discussed various etiologies -HEP - video link provided -continue care with specialist as needed -osteopathic treatment per below  Posterior left knee pain -discussed various etiologies -hamstring stretches -may need to consider imaging or sports med eval if persists  Somatic dysfunction of the head, neck, thoracic, lumbar, sacral, LE regions. OMT per below.  PROCEDURE NOTE : OSTEOPATHIC TREATMENT The decision today to treat with gentle Osteopathic Manipulative Therapy  (OMT) was based on physical exam findings. Verbal consent was  obtained after after explanation of risks and benefits. No Cervical HVLA manipulation was performed. After consent was obtained, treatment was  performed as below:      Regions treated: head, neck, thoracic, lumbar, sacral, LE      Techniques used: cranial, counterstrain, myofascial release The patient tolerated the treatment well and reported Improved  symptoms  following treatment today. Follow up treatment was advised in: 1-2 weeks   -Patient advised to return or notify a doctor immediately if symptoms worsen or persist or new concerns arise.  Patient Instructions  BEFORE YOU LEAVE: -follow up: OMT 2 weeks -hamstring exercises  A good intro video is: "Independence from Pain 7-minute Video" - [email or web address]    Colin Benton R., DO

## 2017-01-15 ENCOUNTER — Other Ambulatory Visit: Payer: Self-pay | Admitting: Cardiology

## 2017-01-16 DIAGNOSIS — N3020 Other chronic cystitis without hematuria: Secondary | ICD-10-CM | POA: Diagnosis not present

## 2017-01-16 DIAGNOSIS — N3281 Overactive bladder: Secondary | ICD-10-CM | POA: Diagnosis not present

## 2017-01-24 ENCOUNTER — Ambulatory Visit: Payer: PPO | Admitting: Family Medicine

## 2017-01-24 ENCOUNTER — Encounter: Payer: Self-pay | Admitting: Family Medicine

## 2017-01-24 VITALS — BP 110/70 | HR 72 | Temp 97.8°F | Ht 69.5 in | Wt 235.2 lb

## 2017-01-24 DIAGNOSIS — M9902 Segmental and somatic dysfunction of thoracic region: Secondary | ICD-10-CM

## 2017-01-24 DIAGNOSIS — M9901 Segmental and somatic dysfunction of cervical region: Secondary | ICD-10-CM

## 2017-01-24 DIAGNOSIS — M549 Dorsalgia, unspecified: Secondary | ICD-10-CM | POA: Diagnosis not present

## 2017-01-24 DIAGNOSIS — M79606 Pain in leg, unspecified: Secondary | ICD-10-CM

## 2017-01-24 DIAGNOSIS — M9903 Segmental and somatic dysfunction of lumbar region: Secondary | ICD-10-CM | POA: Diagnosis not present

## 2017-01-24 DIAGNOSIS — M9900 Segmental and somatic dysfunction of head region: Secondary | ICD-10-CM

## 2017-01-24 DIAGNOSIS — M9906 Segmental and somatic dysfunction of lower extremity: Secondary | ICD-10-CM | POA: Diagnosis not present

## 2017-01-24 DIAGNOSIS — M9904 Segmental and somatic dysfunction of sacral region: Secondary | ICD-10-CM

## 2017-01-24 NOTE — Patient Instructions (Signed)
BEFORE YOU LEAVE: -follow up: about 1-2 weeks as needed -hamstring exercises  A good intro video is: "Independence from Pain 7-minute Video" - [email or web address]

## 2017-01-24 NOTE — Progress Notes (Signed)
HPI:  Here for follow-up regarding chronic neck and back issues, posterior knee pain.  She reports she felt great after her last osteopathic treatment session and was very impressed.  She is feeling a little achy today.  She has been taking care of her grandchild a lot lately and doing a lot of lifting 62-month-old.  She has had some pain in the right shoulder/upper back.  She has not been doing any of the exercises we provided last time as she lost the sheet.  She would like [place] to give these to her again.  Denies any weakness, numbness, bowel or bladder incontinence.  She does continue to have some pain in the hamstring region.  She wants to do osteopathic treatments again today.  ROS: See pertinent positives and negatives per HPI.  Past Medical History:  Diagnosis Date  . Arthritis   . Atrial fibrillation ([place])   . Constipation   . Difficult intubation    pt states that her neck needs to be in a neutral position,  scoliosis  . Family history of adverse reaction to anesthesia    nausea  . GERD (gastroesophageal reflux disease)    protonix, hx h. pylori  . History of uterine cancer 04/22/2014   sees Dr. Ronita Hipps; s/p complete hysterectomy  . Hx of colonic polyp   . 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 Melanoma [facility])    sees Dr. Renda Rolls in dermatology  . 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 Peripheral vascular disease ([place])   . PONV (postoperative nausea and vomiting)   . Recurrent UTI   . Recurrent UTI   . S/P hip replacement   . Scoliosis   . Thyroid nodule   . Urinary incontinence   . Venous insufficiency    s/p ablation    Past Surgical History:  Procedure Laterality Date  . ABDOMINAL HYSTERECTOMY  09/10/2013  . BUNIONECTOMY  10/1976  . COLONOSCOPY    . FRACTURE SURGERY  09/1964  . MELANOMA EXCISION  12/2011  . VEIN SURGERY  05/2011   venous ablation     Family History   Problem Relation Age of Onset  . Uterine cancer Mother   . Diabetes Brother 6  . Hypertension Father 62  . Hyperlipidemia Father   . Cancer Father 14  . Epilepsy Father 11  . Arthritis Sister 25  . Cancer Maternal Grandmother   . Stroke Paternal Grandfather   . Arthritis Sister     Social History   Socioeconomic History  . Marital status: Divorced    Spouse name: None  . Number of children: 2  . Years of education: JD  . Highest education level: None  Social Needs  . Financial resource strain: None  . Food insecurity - worry: None  . Food insecurity - inability: None  . Transportation needs - medical: None  . Transportation needs - non-medical: None  Occupational History  . Occupation: Chief Executive Officer  Tobacco Use  . Smoking status: Never Smoker  . Smokeless tobacco: Never Used  Substance and Sexual Activity  . Alcohol use: Yes    Alcohol/week: 0.0 oz    Comment: rarely at [facility]   . Drug use: No  . Sexual activity: None  Other Topics Concern  . 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  diltiazem (CARDIZEM) 60 MG tablet, Take 60 mg daily if needed for palpitations, Disp: 30 tablet, Rfl: 6 .  diltiazem (TIAZAC) 240 MG 24 hr capsule, TAKE 1 CAPSULE BY MOUTH EVERY DAY, Disp: 90 capsule, Rfl: 0 .  ELIQUIS 5 MG TABS tablet, TAKE 1 TABLET(5 MG) BY MOUTH TWICE DAILY, Disp: 180 tablet, Rfl: 1 .  flecainide (TAMBOCOR) 100 MG tablet, TAKE 1 TABLET(100 MG) BY MOUTH TWICE DAILY, Disp: 180 tablet, Rfl: 0 .  mirabegron ER (MYRBETRIQ) 50 MG TB24 tablet, Take 25 mg by mouth every other day. , Disp: , Rfl:  .  pantoprazole (PROTONIX) 40 MG tablet, TAKE 1 TABLET(40 MG) BY MOUTH DAILY, Disp: 90 tablet, Rfl: 1 .  triamcinolone cream (KENALOG) 0.1 %, Apply 1 application  topically as needed (for dry skin). , Disp: , Rfl: 1 .  spironolactone (ALDACTONE) 50 MG tablet, Take 1 tablet (50 mg total) by mouth daily. Takes 100mg  every morning and 50mg  in the afternoon, Disp: 90 tablet, Rfl: 5  Current Facility-Administered Medications:  .  0.9 %  sodium chloride infusion, 500 mL, Intravenous, Continuous, Milus Banister, MD  EXAM:  Vitals:   01/24/17 1624  BP: 110/70  Pulse: 72  Temp: 97.8 F (36.6 C)    Body mass index is 34.24 kg/m.  GENERAL: vitals reviewed and listed above, alert, oriented, appears well hydrated and in no acute distress  HEENT: atraumatic, conjunttiva clear, no obvious abnormalities on inspection of external nose and ears  NECK: no obvious masses on inspection  LUNGS: clear to auscultation bilaterally, no wheezes, rales or rhonchi, good air movement  CV: HRRR, no peripheral edema  MS: moves all extremities without noticeable abnormality, R tib fib int rotated, L hip ext rotated, L 5 SlRr, Sacrum L uni ext, T4-8 SrRl, R levator scapula tenderpoint, OA Rr, TI R L, R SBR SBS  PSYCH: pleasant and cooperative, no obvious depression or anxiety  ASSESSMENT AND PLAN:  Discussed the following assessment and plan:  Upper back pain  Pain of lower extremity, unspecified laterality  Somatic dysfunction of head region  Somatic dysfunction of spine, cervical  Somatic dysfunction of spine, thoracic  Somatic dysfunction of spine, lumbar  Somatic dysfunction of spine, sacral  Somatic dysfunction of lower extremity  -she lost the HEP - we reprinted these for her and gave her the link to the postural strength video again - I think these will really help her stay strong with caring for children and help her pain -she wanted to try OMT again - see below  PROCEDURE NOTE : OSTEOPATHIC TREATMENT The decision today to treat with gentle Osteopathic Manipulative Therapy  (OMT) was based on physical exam findings. Verbal consent was   obtained after after explanation of risks and benefits. No Cervical HVLA manipulation was performed. After consent was obtained, treatment was  performed as below:      Regions treated: head,cervical thoracic, lumbar, sacral, lower extremity     Techniques used: ME, MR, counterstrain, cranial The patient tolerated the treatment well and reported Improved  symptoms following treatment today. Follow up treatment was advised in: 1-2 weeks   Patient Instructions  BEFORE YOU LEAVE: -follow up: about 1-2 weeks as needed -hamstring exercises  A good intro video is: "Independence from Pain 7-minute Video" - [email or web address]     [name],  Shelly Alia R., DO

## 2017-02-09 ENCOUNTER — Encounter: Payer: Self-pay | Admitting: Podiatry

## 2017-02-09 ENCOUNTER — Ambulatory Visit (INDEPENDENT_AMBULATORY_CARE_PROVIDER_SITE_OTHER): Payer: PPO

## 2017-02-09 ENCOUNTER — Ambulatory Visit (INDEPENDENT_AMBULATORY_CARE_PROVIDER_SITE_OTHER): Payer: PPO | Admitting: Podiatry

## 2017-02-09 DIAGNOSIS — M2042 Other hammer toe(s) (acquired), left foot: Secondary | ICD-10-CM | POA: Diagnosis not present

## 2017-02-09 DIAGNOSIS — M2010 Hallux valgus (acquired), unspecified foot: Secondary | ICD-10-CM

## 2017-02-09 NOTE — Progress Notes (Signed)
Subjective:   Patient ID: [name], female   DOB: 66 y.o.   MRN: [id number]   HPI patient presents stating that her joint is doing well with moderate discomfort.  States she still feels some swelling and pain in the digits if she wears tighter shoes.      ROS      Objective:  Physical Exam  Patient presents post surgical correction of bunion left and digital procedures of the second and third toes with good alignment and good range of motion of the first MPJ with no crepitus of the joint and incision sites that are healing well.     Assessment:  Patient is doing well post osteotomy left with swelling which is still normal for this.  Of the postoperative..     Plan:  At this time I reviewed x-rays with patient and we will allow her to return to normal shoe gear.  Patient will be seen back as needed for the next visit and is encouraged to continue range of motion exercises.  X-rays indicate the osteotomy is healing well with fixation intact and digits in good alignment with no indication of elevation

## 2017-02-27 DIAGNOSIS — N3946 Mixed incontinence: Secondary | ICD-10-CM | POA: Diagnosis not present

## 2017-02-27 DIAGNOSIS — N3020 Other chronic cystitis without hematuria: Secondary | ICD-10-CM | POA: Diagnosis not present

## 2017-02-27 DIAGNOSIS — N3281 Overactive bladder: Secondary | ICD-10-CM | POA: Diagnosis not present

## 2017-03-01 ENCOUNTER — Other Ambulatory Visit: Payer: Self-pay | Admitting: Family Medicine

## 2017-03-23 ENCOUNTER — Encounter: Payer: Self-pay | Admitting: Family Medicine

## 2017-03-23 ENCOUNTER — Other Ambulatory Visit: Payer: Self-pay | Admitting: Internal Medicine

## 2017-03-27 DIAGNOSIS — N3020 Other chronic cystitis without hematuria: Secondary | ICD-10-CM | POA: Diagnosis not present

## 2017-03-27 DIAGNOSIS — R350 Frequency of micturition: Secondary | ICD-10-CM | POA: Diagnosis not present

## 2017-03-27 DIAGNOSIS — N3946 Mixed incontinence: Secondary | ICD-10-CM | POA: Diagnosis not present

## 2017-04-06 ENCOUNTER — Ambulatory Visit (INDEPENDENT_AMBULATORY_CARE_PROVIDER_SITE_OTHER): Payer: PPO

## 2017-04-06 ENCOUNTER — Encounter: Payer: Self-pay | Admitting: Podiatry

## 2017-04-06 ENCOUNTER — Ambulatory Visit (INDEPENDENT_AMBULATORY_CARE_PROVIDER_SITE_OTHER): Payer: PPO | Admitting: Podiatry

## 2017-04-06 DIAGNOSIS — M2042 Other hammer toe(s) (acquired), left foot: Secondary | ICD-10-CM

## 2017-04-06 DIAGNOSIS — M779 Enthesopathy, unspecified: Secondary | ICD-10-CM

## 2017-04-06 NOTE — Progress Notes (Signed)
Subjective:   Patient ID: [name], female   DOB: 67 y.o.   MRN: [id number]   HPI Patient states overall doing well but is still having some swelling and feels a fullness in her forefoot   ROS      Objective:  Physical Exam  Neurovascular status intact with patient's left foot healing well with hallux in rectus position second and third toes with good alignment with good movement and no crepitus of the joint surface     Assessment:  Doing well post osteotomy left first metatarsal digital fusion digits 2 3 left     Plan:  Reviewed x-rays and discussed that this can still be a somewhat problem for her and it can be related to the swelling this going to take approximately a year to go away completely.  Patient at this point had x-rays reviewed will continue with wider shoes elevation compression and will be seen back for [place] to recheck  X-rays indicate the osteotomy is healing well with no indications of movement and we did get the best correction we could given the fact it was distal osteotomy with second and third toes in good alignment

## 2017-04-15 ENCOUNTER — Other Ambulatory Visit: Payer: Self-pay | Admitting: Cardiology

## 2017-04-17 ENCOUNTER — Ambulatory Visit (INDEPENDENT_AMBULATORY_CARE_PROVIDER_SITE_OTHER): Payer: PPO | Admitting: Family Medicine

## 2017-04-17 ENCOUNTER — Telehealth: Payer: Self-pay | Admitting: Family Medicine

## 2017-04-17 ENCOUNTER — Ambulatory Visit (INDEPENDENT_AMBULATORY_CARE_PROVIDER_SITE_OTHER)
Admission: RE | Admit: 2017-04-17 | Discharge: 2017-04-17 | Disposition: A | Discharge status: Home / Self Care | Payer: PPO | Source: Ambulatory Visit | Attending: Family Medicine | Admitting: Family Medicine

## 2017-04-17 ENCOUNTER — Encounter: Payer: Self-pay | Admitting: Family Medicine

## 2017-04-17 VITALS — BP 118/70 | HR 77 | Temp 98.4°F | Ht 69.5 in | Wt 234.8 lb

## 2017-04-17 DIAGNOSIS — J989 Respiratory disorder, unspecified: Secondary | ICD-10-CM

## 2017-04-17 DIAGNOSIS — R05 Cough: Secondary | ICD-10-CM | POA: Diagnosis not present

## 2017-04-17 LAB — POC INFLUENZA A&B (BINAX/QUICKVUE)
INFLUENZA A, POC: NEGATIVE
INFLUENZA B, POC: NEGATIVE

## 2017-04-17 MED ORDER — BENZONATATE 100 MG PO CAPS: 100.0000 mg | ORAL_CAPSULE | Freq: Three times a day (TID) | ORAL | 0 refills | Status: DC | PRN

## 2017-04-17 MED ORDER — PREDNISONE 20 MG PO TABS: 40.0000 mg | ORAL_TABLET | Freq: Every day | ORAL | 0 refills | Status: DC

## 2017-04-17 NOTE — Addendum Note (Signed)
Addended by: Agnes Lawrence on: 04/17/2017 11:12 AM   Modules accepted: Orders

## 2017-04-17 NOTE — Addendum Note (Signed)
Addended by: Lucretia Kern on: 04/17/2017 11:01 AM   Modules accepted: Orders

## 2017-04-17 NOTE — Telephone Encounter (Signed)
Copied from [place]. Topic: Quick Communication - See Telephone Encounter >> Apr 17, 2017  5:04 PM Clack, Laban Emperor wrote: CRM for notification. See Telephone encounter for:  Pt would like a call back for x-ray results.  04/17/17.

## 2017-04-17 NOTE — Progress Notes (Signed)
HPI:  Acute visit for respiratory illness: -started:1 week ago -symptoms:nasal congestion, sore throat, cough, PND, ? Fever initially -denies:persistent fever, SOB, NVD, tooth pain, body aches -has tried: nothing but cough drops -cough is worse at night -feeling a little better today -visiting immunocompromised friend in a few days  -sick contacts/travel/risks: no reported flu, strep or tick exposure - but around children  ROS: See pertinent positives and negatives per HPI.  Past Medical History:  Diagnosis Date  . Arthritis   . Atrial fibrillation ([place])   . Constipation   . Difficult intubation    pt states that her neck needs to be in a neutral position,  scoliosis  . Family history of adverse reaction to anesthesia    nausea  . GERD (gastroesophageal reflux disease)    protonix, hx h. pylori  . History of uterine cancer 04/22/2014   sees Dr. Ronita Hipps; s/p complete hysterectomy  . Hx of colonic polyp   . 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 Melanoma [facility])    sees Dr. Renda Rolls in dermatology  . 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 Peripheral vascular disease ([place])   . PONV (postoperative nausea and vomiting)   . Recurrent UTI   . Recurrent UTI   . S/P hip replacement   . Scoliosis   . Thyroid nodule   . Urinary incontinence   . Venous insufficiency    s/p ablation    Past Surgical History:  Procedure Laterality Date  . ABDOMINAL HYSTERECTOMY  09/10/2013  . BUNIONECTOMY  10/1976  . COLONOSCOPY    . FRACTURE SURGERY  09/1964  . MELANOMA EXCISION  12/2011  . TOTAL HIP ARTHROPLASTY Left 06/17/2014   Procedure: LEFT TOTAL HIP ARTHROPLASTY ANTERIOR APPROACH;  Surgeon: Mcarthur Rossetti, MD;  Location: [place];  Service: Orthopedics;  Laterality: Left;  . TOTAL HIP ARTHROPLASTY Right 09/02/2014   Procedure: RIGHT TOTAL HIP ARTHROPLASTY ANTERIOR APPROACH;  Surgeon:  Mcarthur Rossetti, MD;  Location: [place];  Service: Orthopedics;  Laterality: Right;  . VEIN SURGERY  05/2011   venous ablation     Family History  Problem Relation Age of Onset  . Uterine cancer Mother   . Diabetes Brother 10  . Hypertension Father 85  . Hyperlipidemia Father   . Cancer Father 29  . Epilepsy Father 46  . Arthritis Sister 5  . Cancer Maternal Grandmother   . Stroke Paternal Grandfather   . Arthritis Sister     Social History   Socioeconomic History  . Marital status: Divorced    Spouse name: None  . Number of children: 2  . Years of education: JD  . Highest education level: None  Social Needs  . Financial resource strain: None  . Food insecurity - worry: None  . Food insecurity - inability: None  . Transportation needs - medical: None  . Transportation needs - non-medical: None  Occupational History  . Occupation: Chief Executive Officer  Tobacco Use  . Smoking status: Never Smoker  . Smokeless tobacco: Never Used  Substance and Sexual Activity  . Alcohol use: Yes    Alcohol/week: 0.0 oz    Comment: rarely at [facility]   . Drug use: No  . Sexual activity: None  Other Topics Concern  . None  Social History Narrative   Work or School: retired Tour manager Situation: lives alone - takes care of twin grandchildren 7 months  in 05/2015      Spiritual Beliefs:       Lifestyle: active, healthy diet        Current Outpatient Medications:  .  diltiazem (CARDIZEM) 60 MG tablet, Take 60 mg daily if needed for palpitations, Disp: 30 tablet, Rfl: 6 .  diltiazem (TIAZAC) 240 MG 24 hr capsule, TAKE 1 CAPSULE BY MOUTH EVERY DAY, Disp: 90 capsule, Rfl: 0 .  ELIQUIS 5 MG TABS tablet, TAKE 1 TABLET(5 MG) BY MOUTH TWICE DAILY, Disp: 180 tablet, Rfl: 1 .  flecainide (TAMBOCOR) 100 MG tablet, TAKE 1 TABLET(100 MG) BY MOUTH TWICE DAILY, Disp: 180 tablet, Rfl: 0 .  mirabegron ER (MYRBETRIQ) 50 MG TB24 tablet, Take 25 mg by mouth every other day. , Disp: , Rfl:  .   nitrofurantoin (MACRODANTIN) 100 MG capsule, Take 1 capsule twice a day, Disp: , Rfl: 0 .  pantoprazole (PROTONIX) 40 MG tablet, TAKE 1 TABLET(40 MG) BY MOUTH DAILY, Disp: 90 tablet, Rfl: 0 .  triamcinolone cream (KENALOG) 0.1 %, Apply 1 application topically as needed (for dry skin). , Disp: , Rfl: 1 .  spironolactone (ALDACTONE) 50 MG tablet, Take 1 tablet (50 mg total) by mouth daily. Takes 100mg  every morning and 50mg  in the afternoon, Disp: 90 tablet, Rfl: 5  Current Facility-Administered Medications:  .  0.9 %  sodium chloride infusion, 500 mL, Intravenous, Continuous, Milus Banister, MD  EXAM:  Vitals:   04/17/17 1036  BP: 118/70  Pulse: 77  Temp: 98.4 F (36.9 C)  SpO2: 95%    Body mass index is 34.18 kg/m.  GENERAL: vitals reviewed and listed above, alert, oriented, appears well hydrated and in no acute distress  HEENT: atraumatic, conjunttiva clear, no obvious abnormalities on inspection of external nose and ears, normal appearance of ear canals and TMs, clear nasal congestion, mild post oropharyngeal erythema with PND, no tonsillar edema or exudate, no sinus TTP  NECK: no obvious masses on inspection  LUNGS: clear to auscultation bilaterally, no wheezes, rales or rhonchi, good air movement,  CV: HRRR, no peripheral edema  MS: moves all extremities without noticeable abnormality  PSYCH: pleasant and cooperative, no obvious depression or anxiety  ASSESSMENT AND PLAN:  Discussed the following assessment and plan:  Respiratory illness  -given HPI and exam findings today, a serious infection or illness is unlikely. We discussed potential etiologies, with VURI possible bronchitis or recovering mild influenza being most likely. We discussed treatment side effects, likely course, antibiotic misuse, transmission, and signs of developing a serious illness. -He opted to check a flu test and a chest x-ray given her travels -treat pending results -Opted against Tamiflu  given duration of symptoms, she did opt for Tessalon for cough after discussion of risks, also prednisone for possible bronchitis after discussion of risks. -of course, we advised to return or notify a doctor immediately if symptoms worsen or persist or new concerns arise.    Patient Instructions  BEFORE YOU LEAVE: -flu test -xray sheet  Get the x-ray.  Wash hands frequently and cough deeply into elbow cover your cough.  Can use the Tessalon for cough as needed per instructions.  Can use the prednisone per instructions as well.  INSTRUCTIONS FOR UPPER RESPIRATORY INFECTION:  -plenty of rest and fluids  -nasal saline wash 2-3 times daily (use prepackaged nasal saline or bottled/distilled water if making your own)   -can use AFRIN nasal spray for drainage and nasal congestion - but do NOT use longer then 3-4 days  -  can use tylenol (in no history of liver disease) or ibuprofen (if no history of kidney disease, bowel bleeding or significant heart disease) as directed for aches and sorethroat  -in the winter time, using a humidifier at night is helpful (please follow cleaning instructions)  -if you are taking a cough medication - use only as directed, may also try a teaspoon of honey to coat the throat and throat lozenges. If given a cough medication with codeine or hydrocodone or other narcotic please be advised that this contains a strong and  potentially addicting medication. Please follow instructions carefully, take as little as possible and only use AS NEEDED for severe cough. Discuss potential side effects with your pharmacy. Please do not drive or operate machinery while taking these types of medications. Please do not take other sedating medications, drugs or alcohol while taking this medication without discussing with your doctor.  -for sore throat, salt water gargles can help  -follow up if you have fevers, facial pain, tooth pain, difficulty breathing or are worsening or  symptoms persist longer then expected  Upper Respiratory Infection, Adult An upper respiratory infection (URI) is also known as the common cold. It is often caused by a type of germ (virus). Colds are easily spread (contagious). You can pass it to others by kissing, coughing, sneezing, or drinking out of the same glass. Usually, you get better in 1 to 3  weeks.  However, the cough can last for even longer. HOME CARE   Only take medicine as told by your doctor. Follow instructions provided above.  Drink enough water and fluids to keep your pee (urine) clear or pale yellow.  Get plenty of rest.  Return to work when your temperature is < 100 for 24 hours or as told by your doctor. You may use a face mask and wash your hands to stop your cold from spreading. GET HELP RIGHT AWAY IF:   After the first few days, you feel you are getting worse.  You have questions about your medicine.  You have chills, shortness of breath, or red spit (mucus).  You have pain in the face for more then 1-2 days, especially when you bend forward.  You have a fever, puffy (swollen) neck, pain when you swallow, or white spots in the back of your throat.  You have a bad headache, ear pain, sinus pain, or chest pain.  You have a high-pitched whistling sound when you breathe in and out (wheezing).  You cough up blood.  You have sore muscles or a stiff neck. MAKE SURE YOU:   Understand these instructions.  Will watch your condition.  Will get help right away if you are not doing well or get worse. Document Released: 08/17/2007 Document Revised: 05/23/2011 Document Reviewed: 06/05/2013 [facility] Patient Information 2015 [place], [place]. This information is not intended to replace advice given to you by your health care provider. Make sure you discuss any questions you have with your health care provider.     Lucretia Kern, DO

## 2017-04-17 NOTE — Telephone Encounter (Signed)
Rx(s) sent to pharmacy electronically.  

## 2017-04-17 NOTE — Patient Instructions (Signed)
BEFORE YOU LEAVE: -flu test -xray sheet  Get the x-ray.  Wash hands frequently and cough deeply into elbow cover your cough.  Can use the Tessalon for cough as needed per instructions.  Can use the prednisone per instructions as well.  INSTRUCTIONS FOR UPPER RESPIRATORY INFECTION:  -plenty of rest and fluids  -nasal saline wash 2-3 times daily (use prepackaged nasal saline or bottled/distilled water if making your own)   -can use AFRIN nasal spray for drainage and nasal congestion - but do NOT use longer then 3-4 days  -can use tylenol (in no history of liver disease) or ibuprofen (if no history of kidney disease, bowel bleeding or significant heart disease) as directed for aches and sorethroat  -in the winter time, using a humidifier at night is helpful (please follow cleaning instructions)  -if you are taking a cough medication - use only as directed, may also try a teaspoon of honey to coat the throat and throat lozenges. If given a cough medication with codeine or hydrocodone or other narcotic please be advised that this contains a strong and  potentially addicting medication. Please follow instructions carefully, take as little as possible and only use AS NEEDED for severe cough. Discuss potential side effects with your pharmacy. Please do not drive or operate machinery while taking these types of medications. Please do not take other sedating medications, drugs or alcohol while taking this medication without discussing with your doctor.  -for sore throat, salt water gargles can help  -follow up if you have fevers, facial pain, tooth pain, difficulty breathing or are worsening or symptoms persist longer then expected  Upper Respiratory Infection, Adult An upper respiratory infection (URI) is also known as the common cold. It is often caused by a type of germ (virus). Colds are easily spread (contagious). You can pass it to others by kissing, coughing, sneezing, or drinking out of  the same glass. Usually, you get better in 1 to 3  weeks.  However, the cough can last for even longer. HOME CARE   Only take medicine as told by your doctor. Follow instructions provided above.  Drink enough water and fluids to keep your pee (urine) clear or pale yellow.  Get plenty of rest.  Return to work when your temperature is < 100 for 24 hours or as told by your doctor. You may use a face mask and wash your hands to stop your cold from spreading. GET HELP RIGHT AWAY IF:   After the first few days, you feel you are getting worse.  You have questions about your medicine.  You have chills, shortness of breath, or red spit (mucus).  You have pain in the face for more then 1-2 days, especially when you bend forward.  You have a fever, puffy (swollen) neck, pain when you swallow, or white spots in the back of your throat.  You have a bad headache, ear pain, sinus pain, or chest pain.  You have a high-pitched whistling sound when you breathe in and out (wheezing).  You cough up blood.  You have sore muscles or a stiff neck. MAKE SURE YOU:   Understand these instructions.  Will watch your condition.  Will get help right away if you are not doing well or get worse. Document Released: 08/17/2007 Document Revised: 05/23/2011 Document Reviewed: 06/05/2013 [facility] Patient Information 2015 [place], [place]. This information is not intended to replace advice given to you by your health care provider. Make sure you discuss any questions you  have with your health care provider.

## 2017-04-18 NOTE — Telephone Encounter (Signed)
See results note. 

## 2017-04-27 ENCOUNTER — Ambulatory Visit (INDEPENDENT_AMBULATORY_CARE_PROVIDER_SITE_OTHER): Payer: PPO | Admitting: Internal Medicine

## 2017-04-27 ENCOUNTER — Encounter: Payer: Self-pay | Admitting: Internal Medicine

## 2017-04-27 VITALS — BP 128/84 | HR 63 | Ht 69.0 in | Wt 227.6 lb

## 2017-04-27 DIAGNOSIS — Z79899 Other long term (current) drug therapy: Secondary | ICD-10-CM

## 2017-04-27 DIAGNOSIS — I480 Paroxysmal atrial fibrillation: Secondary | ICD-10-CM

## 2017-04-27 DIAGNOSIS — R0789 Other chest pain: Secondary | ICD-10-CM | POA: Diagnosis not present

## 2017-04-27 MED ORDER — SPIRONOLACTONE 50 MG PO TABS: 50.0000 mg | ORAL_TABLET | Freq: Every day | ORAL | 3 refills | Status: DC

## 2017-04-27 NOTE — Patient Instructions (Signed)
Your physician wants you to follow-up in: ONE YEAR with Dr. Hilty. You will receive a reminder letter in the mail two months in advance. If you don't receive a letter, please call our office to schedule the follow-up appointment.  

## 2017-04-27 NOTE — Progress Notes (Signed)
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 She is from [place] and was living in the [place] area. Her past medical history is significant for atrial fibrillation. This was first noted in 2012 and she was controlled initially with diltiazem. She was in a hospital in atrial fibrillation and spontaneously converted back to sinus. Subsequently she had some breakthroughs in her dose of diltiazem was increased. Eventually she saw different cardiologist and was placed on flecainide 50 mg twice a day has had only one breakthrough that she is aware of since starting on this medication. This was associated with a UTI which she was given Cipro, which I cautioned her about using in the future due to QT interactions. She also has a history of uterine cancer fortunately and is a survivor. She continues to have problems with bilateral hip osteoarthritis and is scheduled to have left hip replacement in the near future. She denies any chest pain or shortness of breath with exertion. She reports her prior cardiologist in [place] did stress testing within the last 2 years and has done several stress tests and she started with atrial fibrillation. There is no family history of coronary disease although cancer is somewhat rapid in the family.  Mr. Inglett is doing fairly well today. She denies any chest pain or shortness of breath. She reports no recurrence of A. fib that she is aware of. She is maintaining normal sinus rhythm and is on flecainide 50 mg twice a day. QTc interval is 432 ms. She is also taking aspirin 650 mg daily.  I had the pleasure seen [name] back in the office today. She is doing exceedingly well. She had recent hip replacement and is ambulating much better. She reports her pain is improved and she is now off of narcotics. She's had no further problems  with palpitations after increasing her flecainide to 75 mg twice a day. Her EKG remained stable and heart rate is 80 today with a QTC of 454 ms. She tells me that her granddaughter recently had twins that were premature but are doing well and she is enjoying being grandmother.  11/02/2015  [name] returns today for follow-up. Overall she's been doing well. She denies any complaints such as palpitations or fluttering. She reports fairly good swelling on spironolactone which is her current diuretic. Although we typically would use a loop diuretic or thiazide, this is reasonable treatment and she's not had a problem with her kidney function or potassium. QTC today is 460 ms on flecainide with normal sinus rhythm. She is on full dose aspirin as her previous CHADSVASC score was 1, however since she is female and greater than age 4 her CHADSVASC score is now 2. We had a long discussion about her stroke risk, particularly the increased stroke risk for females in this age group. Her stroke risk is about 2.2%. If she were to take aspirin the stroke was actually slightly higher at 2.3% but maybe not statistically different. The bleeding risk is 1.1%. If she were to take Eliquis, her stroke risk is reduced to 0.8% with a bleeding risk of 2.6%.  05/03/2016  [name] returns for day for follow-up. She is doing well without recurrent atrial fibrillation on flecainide. She is on L O'Quinn is without any significant bleeding problems. Weight has been stable and blood  pressure is at goal.   04/27/2017  [name] is seen today for annual follow-up.  She had some recurrent A. fib in May 2018 and was seen at the A. fib clinic.  Her flecainide was increased to 100mg  twice daily and she has had resolution of her A. fib.  She seems to be pleased with the control she has at this point.  EKG was performed today shows sinus rhythm first-degree AV block at a rate of 63 with a QTC of 429 ms.  Blood pressure is well controlled.   She denies any chest pain or worsening shortness of breath.  PMHx:  Past Medical History:  Diagnosis Date  . Arthritis   . Atrial fibrillation ([place])   . Constipation   . Difficult intubation    pt states that her neck needs to be in a neutral position,  scoliosis  . Family history of adverse reaction to anesthesia    nausea  . GERD (gastroesophageal reflux disease)    protonix, hx h. pylori  . History of uterine cancer 04/22/2014   sees Dr. Ronita Hipps; s/p complete hysterectomy  . Hx of colonic polyp   . 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 Melanoma [facility])    sees Dr. Renda Rolls in dermatology  . 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 Peripheral vascular disease ([place])   . PONV (postoperative nausea and vomiting)   . Recurrent UTI   . Recurrent UTI   . S/P hip replacement   . Scoliosis   . Thyroid nodule   . Urinary incontinence   . Venous insufficiency    s/p ablation    Past Surgical History:  Procedure Laterality Date  . ABDOMINAL HYSTERECTOMY  09/10/2013  . BUNIONECTOMY  10/1976  . COLONOSCOPY    . FRACTURE SURGERY  09/1964  . MELANOMA EXCISION  12/2011  . TOTAL HIP ARTHROPLASTY Left 06/17/2014   Procedure: LEFT TOTAL HIP ARTHROPLASTY ANTERIOR APPROACH;  Surgeon: Mcarthur Rossetti, MD;  Location: [place];  Service: Orthopedics;  Laterality: Left;  . TOTAL HIP ARTHROPLASTY Right 09/02/2014   Procedure: RIGHT TOTAL HIP ARTHROPLASTY ANTERIOR APPROACH;  Surgeon: Mcarthur Rossetti, MD;  Location: [place];  Service: Orthopedics;  Laterality: Right;  . VEIN SURGERY  05/2011   venous ablation     FAMHx:  Family History  Problem Relation Age of Onset  . Uterine cancer Mother   . Diabetes Brother 23  . Hypertension Father 70  . Hyperlipidemia Father   . Cancer Father 42  . Epilepsy Father 10  . Arthritis Sister 75  . Cancer Maternal Grandmother   . Stroke Paternal Grandfather     . Arthritis Sister     SOCHx:   reports that  has never smoked. she has never used smokeless tobacco. She reports that she drinks alcohol. She reports that she does not use drugs.  ALLERGIES:  Allergies  Allergen Reactions  . Hydrolyzed Silk Hives and Other (See Comments)    Silk tape-blisters  . Ciprofloxacin     Reactions with antiarrythmic  . Gluten Meal Other (See Comments)    reflux    ROS: Pertinent items noted in HPI and remainder of comprehensive ROS otherwise negative.  HOME MEDS: Current Outpatient Medications  Medication Sig Dispense Refill  . diltiazem (CARDIZEM) 60 MG tablet Take 60 mg daily if needed for palpitations 30 tablet 6  . diltiazem (TIAZAC) 240 MG 24 hr capsule TAKE  1 CAPSULE BY MOUTH EVERY DAY 90 capsule 0  . ELIQUIS 5 MG TABS tablet TAKE 1 TABLET(5 MG) BY MOUTH TWICE DAILY 180 tablet 1  . flecainide (TAMBOCOR) 100 MG tablet TAKE 1 TABLET(100 MG) BY MOUTH TWICE DAILY 180 tablet 0  . mirabegron ER (MYRBETRIQ) 50 MG TB24 tablet Take 25 mg by mouth every other day.     . nitrofurantoin (MACRODANTIN) 100 MG capsule Take 1 capsule twice a day  0  . pantoprazole (PROTONIX) 40 MG tablet TAKE 1 TABLET(40 MG) BY MOUTH DAILY 90 tablet 0  . spironolactone (ALDACTONE) 50 MG tablet Take 50 mg by mouth as directed.    . triamcinolone cream (KENALOG) 0.1 % Apply 1 application topically as needed (for dry skin).   1   Current Facility-Administered Medications  Medication Dose Route Frequency Provider Last Rate Last Dose  . 0.9 %  sodium chloride infusion  500 mL Intravenous Continuous Milus Banister, MD        LABS/IMAGING: No results found for this or any previous visit (from the past 48 hour(s)). No results found.  VITALS: BP 128/84   Pulse 63   Ht 5\' 9"  (1.753 m)   Wt 227 lb 9.6 oz (103.2 kg)   BMI 33.61 kg/m   EXAM: General appearance: alert and no distress Lungs: clear to auscultation bilaterally Heart: regular rate and rhythm, S1, S2 normal, no  murmur, click, rub or gallop Pulses: 2+ and symmetric Neurologic: Grossly normal  EKG: Sinus rhythm with first-degree AV block at 63, possible left atrial enlargement, QTC 429 ms-personally reviewed  ASSESSMENT: 1. History of paroxysmal atrial fibrillation - CHADSVASC of 2 (female, >65) 2. Non-ischemic myoview and normal echo findings - EF 66% (08/2016) 3. Long term antiarrhythmic therapy on flecainide 4. Bilateral hip osteoarthritis - status post bilateral TKR's 5. Bilateral venous insufficiency  PLAN: 1.   [name] had breakthrough A. fib in May of last year however is tolerating increased dose of flecainide.  She did have some progressive chest and jaw pressure.  He had a stress test and echocardiogram in June 2018.  The stress test was negative for ischemia and showed some mild breast attenuation artifact with an EF 66%.  Echo showed normal LV cavity size and wall thickness with normal LV function.  Continue current medications. Follow-up annually or sooner as necessary.  Pixie Casino, MD, [facility], [place] Director of the Advanced Lipid Disorders &  Cardiovascular Risk Reduction Clinic Diplomate of the American Board of Clinical Lipidology Attending Cardiologist  Direct Dial: [phone]  Fax: [phone]  Website:  [email or web address]  [name] 04/27/2017, 8:29 AM

## 2017-05-04 ENCOUNTER — Encounter: Payer: Self-pay | Admitting: *Deleted

## 2017-05-04 NOTE — Progress Notes (Signed)
MyChart message sent instructing patient to schedule labs.

## 2017-05-12 ENCOUNTER — Other Ambulatory Visit: Payer: Self-pay | Admitting: Internal Medicine

## 2017-05-12 DIAGNOSIS — I4891 Unspecified atrial fibrillation: Secondary | ICD-10-CM

## 2017-05-18 ENCOUNTER — Other Ambulatory Visit: Payer: Self-pay | Admitting: Internal Medicine

## 2017-05-18 NOTE — Telephone Encounter (Signed)
REFILL 

## 2017-05-25 DIAGNOSIS — N3946 Mixed incontinence: Secondary | ICD-10-CM | POA: Diagnosis not present

## 2017-05-25 DIAGNOSIS — N3281 Overactive bladder: Secondary | ICD-10-CM | POA: Diagnosis not present

## 2017-05-25 DIAGNOSIS — N3020 Other chronic cystitis without hematuria: Secondary | ICD-10-CM | POA: Diagnosis not present

## 2017-05-28 ENCOUNTER — Other Ambulatory Visit: Payer: Self-pay | Admitting: Family Medicine

## 2017-06-01 ENCOUNTER — Ambulatory Visit: Payer: PPO | Admitting: Family Medicine

## 2017-06-12 ENCOUNTER — Other Ambulatory Visit: Payer: Self-pay | Admitting: Surgery

## 2017-06-12 DIAGNOSIS — E042 Nontoxic multinodular goiter: Secondary | ICD-10-CM

## 2017-06-13 ENCOUNTER — Ambulatory Visit (INDEPENDENT_AMBULATORY_CARE_PROVIDER_SITE_OTHER): Payer: PPO | Admitting: Family Medicine

## 2017-06-13 ENCOUNTER — Encounter: Payer: Self-pay | Admitting: Family Medicine

## 2017-06-13 VITALS — BP 102/68 | HR 76 | Temp 98.1°F | Ht 69.0 in

## 2017-06-13 DIAGNOSIS — M9900 Segmental and somatic dysfunction of head region: Secondary | ICD-10-CM

## 2017-06-13 DIAGNOSIS — M79672 Pain in left foot: Secondary | ICD-10-CM | POA: Diagnosis not present

## 2017-06-13 DIAGNOSIS — M79671 Pain in right foot: Secondary | ICD-10-CM | POA: Diagnosis not present

## 2017-06-13 DIAGNOSIS — M9906 Segmental and somatic dysfunction of lower extremity: Secondary | ICD-10-CM

## 2017-06-13 DIAGNOSIS — M25511 Pain in right shoulder: Secondary | ICD-10-CM | POA: Diagnosis not present

## 2017-06-13 DIAGNOSIS — M9902 Segmental and somatic dysfunction of thoracic region: Secondary | ICD-10-CM

## 2017-06-13 DIAGNOSIS — G8929 Other chronic pain: Secondary | ICD-10-CM

## 2017-06-13 DIAGNOSIS — M9903 Segmental and somatic dysfunction of lumbar region: Secondary | ICD-10-CM

## 2017-06-13 DIAGNOSIS — M545 Low back pain: Secondary | ICD-10-CM | POA: Diagnosis not present

## 2017-06-13 NOTE — Progress Notes (Signed)
HPI:  Using dictation device. Unfortunately this device frequently misinterprets words/phrases.  [name] is a very pleasant 67 year old here for osteopathic treatments for back pain.  Today she is having some pain in her right low, her right shoulder and in her bilateral feet.  She has had bunion surgery on her feet.  She has tried to work with various footwear to help her foot pain.  She has found osteopathic treatments helpful in the past.  Has not been seen in some time for this.  She is considering following back up with Dr. Letta Pate if has persistent issues with the back pain as nerve ablation helped in the past.   Prior history:  Neck pain: -for many years  -reports saw [facility] in [place] whom advised urgent surgery for DD, then saw Dr. Beverely Pace at [facility] and was told did not need surgery  -saw Dr. Rolena Infante in [place] 12/2016 and reports told to do PT  -PT and osteopathic treatments helped -no radiation to the extremities, weakness or numbness in the upper extremities, and ability to turn the head and neck, dizziness  Right trapezius muscle mid fiber pain: -Started in 2017 -No radiation to the arm, weakness or numbness in the arms  Low back pain: -Chronic, mainly in the left lower back muscles -She has had MRIs the back and reports she has mild scoliosis -She saw Dr. Dianna Limbo in the past for this and had some injections and nerve ablation -In the past she had mild radiation into the extremity -No weakness, numbness, bowel or bladder dysfunction from this -hx hip replacement for OA   ROS: See pertinent positives and negatives per HPI.  Past Medical History:  Diagnosis Date  . Arthritis   . Atrial fibrillation ([place])   . Constipation   . Difficult intubation    pt states that her neck needs to be in a neutral position,  scoliosis  . Family history of adverse reaction to anesthesia    nausea  . GERD (gastroesophageal reflux disease)    protonix, hx h. pylori  .  History of uterine cancer 04/22/2014   sees Dr. Ronita Hipps; s/p complete hysterectomy  . Hx of colonic polyp   . 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 Melanoma [facility])    sees Dr. Renda Rolls in dermatology  . 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 Peripheral vascular disease ([place])   . PONV (postoperative nausea and vomiting)   . Recurrent UTI   . Recurrent UTI   . S/P hip replacement   . Scoliosis   . Thyroid nodule   . Urinary incontinence   . Venous insufficiency    s/p ablation    Past Surgical History:  Procedure Laterality Date  . ABDOMINAL HYSTERECTOMY  09/10/2013  . BUNIONECTOMY  10/1976  . COLONOSCOPY    . FRACTURE SURGERY  09/1964  . MELANOMA EXCISION  12/2011  . TOTAL HIP ARTHROPLASTY Left 06/17/2014   Procedure: LEFT TOTAL HIP ARTHROPLASTY ANTERIOR APPROACH;  Surgeon: Mcarthur Rossetti, MD;  Location: [place];  Service: Orthopedics;  Laterality: Left;  . TOTAL HIP ARTHROPLASTY Right 09/02/2014   Procedure: RIGHT TOTAL HIP ARTHROPLASTY ANTERIOR APPROACH;  Surgeon: Mcarthur Rossetti, MD;  Location: [place];  Service: Orthopedics;  Laterality: Right;  . VEIN SURGERY  05/2011   venous ablation     Family History  Problem Relation Age of Onset  . Uterine cancer Mother   .  Diabetes Brother 46  . Hypertension Father 29  . Hyperlipidemia Father   . Cancer Father 27  . Epilepsy Father 40  . Arthritis Sister 20  . Cancer Maternal Grandmother   . Stroke Paternal Grandfather   . Arthritis Sister     SOCIAL HX: Takes care of grandchildren   Current Outpatient Medications:  .  apixaban (ELIQUIS) 5 MG TABS tablet, Take 1 tablet (5 mg total) by mouth 2 (two) times daily. BLOOD WORK NEEDED PRIOR TO NEXT REFILL AUTHORIZATION., Disp: 180 tablet, Rfl: 0 .  diltiazem (CARDIZEM) 60 MG tablet, Take 60 mg daily if needed for palpitations, Disp: 30 tablet, Rfl: 6 .  diltiazem (TIAZAC) 240 MG  24 hr capsule, TAKE 1 CAPSULE BY MOUTH EVERY DAY, Disp: 90 capsule, Rfl: 0 .  flecainide (TAMBOCOR) 100 MG tablet, TAKE 1 TABLET(100 MG) BY MOUTH TWICE DAILY, Disp: 180 tablet, Rfl: 0 .  mirabegron ER (MYRBETRIQ) 50 MG TB24 tablet, Take 25 mg by mouth every other day. , Disp: , Rfl:  .  nitrofurantoin (MACRODANTIN) 100 MG capsule, Take 1 capsule twice a day, Disp: , Rfl: 0 .  pantoprazole (PROTONIX) 40 MG tablet, TAKE 1 TABLET(40 MG) BY MOUTH DAILY, Disp: 90 tablet, Rfl: 0 .  spironolactone (ALDACTONE) 50 MG tablet, TAKE 2 TABLETS BY MOUTH EVERY MORNING, THEN TAKE 1 TABLET EVERY EVENING, Disp: 270 tablet, Rfl: 3 .  triamcinolone cream (KENALOG) 0.1 %, Apply 1 application topically as needed (for dry skin). , Disp: , Rfl: 1  Current Facility-Administered Medications:  .  0.9 %  sodium chloride infusion, 500 mL, Intravenous, Continuous, Milus Banister, MD  EXAM:  Vitals:   06/13/17 1633  BP: 102/68  Pulse: 76  Temp: 98.1 F (36.7 C)    Body mass index is 33.61 kg/m.  GENERAL: vitals reviewed and listed above, alert, oriented, appears well hydrated and in no acute distress  HEENT: atraumatic, conjunttiva clear, no obvious abnormalities on inspection of external nose and ears  NECK: no obvious masses on inspection  MS: moves all extremities without noticeable abnormality, normal gait, on inspection of her bilateral lower extremities and feet she has significant valgus deformities of both ankles right greater than left, pes planus deformity bilaterally, bunions -greater on the right, tib-fib rotated right, she has a right posterior L4 tender point, T 5 through 9 rotated right side bent left, thoracic inlet rotated right, right side bending rotation SBS strain  PSYCH: pleasant and cooperative, no obvious depression or anxiety  ASSESSMENT AND PLAN:  Discussed the following assessment and plan:  Chronic right-sided low back pain without sciatica  Chronic right shoulder pain  Pain  in both feet  Somatic dysfunction of head region  Somatic dysfunction of thoracic region  Somatic dysfunction of lumbar region  Somatic dysfunction of lower extremity  -She opted for osteopathic treatments, see below -Consideration of seeing Dr. Letta Pate again a persistent issues not resolving with OMT  PROCEDURE NOTE : OSTEOPATHIC TREATMENT The decision today to treat with gentle Osteopathic Manipulative Therapy  (OMT) was based on physical exam findings, diagnoses and patient wishes. Verbal consent was obtained after after explanation of risks and benefits. No Cervical HVLA manipulation was performed. After consent was obtained, treatment was  performed as below:      Regions treated: Head, thoracic, lumbar, lower extremity     Techniques used: Counterstrain, muscle energy, cranial, myofascial release The patient tolerated the treatment well and reported Improved  symptoms following treatment today. Follow up treatment was  advised in: 1-2 weeks   There are no Patient Instructions on file for this visit.  Lucretia Kern, DO

## 2017-06-20 ENCOUNTER — Other Ambulatory Visit: Payer: Self-pay | Admitting: Internal Medicine

## 2017-06-20 NOTE — Telephone Encounter (Signed)
REFILL 

## 2017-06-22 ENCOUNTER — Ambulatory Visit
Admission: RE | Admit: 2017-06-22 | Discharge: 2017-06-22 | Disposition: A | Discharge status: Home / Self Care | Payer: PPO | Source: Ambulatory Visit | Attending: Surgery | Admitting: Surgery

## 2017-06-22 DIAGNOSIS — E041 Nontoxic single thyroid nodule: Secondary | ICD-10-CM | POA: Diagnosis not present

## 2017-06-22 DIAGNOSIS — E042 Nontoxic multinodular goiter: Secondary | ICD-10-CM

## 2017-07-06 DIAGNOSIS — N3020 Other chronic cystitis without hematuria: Secondary | ICD-10-CM | POA: Diagnosis not present

## 2017-07-06 DIAGNOSIS — N3946 Mixed incontinence: Secondary | ICD-10-CM | POA: Diagnosis not present

## 2017-07-06 DIAGNOSIS — N3281 Overactive bladder: Secondary | ICD-10-CM | POA: Diagnosis not present

## 2017-07-17 ENCOUNTER — Other Ambulatory Visit: Payer: Self-pay | Admitting: Internal Medicine

## 2017-07-17 NOTE — Telephone Encounter (Signed)
REFILL 

## 2017-08-03 DIAGNOSIS — E042 Nontoxic multinodular goiter: Secondary | ICD-10-CM | POA: Diagnosis not present

## 2017-08-15 DIAGNOSIS — N3946 Mixed incontinence: Secondary | ICD-10-CM | POA: Diagnosis not present

## 2017-08-15 DIAGNOSIS — N3020 Other chronic cystitis without hematuria: Secondary | ICD-10-CM | POA: Diagnosis not present

## 2017-08-15 DIAGNOSIS — N3281 Overactive bladder: Secondary | ICD-10-CM | POA: Diagnosis not present

## 2017-08-16 NOTE — Progress Notes (Signed)
HPI:  Using dictation device. Unfortunately this device frequently misinterprets words/phrases.  Her for OMM for chronic low back pain. Has responded well to inj with PMR in the past and to OMM. Today reports flare started about 2 weeks ago and pain was severe, 7/10 at first. Worse with movements, better with flexeril her daughter had. Now improving some with pain in L low back. . Denies fevers, malaise, radiation, weakness, numbness, B/b incont.  ROS: See pertinent positives and negatives per HPI.  Past Medical History:  Diagnosis Date  . Arthritis   . Atrial fibrillation ([place])   . Constipation   . Difficult intubation    pt states that her neck needs to be in a neutral position,  scoliosis  . Family history of adverse reaction to anesthesia    nausea  . GERD (gastroesophageal reflux disease)    protonix, hx h. pylori  . History of uterine cancer 04/22/2014   sees Dr. Ronita Hipps; s/p complete hysterectomy  . Hx of colonic polyp   . 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 Melanoma [facility])    sees Dr. Renda Rolls in dermatology  . 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 Peripheral vascular disease ([place])   . PONV (postoperative nausea and vomiting)   . Recurrent UTI   . Recurrent UTI   . S/P hip replacement   . Scoliosis   . Thyroid nodule   . Urinary incontinence   . Venous insufficiency    s/p ablation    Past Surgical History:  Procedure Laterality Date  . ABDOMINAL HYSTERECTOMY  09/10/2013  . BUNIONECTOMY  10/1976  . COLONOSCOPY    . FRACTURE SURGERY  09/1964  . MELANOMA EXCISION  12/2011  . TOTAL HIP ARTHROPLASTY Left 06/17/2014   Procedure: LEFT TOTAL HIP ARTHROPLASTY ANTERIOR APPROACH;  Surgeon: Mcarthur Rossetti, MD;  Location: [place];  Service: Orthopedics;  Laterality: Left;  . TOTAL HIP ARTHROPLASTY Right 09/02/2014   Procedure: RIGHT TOTAL HIP ARTHROPLASTY ANTERIOR APPROACH;   Surgeon: Mcarthur Rossetti, MD;  Location: [place];  Service: Orthopedics;  Laterality: Right;  . VEIN SURGERY  05/2011   venous ablation     Family History  Problem Relation Age of Onset  . Uterine cancer Mother   . Diabetes Brother 58  . Hypertension Father 63  . Hyperlipidemia Father   . Cancer Father 37  . Epilepsy Father 45  . Arthritis Sister 10  . Cancer Maternal Grandmother   . Stroke Paternal Grandfather   . Arthritis Sister     SOCIAL HX: see hpi   Current Outpatient Medications:  .  apixaban (ELIQUIS) 5 MG TABS tablet, Take 1 tablet (5 mg total) by mouth 2 (two) times daily. BLOOD WORK NEEDED PRIOR TO NEXT REFILL AUTHORIZATION., Disp: 180 tablet, Rfl: 0 .  diltiazem (CARDIZEM) 60 MG tablet, Take 60 mg daily if needed for palpitations, Disp: 30 tablet, Rfl: 6 .  diltiazem (TIAZAC) 240 MG 24 hr capsule, TAKE 1 CAPSULE BY MOUTH EVERY DAY, Disp: 90 capsule, Rfl: 3 .  flecainide (TAMBOCOR) 100 MG tablet, TAKE 1 TABLET(100 MG) BY MOUTH TWICE DAILY, Disp: 180 tablet, Rfl: 3 .  mirabegron ER (MYRBETRIQ) 50 MG TB24 tablet, Take 25 mg by mouth every other day. , Disp: , Rfl:  .  nitrofurantoin (MACRODANTIN) 100 MG capsule, Take 1 capsule twice a day, Disp: , Rfl: 0 .  pantoprazole (PROTONIX) 40 MG tablet, TAKE 1 TABLET(40  MG) BY MOUTH DAILY, Disp: 90 tablet, Rfl: 0 .  spironolactone (ALDACTONE) 50 MG tablet, TAKE 2 TABLETS BY MOUTH EVERY MORNING, THEN TAKE 1 TABLET EVERY EVENING, Disp: 270 tablet, Rfl: 3 .  triamcinolone cream (KENALOG) 0.1 %, Apply 1 application topically as needed (for dry skin). , Disp: , Rfl: 1 .  cyclobenzaprine (FLEXERIL) 5 MG tablet, Take 1 tablet (5 mg total) by mouth 3 (three) times daily as needed for muscle spasms., Disp: 20 tablet, Rfl: 0  Current Facility-Administered Medications:  .  0.9 %  sodium chloride infusion, 500 mL, Intravenous, Continuous, Milus Banister, MD  EXAM:  Vitals:   08/17/17 1142  BP: 118/78  Pulse: 78  Temp: 98.3 F  (36.8 C)    Body mass index is 35.07 kg/m.  GENERAL: vitals reviewed and listed above, alert, oriented, appears well hydrated and in no acute distress  HEENT: atraumatic, conjunttiva clear, no obvious abnormalities on inspection of external nose and ears  NECK: no obvious masses on inspection  MS: moves all extremities without noticeable abnormality, thoracolumbar scoliosis, gait normal, normal function LEs bilat, T 7-8 RrSl, L4-5 Rl, Sr, L 3 post TP, R/R sacral torsion, Pelvis Rr, TO Rr, C4 F RrSl, sob occ muscle tension, L SBS sidebending rotation  PSYCH: pleasant and cooperative, no obvious depression or anxiety  ASSESSMENT AND PLAN:  Discussed the following assessment and plan:  Chronic left-sided low back pain without sciatica  Somatic dysfunction of lumbar region  Somatic dysfunction of sacral region  Somatic dysfunction of thoracic region  Somatic dysfunction of cervical region  Somatic dysfunction of head region  Somatic dysfunction of pelvic region  -discussed various options for further evaluation and management -she opted for conservative tx with hep, muscle relaxer - rx sent after discussion risks, OMM - see below, consideration PT or re-eval with PMR (declined for now)  PROCEDURE NOTE : OSTEOPATHIC TREATMENT The decision today to treat with gentle Osteopathic Manipulative Therapy  (OMT) was based on physical exam findings, diagnoses and patient wishes. Verbal consent was obtained after after explanation of risks and benefits. No Cervical HVLA manipulation was performed. After consent was obtained, treatment was  performed as below:      Regions treated:  Head, neck, thoracic, lumbar, pelvis, sacrum     Techniques used: muscle energy, counterstrain, MR, cranial, BMT The patient tolerated the treatment well and reported Improved  symptoms following treatment today. Follow up treatment was advised in: 1-2 weeks    Patient Instructions  BEFORE  YOU LEAVE: -follow up: OMT 1-2 weeks  Do the home exercises 4 days per week  Heat, topical menthol (tiger balm) flexeril as needed   Shelly Kern, DO

## 2017-08-17 ENCOUNTER — Ambulatory Visit (INDEPENDENT_AMBULATORY_CARE_PROVIDER_SITE_OTHER): Payer: PPO | Admitting: Family Medicine

## 2017-08-17 ENCOUNTER — Encounter: Payer: Self-pay | Admitting: Family Medicine

## 2017-08-17 VITALS — BP 118/78 | HR 78 | Temp 98.3°F | Ht 69.0 in | Wt 237.5 lb

## 2017-08-17 DIAGNOSIS — M9904 Segmental and somatic dysfunction of sacral region: Secondary | ICD-10-CM

## 2017-08-17 DIAGNOSIS — M545 Low back pain, unspecified: Secondary | ICD-10-CM

## 2017-08-17 DIAGNOSIS — G8929 Other chronic pain: Secondary | ICD-10-CM

## 2017-08-17 DIAGNOSIS — M9901 Segmental and somatic dysfunction of cervical region: Secondary | ICD-10-CM | POA: Diagnosis not present

## 2017-08-17 DIAGNOSIS — M9902 Segmental and somatic dysfunction of thoracic region: Secondary | ICD-10-CM

## 2017-08-17 DIAGNOSIS — M9900 Segmental and somatic dysfunction of head region: Secondary | ICD-10-CM | POA: Diagnosis not present

## 2017-08-17 DIAGNOSIS — M9905 Segmental and somatic dysfunction of pelvic region: Secondary | ICD-10-CM

## 2017-08-17 DIAGNOSIS — M9903 Segmental and somatic dysfunction of lumbar region: Secondary | ICD-10-CM | POA: Diagnosis not present

## 2017-08-17 MED ORDER — CYCLOBENZAPRINE HCL 5 MG PO TABS: 5.0000 mg | ORAL_TABLET | Freq: Three times a day (TID) | ORAL | 0 refills | Status: DC | PRN

## 2017-08-17 NOTE — Patient Instructions (Addendum)
BEFORE YOU LEAVE: -follow up: OMT 1-2 weeks  Do the home exercises 4 days per week  Heat, topical menthol (tiger balm) flexeril as needed

## 2017-08-23 NOTE — Progress Notes (Signed)
HPI:  Using dictation device. Unfortunately this device frequently misinterprets words/phrases.  Visit for chronic low back pain: -has responded well to OMM and inj w/ PMR in the past -flare in L low back pain the last several weeks, treated 6/6 -today reports: doing well, reports more active, has been able to start exercising - doing exercise bike 20 minutes 3 days per week , started taking hemp oil -mild low back pain -denies: worsening, radiation, weakness, numbness  ROS: See pertinent positives and negatives per HPI.  Past Medical History:  Diagnosis Date  . Arthritis   . Atrial fibrillation ([place])   . Constipation   . Difficult intubation    pt states that her neck needs to be in a neutral position,  scoliosis  . Family history of adverse reaction to anesthesia    nausea  . GERD (gastroesophageal reflux disease)    protonix, hx h. pylori  . History of uterine cancer 04/22/2014   sees Dr. Ronita Hipps; s/p complete hysterectomy  . Hx of colonic polyp   . 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 Melanoma [facility])    sees Dr. Renda Rolls in dermatology  . 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 Peripheral vascular disease ([place])   . PONV (postoperative nausea and vomiting)   . Recurrent UTI   . Recurrent UTI   . S/P hip replacement   . Scoliosis   . Thyroid nodule   . Urinary incontinence   . Venous insufficiency    s/p ablation    Past Surgical History:  Procedure Laterality Date  . ABDOMINAL HYSTERECTOMY  09/10/2013  . BUNIONECTOMY  10/1976  . COLONOSCOPY    . FRACTURE SURGERY  09/1964  . MELANOMA EXCISION  12/2011  . TOTAL HIP ARTHROPLASTY Left 06/17/2014   Procedure: LEFT TOTAL HIP ARTHROPLASTY ANTERIOR APPROACH;  Surgeon: Mcarthur Rossetti, MD;  Location: [place];  Service: Orthopedics;  Laterality: Left;  . TOTAL HIP ARTHROPLASTY Right 09/02/2014   Procedure: RIGHT TOTAL HIP  ARTHROPLASTY ANTERIOR APPROACH;  Surgeon: Mcarthur Rossetti, MD;  Location: [place];  Service: Orthopedics;  Laterality: Right;  . VEIN SURGERY  05/2011   venous ablation     Family History  Problem Relation Age of Onset  . Uterine cancer Mother   . Diabetes Brother 70  . Hypertension Father 72  . Hyperlipidemia Father   . Cancer Father 44  . Epilepsy Father 65  . Arthritis Sister 43  . Cancer Maternal Grandmother   . Stroke Paternal Grandfather   . Arthritis Sister     SOCIAL HX: see hpi   Current Outpatient Medications:  .  apixaban (ELIQUIS) 5 MG TABS tablet, Take 1 tablet (5 mg total) by mouth 2 (two) times daily. BLOOD WORK NEEDED PRIOR TO NEXT REFILL AUTHORIZATION., Disp: 180 tablet, Rfl: 0 .  cyclobenzaprine (FLEXERIL) 5 MG tablet, Take 1 tablet (5 mg total) by mouth 3 (three) times daily as needed for muscle spasms., Disp: 20 tablet, Rfl: 0 .  diltiazem (CARDIZEM) 60 MG tablet, Take 60 mg daily if needed for palpitations, Disp: 30 tablet, Rfl: 6 .  diltiazem (TIAZAC) 240 MG 24 hr capsule, TAKE 1 CAPSULE BY MOUTH EVERY DAY, Disp: 90 capsule, Rfl: 3 .  flecainide (TAMBOCOR) 100 MG tablet, TAKE 1 TABLET(100 MG) BY MOUTH TWICE DAILY, Disp: 180 tablet, Rfl: 3 .  mirabegron ER (MYRBETRIQ) 50 MG TB24 tablet, Take 25 mg by mouth every other  day. , Disp: , Rfl:  .  nitrofurantoin (MACRODANTIN) 100 MG capsule, Take 1 capsule twice a day, Disp: , Rfl: 0 .  pantoprazole (PROTONIX) 40 MG tablet, TAKE 1 TABLET(40 MG) BY MOUTH DAILY, Disp: 90 tablet, Rfl: 0 .  spironolactone (ALDACTONE) 50 MG tablet, TAKE 2 TABLETS BY MOUTH EVERY MORNING, THEN TAKE 1 TABLET EVERY EVENING, Disp: 270 tablet, Rfl: 3 .  triamcinolone cream (KENALOG) 0.1 %, Apply 1 application topically as needed (for dry skin). , Disp: , Rfl: 1  Current Facility-Administered Medications:  .  0.9 %  sodium chloride infusion, 500 mL, Intravenous, Continuous, Milus Banister, MD  EXAM:  Vitals:   08/24/17 1247  BP:  100/60  Pulse: 87  Temp: 98.3 F (36.8 C)    Body mass index is 34.81 kg/m.  GENERAL: vitals reviewed and listed above, alert, oriented, appears well hydrated and in no acute distress  HEENT: atraumatic, conjunttiva clear, no obvious abnormalities on inspection of external nose and ears  NECK: no obvious masses on inspection  MS: moves all extremities without noticeable abnormality, normal gait, T6-9 RlSl, L4-5 RlSl, OA Rl. TI Rr, TO Rr, pelvis R r, SBS RSR  PSYCH: pleasant and cooperative, no obvious depression or anxiety  ASSESSMENT AND PLAN:  Discussed the following assessment and plan:  Chronic low back pain without sciatica, unspecified back pain laterality  -OMM per belwo -continue exercise and gradually increase to 5 days per week -discussed quality/safety concerns with supplement - will try to look on consumer labs to see if they have reviewed the product she is using  PROCEDURE NOTE : OSTEOPATHIC TREATMENT The decision today to treat with gentle Osteopathic Manipulative Therapy  (OMT) was based on physical exam findings, diagnoses and patient wishes. Verbal consent was obtained after after explanation of risks and benefits. No Cervical HVLA manipulation was performed. After consent was obtained, treatment was  performed as below:      Regions treated:  Head, cervical, thoracic, lumbar, pelvis     Techniques used: ME, MR, Cranial, BLT The patient tolerated the treatment well and reported Improved  symptoms following treatment today. Follow up treatment was advised in: 1-2 weeks  -Patient advised to return or notify a doctor immediately if symptoms worsen or persist or new concerns arise.  Patient Instructions  Follow up in 1-2 weeks   Lucretia Kern, DO

## 2017-08-24 ENCOUNTER — Ambulatory Visit (INDEPENDENT_AMBULATORY_CARE_PROVIDER_SITE_OTHER): Payer: PPO | Admitting: Family Medicine

## 2017-08-24 ENCOUNTER — Encounter: Payer: Self-pay | Admitting: Family Medicine

## 2017-08-24 VITALS — BP 100/60 | HR 87 | Temp 98.3°F | Ht 69.0 in | Wt 235.7 lb

## 2017-08-24 DIAGNOSIS — M545 Low back pain, unspecified: Secondary | ICD-10-CM

## 2017-08-24 DIAGNOSIS — G8929 Other chronic pain: Secondary | ICD-10-CM

## 2017-08-24 NOTE — Patient Instructions (Signed)
Follow-up in 1-2 weeks.

## 2017-09-01 ENCOUNTER — Other Ambulatory Visit: Payer: Self-pay | Admitting: Family Medicine

## 2017-09-05 ENCOUNTER — Other Ambulatory Visit: Payer: Self-pay | Admitting: Family Medicine

## 2017-09-21 ENCOUNTER — Ambulatory Visit (INDEPENDENT_AMBULATORY_CARE_PROVIDER_SITE_OTHER): Payer: PPO | Admitting: Family Medicine

## 2017-09-21 ENCOUNTER — Encounter: Payer: Self-pay | Admitting: Family Medicine

## 2017-09-21 VITALS — BP 122/82 | HR 81 | Temp 98.1°F | Ht 69.0 in

## 2017-09-21 DIAGNOSIS — G8929 Other chronic pain: Secondary | ICD-10-CM | POA: Diagnosis not present

## 2017-09-21 DIAGNOSIS — M9903 Segmental and somatic dysfunction of lumbar region: Secondary | ICD-10-CM

## 2017-09-21 DIAGNOSIS — M9902 Segmental and somatic dysfunction of thoracic region: Secondary | ICD-10-CM | POA: Diagnosis not present

## 2017-09-21 DIAGNOSIS — E669 Obesity, unspecified: Secondary | ICD-10-CM | POA: Diagnosis not present

## 2017-09-21 DIAGNOSIS — M9904 Segmental and somatic dysfunction of sacral region: Secondary | ICD-10-CM

## 2017-09-21 DIAGNOSIS — M9901 Segmental and somatic dysfunction of cervical region: Secondary | ICD-10-CM | POA: Diagnosis not present

## 2017-09-21 DIAGNOSIS — N390 Urinary tract infection, site not specified: Secondary | ICD-10-CM | POA: Diagnosis not present

## 2017-09-21 DIAGNOSIS — M9905 Segmental and somatic dysfunction of pelvic region: Secondary | ICD-10-CM

## 2017-09-21 DIAGNOSIS — B9620 Unspecified Escherichia coli [E. coli] as the cause of diseases classified elsewhere: Secondary | ICD-10-CM | POA: Diagnosis not present

## 2017-09-21 DIAGNOSIS — M545 Low back pain: Secondary | ICD-10-CM

## 2017-09-21 NOTE — Progress Notes (Addendum)
HPI:  Using dictation device. Unfortunately this device frequently misinterprets words/phrases.  Acute visit for low back pain: -Chronic, intermittent -Has responded well to injections with Dr. Letta Pate in the past and also to OMM -hx significant degenerative arthritis -Reports she has been actually doing better recently, felt a lot better for the week following her last osteopathic musculoskeletal medicine treatment -No core exercise recently, is trying to get some walking or cycling in daily aerobic exercise -Today feeling okay, back pain triggered by certain activities or getting up from sitting to standing, sometimes with radicular symptoms - not sustained -no fevers, malaise, BB incont reporte  Morbid obesity: - has difficulty sticking with a diet plan -has been very successful with low carb plans in terms of wt reduction in the past, particularly when had frequent follow up for accountability -feels back issues would be better if could lose wt  ROS: See pertinent positives and negatives per HPI.  Past Medical History:  Diagnosis Date  . Arthritis   . Atrial fibrillation ([place])   . Constipation   . Difficult intubation    pt states that her neck needs to be in a neutral position,  scoliosis  . Family history of adverse reaction to anesthesia    nausea  . GERD (gastroesophageal reflux disease)    protonix, hx h. pylori  . History of uterine cancer 04/22/2014   sees Dr. Ronita Hipps; s/p complete hysterectomy  . Hx of colonic polyp   . 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 Melanoma [facility])    sees Dr. Renda Rolls in dermatology  . 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 Peripheral vascular disease ([place])   . PONV (postoperative nausea and vomiting)   . Recurrent UTI   . Recurrent UTI   . S/P hip replacement   . Scoliosis   . Thyroid nodule   . Urinary incontinence   . Venous  insufficiency    s/p ablation    Past Surgical History:  Procedure Laterality Date  . ABDOMINAL HYSTERECTOMY  09/10/2013  . BUNIONECTOMY  10/1976  . COLONOSCOPY    . FRACTURE SURGERY  09/1964  . MELANOMA EXCISION  12/2011  . TOTAL HIP ARTHROPLASTY Left 06/17/2014   Procedure: LEFT TOTAL HIP ARTHROPLASTY ANTERIOR APPROACH;  Surgeon: Mcarthur Rossetti, MD;  Location: [place];  Service: Orthopedics;  Laterality: Left;  . TOTAL HIP ARTHROPLASTY Right 09/02/2014   Procedure: RIGHT TOTAL HIP ARTHROPLASTY ANTERIOR APPROACH;  Surgeon: Mcarthur Rossetti, MD;  Location: [place];  Service: Orthopedics;  Laterality: Right;  . VEIN SURGERY  05/2011   venous ablation     Family History  Problem Relation Age of Onset  . Uterine cancer Mother   . Diabetes Brother 87  . Hypertension Father 77  . Hyperlipidemia Father   . Cancer Father 59  . Epilepsy Father 55  . Arthritis Sister 78  . Cancer Maternal Grandmother   . Stroke Paternal Grandfather   . Arthritis Sister     SOCIAL HX: see hpi   Current Outpatient Medications:  .  apixaban (ELIQUIS) 5 MG TABS tablet, Take 1 tablet (5 mg total) by mouth 2 (two) times daily. BLOOD WORK NEEDED PRIOR TO NEXT REFILL AUTHORIZATION., Disp: 180 tablet, Rfl: 0 .  cyclobenzaprine (FLEXERIL) 5 MG tablet, Take 1 tablet (5 mg total) by mouth 3 (three) times daily as needed for muscle spasms., Disp: 20 tablet, Rfl: 0 .  diltiazem (CARDIZEM) 60  MG tablet, Take 60 mg daily if needed for palpitations, Disp: 30 tablet, Rfl: 6 .  diltiazem (TIAZAC) 240 MG 24 hr capsule, TAKE 1 CAPSULE BY MOUTH EVERY DAY, Disp: 90 capsule, Rfl: 3 .  flecainide (TAMBOCOR) 100 MG tablet, TAKE 1 TABLET(100 MG) BY MOUTH TWICE DAILY, Disp: 180 tablet, Rfl: 3 .  mirabegron ER (MYRBETRIQ) 50 MG TB24 tablet, Take 25 mg by mouth every other day. , Disp: , Rfl:  .  nitrofurantoin (MACRODANTIN) 100 MG capsule, Take 1 capsule twice a day, Disp: , Rfl: 0 .  pantoprazole (PROTONIX) 40 MG tablet,  TAKE 1 TABLET(40 MG) BY MOUTH DAILY, Disp: 90 tablet, Rfl: 0 .  spironolactone (ALDACTONE) 50 MG tablet, TAKE 2 TABLETS BY MOUTH EVERY MORNING, THEN TAKE 1 TABLET EVERY EVENING, Disp: 270 tablet, Rfl: 3 .  triamcinolone cream (KENALOG) 0.1 %, Apply 1 application topically as needed (for dry skin). , Disp: , Rfl: 1  Current Facility-Administered Medications:  .  0.9 %  sodium chloride infusion, 500 mL, Intravenous, Continuous, Milus Banister, MD  EXAM:  Vitals:   09/21/17 1116  BP: 122/82  Pulse: 81  Temp: 98.1 F (36.7 C)    Body mass index is 34.81 kg/m.  GENERAL: vitals reviewed and listed above, alert, oriented, appears well hydrated and in no acute distress  HEENT: atraumatic, conjunttiva clear, no obvious abnormalities on inspection of external nose and ears  NECK: no obvious masses on inspection  MS: moves all extremities without noticeable abnormality, gait is normal, wearing compression socks, T 5-7 RrSl, R ant innominate, L L5 post TP, L unilateral sacral tosion, TI Rr, OA Rr, sub occipital muscle tension  PSYCH: pleasant and cooperative, no obvious depression or anxiety  ASSESSMENT AND PLAN:  Discussed the following assessment and plan: More than 50% of over 25 minutes spent in total in caring for this patient was spent face-to-face with the patient, counseling and/or coordinating care.   Chronic left-sided low back pain, with sciatica presence unspecified -we discussed possible serious and likely etiologies, workup and treatment, treatment risks and return precautions -after this discussion, Amelita opted for: she prefers OMM (see below)over other treatment options for now, along with agreeing to adding in home core strengthening gradually and carefully, and rare use OTC analgesic/muscle relaxer as needed. Advised consideration of follow up with her spine specialist, but she feels is improving and opted to hold off - she agrees to revisit if worsening or not  continuing to improve. Declines formal PT at this time. -also advised wt reduction -follow up advised 2-3 weeks -of course, we advised Lacie  to return or notify a doctor immediately if symptoms worsen or persist or new concerns arise.  Obesity (BMI 30-39.9) -lengthy discussion on options for wt reduction -advised lifelong commitment to healthy low sugar whole foods based diet - discussed various options to help her with this including the [place] weight management clinic, regular follow up here, etc -regular gentle areobic exercise -other options meds/surgery also discussed  Somatic dysfunction of spine, lumbar Somatic dysfunction of spine, cervical Somatic dysfunction of spine, thoracic Somatic dysfunction of spine, sacral Somatic dysfunction of pelvic region -OMM per below  PROCEDURE NOTE : OSTEOPATHIC TREATMENT The decision today to treat with gentle Osteopathic Manipulative Therapy  (OMT) was based on physical exam findings, diagnoses and patient wishes. Verbal consent was obtained after after explanation of risks and benefits. No Cervical HVLA manipulation was performed. After consent was obtained, treatment was  performed as below:  Regions treated:  Lumbar, pelvic, sacral, thoracic, cervical     Techniques used: counterstrain, ME, MR, BLT The patient tolerated the treatment well and reported Improved  symptoms following treatment today. Follow up treatment was advised in: 1-3 weeks  -Patient advised to return or notify a doctor immediately if symptoms worsen or persist or new concerns arise.  Patient Instructions  BEFORE YOU LEAVE: -follow up:  1) OMM in 1-3 weeks for back pain 2) AWV with Manuela Schwartz and f/u with Dr. Maudie Mercury when due in October  Core exercise 3 days per week  Consider follow up with Dr. Letta Pate  Low sugar/low carb healthy diet    Lucretia Kern, DO

## 2017-09-21 NOTE — Patient Instructions (Addendum)
BEFORE YOU LEAVE: -follow up:  1) OMM in 1-3 weeks for back pain 2) AWV with Manuela Schwartz and f/u with Dr. Maudie Mercury when due in October  Core exercise 3 days per week  Consider follow up with Dr. Letta Pate  Low sugar/low carb healthy diet   We recommend the following healthy lifestyle for LIFE: 1) Small portions. But, make sure to get regular (at least 3 per day), healthy meals and small healthy snacks if needed.  2) Eat a healthy clean diet.   TRY TO EAT: -at least 5-7 servings of low sugar, colorful, and nutrient rich vegetables per day (not corn, potatoes or bananas.) -berries are the best choice if you wish to eat fruit (only eat small amounts if trying to reduce weight)  -lean meets (fish, white meat of chicken or [place]) -vegan proteins for some meals - beans or tofu, whole grains, nuts and seeds -Replace bad fats with good fats - good fats include: fish, nuts and seeds, canola oil, olive oil -small amounts of low fat or non fat dairy -small amounts of100 % whole grains - check the lables -drink plenty of water  AVOID: -SUGAR, sweets, anything with added sugar, corn syrup or sweeteners - must read labels as even foods advertised as "healthy" often are loaded with sugar -if you must have a sweetener, small amounts of stevia may be best -sweetened beverages and artificially sweetened beverages -simple starches (rice, bread, potatoes, pasta, chips, etc - small amounts of 100% whole grains are ok) -red meat, pork, butter -fried foods, fast food, processed food, excessive dairy, eggs and coconut.  3)Get at least 150 minutes of sweaty aerobic exercise per week.  4)Reduce stress - consider counseling, meditation and relaxation to balance other aspects of your life.

## 2017-10-04 DIAGNOSIS — E042 Nontoxic multinodular goiter: Secondary | ICD-10-CM | POA: Diagnosis not present

## 2017-10-19 ENCOUNTER — Ambulatory Visit: Payer: PPO | Admitting: Family Medicine

## 2017-10-25 DIAGNOSIS — L723 Sebaceous cyst: Secondary | ICD-10-CM | POA: Diagnosis not present

## 2017-10-25 DIAGNOSIS — L821 Other seborrheic keratosis: Secondary | ICD-10-CM | POA: Diagnosis not present

## 2017-10-25 DIAGNOSIS — L814 Other melanin hyperpigmentation: Secondary | ICD-10-CM | POA: Diagnosis not present

## 2017-10-25 DIAGNOSIS — D225 Melanocytic nevi of trunk: Secondary | ICD-10-CM | POA: Diagnosis not present

## 2017-10-25 DIAGNOSIS — Z85820 Personal history of malignant melanoma of skin: Secondary | ICD-10-CM | POA: Diagnosis not present

## 2017-10-25 DIAGNOSIS — L570 Actinic keratosis: Secondary | ICD-10-CM | POA: Diagnosis not present

## 2017-10-25 DIAGNOSIS — D1800 Hemangioma unspecified site: Secondary | ICD-10-CM | POA: Diagnosis not present

## 2017-12-31 NOTE — Progress Notes (Signed)
Subjective:   [name] is a 67 y.o. female who presents for Medicare Annual (Subsequent) preventive examination.  Reports health as fair Had TAH due to endometrial cancer; stage 1 Also had bladder surgery with good result at the time   In an apt;  No stairs and on the first floor Both hips replaced   Has 2 dtr; 6 and 68 yo There dtr in [place] and has 68 yo and 68 yo twins She is trail attorney  She has been to neuro at [facility] at breakfast, eggs/ cheese or oatmeal Yogurt with fruit or smoothie  Lunch - varied; sits with 68 yo grand dtr Salad with chicken and feel her stomach has been upset Feeling indigestion and thought it may be due to the antibiotic   Exercise Did 20 minutes on a stationary bike for [employer] her dog every day; jack russell  3 times a day  54 2 yo grandchild    Health Maintenance Due  Topic Date Due  . PNA vac Low Risk Adult (2 of 2 - PPSV23) 04/11/2017  . INFLUENZA VACCINE  10/12/2017   Colonoscopy 08/2016 Had colonoscopy last year- father had colon cancer   Mammogram 09/2016 - to schedule this year  She is overdue now  Dexa 03/2012         Objective:     Vitals: BP 120/80   Pulse 74   Ht 5\' 9"  (1.753 m)   Wt 241 lb (109.3 kg)   SpO2 97%   BMI 35.59 kg/m   Body mass index is 35.59 kg/m.  Advanced Directives 01/01/2018 12/29/2016 08/30/2016 10/03/2014 08/28/2014 07/24/2014 07/14/2014  Does Patient Have a Medical Advance Directive? Yes Yes Yes Yes Yes Yes No;Yes  Type of Advance Directive - - Public librarian;Living will Healthcare Power of [place];Living will [place];Living will  Does patient want to make changes to medical advance directive? - - - - No - Patient declined - -  Copy of [place] in Chart? - - - Yes Yes No - copy requested No - copy requested  Would patient like information on creating a medical advance directive? -  - - - - Yes - Scientist, clinical (histocompatibility and immunogenetics) given Yes Higher education careers adviser given    Tobacco Social History   Tobacco Use  Smoking Status Never Smoker  Smokeless Tobacco Never Used     Counseling given: Yes   Clinical Intake:     Past Medical History:  Diagnosis Date  . Arthritis   . Atrial fibrillation ([place])   . Constipation   . Difficult intubation    pt states that her neck needs to be in a neutral position,  scoliosis  . Family history of adverse reaction to anesthesia    nausea  . GERD (gastroesophageal reflux disease)    protonix, hx h. pylori  . History of uterine cancer 04/22/2014   sees Dr. Ronita Hipps; s/p complete hysterectomy  . Hx of colonic polyp   . 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 Melanoma [facility])    sees Dr. Renda Rolls in dermatology  . 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 Peripheral vascular disease ([place])   . PONV (postoperative nausea and vomiting)   . Recurrent UTI   . Recurrent UTI   . S/P hip replacement   . Scoliosis   . Thyroid nodule   .  Urinary incontinence   . Venous insufficiency    s/p ablation   Past Surgical History:  Procedure Laterality Date  . ABDOMINAL HYSTERECTOMY  09/10/2013  . BUNIONECTOMY  10/1976  . COLONOSCOPY    . FRACTURE SURGERY  09/1964  . MELANOMA EXCISION  12/2011  . TOTAL HIP ARTHROPLASTY Left 06/17/2014   Procedure: LEFT TOTAL HIP ARTHROPLASTY ANTERIOR APPROACH;  Surgeon: Mcarthur Rossetti, MD;  Location: [place];  Service: Orthopedics;  Laterality: Left;  . TOTAL HIP ARTHROPLASTY Right 09/02/2014   Procedure: RIGHT TOTAL HIP ARTHROPLASTY ANTERIOR APPROACH;  Surgeon: Mcarthur Rossetti, MD;  Location: [place];  Service: Orthopedics;  Laterality: Right;  . VEIN SURGERY  05/2011   venous ablation    Family History  Problem Relation Age of Onset  . Uterine cancer Mother   . Diabetes Brother 15  . Hypertension Father 80  .  Hyperlipidemia Father   . Cancer Father 34  . Epilepsy Father 2  . Arthritis Sister 41  . Cancer Maternal Grandmother   . Stroke Paternal Grandfather   . Arthritis Sister    Social History   Socioeconomic History  . Marital status: Divorced    Spouse name: Not on file  . Number of children: 2  . Years of education: JD  . Highest education level: Not on file  Occupational History  . Occupation: Chief Executive Officer  Social Needs  . Financial resource strain: Not on file  . Food insecurity:    Worry: Not on file    Inability: Not on file  . Transportation needs:    Medical: Not on file    Non-medical: Not on file  Tobacco Use  . Smoking status: Never Smoker  . Smokeless tobacco: Never Used  Substance and Sexual Activity  . Alcohol use: Yes    Alcohol/week: 0.0 standard drinks    Comment: rarely at [facility]   . Drug use: No  . Sexual activity: Not on file  Lifestyle  . Physical activity:    Days per week: Not on file    Minutes per session: Not on file  . Stress: Not on file  Relationships  . Social connections:    Talks on phone: Not on file    Gets together: Not on file    Attends religious service: Not on file    Active member of club or organization: Not on file    Attends meetings of clubs or organizations: Not on file    Relationship status: Not on file  Other Topics Concern  . Not on file  Social History Narrative   Work or School: retired Forensic psychologist      Home Situation: lives alone - takes care of twin grandchildren 7 months in 05/2015      Spiritual Beliefs:       Lifestyle: active, healthy diet       Outpatient Encounter Medications as of 01/01/2018  Medication Sig  . apixaban (ELIQUIS) 5 MG TABS tablet Take 1 tablet (5 mg total) by mouth 2 (two) times daily. BLOOD WORK NEEDED PRIOR TO NEXT REFILL AUTHORIZATION.  . cyclobenzaprine (FLEXERIL) 5 MG tablet Take 1 tablet (5 mg total) by mouth 3 (three) times daily as needed for muscle spasms.  Marland Kitchen diltiazem  (CARDIZEM) 60 MG tablet Take 60 mg daily if needed for palpitations  . flecainide (TAMBOCOR) 100 MG tablet TAKE 1 TABLET(100 MG) BY MOUTH TWICE DAILY  . methenamine (MANDELAMINE) 0.5 GM tablet Take 500 mg by mouth 4 (four) times daily.  Marland Kitchen  mirabegron ER (MYRBETRIQ) 50 MG TB24 tablet Take 25 mg by mouth every other day.   . pantoprazole (PROTONIX) 40 MG tablet TAKE 1 TABLET(40 MG) BY MOUTH DAILY  . spironolactone (ALDACTONE) 50 MG tablet TAKE 2 TABLETS BY MOUTH EVERY MORNING, THEN TAKE 1 TABLET EVERY EVENING  . triamcinolone cream (KENALOG) 0.1 % Apply 1 application topically as needed (for dry skin).   Marland Kitchen diltiazem (TIAZAC) 240 MG 24 hr capsule TAKE 1 CAPSULE BY MOUTH EVERY DAY (Patient not taking: Reported on 01/01/2018)  . [DISCONTINUED] nitrofurantoin (MACRODANTIN) 100 MG capsule Take 1 capsule twice a day   Facility-Administered Encounter Medications as of 01/01/2018  Medication  . 0.9 %  sodium chloride infusion    Activities of Daily Living In your present state of health, do you have any difficulty performing the following activities: 01/01/2018  Hearing? N  Vision? N  Difficulty concentrating or making decisions? N  Walking or climbing stairs? N  Dressing or bathing? N  Doing errands, shopping? N  Preparing Food and eating ? N  Using the Toilet? N  In the past six months, have you accidently leaked urine? Y  Do you have problems with loss of bowel control? N  Managing your Medications? N  Managing your Finances? N  Housekeeping or managing your Housekeeping? N  Some recent data might be hidden    Patient Care Team: Lucretia Kern, DO as PCP - General (Family Medicine) Debara Pickett Nadean Corwin, MD as Consulting Physician (Cardiology) Brien Few, MD as Consulting Physician (Obstetrics and Gynecology) Devra Dopp, MD as Referring Physician (Dermatology) Ardis Hughs, MD as Attending Physician (Urology)    Assessment:   This is a routine wellness examination  for [place].  Exercise Activities and Dietary recommendations    Goals    . Weight (lb) < 200 lb (90.7 kg)     Go back to riding the stationary bike- try for 30 minutes 5 days a week     . Weight (lb) < 200 lb (90.7 kg)     Went to the diet center in the past  Chicken, fish, [place] and 2 tsp of fat  Fruits and vegetables    Check out  online nutrition programs as GumSearch.nl and http://vang.com/; fit17me; Look for foods with "whole" wheat; bran; oatmeal etc Shot at the farmer's markets in season for fresher choices  Watch for "hydrogenated" on the label of oils which are trans-fats.  Watch for "high fructose corn syrup" in snacks, yogurt or ketchup  Meats have less marbling; bright colored fruits and vegetables;  Canned; dump out liquid and wash vegetables. Be mindful of what we are eating  Portion control is essential to a health weight! Sit down; take a break and enjoy your meal; take smaller bites; put the fork down between bites;  It takes 20 minutes to get full; so check in with your fullness cues and stop eating when you start to fill full              Fall Risk Fall Risk  01/01/2018 12/29/2016 10/03/2014  Falls in the past year? Yes No No  Number falls in past yr: 1 - -  Injury with Fall? Yes - -  Follow up Education provided - -     Depression Screen PHQ 2/9 Scores 01/01/2018 12/29/2016 10/03/2014 06/03/2014  PHQ - 2 Score 0 0 0 1  PHQ- 9 Score - - - 4     Cognitive Function MMSE - Mini Mental State  Exam 01/01/2018  Not completed: (No Data)   Ad8 score reviewed for issues:  Issues making decisions:  Less interest in hobbies / activities:  Repeats questions, stories (family complaining):  Trouble using ordinary gadgets (microwave, computer, phone):  Forgets the month or year:   Mismanaging finances:   Remembering appts:  Daily problems with thinking and/or memory: Ad8 score is=0          Immunization History  Administered Date(s)  Administered  . Influenza, High Dose Seasonal PF 12/29/2016  . Influenza-Unspecified 03/15/2015, 03/12/2016  . Pneumococcal Conjugate-13 04/11/2016  . Td 10/12/2013      Screening Tests Health Maintenance  Topic Date Due  . PNA vac Low Risk Adult (2 of 2 - PPSV23) 04/11/2017  . INFLUENZA VACCINE  10/12/2017  . Hepatitis C Screening  05/24/2025 (Originally 15-Sep-1950)  . MAMMOGRAM  09/12/2018  . COLONOSCOPY  08/31/2019  . TETANUS/TDAP  10/13/2023  . DEXA SCAN  Completed         Plan:      PCP Notes   Health Maintenance Colonoscopy 08/2016 Had colonoscopy last year- father had colon cancer   Mammogram 09/2016 - to schedule this year  She is overdue now  Dexa 03/2012 - reported normal    Abnormal Screens  none  Referrals  none  Patient concerns; C/o of heartburn since she was placed on Methenamine (ordered by Dr. Louis Meckel- apt in Nov)  Taking tums or zantac daily   Nurse Concerns; Advised to make 15 min apt with dr. Maudie Mercury  But opted to schedule annual  Frequent UTI   Next PCP apt CPE 11/26 with Dr. Maudie Mercury        I have personally reviewed and noted the following in the patient's chart:   . Medical and social history . Use of alcohol, tobacco or illicit drugs  . Current medications and supplements . Functional ability and status . Nutritional status . Physical activity . Advanced directives . List of other physicians . Hospitalizations, surgeries, and ER visits in previous 12 months . Vitals . Screenings to include cognitive, depression, and falls . Referrals and appointments  In addition, I have reviewed and discussed with patient certain preventive protocols, quality metrics, and best practice recommendations. A written personalized care plan for preventive services as well as general preventive health recommendations were provided to patient.     Wynetta Fines, RN  01/01/2018

## 2018-01-01 ENCOUNTER — Ambulatory Visit (INDEPENDENT_AMBULATORY_CARE_PROVIDER_SITE_OTHER): Payer: PPO

## 2018-01-01 DIAGNOSIS — Z23 Encounter for immunization: Secondary | ICD-10-CM | POA: Diagnosis not present

## 2018-01-01 DIAGNOSIS — Z0000 Encounter for general adult medical examination without abnormal findings: Secondary | ICD-10-CM | POA: Diagnosis not present

## 2018-01-01 NOTE — Patient Instructions (Addendum)
[name] , Thank you for taking time to come for your Medicare Wellness Visit. I appreciate your ongoing commitment to your health goals. Please review the following plan we discussed and let me know if I can assist you in the future.   Will schedule a hearing exam  [facility] - can assist with hearing aid x 1  No reviews  [facility]  [place] [id number]  [phone]  [email or web address]   Please schedule with Dr. Maudie Mercury when leaving today for your annual. You will need blood which is in the system  Also need fup apt with her sooner to discuss stomach issues   Will fup with Dr. Ronita Hipps;  Will have mammogram with Dr. Ronita Hipps this year   Shingrix is a vaccine for the prevention of Shingles in Adults 56 and older.  If you are on Medicare, the shingrix is covered under your Part D plan, so you will take both of the vaccines in the series at your pharmacy. Please check with your benefits regarding applicable copays or out of pocket expenses.  The Shingrix is given in 2 vaccines approx 8 weeks apart. You must receive the 2nd dose prior to 6 months from receipt of the first. Please have the pharmacist print out you Immunization  dates for our office records   A Tetanus is recommended every 10 years. Medicare covers a tetanus if you have a cut or wound; otherwise, there may be a charge. If you had not had a tetanus with pertusses, known as the Tdap, you can take this anytime.    These are the goals we discussed: Goals    . Weight (lb) < 200 lb (90.7 kg)     Go back to riding the stationary bike- try for 30 minutes 5 days a week        This is a list of the screening recommended for you and due dates:  Health Maintenance  Topic Date Due  . Pneumonia vaccines (2 of 2 - PPSV23) 04/11/2017  . Flu Shot  10/12/2017  .  Hepatitis C: One time screening is  recommended by Center for Disease Control  (CDC) for  adults born from [birth year] through 1966.   05/24/2025*  . Mammogram  09/12/2018  . Colon Cancer Screening  08/31/2019  . Tetanus Vaccine  10/13/2023  . DEXA scan (bone density measurement)  Completed  *Topic was postponed. The date shown is not the original due date.      Fall Prevention in the Home Falls can cause injuries. They can happen to people of all ages. There are many things you can do to make your home safe and to help prevent falls. What can I do on the outside of my home?  Regularly fix the edges of walkways and driveways and fix any cracks.  Remove anything that might make you trip as you walk through a door, such as a raised step or threshold.  Trim any bushes or trees on the path to your home.  Use bright outdoor lighting.  Clear any walking paths of anything that might make someone trip, such as rocks or tools.  Regularly check to see if handrails are loose or broken. Make sure that both sides of any steps have handrails.  Any raised decks and porches should have guardrails on the edges.  Have any leaves, snow, or ice cleared regularly.  Use sand or salt on walking paths during winter.  Clean  up any spills in your garage right away. This includes oil or grease spills. What can I do in the bathroom?  Use night lights.  Install grab bars by the toilet and in the tub and shower. Do not use towel bars as grab bars.  Use non-skid mats or decals in the tub or shower.  If you need to sit down in the shower, use a plastic, non-slip stool.  Keep the floor dry. Clean up any water that spills on the floor as soon as it happens.  Remove soap buildup in the tub or shower regularly.  Attach bath mats securely with double-sided non-slip rug tape.  Do not have throw rugs and other things on the floor that can make you trip. What can I do in the bedroom?  Use night lights.  Make sure that you have a light by your  bed that is easy to reach.  Do not use any sheets or blankets that are too big for your bed. They should not hang down onto the floor.  Have a firm chair that has side arms. You can use this for support while you get dressed.  Do not have throw rugs and other things on the floor that can make you trip. What can I do in the kitchen?  Clean up any spills right away.  Avoid walking on wet floors.  Keep items that you use a lot in easy-to-reach places.  If you need to reach something above you, use a strong step stool that has a grab bar.  Keep electrical cords out of the way.  Do not use floor polish or wax that makes floors slippery. If you must use wax, use non-skid floor wax.  Do not have throw rugs and other things on the floor that can make you trip. What can I do with my stairs?  Do not leave any items on the stairs.  Make sure that there are handrails on both sides of the stairs and use them. Fix handrails that are broken or loose. Make sure that handrails are as long as the stairways.  Check any carpeting to make sure that it is firmly attached to the stairs. Fix any carpet that is loose or worn.  Avoid having throw rugs at the top or bottom of the stairs. If you do have throw rugs, attach them to the floor with carpet tape.  Make sure that you have a light switch at the top of the stairs and the bottom of the stairs. If you do not have them, ask someone to add them for you. What else can I do to help prevent falls?  Wear shoes that: ? Do not have high heels. ? Have rubber bottoms. ? Are comfortable and fit you well. ? Are closed at the toe. Do not wear sandals.  If you use a stepladder: ? Make sure that it is fully opened. Do not climb a closed stepladder. ? Make sure that both sides of the stepladder are locked into place. ? Ask someone to hold it for you, if possible.  Clearly mark and make sure that you can see: ? Any grab bars or handrails. ? First and last  steps. ? Where the edge of each step is.  Use tools that help you move around (mobility aids) if they are needed. These include: ? Canes. ? Walkers. ? Scooters. ? Crutches.  Turn on the lights when you go into a dark area. Replace any light bulbs as soon as  they burn out.  Set up your furniture so you have a clear path. Avoid moving your furniture around.  If any of your floors are uneven, fix them.  If there are any pets around you, be aware of where they are.  Review your medicines with your doctor. Some medicines can make you feel dizzy. This can increase your chance of falling. Ask your doctor what other things that you can do to help prevent falls. This information is not intended to replace advice given to you by your health care provider. Make sure you discuss any questions you have with your health care provider. Document Released: 12/25/2008 Document Revised: 08/06/2015 Document Reviewed: 04/04/2014 Elsevier Interactive Patient Education  2018 [place] Maintenance, Female Adopting a healthy lifestyle and getting preventive care can go a long way to promote health and wellness. Talk with your health care provider about what schedule of regular examinations is right for you. This is a good chance for you to check in with your provider about disease prevention and staying healthy. In between checkups, there are plenty of things you can do on your own. Experts have done a lot of research about which lifestyle changes and preventive measures are most likely to keep you healthy. Ask your health care provider for more information. Weight and diet Eat a healthy diet  Be sure to include plenty of vegetables, fruits, low-fat dairy products, and lean protein.  Do not eat a lot of foods high in solid fats, added sugars, or salt.  Get regular exercise. This is one of the most important things you can do for your health. ? Most adults should exercise for at least 150  minutes each week. The exercise should increase your heart rate and make you sweat (moderate-intensity exercise). ? Most adults should also do strengthening exercises at least twice a week. This is in addition to the moderate-intensity exercise.  Maintain a healthy weight  Body mass index (BMI) is a measurement that can be used to identify possible weight problems. It estimates body fat based on height and weight. Your health care provider can help determine your BMI and help you achieve or maintain a healthy weight.  For females 51 years of age and older: ? A BMI below 18.5 is considered underweight. ? A BMI of 18.5 to 24.9 is normal. ? A BMI of 25 to 29.9 is considered overweight. ? A BMI of 30 and above is considered obese.  Watch levels of cholesterol and blood lipids  You should start having your blood tested for lipids and cholesterol at 68 years of age, then have this test every 5 years.  You may need to have your cholesterol levels checked more often if: ? Your lipid or cholesterol levels are high. ? You are older than 68 years of age. ? You are at high risk for heart disease.  Cancer screening Lung Cancer  Lung cancer screening is recommended for adults 86-37 years old who are at high risk for lung cancer because of a history of smoking.  A yearly low-dose CT scan of the lungs is recommended for people who: ? Currently smoke. ? Have quit within the past 15 years. ? Have at least a 30-pack-year history of smoking. A pack year is smoking an average of one pack of cigarettes a day for 1 year.  Yearly screening should continue until it has been 15 years since you quit.  Yearly screening should stop if you develop a health problem that  would prevent you from having lung cancer treatment.  Breast Cancer  Practice breast self-awareness. This means understanding how your breasts normally appear and feel.  It also means doing regular breast self-exams. Let your health care  provider know about any changes, no matter how small.  If you are in your 20s or 30s, you should have a clinical breast exam (CBE) by a health care provider every 1-3 years as part of a regular health exam.  If you are 60 or older, have a CBE every year. Also consider having a breast X-ray (mammogram) every year.  If you have a family history of breast cancer, talk to your health care provider about genetic screening.  If you are at high risk for breast cancer, talk to your health care provider about having an MRI and a mammogram every year.  Breast cancer gene (BRCA) assessment is recommended for women who have family members with BRCA-related cancers. BRCA-related cancers include: ? Breast. ? Ovarian. ? Tubal. ? Peritoneal cancers.  Results of the assessment will determine the need for genetic counseling and BRCA1 and BRCA2 testing.  Cervical Cancer Your health care provider may recommend that you be screened regularly for cancer of the pelvic organs (ovaries, uterus, and vagina). This screening involves a pelvic examination, including checking for microscopic changes to the surface of your cervix (Pap test). You may be encouraged to have this screening done every 3 years, beginning at age 68.  For women ages 78-65, health care providers may recommend pelvic exams and Pap testing every 3 years, or they may recommend the Pap and pelvic exam, combined with testing for human papilloma virus (HPV), every 5 years. Some types of HPV increase your risk of cervical cancer. Testing for HPV may also be done on women of any age with unclear Pap test results.  Other health care providers may not recommend any screening for nonpregnant women who are considered low risk for pelvic cancer and who do not have symptoms. Ask your health care provider if a screening pelvic exam is right for you.  If you have had past treatment for cervical cancer or a condition that could lead to cancer, you need Pap tests  and screening for cancer for at least 20 years after your treatment. If Pap tests have been discontinued, your risk factors (such as having a new sexual partner) need to be reassessed to determine if screening should resume. Some women have medical problems that increase the chance of getting cervical cancer. In these cases, your health care provider may recommend more frequent screening and Pap tests.  Colorectal Cancer  This type of cancer can be detected and often prevented.  Routine colorectal cancer screening usually begins at 68 years of age and continues through 68 years of age.  Your health care provider may recommend screening at an earlier age if you have risk factors for colon cancer.  Your health care provider may also recommend using home test kits to check for hidden blood in the stool.  A small camera at the end of a tube can be used to examine your colon directly (sigmoidoscopy or colonoscopy). This is done to check for the earliest forms of colorectal cancer.  Routine screening usually begins at age 60.  Direct examination of the colon should be repeated every 5-10 years through 68 years of age. However, you may need to be screened more often if early forms of precancerous polyps or small growths are found.  Skin Cancer  Check  your skin from head to toe regularly.  Tell your health care provider about any new moles or changes in moles, especially if there is a change in a mole's shape or color.  Also tell your health care provider if you have a mole that is larger than the size of a pencil eraser.  Always use sunscreen. Apply sunscreen liberally and repeatedly throughout the day.  Protect yourself by wearing long sleeves, pants, a wide-brimmed hat, and sunglasses whenever you are outside.  Heart disease, diabetes, and high blood pressure  High blood pressure causes heart disease and increases the risk of stroke. High blood pressure is more likely to develop  in: ? People who have blood pressure in the high end of the normal range (130-139/85-89 mm Hg). ? People who are overweight or obese. ? People who are African American.  If you are 59-32 years of age, have your blood pressure checked every 3-5 years. If you are 58 years of age or older, have your blood pressure checked every year. You should have your blood pressure measured twice-once when you are at a hospital or clinic, and once when you are not at a hospital or clinic. Record the average of the two measurements. To check your blood pressure when you are not at a hospital or clinic, you can use: ? An automated blood pressure machine at a pharmacy. ? A home blood pressure monitor.  If you are between 24 years and 29 years old, ask your health care provider if you should take aspirin to prevent strokes.  Have regular diabetes screenings. This involves taking a blood sample to check your fasting blood sugar level. ? If you are at a normal weight and have a low risk for diabetes, have this test once every three years after 68 years of age. ? If you are overweight and have a high risk for diabetes, consider being tested at a younger age or more often. Preventing infection Hepatitis B  If you have a higher risk for hepatitis B, you should be screened for this virus. You are considered at high risk for hepatitis B if: ? You were born in a country where hepatitis B is common. Ask your health care provider which countries are considered high risk. ? Your parents were born in a high-risk country, and you have not been immunized against hepatitis B (hepatitis B vaccine). ? You have HIV or AIDS. ? You use needles to inject street drugs. ? You live with someone who has hepatitis B. ? You have had sex with someone who has hepatitis B. ? You get hemodialysis treatment. ? You take certain medicines for conditions, including cancer, organ transplantation, and autoimmune conditions.  Hepatitis C  Blood  testing is recommended for: ? Everyone born from [birth year] through 1966. ? Anyone with known risk factors for hepatitis C.  Sexually transmitted infections (STIs)  You should be screened for sexually transmitted infections (STIs) including gonorrhea and chlamydia if: ? You are sexually active and are younger than 68 years of age. ? You are older than 68 years of age and your health care provider tells you that you are at risk for this type of infection. ? Your sexual activity has changed since you were last screened and you are at an increased risk for chlamydia or gonorrhea. Ask your health care provider if you are at risk.  If you do not have HIV, but are at risk, it may be recommended that you take a prescription medicine  daily to prevent HIV infection. This is called pre-exposure prophylaxis (PrEP). You are considered at risk if: ? You are sexually active and do not regularly use condoms or know the HIV status of your partner(s). ? You take drugs by injection. ? You are sexually active with a partner who has HIV.  Talk with your health care provider about whether you are at high risk of being infected with HIV. If you choose to begin PrEP, you should first be tested for HIV. You should then be tested every 3 months for as long as you are taking PrEP. Pregnancy  If you are premenopausal and you may become pregnant, ask your health care provider about preconception counseling.  If you may become pregnant, take 400 to 800 micrograms (mcg) of folic acid every day.  If you want to prevent pregnancy, talk to your health care provider about birth control (contraception). Osteoporosis and menopause  Osteoporosis is a disease in which the bones lose minerals and strength with aging. This can result in serious bone fractures. Your risk for osteoporosis can be identified using a bone density scan.  If you are 29 years of age or older, or if you are at risk for osteoporosis and fractures, ask your  health care provider if you should be screened.  Ask your health care provider whether you should take a calcium or vitamin D supplement to lower your risk for osteoporosis.  Menopause may have certain physical symptoms and risks.  Hormone replacement therapy may reduce some of these symptoms and risks. Talk to your health care provider about whether hormone replacement therapy is right for you. Follow these instructions at home:  Schedule regular health, dental, and eye exams.  Stay current with your immunizations.  Do not use any tobacco products including cigarettes, chewing tobacco, or electronic cigarettes.  If you are pregnant, do not drink alcohol.  If you are breastfeeding, limit how much and how often you drink alcohol.  Limit alcohol intake to no more than 1 drink per day for nonpregnant women. One drink equals 12 ounces of beer, 5 ounces of wine, or 1 ounces of hard liquor.  Do not use street drugs.  Do not share needles.  Ask your health care provider for help if you need support or information about quitting drugs.  Tell your health care provider if you often feel depressed.  Tell your health care provider if you have ever been abused or do not feel safe at home. This information is not intended to replace advice given to you by your health care provider. Make sure you discuss any questions you have with your health care provider. Document Released: 09/13/2010 Document Revised: 08/06/2015 Document Reviewed: 12/02/2014 Elsevier Interactive Patient Education  [employer].

## 2018-01-01 NOTE — Progress Notes (Signed)
Hannah R Kim, DO  

## 2018-01-23 DIAGNOSIS — N3281 Overactive bladder: Secondary | ICD-10-CM | POA: Diagnosis not present

## 2018-01-23 DIAGNOSIS — N3020 Other chronic cystitis without hematuria: Secondary | ICD-10-CM | POA: Diagnosis not present

## 2018-01-29 DIAGNOSIS — N3281 Overactive bladder: Secondary | ICD-10-CM | POA: Diagnosis not present

## 2018-02-04 NOTE — Progress Notes (Deleted)
HPI:  Using dictation device. Unfortunately this device frequently misinterprets words/phrases.  Here for CPE: Due for fasting labs, ? Mammo/dexa - sees Dr. Ronita Hipps in gyn. Sees dermatologist for skin exams.  -Concerns and/or follow up today: none ***  Chronic medical problems summarized below were reviewed for changes.***.   AWV with Manuela Schwartz 01/01/18  PAF: -sees Dr. Debara Pickett, cardiologist, for management -meds:eliquis, diltiazem, flecainide, spironolactone  GERD: -meds: pantoprazole  Chronic back pain: -has seen PMR and responded well to injs in the past, Dr. Letta Pate -also has responded well to OMM -uses muscle relaxer as needed -hx sig deg arthritis  Obesity: -healthy lifestyle advised -lipid and diabetes screening normal in 2017   -Diet: variety of foods, balance and well rounded, larger portion sizes -Exercise: no regular exercise -Taking folic acid, vitamin D or calcium: no -Diabetes and Dyslipidemia Screening:normalin 2017 -Vaccines: see vaccine section EPIC -pap history: s/p TAH for endometrial ca - sees gyn, Dr. Ronita Hipps -FDLMP: see nursing notes -sexual activity: not discussed -wants STI testing (Hep C if born [birth year]-65): no -FH breast, colon or ovarian ca: see FH Last mammogram: ? Sees gyn Dr. Ronita Hipps Last colon cancer screening: 08/2016, father with colon ca Breast Ca Risk Assessment: see family history and pt history DEXA (>/= 68): done 03/2012; sees gyn, Dr. Ronita Hipps  -Alcohol, Tobacco, drug use: see social history  Review of Systems - no fevers, unintentional weight loss, vision loss, hearing loss, chest pain, sob, hemoptysis, melena, hematochezia, hematuria, genital discharge, changing or concerning skin lesions, bleeding, bruising, loc, thoughts of self harm or SI  Past Medical History:  Diagnosis Date  . Arthritis   . Atrial fibrillation ([place])   . Constipation   . Difficult intubation    pt states that her neck needs to be in a neutral position,   scoliosis  . Family history of adverse reaction to anesthesia    nausea  . GERD (gastroesophageal reflux disease)    protonix, hx h. pylori  . History of uterine cancer 04/22/2014   sees Dr. Ronita Hipps; s/p complete hysterectomy  . Hx of colonic polyp   . 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 Melanoma [facility])    sees Dr. Renda Rolls in dermatology  . 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 Peripheral vascular disease ([place])   . PONV (postoperative nausea and vomiting)   . Recurrent UTI   . Recurrent UTI   . S/P hip replacement   . Scoliosis   . Thyroid nodule   . Urinary incontinence   . Venous insufficiency    s/p ablation    Past Surgical History:  Procedure Laterality Date  . ABDOMINAL HYSTERECTOMY  09/10/2013  . BUNIONECTOMY  10/1976  . COLONOSCOPY    . FRACTURE SURGERY  09/1964  . MELANOMA EXCISION  12/2011  . TOTAL HIP ARTHROPLASTY Left 06/17/2014   Procedure: LEFT TOTAL HIP ARTHROPLASTY ANTERIOR APPROACH;  Surgeon: Mcarthur Rossetti, MD;  Location: [place];  Service: Orthopedics;  Laterality: Left;  . TOTAL HIP ARTHROPLASTY Right 09/02/2014   Procedure: RIGHT TOTAL HIP ARTHROPLASTY ANTERIOR APPROACH;  Surgeon: Mcarthur Rossetti, MD;  Location: [place];  Service: Orthopedics;  Laterality: Right;  . VEIN SURGERY  05/2011   venous ablation     Family History  Problem Relation Age of Onset  . Uterine cancer Mother   . Diabetes Brother 4  . Hypertension Father 51  . Hyperlipidemia Father   . Cancer Father  30  . Epilepsy Father 66  . Arthritis Sister 98  . Cancer Maternal Grandmother   . Stroke Paternal Grandfather   . Arthritis Sister     Social History   Socioeconomic History  . Marital status: Divorced    Spouse name: Not on file  . Number of children: 2  . Years of education: JD  . Highest education level: Not on file  Occupational History  . Occupation: Chief Executive Officer   Social Needs  . Financial resource strain: Not on file  . Food insecurity:    Worry: Not on file    Inability: Not on file  . Transportation needs:    Medical: Not on file    Non-medical: Not on file  Tobacco Use  . Smoking status: Never Smoker  . Smokeless tobacco: Never Used  Substance and Sexual Activity  . Alcohol use: Yes    Alcohol/week: 0.0 standard drinks    Comment: rarely at [facility]   . Drug use: No  . Sexual activity: Not on file  Lifestyle  . Physical activity:    Days per week: Not on file    Minutes per session: Not on file  . Stress: Not on file  Relationships  . Social connections:    Talks on phone: Not on file    Gets together: Not on file    Attends religious service: Not on file    Active member of club or organization: Not on file    Attends meetings of clubs or organizations: Not on file    Relationship status: Not on file  Other Topics Concern  . Not on file  Social History Narrative   Work or School: retired Forensic psychologist      Home Situation: lives alone - takes care of twin grandchildren 7 months in 05/2015      Spiritual Beliefs:       Lifestyle: active, healthy diet        Current Outpatient Medications:  .  apixaban (ELIQUIS) 5 MG TABS tablet, Take 1 tablet (5 mg total) by mouth 2 (two) times daily. BLOOD WORK NEEDED PRIOR TO NEXT REFILL AUTHORIZATION., Disp: 180 tablet, Rfl: 0 .  cyclobenzaprine (FLEXERIL) 5 MG tablet, Take 1 tablet (5 mg total) by mouth 3 (three) times daily as needed for muscle spasms., Disp: 20 tablet, Rfl: 0 .  diltiazem (CARDIZEM) 60 MG tablet, Take 60 mg daily if needed for palpitations, Disp: 30 tablet, Rfl: 6 .  diltiazem (TIAZAC) 240 MG 24 hr capsule, TAKE 1 CAPSULE BY MOUTH EVERY DAY (Patient not taking: Reported on 01/01/2018), Disp: 90 capsule, Rfl: 3 .  flecainide (TAMBOCOR) 100 MG tablet, TAKE 1 TABLET(100 MG) BY MOUTH TWICE DAILY, Disp: 180 tablet, Rfl: 3 .  methenamine (MANDELAMINE) 0.5 GM tablet, Take 500 mg  by mouth 4 (four) times daily., Disp: , Rfl:  .  mirabegron ER (MYRBETRIQ) 50 MG TB24 tablet, Take 25 mg by mouth every other day. , Disp: , Rfl:  .  pantoprazole (PROTONIX) 40 MG tablet, TAKE 1 TABLET(40 MG) BY MOUTH DAILY, Disp: 90 tablet, Rfl: 0 .  spironolactone (ALDACTONE) 50 MG tablet, TAKE 2 TABLETS BY MOUTH EVERY MORNING, THEN TAKE 1 TABLET EVERY EVENING, Disp: 270 tablet, Rfl: 3 .  triamcinolone cream (KENALOG) 0.1 %, Apply 1 application topically as needed (for dry skin). , Disp: , Rfl: 1  Current Facility-Administered Medications:  .  0.9 %  sodium chloride infusion, 500 mL, Intravenous, Continuous, Milus Banister, MD  EXAM:  There  were no vitals filed for this visit.  GENERAL: vitals reviewed and listed below, alert, oriented, appears well hydrated and in no acute distress  HEENT: head atraumatic, PERRLA, normal appearance of eyes, ears, nose and mouth. moist mucus membranes.  NECK: supple, no masses or lymphadenopathy  LUNGS: clear to auscultation bilaterally, no rales, rhonchi or wheeze  CV: HRRR, no peripheral edema or cyanosis, normal pedal pulses  ABDOMEN: bowel sounds normal, soft, non tender to palpation, no masses, no rebound or guarding  GU/BREAST: ***  SKIN: no rash or abnormal lesions  MS: normal gait, moves all extremities normally  NEURO: normal gait, speech and thought processing grossly intact, muscle tone grossly intact throughout  PSYCH: normal affect, pleasant and cooperative  ASSESSMENT AND PLAN:  Discussed the following assessment and plan:  PREVENTIVE EXAM: -Discussed and advised all [place] preventive services health task force level A and B recommendations for age, sex and risks. -Advised at least 150 minutes of exercise per week and a healthy diet with avoidance of (less then 1 serving per week) processed foods, white starches, red meat, fast foods and sweets and consisting of: * 5-9 servings of fresh fruits and vegetables (not corn or  potatoes) *nuts and seeds, beans *olives and olive oil *lean meats such as fish and white chicken  *whole grains -labs, studies and vaccines per orders this encounter  There are no diagnoses linked to this encounter. ***  Patient advised to return to clinic immediately if symptoms worsen or persist or new concerns.  There are no Patient Instructions on file for this visit.  No follow-ups on file.  Lucretia Kern, DO

## 2018-02-06 ENCOUNTER — Encounter: Payer: PPO | Admitting: Family Medicine

## 2018-02-09 DIAGNOSIS — J189 Pneumonia, unspecified organism: Secondary | ICD-10-CM | POA: Diagnosis not present

## 2018-02-10 ENCOUNTER — Encounter (HOSPITAL_COMMUNITY): Payer: Self-pay

## 2018-02-10 ENCOUNTER — Inpatient Hospital Stay (HOSPITAL_COMMUNITY)
Admission: EM | Admit: 2018-02-10 | Discharge: 2018-02-13 | DRG: 871 | Disposition: A | Discharge status: Home / Self Care | Payer: PPO | Source: Ambulatory Visit | Attending: Internal Medicine | Admitting: Internal Medicine

## 2018-02-10 ENCOUNTER — Other Ambulatory Visit: Payer: Self-pay

## 2018-02-10 ENCOUNTER — Emergency Department (HOSPITAL_COMMUNITY): Payer: PPO

## 2018-02-10 DIAGNOSIS — E876 Hypokalemia: Secondary | ICD-10-CM | POA: Diagnosis not present

## 2018-02-10 DIAGNOSIS — J189 Pneumonia, unspecified organism: Secondary | ICD-10-CM | POA: Diagnosis not present

## 2018-02-10 DIAGNOSIS — Z833 Family history of diabetes mellitus: Secondary | ICD-10-CM

## 2018-02-10 DIAGNOSIS — Z8249 Family history of ischemic heart disease and other diseases of the circulatory system: Secondary | ICD-10-CM

## 2018-02-10 DIAGNOSIS — R7989 Other specified abnormal findings of blood chemistry: Secondary | ICD-10-CM | POA: Diagnosis present

## 2018-02-10 DIAGNOSIS — Z85820 Personal history of malignant melanoma of skin: Secondary | ICD-10-CM

## 2018-02-10 DIAGNOSIS — Z820 Family history of epilepsy and other diseases of the nervous system: Secondary | ICD-10-CM | POA: Diagnosis not present

## 2018-02-10 DIAGNOSIS — E871 Hypo-osmolality and hyponatremia: Secondary | ICD-10-CM | POA: Diagnosis not present

## 2018-02-10 DIAGNOSIS — Z8349 Family history of other endocrine, nutritional and metabolic diseases: Secondary | ICD-10-CM

## 2018-02-10 DIAGNOSIS — R918 Other nonspecific abnormal finding of lung field: Secondary | ICD-10-CM | POA: Diagnosis not present

## 2018-02-10 DIAGNOSIS — Z8049 Family history of malignant neoplasm of other genital organs: Secondary | ICD-10-CM | POA: Diagnosis not present

## 2018-02-10 DIAGNOSIS — R740 Nonspecific elevation of levels of transaminase and lactic acid dehydrogenase [LDH]: Secondary | ICD-10-CM | POA: Diagnosis not present

## 2018-02-10 DIAGNOSIS — I872 Venous insufficiency (chronic) (peripheral): Secondary | ICD-10-CM | POA: Diagnosis not present

## 2018-02-10 DIAGNOSIS — Z87440 Personal history of urinary (tract) infections: Secondary | ICD-10-CM | POA: Diagnosis not present

## 2018-02-10 DIAGNOSIS — Z8542 Personal history of malignant neoplasm of other parts of uterus: Secondary | ICD-10-CM | POA: Diagnosis not present

## 2018-02-10 DIAGNOSIS — Z8261 Family history of arthritis: Secondary | ICD-10-CM | POA: Diagnosis not present

## 2018-02-10 DIAGNOSIS — K5900 Constipation, unspecified: Secondary | ICD-10-CM | POA: Diagnosis present

## 2018-02-10 DIAGNOSIS — K828 Other specified diseases of gallbladder: Secondary | ICD-10-CM | POA: Diagnosis present

## 2018-02-10 DIAGNOSIS — N3281 Overactive bladder: Secondary | ICD-10-CM | POA: Diagnosis present

## 2018-02-10 DIAGNOSIS — R0902 Hypoxemia: Secondary | ICD-10-CM | POA: Diagnosis present

## 2018-02-10 DIAGNOSIS — Z7901 Long term (current) use of anticoagulants: Secondary | ICD-10-CM | POA: Diagnosis not present

## 2018-02-10 DIAGNOSIS — I4891 Unspecified atrial fibrillation: Secondary | ICD-10-CM | POA: Diagnosis not present

## 2018-02-10 DIAGNOSIS — A419 Sepsis, unspecified organism: Principal | ICD-10-CM | POA: Diagnosis present

## 2018-02-10 DIAGNOSIS — Z823 Family history of stroke: Secondary | ICD-10-CM | POA: Diagnosis not present

## 2018-02-10 DIAGNOSIS — E860 Dehydration: Secondary | ICD-10-CM | POA: Diagnosis not present

## 2018-02-10 DIAGNOSIS — I480 Paroxysmal atrial fibrillation: Secondary | ICD-10-CM | POA: Diagnosis not present

## 2018-02-10 DIAGNOSIS — Z96643 Presence of artificial hip joint, bilateral: Secondary | ICD-10-CM | POA: Diagnosis present

## 2018-02-10 DIAGNOSIS — Z79899 Other long term (current) drug therapy: Secondary | ICD-10-CM

## 2018-02-10 DIAGNOSIS — J188 Other pneumonia, unspecified organism: Secondary | ICD-10-CM | POA: Diagnosis not present

## 2018-02-10 DIAGNOSIS — R945 Abnormal results of liver function studies: Secondary | ICD-10-CM

## 2018-02-10 LAB — URINALYSIS, ROUTINE W REFLEX MICROSCOPIC
Bilirubin Urine: NEGATIVE
GLUCOSE, UA: 50 mg/dL — AB
HGB URINE DIPSTICK: NEGATIVE
Ketones, ur: 5 mg/dL — AB
Nitrite: NEGATIVE
PROTEIN: 100 mg/dL — AB
SPECIFIC GRAVITY, URINE: 1.02 (ref 1.005–1.030)
pH: 6 (ref 5.0–8.0)

## 2018-02-10 LAB — COMPREHENSIVE METABOLIC PANEL
ALK PHOS: 78 U/L (ref 38–126)
ALT: 63 U/L — ABNORMAL HIGH (ref 0–44)
ANION GAP: 13 (ref 5–15)
AST: 42 U/L — ABNORMAL HIGH (ref 15–41)
Albumin: 3.7 g/dL (ref 3.5–5.0)
BILIRUBIN TOTAL: 0.6 mg/dL (ref 0.3–1.2)
BUN: 20 mg/dL (ref 8–23)
CALCIUM: 8.4 mg/dL — AB (ref 8.9–10.3)
CO2: 23 mmol/L (ref 22–32)
Chloride: 97 mmol/L — ABNORMAL LOW (ref 98–111)
Creatinine, Ser: 0.91 mg/dL (ref 0.44–1.00)
Glucose, Bld: 142 mg/dL — ABNORMAL HIGH (ref 70–99)
POTASSIUM: 3.2 mmol/L — AB (ref 3.5–5.1)
SODIUM: 133 mmol/L — AB (ref 135–145)
TOTAL PROTEIN: 7.2 g/dL (ref 6.5–8.1)

## 2018-02-10 LAB — CBC
HCT: 39.8 % (ref 36.0–46.0)
Hemoglobin: 13.7 g/dL (ref 12.0–15.0)
MCH: 34.3 pg — AB (ref 26.0–34.0)
MCHC: 34.4 g/dL (ref 30.0–36.0)
MCV: 99.7 fL (ref 80.0–100.0)
PLATELETS: 211 10*3/uL (ref 150–400)
RBC: 3.99 MIL/uL (ref 3.87–5.11)
RDW: 12.3 % (ref 11.5–15.5)
WBC: 11.3 10*3/uL — ABNORMAL HIGH (ref 4.0–10.5)
nRBC: 0 % (ref 0.0–0.2)

## 2018-02-10 LAB — I-STAT TROPONIN, ED: TROPONIN I, POC: 0.01 ng/mL (ref 0.00–0.08)

## 2018-02-10 LAB — I-STAT CG4 LACTIC ACID, ED: Lactic Acid, Venous: 2.02 mmol/L (ref 0.5–1.9)

## 2018-02-10 LAB — BRAIN NATRIURETIC PEPTIDE: B Natriuretic Peptide: 207 pg/mL — ABNORMAL HIGH (ref 0.0–100.0)

## 2018-02-10 LAB — INFLUENZA PANEL BY PCR (TYPE A & B)
Influenza A By PCR: NEGATIVE
Influenza B By PCR: NEGATIVE

## 2018-02-10 LAB — MAGNESIUM: Magnesium: 2 mg/dL (ref 1.7–2.4)

## 2018-02-10 MED ORDER — ACETAMINOPHEN 325 MG PO TABS
650.0000 mg | ORAL_TABLET | Freq: Once | ORAL | Status: AC
  Administered 2018-02-10: 650 mg via ORAL
  Filled 2018-02-10: qty 2

## 2018-02-10 MED ORDER — CYCLOBENZAPRINE HCL 5 MG PO TABS: 5.0000 mg | ORAL_TABLET | Freq: Three times a day (TID) | ORAL | Status: DC | PRN

## 2018-02-10 MED ORDER — FLECAINIDE ACETATE 100 MG PO TABS
100.0000 mg | ORAL_TABLET | Freq: Two times a day (BID) | ORAL | Status: DC
  Administered 2018-02-11 – 2018-02-13 (×5): 100 mg via ORAL
  Filled 2018-02-10 (×5): qty 1

## 2018-02-10 MED ORDER — SPIRONOLACTONE 25 MG PO TABS
50.0000 mg | ORAL_TABLET | Freq: Every evening | ORAL | Status: DC
  Administered 2018-02-11 – 2018-02-12 (×2): 50 mg via ORAL
  Filled 2018-02-10 (×2): qty 2

## 2018-02-10 MED ORDER — HYDROCOD POLST-CPM POLST ER 10-8 MG/5ML PO SUER
5.0000 mL | Freq: Once | ORAL | Status: AC
  Administered 2018-02-11: 5 mL via ORAL
  Filled 2018-02-10: qty 5

## 2018-02-10 MED ORDER — APIXABAN 5 MG PO TABS
5.0000 mg | ORAL_TABLET | Freq: Two times a day (BID) | ORAL | Status: DC
  Administered 2018-02-11 – 2018-02-13 (×5): 5 mg via ORAL
  Filled 2018-02-10 (×5): qty 1

## 2018-02-10 MED ORDER — SODIUM CHLORIDE 0.9 % IV BOLUS
1000.0000 mL | Freq: Once | INTRAVENOUS | Status: AC
  Administered 2018-02-10: 1000 mL via INTRAVENOUS

## 2018-02-10 MED ORDER — SENNOSIDES-DOCUSATE SODIUM 8.6-50 MG PO TABS: 2.0000 | ORAL_TABLET | Freq: Once | ORAL | Status: DC

## 2018-02-10 MED ORDER — BENZONATATE 100 MG PO CAPS
100.0000 mg | ORAL_CAPSULE | Freq: Three times a day (TID) | ORAL | Status: DC | PRN
  Administered 2018-02-10: 100 mg via ORAL
  Administered 2018-02-11: 200 mg via ORAL
  Filled 2018-02-10: qty 2
  Filled 2018-02-10: qty 1

## 2018-02-10 MED ORDER — DILTIAZEM HCL ER COATED BEADS 240 MG PO CP24
240.0000 mg | ORAL_CAPSULE | Freq: Every day | ORAL | Status: DC
  Administered 2018-02-11 – 2018-02-13 (×3): 240 mg via ORAL
  Filled 2018-02-10 (×3): qty 1

## 2018-02-10 MED ORDER — PANTOPRAZOLE SODIUM 40 MG PO TBEC
40.0000 mg | DELAYED_RELEASE_TABLET | Freq: Every day | ORAL | Status: DC
  Administered 2018-02-11 – 2018-02-13 (×3): 40 mg via ORAL
  Filled 2018-02-10 (×3): qty 1

## 2018-02-10 MED ORDER — SODIUM CHLORIDE 0.9 % IV SOLN
1.0000 g | INTRAVENOUS | Status: DC
  Administered 2018-02-10 – 2018-02-12 (×3): 1 g via INTRAVENOUS
  Filled 2018-02-10 (×3): qty 1

## 2018-02-10 MED ORDER — AZITHROMYCIN 250 MG PO TABS
250.0000 mg | ORAL_TABLET | Freq: Every day | ORAL | Status: DC
  Administered 2018-02-11 – 2018-02-13 (×3): 250 mg via ORAL
  Filled 2018-02-10 (×3): qty 1

## 2018-02-10 MED ORDER — SPIRONOLACTONE 25 MG PO TABS
100.0000 mg | ORAL_TABLET | Freq: Every morning | ORAL | Status: DC
  Administered 2018-02-11 – 2018-02-13 (×3): 100 mg via ORAL
  Filled 2018-02-10 (×3): qty 4

## 2018-02-10 MED ORDER — KCL IN DEXTROSE-NACL 20-5-0.45 MEQ/L-%-% IV SOLN
INTRAVENOUS | Status: DC
  Administered 2018-02-10: 22:00:00 via INTRAVENOUS
  Filled 2018-02-10 (×2): qty 1000

## 2018-02-10 MED ORDER — SODIUM CHLORIDE 0.9 % IV SOLN
500.0000 mg | Freq: Once | INTRAVENOUS | Status: AC
  Administered 2018-02-10: 500 mg via INTRAVENOUS
  Filled 2018-02-10: qty 500

## 2018-02-10 MED ORDER — MIRABEGRON ER 25 MG PO TB24
25.0000 mg | ORAL_TABLET | ORAL | Status: DC
  Administered 2018-02-11 – 2018-02-13 (×2): 25 mg via ORAL
  Filled 2018-02-10 (×2): qty 1

## 2018-02-10 MED ORDER — ALBUTEROL SULFATE (2.5 MG/3ML) 0.083% IN NEBU
5.0000 mg | INHALATION_SOLUTION | Freq: Once | RESPIRATORY_TRACT | Status: AC
  Administered 2018-02-10: 5 mg via RESPIRATORY_TRACT
  Filled 2018-02-10: qty 6

## 2018-02-10 NOTE — ED Triage Notes (Signed)
Pt reports that she was diagnosed with double pneumonia recently and has taken one dose of the augmentin prescribed. She is experiencing worsen SOB, especially with taking or exertion. SOB and O2 improved when pt is not speaking. Endorses a fever last night that she treated with ibuprofen. A&Ox4. Ambulatory.

## 2018-02-10 NOTE — H&P (Addendum)
History and Physical    [name] PNT:[id number] DOB: 23-Nov-1950 DOA: 02/10/2018  PCP: Lucretia Kern, DO  Patient coming from: home   Chief Complaint: cough and fatigue  HPI: [name] is a 67 y.o. female with medical history significant for paroxysmal a-fib who presents with above.  Symptoms began about 5 days ago. Started with dry cough and fatigue. Also subjective fever and chills. Cough is now somewhat productive. No chest pain or palpitations. No abdominal pain. No nausea/vomiting; tolerating fluids. Went to an urgent care yesterday, diagnosed with pneumonia, started on augmentin and doxycycline. Presented to ED today because continues to feel poorly and now has dyspnea on exertion, gets winded walking up a flight of steps. No new LE edema; is able to lie flat at night. Takes methenamine for chronic UTIs; otherwise no recent antibiotics. Non-smoker  ED Course: azithromycin, fluids, labs, cxr  Review of Systems: As per HPI otherwise 10 point review of systems negative.    Past Medical History:  Diagnosis Date  . Arthritis   . Atrial fibrillation ([place])   . Constipation   . Difficult intubation    pt states that her neck needs to be in a neutral position,  scoliosis  . Family history of adverse reaction to anesthesia    nausea  . GERD (gastroesophageal reflux disease)    protonix, hx h. pylori  . History of uterine cancer 04/22/2014   sees Dr. Ronita Hipps; s/p complete hysterectomy  . Hx of colonic polyp   . 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 Melanoma [facility])    sees Dr. Renda Rolls in dermatology  . 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 Peripheral vascular disease ([place])   . PONV (postoperative nausea and vomiting)   . Recurrent UTI   . Recurrent UTI   . S/P hip replacement   . Scoliosis   . Thyroid nodule   . Urinary incontinence   . Venous insufficiency    s/p ablation      Past Surgical History:  Procedure Laterality Date  . ABDOMINAL HYSTERECTOMY  09/10/2013  . BUNIONECTOMY  10/1976  . COLONOSCOPY    . FRACTURE SURGERY  09/1964  . MELANOMA EXCISION  12/2011  . TOTAL HIP ARTHROPLASTY Left 06/17/2014   Procedure: LEFT TOTAL HIP ARTHROPLASTY ANTERIOR APPROACH;  Surgeon: Mcarthur Rossetti, MD;  Location: [place];  Service: Orthopedics;  Laterality: Left;  . TOTAL HIP ARTHROPLASTY Right 09/02/2014   Procedure: RIGHT TOTAL HIP ARTHROPLASTY ANTERIOR APPROACH;  Surgeon: Mcarthur Rossetti, MD;  Location: [place];  Service: Orthopedics;  Laterality: Right;  . VEIN SURGERY  05/2011   venous ablation      reports that she has never smoked. She has never used smokeless tobacco. She reports that she drinks alcohol. She reports that she does not use drugs.  Allergies  Allergen Reactions  . Hydrolyzed Silk Hives and Other (See Comments)    Silk tape-blisters  . Ciprofloxacin     Reactions with antiarrythmic  . Gluten Meal Other (See Comments)    reflux    Family History  Problem Relation Age of Onset  . Uterine cancer Mother   . Diabetes Brother 23  . Hypertension Father 36  . Hyperlipidemia Father   . Cancer Father 73  . Epilepsy Father 46  . Arthritis Sister 68  . Cancer Maternal Grandmother   . Stroke Paternal Grandfather   . Arthritis Sister  Prior to Admission medications   Medication Sig Start Date End Date Taking? Authorizing Provider  albuterol (PROVENTIL HFA;VENTOLIN HFA) 108 (90 Base) MCG/ACT inhaler Inhale 1-2 puffs into the lungs every 6 (six) hours as needed for wheezing or shortness of breath.  02/09/18  Yes [provider]  amoxicillin-clavulanate (AUGMENTIN XR) 1000-62.5 MG 12 hr tablet Take 1 tablet by mouth 2 (two) times daily. 02/09/18  Yes [provider]  apixaban (ELIQUIS) 5 MG TABS tablet Take 1 tablet (5 mg total) by mouth 2 (two) times daily. BLOOD WORK NEEDED PRIOR TO NEXT REFILL AUTHORIZATION. 05/12/17   Yes Hilty, Nadean Corwin, MD  benzonatate (TESSALON) 100 MG capsule Take 100-200 mg by mouth every 8 (eight) hours as needed for cough.  02/09/18  Yes [provider]  Cranberry 500 MG TABS Take 1,000 mg by mouth 2 (two) times daily.   Yes [provider]  cyclobenzaprine (FLEXERIL) 5 MG tablet Take 1 tablet (5 mg total) by mouth 3 (three) times daily as needed for muscle spasms. 08/17/17  Yes Lucretia Kern, DO  diltiazem (CARDIZEM) 60 MG tablet Take 60 mg daily if needed for palpitations 06/22/16  Yes Lyda Jester M, PA-C  diltiazem (TIAZAC) 240 MG 24 hr capsule TAKE 1 CAPSULE BY MOUTH EVERY DAY Patient taking differently: Take 240 mg by mouth daily after breakfast.  06/20/17  Yes Hilty, Nadean Corwin, MD  flecainide (TAMBOCOR) 100 MG tablet TAKE 1 TABLET(100 MG) BY MOUTH TWICE DAILY Patient taking differently: Take 100 mg by mouth 2 (two) times daily.  07/17/17  Yes Hilty, Nadean Corwin, MD  methenamine (MANDELAMINE) 0.5 GM tablet Take 500 mg by mouth 2 (two) times daily.    Yes [provider]  mirabegron ER (MYRBETRIQ) 50 MG TB24 tablet Take 25 mg by mouth every other day.    Yes [provider]  pantoprazole (PROTONIX) 40 MG tablet TAKE 1 TABLET(40 MG) BY MOUTH DAILY Patient taking differently: Take 40 mg by mouth daily.  09/05/17  Yes Colin Benton R, DO  spironolactone (ALDACTONE) 50 MG tablet TAKE 2 TABLETS BY MOUTH EVERY MORNING, THEN TAKE 1 TABLET EVERY EVENING Patient taking differently: Take 50-100 mg by mouth See admin instructions. 100 mg every morning, 50 mg every evening 05/18/17  Yes Hilty, Nadean Corwin, MD  triamcinolone cream (KENALOG) 0.1 % Apply 1 application topically as needed (for dry skin).  04/03/14  Yes [provider]  doxycycline (VIBRA-TABS) 100 MG tablet Take 100 mg by mouth 2 (two) times daily. 02/09/18   [provider]    Physical Exam: Vitals:   02/10/18 1622 02/10/18 1839 02/10/18 1912  BP: 138/73 111/78 111/78  Pulse: 62 (!)  136 (!) 130  Resp: 20 20 (!) 24  Temp: 99.5 F (37.5 C)    TempSrc: Oral    SpO2: 91% 92% 93%    Constitutional: No acute distress Head: Atraumatic Eyes: Conjunctiva clear ENM: Moist mucous membranes. Normal dentition.  Neck: Supple Respiratory: rhonchi throughout, faint scattered expiratory wheeze Cardiovascular: tachycardic, irreg irregular Abdomen: Non-tender, non-distended. No masses. No rebound or guarding. Positive bowel sounds. Musculoskeletal: No joint deformity upper and lower extremities. Normal ROM, no contractures. Normal muscle tone.  Skin: hyperpigmentation around ankles Extremities: pitting edema LEs to mid-calf mild. Palpable peripheral pulses. Neurologic: Alert, moving all 4 extremities. Psychiatric: Normal insight and judgement.   Labs on Admission: I have personally reviewed following labs and imaging studies  CBC: Recent Labs  Lab 02/10/18 1758  WBC 11.3*  HGB  13.7  HCT 39.8  MCV 99.7  PLT 540   Basic Metabolic Panel: Recent Labs  Lab 02/10/18 1758  NA 133*  K 3.2*  CL 97*  CO2 23  GLUCOSE 142*  BUN 20  CREATININE 0.91  CALCIUM 8.4*   GFR: CrCl cannot be calculated (Unknown ideal weight.). Liver Function Tests: Recent Labs  Lab 02/10/18 1758  AST 42*  ALT 63*  ALKPHOS 78  BILITOT 0.6  PROT 7.2  ALBUMIN 3.7   No results for input(s): LIPASE, AMYLASE in the last 168 hours. No results for input(s): AMMONIA in the last 168 hours. Coagulation Profile: No results for input(s): INR, PROTIME in the last 168 hours. Cardiac Enzymes: No results for input(s): CKTOTAL, CKMB, CKMBINDEX, TROPONINI in the last 168 hours. BNP (last 3 results) No results for input(s): PROBNP in the last 8760 hours. HbA1C: No results for input(s): HGBA1C in the last 72 hours. CBG: No results for input(s): GLUCAP in the last 168 hours. Lipid Profile: No results for input(s): CHOL, HDL, LDLCALC, TRIG, CHOLHDL, LDLDIRECT in the last 72 hours. Thyroid Function  Tests: No results for input(s): TSH, T4TOTAL, FREET4, T3FREE, THYROIDAB in the last 72 hours. Anemia Panel: No results for input(s): VITAMINB12, FOLATE, FERRITIN, TIBC, IRON, RETICCTPCT in the last 72 hours. Urine analysis:    Component Value Date/Time   BILIRUBINUR neg 07/14/2015 1144   PROTEINUR neg 07/14/2015 1144   UROBILINOGEN 0.2 07/14/2015 1144   NITRITE pos 07/14/2015 1144   LEUKOCYTESUR small (1+) (A) 07/14/2015 1144    Radiological Exams on Admission: Dg Chest 2 View  Result Date: 02/10/2018 CLINICAL DATA:  Worsening shortness of breath. EXAM: CHEST - 2 VIEW COMPARISON:  04/17/2017 FINDINGS: Lungs are adequately inflated with patchy airspace opacification over the mid to lower lungs likely multifocal pneumonia. No effusion. Cardiomediastinal silhouette and remainder the exam is unchanged. IMPRESSION: Bibasilar patchy airspace process likely multifocal pneumonia. Electronically Signed   By: Marin Olp M.D.   On: 02/10/2018 17:14    EKG: Independently reviewed. A-fib with rvr  Assessment/Plan Active Problems:   Atrial fibrillation (HCC)   Chronic venous insufficiency   OAB (overactive bladder)   Long term current use of antiarrhythmic drug   Community acquired pneumonia   CAP (community acquired pneumonia)   Hypokalemia   # Community acquired pneumonia - dx at urgent care yesterday with pneumonia; here with pneumonia symptoms and CXR showing patchy bibasilar process suspicious for multifocal pneumonia. WBCs elevated, in a-fib with intermittent RVR. bnp mildly elevated but no orthopnea or change in baseline swelling, do not thing CHF is a primary driver here. Low concern for ACS given 5 days of symptoms and negative troponin. Borderline hypoxia with O2 91 per ED off oxygen, now 96% on 2 L. Initial lactate elevated. - admit - ceftriaxone/azithromycin - f/u strep pneumo and legionella urinary antigens - f/u rapid flu - o2, wean as able - repeat lacate in AM  # Atrial  fibrillation - hx paroxysmal a-fib, here in a-fib with runs of RVR. Hemodynamically stable. RVR likely 2/2 above process.  - giving evening home meds - s/p 1 L in ED, will order maintenance fluids as well - o2 as above - treating pneumonia as above - tele - iv dilt gtt if hr does not stabilize - continue home meds (flecainide, diltiazem, eliquis)  # Hypokalemia - k 3.2, likely 2/2 decreased po 2/2 illness - K w/ IV fluids - f/u MG - AM CMP  # Mild transaminitis - mild dehydration  could contribute; may also have baseline condition such as fatty liver - repeat cmp in AM; further w/u if remains elevated despite adequate rehydration  DVT prophylaxis: eliquis Code Status: full  Family Communication: daughter Rosiland Oz [phone]  Disposition Plan: tbd  Consults called: none  Admission status: tele    Desma Maxim MD [facility] Pager [phone]  If 7PM-7AM, please contact night-coverage [email or web address] Password [email or web address]  02/10/2018, 7:22 PM

## 2018-02-10 NOTE — ED Notes (Signed)
Pt stable at time of transport, daughter accompanied pt.  Per pt and daughter, pt already took her nighttime home medications after getting permission from ER MD.

## 2018-02-10 NOTE — ED Provider Notes (Signed)
[facility] Emergency Department Provider Note MRN:  [id number]  Arrival date & time: 02/11/18     Chief Complaint   Shortness of Breath   History of Present Illness   [name] is a 67 y.o. year-old female with a history of A. fib, peripheral vascular disease presenting to the ED with chief complaint of shortness of breath.   5 days ago patient began having dry cough, which became productive.  Next day began having gradual onset shortness of breath.  Has also been experiencing body aches.  Continued to feel general malaise and fatigue, unable to do holiday activities with her grandchildren.  Was evaluated at an urgent care yesterday, diagnosed with pneumonia.  Was prescribed Augmentin, took first dose today.  Feeling worse today, was evaluated at PCPs office, where she was hypoxic to 88%, sent here for evaluation.  Patient denies chest pain, no abdominal pain, no dysuria.  Review of Systems  A complete 10 system review of systems was obtained and all systems are negative except as noted in the HPI and PMH.   Patient's Health History    Past Medical History:  Diagnosis Date  . Arthritis   . Atrial fibrillation ([place])   . Constipation   . Difficult intubation    pt states that her neck needs to be in a neutral position,  scoliosis  . Family history of adverse reaction to anesthesia    nausea  . GERD (gastroesophageal reflux disease)    protonix, hx h. pylori  . History of uterine cancer 04/22/2014   sees Dr. Ronita Hipps; s/p complete hysterectomy  . Hx of colonic polyp   . 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 Melanoma [facility])    sees Dr. Renda Rolls in dermatology  . 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 Peripheral vascular disease ([place])   . PONV (postoperative nausea and vomiting)   . Recurrent UTI   . Recurrent UTI   . S/P hip replacement   . Scoliosis   . Thyroid  nodule   . Urinary incontinence   . Venous insufficiency    s/p ablation    Past Surgical History:  Procedure Laterality Date  . ABDOMINAL HYSTERECTOMY  09/10/2013  . BUNIONECTOMY  10/1976  . COLONOSCOPY    . FRACTURE SURGERY  09/1964  . MELANOMA EXCISION  12/2011  . TOTAL HIP ARTHROPLASTY Left 06/17/2014   Procedure: LEFT TOTAL HIP ARTHROPLASTY ANTERIOR APPROACH;  Surgeon: Mcarthur Rossetti, MD;  Location: [place];  Service: Orthopedics;  Laterality: Left;  . TOTAL HIP ARTHROPLASTY Right 09/02/2014   Procedure: RIGHT TOTAL HIP ARTHROPLASTY ANTERIOR APPROACH;  Surgeon: Mcarthur Rossetti, MD;  Location: [place];  Service: Orthopedics;  Laterality: Right;  . VEIN SURGERY  05/2011   venous ablation     Family History  Problem Relation Age of Onset  . Uterine cancer Mother   . Diabetes Brother 20  . Hypertension Father 73  . Hyperlipidemia Father   . Cancer Father 29  . Epilepsy Father 34  . Arthritis Sister 8  . Cancer Maternal Grandmother   . Stroke Paternal Grandfather   . Arthritis Sister     Social History   Socioeconomic History  . Marital status: Divorced    Spouse name: Not on file  . Number of children: 2  . Years of education: JD  . Highest education level: Not on file  Occupational History  .  Occupation: Chief Executive Officer  Social Needs  . Financial resource strain: Not on file  . Food insecurity:    Worry: Not on file    Inability: Not on file  . Transportation needs:    Medical: Not on file    Non-medical: Not on file  Tobacco Use  . Smoking status: Never Smoker  . Smokeless tobacco: Never Used  Substance and Sexual Activity  . Alcohol use: Yes    Alcohol/week: 0.0 standard drinks    Comment: rarely at [facility]   . Drug use: No  . Sexual activity: Not on file  Lifestyle  . Physical activity:    Days per week: Not on file    Minutes per session: Not on file  . Stress: Not on file  Relationships  . Social connections:    Talks on phone: Not on file     Gets together: Not on file    Attends religious service: Not on file    Active member of club or organization: Not on file    Attends meetings of clubs or organizations: Not on file    Relationship status: Not on file  . Intimate partner violence:    Fear of current or ex partner: Not on file    Emotionally abused: Not on file    Physically abused: Not on file    Forced sexual activity: Not on file  Other Topics Concern  . Not on file  Social History Narrative   Work or School: retired Forensic psychologist      Home Situation: lives alone - takes care of twin grandchildren 7 months in 05/2015      Spiritual Beliefs:       Lifestyle: active, healthy diet        Physical Exam  Vital Signs and Nursing Notes reviewed Vitals:   02/10/18 2000 02/10/18 2031  BP:  129/73  Pulse: (!) 120 (!) 128  Resp: (!) 22 18  Temp:  98.4 F (36.9 C)  SpO2: 93% 94%    CONSTITUTIONAL: Well-appearing, NAD NEURO:  Alert and oriented x 3, no focal deficits EYES:  eyes equal and reactive ENT/NECK:  no LAD, no JVD CARDIO: Regular rate, well-perfused, normal S1 and S2 PULM: Crackles and rhonchi, bilateral bases; tachypneic, can only speak 8 words at a time GI/GU:  normal bowel sounds, non-distended, non-tender MSK/SPINE:  No gross deformities, no edema SKIN:  no rash, atraumatic PSYCH:  Appropriate speech and behavior  Diagnostic and Interventional Summary    EKG Interpretation  Date/Time:  Saturday February 10 2018 16:49:37 EST Ventricular Rate:  129 PR Interval:    QRS Duration: 103 QT Interval:  301 QTC Calculation: 443 R Axis:   63 Text Interpretation:  Atrial fibrillation Borderline T abnormalities, lateral leads Artifact in lead(s) I II III aVR aVL aVF Confirmed by Veryl Speak [phone]) on 02/10/2018 5:24:11 PM      Labs Reviewed  CBC - Abnormal; Notable for the following components:      Result Value   WBC 11.3 (*)    MCH 34.3 (*)    All other components within normal limits    COMPREHENSIVE METABOLIC PANEL - Abnormal; Notable for the following components:   Sodium 133 (*)    Potassium 3.2 (*)    Chloride 97 (*)    Glucose, Bld 142 (*)    Calcium 8.4 (*)    AST 42 (*)    ALT 63 (*)    All other components within normal limits  URINALYSIS, ROUTINE  W REFLEX MICROSCOPIC - Abnormal; Notable for the following components:   Color, Urine AMBER (*)    APPearance HAZY (*)    Glucose, UA 50 (*)    Ketones, ur 5 (*)    Protein, ur 100 (*)    Leukocytes, UA LARGE (*)    Bacteria, UA FEW (*)    Non Squamous Epithelial 0-5 (*)    All other components within normal limits  BRAIN NATRIURETIC PEPTIDE - Abnormal; Notable for the following components:   B Natriuretic Peptide 207.0 (*)    All other components within normal limits  I-STAT CG4 LACTIC ACID, ED - Abnormal; Notable for the following components:   Lactic Acid, Venous 2.02 (*)    All other components within normal limits  MAGNESIUM  INFLUENZA PANEL BY PCR (TYPE A & B)  LEGIONELLA PNEUMOPHILA SEROGP 1 UR AG  STREP PNEUMONIAE URINARY ANTIGEN  HIV ANTIBODY (ROUTINE TESTING W REFLEX)  COMPREHENSIVE METABOLIC PANEL  LACTIC ACID, PLASMA  I-STAT TROPONIN, ED    DG Chest 2 View  Final Result      Medications  diltiazem (CARDIZEM CD) 24 hr capsule 240 mg (has no administration in time range)  flecainide (TAMBOCOR) tablet 100 mg (has no administration in time range)  spironolactone (ALDACTONE) tablet 100 mg (has no administration in time range)  spironolactone (ALDACTONE) tablet 50 mg (has no administration in time range)  pantoprazole (PROTONIX) EC tablet 40 mg (40 mg Oral Not Given 02/10/18 2219)  mirabegron ER (MYRBETRIQ) tablet 25 mg (has no administration in time range)  apixaban (ELIQUIS) tablet 5 mg (5 mg Oral Not Given 02/10/18 2217)  cyclobenzaprine (FLEXERIL) tablet 5 mg (has no administration in time range)  benzonatate (TESSALON) capsule 100-200 mg (100 mg Oral Given 02/10/18 2131)  dextrose 5 %  and 0.45 % NaCl with KCl 20 mEq/L infusion ( Intravenous New Bag/Given 02/10/18 2140)  cefTRIAXone (ROCEPHIN) 1 g in sodium chloride 0.9 % 100 mL IVPB (1 g Intravenous New Bag/Given 02/10/18 2139)  azithromycin (ZITHROMAX) tablet 250 mg (has no administration in time range)  senna-docusate (Senokot-S) tablet 2 tablet (has no administration in time range)  albuterol (PROVENTIL) (2.5 MG/3ML) 0.083% nebulizer solution 5 mg (5 mg Nebulization Given 02/10/18 1647)  sodium chloride 0.9 % bolus 1,000 mL (1,000 mLs Intravenous New Bag/Given 02/10/18 1803)  acetaminophen (TYLENOL) tablet 650 mg (650 mg Oral Given 02/10/18 1807)  azithromycin (ZITHROMAX) 500 mg in sodium chloride 0.9 % 250 mL IVPB (0 mg Intravenous Stopped 02/10/18 1913)  chlorpheniramine-HYDROcodone (TUSSIONEX) 10-8 MG/5ML suspension 5 mL (5 mLs Oral Given 02/11/18 0014)     Procedures Critical Care Critical Care Documentation Critical care time provided by me (excluding procedures): 35 minutes  Condition necessitating critical care: Hypoxic respiratory failure, multifocal pneumonia  Components of critical care management: reviewing of prior records, laboratory and imaging interpretation, frequent re-examination and reassessment of vital signs, administration of supplemental oxygen, IV antibiotics, IV fluid resuscitation, discussion with consulting services.    ED Course and Medical Decision Making  I have reviewed the triage vital signs and the nursing notes.  Pertinent labs & imaging results that were available during my care of the patient were reviewed by me and considered in my medical decision making (see below for details).  Concern for worsening pneumonia in this 67 year old female.  Also considering new onset CHF, though this is less likely.  Influenza also possible given body aches.  Gradual onset shortness of breath, on Xarelto at baseline for A. fib, no evidence  of DVT on exam, low concern for PE.  Currently satting only  91% on room air, no prior history of COPD or asthma.  Anticipating admission.  Received dose of Augmentin today at noon.  Admitted to hospitalist service for further care and evaluation.  Barth Kirks. Sedonia Small, MD [place] [email or web address]  Final Clinical Impressions(s) / ED Diagnoses     ICD-10-CM   1. Multifocal pneumonia J18.9   2. Hypoxia R09.02 DG Chest 2 View    DG Chest 2 View    ED Discharge Orders    None         Maudie Flakes, MD 02/11/18 [phone]

## 2018-02-10 NOTE — ED Notes (Signed)
I Stat Lactic: 2.02 RN and Provider notified

## 2018-02-10 NOTE — ED Notes (Signed)
Pt placed on 2L [place]

## 2018-02-11 LAB — COMPREHENSIVE METABOLIC PANEL
ALT: 53 U/L — ABNORMAL HIGH (ref 0–44)
AST: 36 U/L (ref 15–41)
Albumin: 3.1 g/dL — ABNORMAL LOW (ref 3.5–5.0)
Alkaline Phosphatase: 66 U/L (ref 38–126)
Anion gap: 9 (ref 5–15)
BUN: 12 mg/dL (ref 8–23)
CO2: 23 mmol/L (ref 22–32)
Calcium: 8 mg/dL — ABNORMAL LOW (ref 8.9–10.3)
Chloride: 102 mmol/L (ref 98–111)
Creatinine, Ser: 0.54 mg/dL (ref 0.44–1.00)
GFR calc Af Amer: >60 mL/min (ref >60)
GFR calc non Af Amer: >60 mL/min (ref >60)
Glucose, Bld: 112 mg/dL — ABNORMAL HIGH (ref 70–99)
Potassium: 3.4 mmol/L — ABNORMAL LOW (ref 3.5–5.1)
Sodium: 134 mmol/L — ABNORMAL LOW (ref 135–145)
Total Bilirubin: 0.6 mg/dL (ref 0.3–1.2)
Total Protein: 6.3 g/dL — ABNORMAL LOW (ref 6.5–8.1)

## 2018-02-11 LAB — STREP PNEUMONIAE URINARY ANTIGEN: Strep Pneumo Urinary Antigen: NEGATIVE

## 2018-02-11 LAB — MRSA PCR SCREENING: MRSA by PCR: NEGATIVE

## 2018-02-11 LAB — LACTIC ACID, PLASMA: Lactic Acid, Venous: 1.3 mmol/L (ref 0.5–1.9)

## 2018-02-11 LAB — HIV ANTIBODY (ROUTINE TESTING W REFLEX): HIV Screen 4th Generation wRfx: NONREACTIVE

## 2018-02-11 MED ORDER — GUAIFENESIN ER 600 MG PO TB12
600.0000 mg | ORAL_TABLET | Freq: Two times a day (BID) | ORAL | Status: DC
  Administered 2018-02-11 – 2018-02-13 (×5): 600 mg via ORAL
  Filled 2018-02-11 (×5): qty 1

## 2018-02-11 MED ORDER — LEVALBUTEROL HCL 0.63 MG/3ML IN NEBU
INHALATION_SOLUTION | RESPIRATORY_TRACT | Status: AC
  Filled 2018-02-11: qty 3

## 2018-02-11 MED ORDER — SENNOSIDES-DOCUSATE SODIUM 8.6-50 MG PO TABS
1.0000 | ORAL_TABLET | Freq: Two times a day (BID) | ORAL | Status: DC
  Administered 2018-02-11 – 2018-02-13 (×5): 1 via ORAL
  Filled 2018-02-11 (×5): qty 1

## 2018-02-11 MED ORDER — LEVALBUTEROL HCL 0.63 MG/3ML IN NEBU
0.6300 mg | INHALATION_SOLUTION | Freq: Four times a day (QID) | RESPIRATORY_TRACT | Status: DC | PRN
  Administered 2018-02-11: 0.63 mg via RESPIRATORY_TRACT
  Filled 2018-02-11: qty 3

## 2018-02-11 MED ORDER — POTASSIUM CHLORIDE CRYS ER 20 MEQ PO TBCR
40.0000 meq | EXTENDED_RELEASE_TABLET | Freq: Once | ORAL | Status: AC
  Administered 2018-02-11: 40 meq via ORAL
  Filled 2018-02-11: qty 2

## 2018-02-11 MED ORDER — ALBUTEROL SULFATE (2.5 MG/3ML) 0.083% IN NEBU
2.5000 mg | INHALATION_SOLUTION | RESPIRATORY_TRACT | Status: DC | PRN
  Filled 2018-02-11: qty 3

## 2018-02-11 MED ORDER — SODIUM CHLORIDE 0.9 % IV SOLN
INTRAVENOUS | Status: DC
  Administered 2018-02-11: 16:00:00 via INTRAVENOUS

## 2018-02-11 MED ORDER — LEVALBUTEROL HCL 1.25 MG/0.5ML IN NEBU: 1.2500 mg | INHALATION_SOLUTION | Freq: Three times a day (TID) | RESPIRATORY_TRACT | Status: DC

## 2018-02-11 MED ORDER — IPRATROPIUM BROMIDE 0.02 % IN SOLN: 0.5000 mg | Freq: Three times a day (TID) | RESPIRATORY_TRACT | Status: DC

## 2018-02-11 MED ORDER — POLYETHYLENE GLYCOL 3350 17 G PO PACK
17.0000 g | PACK | Freq: Every day | ORAL | Status: DC
  Administered 2018-02-11 – 2018-02-13 (×3): 17 g via ORAL
  Filled 2018-02-11 (×3): qty 1

## 2018-02-11 MED ORDER — LEVALBUTEROL HCL 1.25 MG/0.5ML IN NEBU
1.2500 mg | INHALATION_SOLUTION | Freq: Three times a day (TID) | RESPIRATORY_TRACT | Status: DC
  Administered 2018-02-11 – 2018-02-12 (×3): 1.25 mg via RESPIRATORY_TRACT
  Filled 2018-02-11 (×3): qty 0.5

## 2018-02-11 MED ORDER — HYDROCOD POLST-CPM POLST ER 10-8 MG/5ML PO SUER
5.0000 mL | Freq: Two times a day (BID) | ORAL | Status: DC | PRN
  Administered 2018-02-11 – 2018-02-12 (×2): 5 mL via ORAL
  Filled 2018-02-11 (×2): qty 5

## 2018-02-11 MED ORDER — ALBUTEROL SULFATE (2.5 MG/3ML) 0.083% IN NEBU: 2.5000 mg | INHALATION_SOLUTION | RESPIRATORY_TRACT | Status: DC | PRN

## 2018-02-11 MED ORDER — IPRATROPIUM BROMIDE 0.02 % IN SOLN
0.5000 mg | Freq: Three times a day (TID) | RESPIRATORY_TRACT | Status: DC
  Administered 2018-02-11 – 2018-02-12 (×3): 0.5 mg via RESPIRATORY_TRACT
  Filled 2018-02-11 (×3): qty 2.5

## 2018-02-11 NOTE — Progress Notes (Signed)
PROGRESS NOTE  [name] HDQ:[id number] DOB: 1950-07-21 DOA: 02/10/2018 PCP: Lucretia Kern, DO  HPI/Recap of past 24 hours:  Dry cough, denies chest pain, no fever Daughter at bedside  Assessment/Plan: Active Problems:   Atrial fibrillation ([place])   Chronic venous insufficiency   OAB (overactive bladder)   Long term current use of antiarrhythmic drug   Community acquired pneumonia   CAP (community acquired pneumonia)   Hypokalemia  pna /acute hypoxia/sepsis -WBC 11.3 , lactic acid 2.2, tachycardia, tachypnea, on presentation -cxr "Bibasilar patchy airspace process likely multifocal pneumonia." -Negative flu, urine strep pneumo antigen negative -Continue have wheezing, cough, will add on scheduled nebulizer, Mucinex, abx with rocephin and zithro  lft elevation -will check hepatitis panel, she reports rarely drink alcohol -will discuss with patient regarding liver US -repeat lft in am  Afib/RVR  -Likely due to stress, continue Cardizem, flecanide -keep K>4, mag>2, monitor renal function -continue eliquis  Mild hyponatremia: -likely from dehydration, continue iv (change ivf from d51/2saline to ns)  Hypokalemia: -replace k  Constipation No bm for 5 days Start stool softener   H/o uterine cancer s/p hysterectomy in 2016 H/o melanoma, followed by dermatology  H/o recurrent UTI on  Methenamine/cranberry tabs   Code Status: full  Family Communication: patient and daughter  Disposition Plan: home in 1-2 days   Consultants:  none  Procedures:  none  Antibiotics:  Rocephin and zithromax   Objective: BP 116/70 (BP Location: Right Arm)   Pulse (!) 120   Temp 99 F (37.2 C) (Oral)   Resp 18   Ht 5\' 9"  (1.753 m)   Wt 107.4 kg   SpO2 96%   BMI 34.97 kg/m   Intake/Output Summary (Last 24 hours) at 02/11/2018 1110 Last data filed at 02/11/2018 1194 Gross per 24 hour  Intake 291.72 ml  Output 1000 ml  Net -708.28 ml   Filed Weights   02/11/18 0211  Weight: 107.4 kg    Exam: Patient is examined daily including today on 02/11/2018, exams remain the same as of yesterday except that has changed    General:  NAD  Cardiovascular: iRRR  Respiratory: course, wheezing  Abdomen: Soft/ND/NT, positive BS  Musculoskeletal: No Edema  Neuro: alert, oriented   Data Reviewed: Basic Metabolic Panel: Recent Labs  Lab 02/10/18 1758 02/11/18 0533  NA 133* 134*  K 3.2* 3.4*  CL 97* 102  CO2 23 23  GLUCOSE 142* 112*  BUN 20 12  CREATININE 0.91 0.54  CALCIUM 8.4* 8.0*  MG 2.0  --    Liver Function Tests: Recent Labs  Lab 02/10/18 1758 02/11/18 0533  AST 42* 36  ALT 63* 53*  ALKPHOS 78 66  BILITOT 0.6 0.6  PROT 7.2 6.3*  ALBUMIN 3.7 3.1*   No results for input(s): LIPASE, AMYLASE in the last 168 hours. No results for input(s): AMMONIA in the last 168 hours. CBC: Recent Labs  Lab 02/10/18 1758  WBC 11.3*  HGB 13.7  HCT 39.8  MCV 99.7  PLT 211   Cardiac Enzymes:   No results for input(s): CKTOTAL, CKMB, CKMBINDEX, TROPONINI in the last 168 hours. BNP (last 3 results) Recent Labs    02/10/18 1759  BNP 207.0*    ProBNP (last 3 results) No results for input(s): PROBNP in the last 8760 hours.  CBG: No results for input(s): GLUCAP in the last 168 hours.  No results found for this or any previous visit (from the past 240 hour(s)).   Studies: Dg  Chest 2 View  Result Date: 02/10/2018 CLINICAL DATA:  Worsening shortness of breath. EXAM: CHEST - 2 VIEW COMPARISON:  04/17/2017 FINDINGS: Lungs are adequately inflated with patchy airspace opacification over the mid to lower lungs likely multifocal pneumonia. No effusion. Cardiomediastinal silhouette and remainder the exam is unchanged. IMPRESSION: Bibasilar patchy airspace process likely multifocal pneumonia. Electronically Signed   By: Marin Olp M.D.   On: 02/10/2018 17:14    Scheduled Meds: . apixaban  5 mg Oral BID  . azithromycin  250 mg Oral  Daily  . diltiazem  240 mg Oral QPC breakfast  . flecainide  100 mg Oral BID  . guaiFENesin  600 mg Oral BID  . mirabegron ER  25 mg Oral QODAY  . pantoprazole  40 mg Oral Daily  . polyethylene glycol  17 g Oral Daily  . senna-docusate  1 tablet Oral BID  . senna-docusate  2 tablet Oral Once  . spironolactone  100 mg Oral q morning - 10a  . spironolactone  50 mg Oral QPM    Continuous Infusions: . cefTRIAXone (ROCEPHIN)  IV 1 g (02/10/18 2139)  . dextrose 5 % and 0.45 % NaCl with KCl 20 mEq/L 100 mL/hr at 02/10/18 2140     Time spent: 75mins I have personally reviewed and interpreted on  02/11/2018 daily labs, tele strips, imagings as discussed above under date review session and assessment and plans.  I reviewed all nursing notes, pharmacy notes, consultant notes,  vitals, pertinent old records  I have discussed plan of care as described above with RN , patient and family on 02/11/2018   Florencia Reasons MD, PhD  [facility] Pager [phone]. If 7PM-7AM, please contact night-coverage at [email or web address], password [facility] 02/11/2018, 11:10 AM  LOS: 1 day

## 2018-02-12 ENCOUNTER — Inpatient Hospital Stay (HOSPITAL_COMMUNITY): Payer: PPO

## 2018-02-12 LAB — COMPREHENSIVE METABOLIC PANEL
ALBUMIN: 3 g/dL — AB (ref 3.5–5.0)
ALT: 60 U/L — ABNORMAL HIGH (ref 0–44)
AST: 41 U/L (ref 15–41)
Alkaline Phosphatase: 64 U/L (ref 38–126)
Anion gap: 12 (ref 5–15)
BUN: 13 mg/dL (ref 8–23)
CO2: 20 mmol/L — ABNORMAL LOW (ref 22–32)
Calcium: 8.5 mg/dL — ABNORMAL LOW (ref 8.9–10.3)
Chloride: 106 mmol/L (ref 98–111)
Creatinine, Ser: 0.61 mg/dL (ref 0.44–1.00)
GFR calc Af Amer: >60 mL/min (ref >60)
GFR calc non Af Amer: >60 mL/min (ref >60)
Glucose, Bld: 102 mg/dL — ABNORMAL HIGH (ref 70–99)
Potassium: 4 mmol/L (ref 3.5–5.1)
Sodium: 138 mmol/L (ref 135–145)
Total Bilirubin: 0.5 mg/dL (ref 0.3–1.2)
Total Protein: 6.3 g/dL — ABNORMAL LOW (ref 6.5–8.1)

## 2018-02-12 LAB — CBC WITH DIFFERENTIAL/PLATELET
Abs Immature Granulocytes: 0.07 10*3/uL (ref 0.00–0.07)
Basophils Absolute: 0 10*3/uL (ref 0.0–0.1)
Basophils Relative: 1 %
EOS ABS: 0.1 10*3/uL (ref 0.0–0.5)
Eosinophils Relative: 2 %
HCT: 38.2 % (ref 36.0–46.0)
Hemoglobin: 12.6 g/dL (ref 12.0–15.0)
Immature Granulocytes: 1 %
Lymphocytes Relative: 28 %
Lymphs Abs: 1.7 10*3/uL (ref 0.7–4.0)
MCH: 33.8 pg (ref 26.0–34.0)
MCHC: 33 g/dL (ref 30.0–36.0)
MCV: 102.4 fL — ABNORMAL HIGH (ref 80.0–100.0)
Monocytes Absolute: 1 10*3/uL (ref 0.1–1.0)
Monocytes Relative: 17 %
NRBC: 0 % (ref 0.0–0.2)
Neutro Abs: 3.1 10*3/uL (ref 1.7–7.7)
Neutrophils Relative %: 51 %
Platelets: 260 10*3/uL (ref 150–400)
RBC: 3.73 MIL/uL — ABNORMAL LOW (ref 3.87–5.11)
RDW: 12.9 % (ref 11.5–15.5)
WBC: 6 10*3/uL (ref 4.0–10.5)

## 2018-02-12 LAB — EXPECTORATED SPUTUM ASSESSMENT W GRAM STAIN, RFLX TO RESP C

## 2018-02-12 LAB — LEGIONELLA PNEUMOPHILA SEROGP 1 UR AG: L. pneumophila Serogp 1 Ur Ag: NEGATIVE

## 2018-02-12 LAB — MAGNESIUM: Magnesium: 2.1 mg/dL (ref 1.7–2.4)

## 2018-02-12 MED ORDER — PREDNISONE 50 MG PO TABS
50.0000 mg | ORAL_TABLET | Freq: Once | ORAL | Status: AC
  Administered 2018-02-12: 50 mg via ORAL
  Filled 2018-02-12: qty 1

## 2018-02-12 MED ORDER — IPRATROPIUM BROMIDE 0.02 % IN SOLN
0.5000 mg | Freq: Four times a day (QID) | RESPIRATORY_TRACT | Status: DC
  Administered 2018-02-12 – 2018-02-13 (×4): 0.5 mg via RESPIRATORY_TRACT
  Filled 2018-02-12 (×4): qty 2.5

## 2018-02-12 MED ORDER — DILTIAZEM HCL 30 MG PO TABS
30.0000 mg | ORAL_TABLET | Freq: Once | ORAL | Status: AC
  Administered 2018-02-12: 30 mg via ORAL
  Filled 2018-02-12: qty 1

## 2018-02-12 MED ORDER — PREDNISONE 20 MG PO TABS
40.0000 mg | ORAL_TABLET | Freq: Every day | ORAL | Status: DC
  Administered 2018-02-13: 40 mg via ORAL
  Filled 2018-02-12: qty 2

## 2018-02-12 MED ORDER — LEVALBUTEROL HCL 1.25 MG/0.5ML IN NEBU
1.2500 mg | INHALATION_SOLUTION | Freq: Four times a day (QID) | RESPIRATORY_TRACT | Status: DC
  Administered 2018-02-12 – 2018-02-13 (×4): 1.25 mg via RESPIRATORY_TRACT
  Filled 2018-02-12 (×4): qty 0.5

## 2018-02-12 NOTE — Progress Notes (Signed)
The patient's 12-lead EKG read Atrial Fibrillation with Rapid Ventricular Response.Rate 112. PCP was notified

## 2018-02-12 NOTE — Plan of Care (Signed)
  Problem: Pain Managment: Goal: General experience of comfort will improve Outcome: Progressing   

## 2018-02-12 NOTE — Progress Notes (Signed)
PROGRESS NOTE  [name] IRJ:[id number] DOB: 02-22-1951 DOA: 02/10/2018 PCP: Lucretia Kern, DO  HPI/Recap of past 24 hours:  Reports feeling a little better, less cough, less wheezing denies chest pain, no fever Legs start to be a little puffy Intermittent afib/rvr  Assessment/Plan: Active Problems:   Atrial fibrillation ([facility])   Chronic venous insufficiency   OAB (overactive bladder)   Long term current use of antiarrhythmic drug   Community acquired pneumonia   CAP (community acquired pneumonia)   Hypokalemia  pna /acute hypoxia/sepsis -WBC 11.3 , lactic acid 2.2, tachycardia, tachypnea, on presentation -cxr "Bibasilar patchy airspace process likely multifocal pneumonia." -Negative flu, urine strep pneumo antigen negative -persistent wheezing/ cough,increase nebulizer, add prednisone, continue Mucinex, abx with rocephin and zithro  lft elevation -will check hepatitis panel, she reports rarely drink alcohol -check liver US -repeat lft in am  Afib/RVR  -Likely due to stress, continue Cardizem, flecanide -keep K>4, mag>2, monitor renal function -continue eliquis Has intermittent aifb/rvr, requiring prn dose of oral cardizem  Mild hyponatremia: -likely from dehydration, resolved with hydration  Hypokalemia: -k replaced and normalized  Constipation No bm for 5 days Start stool softener   H/o uterine cancer s/p hysterectomy in 2016 H/o melanoma, followed by dermatology  H/o recurrent UTI on  Methenamine/cranberry tabs   Code Status: full  Family Communication: patient and daughter  Disposition Plan: home in 1-2 days   Consultants:  none  Procedures:  none  Antibiotics:  Rocephin and zithromax   Objective: BP (!) 123/92 (BP Location: Left Arm)   Pulse (!) 112   Temp (!) 97.5 F (36.4 C) (Oral)   Resp 16   Ht 5\' 9"  (1.753 m)   Wt 107.4 kg   SpO2 94% Comment: Pt has a cont. pulse ox  BMI 34.97 kg/m   Intake/Output Summary (Last 24  hours) at 02/12/2018 1043 Last data filed at 02/12/2018 0856 Gross per 24 hour  Intake 2751.25 ml  Output 2750 ml  Net 1.25 ml   Filed Weights   02/11/18 0211  Weight: 107.4 kg    Exam: Patient is examined daily including today on 02/12/2018, exams remain the same as of yesterday except that has changed    General:  NAD  Cardiovascular: iRRR  Respiratory: course, wheezing  Abdomen: Soft/ND/NT, positive BS  Musculoskeletal: No Edema  Neuro: alert, oriented   Data Reviewed: Basic Metabolic Panel: Recent Labs  Lab 02/10/18 1758 02/11/18 0533 02/12/18 0541  NA 133* 134* 138  K 3.2* 3.4* 4.0  CL 97* 102 106  CO2 23 23 20*  GLUCOSE 142* 112* 102*  BUN 20 12 13   CREATININE 0.91 0.54 0.61  CALCIUM 8.4* 8.0* 8.5*  MG 2.0  --  2.1   Liver Function Tests: Recent Labs  Lab 02/10/18 1758 02/11/18 0533 02/12/18 0541  AST 42* 36 41  ALT 63* 53* 60*  ALKPHOS 78 66 64  BILITOT 0.6 0.6 0.5  PROT 7.2 6.3* 6.3*  ALBUMIN 3.7 3.1* 3.0*   No results for input(s): LIPASE, AMYLASE in the last 168 hours. No results for input(s): AMMONIA in the last 168 hours. CBC: Recent Labs  Lab 02/10/18 1758 02/12/18 0541  WBC 11.3* 6.0  NEUTROABS  --  3.1  HGB 13.7 12.6  HCT 39.8 38.2  MCV 99.7 102.4*  PLT 211 260   Cardiac Enzymes:   No results for input(s): CKTOTAL, CKMB, CKMBINDEX, TROPONINI in the last 168 hours. BNP (last 3 results) Recent Labs  02/10/18 1759  BNP 207.0*    ProBNP (last 3 results) No results for input(s): PROBNP in the last 8760 hours.  CBG: No results for input(s): GLUCAP in the last 168 hours.  Recent Results (from the past 240 hour(s))  MRSA PCR Screening     Status: None   Collection Time: 02/11/18  3:10 PM  Result Value Ref Range Status   MRSA by PCR NEGATIVE NEGATIVE Final    Comment:        The GeneXpert MRSA Assay (FDA approved for NASAL specimens only), is one component of a comprehensive MRSA colonization surveillance program.  It is not intended to diagnose MRSA infection nor to guide or monitor treatment for MRSA infections. Performed at [facility], [place] [address] [place], [place] [address]   Expectorated sputum assessment w rflx to resp cult     Status: None   Collection Time: 02/12/18 12:04 AM  Result Value Ref Range Status   Specimen Description SPUTUM  Final   Special Requests NONE  Final   Sputum evaluation   Final    THIS SPECIMEN IS ACCEPTABLE FOR SPUTUM CULTURE Performed at [facility], [place] [address], [place], [place] [address]    Report Status 02/12/2018 FINAL  Final  Culture, respiratory     Status: None (Preliminary result)   Collection Time: 02/12/18 12:04 AM  Result Value Ref Range Status   Specimen Description   Final    SPUTUM Performed at [place] [address], [place], [place] [address]    Special Requests   Final    NONE Reflexed from [id number] Performed at [facility], [place] [address], [place], [place] [address]    Gram Stain   Final    RARE WBC PRESENT,BOTH PMN AND MONONUCLEAR RARE GRAM POSITIVE COCCI Performed at [facility], [place] [address], [place], [place] [address]    Culture PENDING  Incomplete   Report Status PENDING  Incomplete     Studies: No results found.  Scheduled Meds: . apixaban  5 mg Oral BID  . azithromycin  250 mg Oral Daily  . diltiazem  240 mg Oral QPC breakfast  . flecainide  100 mg Oral BID  . guaiFENesin  600 mg Oral BID  . ipratropium  0.5 mg Nebulization QID  . levalbuterol  1.25 mg Nebulization QID  . mirabegron ER  25 mg Oral QODAY  . pantoprazole  40 mg Oral Daily  . polyethylene glycol  17 g Oral Daily  . senna-docusate  1 tablet Oral BID  . spironolactone  100 mg Oral q morning - 10a  . spironolactone  50 mg Oral QPM    Continuous Infusions: . sodium chloride 75 mL/hr at 02/11/18 1600  . cefTRIAXone (ROCEPHIN)  IV 1 g (02/11/18 2229)      Time spent: 24mins I have personally reviewed and interpreted on  02/12/2018 daily labs, tele strips, imagings as discussed above under date review session and assessment and plans.  I reviewed all nursing notes, pharmacy notes,  vitals, pertinent old records  I have discussed plan of care as described above with RN , patient  on 02/12/2018   Florencia Reasons MD, PhD  [facility] Pager [phone]. If 7PM-7AM, please contact night-coverage at [email or web address], password [facility] 02/12/2018, 10:43 AM  LOS: 2 days

## 2018-02-13 ENCOUNTER — Telehealth: Payer: Self-pay | Admitting: *Deleted

## 2018-02-13 LAB — CBC WITH DIFFERENTIAL/PLATELET
Abs Immature Granulocytes: 0.14 10*3/uL — ABNORMAL HIGH (ref 0.00–0.07)
Basophils Absolute: 0 10*3/uL (ref 0.0–0.1)
Basophils Relative: 0 %
Eosinophils Absolute: 0 10*3/uL (ref 0.0–0.5)
Eosinophils Relative: 0 %
HCT: 37.7 % (ref 36.0–46.0)
Hemoglobin: 12.4 g/dL (ref 12.0–15.0)
Immature Granulocytes: 2 %
LYMPHS PCT: 13 %
Lymphs Abs: 1.2 10*3/uL (ref 0.7–4.0)
MCH: 34.3 pg — ABNORMAL HIGH (ref 26.0–34.0)
MCHC: 32.9 g/dL (ref 30.0–36.0)
MCV: 104.1 fL — ABNORMAL HIGH (ref 80.0–100.0)
Monocytes Absolute: 0.9 10*3/uL (ref 0.1–1.0)
Monocytes Relative: 10 %
Neutro Abs: 7.1 10*3/uL (ref 1.7–7.7)
Neutrophils Relative %: 75 %
Platelets: 278 10*3/uL (ref 150–400)
RBC: 3.62 MIL/uL — AB (ref 3.87–5.11)
RDW: 12.9 % (ref 11.5–15.5)
WBC: 9.4 10*3/uL (ref 4.0–10.5)
nRBC: 0 % (ref 0.0–0.2)

## 2018-02-13 LAB — VITAMIN B12: Vitamin B-12: 1207 pg/mL — ABNORMAL HIGH (ref 180–914)

## 2018-02-13 LAB — COMPREHENSIVE METABOLIC PANEL
ALT: 51 U/L — AB (ref 0–44)
ANION GAP: 8 (ref 5–15)
AST: 28 U/L (ref 15–41)
Albumin: 3.4 g/dL — ABNORMAL LOW (ref 3.5–5.0)
Alkaline Phosphatase: 65 U/L (ref 38–126)
BUN: 14 mg/dL (ref 8–23)
CALCIUM: 9 mg/dL (ref 8.9–10.3)
CO2: 25 mmol/L (ref 22–32)
CREATININE: 0.46 mg/dL (ref 0.44–1.00)
Chloride: 105 mmol/L (ref 98–111)
GFR calc non Af Amer: >60 mL/min (ref >60)
Glucose, Bld: 107 mg/dL — ABNORMAL HIGH (ref 70–99)
Potassium: 4.4 mmol/L (ref 3.5–5.1)
Sodium: 138 mmol/L (ref 135–145)
Total Bilirubin: 0.3 mg/dL (ref 0.3–1.2)
Total Protein: 6.6 g/dL (ref 6.5–8.1)

## 2018-02-13 LAB — HEPATITIS PANEL, ACUTE
Hep A IgM: NEGATIVE
Hep B C IgM: NEGATIVE
Hepatitis B Surface Ag: NEGATIVE

## 2018-02-13 LAB — FOLATE: Folate: 6.1 ng/mL (ref >5.9)

## 2018-02-13 MED ORDER — CEFPODOXIME PROXETIL 200 MG PO TABS: 200.0000 mg | ORAL_TABLET | Freq: Two times a day (BID) | ORAL | Status: DC

## 2018-02-13 MED ORDER — CEFPODOXIME PROXETIL 200 MG PO TABS: 200.0000 mg | ORAL_TABLET | Freq: Two times a day (BID) | ORAL | 0 refills | Status: AC

## 2018-02-13 MED ORDER — HYDROCOD POLST-CPM POLST ER 10-8 MG/5ML PO SUER: 5.0000 mL | Freq: Two times a day (BID) | ORAL | 0 refills | Status: DC | PRN

## 2018-02-13 MED ORDER — SENNOSIDES-DOCUSATE SODIUM 8.6-50 MG PO TABS: 1.0000 | ORAL_TABLET | Freq: Every day | ORAL | 0 refills | Status: DC

## 2018-02-13 MED ORDER — PREDNISONE 20 MG PO TABS: 40.0000 mg | ORAL_TABLET | Freq: Every day | ORAL | 0 refills | Status: AC

## 2018-02-13 MED ORDER — GUAIFENESIN ER 600 MG PO TB12: 600.0000 mg | ORAL_TABLET | Freq: Two times a day (BID) | ORAL | 0 refills | Status: DC

## 2018-02-13 MED ORDER — LEVALBUTEROL HCL 1.25 MG/0.5ML IN NEBU: 1.2500 mg | INHALATION_SOLUTION | Freq: Two times a day (BID) | RESPIRATORY_TRACT | Status: DC

## 2018-02-13 MED ORDER — POLYETHYLENE GLYCOL 3350 17 G PO PACK: 17.0000 g | PACK | Freq: Every day | ORAL | 0 refills | Status: DC

## 2018-02-13 MED ORDER — IPRATROPIUM BROMIDE 0.02 % IN SOLN: 0.5000 mg | Freq: Two times a day (BID) | RESPIRATORY_TRACT | Status: DC

## 2018-02-13 NOTE — Telephone Encounter (Signed)
  02/15/2018-patient is already on the schedule at 9:45am.  I blocked 7am spot. Wendie Simmer     Copied from [place] [phone]. Topic: General - Other >> Feb 13, 2018  2:10 PM Oneta Rack wrote: Patient discharged from [facility] today for pneumonia as per discharge instruction follow up with PCP in 1 week, Dr. Maudie Mercury has no hospital follow up in 1 week, please advise. Best # to reach patient [phone] (M)

## 2018-02-13 NOTE — Progress Notes (Signed)
Patient verbalized understanding of discharge orders. Patient is stable at discharge.

## 2018-02-13 NOTE — Progress Notes (Signed)
SATURATION QUALIFICATIONS: (This note is used to comply with regulatory documentation for home oxygen)  Patient Saturations on Room Air at Rest = 94%  Patient Saturations on Room Air while Ambulating = 88-92%  Patient Saturations on none Liters of oxygen while Ambulating = no oxygen  Please briefly explain why patient needs home oxygen: No oxygen needed per Attending Provider. Acute illness.

## 2018-02-13 NOTE — Discharge Summary (Signed)
Discharge Summary  [name] TKZ:[id number] DOB: [birth year]  PCP: Lucretia Kern, DO  Admit date: 02/10/2018 Discharge date: 02/13/2018  Time spent: 45mins,   Recommendations for Outpatient Follow-up:  1. F/u with PCP within a week  for hospital discharge follow up, repeat cbc/cmp at follow up. pcp to follow up on final sputum culture result, pcp to monitor liver function  Discharge Diagnoses:  Active Hospital Problems   Diagnosis Date Noted  . Community acquired pneumonia 02/10/2018  . CAP (community acquired pneumonia) 02/10/2018  . Hypokalemia 02/10/2018  . Long term current use of antiarrhythmic drug 11/02/2015  . OAB (overactive bladder) 09/07/2015  . Chronic venous insufficiency 05/25/2015  . Atrial fibrillation ([place]) 04/22/2014    Resolved Hospital Problems  No resolved problems to display.    Discharge Condition: stable  Diet recommendation: heart healthy  Filed Weights   02/11/18 0211  Weight: 107.4 kg    History of present illness: (per admitting MD Dr Si Raider) PCP: Lucretia Kern, DO  Patient coming from: home   Chief Complaint: cough and fatigue  HPI: [name] is a 67 y.o. female with medical history significant for paroxysmal a-fib who presents with above.  Symptoms began about 5 days ago. Started with dry cough and fatigue. Also subjective fever and chills. Cough is now somewhat productive. No chest pain or palpitations. No abdominal pain. No nausea/vomiting; tolerating fluids. Went to an urgent care yesterday, diagnosed with pneumonia, started on augmentin and doxycycline. Presented to ED today because continues to feel poorly and now has dyspnea on exertion, gets winded walking up a flight of steps. No new LE edema; is able to lie flat at night. Takes methenamine for chronic UTIs; otherwise no recent antibiotics. Non-smoker  ED Course: azithromycin, fluids, labs, cxr  Hospital Course:  Active Problems:   Atrial fibrillation (HCC)  Chronic venous insufficiency   OAB (overactive bladder)   Long term current use of antiarrhythmic drug   Community acquired pneumonia   CAP (community acquired pneumonia)   Hypokalemia   pna /acute hypoxia/sepsis -WBC 11.3 , lactic acid 2.2, tachycardia, tachypnea, on presentation -cxr "Bibasilar patchy airspace process likely multifocal pneumonia." -Negative flu, urine strep pneumo antigen negative , mrsa screening negative -she received nebulizer, prednisone, Mucinex, abx with rocephin and zithro -wheezing has resolved, cough has improved, wbc normalized, sputum culture in process -Discharge on  on prednsione/vantin/mucinex F/u with pcp  lft elevation -hepatitis panel negtive, she reports rarely drink alcohol -liver US "Mild amount of gallbladder sludge. No other abnormality seen in the right upper quadrant of the abdomen. -pcp to monitor  lft   Afib/RVR  -Likely due to stress, continue Cardizem, flecanide -keep K>4, mag>2, monitor renal function -continue eliquis Has intermittent aifb/rvr, requiring prn dose of oral cardizem Heart rate controlled at discharge  Mild hyponatremia: -likely from dehydration, resolved with hydration  Hypokalemia: -k replaced and normalized  Constipation No bm for 5 days Start stool softener   H/o uterine cancer s/p hysterectomy in 2016 H/o melanoma, followed by dermatology  H/o recurrent UTI on  Methenamine/cranberry tabs   Code Status: full  Family Communication: patient   Disposition Plan: home    Consultants:  none  Procedures:  none  Antibiotics:  Rocephin and zithromax   Discharge Exam: BP 133/75 (BP Location: Right Arm)   Pulse 79   Temp 97.6 F (36.4 C) (Oral)   Resp 20   Ht 5\' 9"  (1.753 m)   Wt 107.4 kg   SpO2 95%  BMI 34.97 kg/m   General: NAD Cardiovascular: IRRR Respiratory: CTABL  Discharge Instructions You were cared for by a hospitalist during your hospital stay. If you have  any questions about your discharge medications or the care you received while you were in the hospital after you are discharged, you can call the unit and asked to speak with the hospitalist on call if the hospitalist that took care of you is not available. Once you are discharged, your primary care physician will handle any further medical issues. Please note that NO REFILLS for any discharge medications will be authorized once you are discharged, as it is imperative that you return to your primary care physician (or establish a relationship with a primary care physician if you do not have one) for your aftercare needs so that they can reassess your need for medications and monitor your lab values.  Discharge Instructions    Diet - low sodium heart healthy   Complete by:  As directed    Increase activity slowly   Complete by:  As directed      Allergies as of 02/13/2018      Reactions   Hydrolyzed Silk Hives, Other (See Comments)   Silk tape-blisters   Ciprofloxacin    Reactions with antiarrythmic   Gluten Meal Other (See Comments)   reflux      Medication List    STOP taking these medications   amoxicillin-clavulanate 1000-62.5 MG 12 hr tablet Commonly known as:  AUGMENTIN XR   doxycycline 100 MG tablet Commonly known as:  VIBRA-TABS     TAKE these medications   albuterol 108 (90 Base) MCG/ACT inhaler Commonly known as:  PROVENTIL HFA;VENTOLIN HFA Inhale 1-2 puffs into the lungs every 6 (six) hours as needed for wheezing or shortness of breath.   apixaban 5 MG Tabs tablet Commonly known as:  ELIQUIS Take 1 tablet (5 mg total) by mouth 2 (two) times daily. BLOOD WORK NEEDED PRIOR TO NEXT REFILL AUTHORIZATION.   benzonatate 100 MG capsule Commonly known as:  TESSALON Take 100-200 mg by mouth every 8 (eight) hours as needed for cough.   cefpodoxime 200 MG tablet Commonly known as:  VANTIN Take 1 tablet (200 mg total) by mouth every 12 (twelve) hours for 3 days.     chlorpheniramine-HYDROcodone 10-8 MG/5ML Suer Commonly known as:  TUSSIONEX Take 5 mLs by mouth every 12 (twelve) hours as needed for cough.   Cranberry 500 MG Tabs Take 1,000 mg by mouth 2 (two) times daily.   cyclobenzaprine 5 MG tablet Commonly known as:  FLEXERIL Take 1 tablet (5 mg total) by mouth 3 (three) times daily as needed for muscle spasms.   diltiazem 240 MG 24 hr capsule Commonly known as:  TIAZAC TAKE 1 CAPSULE BY MOUTH EVERY DAY What changed:    how much to take  when to take this   diltiazem 60 MG tablet Commonly known as:  CARDIZEM Take 60 mg daily if needed for palpitations   flecainide 100 MG tablet Commonly known as:  TAMBOCOR TAKE 1 TABLET(100 MG) BY MOUTH TWICE DAILY What changed:  See the new instructions.   guaiFENesin 600 MG 12 hr tablet Commonly known as:  MUCINEX Take 1 tablet (600 mg total) by mouth 2 (two) times daily.   methenamine 0.5 GM tablet Commonly known as:  MANDELAMINE Take 500 mg by mouth 2 (two) times daily.   MYRBETRIQ 50 MG Tb24 tablet Generic drug:  mirabegron ER Take 25 mg by mouth every  other day.   pantoprazole 40 MG tablet Commonly known as:  PROTONIX TAKE 1 TABLET(40 MG) BY MOUTH DAILY What changed:  See the new instructions.   polyethylene glycol packet Commonly known as:  MIRALAX / GLYCOLAX Take 17 g by mouth daily. Start taking on:  02/14/2018   predniSONE 20 MG tablet Commonly known as:  DELTASONE Take 2 tablets (40 mg total) by mouth daily with breakfast for 3 days. Start taking on:  02/14/2018   senna-docusate 8.6-50 MG tablet Commonly known as:  Senokot-S Take 1 tablet by mouth at bedtime.   spironolactone 50 MG tablet Commonly known as:  ALDACTONE TAKE 2 TABLETS BY MOUTH EVERY MORNING, THEN TAKE 1 TABLET EVERY EVENING What changed:  See the new instructions.   triamcinolone cream 0.1 % Commonly known as:  KENALOG Apply 1 application topically as needed (for dry skin).      Allergies   Allergen Reactions  . Hydrolyzed Silk Hives and Other (See Comments)    Silk tape-blisters  . Ciprofloxacin     Reactions with antiarrythmic  . Gluten Meal Other (See Comments)    reflux   Follow-up Information    Lucretia Kern, DO Follow up in 1 week(s).   Specialty:  Family Medicine Why:  hospital discharge follow up, repeat cbc/cmp at follow up. pcp to follow up on final sputum culture result. pcp to monitor liver function. Contact information: [place] [address] [phone]            The results of significant diagnostics from this hospitalization (including imaging, microbiology, ancillary and laboratory) are listed below for reference.    Significant Diagnostic Studies: Dg Chest 2 View  Result Date: 02/10/2018 CLINICAL DATA:  Worsening shortness of breath. EXAM: CHEST - 2 VIEW COMPARISON:  04/17/2017 FINDINGS: Lungs are adequately inflated with patchy airspace opacification over the mid to lower lungs likely multifocal pneumonia. No effusion. Cardiomediastinal silhouette and remainder the exam is unchanged. IMPRESSION: Bibasilar patchy airspace process likely multifocal pneumonia. Electronically Signed   By: Marin Olp M.D.   On: 02/10/2018 17:14   US Abdomen Limited  Result Date: 02/12/2018 CLINICAL DATA:  Elevated liver function tests. EXAM: ULTRASOUND ABDOMEN LIMITED RIGHT UPPER QUADRANT COMPARISON:  None. FINDINGS: Gallbladder: No gallstones or wall thickening visualized. No sonographic Murphy sign noted by sonographer. Mild amount of sludge is noted within gallbladder lumen. Common bile duct: Diameter: 4 mm which is within normal limits. Liver: No focal lesion identified. Within normal limits in parenchymal echogenicity. Portal vein is patent on color Doppler imaging with normal direction of blood flow towards the liver. IMPRESSION: Mild amount of gallbladder sludge. No other abnormality seen in the right upper quadrant of the abdomen.  Electronically Signed   By: Marijo Conception, M.D.   On: 02/12/2018 20:53    Microbiology: Recent Results (from the past 240 hour(s))  MRSA PCR Screening     Status: None   Collection Time: 02/11/18  3:10 PM  Result Value Ref Range Status   MRSA by PCR NEGATIVE NEGATIVE Final    Comment:        The GeneXpert MRSA Assay (FDA approved for NASAL specimens only), is one component of a comprehensive MRSA colonization surveillance program. It is not intended to diagnose MRSA infection nor to guide or monitor treatment for MRSA infections. Performed at [facility], [place] [address], [place], [place] [address]   Expectorated sputum assessment w rflx to resp cult  Status: None   Collection Time: 02/12/18 12:04 AM  Result Value Ref Range Status   Specimen Description SPUTUM  Final   Special Requests NONE  Final   Sputum evaluation   Final    THIS SPECIMEN IS ACCEPTABLE FOR SPUTUM CULTURE Performed at [facility], [place] [address] [place], [place] [address]    Report Status 02/12/2018 FINAL  Final  Culture, respiratory     Status: None (Preliminary result)   Collection Time: 02/12/18 12:04 AM  Result Value Ref Range Status   Specimen Description   Final    SPUTUM Performed at [place] [address], [place], [place] [address]    Special Requests   Final    NONE Reflexed from [id number] Performed at [facility], [place] [address] [place], [place] [address]    Gram Stain   Final    RARE WBC PRESENT,BOTH PMN AND MONONUCLEAR RARE GRAM POSITIVE COCCI    Culture   Final    CULTURE REINCUBATED FOR BETTER GROWTH Performed at [facility], [place] [address], [place],  [address]    Report Status PENDING  Incomplete     Labs: Basic Metabolic Panel: Recent Labs  Lab 02/10/18 1758 02/11/18 0533 02/12/18 0541 02/13/18 0539  NA 133* 134* 138 138  K 3.2* 3.4* 4.0 4.4  CL 97* 102 106 105  CO2  23 23 20* 25  GLUCOSE 142* 112* 102* 107*  BUN 20 12 13 14   CREATININE 0.91 0.54 0.61 0.46  CALCIUM 8.4* 8.0* 8.5* 9.0  MG 2.0  --  2.1  --    Liver Function Tests: Recent Labs  Lab 02/10/18 1758 02/11/18 0533 02/12/18 0541 02/13/18 0539  AST 42* 36 41 28  ALT 63* 53* 60* 51*  ALKPHOS 78 66 64 65  BILITOT 0.6 0.6 0.5 0.3  PROT 7.2 6.3* 6.3* 6.6  ALBUMIN 3.7 3.1* 3.0* 3.4*   No results for input(s): LIPASE, AMYLASE in the last 168 hours. No results for input(s): AMMONIA in the last 168 hours. CBC: Recent Labs  Lab 02/10/18 1758 02/12/18 0541 02/13/18 0539  WBC 11.3* 6.0 9.4  NEUTROABS  --  3.1 7.1  HGB 13.7 12.6 12.4  HCT 39.8 38.2 37.7  MCV 99.7 102.4* 104.1*  PLT 211 260 278   Cardiac Enzymes: No results for input(s): CKTOTAL, CKMB, CKMBINDEX, TROPONINI in the last 168 hours. BNP: BNP (last 3 results) Recent Labs    02/10/18 1759  BNP 207.0*    ProBNP (last 3 results) No results for input(s): PROBNP in the last 8760 hours.  CBG: No results for input(s): GLUCAP in the last 168 hours.     Signed:  Florencia Reasons MD, PhD  [facility] 02/13/2018, 10:44 PM

## 2018-02-13 NOTE — Care Management Important Message (Signed)
Important Message  Patient Details  Name: [name] MRN: [id number] Date of Birth: [birth year]   Medicare Important Message Given:  Yes    Kerin Salen 02/13/2018, 12:11 [place] Message  Patient Details  Name: [name] MRN: [id number] Date of Birth: 1950-05-10   Medicare Important Message Given:  Yes    Kerin Salen 02/13/2018, 12:11 PM

## 2018-02-14 LAB — CULTURE, RESPIRATORY W GRAM STAIN: Culture: NORMAL

## 2018-02-14 NOTE — Telephone Encounter (Signed)
Ok to use 9:45 slot next Thursday but BLOCK 7am slot so not too many morning patients. Thanks!

## 2018-02-15 ENCOUNTER — Telehealth: Payer: Self-pay | Admitting: *Deleted

## 2018-02-15 NOTE — Telephone Encounter (Signed)
Transition Care Management Follow-up Telephone Call  Discharge date: 02/13/2018  Time spent: 7mins,   Recommendations for Outpatient Follow-up:  1. F/u with PCP within a week  for hospital discharge follow up, repeat cbc/cmp at follow up. pcp to follow up on final sputum culture result, pcp to monitor liver function   How have you been since you were released from the hospital? Improving every day. Having some SOB which improves with inhaler and rest.   Do you understand why you were in the hospital? yes   Do you understand the discharge instructions? yes   Where were you discharged to? home   Items Reviewed:  Medications reviewed: yes  Allergies reviewed: yes  Dietary changes reviewed: yes  Referrals reviewed: yes   Functional Questionnaire:   Activities of Daily Living (ADLs):   She states they are independent in the following: none States they require assistance with the following: none   Any transportation issues/concerns?: no   Any patient concerns? no   Confirmed importance and date/time of follow-up visits scheduled yes  Provider Appointment booked with Dr Maudie Mercury 02/22/18  Confirmed with patient if condition begins to worsen call PCP or go to the ER.  Patient was given the office number and encouraged to call back with question or concerns.  : yes

## 2018-02-21 NOTE — Progress Notes (Signed)
HPI:  Using dictation device. Unfortunately this device frequently misinterprets words/phrases. Recheck LFTs 1 month, ? Referral for GB sludge  [name] is a pleasant 67 y.o. with a PMH significant for a. Fib and gerd here for a hospital follow up. See transitional care phone note in Epic. Per review of discharge documents and patient: Hospitalized 11/30 - 02/13/18 Primary admitting complaint(s): cough, fatigue, dyspnea Primary admitting diagnosis (es) and treatment: CAP, hypoxia, sepsis --> treated with rocephin, azithro, prednisone and discharged on vantin/prednisone, sputum culture with normal resp flora Other significant diagnosis (es) and treatment: -transaminitis, [place] with gb sludge, mild  - resolving on discharge, hep panel neg -a. Fib with RVR - on cardizem, flecanide, eliquis, sees cardiology for management -hyponatremia/hypokalemia - treated in the hospital, resolved on discharge Follow up concerns per discharge document: Reports today: pneumonia symptoms resolved - no fevers, cough, sob (except with S. Fib); however, for the last 1 week report "a. Fib is acting up". More frequent spells of feeling heart race, accompanied by "squeezing" in the ant lower neck, dyspnea - reports this is always the symptoms she gets when rate is up for many years. She is taking extra diltiazem for this which resolves symptoms. She had an episode last weekend when she stood up from the toilet and fell on her face - unsure if she was LOC briefly. Reports at the time was having heart racing. She has not contacted her cardiologist about this. Today reports feeling well. No palpitations, CP, Dypnea, or symptoms today. Chronic dyspepsia. Reports see GI. No RUQ pain. Sees Dr. Harlow Asa for other issues and prefers referral to him for the GB dz.    ROS: See pertinent positives and negatives per HPI.  Past Medical History:  Diagnosis Date  . Arthritis   . Atrial fibrillation ([place])   . Constipation   .  Difficult intubation    pt states that her neck needs to be in a neutral position,  scoliosis  . Family history of adverse reaction to anesthesia    nausea  . GERD (gastroesophageal reflux disease)    protonix, hx h. pylori  . History of uterine cancer 04/22/2014   sees Dr. Ronita Hipps; s/p complete hysterectomy  . Hx of colonic polyp   . 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 Melanoma [facility])    sees Dr. Renda Rolls in dermatology  . 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 Peripheral vascular disease ([place])   . PONV (postoperative nausea and vomiting)   . Recurrent UTI   . Recurrent UTI   . S/P hip replacement   . Scoliosis   . Thyroid nodule   . Urinary incontinence   . Venous insufficiency    s/p ablation    Past Surgical History:  Procedure Laterality Date  . ABDOMINAL HYSTERECTOMY  09/10/2013  . BUNIONECTOMY  10/1976  . COLONOSCOPY    . FRACTURE SURGERY  09/1964  . MELANOMA EXCISION  12/2011  . TOTAL HIP ARTHROPLASTY Left 06/17/2014   Procedure: LEFT TOTAL HIP ARTHROPLASTY ANTERIOR APPROACH;  Surgeon: Mcarthur Rossetti, MD;  Location: [place];  Service: Orthopedics;  Laterality: Left;  . TOTAL HIP ARTHROPLASTY Right 09/02/2014   Procedure: RIGHT TOTAL HIP ARTHROPLASTY ANTERIOR APPROACH;  Surgeon: Mcarthur Rossetti, MD;  Location: [place] [place];  Service: Orthopedics;  Laterality: Right;  . VEIN SURGERY  05/2011   venous ablation     Family History  Problem Relation Age  of Onset  . Uterine cancer Mother   . Diabetes Brother 62  . Hypertension Father 51  . Hyperlipidemia Father   . Cancer Father 6  . Epilepsy Father 15  . Arthritis Sister 66  . Cancer Maternal Grandmother   . Stroke Paternal Grandfather   . Arthritis Sister     SOCIAL HX: see hpi   Current Outpatient Medications:  .  albuterol (PROVENTIL HFA;VENTOLIN HFA) 108 (90 Base) MCG/ACT inhaler, Inhale 1-2 puffs into the  lungs every 6 (six) hours as needed for wheezing or shortness of breath. , Disp: , Rfl: 0 .  apixaban (ELIQUIS) 5 MG TABS tablet, Take 1 tablet (5 mg total) by mouth 2 (two) times daily. BLOOD WORK NEEDED PRIOR TO NEXT REFILL AUTHORIZATION., Disp: 180 tablet, Rfl: 0 .  chlorpheniramine-HYDROcodone (TUSSIONEX) 10-8 MG/5ML SUER, Take 5 mLs by mouth every 12 (twelve) hours as needed for cough., Disp: 140 mL, Rfl: 0 .  Cranberry 500 MG TABS, Take 1,000 mg by mouth 2 (two) times daily., Disp: , Rfl:  .  cyclobenzaprine (FLEXERIL) 5 MG tablet, Take 1 tablet (5 mg total) by mouth 3 (three) times daily as needed for muscle spasms., Disp: 20 tablet, Rfl: 0 .  diltiazem (CARDIZEM) 60 MG tablet, Take 60 mg daily if needed for palpitations, Disp: 30 tablet, Rfl: 6 .  diltiazem (TIAZAC) 240 MG 24 hr capsule, TAKE 1 CAPSULE BY MOUTH EVERY DAY (Patient taking differently: Take 240 mg by mouth daily after breakfast. ), Disp: 90 capsule, Rfl: 3 .  flecainide (TAMBOCOR) 100 MG tablet, TAKE 1 TABLET(100 MG) BY MOUTH TWICE DAILY (Patient taking differently: Take 100 mg by mouth 2 (two) times daily. ), Disp: 180 tablet, Rfl: 3 .  guaiFENesin (MUCINEX) 600 MG 12 hr tablet, Take 1 tablet (600 mg total) by mouth 2 (two) times daily., Disp: 30 tablet, Rfl: 0 .  methenamine (MANDELAMINE) 0.5 GM tablet, Take 500 mg by mouth 2 (two) times daily. , Disp: , Rfl:  .  mirabegron ER (MYRBETRIQ) 50 MG TB24 tablet, Take 25 mg by mouth every other day. , Disp: , Rfl:  .  pantoprazole (PROTONIX) 40 MG tablet, TAKE 1 TABLET(40 MG) BY MOUTH DAILY (Patient taking differently: Take 40 mg by mouth daily. ), Disp: 90 tablet, Rfl: 0 .  polyethylene glycol (MIRALAX / GLYCOLAX) packet, Take 17 g by mouth daily., Disp: 14 each, Rfl: 0 .  senna-docusate (SENOKOT-S) 8.6-50 MG tablet, Take 1 tablet by mouth at bedtime., Disp: 30 tablet, Rfl: 0 .  spironolactone (ALDACTONE) 50 MG tablet, TAKE 2 TABLETS BY MOUTH EVERY MORNING, THEN TAKE 1 TABLET EVERY  EVENING (Patient taking differently: Take 50-100 mg by mouth See admin instructions. 100 mg every morning, 50 mg every evening), Disp: 270 tablet, Rfl: 3 .  triamcinolone cream (KENALOG) 0.1 %, Apply 1 application topically as needed (for dry skin). , Disp: , Rfl: 1  EXAM:  Vitals:   02/22/18 0941  BP: 120/80  Pulse: 82  Temp: 97.6 F (36.4 C)  SpO2: 98%    Body mass index is 34.87 kg/m.  GENERAL: vitals reviewed and listed above, alert, oriented, appears well hydrated and in no acute distress  HEENT: atraumatic, conjunttiva clear, no obvious abnormalities on inspection of external nose and ears  NECK: no obvious masses on inspection  LUNGS: clear to auscultation bilaterally, no wheezes, rales or rhonchi, good air movement  CV: HRRR, no peripheral edema  MS: moves all extremities without noticeable abnormality  PSYCH: pleasant and  cooperative, no obvious depression or anxiety  ASSESSMENT AND PLAN:  Discussed the following assessment and plan:  Community acquired pneumonia, unspecified laterality -seems to have resolved, asymptomatic  Paroxysmal atrial fibrillation [facility]) -sees cardiology for management, had assistant call their clinic to notify them of pt symptoms, ? Syncopal episode and recs and they scheduled appt for her tomorrow. She agrees to call herself today as well to give them update. Advised prompt eval or call cardiology if any further CP/dypnea/syncope or presyncope or frequent or severe symptoms.  Gall bladder disease -suspect mild LFT elevation more likely from recent illness and medications - recheck in 2-4 weeks -refer to Dr. Harlow Asa per her request for eval and options for management of GB disease  -Patient advised to return or notify a doctor immediately if symptoms worsen or persist or new concerns arise.  Patient Instructions  BEFORE YOU LEAVE: -follow up: see the cardiologist as planned -check liver tests in 1 month - lab appointment -follow up  here in 3-4 months  Seek emergency care or contact your cardiologist if any symptoms with the heart, chest pain, passing out or difficulty breathing in the interim.  I sent a referral to Dr. Gala Lewandowsky office about the gallbladder.    Lucretia Kern, DO

## 2018-02-22 ENCOUNTER — Ambulatory Visit (INDEPENDENT_AMBULATORY_CARE_PROVIDER_SITE_OTHER): Payer: PPO | Admitting: Family Medicine

## 2018-02-22 ENCOUNTER — Encounter

## 2018-02-22 ENCOUNTER — Encounter: Payer: Self-pay | Admitting: Family Medicine

## 2018-02-22 VITALS — BP 120/80 | HR 82 | Temp 97.6°F | Ht 69.0 in | Wt 236.1 lb

## 2018-02-22 DIAGNOSIS — J189 Pneumonia, unspecified organism: Secondary | ICD-10-CM

## 2018-02-22 DIAGNOSIS — I480 Paroxysmal atrial fibrillation: Secondary | ICD-10-CM | POA: Diagnosis not present

## 2018-02-22 DIAGNOSIS — K829 Disease of gallbladder, unspecified: Secondary | ICD-10-CM | POA: Diagnosis not present

## 2018-02-22 DIAGNOSIS — Z8701 Personal history of pneumonia (recurrent): Secondary | ICD-10-CM | POA: Diagnosis not present

## 2018-02-22 NOTE — Patient Instructions (Signed)
BEFORE YOU LEAVE: -follow up: see the cardiologist as planned -check liver tests in 1 month - lab appointment -follow up here in 3-4 months  Seek emergency care or contact your cardiologist if any symptoms with the heart, chest pain, passing out or difficulty breathing in the interim.  I sent a referral to Dr. Gala Lewandowsky office about the gallbladder.

## 2018-02-23 ENCOUNTER — Ambulatory Visit: Payer: PPO | Admitting: Internal Medicine

## 2018-02-27 ENCOUNTER — Encounter: Payer: Self-pay | Admitting: Internal Medicine

## 2018-02-27 ENCOUNTER — Ambulatory Visit (INDEPENDENT_AMBULATORY_CARE_PROVIDER_SITE_OTHER): Payer: PPO | Admitting: Internal Medicine

## 2018-02-27 VITALS — BP 118/74 | HR 70 | Ht 69.0 in | Wt 236.4 lb

## 2018-02-27 DIAGNOSIS — Z79899 Other long term (current) drug therapy: Secondary | ICD-10-CM

## 2018-02-27 DIAGNOSIS — I480 Paroxysmal atrial fibrillation: Secondary | ICD-10-CM | POA: Diagnosis not present

## 2018-02-27 DIAGNOSIS — R55 Syncope and collapse: Secondary | ICD-10-CM | POA: Insufficient documentation

## 2018-02-27 NOTE — Patient Instructions (Signed)
Medication Instructions:  Continue current medications If you need a refill on your cardiac medications before your next appointment, please call your pharmacy.   Follow-Up: At [facility], you and your health needs are our priority.  As part of our continuing mission to provide you with exceptional heart care, we have created designated Provider Care Teams.  These Care Teams include your primary Cardiologist (physician) and Advanced Practice Providers (APPs -  Physician Assistants and Nurse Practitioners) who all work together to provide you with the care you need, when you need it. You will need a follow up appointment in 6 months.  Please call our office 2 months in advance to schedule this appointment.  You may see Dr. Debara Pickett or one of the following Advanced Practice Providers on your designated Care Team: Almyra Deforest, [place] . Fabian Sharp, PA-C  You have been referred to Dr. Thompson Grayer [facility]) [address] - [address]rd Floor   Any Other Special Instructions Will Be Listed Below (If Applicable).

## 2018-02-27 NOTE — Progress Notes (Signed)
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 She is from [place] and was living in the [place] area. Her past medical history is significant for atrial fibrillation. This was first noted in 2012 and she was controlled initially with diltiazem. She was in a hospital in atrial fibrillation and spontaneously converted back to sinus. Subsequently she had some breakthroughs in her dose of diltiazem was increased. Eventually she saw different cardiologist and was placed on flecainide 50 mg twice a day has had only one breakthrough that she is aware of since starting on this medication. This was associated with a UTI which she was given Cipro, which I cautioned her about using in the future due to QT interactions. She also has a history of uterine cancer fortunately and is a survivor. She continues to have problems with bilateral hip osteoarthritis and is scheduled to have left hip replacement in the near future. She denies any chest pain or shortness of breath with exertion. She reports her prior cardiologist in [place] did stress testing within the last 2 years and has done several stress tests and she started with atrial fibrillation. There is no family history of coronary disease although cancer is somewhat rapid in the family.  Mr. Verne is doing fairly well today. She denies any chest pain or shortness of breath. She reports no recurrence of A. fib that she is aware of. She is maintaining normal sinus rhythm and is on flecainide 50 mg twice a day. QTc interval is 432 ms. She is also taking aspirin 650 mg daily.  I had the pleasure seen Kiki back in the office today. She is doing exceedingly well. She had recent hip replacement and is ambulating much better. She reports her pain is improved and she is now off of narcotics. She's had no  further problems with palpitations after increasing her flecainide to 75 mg twice a day. Her EKG remained stable and heart rate is 80 today with a QTC of 454 ms. She tells me that her granddaughter recently had twins that were premature but are doing well and she is enjoying being grandmother.  11/02/2015  [name] returns today for follow-up. Overall she's been doing well. She denies any complaints such as palpitations or fluttering. She reports fairly good swelling on spironolactone which is her current diuretic. Although we typically would use a loop diuretic or thiazide, this is reasonable treatment and she's not had a problem with her kidney function or potassium. QTC today is 460 ms on flecainide with normal sinus rhythm. She is on full dose aspirin as her previous CHADSVASC score was 1, however since she is female and greater than age 67 her CHADSVASC score is now 2. We had a long discussion about her stroke risk, particularly the increased stroke risk for females in this age group. Her stroke risk is about 2.2%. If she were to take aspirin the stroke was actually slightly higher at 2.3% but maybe not statistically different. The bleeding risk is 1.1%. If she were to take Eliquis, her stroke risk is reduced to 0.8% with a bleeding risk of 2.6%.  05/03/2016  [name] returns for day for follow-up. She is doing well without recurrent atrial fibrillation on flecainide. She is on L O'[place] is without any significant bleeding problems. Weight has been stable and  blood pressure is at goal.   04/27/2017  [name] is seen today for annual follow-up.  She had some recurrent A. fib in May 2018 and was seen at the A. fib clinic.  Her flecainide was increased to 100mg  twice daily and she has had resolution of her A. fib.  She seems to be pleased with the control she has at this point.  EKG was performed today shows sinus rhythm first-degree AV block at a rate of 63 with a QTC of 429 ms.  Blood pressure is  well controlled.  She denies any chest pain or worsening shortness of breath.  02/27/2018  [name] was recently admitted in December for multifocal pneumonia and suffered acute onset A. fib with RVR.  She had heart rates into the high 180s, despite being on flecainide and Cardizem.  She required extra Cardizem for rate control and ultimately converted spontaneously back to sinus rhythm.  Since then she has had several other episodes of tachycardia and one episode at home when she was walking to the bathroom which resulted in a syncopal episode.  She apparently fell and landed on the threshold between her bedroom and bathroom causing bilateral ecchymosis of the forehead and under both orbits.  She had one other syncopal episode and that was when she was initially diagnosed with A. fib.  It is unusual that her heart rates are so fast despite being on flecainide and Cardizem.  He is very symptomatic with her A. fib.  He had stress testing and echo last year both of which were reassuring.   PMHx:  Past Medical History:  Diagnosis Date  . Arthritis   . Atrial fibrillation ([place])   . Constipation   . Difficult intubation    pt states that her neck needs to be in a neutral position,  scoliosis  . Family history of adverse reaction to anesthesia    nausea  . GERD (gastroesophageal reflux disease)    protonix, hx h. pylori  . History of uterine cancer 04/22/2014   sees Dr. Ronita Hipps; s/p complete hysterectomy  . Hx of colonic polyp   . 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 Melanoma [facility])    sees Dr. Renda Rolls in dermatology  . 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 Peripheral vascular disease ([place])   . PONV (postoperative nausea and vomiting)   . Recurrent UTI   . Recurrent UTI   . S/P hip replacement   . Scoliosis   . Thyroid nodule   . Urinary incontinence   . Venous insufficiency    s/p ablation      Past Surgical History:  Procedure Laterality Date  . ABDOMINAL HYSTERECTOMY  09/10/2013  . BUNIONECTOMY  10/1976  . COLONOSCOPY    . FRACTURE SURGERY  09/1964  . MELANOMA EXCISION  12/2011  . TOTAL HIP ARTHROPLASTY Left 06/17/2014   Procedure: LEFT TOTAL HIP ARTHROPLASTY ANTERIOR APPROACH;  Surgeon: Mcarthur Rossetti, MD;  Location: [place];  Service: Orthopedics;  Laterality: Left;  . TOTAL HIP ARTHROPLASTY Right 09/02/2014   Procedure: RIGHT TOTAL HIP ARTHROPLASTY ANTERIOR APPROACH;  Surgeon: Mcarthur Rossetti, MD;  Location: [place];  Service: Orthopedics;  Laterality: Right;  . VEIN SURGERY  05/2011   venous ablation     FAMHx:  Family History  Problem Relation Age of Onset  . Uterine cancer Mother   . Diabetes Brother 34  . Hypertension Father 11  .  Hyperlipidemia Father   . Cancer Father 73  . Epilepsy Father 20  . Arthritis Sister 78  . Cancer Maternal Grandmother   . Stroke Paternal Grandfather   . Arthritis Sister     SOCHx:   reports that she has never smoked. She has never used smokeless tobacco. She reports current alcohol use. She reports that she does not use drugs.  ALLERGIES:  Allergies  Allergen Reactions  . Hydrolyzed Silk Hives and Other (See Comments)    Silk tape-blisters  . Ciprofloxacin     Reactions with antiarrythmic  . Gluten Meal Other (See Comments)    reflux    ROS: Pertinent items noted in HPI and remainder of comprehensive ROS otherwise negative.  HOME MEDS: Current Outpatient Medications  Medication Sig Dispense Refill  . apixaban (ELIQUIS) 5 MG TABS tablet Take 1 tablet (5 mg total) by mouth 2 (two) times daily. BLOOD WORK NEEDED PRIOR TO NEXT REFILL AUTHORIZATION. 180 tablet 0  . Cranberry 500 MG TABS Take 1,000 mg by mouth 2 (two) times daily.    . cyclobenzaprine (FLEXERIL) 5 MG tablet Take 1 tablet (5 mg total) by mouth 3 (three) times daily as needed for muscle spasms. 20 tablet 0  . diltiazem (CARDIZEM) 60 MG tablet Take  60 mg daily if needed for palpitations 30 tablet 6  . diltiazem (TIAZAC) 240 MG 24 hr capsule TAKE 1 CAPSULE BY MOUTH EVERY DAY (Patient taking differently: Take 240 mg by mouth daily after breakfast. ) 90 capsule 3  . flecainide (TAMBOCOR) 100 MG tablet TAKE 1 TABLET(100 MG) BY MOUTH TWICE DAILY (Patient taking differently: Take 100 mg by mouth 2 (two) times daily. ) 180 tablet 3  . methenamine (MANDELAMINE) 0.5 GM tablet Take 500 mg by mouth 2 (two) times daily.     . mirabegron ER (MYRBETRIQ) 50 MG TB24 tablet Take 25 mg by mouth every other day.     . pantoprazole (PROTONIX) 40 MG tablet TAKE 1 TABLET(40 MG) BY MOUTH DAILY (Patient taking differently: Take 40 mg by mouth daily. ) 90 tablet 0  . polyethylene glycol (MIRALAX / GLYCOLAX) packet Take 17 g by mouth daily. (Patient taking differently: Take 17 g by mouth as needed. ) 14 each 0  . senna-docusate (SENOKOT-S) 8.6-50 MG tablet Take 1 tablet by mouth at bedtime. (Patient taking differently: Take 1 tablet by mouth at bedtime as needed. ) 30 tablet 0  . spironolactone (ALDACTONE) 50 MG tablet TAKE 2 TABLETS BY MOUTH EVERY MORNING, THEN TAKE 1 TABLET EVERY EVENING (Patient taking differently: Take 50-100 mg by mouth See admin instructions. 100 mg every morning, 50 mg every evening) 270 tablet 3  . triamcinolone cream (KENALOG) 0.1 % Apply 1 application topically as needed (for dry skin).   1   No current facility-administered medications for this visit.     LABS/IMAGING: No results found for this or any previous visit (from the past 48 hour(s)). No results found.  VITALS: BP 118/74   Pulse 70   Ht 5\' 9"  (1.753 m)   Wt 236 lb 6.4 oz (107.2 kg)   BMI 34.91 kg/m   EXAM: General appearance: alert and no distress Neck: no carotid bruit, no JVD, thyroid not enlarged, symmetric, no tenderness/mass/nodules and Bilateral suborbital ecchymoses Lungs: clear to auscultation bilaterally Heart: regular rate and rhythm, S1, S2 normal, no murmur,  click, rub or gallop Abdomen: soft, non-tender; bowel sounds normal; no masses,  no organomegaly Extremities: extremities normal, atraumatic, no cyanosis or edema  Pulses: 2+ and symmetric Skin: Skin color, texture, turgor normal. No rashes or lesions Neurologic: Grossly normal Psych: Pleasant  EKG: Deferred  ASSESSMENT: 1. History of paroxysmal atrial fibrillation - CHADSVASC of 2 (female, >65) 2. Non-ischemic myoview and normal echo findings - EF 66% (08/2016) 3. Long term antiarrhythmic therapy on flecainide 4. Bilateral hip osteoarthritis - status post bilateral TKR's 5. Bilateral venous insufficiency  PLAN: 1.   [name] recent atrial fibrillation with rapid ventricular response at the setting of multifocal pneumonia.  This is despite being on flecainide and Cardizem.  She has had subsequent episodes and seems to be having more frequent symptomatic A. fib since her pneumonia.  She also had a syncopal episode which I suspect was rate related.  She had noted her heart rate to be very high at the time.  She passed out once many years ago which was when she was originally diagnosed with A. fib.  I am concerned that she has broken through her flecainide and may need more potent antiarrhythmic therapy, such as dofetilide or possible evaluation for ablation.  I discussed this with her today and would prefer to refer her to Dr. Rayann Heman in our practice for further discussion about A. fib management options.  Pixie Casino, MD, [facility], [place] Director of the Advanced Lipid Disorders &  Cardiovascular Risk Reduction Clinic Diplomate of the American Board of Clinical Lipidology Attending Cardiologist  Direct Dial: [phone]  Fax: [phone]  Website:  [email or web address] Speir 02/27/2018, 4:04 PM

## 2018-03-04 ENCOUNTER — Other Ambulatory Visit: Payer: Self-pay | Admitting: Family Medicine

## 2018-03-04 NOTE — Progress Notes (Deleted)
HPI:  Using dictation device. Unfortunately this device frequently misinterprets words/phrases.  Here for CPE: -Concerns and/or follow up today: none ***  Chronic medical problems summarized below were reviewed for changes.***.   AWV was 01/01/18 w/ Epifania Gore  A. Fib/Hx Syncope/HTN/Venous insufficiency: -seeing cardiology for management -meds:eliquis, diltiazem, flecainide, spironolactone  GERD/GB dz: -sees GI -meds: protonix  OAB: -meds: mirabegraon  Hx melanoma: -sees dermatologist  Chronic Pain: -neck, back, knees, feet -sees podiatry -hx osteoarthritis -Hx DDD - seen by [facility] and spine specialists in the past - reports specialist at [facility] told her did not need surgery; reports also saw Dr. Rolena Infante at [facility] ortho about her neck in 2018 and was told did not need surgery -saw Dr. Letta Pate in the past and did injections for lowback pain -sees orho for knees   -Diet: variety of foods, balance and well rounded, larger portion sizes -Exercise: no regular exercise -Taking folic acid, vitamin D or calcium: no -Diabetes and Dyslipidemia Screening: *** -Vaccines: see vaccine section EPIC -pap history:sees gyn, Dr. Ronita Hipps -FDLMP: see nursing notes -sexual activity: *** -wants STI testing (Hep C if born 70-65): no -FH breast, colon or ovarian ca: see FH Last mammogram: does with gyn Last colon cancer screening: had colonosocpy 08/2016 Breast Ca Risk Assessment: see family history and pt history DEXA (>/= 65): ***  -Alcohol, Tobacco, drug use: see social history  Review of Systems - no fevers, unintentional weight loss, vision loss, hearing loss, chest pain, sob, hemoptysis, melena, hematochezia, hematuria, genital discharge, changing or concerning skin lesions, bleeding, bruising, loc, thoughts of self harm or SI  Past Medical History:  Diagnosis Date  . Arthritis   . Atrial fibrillation ([place])   . Constipation   . Difficult intubation    pt states that her neck needs  to be in a neutral position,  scoliosis  . Family history of adverse reaction to anesthesia    nausea  . GERD (gastroesophageal reflux disease)    protonix, hx h. pylori  . History of uterine cancer 04/22/2014   sees Dr. Ronita Hipps; s/p complete hysterectomy  . Hx of colonic polyp   . 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 Melanoma [facility])    sees Dr. Renda Rolls in dermatology  . 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 Peripheral vascular disease ([place])   . PONV (postoperative nausea and vomiting)   . Recurrent UTI   . Recurrent UTI   . S/P hip replacement   . Scoliosis   . Thyroid nodule   . Urinary incontinence   . Venous insufficiency    s/p ablation    Past Surgical History:  Procedure Laterality Date  . ABDOMINAL HYSTERECTOMY  09/10/2013  . BUNIONECTOMY  10/1976  . COLONOSCOPY    . FRACTURE SURGERY  09/1964  . MELANOMA EXCISION  12/2011  . TOTAL HIP ARTHROPLASTY Left 06/17/2014   Procedure: LEFT TOTAL HIP ARTHROPLASTY ANTERIOR APPROACH;  Surgeon: Mcarthur Rossetti, MD;  Location: [place];  Service: Orthopedics;  Laterality: Left;  . TOTAL HIP ARTHROPLASTY Right 09/02/2014   Procedure: RIGHT TOTAL HIP ARTHROPLASTY ANTERIOR APPROACH;  Surgeon: Mcarthur Rossetti, MD;  Location: [place];  Service: Orthopedics;  Laterality: Right;  . VEIN SURGERY  05/2011   venous ablation     Family History  Problem Relation Age of Onset  . Uterine cancer Mother   . Diabetes Brother 86  . Hypertension Father 48  . Hyperlipidemia  Father   . Cancer Father 61  . Epilepsy Father 56  . Arthritis Sister 67  . Cancer Maternal Grandmother   . Stroke Paternal Grandfather   . Arthritis Sister     Social History   Socioeconomic History  . Marital status: Divorced    Spouse name: Not on file  . Number of children: 2  . Years of education: JD  . Highest education level: Not on file  Occupational History   . Occupation: Chief Executive Officer  Social Needs  . Financial resource strain: Not on file  . Food insecurity:    Worry: Not on file    Inability: Not on file  . Transportation needs:    Medical: Not on file    Non-medical: Not on file  Tobacco Use  . Smoking status: Never Smoker  . Smokeless tobacco: Never Used  Substance and Sexual Activity  . Alcohol use: Yes    Alcohol/week: 0.0 standard drinks    Comment: rarely at [facility]   . Drug use: No  . Sexual activity: Not on file  Lifestyle  . Physical activity:    Days per week: Not on file    Minutes per session: Not on file  . Stress: Not on file  Relationships  . Social connections:    Talks on phone: Not on file    Gets together: Not on file    Attends religious service: Not on file    Active member of club or organization: Not on file    Attends meetings of clubs or organizations: Not on file    Relationship status: Not on file  Other Topics Concern  . Not on file  Social History Narrative   Work or School: retired Forensic psychologist      Home Situation: lives alone - takes care of twin grandchildren 7 months in 05/2015      Spiritual Beliefs:       Lifestyle: active, healthy diet        Current Outpatient Medications:  .  apixaban (ELIQUIS) 5 MG TABS tablet, Take 1 tablet (5 mg total) by mouth 2 (two) times daily. BLOOD WORK NEEDED PRIOR TO NEXT REFILL AUTHORIZATION., Disp: 180 tablet, Rfl: 0 .  Cranberry 500 MG TABS, Take 1,000 mg by mouth 2 (two) times daily., Disp: , Rfl:  .  cyclobenzaprine (FLEXERIL) 5 MG tablet, Take 1 tablet (5 mg total) by mouth 3 (three) times daily as needed for muscle spasms., Disp: 20 tablet, Rfl: 0 .  diltiazem (CARDIZEM) 60 MG tablet, Take 60 mg daily if needed for palpitations, Disp: 30 tablet, Rfl: 6 .  diltiazem (TIAZAC) 240 MG 24 hr capsule, TAKE 1 CAPSULE BY MOUTH EVERY DAY (Patient taking differently: Take 240 mg by mouth daily after breakfast. ), Disp: 90 capsule, Rfl: 3 .  flecainide (TAMBOCOR)  100 MG tablet, TAKE 1 TABLET(100 MG) BY MOUTH TWICE DAILY (Patient taking differently: Take 100 mg by mouth 2 (two) times daily. ), Disp: 180 tablet, Rfl: 3 .  methenamine (MANDELAMINE) 0.5 GM tablet, Take 500 mg by mouth 2 (two) times daily. , Disp: , Rfl:  .  mirabegron ER (MYRBETRIQ) 50 MG TB24 tablet, Take 25 mg by mouth every other day. , Disp: , Rfl:  .  pantoprazole (PROTONIX) 40 MG tablet, TAKE 1 TABLET(40 MG) BY MOUTH DAILY (Patient taking differently: Take 40 mg by mouth daily. ), Disp: 90 tablet, Rfl: 0 .  polyethylene glycol (MIRALAX / GLYCOLAX) packet, Take 17 g by mouth daily. (Patient taking  differently: Take 17 g by mouth as needed. ), Disp: 14 each, Rfl: 0 .  senna-docusate (SENOKOT-S) 8.6-50 MG tablet, Take 1 tablet by mouth at bedtime. (Patient taking differently: Take 1 tablet by mouth at bedtime as needed. ), Disp: 30 tablet, Rfl: 0 .  spironolactone (ALDACTONE) 50 MG tablet, TAKE 2 TABLETS BY MOUTH EVERY MORNING, THEN TAKE 1 TABLET EVERY EVENING (Patient taking differently: Take 50-100 mg by mouth See admin instructions. 100 mg every morning, 50 mg every evening), Disp: 270 tablet, Rfl: 3 .  triamcinolone cream (KENALOG) 0.1 %, Apply 1 application topically as needed (for dry skin). , Disp: , Rfl: 1  EXAM:  There were no vitals filed for this visit.  GENERAL: vitals reviewed and listed below, alert, oriented, appears well hydrated and in no acute distress  HEENT: head atraumatic, PERRLA, normal appearance of eyes, ears, nose and mouth. moist mucus membranes.  NECK: supple, no masses or lymphadenopathy  LUNGS: clear to auscultation bilaterally, no rales, rhonchi or wheeze  CV: HRRR, no peripheral edema or cyanosis, normal pedal pulses  ABDOMEN: bowel sounds normal, soft, non tender to palpation, no masses, no rebound or guarding  GU/BREAST: ***  SKIN: no rash or abnormal lesions  MS: normal gait, moves all extremities normally  NEURO: normal gait, speech and  thought processing grossly intact, muscle tone grossly intact throughout  PSYCH: normal affect, pleasant and cooperative  ASSESSMENT AND PLAN:  Discussed the following assessment and plan:  PREVENTIVE EXAM: -Discussed and advised all [place] preventive services health task force level A and B recommendations for age, sex and risks. -Advised at least 150 minutes of exercise per week and a healthy diet with avoidance of (less then 1 serving per week) processed foods, white starches, red meat, fast foods and sweets and consisting of: * 5-9 servings of fresh fruits and vegetables (not corn or potatoes) *nuts and seeds, beans *olives and olive oil *lean meats such as fish and white chicken  *whole grains -labs, studies and vaccines per orders this encounter  There are no diagnoses linked to this encounter. ***  Patient advised to return to clinic immediately if symptoms worsen or persist or new concerns.  There are no Patient Instructions on file for this visit.  No follow-ups on file.  Lucretia Kern, DO

## 2018-03-08 ENCOUNTER — Encounter: Payer: PPO | Admitting: Family Medicine

## 2018-03-12 ENCOUNTER — Telehealth: Payer: Self-pay | Admitting: Internal Medicine

## 2018-03-12 NOTE — Telephone Encounter (Signed)
I hate to make any change in antiarrhythmic drug therapy at this time until she can be evaluated by EP. I think what she is doing now is fine to tide her over until then.   Shelly Bouley [place] MD, [facility]

## 2018-03-12 NOTE — Telephone Encounter (Signed)
New Message  Patient c/o Palpitations:  High priority if patient c/o lightheadedness, shortness of breath, or chest pain  1) How long have you had palpitations/irregular HR/ Afib? Are you having the symptoms now? Last for 8 hours last night  2) Are you currently experiencing lightheadedness, SOB or CP? Lightheadedness and a little SOB  3) Do you have a history of afib (atrial fibrillation) or irregular heart rhythm? yes  4) Have you checked your BP or HR? (document readings if available): 120,70  5) Are you experiencing any other symptoms? Tightness in chest but states that consistent

## 2018-03-12 NOTE — Telephone Encounter (Signed)
Returned call to patient of Dr. Debara Pickett. She reports she has been having more frequent AF episodes since her last visit with Dr. Debara Pickett. She is compliant with her diltiazem 240mg  QD, flecainide 100mg  BID and uses diltiazem 60mg  as needed, which usually resolves her symptomatic AF in 1-1.5 hours  Last night at 6pm she went into th AF. She took 60mg  diltiazem with no changes. She took additional around 11pm and her symptoms resolved around 2am. Her HR was in 110s-115bpm  She states today around 2-3pm she was lying down with grandchild and got up and noticed she was lightheaded, which she experiences when in AF (also has had syncopal episodes in the past which per MD note, he feels is related to AF). She does not report passing out.   She is scheduled to see EP on Jan 9 but would like advice for now. She states if this is not something she should make a big deal about, that is fine, but she is concerned about the frequency of episodes and fast HR.  Since MD is on vacation, routed to DOD for advice

## 2018-03-12 NOTE — Telephone Encounter (Signed)
Notified patient of MD advice. She is very frustrated that her current medications are not effective for her, to which I attempted to explain is the reason why Dr. Debara Pickett has referred her to EP for evaluation. Patient appears frustrated with current standing. Offered to message EP scheduler in the event of a sooner appointment opening up prior to jan 9. Message sent to M. Aida Puffer.

## 2018-03-16 ENCOUNTER — Encounter: Payer: Self-pay | Admitting: *Deleted

## 2018-03-19 ENCOUNTER — Encounter: Payer: Self-pay | Admitting: Family Medicine

## 2018-03-19 NOTE — Progress Notes (Signed)
Medicare Annual Preventive Care Visit  (initial annual wellness or annual wellness exam)  Concerns and/or follow up today:  Here for CPE/AWV:  -Concerns and/or follow up today:   Chronic medical problems summarized below were reviewed for changes. She has a mole on the R medial leg she thinks may have changed. Sees dermatology for yearly skin exams. Saw cardiology and will be seeing Dr. Rayann Heman to consider ablation per her report.  A. Fib: -treatment resistant, seeing Cardiology for management - has been referred recently to Dr. Rayann Heman -no further syncope -eliquis, diltiazem, flecainide, spironolactone  Morbid obesity: -successful with low carb diets in the past in terms of wt reduction, but has trouble sticking with it  GERD/Hx Transaminitis/FH Colon Ca/Chronic constipation: -meds: protonix -had US liver 02/2018 - mild GB sludge, seeing Dr. Harlow Asa about this  Recurrent UTIs/OOB: -sees Dr. Louis Meckel, urology  Chronic Low back pain: -hx significant DDD, has seeing Dr. Letta Pate in the past and responded well to injections -also felt OMM helped in the past  Chronic Macrocytosis dating back at least to 2015: -reports extensive evaluation remotely with heamtology  Hx Thyroid Nodules: -seeing Dr. Harlow Asa for management -s/p biopsy in 2018 -[place] in 2019 stable  Hx of Melanoma: -sees dermatology for management/survaillence  -Diet: variety of foods, balance and well rounded, larger portion sizes -Exercise: no regular exercise -Taking folic acid, vitamin D or calcium: no -Diabetes and Dyslipidemia Screening: not fasting -Vaccines: see vaccine section EPIC -pap history: s/p abdominal hysterectomy for uterine ca - sees gyn, Dr. Ronita Hipps - reports will call to schedule exam -FDLMP: see nursing notes -sexual activity: not discussed -wants STI testing (Hep C if born 72-65): no -FH breast, colon or ovarian ca: see FH Last mammogram: sees Dr. Ronita Hipps for gyn and breast health Last colon  cancer screening: UTD - 08/2016 Breast Ca Risk Assessment: see family history and pt history DEXA (>/= 64): does with gyn  -Alcohol, Tobacco, drug use: see social history  Review of Systems - no fevers, unintentional weight loss, vision loss, hearing loss, chest pain, sob, hemoptysis, melena, hematochezia, hematuria, genital discharge, SI, syncope since fall  See HM section in Epic for other details of completed HM. See scanned documentation under Media Tab for further documentation HPI, health risk assessment. See Media Tab and Care Teams sections in Epic for other providers.  ROS: negative for report of fevers, unintentional weight loss, vision changes, vision loss, hearing loss or change, chest pain, sob, hemoptysis, melena, hematochezia, hematuria, genital discharge or lesions, falls, bleeding or bruising, loc, thoughts of suicide or self harm, memory loss  1.) Patient-completed health risk assessment  - completed and reviewed, see scanned documentation  2.) Review of Medical History: -PMH, PSH, Family History and current specialty and care providers reviewed and updated and listed below  - see scanned in document in chart and below  Past Medical History:  Diagnosis Date  . Atrial fibrillation ([place])   . Constipation   . Difficult intubation    pt states that her neck needs to be in a neutral position,  scoliosis  . GERD (gastroesophageal reflux disease)    protonix, hx h. pylori  . History of uterine cancer 04/22/2014   sees Dr. Ronita Hipps; s/p complete hysterectomy  . Hx of colonic polyp   . 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 Melanoma [facility])    sees Dr. Renda Rolls in dermatology  . Peripheral vascular disease ([place])   . PONV (postoperative nausea and vomiting)   . Recurrent UTI   .  S/P hip replacement   . Scoliosis   . Thyroid nodule   . Urinary incontinence   . Venous insufficiency    s/p ablation    Past Surgical History:  Procedure Laterality Date  . ABDOMINAL  HYSTERECTOMY  09/10/2013  . BUNIONECTOMY  10/1976  . COLONOSCOPY    . FRACTURE SURGERY  09/1964  . MELANOMA EXCISION  12/2011  . TOTAL HIP ARTHROPLASTY Left 06/17/2014   Procedure: LEFT TOTAL HIP ARTHROPLASTY ANTERIOR APPROACH;  Surgeon: Mcarthur Rossetti, MD;  Location: [place];  Service: Orthopedics;  Laterality: Left;  . TOTAL HIP ARTHROPLASTY Right 09/02/2014   Procedure: RIGHT TOTAL HIP ARTHROPLASTY ANTERIOR APPROACH;  Surgeon: Mcarthur Rossetti, MD;  Location: [place];  Service: Orthopedics;  Laterality: Right;  . VEIN SURGERY  05/2011   venous ablation     Social History   Socioeconomic History  . Marital status: Divorced    Spouse name: Not on file  . Number of children: 2  . Years of education: JD  . Highest education level: Not on file  Occupational History  . Occupation: Chief Executive Officer  Social Needs  . Financial resource strain: Not on file  . Food insecurity:    Worry: Not on file    Inability: Not on file  . Transportation needs:    Medical: Not on file    Non-medical: Not on file  Tobacco Use  . Smoking status: Never Smoker  . Smokeless tobacco: Never Used  Substance and Sexual Activity  . Alcohol use: Yes    Alcohol/week: 0.0 standard drinks    Comment: rarely at [facility]   . Drug use: No  . Sexual activity: Not on file  Lifestyle  . Physical activity:    Days per week: Not on file    Minutes per session: Not on file  . Stress: Not on file  Relationships  . Social connections:    Talks on phone: Not on file    Gets together: Not on file    Attends religious service: Not on file    Active member of club or organization: Not on file    Attends meetings of clubs or organizations: Not on file    Relationship status: Not on file  . Intimate partner violence:    Fear of current or ex partner: Not on file    Emotionally abused: Not on file    Physically abused: Not on file    Forced sexual activity: Not on file  Other Topics Concern  . Not on file  Social  History Narrative   Work or School: retired Forensic psychologist      Home Situation: lives alone - takes care of twin grandchildren 7 months in 05/2015      Spiritual Beliefs:       Lifestyle: active, healthy diet       Family History  Problem Relation Age of Onset  . Uterine cancer Mother   . Diabetes Brother 53  . Hypertension Father 86  . Hyperlipidemia Father   . Cancer Father 4  . Epilepsy Father 51  . Arthritis Sister 31  . Cancer Maternal Grandmother   . Other Paternal Grandmother        influenza   . Stroke Paternal Grandfather   . Arthritis Sister     Current Outpatient Medications on File Prior to Visit  Medication Sig Dispense Refill  . apixaban (ELIQUIS) 5 MG TABS tablet Take 1 tablet (5 mg total) by mouth 2 (two) times daily. BLOOD  WORK NEEDED PRIOR TO NEXT REFILL AUTHORIZATION. 180 tablet 0  . Cranberry 500 MG TABS Take 1,000 mg by mouth 2 (two) times daily.    . cyclobenzaprine (FLEXERIL) 5 MG tablet Take 1 tablet (5 mg total) by mouth 3 (three) times daily as needed for muscle spasms. 20 tablet 0  . diltiazem (CARDIZEM) 60 MG tablet Take 60 mg daily if needed for palpitations 30 tablet 6  . diltiazem (TIAZAC) 240 MG 24 hr capsule TAKE 1 CAPSULE BY MOUTH EVERY DAY (Patient taking differently: Take 240 mg by mouth daily after breakfast. ) 90 capsule 3  . flecainide (TAMBOCOR) 100 MG tablet TAKE 1 TABLET(100 MG) BY MOUTH TWICE DAILY (Patient taking differently: Take 100 mg by mouth 2 (two) times daily. ) 180 tablet 3  . methenamine (MANDELAMINE) 0.5 GM tablet Take 500 mg by mouth 2 (two) times daily.     . mirabegron ER (MYRBETRIQ) 50 MG TB24 tablet Take 25 mg by mouth every other day.     . pantoprazole (PROTONIX) 40 MG tablet TAKE 1 TABLET(40 MG) BY MOUTH DAILY 90 tablet 0  . polyethylene glycol (MIRALAX / GLYCOLAX) packet Take 17 g by mouth daily. (Patient taking differently: Take 17 g by mouth as needed. ) 14 each 0  . senna-docusate (SENOKOT-S) 8.6-50 MG tablet Take 1  tablet by mouth at bedtime. (Patient taking differently: Take 1 tablet by mouth at bedtime as needed. ) 30 tablet 0  . spironolactone (ALDACTONE) 50 MG tablet TAKE 2 TABLETS BY MOUTH EVERY MORNING, THEN TAKE 1 TABLET EVERY EVENING (Patient taking differently: Take 50-100 mg by mouth See admin instructions. 100 mg every morning, 50 mg every evening) 270 tablet 3  . triamcinolone cream (KENALOG) 0.1 % Apply 1 application topically as needed (for dry skin).   1   No current facility-administered medications on file prior to visit.      3.) Review of functional ability and level of safety:  Any difficulty hearing?  See scanned documentation  History of falling?  See scanned documentation  Any trouble with IADLs - using a phone, using transportation, grocery shopping, preparing meals, doing housework, doing laundry, taking medications and managing money?  See scanned documentation  Advance Directives? See scanned documentation  See summary of recommendations in Patient Instructions below.  4.) Physical Exam Vitals:   03/20/18 1026  BP: (!) 100/40  Pulse: (!) 115  Temp: (!) 97.4 F (36.3 C)   Estimated body mass index is 35.06 kg/m as calculated from the following:   Height as of this encounter: 5' 8.5" (1.74 m).   Weight as of this encounter: 234 lb (106.1 kg).  EKG (optional): deferred  General: alert, appear well hydrated and in no acute distress  HEENT: visual acuity grossly intact - see nursing notes for vision exam  CV: irr irr  Lungs: CTA bilaterally  SKIN: brown papule on R medial lower knee with some erythema of base  GU: declined, does with gyn  Psych: pleasant and cooperative, no obvious depression or anxiety  Cognitive function grossly intact  See patient instructions for recommendations.  Education and counseling regarding the above review of health provided with a plan for the following: -see scanned patient completed form for further details -fall  prevention strategies discussed  -healthy lifestyle discussed -importance and resources for completing advanced directives discussed -see patient instructions below for any other recommendations provided  4)The following written screening schedule of preventive measures were reviewed with assessment and plan made per below,  orders and patient instructions:      AAA screening done if applicable     Alcohol screening done     Obesity Screening and counseling done     STI screening (Hep C if born 38-65) offered and per pt wishes     Tobacco Screening done done       Pneumococcal (PPSV23 -one dose after 64, one before if risk factors), influenza yearly and hepatitis B vaccines (if high risk - end stage renal disease, IV drugs, homosexual men, live in home for mentally retarded, hemophilia receiving factors) ASSESSMENT/PLAN: done if applicable      Screening mammograph (yearly if >40) ASSESSMENT/PLAN: utd or ordered      Screening Pap smear/pelvic exam (q2 years) ASSESSMENT/PLAN: n/a, declined  - see gyn        Colorectal cancer screening (FOBT yearly or flex sig q4y or colonoscopy q10y or barium enema q4y) ASSESSMENT/PLAN: utd or ordered      Diabetes outpatient self-management training services ASSESSMENT/PLAN: utd or done      Bone mass measurements(covered q2y if indicated - estrogen def, osteoporosis, hyperparathyroid, vertebral abnormalities, osteoporosis or steroids) ASSESSMENT/PLAN: utd or discussed and ordered per pt wishes      Screening for glaucoma(q1y if high risk - diabetes, FH, AA and > 50 or hispanic and > 65) ASSESSMENT/PLAN: utd or advised      Medical nutritional therapy for individuals with diabetes or renal disease ASSESSMENT/PLAN: see orders      Cardiovascular screening blood tests (lipids q5y) ASSESSMENT/PLAN: see orders and labs      Diabetes screening tests ASSESSMENT/PLAN: see orders and labs   7.) Summary:   Medicare annual wellness visit,  subsequent -risk factors and conditions per above assessment were discussed and treatment, recommendations and referrals were offered per documentation above and orders and patient instructions.  Paroxysmal atrial fibrillation (HCC) -seeing cardiology for managment  History of melanoma -sees dermatology -advised prompt evaluation of the mole on her leg - she agreed to call dermatology today - advised if any difficulty getting in in the next few weeks to let us know as advised should always see derm if any changing or different skin lesions  Transaminitis - Plan: Comprehensive metabolic panel -seeing Dr. Harlow Asa -GB sludge  Macrocytosis - Plan: CBC -offered hematology re-eval, she agreed if persists/more elevated  Obesity (BMI 30-39.9) - Plan: HDL cholesterol, Hemoglobin A1c, Cholesterol, total -lifestyle recs  Patient Instructions   BEFORE YOU LEAVE: -labs -follow up: 3-4 months  Call your dermatologist today about evaluation of the mole on your leg. Please let us know if we can assist in a prompt dermatology evaluation.   Check on the cost of the shingles vaccine and consider.  We have ordered labs or studies at this visit. It can take up to 1-2 weeks for results and processing. IF results require follow up or explanation, we will call you with instructions. Clinically stable results will be released to your [facility]. If you have not heard from [place] or cannot find your results in [facility] in 2 weeks please contact our office at [phone].  If you are not yet signed up for [facility], please consider signing up.    [name] , Thank you for taking time to come for your Medicare Wellness Visit. I appreciate your ongoing commitment to your health goals. Please review the following plan we discussed and let me know if I can assist you in the future.   Goals      Weight   .  We recommend the following healthy lifestyle for LIFE: 1) Small portions. But, make sure to get regular (at  least 3 per day), healthy meals and small healthy snacks if needed.  2) Eat a healthy clean diet.   TRY TO EAT: -at least 5-7 servings of low sugar, colorful, and nutrient rich vegetables per day (not corn, potatoes or bananas.) -berries are the best choice if you wish to eat fruit (only eat small amounts if trying to reduce weight)  -lean meets (fish, white meat of chicken or [place]) -vegan proteins for some meals - beans or tofu, whole grains, nuts and seeds -Replace bad fats with good fats - good fats include: fish, nuts and seeds, canola oil, olive oil -small amounts of low fat or non fat dairy -small amounts of100 % whole grains - check the lables -drink plenty of water  AVOID: -SUGAR, sweets, anything with added sugar, corn syrup or sweeteners - must read labels as even foods advertised as "healthy" often are loaded with sugar -if you must have a sweetener, small amounts of stevia may be best -sweetened beverages and artificially sweetened beverages -simple starches (rice, bread, potatoes, pasta, chips, etc - small amounts of 100% whole grains are ok) -red meat, pork, butter -fried foods, fast food, processed food, excessive dairy, eggs and coconut.  3)Get at least 150 minutes of sweaty aerobic exercise per week.  4)Reduce stress - consider counseling, meditation and relaxation to balance other aspects of your life.         .            This is a list of the screening recommended for you and due dates:  Health Maintenance  Topic Date Due  . Mammogram  09/12/2018  . Colon Cancer Screening  08/31/2019  . Tetanus Vaccine  10/13/2023  . Flu Shot  Completed  . DEXA scan (bone density measurement)  Completed  .  Hepatitis C: One time screening is recommended by Center for Disease Control  (CDC) for  adults born from [birth year] through 1966.   Completed  . Pneumonia vaccines  Completed            Lucretia Kern, DO

## 2018-03-20 ENCOUNTER — Ambulatory Visit (INDEPENDENT_AMBULATORY_CARE_PROVIDER_SITE_OTHER): Payer: PPO | Admitting: Family Medicine

## 2018-03-20 ENCOUNTER — Encounter: Payer: Self-pay | Admitting: Family Medicine

## 2018-03-20 VITALS — BP 100/40 | HR 115 | Temp 97.4°F | Ht 68.5 in | Wt 234.0 lb

## 2018-03-20 DIAGNOSIS — E669 Obesity, unspecified: Secondary | ICD-10-CM | POA: Diagnosis not present

## 2018-03-20 DIAGNOSIS — R7401 Elevation of levels of liver transaminase levels: Secondary | ICD-10-CM

## 2018-03-20 DIAGNOSIS — R740 Nonspecific elevation of levels of transaminase and lactic acid dehydrogenase [LDH]: Secondary | ICD-10-CM | POA: Diagnosis not present

## 2018-03-20 DIAGNOSIS — I480 Paroxysmal atrial fibrillation: Secondary | ICD-10-CM

## 2018-03-20 DIAGNOSIS — D7589 Other specified diseases of blood and blood-forming organs: Secondary | ICD-10-CM | POA: Diagnosis not present

## 2018-03-20 DIAGNOSIS — Z85820 Personal history of malignant melanoma of skin: Secondary | ICD-10-CM

## 2018-03-20 DIAGNOSIS — Z0000 Encounter for general adult medical examination without abnormal findings: Secondary | ICD-10-CM

## 2018-03-20 LAB — COMPREHENSIVE METABOLIC PANEL
ALK PHOS: 67 U/L (ref 39–117)
ALT: 16 U/L (ref 0–35)
AST: 18 U/L (ref 0–37)
Albumin: 4.6 g/dL (ref 3.5–5.2)
BUN: 24 mg/dL — ABNORMAL HIGH (ref 6–23)
CO2: 27 mEq/L (ref 19–32)
Calcium: 9.7 mg/dL (ref 8.4–10.5)
Chloride: 100 mEq/L (ref 96–112)
Creatinine, Ser: 0.9 mg/dL (ref 0.40–1.20)
GFR: 65.98 mL/min (ref >60.00)
Glucose, Bld: 85 mg/dL (ref 70–99)
Potassium: 4.5 mEq/L (ref 3.5–5.1)
Sodium: 135 mEq/L (ref 135–145)
TOTAL PROTEIN: 6.7 g/dL (ref 6.0–8.3)
Total Bilirubin: 0.6 mg/dL (ref 0.2–1.2)

## 2018-03-20 LAB — CBC
HCT: 46.3 % — ABNORMAL HIGH (ref 36.0–46.0)
Hemoglobin: 15.9 g/dL — ABNORMAL HIGH (ref 12.0–15.0)
MCHC: 34.5 g/dL (ref 30.0–36.0)
MCV: 101.7 fl — ABNORMAL HIGH (ref 78.0–100.0)
Platelets: 282 10*3/uL (ref 150.0–400.0)
RBC: 4.55 Mil/uL (ref 3.87–5.11)
RDW: 13.4 % (ref 11.5–15.5)
WBC: 5.9 10*3/uL (ref 4.0–10.5)

## 2018-03-20 LAB — HDL CHOLESTEROL: HDL: 45.2 mg/dL (ref >39.00)

## 2018-03-20 LAB — CHOLESTEROL, TOTAL: Cholesterol: 180 mg/dL (ref 0–200)

## 2018-03-20 LAB — HEMOGLOBIN A1C: Hgb A1c MFr Bld: 5.2 % (ref 4.6–6.5)

## 2018-03-20 NOTE — Patient Instructions (Signed)
BEFORE YOU LEAVE: -labs -follow up: 3-4 months  Call your dermatologist today about evaluation of the mole on your leg. Please let us know if we can assist in a prompt dermatology evaluation.   Check on the cost of the shingles vaccine and consider.  We have ordered labs or studies at this visit. It can take up to 1-2 weeks for results and processing. IF results require follow up or explanation, we will call you with instructions. Clinically stable results will be released to your [facility]. If you have not heard from [place] or cannot find your results in [facility] in 2 weeks please contact our office at [phone].  If you are not yet signed up for [facility], please consider signing up.    [name] , Thank you for taking time to come for your Medicare Wellness Visit. I appreciate your ongoing commitment to your health goals. Please review the following plan we discussed and let me know if I can assist you in the future.   Goals      Weight   .  We recommend the following healthy lifestyle for LIFE: 1) Small portions. But, make sure to get regular (at least 3 per day), healthy meals and small healthy snacks if needed.  2) Eat a healthy clean diet.   TRY TO EAT: -at least 5-7 servings of low sugar, colorful, and nutrient rich vegetables per day (not corn, potatoes or bananas.) -berries are the best choice if you wish to eat fruit (only eat small amounts if trying to reduce weight)  -lean meets (fish, white meat of chicken or [place]) -vegan proteins for some meals - beans or tofu, whole grains, nuts and seeds -Replace bad fats with good fats - good fats include: fish, nuts and seeds, canola oil, olive oil -small amounts of low fat or non fat dairy -small amounts of100 % whole grains - check the lables -drink plenty of water  AVOID: -SUGAR, sweets, anything with added sugar, corn syrup or sweeteners - must read labels as even foods advertised as "healthy" often are loaded with sugar -if  you must have a sweetener, small amounts of stevia may be best -sweetened beverages and artificially sweetened beverages -simple starches (rice, bread, potatoes, pasta, chips, etc - small amounts of 100% whole grains are ok) -red meat, pork, butter -fried foods, fast food, processed food, excessive dairy, eggs and coconut.  3)Get at least 150 minutes of sweaty aerobic exercise per week.  4)Reduce stress - consider counseling, meditation and relaxation to balance other aspects of your life.         .            This is a list of the screening recommended for you and due dates:  Health Maintenance  Topic Date Due  . Mammogram  09/12/2018  . Colon Cancer Screening  08/31/2019  . Tetanus Vaccine  10/13/2023  . Flu Shot  Completed  . DEXA scan (bone density measurement)  Completed  .  Hepatitis C: One time screening is recommended by Center for Disease Control  (CDC) for  adults born from [birth year] through 1966.   Completed  . Pneumonia vaccines  Completed

## 2018-03-21 NOTE — Addendum Note (Signed)
Addended by: Agnes Lawrence on: 03/21/2018 08:52 AM   Modules accepted: Orders

## 2018-03-22 ENCOUNTER — Telehealth: Payer: Self-pay | Admitting: *Deleted

## 2018-03-22 ENCOUNTER — Encounter: Payer: Self-pay | Admitting: Cardiology

## 2018-03-22 ENCOUNTER — Encounter

## 2018-03-22 ENCOUNTER — Other Ambulatory Visit (INDEPENDENT_AMBULATORY_CARE_PROVIDER_SITE_OTHER): Payer: PPO

## 2018-03-22 ENCOUNTER — Other Ambulatory Visit: Payer: Self-pay | Admitting: Cardiology

## 2018-03-22 ENCOUNTER — Ambulatory Visit: Payer: PPO | Admitting: Cardiology

## 2018-03-22 VITALS — BP 130/80 | HR 120 | Ht 68.5 in | Wt 236.0 lb

## 2018-03-22 DIAGNOSIS — Z01812 Encounter for preprocedural laboratory examination: Secondary | ICD-10-CM | POA: Diagnosis not present

## 2018-03-22 DIAGNOSIS — I480 Paroxysmal atrial fibrillation: Secondary | ICD-10-CM

## 2018-03-22 DIAGNOSIS — D7589 Other specified diseases of blood and blood-forming organs: Secondary | ICD-10-CM | POA: Diagnosis not present

## 2018-03-22 LAB — FOLATE: Folate: 11.3 ng/mL (ref >5.9)

## 2018-03-22 LAB — VITAMIN B12: Vitamin B-12: 448 pg/mL (ref 211–911)

## 2018-03-22 MED ORDER — METOPROLOL TARTRATE 100 MG PO TABS: ORAL_TABLET | ORAL | 0 refills | Status: DC

## 2018-03-22 NOTE — Patient Instructions (Addendum)
Medication Instructions:  Your physician recommends that you continue on your current medications as directed. Please refer to the Current Medication list given to you today. If you need a refill on your cardiac medications before your next appointment, please call your pharmacy.   Labwork: Pre procedure labs today: BMET & CBC   Testing/Procedures: Your physician has requested that you have cardiac CT. Cardiac computed tomography (CT) is a painless test that uses an x-ray machine to take clear, detailed pictures of your heart. For further information please visit [facility]. Please follow instruction below under special instructions.  Your physician has recommended that you have an ablation. Catheter ablation is a medical procedure used to treat some cardiac arrhythmias (irregular heartbeats). During catheter ablation, a long, thin, flexible tube is put into a blood vessel in your groin (upper thigh), or neck. This tube is called an ablation catheter. It is then guided to your heart through the blood vessel. Radio frequency waves destroy small areas of heart tissue where abnormal heartbeats may cause an arrhythmia to start. Please see the instruction sheet given to you today. Instructions for your ablation: 1. Please arrive at the [facility], Main Entrance "A", of [facility] at 8:30 a.m  on 04/19/2018. 2. Do not eat or drink after midnight the night prior to the procedure. 3. Do not miss any doses of ELIQUIS prior to the morning of the procedure.  4. Do not take any medications the morning of the procedure. 5. Plan for an overnight stay in the hospital. 6. You will need someone to drive you home at discharge.   Follow-Up: Your physician recommends that you schedule a follow-up appointment in: 4 weeks, after your ablation on 04/19/18, with Roderic Palau in the AFib clinic.  Your physician recommends that you schedule a follow-up appointment in:3 months, after your ablation on  04/19/18, with Dr. Curt Bears.   Thank you for choosing [facility]!!   Trinidad Curet, RN [phone]   Special instructions:  CARDIAC CT INSTRUCTIONS:  Please arrive at the [facility] main entrance of [facility] at ________ AM (30-45 minutes prior to test start time)  [facility] [place], [place] [address] [phone]  Proceed to the [facility] Radiology Department (First Floor).  Please follow these instructions carefully (unless otherwise directed):  On the Night Before the Test: . Be sure to Drink plenty of water. . Do not consume any caffeinated/decaffeinated beverages or chocolate 12 hours prior to your test. . Do not take any antihistamines 12 hours prior to your test.  On the Day of the Test: . Drink plenty of water. Do not drink any water within one hour of the test. . Do not eat any food 4 hours prior to the test. . You may take your regular medications prior to the test.  . Take metoprolol (Lopressor) 100 mg two hours prior to this test       After the Test: . Drink plenty of water. . After receiving IV contrast, you may experience a mild flushed feeling. This is normal. . On occasion, you may experience a mild rash up to 24 hours after the test. This is not dangerous. If this occurs, you can take Benadryl 25 mg and increase your fluid intake. . If you experience trouble breathing, this can be serious. If it is severe call 911 IMMEDIATELY. If it is mild, please call our office. . If you take any of these medications: Glipizide/Metformin, Avandament, Glucavance,  please do not take 48 hours after completing test.

## 2018-03-22 NOTE — Addendum Note (Signed)
Addended by: Marlis Edelson C on: 03/22/2018 12:08 PM   Modules accepted: Orders

## 2018-03-22 NOTE — Progress Notes (Signed)
Electrophysiology Office Note   Date:  03/22/2018   ID:  [name], DOB 1950/04/07, MRN [id number]  PCP:  Shelly Kern, DO  Cardiologist: [name] Primary Electrophysiologist:  Dr [name]    CC: Evaluation for atrial fibrillation   History of Present Illness: [name] is a 68 y.o. female who is being seen today for the evaluation of atrial fibrillation at the request of [name], Shelly Corwin, MD. Presenting today for electrophysiology evaluation.  Her atrial fibrillation was initially noted in 2012 and initially controlled with diltiazem.  She was admitted in December 2019 with multifocal pneumonia and acute onset of atrial fibrillation with rapid rates.  Heart rates were as high as the 180s despite being on flecainide and Cardizem.  She spontaneously converted to sinus rhythm.  She has had multiple episodes of tachycardia at home since that time. She is symptomatic with chest tension, shortness of breath, and lightheadedness. She did have one syncopal episode going from sitting to standing, she reports that she was not aware of any atrial fibrillation at the time.  She fell and had bilateral ecchymosis on the forehead under both orbits. She continues to have breakthrough episodes, the longest of which lasted 20 hours. She denies any significant alcohol use, snoring, or daytime somnolence.   Today, she denies symptoms of palpitations, chest pain, orthopnea, PND, claudication, dizziness, presyncope, syncope, bleeding, or neurologic sequela. The patient is tolerating medications without difficulties.    Past Medical History:  Diagnosis Date  . Atrial fibrillation ([place])   . Constipation   . Difficult intubation    pt states that her neck needs to be in a neutral position,  scoliosis  . GERD (gastroesophageal reflux disease)    protonix, hx h. pylori  . History of uterine cancer 04/22/2014   sees Dr. Ronita [name]; s/p complete hysterectomy  . Hx of colonic polyp   . Lumbar and sacral  osteoarthritis 12/10/2013  . Melanoma [name])    sees Dr. Renda [name] in dermatology  . Peripheral vascular disease ([place])   . PONV (postoperative nausea and vomiting)   . Recurrent UTI   . S/P hip replacement   . Scoliosis   . Thyroid nodule   . Urinary incontinence   . Venous insufficiency    s/p ablation   Past Surgical History:  Procedure Laterality Date  . ABDOMINAL HYSTERECTOMY  09/10/2013  . BUNIONECTOMY  10/1976  . COLONOSCOPY    . FRACTURE SURGERY  09/1964  . MELANOMA EXCISION  12/2011  . TOTAL HIP ARTHROPLASTY Left 06/17/2014   Procedure: LEFT TOTAL HIP ARTHROPLASTY ANTERIOR APPROACH;  Surgeon: Shelly Rossetti, MD;  Location: [place];  Service: Orthopedics;  Laterality: Left;  . TOTAL HIP ARTHROPLASTY Right 09/02/2014   Procedure: RIGHT TOTAL HIP ARTHROPLASTY ANTERIOR APPROACH;  Surgeon: Shelly Rossetti, MD;  Location: [place];  Service: Orthopedics;  Laterality: Right;  . VEIN SURGERY  05/2011   venous ablation      Current Outpatient Medications  Medication Sig Dispense Refill  . apixaban (ELIQUIS) 5 MG TABS tablet Take 1 tablet (5 mg total) by mouth 2 (two) times daily. BLOOD WORK NEEDED PRIOR TO NEXT REFILL AUTHORIZATION. 180 tablet 0  . Cranberry 500 MG TABS Take 1,000 mg by mouth 2 (two) times daily.    . cyclobenzaprine (FLEXERIL) 5 MG tablet Take 1 tablet (5 mg total) by mouth 3 (three) times daily as needed for muscle spasms. 20 tablet 0  . diltiazem (CARDIZEM) 60 MG tablet Take 60  mg daily if needed for palpitations 30 tablet 6  . diltiazem (TIAZAC) 240 MG 24 hr capsule TAKE 1 CAPSULE BY MOUTH EVERY DAY (Patient taking differently: Take 240 mg by mouth daily after breakfast. ) 90 capsule 3  . flecainide (TAMBOCOR) 100 MG tablet TAKE 1 TABLET(100 MG) BY MOUTH TWICE DAILY (Patient taking differently: Take 100 mg by mouth 2 (two) times daily. ) 180 tablet 3  . methenamine (MANDELAMINE) 0.5 GM tablet Take 500 mg by mouth 2 (two) times daily.     . mirabegron ER  (MYRBETRIQ) 50 MG TB24 tablet Take 25 mg by mouth every other day.     . pantoprazole (PROTONIX) 40 MG tablet TAKE 1 TABLET(40 MG) BY MOUTH DAILY 90 tablet 0  . polyethylene glycol (MIRALAX / GLYCOLAX) packet Take 17 g by mouth daily. (Patient taking differently: Take 17 g by mouth as needed. ) 14 each 0  . senna-docusate (SENOKOT-S) 8.6-50 MG tablet Take 1 tablet by mouth at bedtime. (Patient taking differently: Take 1 tablet by mouth at bedtime as needed. ) 30 tablet 0  . spironolactone (ALDACTONE) 50 MG tablet TAKE 2 TABLETS BY MOUTH EVERY MORNING, THEN TAKE 1 TABLET EVERY EVENING (Patient taking differently: Take 50-100 mg by mouth See admin instructions. 100 mg every morning, 50 mg every evening) 270 tablet 3  . triamcinolone cream (KENALOG) 0.1 % Apply 1 application topically as needed (for dry skin).   1  . vitamin C (ASCORBIC ACID) 500 MG tablet Take 500 mg by mouth daily.     No current facility-administered medications for this visit.     Allergies:   Hydrolyzed silk; Ciprofloxacin; and Gluten meal   Social History:  The patient  reports that she has never smoked. She has never used smokeless tobacco. She reports current alcohol use. She reports that she does not use drugs.   Family History:  The patient's family history includes Arthritis in her sister; Arthritis (age of onset: 66) in her sister; Cancer in her maternal grandmother; Cancer (age of onset: 11) in her father; Diabetes (age of onset: 49) in her brother; Epilepsy (age of onset: 17) in her father; Hyperlipidemia in her father; Hypertension (age of onset: 9) in her father; Other in her paternal grandmother; Stroke in her paternal grandfather; Uterine cancer in her mother.    ROS:  Please see the history of present illness.   Otherwise, review of systems is positive for shortness of breath, edema, difficulty urinating, back pain.   All other systems are reviewed and negative.    PHYSICAL EXAM: VS:  BP 130/80   Pulse (!) 120    Ht 5' 8.5" (1.74 m)   Wt 236 lb (107 kg)   BMI 35.36 kg/m  , BMI Body mass index is 35.36 kg/m. GEN: Well nourished, well developed, in no acute distress  HEENT: normal  Neck: no JVD, carotid bruits, or masses Cardiac: RRR, tachycardia; no murmurs, rubs, or gallops, trace edema Respiratory:  clear to auscultation bilaterally, normal work of breathing GI: soft, nontender, nondistended, + BS MS: no deformity or atrophy  Skin: warm and dry Neuro:  Strength and sensation are intact Psych: euthymic mood, full affect  EKG:  EKG is ordered today. Personal review of the ekg ordered shows typical atrial flutter HR 120, PR 196, QRS 102, QTc 429. Computer read as STEMI, this is likely an error 2/2 flutter waves, no anginal symptoms  Recent Labs: 02/10/2018: B Natriuretic Peptide 207.0 02/12/2018: Magnesium 2.1 03/20/2018: ALT 16;  BUN 24; Creatinine, Ser 0.90; Hemoglobin 15.9; Platelets 282.0; Potassium 4.5; Sodium 135    Lipid Panel     Component Value Date/Time   CHOL 180 03/20/2018 1117   TRIG 53.0 09/07/2015 1211   HDL 45.20 03/20/2018 1117   CHOLHDL 3 09/07/2015 1211   VLDL 10.6 09/07/2015 1211   LDLCALC 87 09/07/2015 1211     Wt Readings from Last 3 Encounters:  03/22/18 236 lb (107 kg)  03/20/18 234 lb (106.1 kg)  02/27/18 236 lb 6.4 oz (107.2 kg)      Other studies Reviewed: Additional studies/ records that were reviewed today include: TTE 08/25/16  Review of the above records today demonstrates:  - Left ventricle: The cavity size was normal. Wall thickness was   increased in a pattern of mild LVH. Doppler parameters are   consistent with abnormal left ventricular relaxation (grade 1   diastolic dysfunction). - Left atrium:  The atrium was normal in size.   Myoview 08/25/17  Nuclear stress EF: 66%.  There was no ST segment deviation noted during stress.  The study is normal.  This is a low risk study.  The left ventricular ejection fraction is hyperdynamic  (>65%).   Breast attenuation no ischemia or infarct EF 66%  ASSESSMENT AND PLAN:  1.  Paroxysmal atrial fibrillation/typical atrial flutter: On Eliquis.  Currently on flecainide and diltiazem.  She is continued to have breakthrough episodes with an episode of syncope and fall. Possible post-termination pause vs orthostatic. Therapeutic strategies for afib including Tikosyn and ablation were discussed in detail with the patient today. Risk, benefits, and alternatives to EP study and radiofrequency ablation for afib were also discussed in detail today. These risks include but are not limited to stroke, bleeding, vascular damage, tamponade, perforation, damage to the esophagus, lungs, and other structures, pulmonary vein stenosis, worsening renal function, and death. The patient understands these risk and wishes to proceed.  We Alphonza Tramell therefore proceed with catheter ablation at the next available time.  This patients CHA2DS2-VASc Score and unadjusted Ischemic Stroke Rate (% per year) is equal to 3.2 % stroke rate/year from a score of 3  Above score calculated as 1 point each if present [CHF, HTN, DM, Vascular=MI/PAD/Aortic Plaque, Age if 65-74, or Female] Above score calculated as 2 points each if present [Age > 75, or Stroke/TIA/TE]   Current medicines are reviewed at length with the patient today.   The patient does not have concerns regarding her medicines.  The following changes were made today:  none  Labs/ tests ordered today include:  No orders of the defined types were placed in this encounter.    Disposition:  Tomasa Dobransky schedule afib/flutter ablation. FU with Afib clinic and Christyna Letendre post procedure.  Johann Capers, [place]  03/22/2018 9:12 AM     [name] [address] [place] [address] [phone] (office) [phone] (fax)  I have seen and examined this patient with Adline Peals.  Agree with above, note added to reflect my findings.  On  exam, tachycardic, regular, no murmurs, lungs clear.  Presents to clinic today in atrial flutter.  I discussed with her further options including medical management versus ablation.  At this point she would prefer to have ablation.  Risks and benefits were discussed as above.  Case discussed with primary cardiology  Lucien Budney M. Dalissa Lovin MD 03/22/2018 9:51 AM

## 2018-03-22 NOTE — Addendum Note (Signed)
Addended by: Stanton Kidney on: 03/22/2018 05:27 PM   Modules accepted: Orders

## 2018-03-22 NOTE — Telephone Encounter (Addendum)
Called patient at 4:23 pm today.  Informed pt that she was supposed to have blood work completed before she left the office this morning.  Pt apologizes and misunderstood.  She will stop by the office Monday, 1/13, for labs.

## 2018-03-26 ENCOUNTER — Other Ambulatory Visit: Payer: PPO

## 2018-03-26 DIAGNOSIS — D485 Neoplasm of uncertain behavior of skin: Secondary | ICD-10-CM | POA: Diagnosis not present

## 2018-03-26 DIAGNOSIS — Z23 Encounter for immunization: Secondary | ICD-10-CM | POA: Diagnosis not present

## 2018-03-26 DIAGNOSIS — D0371 Melanoma in situ of right lower limb, including hip: Secondary | ICD-10-CM | POA: Diagnosis not present

## 2018-03-29 ENCOUNTER — Other Ambulatory Visit: Payer: PPO | Admitting: *Deleted

## 2018-03-29 DIAGNOSIS — I480 Paroxysmal atrial fibrillation: Secondary | ICD-10-CM | POA: Diagnosis not present

## 2018-03-29 DIAGNOSIS — Z01812 Encounter for preprocedural laboratory examination: Secondary | ICD-10-CM

## 2018-03-29 LAB — BASIC METABOLIC PANEL
BUN/Creatinine Ratio: 30 — ABNORMAL HIGH (ref 12–28)
BUN: 24 mg/dL (ref 8–27)
CO2: 23 mmol/L (ref 20–29)
Calcium: 9.7 mg/dL (ref 8.7–10.3)
Chloride: 100 mmol/L (ref 96–106)
Creatinine, Ser: 0.81 mg/dL (ref 0.57–1.00)
GFR calc Af Amer: 86 mL/min/{1.73_m2} (ref >59)
GFR calc non Af Amer: 74 mL/min/{1.73_m2} (ref >59)
Glucose: 90 mg/dL (ref 65–99)
Potassium: 4.8 mmol/L (ref 3.5–5.2)
Sodium: 138 mmol/L (ref 134–144)

## 2018-03-29 LAB — CBC
HEMATOCRIT: 44.2 % (ref 34.0–46.6)
Hemoglobin: 15.4 g/dL (ref 11.1–15.9)
MCH: 34.4 pg — ABNORMAL HIGH (ref 26.6–33.0)
MCHC: 34.8 g/dL (ref 31.5–35.7)
MCV: 99 fL — ABNORMAL HIGH (ref 79–97)
Platelets: 223 10*3/uL (ref 150–450)
RBC: 4.48 x10E6/uL (ref 3.77–5.28)
RDW: 12.1 % (ref 11.7–15.4)
WBC: 4.1 10*3/uL (ref 3.4–10.8)

## 2018-04-09 ENCOUNTER — Telehealth: Payer: Self-pay | Admitting: Cardiology

## 2018-04-09 NOTE — Telephone Encounter (Signed)
New message   Patient is having an ablation done on 04/19/2018. Patient would like a call back to answer questions about procedure.

## 2018-04-09 NOTE — Telephone Encounter (Signed)
Pt has questions about ablation.

## 2018-04-09 NOTE — Telephone Encounter (Signed)
Pt reports going to her dermatologist, Dr. Renda Rolls, recently and findings of:  suspicious mole, clear margins, grade 0 melanoma. She wanted to know what instructions were for blood thinner and ablation in case dermatology needs to stop it for mole removal/biopsy.  Pt advised about Eliquis parameters pre and post procedure. She will discuss w/ dermatology and let us know if she has to reschedule ablation.  She states that she prefers not to cancel procedure d/t "a lot of afib" and says that dermatology told her mole could wait b/c they caught it early.  But she is going to re-discuss w/ them.   She will call office back if she needs to reschedule procedure.

## 2018-04-10 ENCOUNTER — Telehealth (HOSPITAL_COMMUNITY): Payer: Self-pay | Admitting: Emergency Medicine

## 2018-04-10 NOTE — Telephone Encounter (Signed)
Reaching out to patient to offer assistance regarding upcoming cardiac imaging study; pt verbalizes understanding of appt date/time, parking situation and where to check in, pre-test NPO status and medications ordered, and verified current allergies; name and call back number provided for further questions should they arise Marchia Bond RN Navigator Cardiac [facility] [place] [facility] [phone] office [phone] cell   Patient to take metoprolol 2 hr prior to scan Will NOT take spironolactone AM of scan

## 2018-04-12 ENCOUNTER — Ambulatory Visit (HOSPITAL_COMMUNITY): Admission: RE | Admit: 2018-04-12 | Payer: PPO | Source: Ambulatory Visit

## 2018-04-12 ENCOUNTER — Ambulatory Visit (HOSPITAL_COMMUNITY)
Admission: RE | Admit: 2018-04-12 | Discharge: 2018-04-12 | Disposition: A | Discharge status: Home / Self Care | Payer: PPO | Source: Ambulatory Visit | Attending: Cardiology | Admitting: Cardiology

## 2018-04-12 DIAGNOSIS — I480 Paroxysmal atrial fibrillation: Secondary | ICD-10-CM | POA: Diagnosis not present

## 2018-04-12 MED ORDER — IOPAMIDOL (ISOVUE-370) INJECTION 76%
80.0000 mL | Freq: Once | INTRAVENOUS | Status: AC | PRN
  Administered 2018-04-12: 80 mL via INTRAVENOUS

## 2018-04-13 ENCOUNTER — Telehealth: Payer: Self-pay

## 2018-04-13 MED ORDER — DRONEDARONE HCL 400 MG PO TABS: 400.0000 mg | ORAL_TABLET | Freq: Two times a day (BID) | ORAL | 3 refills | Status: DC

## 2018-04-13 NOTE — Telephone Encounter (Signed)
-----   Message from Will Meredith Leeds, MD sent at 04/13/2018  8:11 AM EST ----- Large hiatal hernia noted.  Needs evaluation by general surgery for possible therapy.  Stop flecainide and switch to Multitak.  Will reevaluate after she has been evaluated for surgery.  We will hold off on ablation at this time. CT with out LAA thrombus reviewed.

## 2018-04-13 NOTE — Telephone Encounter (Signed)
Aware I will obtain Multaq samples and call her Tuesday.

## 2018-04-13 NOTE — Telephone Encounter (Signed)
Pt advised CT results and will be seeing Dr. Delana Meyer general surgeon on 04/18/2018.Marland Kitchen RX sent in for Multaq and pt verbalized understanding to wait three days prior to starting the multaq after stopping the Flecainide.

## 2018-04-17 NOTE — Telephone Encounter (Signed)
Informed pt samples left at front desk. She will pick it up this week. She is aware I will follow up w/ her in several weeks to see how plan are going on her hernia. Patient verbalized understanding and agreeable to plan.

## 2018-04-18 DIAGNOSIS — R945 Abnormal results of liver function studies: Secondary | ICD-10-CM | POA: Diagnosis not present

## 2018-04-18 DIAGNOSIS — K449 Diaphragmatic hernia without obstruction or gangrene: Secondary | ICD-10-CM | POA: Diagnosis not present

## 2018-04-18 DIAGNOSIS — K828 Other specified diseases of gallbladder: Secondary | ICD-10-CM | POA: Diagnosis not present

## 2018-04-19 ENCOUNTER — Encounter (HOSPITAL_COMMUNITY): Admission: RE | Payer: Self-pay | Source: Home / Self Care

## 2018-04-19 ENCOUNTER — Ambulatory Visit (HOSPITAL_COMMUNITY): Admission: RE | Admit: 2018-04-19 | Payer: PPO | Source: Home / Self Care | Admitting: Cardiology

## 2018-04-19 SURGERY — ATRIAL FIBRILLATION ABLATION
Anesthesia: General

## 2018-04-23 ENCOUNTER — Telehealth: Payer: Self-pay | Admitting: Cardiology

## 2018-04-23 MED ORDER — FLECAINIDE ACETATE 100 MG PO TABS: 100.0000 mg | ORAL_TABLET | Freq: Two times a day (BID) | ORAL | 3 refills | Status: DC

## 2018-04-23 NOTE — Telephone Encounter (Signed)
Pt reports worse AFib since starting Multaq last week. States HR 110 while on phone.  States it is jumping around with highest close to 160.  She does report chest discomfort with elevated HRs. Advised to stop Multaq and restart Flecainide 100 mg BID, per Dr. Curt Bears. She will restart this medication Thursday night/Friday morning. Patient verbalized understanding and agreeable to plan.

## 2018-04-23 NOTE — Telephone Encounter (Signed)
New Message    Pt c/o medication issue:  1. Name of Medication: Multaq  2. How are you currently taking this medication (dosage and times per day)? 400mg  twice a day  3. Are you having a reaction (difficulty breathing--STAT)? no  4. What is your medication issue? Patient states she doesn't like the medication it makes her afib more tensed and erratic.  Patient would like to discuss with nurse

## 2018-05-01 ENCOUNTER — Telehealth: Payer: Self-pay | Admitting: Cardiology

## 2018-05-01 NOTE — Telephone Encounter (Signed)
New Message         Patient returned your call and states she is a lot better the a-fib has turned for the best. Pls call and advise.

## 2018-05-01 NOTE — Telephone Encounter (Signed)
Pt will be seeing Dr. Excell Seltzer on 3/3 to discuss hernia repair. States her Afib "is in a lul" right now and she is doing fine. She will call the office if she has issues w/ HR control while she is dealing w/ needed repair. Pt and I agreed to speak next month to see where she was at w/ hernia repair. She appreciates my follow up.

## 2018-05-03 ENCOUNTER — Other Ambulatory Visit: Payer: Self-pay | Admitting: Internal Medicine

## 2018-05-03 DIAGNOSIS — I4891 Unspecified atrial fibrillation: Secondary | ICD-10-CM

## 2018-05-04 DIAGNOSIS — L905 Scar conditions and fibrosis of skin: Secondary | ICD-10-CM | POA: Diagnosis not present

## 2018-05-04 DIAGNOSIS — D0371 Melanoma in situ of right lower limb, including hip: Secondary | ICD-10-CM | POA: Diagnosis not present

## 2018-05-07 ENCOUNTER — Ambulatory Visit (INDEPENDENT_AMBULATORY_CARE_PROVIDER_SITE_OTHER): Payer: PPO | Admitting: Family Medicine

## 2018-05-07 ENCOUNTER — Encounter: Payer: Self-pay | Admitting: Family Medicine

## 2018-05-07 ENCOUNTER — Ambulatory Visit (INDEPENDENT_AMBULATORY_CARE_PROVIDER_SITE_OTHER): Payer: PPO

## 2018-05-07 VITALS — BP 124/82 | HR 65 | Temp 97.6°F | Resp 12 | Ht 68.5 in | Wt 231.2 lb

## 2018-05-07 DIAGNOSIS — J988 Other specified respiratory disorders: Secondary | ICD-10-CM

## 2018-05-07 DIAGNOSIS — R05 Cough: Secondary | ICD-10-CM | POA: Diagnosis not present

## 2018-05-07 DIAGNOSIS — R059 Cough, unspecified: Secondary | ICD-10-CM

## 2018-05-07 MED ORDER — BENZONATATE 100 MG PO CAPS: 200.0000 mg | ORAL_CAPSULE | Freq: Two times a day (BID) | ORAL | 0 refills | Status: AC | PRN

## 2018-05-07 MED ORDER — HYDROCODONE-HOMATROPINE 5-1.5 MG/5ML PO SYRP: 5.0000 mL | ORAL_SOLUTION | Freq: Two times a day (BID) | ORAL | 0 refills | Status: AC | PRN

## 2018-05-07 NOTE — Progress Notes (Signed)
ACUTE VISIT  HPI:  Chief Complaint  Patient presents with  . Persistant Cough    started 1 week ago  . Chest Congestion  . Fatigue    [name] is a 68 y.o.female with history of atrial fibrillation, recent community-acquired pneumonia (02/09/2018), and GERD here today complaining of 6 days of respiratory symptoms. Cough is interfering with his sleep. She is concerned about possible pneumonia, because past history. "Little" shortness of breath, negative for wheezing or chest pain.   Cough  This is a new problem. The current episode started in the past 7 days. The problem has been unchanged. The cough is non-productive. Associated symptoms include myalgias, nasal congestion, postnasal drip, rhinorrhea and shortness of breath. Pertinent negatives include no chest pain, ear pain, eye redness, fever, headaches, heartburn, hemoptysis, rash, sore throat or wheezing. The symptoms are aggravated by lying down. She has tried prescription cough suppressant for the symptoms. The treatment provided mild relief. Her past medical history is significant for pneumonia. There is no history of asthma or environmental allergies.    No Hx of recent travel. No sick contact. No known insect bite.   OTC medications for this problem: Tylenol. She also tried OTC antihistaminics but caused drymouth. She still has Benzonatate from old Rx.  "Big" hiatal hernia, planning on visiting surgeon next week.  Review of Systems  Constitutional: Positive for activity change and fatigue. Negative for appetite change and fever.  HENT: Positive for congestion, postnasal drip and rhinorrhea. Negative for ear pain, mouth sores, sinus pressure, sore throat and trouble swallowing.   Eyes: Negative for discharge and redness.  Respiratory: Positive for cough and shortness of breath. Negative for hemoptysis and wheezing.   Cardiovascular: Negative for chest pain and palpitations.  Gastrointestinal:  Negative for abdominal pain, diarrhea, heartburn, nausea and vomiting.  Musculoskeletal: Positive for myalgias. Negative for neck pain.  Skin: Negative for rash.  Allergic/Immunologic: Negative for environmental allergies.  Neurological: Negative for weakness and headaches.  Hematological: Negative for adenopathy. Does not bruise/bleed easily.    Current Outpatient Medications on File Prior to Visit  Medication Sig Dispense Refill  . apixaban (ELIQUIS) 5 MG TABS tablet Take 1 tablet (5 mg total) by mouth 2 (two) times daily. 180 tablet 0  . Cranberry 500 MG TABS Take 1,000 mg by mouth 2 (two) times daily.    . cyclobenzaprine (FLEXERIL) 5 MG tablet Take 1 tablet (5 mg total) by mouth 3 (three) times daily as needed for muscle spasms. 20 tablet 0  . diltiazem (CARDIZEM) 60 MG tablet Take 60 mg daily if needed for palpitations (Patient taking differently: Take 60 mg by mouth daily as needed (palpitations). Take 60 mg daily if needed for palpitations) 30 tablet 6  . diltiazem (TIAZAC) 240 MG 24 hr capsule TAKE 1 CAPSULE BY MOUTH EVERY DAY (Patient taking differently: Take 240 mg by mouth daily after breakfast. ) 90 capsule 3  . flecainide (TAMBOCOR) 100 MG tablet Take 1 tablet (100 mg total) by mouth 2 (two) times daily. 180 tablet 3  . methenamine (MANDELAMINE) 1 g tablet Take 500 mg by mouth 2 (two) times daily.     . metoprolol tartrate (LOPRESSOR) 100 MG tablet Take 100 mg (1 tablet) two hours prior to your CT test 1 tablet 0  . mirabegron ER (MYRBETRIQ) 50 MG TB24 tablet Take 25 mg by mouth every other day.     . pantoprazole (PROTONIX) 40 MG tablet TAKE 1 TABLET(40 MG)  BY MOUTH DAILY (Patient taking differently: Take 40 mg by mouth daily. ) 90 tablet 0  . polyethylene glycol (MIRALAX / GLYCOLAX) packet Take 17 g by mouth daily. (Patient taking differently: Take 17 g by mouth daily as needed for moderate constipation. ) 14 each 0  . senna-docusate (SENOKOT-S) 8.6-50 MG tablet Take 1 tablet by  mouth at bedtime. (Patient taking differently: Take 1 tablet by mouth at bedtime as needed. ) 30 tablet 0  . spironolactone (ALDACTONE) 50 MG tablet TAKE 2 TABLETS BY MOUTH EVERY MORNING, THEN TAKE 1 TABLET EVERY EVENING (Patient taking differently: Take 50-100 mg by mouth See admin instructions. 100 mg every morning, 50 mg every evening) 270 tablet 3  . triamcinolone cream (KENALOG) 0.1 % Apply 1 application topically as needed (for dry skin).   1  . vitamin C (ASCORBIC ACID) 500 MG tablet Take 500 mg by mouth 2 (two) times daily.      No current facility-administered medications on file prior to visit.      Past Medical History:  Diagnosis Date  . Atrial fibrillation ([place])   . Constipation   . Difficult intubation    pt states that her neck needs to be in a neutral position,  scoliosis  . GERD (gastroesophageal reflux disease)    protonix, hx h. pylori  . History of uterine cancer 04/22/2014   sees Dr. Ronita Hipps; s/p complete hysterectomy  . Hx of colonic polyp   . 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 Melanoma [facility])    sees Dr. Renda Rolls in dermatology  . Peripheral vascular disease ([place])   . PONV (postoperative nausea and vomiting)   . Recurrent UTI   . S/P hip replacement   . Scoliosis   . Thyroid nodule   . Urinary incontinence   . Venous insufficiency    s/p ablation   Allergies  Allergen Reactions  . Hydrolyzed Silk Hives and Other (See Comments)    Silk tape-blisters  . Ciprofloxacin     Reactions with antiarrythmic  . Gluten Meal Other (See Comments)    reflux    Social History   Socioeconomic History  . Marital status: Divorced    Spouse name: Not on file  . Number of children: 2  . Years of education: JD  . Highest education level: Not on file  Occupational History  . Occupation: Chief Executive Officer  Social Needs  . Financial resource strain: Not on file  . Food insecurity:    Worry: Not on file    Inability: Not on file  . Transportation needs:     Medical: Not on file    Non-medical: Not on file  Tobacco Use  . Smoking status: Never Smoker  . Smokeless tobacco: Never Used  Substance and Sexual Activity  . Alcohol use: Yes    Alcohol/week: 0.0 standard drinks    Comment: rarely at [facility]   . Drug use: No  . Sexual activity: Not on file  Lifestyle  . Physical activity:    Days per week: Not on file    Minutes per session: Not on file  . Stress: Not on file  Relationships  . Social connections:    Talks on phone: Not on file    Gets together: Not on file    Attends religious service: Not on file    Active member of club or organization: Not on file    Attends meetings of clubs or organizations: Not on file    Relationship status: Not on  file  Other Topics Concern  . Not on file  Social History Narrative   Work or School: retired Forensic psychologist      Home Situation: lives alone - takes care of twin grandchildren 7 months in 05/2015      Spiritual Beliefs:       Lifestyle: active, healthy diet       Vitals:   05/07/18 1556  BP: 124/82  Pulse: 65  Resp: 12  Temp: 97.6 F (36.4 C)  SpO2: 96%   Body mass index is 34.65 kg/m.  Physical Exam  Nursing note and vitals reviewed. Constitutional: She is oriented to person, place, and time. She appears well-developed. She does not appear ill. No distress.  HENT:  Head: Atraumatic.  Nose: Rhinorrhea present. Right sinus exhibits no maxillary sinus tenderness and no frontal sinus tenderness. Left sinus exhibits no maxillary sinus tenderness and no frontal sinus tenderness.  Mouth/Throat: Oropharynx is clear and moist and mucous membranes are normal.  Postnasal drainage mild dysphonia.  Eyes: Conjunctivae are normal.  Neck: No muscular tenderness present. No edema and no erythema present.  Cardiovascular: Normal rate and regular rhythm.  No murmur heard. Respiratory: Effort normal. No stridor. No respiratory distress. She has no wheezes. She has rhonchi. She has no rales.    Lymphadenopathy:       Head (right side): No submandibular adenopathy present.       Head (left side): No submandibular adenopathy present.    She has no cervical adenopathy.  Neurological: She is alert and oriented to person, place, and time. She has normal strength.  Skin: Skin is warm. No rash noted. No erythema.  Psychiatric: Her mood appears anxious.  Well groomed, good eye contact.    ASSESSMENT AND PLAN:  Ms. Rylann was seen today for persistant cough, chest congestion and fatigue.  Diagnoses and all orders for this visit:  Cough Lung auscultation today negative for rales or wheezing. Mild rhonchi. CXR: Hyperinflation. Pending report. Side effects of Hydrocodone discussed.  -     DG Chest 2 View; Future -     benzonatate (TESSALON) 100 MG capsule; Take 2 capsules (200 mg total) by mouth 2 (two) times daily as needed for up to 7 days. -     HYDROcodone-homatropine (HYCODAN) 5-1.5 MG/5ML syrup; Take 5 mLs by mouth every 12 (twelve) hours as needed for up to 10 days.  Respiratory tract infection Acute bronchitis. Explained that it is most likely viral,so symptomatic treatment is recommended for now. Because atrial fib we do not have many options in regard to abx treatment,will consider Doxycycline if needed.  Instructed about warning signs. Plain Mucinex may help.      Return if symptoms worsen or fail to improve.     Daric Koren G. [place], MD  [facility]. [place] office.

## 2018-05-07 NOTE — Patient Instructions (Addendum)
  [name] I have seen you today for an acute visit.  A few things to remember from today's visit:   Cough - Plan: DG Chest 2 View, benzonatate (TESSALON) 100 MG capsule  Respiratory tract infection Adequate hydration, honey, and plain Mucinex may help with cough. Monitor for fever.   In general please monitor for signs of worsening symptoms and seek immediate medical attention if any concerning.  If symptoms are not resolved in 1-2 weeks you should schedule a follow up appointment with your doctor, before if needed.  I hope you get better soon!

## 2018-05-08 ENCOUNTER — Encounter: Payer: Self-pay | Admitting: Family Medicine

## 2018-05-14 ENCOUNTER — Other Ambulatory Visit: Payer: Self-pay | Admitting: Cardiology

## 2018-05-14 MED ORDER — DILTIAZEM HCL 60 MG PO TABS: ORAL_TABLET | ORAL | 9 refills | Status: DC

## 2018-05-15 ENCOUNTER — Ambulatory Visit: Payer: Self-pay | Admitting: General Surgery

## 2018-05-15 DIAGNOSIS — R945 Abnormal results of liver function studies: Secondary | ICD-10-CM | POA: Diagnosis not present

## 2018-05-15 DIAGNOSIS — K828 Other specified diseases of gallbladder: Secondary | ICD-10-CM | POA: Diagnosis not present

## 2018-05-15 DIAGNOSIS — K449 Diaphragmatic hernia without obstruction or gangrene: Secondary | ICD-10-CM | POA: Diagnosis not present

## 2018-05-16 ENCOUNTER — Telehealth: Payer: Self-pay

## 2018-05-16 NOTE — Telephone Encounter (Signed)
   [facility] Pre-operative Risk Assessment    Request for surgical clearance:  1. What type of surgery is being performed? Hiatal hernia and gallbladder  2. When is this surgery scheduled? TBD  3. What type of clearance is required (medical clearance vs. Pharmacy clearance to hold med vs. Both)? Both  4. Are there any medications that need to be held prior to surgery and how long? Eliquis  5. Practice name and name of physician performing surgery?  [facility]  Dr.Hoxworth  6. What is your office phone number [phone]   7.   What is your office fax number [phone]  8.   Anesthesia type  General   Kathyrn Lass 05/16/2018, 1:36 PM  _________________________________________________________________   (provider comments below)

## 2018-05-16 NOTE — Telephone Encounter (Signed)
   Primary Cardiologist: Pixie Casino, MD  Chart reviewed as part of pre-operative protocol coverage. Patient continues to have intermittent afib despite on flecainide. Unable to tolerate maltaq.   She was in afib all day yesterday, minimally symptomatic. Dr. Curt Bears please provider recommendations regarding clearance. She is at high risk for stroke if we stop Eliquis given intermittent afib.   [place], [place] 05/16/2018, 2:35 PM

## 2018-05-17 ENCOUNTER — Ambulatory Visit (HOSPITAL_COMMUNITY): Payer: PPO | Admitting: Nurse Practitioner

## 2018-05-17 DIAGNOSIS — L821 Other seborrheic keratosis: Secondary | ICD-10-CM | POA: Diagnosis not present

## 2018-05-17 DIAGNOSIS — Z23 Encounter for immunization: Secondary | ICD-10-CM | POA: Diagnosis not present

## 2018-05-17 DIAGNOSIS — N3020 Other chronic cystitis without hematuria: Secondary | ICD-10-CM | POA: Diagnosis not present

## 2018-05-17 DIAGNOSIS — N3000 Acute cystitis without hematuria: Secondary | ICD-10-CM | POA: Diagnosis not present

## 2018-05-17 DIAGNOSIS — N3281 Overactive bladder: Secondary | ICD-10-CM | POA: Diagnosis not present

## 2018-05-17 NOTE — Telephone Encounter (Signed)
Will forward to requesting provider via Epic.

## 2018-05-17 NOTE — Telephone Encounter (Signed)
Intermediate risk for intermediate risk procedure. OK to hold eliquis per surgeon recommendations but would restart ASAP after procedure.

## 2018-05-21 ENCOUNTER — Telehealth: Payer: Self-pay | Admitting: Cardiology

## 2018-05-21 NOTE — Telephone Encounter (Signed)
  Patient would like to know where things stand with her medical clearance.

## 2018-05-21 NOTE — Telephone Encounter (Signed)
I re-sent recommendations.

## 2018-05-22 NOTE — Telephone Encounter (Signed)
I have manually fax over clearance notes to requesting surgeon's office.

## 2018-05-22 NOTE — Telephone Encounter (Signed)
  Patient is calling to let us know that as of today that [facility] is still saying they have not received the clearance. Can we paper fax to them?

## 2018-05-24 ENCOUNTER — Other Ambulatory Visit (INDEPENDENT_AMBULATORY_CARE_PROVIDER_SITE_OTHER): Payer: PPO

## 2018-05-24 ENCOUNTER — Other Ambulatory Visit: Payer: Self-pay

## 2018-05-24 DIAGNOSIS — D7589 Other specified diseases of blood and blood-forming organs: Secondary | ICD-10-CM

## 2018-05-24 LAB — CBC WITH DIFFERENTIAL/PLATELET
Basophils Absolute: 0.1 10*3/uL (ref 0.0–0.1)
Basophils Relative: 1.2 % (ref 0.0–3.0)
Eosinophils Absolute: 0.1 10*3/uL (ref 0.0–0.7)
Eosinophils Relative: 2.1 % (ref 0.0–5.0)
HCT: 43.8 % (ref 36.0–46.0)
Hemoglobin: 15 g/dL (ref 12.0–15.0)
Lymphocytes Relative: 31.4 % (ref 12.0–46.0)
Lymphs Abs: 1.9 10*3/uL (ref 0.7–4.0)
MCHC: 34.3 g/dL (ref 30.0–36.0)
MCV: 101.5 fl — ABNORMAL HIGH (ref 78.0–100.0)
Monocytes Absolute: 1 10*3/uL (ref 0.1–1.0)
Monocytes Relative: 16.1 % — ABNORMAL HIGH (ref 3.0–12.0)
NEUTROS ABS: 3.1 10*3/uL (ref 1.4–7.7)
Neutrophils Relative %: 49.2 % (ref 43.0–77.0)
PLATELETS: 258 10*3/uL (ref 150.0–400.0)
RBC: 4.32 Mil/uL (ref 3.87–5.11)
RDW: 12.6 % (ref 11.5–15.5)
WBC: 6.2 10*3/uL (ref 4.0–10.5)

## 2018-05-25 ENCOUNTER — Other Ambulatory Visit: Payer: Self-pay | Admitting: Internal Medicine

## 2018-05-25 NOTE — Telephone Encounter (Signed)
Rx(s) sent to pharmacy electronically.  

## 2018-06-01 ENCOUNTER — Other Ambulatory Visit: Payer: Self-pay | Admitting: Family Medicine

## 2018-06-01 ENCOUNTER — Other Ambulatory Visit: Payer: Self-pay | Admitting: Internal Medicine

## 2018-06-01 DIAGNOSIS — I4891 Unspecified atrial fibrillation: Secondary | ICD-10-CM

## 2018-07-19 ENCOUNTER — Other Ambulatory Visit: Payer: Self-pay

## 2018-07-19 ENCOUNTER — Encounter: Payer: Self-pay | Admitting: Family Medicine

## 2018-07-19 ENCOUNTER — Ambulatory Visit (INDEPENDENT_AMBULATORY_CARE_PROVIDER_SITE_OTHER): Payer: PPO | Admitting: Family Medicine

## 2018-07-19 ENCOUNTER — Ambulatory Visit: Payer: PPO | Admitting: Family Medicine

## 2018-07-19 ENCOUNTER — Telehealth: Payer: Self-pay | Admitting: *Deleted

## 2018-07-19 DIAGNOSIS — K219 Gastro-esophageal reflux disease without esophagitis: Secondary | ICD-10-CM | POA: Diagnosis not present

## 2018-07-19 DIAGNOSIS — M47817 Spondylosis without myelopathy or radiculopathy, lumbosacral region: Secondary | ICD-10-CM | POA: Diagnosis not present

## 2018-07-19 DIAGNOSIS — Z8679 Personal history of other diseases of the circulatory system: Secondary | ICD-10-CM | POA: Diagnosis not present

## 2018-07-19 DIAGNOSIS — N3281 Overactive bladder: Secondary | ICD-10-CM | POA: Diagnosis not present

## 2018-07-19 DIAGNOSIS — I480 Paroxysmal atrial fibrillation: Secondary | ICD-10-CM | POA: Diagnosis not present

## 2018-07-19 NOTE — Progress Notes (Addendum)
Virtual Visit via Video Note  I connected with [name]  on 07/19/18 at 10:40 AM EDT by a video enabled telemedicine application and verified that I am speaking with the correct person using two identifiers.  Location patient: home Location provider:work or home office - in [place] Persons participating in the virtual visit: patient, provider, son in law and grandkids  I discussed the limitations of evaluation and management by telemedicine and the availability of in person appointments. The patient expressed understanding and agreed to proceed.   HPI:  [name] is a pleasant 68 y.o. here for follow up. Chronic medical problems summarized below were reviewed for changes and stability and were updated as needed below. These issues and their treatment remain stable for the most part.  Ablation and hiatal hernia repair have been postponed in light of the [place] pandemic. But, she feels good. Reports has had much less issues with her heart arrhythmia. Exercising daily with walking and taking care of grandkids. No CP, SOB, DOE or palpitations with activity. Has appt with dermatology in the summer. Back pain has been ok. Denies CP, SOB, DOE, treatment intolerance or new symptoms.   AWV 03/20/18  A. Fib: -treatment resistant, seeing Cardiology for management -Dr. Camitz/Allred -eliquis, diltiazem, flecainide, spironolactone  Morbid obesity: -successful with low carb diets in the past in terms of wt reduction, but has trouble sticking with it  GERD/Hx Transaminitis/FH Colon Ca/hiatal hernia/Chronic constipation: -meds: protonix -had US liver 02/2018 - mild GB sludge, seeing Dr. Harlow Asa about this  Recurrent UTIs/OOB: -sees Dr. Louis Meckel, urology  Chronic Low back pain: -hx significant DDD, has seeing Dr. 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 Harlow Asa for management -s/p biopsy in 2018 -[place] in 2019 stable  Hx of Melanoma: -sees dermatology for management/survaillence  ROS: See pertinent positives and negatives per HPI.  Past Medical History:  Diagnosis Date  . Atrial fibrillation ([place])   . Constipation   . Difficult intubation    pt states that her neck needs to be in a neutral position,  scoliosis  . GERD (gastroesophageal reflux disease)    protonix, hx h. pylori  . History of uterine cancer 04/22/2014   sees Dr. Ronita Hipps; s/p complete hysterectomy  . Hx of colonic polyp   . 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 Melanoma [facility])    sees Dr. Renda Rolls in dermatology  . Peripheral vascular disease ([place])   . PONV (postoperative nausea and vomiting)   . Recurrent UTI   . S/P hip replacement   . Scoliosis   . Thyroid nodule   . Urinary incontinence   . Venous insufficiency    s/p ablation    Past Surgical History:  Procedure Laterality Date  . ABDOMINAL HYSTERECTOMY  09/10/2013  . BUNIONECTOMY  10/1976  . COLONOSCOPY    . FRACTURE SURGERY  09/1964  . MELANOMA EXCISION  12/2011  . TOTAL HIP ARTHROPLASTY Left 06/17/2014   Procedure: LEFT TOTAL HIP ARTHROPLASTY ANTERIOR APPROACH;  Surgeon: Mcarthur Rossetti, MD;  Location: [place];  Service: Orthopedics;  Laterality: Left;  . TOTAL HIP ARTHROPLASTY Right 09/02/2014   Procedure: RIGHT TOTAL HIP ARTHROPLASTY ANTERIOR APPROACH;  Surgeon: Mcarthur Rossetti, MD;  Location: [place];  Service: Orthopedics;  Laterality: Right;  . VEIN SURGERY  05/2011   venous ablation  Family History  Problem Relation Age of Onset  . Uterine cancer Mother   . Diabetes Brother 91  . Hypertension Father 35  . Hyperlipidemia Father   . Cancer Father 74  . Epilepsy Father 46  . Arthritis Sister 59  . Cancer Maternal Grandmother   . Other Paternal Grandmother        influenza   . Stroke Paternal Grandfather   .  Arthritis Sister     SOCIAL HX: see hpi    Current Outpatient Medications:  .  Cranberry 500 MG TABS, Take 1,000 mg by mouth 2 (two) times daily., Disp: , Rfl:  .  cyclobenzaprine (FLEXERIL) 5 MG tablet, Take 1 tablet (5 mg total) by mouth 3 (three) times daily as needed for muscle spasms., Disp: 20 tablet, Rfl: 0 .  diltiazem (CARDIZEM) 60 MG tablet, Take 60 mg daily if needed for palpitations, Disp: 30 tablet, Rfl: 9 .  diltiazem (TIAZAC) 240 MG 24 hr capsule, TAKE 1 CAPSULE BY MOUTH EVERY DAY, Disp: 90 capsule, Rfl: 3 .  ELIQUIS 5 MG TABS tablet, TAKE 1 TABLET(5 MG) BY MOUTH TWICE DAILY, Disp: 180 tablet, Rfl: 1 .  flecainide (TAMBOCOR) 100 MG tablet, Take 1 tablet (100 mg total) by mouth 2 (two) times daily., Disp: 180 tablet, Rfl: 3 .  methenamine (MANDELAMINE) 1 g tablet, Take 500 mg by mouth 2 (two) times daily. , Disp: , Rfl:  .  metoprolol tartrate (LOPRESSOR) 100 MG tablet, Take 100 mg (1 tablet) two hours prior to your CT test, Disp: 1 tablet, Rfl: 0 .  mirabegron ER (MYRBETRIQ) 50 MG TB24 tablet, Take 25 mg by mouth every other day. , Disp: , Rfl:  .  pantoprazole (PROTONIX) 40 MG tablet, TAKE 1 TABLET(40 MG) BY MOUTH DAILY, Disp: 90 tablet, Rfl: 1 .  polyethylene glycol (MIRALAX / GLYCOLAX) packet, Take 17 g by mouth daily. (Patient taking differently: Take 17 g by mouth daily as needed for moderate constipation. ), Disp: 14 each, Rfl: 0 .  senna-docusate (SENOKOT-S) 8.6-50 MG tablet, Take 1 tablet by mouth at bedtime. (Patient taking differently: Take 1 tablet by mouth at bedtime as needed. ), Disp: 30 tablet, Rfl: 0 .  spironolactone (ALDACTONE) 50 MG tablet, Take 2 tablets (100 mg total) by mouth every morning AND 1 tablet (50 mg total) every evening., Disp: 270 tablet, Rfl: 3 .  triamcinolone cream (KENALOG) 0.1 %, Apply 1 application topically as needed (for dry skin). , Disp: , Rfl: 1 .  vitamin C (ASCORBIC ACID) 500 MG tablet, Take 500 mg by mouth 2 (two) times daily. , Disp:  , Rfl:   EXAM:  VITALS per patient if applicable: There were no vitals filed for this visit.   GENERAL: alert, oriented, appears well and in no acute distress  HEENT: atraumatic, conjunttiva clear, no obvious abnormalities on inspection of external nose and ears  NECK: normal movements of the head and neck  LUNGS: on inspection no signs of respiratory distress, breathing rate appears normal, no obvious gross SOB, gasping or wheezing  CV: no obvious cyanosis  MS: moves all visible extremities without noticeable abnormality  PSYCH/NEURO: pleasant and cooperative, no obvious depression or anxiety, speech and thought processing grossly intact  ASSESSMENT AND PLAN:  Discussed the following assessment and plan:  Gastroesophageal reflux disease, esophagitis presence not specified  OAB (overactive bladder)  Hx of essential hypertension  Paroxysmal atrial fibrillation (HCC)  Lumbar and sacral osteoarthritis  Stable and improved in terms of heart symptoms.  Sees cardiology. Plans to do hiatal hernia surgery and perhaps ablation once procedures are back open (currenlty postponed in light of the COVID19 pandemic.) Continur regular exercise and healthy diet.   I discussed the assessment and treatment plan with the patient. The patient was provided an opportunity to ask questions and all were answered. The patient agreed with the plan and demonstrated an understanding of the instructions.   The patient was advised to call back or seek an in-person evaluation if any concerns.   Follow up instructions: Advised assistant Wendie Simmer to help patient arrange the following: -vitals in chart: wt, height, BP, pulse, etc -meet and greet with Dr. Ethlyn Gallery in 3-4 months -AWV with dr. Maudie Mercury in January  [place], DO

## 2018-07-19 NOTE — Telephone Encounter (Signed)
Per office notes from 5/7, I left a detailed message for the pt to call the office to schedule a transfer of care visit with Dr Ethlyn Gallery and an AWV with Dr Maudie Mercury.

## 2018-07-24 ENCOUNTER — Ambulatory Visit: Payer: PPO | Admitting: Cardiology

## 2018-08-02 ENCOUNTER — Ambulatory Visit: Payer: PPO | Admitting: Cardiology

## 2018-09-20 ENCOUNTER — Telehealth: Payer: Self-pay | Admitting: Cardiology

## 2018-09-20 NOTE — Telephone Encounter (Signed)
New Message ° ° °Patient returning your call. °

## 2018-09-21 NOTE — Telephone Encounter (Signed)
Pt reports doing well.  She has not had hernia repair yet d/t COVID. Reports AFib has been fairly quiet only couple of incidents. Pt scheduled for 8/4 in-office visit to follow up on AFib. She understands we will re-address need, if any, for ablation.  If AFib is "quiet" and controlled and not giving her issues, then may be best to keep monitoring and re-address ablation at another time.  Pt voices that she agrees with this and not really interested in going into hospital anytime soon. Patient verbalized understanding and agreeable to plan.

## 2018-10-09 MED ORDER — FLECAINIDE ACETATE 100 MG PO TABS: 150.0000 mg | ORAL_TABLET | Freq: Two times a day (BID) | ORAL | 3 refills | Status: DC

## 2018-10-09 NOTE — Telephone Encounter (Signed)
Pt advised to increase Flecainide to 150 mg BID. Informed to keep next weeks OV appt and we will obtain an EKG at that visit. Patient verbalized understanding and agreeable to plan.

## 2018-10-15 ENCOUNTER — Other Ambulatory Visit: Payer: Self-pay | Admitting: Family Medicine

## 2018-10-16 ENCOUNTER — Encounter: Payer: Self-pay | Admitting: Cardiology

## 2018-10-16 ENCOUNTER — Other Ambulatory Visit: Payer: Self-pay

## 2018-10-16 ENCOUNTER — Ambulatory Visit: Payer: PPO | Admitting: Cardiology

## 2018-10-16 VITALS — BP 122/64 | HR 62 | Ht 68.5 in | Wt 227.0 lb

## 2018-10-16 DIAGNOSIS — I480 Paroxysmal atrial fibrillation: Secondary | ICD-10-CM

## 2018-10-16 MED ORDER — FLECAINIDE ACETATE 100 MG PO TABS: 150.0000 mg | ORAL_TABLET | Freq: Two times a day (BID) | ORAL | 6 refills | Status: DC

## 2018-10-16 NOTE — Addendum Note (Signed)
Addended by: Stanton Kidney on: 10/16/2018 03:42 PM   Modules accepted: Orders

## 2018-10-16 NOTE — Telephone Encounter (Signed)
Rx done. 

## 2018-10-16 NOTE — Progress Notes (Signed)
Electrophysiology Office Note   Date:  10/16/2018   ID:  AMANAT HACKEL, DOB 1950/11/10, MRN [id number]  PCP:  Lucretia Kern, DO  Cardiologist: [facility] Primary Electrophysiologist:  Dr Curt Bears    CC: Evaluation for atrial fibrillation   History of Present Illness: [name] is a 68 y.o. female who is being seen today for the evaluation of atrial fibrillation at the request of Lucretia Kern, DO. Presenting today for electrophysiology evaluation.  Her atrial fibrillation was initially noted in 2012 and initially controlled with diltiazem.  She was admitted in December 2019 with multifocal pneumonia and acute onset of atrial fibrillation with rapid rates.  Heart rates were as high as the 180s despite being on flecainide and Cardizem.  She spontaneously converted to sinus rhythm.  She has had multiple episodes of tachycardia at home since that time. She is symptomatic with chest tension, shortness of breath, and lightheadedness. She did have one syncopal episode going from sitting to standing, she reports that she was not aware of any atrial fibrillation at the time.    Today, denies symptoms of palpitations, chest pain, shortness of breath, orthopnea, PND, lower extremity edema, claudication, dizziness, presyncope, syncope, bleeding, or neurologic sequela. The patient is tolerating medications without difficulties.  She continues to do well.  Her flecainide dose was increased and she has had minimal atrial fibrillation episodes last up to 1 minute at the time.  Otherwise she is doing well without complaint.   Past Medical History:  Diagnosis Date  . Atrial fibrillation ([place])   . Constipation   . Difficult intubation    pt states that her neck needs to be in a neutral position,  scoliosis  . GERD (gastroesophageal reflux disease)    protonix, hx h. pylori  . History of uterine cancer 04/22/2014   sees Dr. Ronita Hipps; s/p complete hysterectomy  . Hx of colonic polyp   . Lumbar and sacral  osteoarthritis 12/10/2013  . Melanoma [facility])    sees Dr. Renda Rolls in dermatology  . Peripheral vascular disease ([place])   . PONV (postoperative nausea and vomiting)   . Recurrent UTI   . S/P hip replacement   . Scoliosis   . Thyroid nodule   . Urinary incontinence   . Venous insufficiency    s/p ablation   Past Surgical History:  Procedure Laterality Date  . ABDOMINAL HYSTERECTOMY  09/10/2013  . BUNIONECTOMY  10/1976  . COLONOSCOPY    . FRACTURE SURGERY  09/1964  . MELANOMA EXCISION  12/2011  . TOTAL HIP ARTHROPLASTY Left 06/17/2014   Procedure: LEFT TOTAL HIP ARTHROPLASTY ANTERIOR APPROACH;  Surgeon: Mcarthur Rossetti, MD;  Location: [place];  Service: Orthopedics;  Laterality: Left;  . TOTAL HIP ARTHROPLASTY Right 09/02/2014   Procedure: RIGHT TOTAL HIP ARTHROPLASTY ANTERIOR APPROACH;  Surgeon: Mcarthur Rossetti, MD;  Location: [place];  Service: Orthopedics;  Laterality: Right;  . VEIN SURGERY  05/2011   venous ablation      Current Outpatient Medications  Medication Sig Dispense Refill  . Cranberry 500 MG TABS Take 1,000 mg by mouth 2 (two) times daily.    . cyclobenzaprine (FLEXERIL) 5 MG tablet Take 1 tablet (5 mg total) by mouth 3 (three) times daily as needed for muscle spasms. 20 tablet 0  . diltiazem (CARDIZEM) 60 MG tablet Take 60 mg daily if needed for palpitations 30 tablet 9  . diltiazem (TIAZAC) 240 MG 24 hr capsule TAKE 1 CAPSULE BY MOUTH EVERY DAY 90  capsule 3  . ELIQUIS 5 MG TABS tablet TAKE 1 TABLET(5 MG) BY MOUTH TWICE DAILY 180 tablet 1  . flecainide (TAMBOCOR) 100 MG tablet Take 1.5 tablets (150 mg total) by mouth 2 (two) times daily. 90 tablet 3  . methenamine (MANDELAMINE) 1 g tablet Take 500 mg by mouth 2 (two) times daily.     . metoprolol tartrate (LOPRESSOR) 100 MG tablet Take 100 mg (1 tablet) two hours prior to your CT test 1 tablet 0  . mirabegron ER (MYRBETRIQ) 50 MG TB24 tablet Take 25 mg by mouth every other day.     . pantoprazole (PROTONIX) 40  MG tablet TAKE 1 TABLET(40 MG) BY MOUTH DAILY 90 tablet 0  . polyethylene glycol (MIRALAX / GLYCOLAX) packet Take 17 g by mouth daily. (Patient taking differently: Take 17 g by mouth daily as needed for moderate constipation. ) 14 each 0  . senna-docusate (SENOKOT-S) 8.6-50 MG tablet Take 1 tablet by mouth at bedtime. (Patient taking differently: Take 1 tablet by mouth at bedtime as needed. ) 30 tablet 0  . spironolactone (ALDACTONE) 50 MG tablet Take 2 tablets (100 mg total) by mouth every morning AND 1 tablet (50 mg total) every evening. 270 tablet 3  . triamcinolone cream (KENALOG) 0.1 % Apply 1 application topically as needed (for dry skin).   1  . vitamin C (ASCORBIC ACID) 500 MG tablet Take 500 mg by mouth 2 (two) times daily.      No current facility-administered medications for this visit.     Allergies:   Hydrolyzed silk, Ciprofloxacin, and Gluten meal   Social History:  The patient  reports that she has never smoked. She has never used smokeless tobacco. She reports current alcohol use. She reports that she does not use drugs.   Family History:  The patient's family history includes Arthritis in her sister; Arthritis (age of onset: 28) in her sister; Cancer in her maternal grandmother; Cancer (age of onset: 13) in her father; Diabetes (age of onset: 75) in her brother; Epilepsy (age of onset: 71) in her father; Hyperlipidemia in her father; Hypertension (age of onset: 74) in her father; Other in her paternal grandmother; Stroke in her paternal grandfather; Uterine cancer in her mother.    ROS:  Please see the history of present illness.   Otherwise, review of systems is positive for none.   All other systems are reviewed and negative.   PHYSICAL EXAM: VS:  BP 122/64   Pulse 62   Ht 5' 8.5" (1.74 m)   Wt 227 lb (103 kg)   BMI 34.01 kg/m  , BMI Body mass index is 34.01 kg/m. GEN: Well nourished, well developed, in no acute distress  HEENT: normal  Neck: no JVD, carotid bruits, or  masses Cardiac: RRR; no murmurs, rubs, or gallops,no edema  Respiratory:  clear to auscultation bilaterally, normal work of breathing GI: soft, nontender, nondistended, + BS MS: no deformity or atrophy  Skin: warm and dry Neuro:  Strength and sensation are intact Psych: euthymic mood, full affect  EKG:  EKG is ordered today. Personal review of the ekg ordered shows this rhythm, first-degree AV block, IVCD  Recent Labs: 02/10/2018: B Natriuretic Peptide 207.0 02/12/2018: Magnesium 2.1 03/20/2018: ALT 16 03/29/2018: BUN 24; Creatinine, Ser 0.81; Potassium 4.8; Sodium 138 05/24/2018: Hemoglobin 15.0; Platelets 258.0    Lipid Panel     Component Value Date/Time   CHOL 180 03/20/2018 1117   TRIG 53.0 09/07/2015 1211   HDL  45.20 03/20/2018 1117   CHOLHDL 3 09/07/2015 1211   VLDL 10.6 09/07/2015 1211   LDLCALC 87 09/07/2015 1211     Wt Readings from Last 3 Encounters:  10/16/18 227 lb (103 kg)  05/07/18 231 lb 4 oz (104.9 kg)  03/22/18 236 lb (107 kg)      Other studies Reviewed: Additional studies/ records that were reviewed today include: TTE 08/25/16  Review of the above records today demonstrates:  - Left ventricle: The cavity size was normal. Wall thickness was   increased in a pattern of mild LVH. Doppler parameters are   consistent with abnormal left ventricular relaxation (grade 1   diastolic dysfunction). - Left atrium:  The atrium was normal in size.   Myoview 08/25/17  Nuclear stress EF: 66%.  There was no ST segment deviation noted during stress.  The study is normal.  This is a low risk study.  The left ventricular ejection fraction is hyperdynamic (>65%).   Breast attenuation no ischemia or infarct EF 66%  ASSESSMENT AND PLAN:  1.  Paroxysmal atrial fibrillation/typical atrial flutter: He on Eliquis, flecainide, diltiazem.  She was initially planned to have an atrial fibrillation ablation, though she was found to have a massive hiatal hernia and had  followed up with surgery.  She has surgery appointment scheduled later this week.  She would like to have an operation for her hernia around Christmas time.  Quinesha Selinger reevaluate for potential atrial fibrillation ablation at that point.  This patients CHA2DS2-VASc Score and unadjusted Ischemic Stroke Rate (% per year) is equal to 3.2 % stroke rate/year from a score of 3  Above score calculated as 1 point each if present [CHF, HTN, DM, Vascular=MI/PAD/Aortic Plaque, Age if 65-74, or Female] Above score calculated as 2 points each if present [Age > 75, or Stroke/TIA/TE]   Current medicines are reviewed at length with the patient today.   The patient does not have concerns regarding her medicines.  The following changes were made today:  none  Labs/ tests ordered today include:  Orders Placed This Encounter  Procedures  . EKG 12-Lead     Disposition:  Elissa Grieshop schedule afib/flutter ablation. FU with Afib clinic and Kaelin Holford post procedure.  Signed, Ahad Colarusso Meredith Leeds, MD  10/16/2018 3:37 PM     [address] [place] [address] [phone] (office) [phone] (fax)

## 2018-10-16 NOTE — Patient Instructions (Signed)
Medication Instructions:  Your physician recommends that you continue on your current medications as directed. Please refer to the Current Medication list given to you today.  If you need a refill on your cardiac medications before your next appointment, please call your pharmacy.   Labwork: None ordered  Testing/Procedures: None ordered  Follow-Up: Your physician wants you to follow-up in: 6 months with Dr. Camnitz.  You will receive a reminder letter in the mail two months in advance. If you don't receive a letter, please call our office to schedule the follow-up appointment.  Thank you for choosing [facility]!!   Saleah Rishel, RN [phone]         

## 2018-10-19 ENCOUNTER — Other Ambulatory Visit: Payer: Self-pay | Admitting: Family Medicine

## 2018-10-19 DIAGNOSIS — K828 Other specified diseases of gallbladder: Secondary | ICD-10-CM | POA: Diagnosis not present

## 2018-10-19 DIAGNOSIS — K449 Diaphragmatic hernia without obstruction or gangrene: Secondary | ICD-10-CM | POA: Diagnosis not present

## 2018-10-22 ENCOUNTER — Other Ambulatory Visit: Payer: Self-pay

## 2018-10-22 MED ORDER — FLECAINIDE ACETATE 100 MG PO TABS: 150.0000 mg | ORAL_TABLET | Freq: Two times a day (BID) | ORAL | 6 refills | Status: DC

## 2018-11-01 DIAGNOSIS — L821 Other seborrheic keratosis: Secondary | ICD-10-CM | POA: Diagnosis not present

## 2018-11-01 DIAGNOSIS — Z85820 Personal history of malignant melanoma of skin: Secondary | ICD-10-CM | POA: Diagnosis not present

## 2018-11-01 DIAGNOSIS — L814 Other melanin hyperpigmentation: Secondary | ICD-10-CM | POA: Diagnosis not present

## 2018-11-01 DIAGNOSIS — D225 Melanocytic nevi of trunk: Secondary | ICD-10-CM | POA: Diagnosis not present

## 2019-01-04 ENCOUNTER — Ambulatory Visit: Payer: Self-pay | Admitting: Surgery

## 2019-01-12 ENCOUNTER — Other Ambulatory Visit: Payer: Self-pay | Admitting: Internal Medicine

## 2019-01-12 DIAGNOSIS — I4891 Unspecified atrial fibrillation: Secondary | ICD-10-CM

## 2019-01-29 DIAGNOSIS — N3020 Other chronic cystitis without hematuria: Secondary | ICD-10-CM | POA: Diagnosis not present

## 2019-01-29 DIAGNOSIS — N3281 Overactive bladder: Secondary | ICD-10-CM | POA: Diagnosis not present

## 2019-02-27 IMAGING — US US THYROID
1 series · 12 of 25 positions shown · non-contrast
Comparison: 03/17/2016;;

CLINICAL DATA: Prior ultrasound follow-up. History of biopsy of
both right-sided thyroid nodules in 1794.

EXAM:
THYROID ULTRASOUND
TECHNIQUE: Ultrasound examination of the thyroid gland and adjacent soft
tissues was performed.

[Series 1: us thyroid · 0.05mm/px · 66 acquisitions, 12 frames shown]
[im 3/66]
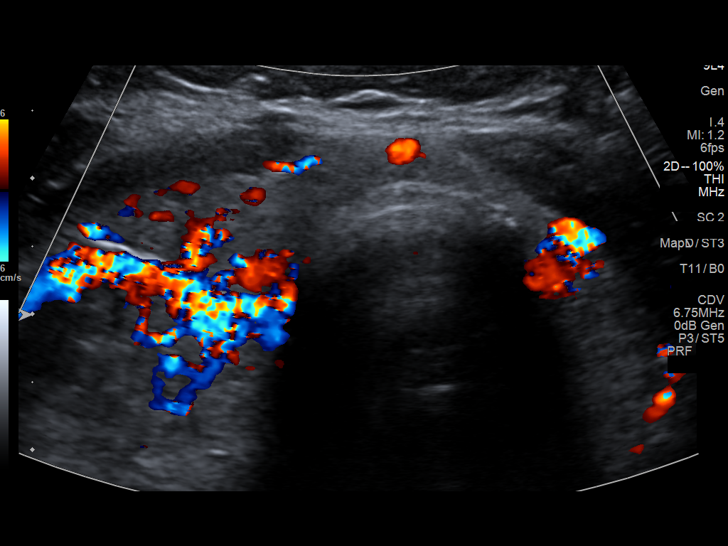
[im 9/66]
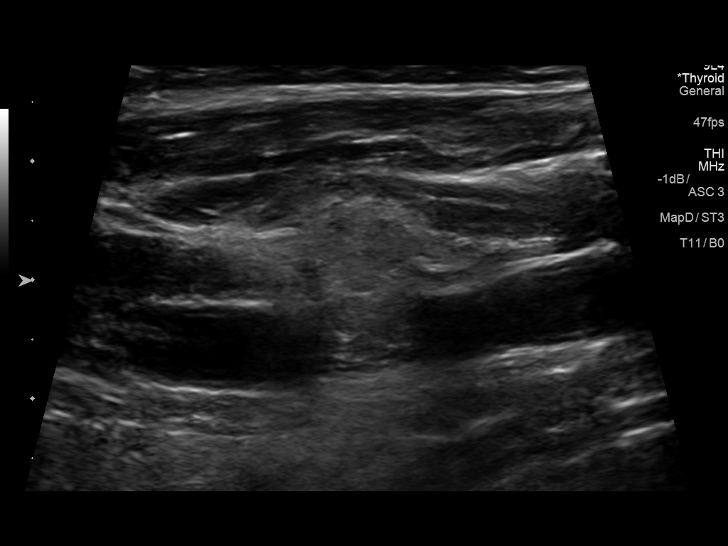
[im 14/66]
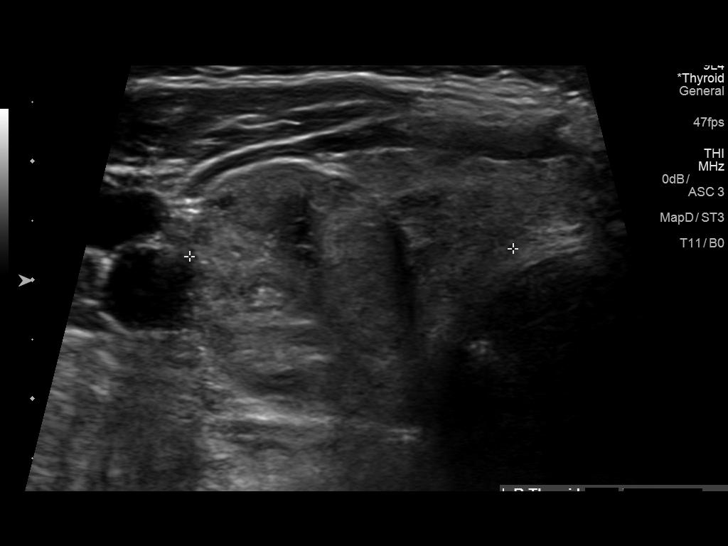
[im 19/66]
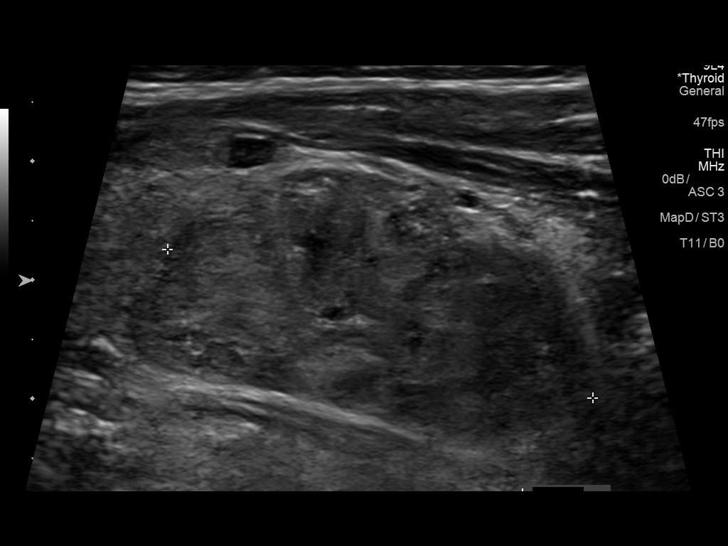
[im 25/66]
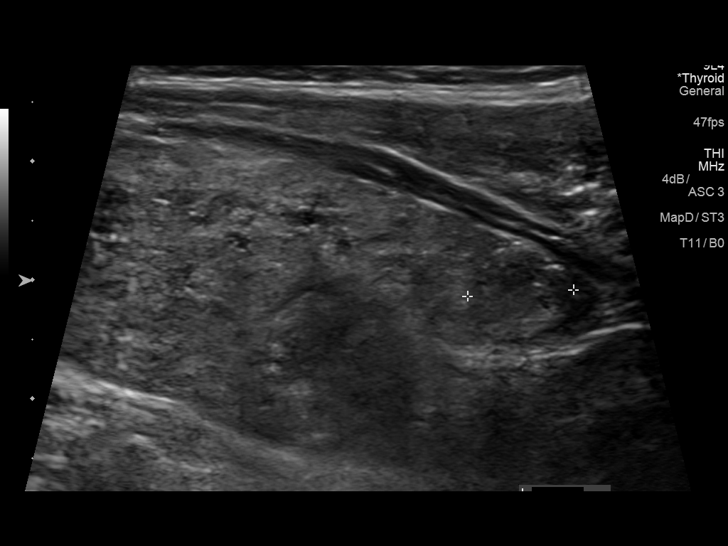
[im 30/66]
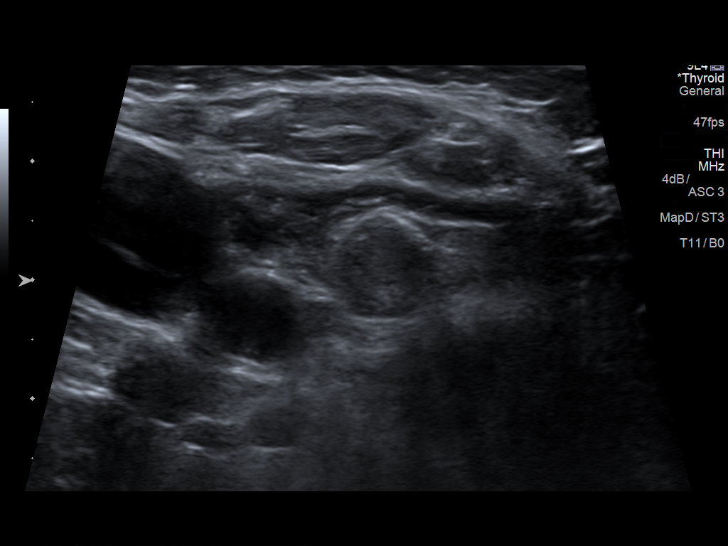
[im 36/66]
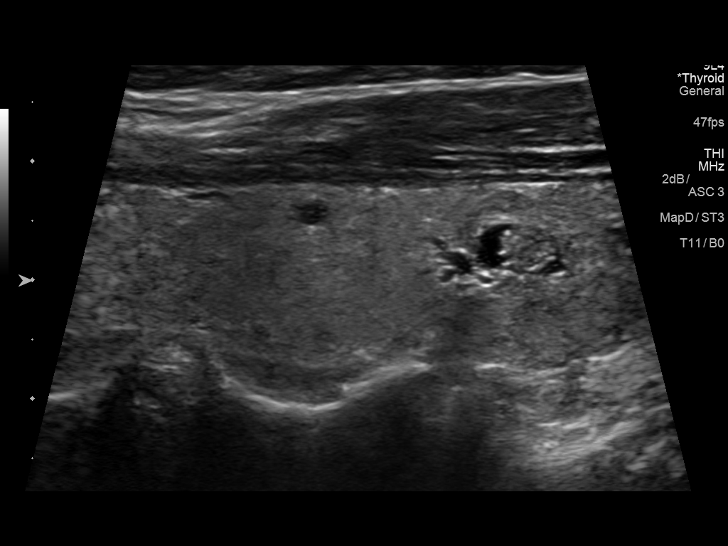
[im 41/66]
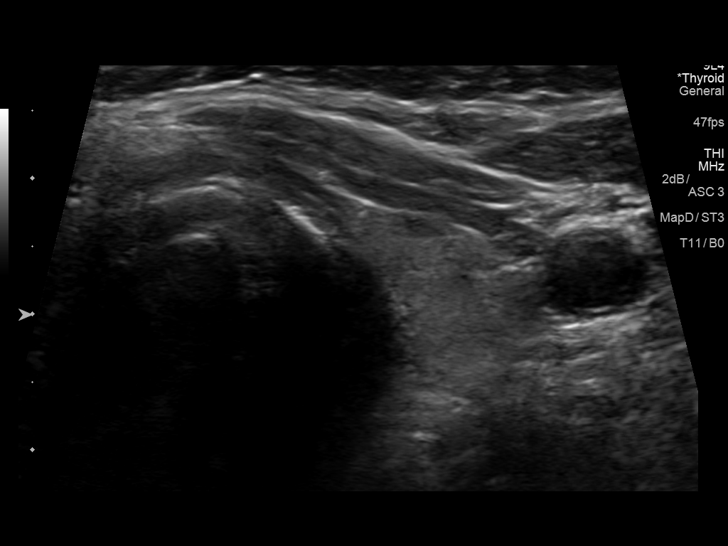
[im 47/66]
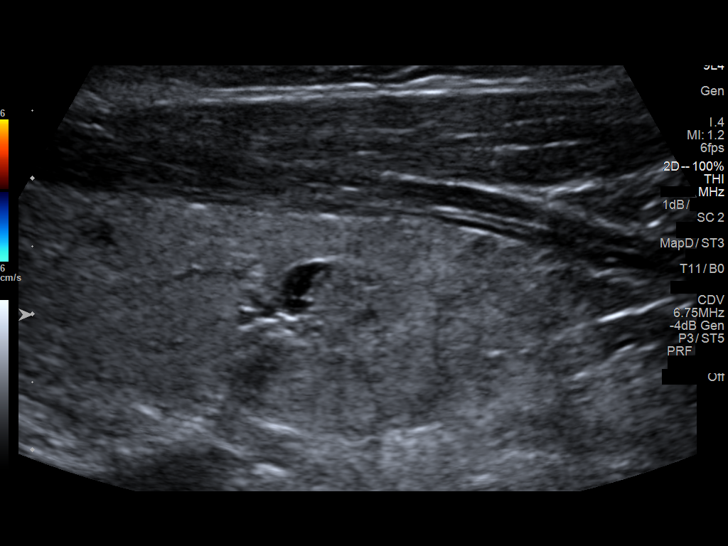
[im 52/66]
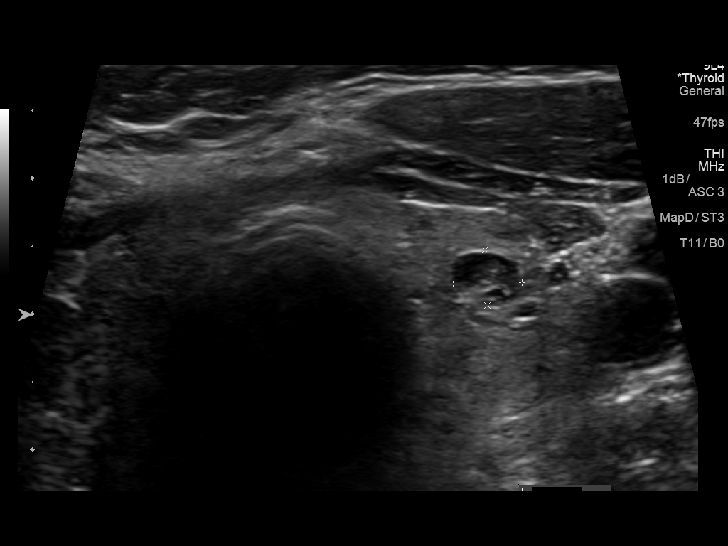
[im 57/66]
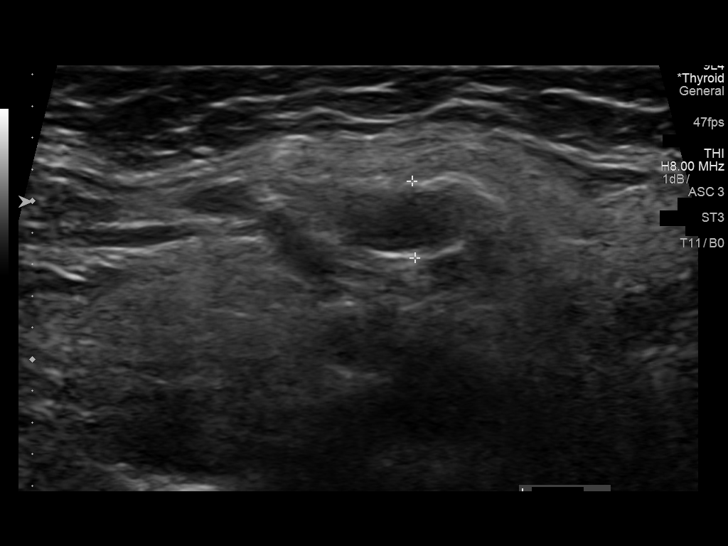
[im 63/66]
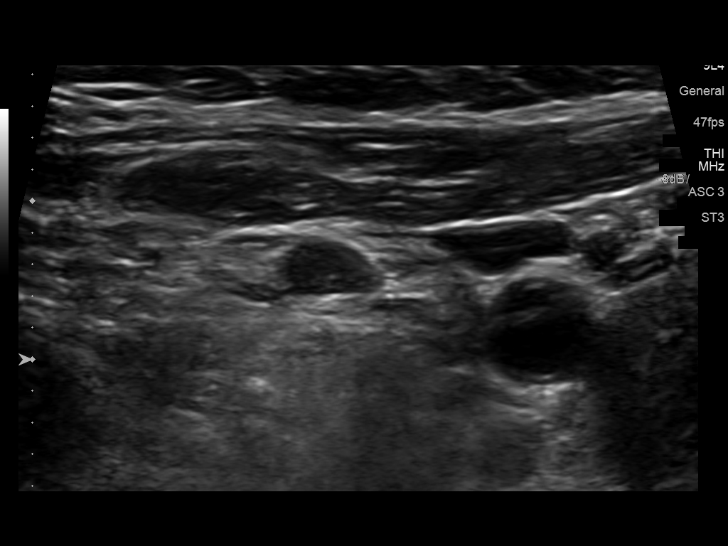

[12 of 25 positions shown; findings below may reference images not displayed]

03/17/2016; 01/02/2015 ultrasound-guided
right-sided thyroid nodule fine-needle aspiration - 04/19/2016
FINDINGS: Parenchymal Echotexture: Mildly heterogenous

Isthmus: Normal in size measures 0.3 cm in diameter, unchanged

Right lobe: Normal in size measuring 7.2 x 2.1 x 2.7 cm, unchanged,
previously, 6.4 x 2.5 x 2.3 cm

Left lobe: Normal in size measuring 6.6 x 1.6 x 1.2 cm, unchanged,
previously, 5.6 x 2.1 x 1.4 cm

_________________________________________________________

Estimated total number of nodules >/= 1 cm: 2

Number of spongiform nodules >/=  2 cm not described below (TR1): 0

Number of mixed cystic and solid nodules >/= 1.5 cm not described
below (TR2): 0

_________________________________________________________

The previously biopsied approximately 2.6 x 1.9 x 1.9 cm nodule
(labeled 1) within the right lobe of the thyroid is grossly
unchanged, previously, 2.6 x 2.0 x 2.0 cm. Correlation with prior
biopsy results is recommended.

The previously biopsied approximately 1.4 x 1.3 x 1.2 cm hypoechoic
nodule (not labeled on the present examination) is grossly unchanged
compared to the [DATE] examination, previously, 1.6 x 1.6 x 1.5 cm.
Correlation with prior biopsy results is recommended.

The questioned punctate (approximately 1 cm) isoechoic nodule within
the superior pole the right lobe of the thyroid (previously labeled
1) is not definitely seen on the present examination and thus
favored to have represented a pseudo nodule.

The punctate (approximately 1.0 cm nodule/pseudo nodule (labeled 2)
within the inferior pole the right lobe of the thyroid is unchanged
compared to the [DATE] examination and does not meet imaging
criteria to recommend percutaneous sampling or continued dedicated
follow-up.

Punctate (approximately 0.7 x 0.5 x 0.4 cm) partially cystic though
predominantly solid nodule (labeled 3) within the inferior aspect
the left lobe of the thyroid does not meet imaging criteria to
recommend percutaneous sampling or continued dedicated follow-up.
IMPRESSION: 1. Similar findings of multinodular goiter. No definitive worrisome
new or enlarging thyroid nodule.
2. Previously biopsied right-sided thyroid nodules are unchanged
compared to the [DATE] examination. Correlation with prior biopsy
results is recommended. Assuming a benign pathologic diagnosis,
repeat sampling and/or continued dedicated follow-up is not
recommended.
3. The questioned nodule within the superior pole the right lobe of
the thyroid (previously labeled #1) for which a 1 year surveillance
ultrasound was previously recommended is NOT seen on the present
examination and thus favored to have represented a pseudo nodule.

The above is in keeping with the ACR TI-RADS recommendations - [HOSPITAL] 1740;[DATE].

## 2019-03-01 ENCOUNTER — Encounter (HOSPITAL_COMMUNITY): Payer: Self-pay

## 2019-03-01 NOTE — Patient Instructions (Addendum)
DUE TO COVID-19 ONLY ONE VISITOR IS ALLOWED TO COME WITH YOU AND STAY IN THE WAITING ROOM ONLY DURING PRE OP AND PROCEDURE. THE ONE VISITOR MAY VISIT WITH YOU IN YOUR PRIVATE ROOM DURING VISITING HOURS ONLY!!   COVID SWAB TESTING MUST BE COMPLETED ON:  Saturday, Dec. 26, 2020 at 10:45 AM    [address], [place] [facility] pre surgical testing line (Must self quarantine after testing. Follow instructions on handout.)             Your procedure is scheduled on: Tuesday, Dec. 29, 2020   Report to [facility] Main  Entrance    Report to admitting at 11:00 AM   Call this number if you have problems the morning of surgery [phone]   Do not eat food:After Midnight. May have clear liquids from midnight up until 0700 am then nothing until after surgery.    CLEAR LIQUID DIET   Foods Allowed                                                                     Foods Excluded  Coffee and tea, regular and decaf                             liquids that you cannot  Plain Jell-O any favor except red or purple                                           see through such as: Fruit ices (not with fruit pulp)                                     milk, soups, orange juice  Iced Popsicles                                    All solid food Carbonated beverages, regular and diet                                    Cranberry, grape and apple juices Sports drinks like Gatorade Lightly seasoned clear broth or consume(fat free) Sugar, honey syrup  Sample Menu Breakfast                                Lunch                                     Supper Cranberry juice                    Beef broth  Chicken broth Jell-O                                     Grape juice                           Apple juice Coffee or tea                        Jell-O                                      Popsicle                                                Coffee  or tea                        Coffee or tea  _____________________________________________________________________    Brush your teeth the morning of surgery.   Do NOT smoke after Midnight   Take these medicines the morning of surgery with A SIP OF WATER: Flecainide (Tambocor), Diltiazem (Tiazac), and Pantoprazole (Protonix), Methenamine (Mandelamine)                               You may not have any metal on your body including hair pins, jewelry, and body piercings             Do not wear make-up, lotions, powders, perfumes/cologne, or deodorant             Do not wear nail polish.  Do not shave  48 hours prior to surgery.               Do not bring valuables to the hospital. [place].   Contacts, dentures or bridgework may not be worn into surgery.   Bring small overnight bag day of surgery.   Special Instructions: Bring a copy of your healthcare power of attorney and living will documents         the day of surgery if you haven't scanned them in before.              Please read over the following fact sheets you were given:  [facility] - Preparing for Surgery Before surgery, you can play an important role.  Because skin is not sterile, your skin needs to be as free of germs as possible.  You can reduce the number of germs on your skin by washing with CHG (chlorahexidine gluconate) soap before surgery.  CHG is an antiseptic cleaner which kills germs and bonds with the skin to continue killing germs even after washing. Please DO NOT use if you have an allergy to CHG or antibacterial soaps.  If your skin becomes reddened/irritated stop using the CHG and inform your nurse when you arrive at Short Stay. Do not shave (including legs and underarms) for at least 48 hours prior to the first CHG shower.  You may shave your face/neck.  Please follow these instructions carefully:  1.  Shower with CHG Soap the night before surgery and the   morning of surgery.  2.  If you choose to wash your hair, wash your hair first as usual with your normal  shampoo.  3.  After you shampoo, rinse your hair and body thoroughly to remove the shampoo.                             4.  Use CHG as you would any other liquid soap.  You can apply chg directly to the skin and wash.  Gently with a scrungie or clean washcloth.  5.  Apply the CHG Soap to your body ONLY FROM THE NECK DOWN.   Do   not use on face/ open                           Wound or open sores. Avoid contact with eyes, ears mouth and   genitals (private parts).                       Wash face,  Genitals (private parts) with your normal soap.             6.  Wash thoroughly, paying special attention to the area where your    surgery  will be performed.  7.  Thoroughly rinse your body with warm water from the neck down.  8.  DO NOT shower/wash with your normal soap after using and rinsing off the CHG Soap.                9.  Pat yourself dry with a clean towel.            10.  Wear clean pajamas.            11.  Place clean sheets on your bed the night of your first shower and do not  sleep with pets. Day of Surgery : Do not apply any lotions/deodorants the morning of surgery.  Please wear clean clothes to the hospital/surgery center.  FAILURE TO FOLLOW THESE INSTRUCTIONS MAY RESULT IN THE CANCELLATION OF YOUR SURGERY  PATIENT SIGNATURE_________________________________  NURSE SIGNATURE__________________________________  ________________________________________________________________________

## 2019-03-04 ENCOUNTER — Encounter (HOSPITAL_COMMUNITY)
Admission: RE | Admit: 2019-03-04 | Discharge: 2019-03-04 | Disposition: A | Discharge status: Home / Self Care | Payer: PPO | Source: Ambulatory Visit | Attending: Surgery | Admitting: Surgery

## 2019-03-04 ENCOUNTER — Encounter (HOSPITAL_COMMUNITY): Payer: Self-pay | Admitting: *Deleted

## 2019-03-04 ENCOUNTER — Other Ambulatory Visit: Payer: Self-pay

## 2019-03-04 HISTORY — DX: Obesity, unspecified: E66.9

## 2019-03-04 HISTORY — DX: Other cervical disc displacement, unspecified cervical region: M50.20

## 2019-03-04 HISTORY — DX: Malignant neoplasm of uterus, part unspecified: C55

## 2019-03-04 HISTORY — DX: Paroxysmal atrial fibrillation: I48.0

## 2019-03-04 HISTORY — DX: Edema, unspecified: R60.9

## 2019-03-04 HISTORY — DX: Personal history of other diseases of the digestive system: Z87.19

## 2019-03-04 NOTE — Progress Notes (Signed)
PCP - Dr. Colin Benton Cardiologist - Dr. Lyman Bishop and Dr. Curt Bears  Chest x-ray - n/a EKG -10/16/2018  Stress Test - 08/25/2016 ECHO - 08/25/2016 Cardiac Cath - n/a  Sleep Study - n/a CPAP - n/a  Fasting Blood Sugar - n/a Checks Blood Sugar _0____ times a day  Blood Thinner Instructions:Eliquis  Patient instructed to Stop Eliquis Saturday before surgery- Aspirin Instructions:n/a Last Dose:03/08/2019  Anesthesia review:  Chart given to Konrad Felix, PA to review medical history.  Patient has a history of uterine cancer, Atrial Fibrillation, PVD, Melanoma excised, HTN, venous insufficiency (s/p ablation), also a difficult intubation (neck needs to be in a neutral position, scoliosis). Per note 10/16/2018- patient will schedule afib/flutter ablation and F/U with AFIB clinic and Dr. Curt Bears post-procedure.  Patient denies shortness of breath, fever, cough and chest pain at PAT appointment   Patient verbalized understanding of instructions that were given to them at the PAT appointment. Patient was also instructed that they will need to review over the PAT instructions again at home before surgery.

## 2019-03-06 ENCOUNTER — Encounter (HOSPITAL_COMMUNITY)
Admission: RE | Admit: 2019-03-06 | Discharge: 2019-03-06 | Disposition: A | Discharge status: Home / Self Care | Payer: PPO | Source: Ambulatory Visit | Attending: Surgery | Admitting: Surgery

## 2019-03-06 ENCOUNTER — Other Ambulatory Visit: Payer: Self-pay

## 2019-03-06 DIAGNOSIS — K449 Diaphragmatic hernia without obstruction or gangrene: Secondary | ICD-10-CM | POA: Insufficient documentation

## 2019-03-06 DIAGNOSIS — Z8542 Personal history of malignant neoplasm of other parts of uterus: Secondary | ICD-10-CM | POA: Insufficient documentation

## 2019-03-06 DIAGNOSIS — I480 Paroxysmal atrial fibrillation: Secondary | ICD-10-CM | POA: Diagnosis not present

## 2019-03-06 DIAGNOSIS — Z96643 Presence of artificial hip joint, bilateral: Secondary | ICD-10-CM | POA: Diagnosis not present

## 2019-03-06 DIAGNOSIS — I739 Peripheral vascular disease, unspecified: Secondary | ICD-10-CM | POA: Insufficient documentation

## 2019-03-06 DIAGNOSIS — Z79899 Other long term (current) drug therapy: Secondary | ICD-10-CM | POA: Insufficient documentation

## 2019-03-06 DIAGNOSIS — K219 Gastro-esophageal reflux disease without esophagitis: Secondary | ICD-10-CM | POA: Insufficient documentation

## 2019-03-06 DIAGNOSIS — Z7901 Long term (current) use of anticoagulants: Secondary | ICD-10-CM | POA: Diagnosis not present

## 2019-03-06 DIAGNOSIS — K828 Other specified diseases of gallbladder: Secondary | ICD-10-CM | POA: Diagnosis not present

## 2019-03-06 DIAGNOSIS — Z85820 Personal history of malignant melanoma of skin: Secondary | ICD-10-CM | POA: Diagnosis not present

## 2019-03-06 DIAGNOSIS — Z01818 Encounter for other preprocedural examination: Secondary | ICD-10-CM | POA: Insufficient documentation

## 2019-03-06 LAB — COMPREHENSIVE METABOLIC PANEL WITH GFR
ALT: 21 U/L (ref 0–44)
AST: 22 U/L (ref 15–41)
Albumin: 4 g/dL (ref 3.5–5.0)
Alkaline Phosphatase: 66 U/L (ref 38–126)
Anion gap: 7 (ref 5–15)
BUN: 23 mg/dL (ref 8–23)
CO2: 25 mmol/L (ref 22–32)
Calcium: 9.1 mg/dL (ref 8.9–10.3)
Chloride: 107 mmol/L (ref 98–111)
Creatinine, Ser: 0.78 mg/dL (ref 0.44–1.00)
GFR calc Af Amer: >60 mL/min
GFR calc non Af Amer: >60 mL/min
Glucose, Bld: 98 mg/dL (ref 70–99)
Potassium: 4.1 mmol/L (ref 3.5–5.1)
Sodium: 139 mmol/L (ref 135–145)
Total Bilirubin: 0.6 mg/dL (ref 0.3–1.2)
Total Protein: 6.5 g/dL (ref 6.5–8.1)

## 2019-03-06 LAB — CBC WITH DIFFERENTIAL/PLATELET
Abs Immature Granulocytes: 0.01 10*3/uL (ref 0.00–0.07)
Basophils Absolute: 0.1 10*3/uL (ref 0.0–0.1)
Basophils Relative: 1 %
Eosinophils Absolute: 0.1 10*3/uL (ref 0.0–0.5)
Eosinophils Relative: 2 %
HCT: 40.2 % (ref 36.0–46.0)
Hemoglobin: 13.3 g/dL (ref 12.0–15.0)
Immature Granulocytes: 0 %
Lymphocytes Relative: 25 %
Lymphs Abs: 1.6 10*3/uL (ref 0.7–4.0)
MCH: 34.4 pg — ABNORMAL HIGH (ref 26.0–34.0)
MCHC: 33.1 g/dL (ref 30.0–36.0)
MCV: 103.9 fL — ABNORMAL HIGH (ref 80.0–100.0)
Monocytes Absolute: 1 10*3/uL (ref 0.1–1.0)
Monocytes Relative: 16 %
Neutro Abs: 3.6 10*3/uL (ref 1.7–7.7)
Neutrophils Relative %: 56 %
Platelets: 226 10*3/uL (ref 150–400)
RBC: 3.87 MIL/uL (ref 3.87–5.11)
RDW: 12.3 % (ref 11.5–15.5)
WBC: 6.3 10*3/uL (ref 4.0–10.5)
nRBC: 0 % (ref 0.0–0.2)

## 2019-03-06 NOTE — Progress Notes (Signed)
Anesthesia Chart Review   Case: [id number] Date/Time: 03/12/19 1245   Procedures:      XI ROBOTIC ASSISTED LAPAROSCOPIC NISSEN FUNDOPLICATION (N/A )     LAPAROSCOPIC CHOLECYSTECTOMY (N/A )   Anesthesia type: General   Pre-op diagnosis: HIATAL HERNIA, GALLBLADDER SLEDGE   Location: [facility] ROOM 03 / [facility]   Surgeons: Clovis Riley, MD      DISCUSSION:68 y.o. never smoker with h/o PONV, PVD, GERD, paroxysmal atrial fibrillation (on Eliquis), scoliosis, hiatal hernia, gallbladder sludge scheduled for above procedure 03/12/2019 with Dr. Romana Juniper.   History of difficult intubation, pt reports due to scoliosis and herniated disc in cervical spine her neck needs to be in a neutral position.  Anesthesia records reviewed, no difficulty noted with total hip arthroplasty 09/02/2014 and 06/17/2014.   Previously cleared by cardiology for above procedure on 05/16/2018.  Per Will Camnitz, "Intermediate risk for intermediate risk procedure. OK to hold eliquis per surgeon recommendations but would restart ASAP after procedure." Pt reports last dose of Eliquis will be 03/08/2019.   Anticipate pt can proceed with planned procedure barring acute status change.   VS: BP 132/74 (BP Location: Left Arm)   Pulse 71   Temp 36.6 C (Oral)   Resp 16   Ht 5' 9.5" (1.765 m)   Wt 109.9 kg   SpO2 97%   BMI 35.25 kg/m   PROVIDERS: Lucretia Kern, DO is PCP   Constance Haw, MD is Electrophysiologist   Lyman Bishop, MD is Cardiologist  LABS: Labs reviewed: Acceptable for surgery. (all labs ordered are listed, but only abnormal results are displayed)  Labs Reviewed  CBC WITH DIFFERENTIAL/PLATELET - Abnormal; Notable for the following components:      Result Value   MCV 103.9 (*)    MCH 34.4 (*)    All other components within normal limits  COMPREHENSIVE METABOLIC PANEL     IMAGES: Chest Xray 05/07/2018 IMPRESSION: 1. Interim near complete clearing of bibasilar pulmonary infiltrates noted on  chest x-ray of 02/10/2018.  2.  Large sliding hiatal hernia again noted.  EKG: 10/16/2018 Rate 62 bpm Sinus rhythm with 1st degree AV block  Nonspecific intraventricular conduction delay   CV: Myocardial Perfusion Imaging 08/25/2016   Nuclear stress EF: 66%.  There was no ST segment deviation noted during stress.  The study is normal.  This is a low risk study.  The left ventricular ejection fraction is hyperdynamic (>65%).   Breast attenuation no ischemia or infarct EF 66%  Echo 08/25/2016 Study Conclusions  - Left ventricle: The cavity size was normal. Wall thickness was   increased in a pattern of mild LVH. Doppler parameters are   consistent with abnormal left ventricular relaxation (grade 1   diastolic dysfunction). Past Medical History:  Diagnosis Date  . Chronic edema   . Constipation   . Difficult intubation    pt states that her neck needs to be in a neutral position,  scoliosisand herniated dics in neck  . GERD (gastroesophageal reflux disease)    protonix, hx h. pylori  . Herniated disc, cervical   . History of hiatal hernia   . History of uterine cancer 04/22/2014   sees Dr. Ronita Hipps; s/p complete hysterectomy  . Hx of colonic polyp   . 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 Melanoma [facility])    sees Dr. Renda Rolls in dermatology right upper arm  . Obesity   . Paroxysmal atrial fibrillation (HCC)   . Peripheral vascular disease ([place])   .  PONV (postoperative nausea and vomiting)   . Recurrent UTI   . S/P hip replacement   . Scoliosis   . Thyroid nodule   . Urinary incontinence   . Uterine cancer (HCC)    Stage 1  . Venous insufficiency    s/p ablation    Past Surgical History:  Procedure Laterality Date  . ABDOMINAL HYSTERECTOMY  09/10/2013  . BUNIONECTOMY  10/1976  . COLONOSCOPY    . FRACTURE SURGERY  09/1964   floor of orbit right side  . JOINT REPLACEMENT     bilateral hip replacements  . MELANOMA EXCISION  12/2011  . TOTAL HIP  ARTHROPLASTY Left 06/17/2014   Procedure: LEFT TOTAL HIP ARTHROPLASTY ANTERIOR APPROACH;  Surgeon: Mcarthur Rossetti, MD;  Location: [place];  Service: Orthopedics;  Laterality: Left;  . TOTAL HIP ARTHROPLASTY Right 09/02/2014   Procedure: RIGHT TOTAL HIP ARTHROPLASTY ANTERIOR APPROACH;  Surgeon: Mcarthur Rossetti, MD;  Location: [place];  Service: Orthopedics;  Laterality: Right;  . VEIN SURGERY  05/2011   venous ablation     MEDICATIONS: . apixaban (ELIQUIS) 5 MG TABS tablet  . APPLE CIDER VINEGAR PO  . Cranberry 500 MG TABS  . cyclobenzaprine (FLEXERIL) 5 MG tablet  . diltiazem (CARDIZEM) 60 MG tablet  . diltiazem (TIAZAC) 240 MG 24 hr capsule  . flecainide (TAMBOCOR) 100 MG tablet  . methenamine (MANDELAMINE) 1 g tablet  . mirabegron ER (MYRBETRIQ) 50 MG TB24 tablet  . OVER THE COUNTER MEDICATION  . pantoprazole (PROTONIX) 40 MG tablet  . polyethylene glycol (MIRALAX / GLYCOLAX) packet  . senna-docusate (SENOKOT-S) 8.6-50 MG tablet  . spironolactone (ALDACTONE) 50 MG tablet  . triamcinolone cream (KENALOG) 0.1 %  . vitamin C (ASCORBIC ACID) 500 MG tablet   No current facility-administered medications for this encounter.    Maia Plan [facility] Pre-Surgical Testing [phone] 03/06/19  12:24 PM

## 2019-03-09 ENCOUNTER — Other Ambulatory Visit (HOSPITAL_COMMUNITY)
Admission: RE | Admit: 2019-03-09 | Discharge: 2019-03-09 | Disposition: A | Discharge status: Home / Self Care | Payer: PPO | Source: Ambulatory Visit | Attending: Surgery | Admitting: Surgery

## 2019-03-09 DIAGNOSIS — Z20828 Contact with and (suspected) exposure to other viral communicable diseases: Secondary | ICD-10-CM | POA: Diagnosis present

## 2019-03-09 DIAGNOSIS — Z90710 Acquired absence of both cervix and uterus: Secondary | ICD-10-CM | POA: Diagnosis not present

## 2019-03-09 DIAGNOSIS — Z23 Encounter for immunization: Secondary | ICD-10-CM | POA: Diagnosis present

## 2019-03-09 DIAGNOSIS — Z7901 Long term (current) use of anticoagulants: Secondary | ICD-10-CM | POA: Diagnosis not present

## 2019-03-09 DIAGNOSIS — K449 Diaphragmatic hernia without obstruction or gangrene: Secondary | ICD-10-CM | POA: Diagnosis present

## 2019-03-09 DIAGNOSIS — E041 Nontoxic single thyroid nodule: Secondary | ICD-10-CM | POA: Diagnosis not present

## 2019-03-09 DIAGNOSIS — I480 Paroxysmal atrial fibrillation: Secondary | ICD-10-CM | POA: Diagnosis present

## 2019-03-09 DIAGNOSIS — Z881 Allergy status to other antibiotic agents status: Secondary | ICD-10-CM | POA: Diagnosis not present

## 2019-03-09 DIAGNOSIS — K8050 Calculus of bile duct without cholangitis or cholecystitis without obstruction: Secondary | ICD-10-CM | POA: Diagnosis present

## 2019-03-09 DIAGNOSIS — E042 Nontoxic multinodular goiter: Secondary | ICD-10-CM | POA: Diagnosis present

## 2019-03-09 DIAGNOSIS — Z01812 Encounter for preprocedural laboratory examination: Secondary | ICD-10-CM | POA: Insufficient documentation

## 2019-03-09 DIAGNOSIS — Z8701 Personal history of pneumonia (recurrent): Secondary | ICD-10-CM | POA: Diagnosis not present

## 2019-03-09 DIAGNOSIS — Z96643 Presence of artificial hip joint, bilateral: Secondary | ICD-10-CM | POA: Diagnosis present

## 2019-03-09 DIAGNOSIS — I4891 Unspecified atrial fibrillation: Secondary | ICD-10-CM | POA: Diagnosis not present

## 2019-03-09 DIAGNOSIS — K828 Other specified diseases of gallbladder: Secondary | ICD-10-CM | POA: Diagnosis present

## 2019-03-09 DIAGNOSIS — K219 Gastro-esophageal reflux disease without esophagitis: Secondary | ICD-10-CM | POA: Diagnosis present

## 2019-03-09 DIAGNOSIS — K811 Chronic cholecystitis: Secondary | ICD-10-CM | POA: Diagnosis not present

## 2019-03-09 LAB — SARS CORONAVIRUS 2 (TAT 6-24 HRS): SARS Coronavirus 2: NEGATIVE

## 2019-03-11 MED ORDER — BUPIVACAINE LIPOSOME 1.3 % IJ SUSP
20.0000 mL | Freq: Once | INTRAMUSCULAR | Status: DC
  Filled 2019-03-11: qty 20

## 2019-03-12 ENCOUNTER — Inpatient Hospital Stay (HOSPITAL_COMMUNITY)
Admission: RE | Admit: 2019-03-12 | Discharge: 2019-03-13 | DRG: 328 | Disposition: A | Discharge status: Home / Self Care | Payer: PPO | Attending: Surgery | Admitting: Surgery

## 2019-03-12 ENCOUNTER — Encounter (HOSPITAL_COMMUNITY): Payer: Self-pay | Admitting: Surgery

## 2019-03-12 ENCOUNTER — Encounter (HOSPITAL_COMMUNITY)
Admission: RE | Disposition: A | Discharge status: Home / Self Care | Payer: Self-pay | Source: Home / Self Care | Attending: Surgery

## 2019-03-12 ENCOUNTER — Ambulatory Visit (HOSPITAL_COMMUNITY): Payer: PPO | Admitting: Physician Assistant

## 2019-03-12 DIAGNOSIS — Z881 Allergy status to other antibiotic agents status: Secondary | ICD-10-CM

## 2019-03-12 DIAGNOSIS — K8050 Calculus of bile duct without cholangitis or cholecystitis without obstruction: Secondary | ICD-10-CM | POA: Diagnosis present

## 2019-03-12 DIAGNOSIS — Z7901 Long term (current) use of anticoagulants: Secondary | ICD-10-CM

## 2019-03-12 DIAGNOSIS — Z8701 Personal history of pneumonia (recurrent): Secondary | ICD-10-CM | POA: Diagnosis not present

## 2019-03-12 DIAGNOSIS — K828 Other specified diseases of gallbladder: Secondary | ICD-10-CM | POA: Diagnosis present

## 2019-03-12 DIAGNOSIS — Z90710 Acquired absence of both cervix and uterus: Secondary | ICD-10-CM

## 2019-03-12 DIAGNOSIS — E042 Nontoxic multinodular goiter: Secondary | ICD-10-CM | POA: Diagnosis present

## 2019-03-12 DIAGNOSIS — Z96643 Presence of artificial hip joint, bilateral: Secondary | ICD-10-CM | POA: Diagnosis present

## 2019-03-12 DIAGNOSIS — Z23 Encounter for immunization: Secondary | ICD-10-CM

## 2019-03-12 DIAGNOSIS — I4891 Unspecified atrial fibrillation: Secondary | ICD-10-CM | POA: Diagnosis not present

## 2019-03-12 DIAGNOSIS — Z8719 Personal history of other diseases of the digestive system: Secondary | ICD-10-CM

## 2019-03-12 DIAGNOSIS — I480 Paroxysmal atrial fibrillation: Secondary | ICD-10-CM | POA: Diagnosis present

## 2019-03-12 DIAGNOSIS — Z20828 Contact with and (suspected) exposure to other viral communicable diseases: Secondary | ICD-10-CM | POA: Diagnosis present

## 2019-03-12 DIAGNOSIS — K219 Gastro-esophageal reflux disease without esophagitis: Secondary | ICD-10-CM | POA: Diagnosis present

## 2019-03-12 DIAGNOSIS — K811 Chronic cholecystitis: Secondary | ICD-10-CM | POA: Diagnosis not present

## 2019-03-12 DIAGNOSIS — K449 Diaphragmatic hernia without obstruction or gangrene: Secondary | ICD-10-CM | POA: Diagnosis present

## 2019-03-12 DIAGNOSIS — E041 Nontoxic single thyroid nodule: Secondary | ICD-10-CM | POA: Diagnosis not present

## 2019-03-12 HISTORY — PX: CHOLECYSTECTOMY: SHX55

## 2019-03-12 LAB — MRSA PCR SCREENING: MRSA by PCR: NEGATIVE

## 2019-03-12 SURGERY — FUNDOPLICATION, NISSEN, ROBOT-ASSISTED, LAPAROSCOPIC
Anesthesia: General | Site: Abdomen

## 2019-03-12 MED ORDER — FENTANYL CITRATE (PF) 250 MCG/5ML IJ SOLN
INTRAMUSCULAR | Status: AC
  Filled 2019-03-12: qty 5

## 2019-03-12 MED ORDER — ONDANSETRON HCL 4 MG/2ML IJ SOLN: 4.0000 mg | Freq: Four times a day (QID) | INTRAMUSCULAR | Status: DC | PRN

## 2019-03-12 MED ORDER — SIMETHICONE 80 MG PO CHEW: 40.0000 mg | CHEWABLE_TABLET | Freq: Four times a day (QID) | ORAL | Status: DC | PRN

## 2019-03-12 MED ORDER — EPHEDRINE SULFATE-NACL 50-0.9 MG/10ML-% IV SOSY
PREFILLED_SYRINGE | INTRAVENOUS | Status: DC | PRN
  Administered 2019-03-12: 5 mg via INTRAVENOUS

## 2019-03-12 MED ORDER — ACETAMINOPHEN 10 MG/ML IV SOLN: 1000.0000 mg | Freq: Once | INTRAVENOUS | Status: DC | PRN

## 2019-03-12 MED ORDER — FENTANYL CITRATE (PF) 100 MCG/2ML IJ SOLN
INTRAMUSCULAR | Status: DC | PRN
  Administered 2019-03-12: 50 ug via INTRAVENOUS
  Administered 2019-03-12 (×2): 25 ug via INTRAVENOUS
  Administered 2019-03-12 (×5): 50 ug via INTRAVENOUS

## 2019-03-12 MED ORDER — LACTATED RINGERS IV SOLN: INTRAVENOUS | Status: DC

## 2019-03-12 MED ORDER — LIDOCAINE 2% (20 MG/ML) 5 ML SYRINGE
INTRAMUSCULAR | Status: DC | PRN
  Administered 2019-03-12: 40 mg via INTRAVENOUS
  Administered 2019-03-12: 60 mg via INTRAVENOUS

## 2019-03-12 MED ORDER — INDOCYANINE GREEN 25 MG IV SOLR
2.5000 mg | Freq: Once | INTRAVENOUS | Status: AC
  Administered 2019-03-12: 14:00:00 2.5 mg via INTRAVENOUS
  Filled 2019-03-12: qty 2.5

## 2019-03-12 MED ORDER — DEXAMETHASONE SODIUM PHOSPHATE 10 MG/ML IJ SOLN
INTRAMUSCULAR | Status: DC | PRN
  Administered 2019-03-12: 10 mg via INTRAVENOUS
  Administered 2019-03-12: 250 mg via INTRAVENOUS

## 2019-03-12 MED ORDER — SCOPOLAMINE 1 MG/3DAYS TD PT72
MEDICATED_PATCH | TRANSDERMAL | Status: DC | PRN
  Administered 2019-03-12: 1 via TRANSDERMAL

## 2019-03-12 MED ORDER — TRAMADOL HCL 50 MG PO TABS: 50.0000 mg | ORAL_TABLET | Freq: Four times a day (QID) | ORAL | Status: DC | PRN

## 2019-03-12 MED ORDER — KETOROLAC TROMETHAMINE 15 MG/ML IJ SOLN: 15.0000 mg | Freq: Four times a day (QID) | INTRAMUSCULAR | Status: DC | PRN

## 2019-03-12 MED ORDER — ENOXAPARIN SODIUM 40 MG/0.4ML ~~LOC~~ SOLN
40.0000 mg | SUBCUTANEOUS | Status: DC
  Administered 2019-03-13: 40 mg via SUBCUTANEOUS
  Filled 2019-03-12: qty 0.4

## 2019-03-12 MED ORDER — GABAPENTIN 300 MG PO CAPS
300.0000 mg | ORAL_CAPSULE | ORAL | Status: AC
  Administered 2019-03-12: 300 mg via ORAL
  Filled 2019-03-12: qty 1

## 2019-03-12 MED ORDER — SUCCINYLCHOLINE CHLORIDE 200 MG/10ML IV SOSY
PREFILLED_SYRINGE | INTRAVENOUS | Status: DC | PRN
  Administered 2019-03-12: 100 mg via INTRAVENOUS

## 2019-03-12 MED ORDER — ONDANSETRON HCL 4 MG/2ML IJ SOLN
INTRAMUSCULAR | Status: AC
  Filled 2019-03-12: qty 2

## 2019-03-12 MED ORDER — FLECAINIDE ACETATE 50 MG PO TABS
150.0000 mg | ORAL_TABLET | Freq: Two times a day (BID) | ORAL | Status: DC
  Administered 2019-03-12 – 2019-03-13 (×2): 150 mg via ORAL
  Filled 2019-03-12 (×3): qty 1

## 2019-03-12 MED ORDER — ACETAMINOPHEN 500 MG PO TABS
1000.0000 mg | ORAL_TABLET | Freq: Four times a day (QID) | ORAL | Status: DC
  Administered 2019-03-12 – 2019-03-13 (×3): 1000 mg via ORAL
  Filled 2019-03-12 (×3): qty 2

## 2019-03-12 MED ORDER — ONDANSETRON 4 MG PO TBDP: 4.0000 mg | ORAL_TABLET | Freq: Four times a day (QID) | ORAL | Status: DC | PRN

## 2019-03-12 MED ORDER — BUPIVACAINE HCL (PF) 0.25 % IJ SOLN
INTRAMUSCULAR | Status: AC
  Filled 2019-03-12: qty 30

## 2019-03-12 MED ORDER — METOCLOPRAMIDE HCL 5 MG/ML IJ SOLN: 10.0000 mg | Freq: Four times a day (QID) | INTRAMUSCULAR | Status: DC

## 2019-03-12 MED ORDER — ACETAMINOPHEN 325 MG PO TABS: 325.0000 mg | ORAL_TABLET | Freq: Once | ORAL | Status: DC | PRN

## 2019-03-12 MED ORDER — ACETAMINOPHEN 160 MG/5ML PO SOLN: 325.0000 mg | Freq: Once | ORAL | Status: DC | PRN

## 2019-03-12 MED ORDER — LIDOCAINE 20MG/ML (2%) 15 ML SYRINGE OPTIME
INTRAMUSCULAR | Status: DC | PRN
  Administered 2019-03-12: 1.5 mg/kg/h via INTRAVENOUS

## 2019-03-12 MED ORDER — BUPIVACAINE LIPOSOME 1.3 % IJ SUSP
INTRAMUSCULAR | Status: DC | PRN
  Administered 2019-03-12: 20 mL

## 2019-03-12 MED ORDER — DILTIAZEM HCL 60 MG PO TABS: 60.0000 mg | ORAL_TABLET | Freq: Every day | ORAL | Status: DC | PRN

## 2019-03-12 MED ORDER — DIPHENHYDRAMINE HCL 12.5 MG/5ML PO ELIX: 12.5000 mg | ORAL_SOLUTION | Freq: Four times a day (QID) | ORAL | Status: DC | PRN

## 2019-03-12 MED ORDER — SODIUM CHLORIDE 0.9 % IV SOLN: INTRAVENOUS | Status: DC

## 2019-03-12 MED ORDER — ONDANSETRON HCL 4 MG/2ML IJ SOLN
INTRAMUSCULAR | Status: DC | PRN
  Administered 2019-03-12: 4 mg via INTRAVENOUS

## 2019-03-12 MED ORDER — PROMETHAZINE HCL 25 MG/ML IJ SOLN: 6.2500 mg | INTRAMUSCULAR | Status: DC | PRN

## 2019-03-12 MED ORDER — DOCUSATE SODIUM 100 MG PO CAPS
100.0000 mg | ORAL_CAPSULE | Freq: Two times a day (BID) | ORAL | Status: DC
  Administered 2019-03-12 – 2019-03-13 (×2): 100 mg via ORAL
  Filled 2019-03-12 (×2): qty 1

## 2019-03-12 MED ORDER — PANTOPRAZOLE SODIUM 40 MG IV SOLR: 40.0000 mg | Freq: Every day | INTRAVENOUS | Status: DC

## 2019-03-12 MED ORDER — CEFAZOLIN SODIUM-DEXTROSE 2-4 GM/100ML-% IV SOLN
2.0000 g | INTRAVENOUS | Status: AC
  Administered 2019-03-12: 12:00:00 2 g via INTRAVENOUS
  Filled 2019-03-12: qty 100

## 2019-03-12 MED ORDER — KETAMINE HCL 10 MG/ML IJ SOLN
INTRAMUSCULAR | Status: DC | PRN
  Administered 2019-03-12: 25 mg via INTRAVENOUS

## 2019-03-12 MED ORDER — BUPIVACAINE HCL (PF) 0.25 % IJ SOLN
INTRAMUSCULAR | Status: DC | PRN
  Administered 2019-03-12: 30 mL

## 2019-03-12 MED ORDER — FENTANYL CITRATE (PF) 100 MCG/2ML IJ SOLN: 25.0000 ug | INTRAMUSCULAR | Status: DC | PRN

## 2019-03-12 MED ORDER — CHLORHEXIDINE GLUCONATE 4 % EX LIQD: 60.0000 mL | Freq: Once | CUTANEOUS | Status: DC

## 2019-03-12 MED ORDER — PROPOFOL 10 MG/ML IV BOLUS
INTRAVENOUS | Status: DC | PRN
  Administered 2019-03-12: 150 mg via INTRAVENOUS

## 2019-03-12 MED ORDER — DIPHENHYDRAMINE HCL 50 MG/ML IJ SOLN: 12.5000 mg | Freq: Four times a day (QID) | INTRAMUSCULAR | Status: DC | PRN

## 2019-03-12 MED ORDER — HYDROMORPHONE HCL 1 MG/ML IJ SOLN: 0.5000 mg | INTRAMUSCULAR | Status: DC | PRN

## 2019-03-12 MED ORDER — METHENAMINE MANDELATE 0.5 G PO TABS
500.0000 mg | ORAL_TABLET | Freq: Two times a day (BID) | ORAL | Status: DC
  Administered 2019-03-12 – 2019-03-13 (×2): 500 mg via ORAL
  Filled 2019-03-12 (×3): qty 1

## 2019-03-12 MED ORDER — ESMOLOL HCL 100 MG/10ML IV SOLN
INTRAVENOUS | Status: DC | PRN
  Administered 2019-03-12: 30 ug via INTRAVENOUS

## 2019-03-12 MED ORDER — SPIRONOLACTONE 25 MG PO TABS
50.0000 mg | ORAL_TABLET | Freq: Two times a day (BID) | ORAL | Status: DC
  Administered 2019-03-12 – 2019-03-13 (×2): 50 mg via ORAL
  Filled 2019-03-12 (×2): qty 2

## 2019-03-12 MED ORDER — GABAPENTIN 300 MG PO CAPS: 300.0000 mg | ORAL_CAPSULE | Freq: Two times a day (BID) | ORAL | Status: DC

## 2019-03-12 MED ORDER — PANTOPRAZOLE SODIUM 40 MG PO TBEC
40.0000 mg | DELAYED_RELEASE_TABLET | Freq: Every day | ORAL | Status: DC
  Administered 2019-03-13: 40 mg via ORAL
  Filled 2019-03-12: qty 1

## 2019-03-12 MED ORDER — SCOPOLAMINE 1 MG/3DAYS TD PT72
MEDICATED_PATCH | TRANSDERMAL | Status: AC
  Filled 2019-03-12: qty 1

## 2019-03-12 MED ORDER — FENTANYL CITRATE (PF) 100 MCG/2ML IJ SOLN
INTRAMUSCULAR | Status: AC
  Filled 2019-03-12: qty 2

## 2019-03-12 MED ORDER — MIDAZOLAM HCL 5 MG/5ML IJ SOLN
INTRAMUSCULAR | Status: DC | PRN
  Administered 2019-03-12: 2 mg via INTRAVENOUS

## 2019-03-12 MED ORDER — BISACODYL 10 MG RE SUPP: 10.0000 mg | Freq: Every day | RECTAL | Status: DC | PRN

## 2019-03-12 MED ORDER — ROCURONIUM BROMIDE 10 MG/ML (PF) SYRINGE
PREFILLED_SYRINGE | INTRAVENOUS | Status: DC | PRN
  Administered 2019-03-12: 20 mg via INTRAVENOUS
  Administered 2019-03-12: 50 mg via INTRAVENOUS
  Administered 2019-03-12 (×2): 20 mg via INTRAVENOUS

## 2019-03-12 MED ORDER — DILTIAZEM HCL ER COATED BEADS 120 MG PO CP24
240.0000 mg | ORAL_CAPSULE | Freq: Every day | ORAL | Status: DC
  Administered 2019-03-13: 240 mg via ORAL
  Filled 2019-03-12: qty 2

## 2019-03-12 MED ORDER — OXYCODONE HCL 5 MG PO TABS: 5.0000 mg | ORAL_TABLET | Freq: Four times a day (QID) | ORAL | Status: DC | PRN

## 2019-03-12 MED ORDER — ACETAMINOPHEN 500 MG PO TABS
1000.0000 mg | ORAL_TABLET | ORAL | Status: AC
  Administered 2019-03-12: 1000 mg via ORAL
  Filled 2019-03-12: qty 2

## 2019-03-12 MED ORDER — LACTATED RINGERS IR SOLN
Status: DC | PRN
  Administered 2019-03-12: 1000 mL

## 2019-03-12 MED ORDER — ROCURONIUM BROMIDE 10 MG/ML (PF) SYRINGE
PREFILLED_SYRINGE | INTRAVENOUS | Status: AC
  Filled 2019-03-12: qty 10

## 2019-03-12 MED ORDER — MIDAZOLAM HCL 2 MG/2ML IJ SOLN
INTRAMUSCULAR | Status: AC
  Filled 2019-03-12: qty 2

## 2019-03-12 MED ORDER — 0.9 % SODIUM CHLORIDE (POUR BTL) OPTIME
TOPICAL | Status: DC | PRN
  Administered 2019-03-12: 1000 mL

## 2019-03-12 MED ORDER — CHLORHEXIDINE GLUCONATE CLOTH 2 % EX PADS
6.0000 | MEDICATED_PAD | Freq: Every day | CUTANEOUS | Status: DC
  Administered 2019-03-12: 6 via TOPICAL

## 2019-03-12 MED ORDER — POLYETHYLENE GLYCOL 3350 17 G PO PACK: 17.0000 g | PACK | Freq: Every day | ORAL | Status: DC | PRN

## 2019-03-12 MED ORDER — MEPERIDINE HCL 50 MG/ML IJ SOLN: 6.2500 mg | INTRAMUSCULAR | Status: DC | PRN

## 2019-03-12 MED ORDER — MIRABEGRON ER 25 MG PO TB24
25.0000 mg | ORAL_TABLET | ORAL | Status: DC
  Administered 2019-03-13: 25 mg via ORAL
  Filled 2019-03-12: qty 1

## 2019-03-12 MED ORDER — METHOCARBAMOL 500 MG PO TABS: 500.0000 mg | ORAL_TABLET | Freq: Four times a day (QID) | ORAL | Status: DC | PRN

## 2019-03-12 SURGICAL SUPPLY — 57 items
BLADE SURG SZ11 CARB STEEL (BLADE) ×3 IMPLANT
CHLORAPREP W/TINT 26 (MISCELLANEOUS) ×3 IMPLANT
COVER SURGICAL LIGHT HANDLE (MISCELLANEOUS) ×3 IMPLANT
COVER TIP SHEARS 8 DVNC (MISCELLANEOUS) IMPLANT
COVER TIP SHEARS 8MM DA VINCI (MISCELLANEOUS)
COVER WAND RF STERILE (DRAPES) ×3 IMPLANT
DERMABOND ADVANCED (GAUZE/BANDAGES/DRESSINGS) ×2
DERMABOND ADVANCED .7 DNX12 (GAUZE/BANDAGES/DRESSINGS) ×1 IMPLANT
DRAIN PENROSE 18X1/2 LTX STRL (DRAIN) ×3 IMPLANT
DRAPE ARM DVNC X/XI (DISPOSABLE) ×4 IMPLANT
DRAPE COLUMN DVNC XI (DISPOSABLE) ×1 IMPLANT
DRAPE DA VINCI XI ARM (DISPOSABLE) ×8
DRAPE DA VINCI XI COLUMN (DISPOSABLE) ×2
ELECT REM PT RETURN 15FT ADLT (MISCELLANEOUS) ×3 IMPLANT
ENDOLOOP SUT PDS II  0 18 (SUTURE)
ENDOLOOP SUT PDS II 0 18 (SUTURE) IMPLANT
FELT TEFLON 4 X1 (Mesh General) ×3 IMPLANT
GAUZE 4X4 16PLY RFD (DISPOSABLE) ×3 IMPLANT
GLOVE BIO SURGEON STRL SZ 6 (GLOVE) ×9 IMPLANT
GLOVE INDICATOR 6.5 STRL GRN (GLOVE) ×9 IMPLANT
GOWN STRL REUS W/TWL LRG LVL3 (GOWN DISPOSABLE) ×9 IMPLANT
GOWN STRL REUS W/TWL XL LVL3 (GOWN DISPOSABLE) ×6 IMPLANT
GRASPER SUT TROCAR 14GX15 (MISCELLANEOUS) ×3 IMPLANT
HEMOSTAT SNOW SURGICEL 2X4 (HEMOSTASIS) IMPLANT
KIT BASIN OR (CUSTOM PROCEDURE TRAY) ×3 IMPLANT
KIT TURNOVER KIT A (KITS) IMPLANT
MARKER SKIN DUAL TIP RULER LAB (MISCELLANEOUS) ×3 IMPLANT
NEEDLE HYPO 22GX1.5 SAFETY (NEEDLE) IMPLANT
NEEDLE INSUFFLATION 14GA 120MM (NEEDLE) IMPLANT
NEEDLE SPNL 22GX3.5 QUINCKE BK (NEEDLE) ×3 IMPLANT
OBTURATOR OPTICAL STANDARD 8MM (TROCAR)
OBTURATOR OPTICAL STND 8 DVNC (TROCAR)
OBTURATOR OPTICALSTD 8 DVNC (TROCAR) IMPLANT
PACK CARDIOVASCULAR III (CUSTOM PROCEDURE TRAY) ×3 IMPLANT
PAD POSITIONING PINK XL (MISCELLANEOUS) ×3 IMPLANT
POUCH SPECIMEN RETRIEVAL 10MM (ENDOMECHANICALS) ×3 IMPLANT
SCISSORS LAP 5X35 DISP (ENDOMECHANICALS) ×3 IMPLANT
SEAL CANN UNIV 5-8 DVNC XI (MISCELLANEOUS) ×4 IMPLANT
SEAL XI 5MM-8MM UNIVERSAL (MISCELLANEOUS) ×8
SEALER VESSEL DA VINCI XI (MISCELLANEOUS) ×2
SEALER VESSEL EXT DVNC XI (MISCELLANEOUS) ×1 IMPLANT
SET IRRIG TUBING LAPAROSCOPIC (IRRIGATION / IRRIGATOR) ×3 IMPLANT
SOL ANTI FOG 6CC (MISCELLANEOUS) ×1 IMPLANT
SOLUTION ANTI FOG 6CC (MISCELLANEOUS) ×2
SOLUTION ELECTROLUBE (MISCELLANEOUS) ×3 IMPLANT
SUT ETHIBOND 0 36 GRN (SUTURE) ×12 IMPLANT
SUT MNCRL AB 4-0 PS2 18 (SUTURE) ×3 IMPLANT
SUT SILK 0 SH 30 (SUTURE) IMPLANT
SUT SILK 2 0 SH (SUTURE) IMPLANT
SYR 20ML LL LF (SYRINGE) ×3 IMPLANT
TIP INNERVISION DETACH 40FR (MISCELLANEOUS) IMPLANT
TIP INNERVISION DETACH 50FR (MISCELLANEOUS) IMPLANT
TIP INNERVISION DETACH 56FR (MISCELLANEOUS) IMPLANT
TIPS INNERVISION DETACH 40FR (MISCELLANEOUS)
TOWEL OR 17X26 10 PK STRL BLUE (TOWEL DISPOSABLE) ×3 IMPLANT
TOWEL OR NON WOVEN STRL DISP B (DISPOSABLE) ×3 IMPLANT
TROCAR ADV FIXATION 5X100MM (TROCAR) ×3 IMPLANT

## 2019-03-12 NOTE — Transfer of Care (Signed)
Immediate Anesthesia Transfer of Care Note  Patient: [name]  Procedure(s) Performed: XI ROBOTIC ASSISTED PARAESOPHAGEAL HERNIA REPAIR AND toupet FUNDOPLICATION (N/A Abdomen) XI ROBOTIC ASSISTED CHOLECYSTECTOMY (N/A Abdomen)  Patient Location: PACU  Anesthesia Type:General  Level of Consciousness: drowsy, patient cooperative and responds to stimulation  Airway & Oxygen Therapy: Patient Spontanous Breathing and Patient connected to face mask oxygen  Post-op Assessment: Report given to RN and Post -op Vital signs reviewed and stable  Post vital signs: Reviewed and stable  Last Vitals:  Vitals Value Taken Time  BP 144/125 03/12/19 1602  Temp    Pulse 68 03/12/19 1604  Resp 23 03/12/19 1604  SpO2 97 % 03/12/19 1604  Vitals shown include unvalidated device data.  Last Pain:  Vitals:   03/12/19 1036  TempSrc: Oral         Complications: No apparent anesthesia complications

## 2019-03-12 NOTE — Anesthesia Procedure Notes (Signed)
Procedure Name: Intubation Date/Time: 03/12/2019 12:14 PM Performed by: Silas Sacramento, CRNA Pre-anesthesia Checklist: Patient identified, Emergency Drugs available, Suction available and Patient being monitored Patient Re-evaluated:Patient Re-evaluated prior to induction Oxygen Delivery Method: Circle system utilized Preoxygenation: Pre-oxygenation with 100% oxygen Induction Type: IV induction, Rapid sequence and Cricoid Pressure applied Laryngoscope Size: Glidescope and 4 Grade View: Grade I Tube type: Oral Tube size: 7.0 mm Number of attempts: 1 Airway Equipment and Method: Video-laryngoscopy and Rigid stylet Placement Confirmation: ETT inserted through vocal cords under direct vision,  positive ETCO2 and breath sounds checked- equal and bilateral Secured at: 22 cm Tube secured with: Tape Dental Injury: Teeth and Oropharynx as per pre-operative assessment  Comments: Video laryngoscopy utilized d/t pt's hx of cervical spinal issues. Pt did report normal mobility. G1V easily attained with VL.

## 2019-03-12 NOTE — Anesthesia Postprocedure Evaluation (Signed)
Anesthesia Post Note  Patient: OSIA AYE  Procedure(s) Performed: XI ROBOTIC ASSISTED PARAESOPHAGEAL HERNIA REPAIR AND toupet FUNDOPLICATION (N/A Abdomen) XI ROBOTIC ASSISTED CHOLECYSTECTOMY (N/A Abdomen)     Patient location during evaluation: PACU Anesthesia Type: General Level of consciousness: awake and alert Pain management: pain level controlled Vital Signs Assessment: post-procedure vital signs reviewed and stable Respiratory status: spontaneous breathing, nonlabored ventilation, respiratory function stable and patient connected to nasal cannula oxygen Cardiovascular status: blood pressure returned to baseline and stable Postop Assessment: no apparent nausea or vomiting Anesthetic complications: no    Last Vitals:  Vitals:   03/12/19 1831 03/12/19 1930  BP: (!) 138/103 (!) 144/65  Pulse: 85 72  Resp: 15 18  Temp: (!) 36.3 C (!) 36.4 C  SpO2: 96% 95%    Last Pain:  Vitals:   03/12/19 1930  TempSrc:   PainSc: 0-No pain                 Effie Berkshire

## 2019-03-12 NOTE — Anesthesia Preprocedure Evaluation (Addendum)
Anesthesia Evaluation  Patient identified by MRN, date of birth, ID band Patient awake    Reviewed: Allergy & Precautions, NPO status , Patient's Chart, lab work & pertinent test results  History of Anesthesia Complications (+) PONV and history of anesthetic complications  Airway Mallampati: I  TM Distance: >3 FB Neck ROM: Limited    Dental  (+) Teeth Intact, Dental Advisory Given   Pulmonary neg pulmonary ROS,    breath sounds clear to auscultation       Cardiovascular + Peripheral Vascular Disease   Rhythm:Regular Rate:Normal     Neuro/Psych negative neurological ROS  negative psych ROS   GI/Hepatic Neg liver ROS, hiatal hernia, GERD  Medicated,  Endo/Other  negative endocrine ROS  Renal/GU negative Renal ROS     Musculoskeletal  (+) Arthritis ,   Abdominal (+) + obese,   Peds  Hematology negative hematology ROS (+)   Anesthesia Other Findings   Reproductive/Obstetrics                            Anesthesia Physical Anesthesia Plan  ASA: III  Anesthesia Plan: General   Post-op Pain Management:    Induction: Intravenous  PONV Risk Score and Plan: 4 or greater and Ondansetron, Dexamethasone, Midazolam and Scopolamine patch - Pre-op  Airway Management Planned: Oral ETT and Video Laryngoscope Planned  Additional Equipment: None  Intra-op Plan:   Post-operative Plan: Extubation in OR  Informed Consent:   Plan Discussed with: CRNA  Anesthesia Plan Comments:         Anesthesia Quick Evaluation

## 2019-03-12 NOTE — H&P (Signed)
[name] DOB: [birth year]  CC: Hiatal hernia  History of Present Illness: Presents today for scheduled robotic repair of large paraesophageal hernia and cholecystectomy.  Reports no major issues with her health since our initial consultation.  Her atrial fibrillation has been controlled.  She stopped her Eliquis after her evening dose on Friday.   Initial visit 10/19/18: Very nice 68 year old woman following up for management of large paraesophageal hernia. Since her last evaluation by Dr. Excell Seltzer about 5 months ago, she has been doing fairly well. She has continued to have intermittent atrial fibrillation, but with medication changes she is now just having brief spikes as opposed to prolonged episodes. She last saw Dr. Curt Bears earlier this week. She does have some episodes of indigestion and had one episode recently difficulty swallowing. She has been cleared to proceed by cardiology and is considered intermediate risk category. She will need to hold eliquis for at least 48 hours preoperatively. 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 She hopes that by doing this in December when family is around she will minimize impact on their life.  Prior surgery includes a robotic hysterectomy in 2015. Has also had bilateral total hips  Evaluated by Dr. Excell Seltzer, March 2020: "Patient is a pleasant 63 YOF known to our office, followed by Dr Harlow Asa for thyroid nodules. Patient had developed abdominal symptoms during an episode of pneumonia for which she was hospitalized in late November '19. Laboratory studies showed mild elevation of her transaminases. Subsequent abdominal ultrasound demonstrated mild gallbladder sludge. Patient was referred to our office for evaluation. In the interim, the patient has been having problems with her atrial fibrillation. She is on anticoagulation. She was  referred to cardiology and consideration was made for undergoing an ablation procedure. As part of their workup, she underwent a CT scan of the chest abdomen and pelvis. CT scan showed a very large hiatal hernia with a large portion of the stomach present in the chest. This was felt to preclude cardiac ablation. Recommendation was made for the patient to be seen for repair of hiatal hernia. She has chronic reflux for which she takes Protonix with fairly good control but some breakthrough Sxs. Has frequent gas and bloating after mieals. No dysphagia. Had some episodes of ruq pain after meals late last year but none recently. She lives alone, active, non smoker. Her a fib is increasingly symptomatic and difficult to control.  Large hiatal hernia with reflux. Some breakthrough reflux on protonix and bloating and belching post prandial. Some sxs suggestive of biliary pain and mild elevated LFTs. Most significantly her HH is large enough to impinge on her L atrium and pulmonary vein precluding ablation which she needs. We discussed laparoscopic repair of her hiatal hernia with fundoplication in order to relive her current GI sxs and allow for ablation for her atriatl fib. Discussed the procedure and recovery in detail. She was given literature. Discussed risks of bleeding, infections, blood clots, cardiorespiratory complcations, esophageal or other visceral injury, recurrence and dysphagia. Side effects of gas bloat and inability to vomit were discussed. Would reccommend cholecystectomy and IOC at the same time due to sxs suggestive of GB and mild elevated LFTs. All her questions were answered and she agrees to proceed.".   Allergies  GLUTEN, WHEAT  Hydrolyzed Elastin *CHEMICALS*  Hives. Allergies Reconciled   Medication History  Benzonatate (100MG  Capsule, Oral) Active. Methenamine Hippurate (1GM  Tablet, Oral) Active. Myrbetriq (25MG  Tablet ER 24HR, Oral) Active. Diltiazem CD (240MG  Capsule  ER 24HR, Oral) Active. dilTIAZem HCl (60MG  Tablet, Oral) Active. Cranberry (500MG  Capsule, Oral) Active. Dronedarone HCl (400MG  Tablet, Oral) Active. Eliquis (5MG  Tablet, Oral) Active. Myrbetriq (50MG  Tablet ER 24HR, Oral) Active. Pantoprazole Sodium (40MG  Tablet DR, Oral) Active. Senna Plus (8.6-50MG  Tablet, Oral) Active. Medications Reconciled    Review of Systems ) All other systems negative  Vitals:   03/12/19 1036  BP: (!) 143/69  Pulse: 70  Resp: 15  Temp: 98.1 F (36.7 C)  SpO2: 98%      Physical Exam  Alert and cooperative, in no distress. Unlabored respirations.    Assessment & Plan HIATAL HERNIA (K44.9) Impression: Large hiatal hernia with reflux. Some breakthrough reflux on protonix and bloating and belching post prandial. Some sxs suggestive of biliary pain and mild elevated LFTs. Most significantly her HH is large enough to impinge on her L atrium and pulmonary vein precluding ablation which she needs. We discussed laparoscopic/robotic repair of her hiatal hernia with fundoplication in order to relive her current GI sxs and allow for ablation for her atrial fib. Revisited her discussion with my partner regarding the procedure and recovery in detail. She was given literature. Discussed risks of bleeding, infections, blood clots, cardiorespiratory complications, esophageal or other visceral injury, recurrence and dysphagia. Side effects of gas bloat and inability to vomit were discussed. Would reccommend cholecystectomy  at the same time due to sxs suggestive of GB. All her questions were answered and she agrees to proceed. At this point we will plan to proceed with surgery in mid-December at her request. [place] (K82.8) Story: LFTs are normal. We discussed concomitant cholecystectomy at the time of paraesophageal hernia repair as long as the paraesophageal hernia repair portion has gone well and she is not having any cardiac complications  intraoperatively. Discussed risks of surgery including bleeding, pain, scarring, intraabdominal injury specifically to the common bile duct and sequelae, bile leak, conversion to open surgery, blood clot, pneumonia, heart attack, stroke, failure to resolve symptoms, etc. Questions welcomed and answered. She is in agreement with this plan.Marland Kitchen

## 2019-03-12 NOTE — Op Note (Signed)
Operative Note  [name]  [id number]  [id number]  03/12/2019   Surgeon: Victorino Sparrow ConnorMD  Assistant: Neysa Bonito MD  Procedure performed: Mechele Claude Robotic paraesophageal hernia repair with toupet Fundoplication, robotic cholecystectomy  Preop diagnosis: paraesophageal hernia, biliary colic Post-op diagnosis/intraop findings: same  Specimens: gallbladder Retained items: none EBL: [id number] Complications: none  Description of procedure: After obtaining informed consent the patient was taken to the operating room and placed supine on operating room table wheregeneral endotracheal anesthesia was initiated, preoperative antibiotics were administered, SCDs applied, and a formal timeout was performed. The abdomen was prepped and draped in the usual sterile fashion. The peritoneal cavity was entered with a periumbilical veress needle and insufflated to 42mmHg. An 33mm trocar and camera were then placed; gross inspection revealed no evidence of injury from entry. Bilateral TAPS blocks were performed under laparoscopic visualization using exparel.  Additional 8 mm robotic trochars were placed under direct visualization. A subxiphoid incision was made and the liver retractor introduced for fixed retraction of the left lobe. The patient was then placed in steep reverse Trendelenburg, the robot docked and robotic instruments inserted under direct visualization.   The hernia sac was circumferentially dissected from the crura and out of the mediastinum using a combination of vessel sealer and blunt dissection.  Once the hernia sac and been reduced, the stomach which had been in the chest easily rested in the abdomen.  We then mobilized the esophagus from its mediastinal attachments, taking care to preserve the vagus nerves.  The pleura were not entered.  We were able to achieve at least 5 cm of intra-abdominal, tension-free esophageal length.  The short gastric vessels were divided with the vessel sealer to  prepare the fundus for our fundoplication.  The hernia sac was excised with the vessel sealer.  At this point the hiatus was closed posteriorly using simple interrupted 0 Ethibonds.  The most posterior 2 sutures utilized pledgets.  A total of 6 sutures were placed. This narrowed the hiatus nicely to about 2.5 cm in diameter.    The fundus was then brought posteriorly around the esophagus and the shoeshine maneuver performed confirming appropriate orientation.  A 270 degree posterior fundoplication was then performed using 3 simple interrupted 0 Ethibond sutures on each side.  The fundus was also pexed to the diaphragm bilaterally with a simple interrupted 0 Ethibond suture.  On completion, the esophagus rested in the abdomen without tension, the field was hemostatic, and the wrap appeared well seated and secured.  At this juncture the liver retractor was removed under direct visualization and our ports reconfigured to perform the cholecystectomy.  The gallbladder was retracted cephalad and the infundibulum retracted laterally.  Hook cautery and blunt dissection were used to clear the peritoneum from the gallbladder neck and cystic duct, exposing the cystic artery and cystic duct.  The gallbladder was lifted off the liver plate using blunt dissection and electrocautery and the critical view of safety was performed with the cystic duct, cystic artery both identified and no other structures entering the gallbladder.  Anatomy was confirmed with firefly.  The artery was ligated with a single clip proximally and cautery distally, and the cystic duct was ligated with 3 clips on the proximal end, one clip distally and then divided sharply.  The gallbladder was dissected off of the liver with cautery.  Hemostasis was ensured in the field.  The liver bed was reinspected, and there was no evidence of bile leak from the liver bed or the  cystic duct on direct inspection nor with firefly.  The right upper quadrant was  irrigated and aspirated, the effluent was clear.  The gallbladder and the excised hernia sac were placed in a Endo Catch bag and removed through the left paramedian trocar site.  The gallbladder did rupture within the bag during extraction.  Robotic instruments removed under direct visualization, the abdomen was then desufflated and all ports removed.  The skin incisions were closed with subcuticular Monocryl and Dermabond.  The patient was then awakened, extubated and will be brought to recovery and admitted for postoperative care.  All counts were correct at the completion of the case.

## 2019-03-13 ENCOUNTER — Inpatient Hospital Stay (HOSPITAL_COMMUNITY): Payer: PPO

## 2019-03-13 ENCOUNTER — Other Ambulatory Visit: Payer: Self-pay

## 2019-03-13 ENCOUNTER — Encounter: Payer: Self-pay | Admitting: *Deleted

## 2019-03-13 LAB — CBC
HCT: 43.7 % (ref 36.0–46.0)
Hemoglobin: 14.4 g/dL (ref 12.0–15.0)
MCH: 33.7 pg (ref 26.0–34.0)
MCHC: 33 g/dL (ref 30.0–36.0)
MCV: 102.3 fL — ABNORMAL HIGH (ref 80.0–100.0)
Platelets: 203 10*3/uL (ref 150–400)
RBC: 4.27 MIL/uL (ref 3.87–5.11)
RDW: 12.1 % (ref 11.5–15.5)
WBC: 7.5 10*3/uL (ref 4.0–10.5)
nRBC: 0 % (ref 0.0–0.2)

## 2019-03-13 LAB — BASIC METABOLIC PANEL
Anion gap: 11 (ref 5–15)
BUN: 12 mg/dL (ref 8–23)
CO2: 23 mmol/L (ref 22–32)
Calcium: 9.1 mg/dL (ref 8.9–10.3)
Chloride: 103 mmol/L (ref 98–111)
Creatinine, Ser: 0.77 mg/dL (ref 0.44–1.00)
GFR calc Af Amer: >60 mL/min (ref >60)
GFR calc non Af Amer: >60 mL/min (ref >60)
Glucose, Bld: 145 mg/dL — ABNORMAL HIGH (ref 70–99)
Potassium: 4.9 mmol/L (ref 3.5–5.1)
Sodium: 137 mmol/L (ref 135–145)

## 2019-03-13 LAB — MAGNESIUM: Magnesium: 2 mg/dL (ref 1.7–2.4)

## 2019-03-13 MED ORDER — ORAL CARE MOUTH RINSE
15.0000 mL | Freq: Two times a day (BID) | OROMUCOSAL | Status: DC
  Administered 2019-03-13: 15 mL via OROMUCOSAL

## 2019-03-13 MED ORDER — INFLUENZA VAC A&B SA ADJ QUAD 0.5 ML IM PRSY
0.5000 mL | PREFILLED_SYRINGE | INTRAMUSCULAR | Status: AC
  Administered 2019-03-13: 0.5 mL via INTRAMUSCULAR
  Filled 2019-03-13 (×2): qty 0.5

## 2019-03-13 MED ORDER — PNEUMOCOCCAL VAC POLYVALENT 25 MCG/0.5ML IJ INJ
0.5000 mL | INJECTION | INTRAMUSCULAR | Status: AC
  Administered 2019-03-13: 0.5 mL via INTRAMUSCULAR
  Filled 2019-03-13 (×2): qty 0.5

## 2019-03-13 MED ORDER — TRAMADOL HCL 50 MG PO TABS: 50.0000 mg | ORAL_TABLET | Freq: Four times a day (QID) | ORAL | 0 refills | Status: DC | PRN

## 2019-03-13 MED ORDER — ONDANSETRON 4 MG PO TBDP: 4.0000 mg | ORAL_TABLET | Freq: Four times a day (QID) | ORAL | 0 refills | Status: DC | PRN

## 2019-03-13 MED ORDER — ACETAMINOPHEN 500 MG PO TABS: 1000.0000 mg | ORAL_TABLET | Freq: Four times a day (QID) | ORAL | 0 refills | Status: AC | PRN

## 2019-03-13 MED ORDER — IOHEXOL 180 MG/ML  SOLN: 50.0000 mL | Freq: Once | INTRAMUSCULAR | Status: DC | PRN

## 2019-03-13 NOTE — Discharge Instructions (Signed)
EATING AFTER YOUR ESOPHAGEAL SURGERY (Stomach Fundoplication, Hiatal Hernia repair, Achalasia surgery, etc)  ######################################################################  EAT Start with a pureed / full liquid diet (see below) Gradually transition to a high fiber diet with a fiber supplement over the next month after discharge.    WALK Walk an hour a day.  Control your pain to do that.    CONTROL PAIN Control pain so that you can walk, sleep, tolerate sneezing/coughing, go up/down stairs.  HAVE A BOWEL MOVEMENT DAILY Keep your bowels regular to avoid problems.  OK to try a laxative to override constipation.  OK to use an antidairrheal to slow down diarrhea.  Call if not better after 2 tries  CALL IF YOU HAVE PROBLEMS/CONCERNS Call if you are still struggling despite following these instructions. Call if you have concerns not answered by these instructions  WOUND CARE OK to shower and pat dry. Glue will flake off after 1-2 weeks. No rubbing, scrubbing, lotions or ointments to incisions. No soaking or swimming until incisions are completely healed.  ######################################################################   After your esophageal surgery, expect some sticking with swallowing over the next 1-2 months.    If food sticks when you eat, it is called "dysphagia".  This is due to swelling around your esophagus at the wrap & hiatal diaphragm repair.  It will gradually ease off over the next few months.  To help you through this temporary phase, we start you out on a pureed (blenderized) diet.  Your first meal in the hospital was thin liquids.  You should have been given a pureed diet by the time you left the hospital.  We ask patients to stay on a pureed diet for the first 2-3 weeks to avoid anything getting "stuck" near your recent surgery.  Don't be alarmed if your ability to swallow doesn't progress according to this plan.  Everyone is different and some diets can  advance more or less quickly.    It is often helpful to crush your medications or split them as they can sometimes stick, especially the first week or so.   Some BASIC RULES to follow are:  Maintain an upright position whenever eating or drinking.  Take small bites - just a teaspoon size bite at a time.  Eat slowly.  It may also help to eat only one food at a time.  Consider nibbling through smaller, more frequent meals & avoid the urge to eat BIG meals  Do not push through feelings of fullness, nausea, or bloatedness  Do not mix solid foods and liquids in the same mouthful  Try not to "wash foods down" with large gulps of liquids.  Avoid carbonated (bubbly/fizzy) drinks.    Avoid foods that make you feel gassy or bloated.  Start with bland foods first.  Wait on trying greasy, fried, or spicy meals until you are tolerating more bland solids well.  Understand that it will be hard to burp and belch at first.  This gradually improves with time.  Expect to be more gassy/flatulent/bloated initially.  Walking will help your body manage it better.  Consider using medications for bloating that contain simethicone such as  Maalox or Gas-X   Consider crushing her medications, especially smaller pills.  The ability to swallow pills should get easier after a few weeks  Eat in a relaxed atmosphere & minimize distractions.  Avoid talking while eating.    Do not use straws.  Following each meal, sit in an upright position (90 degree angle) for 60  to 90 minutes.  Going for a short walk can help as well  If food does stick, don't panic.  Try to relax and let the food pass on its own.  Sipping WARM LIQUID such as strong hot black tea can also help slide it down.   Be gradual in changes & use common sense:  -If you easily tolerating a certain "level" of foods, advance to the next level gradually -If you are having trouble swallowing a particular food, then avoid it.   -If food is sticking  when you advance your diet, go back to thinner previous diet (the lower LEVEL) for 1-2 days.  LEVEL 1 = PUREED DIET  Do for the first 2 WEEKS AFTER SURGERY  -Foods in this group are pureed or blenderized to a smooth, mashed potato-like consistency.  -If necessary, the pureed foods can keep their shape with the addition of a thickening agent.   -Meat should be pureed to a smooth, pasty consistency.  Hot broth or gravy may be added to the pureed meat, approximately 1 oz. of liquid per 3 oz. serving of meat. -CAUTION:  If any foods do not puree into a smooth consistency, swallowing will be more difficult.  (For example, nuts or seeds sometimes do not blend well.)  Hot Foods Cold Foods  Pureed scrambled eggs and cheese Pureed cottage cheese  Baby cereals Thickened juices and nectars  Thinned cooked cereals (no lumps) Thickened milk or eggnog  Pureed [place] toast or pancakes Ensure  Mashed potatoes Ice cream  Pureed parsley, au gratin, scalloped potatoes, candied sweet potatoes Fruit or [place] ice, sherbet  Pureed buttered or alfredo noodles Plain yogurt  Pureed vegetables (no corn or peas) Instant breakfast  Pureed soups and creamed soups Smooth pudding, mousse, custard  Pureed scalloped apples Whipped gelatin  Gravies Sugar, syrup, honey, jelly  Sauces, cheese, tomato, barbecue, white, creamed Cream  Any baby food Creamer  Alcohol in moderation (not beer or champagne) Margarine  Coffee or tea Mayonnaise   Ketchup, mustard   Apple sauce   SAMPLE MENU:  PUREED DIET Breakfast Lunch Dinner   Orange juice, 1/2 cup  Cream of wheat, 1/2 cup  Pineapple juice, 1/2 cup  Pureed [place], barley soup, 3/4 cup  Pureed Hawaiian chicken, 3 oz   Scrambled eggs, mashed or blended with cheese, 1/2 cup  Tea or coffee, 1 cup   Whole milk, 1 cup   Non-dairy creamer, 2 Tbsp.  Mashed potatoes, 1/2 cup  Pureed cooled broccoli, 1/2 cup  Apple sauce, 1/2 cup  Coffee or tea  Mashed potatoes,  1/2 cup  Pureed spinach, 1/2 cup  Frozen yogurt, 1/2 cup  Tea or coffee      LEVEL 2 = SOFT DIET  After your first 2 weeks, you can advance to a soft diet.   Keep on this diet until everything goes down easily.  Hot Foods Cold Foods  White fish Cottage cheese  Stuffed fish Junior baby fruit  Baby food meals Semi thickened juices  Minced soft cooked, scrambled, poached eggs nectars  Souffle & omelets Ripe mashed bananas  Cooked cereals Canned fruit, pineapple sauce, milk  potatoes Milkshake  Buttered or Alfredo noodles Custard  Cooked cooled vegetable Puddings, including tapioca  Sherbet Yogurt  Vegetable soup or alphabet soup Fruit ice, [place] ice  Gravies Whipped gelatin  Sugar, syrup, honey, jelly Junior baby desserts  Sauces:  Cheese, creamed, barbecue, tomato, white Cream  Coffee or tea Margarine   SAMPLE MENU:  LEVEL 2 Breakfast Lunch Dinner   Orange juice, 1/2 cup  Oatmeal, 1/2 cup  Scrambled eggs with cheese, 1/2 cup  Decaffeinated tea, 1 cup  Whole milk, 1 cup  Non-dairy creamer, 2 Tbsp  Pineapple juice, 1/2 cup  Minced beef, 3 oz  Gravy, 2 Tbsp  Mashed potatoes, 1/2 cup  Minced fresh broccoli, 1/2 cup  Applesauce, 1/2 cup  Coffee, 1 cup  [place], barley soup, 3/4 cup  Minced Hawaiian chicken, 3 oz  Mashed potatoes, 1/2 cup  Cooked spinach, 1/2 cup  Frozen yogurt, 1/2 cup  Non-dairy creamer, 2 Tbsp      LEVEL 3 = CHOPPED DIET  -After all the foods in level 2 (soft diet) are passing through well you should advance up to more chopped foods.  -It is still important to cut these foods into small pieces and eat slowly.  Hot Foods Cold Foods  Poultry Cottage cheese  Chopped Swedish meatballs Yogurt  Meat salads (ground or flaked meat) Milk  Flaked fish (tuna) Milkshakes  Poached or scrambled eggs Soft, cold, dry cereal  Souffles and omelets Fruit juices or nectars  Cooked cereals Chopped canned fruit  Chopped [place] toast or  pancakes Canned fruit cocktail  Noodles or pasta (no rice) Pudding, mousse, custard  Cooked vegetables (no frozen peas, corn, or mixed vegetables) Green salad  Canned small sweet peas Ice cream  Creamed soup or vegetable soup Fruit ice, [place] ice  Pureed vegetable soup or alphabet soup Non-dairy creamer  Ground scalloped apples Margarine  Gravies Mayonnaise  Sauces:  Cheese, creamed, barbecue, tomato, white Ketchup  Coffee or tea Mustard   SAMPLE MENU:  LEVEL 3 Breakfast Lunch Dinner   Orange juice, 1/2 cup  Oatmeal, 1/2 cup  Scrambled eggs with cheese, 1/2 cup  Decaffeinated tea, 1 cup  Whole milk, 1 cup  Non-dairy creamer, 2 Tbsp  Ketchup, 1 Tbsp  Margarine, 1 tsp  Salt, 1/4 tsp  Sugar, 2 tsp  Pineapple juice, 1/2 cup  Ground beef, 3 oz  Gravy, 2 Tbsp  Mashed potatoes, 1/2 cup  Cooked spinach, 1/2 cup  Applesauce, 1/2 cup  Decaffeinated coffee  Whole milk  Non-dairy creamer, 2 Tbsp  Margarine, 1 tsp  Salt, 1/4 tsp  Pureed [place], barley soup, 3/4 cup  Barbecue chicken, 3 oz  Mashed potatoes, 1/2 cup  Ground fresh broccoli, 1/2 cup  Frozen yogurt, 1/2 cup  Decaffeinated tea, 1 cup  Non-dairy creamer, 2 Tbsp  Margarine, 1 tsp  Salt, 1/4 tsp  Sugar, 1 tsp    LEVEL 4:  REGULAR FOODS  -Foods in this group are soft, moist, regularly textured foods.   -This level includes meat and breads, which tend to be the hardest things to swallow.   -Eat very slowly, chew well and continue to avoid carbonated drinks. -most people are at this level in 4-6 weeks  Hot Foods Cold Foods  Baked fish or skinned Soft cheeses - cottage cheese  Souffles and omelets Cream cheese  Eggs Yogurt  Stuffed shells Milk  Spaghetti with meat sauce Milkshakes  Cooked cereal Cold dry cereals (no nuts, dried fruit, coconut)  [place] toast or pancakes Crackers  Buttered toast Fruit juices or nectars  Noodles or pasta (no rice) Canned fruit  Potatoes (all types)  Ripe bananas  Soft, cooked vegetables (no corn, lima, or baked beans) Peeled, ripe, fresh fruit  Creamed soups or vegetable soup Cakes (no nuts, dried fruit, coconut)  Canned chicken noodle soup Plain doughnuts  Gravies Ice  cream  Bacon dressing Pudding, mousse, custard  Sauces:  Cheese, creamed, barbecue, tomato, white Fruit ice, [place] ice, sherbet  Decaffeinated tea or coffee Whipped gelatin  Pork chops Regular gelatin   Canned fruited gelatin molds   Sugar, syrup, honey, jam, jelly   Cream   Non-dairy   Margarine   Oil   Mayonnaise   Ketchup   Mustard   TROUBLESHOOTING IRREGULAR BOWELS  1) Avoid extremes of bowel movements (no bad constipation/diarrhea)  2) Miralax 17gm mixed in 8oz. water or juice-daily. May use BID as needed.  3) Gas-x,Phazyme, etc. as needed for gas & bloating.  4) Soft,bland diet. No spicy,greasy,fried foods.  5) Prilosec over-the-counter as needed  6) May hold gluten/wheat products from diet to see if symptoms improve.  7) May try probiotics (Align, Activa, etc) to help calm the bowels down  7) If symptoms become worse call back immediately.    If you have any questions please call our office at [place]: [phone].

## 2019-03-13 NOTE — Discharge Summary (Signed)
Physician Discharge Summary  Patient ID: [name] MRN: [id number] DOB/AGE: 1950/07/15 68 y.o.  Admit date: 03/12/2019 Discharge date: 03/13/2019  Admission Diagnoses: Paraesophageal hernia, biliary colic, atrial fibrillation  Discharge Diagnoses: status post robotic paraesophageal hernia repair with toupet fundoplication, cholecystectomy   Discharged Condition: good  Hospital Course: she was admitted for postoperative car following the above procedure.  She was observed on cardiac monitoring and had no afib overnight.  Her pain was well controlled with oral/nonnarcotic medications and she was able to tolerate clear liquids on the evening of surgery. Had no nausea, dysphagia, or reflux by the morning of postop day 1. She was advanced to a pured diet which she tolerated at lunchtime.  She underwent an upper GI series which confirms appropriate post-op anatomy. She was ambulating well and vital signs remained stable. Her hgb was stable post-op. She was deemed fit for discharge home.    Discharge Exam: Blood pressure (!) 130/54, pulse 76, temperature 98.3 F (36.8 C), temperature source Oral, resp. rate (!) 21, height 5\' 9"  (1.753 m), weight 111.4 kg, SpO2 91 %. See rounding note  Disposition: Discharge disposition: 01-Home or Self Care       Discharge Instructions     Call MD for:  difficulty breathing, headache or visual disturbances   Complete by: As directed    Call MD for:  extreme fatigue   Complete by: As directed    Call MD for:  hives   Complete by: As directed    Call MD for:  persistant dizziness or light-headedness   Complete by: As directed    Call MD for:  persistant nausea and vomiting   Complete by: As directed    Call MD for:  redness, tenderness, or signs of infection (pain, swelling, redness, odor or green/yellow discharge around incision site)   Complete by: As directed    Call MD for:  severe uncontrolled pain   Complete by: As directed    Call MD  for:  temperature >100.4   Complete by: As directed    Driving Restrictions   Complete by: As directed    No driving wile taking pain medications   Increase activity slowly   Complete by: As directed    Lifting restrictions   Complete by: As directed    No lifting more than 20lb for 6 weeks      Allergies as of 03/13/2019       Reactions   Hydrolyzed Silk Hives, Other (See Comments)   Silk tape-blisters   Ciprofloxacin    Reactions with antiarrythmic   Gluten Meal Other (See Comments)   reflux        Medication List     TAKE these medications    acetaminophen 500 MG tablet Commonly known as: TYLENOL Take 2 tablets (1,000 mg total) by mouth every 6 (six) hours as needed.   apixaban 5 MG Tabs tablet Commonly known as: Eliquis Take 1 tablet (5 mg total) by mouth 2 (two) times daily. Notes to patient: RESUME ON 03/14/19   APPLE CIDER VINEGAR PO Take 1,200 mg by mouth 2 (two) times daily.   Cranberry 500 MG Tabs Take 1,000 mg by mouth 2 (two) times daily.   cyclobenzaprine 5 MG tablet Commonly known as: FLEXERIL Take 1 tablet (5 mg total) by mouth 3 (three) times daily as needed for muscle spasms.   diltiazem 240 MG 24 hr capsule Commonly known as: TIAZAC TAKE 1 CAPSULE BY MOUTH EVERY DAY What changed: how  much to take   diltiazem 60 MG tablet Commonly known as: Cardizem Take 60 mg daily if needed for palpitations What changed:  how much to take how to take this when to take this reasons to take this additional instructions   flecainide 100 MG tablet Commonly known as: TAMBOCOR Take 1.5 tablets (150 mg total) by mouth 2 (two) times daily.   methenamine 1 g tablet Commonly known as: MANDELAMINE Take 500 mg by mouth 2 (two) times daily.   Myrbetriq 50 MG Tb24 tablet Generic drug: mirabegron ER Take 25 mg by mouth every other day.   ondansetron 4 MG disintegrating tablet Commonly known as: ZOFRAN-ODT Take 1 tablet (4 mg total) by mouth every 6  (six) hours as needed for nausea.   OVER THE COUNTER MEDICATION Take 20 mg by mouth daily as needed (arthritis). CBD oil   pantoprazole 40 MG tablet Commonly known as: PROTONIX TAKE 1 TABLET(40 MG) BY MOUTH DAILY What changed: See the new instructions.   polyethylene glycol 17 g packet Commonly known as: MIRALAX / GLYCOLAX Take 17 g by mouth daily. What changed:  when to take this reasons to take this   senna-docusate 8.6-50 MG tablet Commonly known as: Senokot-S Take 1 tablet by mouth at bedtime. What changed:  when to take this reasons to take this   spironolactone 50 MG tablet Commonly known as: ALDACTONE Take 2 tablets (100 mg total) by mouth every morning AND 1 tablet (50 mg total) every evening.   traMADol 50 MG tablet Commonly known as: ULTRAM Take 1 tablet (50 mg total) by mouth every 6 (six) hours as needed (mild pain).   triamcinolone cream 0.1 % Commonly known as: KENALOG Apply 1 application topically daily as needed (for dry skin).   vitamin C 500 MG tablet Commonly known as: ASCORBIC ACID Take 500 mg by mouth 2 (two) times daily.       Follow-up Information     Clovis Riley, MD In 3 weeks.   Specialty: General Surgery Contact information: [address] [place] [address] [phone]            Signed: Clovis Riley 03/13/2019, 1:52 PM

## 2019-03-13 NOTE — Plan of Care (Signed)
Patient admitted to room 1230, alert and oriented with no complaints of pain.

## 2019-03-13 NOTE — Progress Notes (Signed)
1 Day Post-Op   Subjective/Chief Complaint: Feels well. Tolerating liquids without nausea, dysphagia or sticking. No abdominal pain, some left shoulder pain from gas. Walking in halls. No issues with afib overnight.    Objective: Vital signs in last 24 hours: Temp:  [97.4 F (36.3 C)-98.1 F (36.7 C)] 97.6 F (36.4 C) (12/30 0400) Pulse Rate:  [66-85] 73 (12/30 0400) Resp:  [12-22] 20 (12/30 0400) BP: (117-146)/(41-103) 129/52 (12/30 0400) SpO2:  [91 %-98 %] 97 % (12/30 0400) Weight:  [111.4 kg] 111.4 kg (12/30 0051) Last BM Date: 03/12/19  Intake/Output from previous day: 12/29 0701 - 12/30 0700 In: 3755.9 [P.O.:750; I.V.:2905.9; IV Piggyback:100] Out: [id number] [Urine:4905; Blood:150] Intake/Output this shift: No intake/output data recorded.  General appearance: alert Resp: unlabored Cardio: regular rate and rhythm GI: soft, nontender, incisions c/d/i with dermabond no cellulitis or hematoma  Lab Results:  Recent Labs    03/13/19 0204  WBC 7.5  HGB 14.4  HCT 43.7  PLT 203   BMET Recent Labs    03/13/19 0204  NA 137  K 4.9  CL 103  CO2 23  GLUCOSE 145*  BUN 12  CREATININE 0.77  CALCIUM 9.1   PT/INR No results for input(s): LABPROT, INR in the last 72 hours. ABG No results for input(s): PHART, HCO3 in the last 72 hours.  Invalid input(s): PCO2, PO2  Studies/Results: No results found.  Anti-infectives: Anti-infectives (From admission, onward)   Start     Dose/Rate Route Frequency Ordered Stop   03/12/19 2200  methenamine (MANDELAMINE) tablet 500 mg     500 mg Oral 2 times daily 03/12/19 2046     03/12/19 1030  ceFAZolin (ANCEF) IVPB 2g/100 mL premix     2 g 200 mL/hr over 30 Minutes Intravenous On call to O.R. 03/12/19 1022 03/12/19 1210      Assessment/Plan: s/p Procedure(s): XI ROBOTIC ASSISTED PARAESOPHAGEAL HERNIA REPAIR AND toupet FUNDOPLICATION (N/A) XI ROBOTIC ASSISTED CHOLECYSTECTOMY (N/A)  03/12/19  UGI today Advance to puree  diet Continue mobilizing and pulm toilet Continue home medication regimen Plan to resume eliquis tomorrow morning  Plan discharge this afternoon vs tomorrow depending on clinical progress   LOS: 1 day    Clovis Riley 03/13/2019

## 2019-03-14 LAB — SURGICAL PATHOLOGY

## 2019-03-29 ENCOUNTER — Other Ambulatory Visit: Payer: Self-pay | Admitting: Family Medicine

## 2019-03-29 ENCOUNTER — Other Ambulatory Visit: Payer: Self-pay | Admitting: Internal Medicine

## 2019-04-15 ENCOUNTER — Other Ambulatory Visit: Payer: Self-pay | Admitting: Internal Medicine

## 2019-04-23 DIAGNOSIS — L570 Actinic keratosis: Secondary | ICD-10-CM | POA: Diagnosis not present

## 2019-04-23 DIAGNOSIS — L814 Other melanin hyperpigmentation: Secondary | ICD-10-CM | POA: Diagnosis not present

## 2019-04-23 DIAGNOSIS — Z85820 Personal history of malignant melanoma of skin: Secondary | ICD-10-CM | POA: Diagnosis not present

## 2019-04-23 DIAGNOSIS — L821 Other seborrheic keratosis: Secondary | ICD-10-CM | POA: Diagnosis not present

## 2019-04-23 DIAGNOSIS — D485 Neoplasm of uncertain behavior of skin: Secondary | ICD-10-CM | POA: Diagnosis not present

## 2019-04-23 DIAGNOSIS — L578 Other skin changes due to chronic exposure to nonionizing radiation: Secondary | ICD-10-CM | POA: Diagnosis not present

## 2019-04-23 DIAGNOSIS — D225 Melanocytic nevi of trunk: Secondary | ICD-10-CM | POA: Diagnosis not present

## 2019-05-07 ENCOUNTER — Other Ambulatory Visit: Payer: Self-pay | Admitting: Cardiology

## 2019-05-07 ENCOUNTER — Telehealth: Payer: Self-pay | Admitting: Licensed Clinical Social Worker

## 2019-05-07 NOTE — Telephone Encounter (Signed)
Received a genetic counseling referral from [facility] for sebaceous adenoma. [name] has been cld and scheduled for a virtual genetic counseling appt w/Brianna on 3/8 at 11am.

## 2019-05-09 ENCOUNTER — Other Ambulatory Visit: Payer: Self-pay | Admitting: Internal Medicine

## 2019-05-20 ENCOUNTER — Ambulatory Visit (HOSPITAL_BASED_OUTPATIENT_CLINIC_OR_DEPARTMENT_OTHER): Payer: PPO | Admitting: Licensed Clinical Social Worker

## 2019-05-20 ENCOUNTER — Encounter: Payer: Self-pay | Admitting: Licensed Clinical Social Worker

## 2019-05-20 DIAGNOSIS — Z8049 Family history of malignant neoplasm of other genital organs: Secondary | ICD-10-CM | POA: Diagnosis not present

## 2019-05-20 DIAGNOSIS — Z800 Family history of malignant neoplasm of digestive organs: Secondary | ICD-10-CM | POA: Diagnosis not present

## 2019-05-20 DIAGNOSIS — Z8542 Personal history of malignant neoplasm of other parts of uterus: Secondary | ICD-10-CM

## 2019-05-20 DIAGNOSIS — Z85820 Personal history of malignant melanoma of skin: Secondary | ICD-10-CM

## 2019-05-20 DIAGNOSIS — Z86018 Personal history of other benign neoplasm: Secondary | ICD-10-CM

## 2019-05-20 NOTE — Progress Notes (Signed)
REFERRING PROVIDER: Devra Dopp, MD [place], [place],  [place] [address]  PRIMARY PROVIDER:  Lucretia Kern, DO  PRIMARY REASON FOR VISIT:  1. History of melanoma   2. Family history of uterine cancer   3. Family history of colon cancer   4. History of uterine cancer   5. History of sebaceous adenoma    I connected with [name] on 05/20/2019 at 10:50 AM EDT by MyChart video conference and verified that I am speaking with the correct person using two identifiers.    Patient location: home Provider location: [place]:   [name], a 69 y.o. female, was seen for a [facility] cancer genetics consultation at the request of Dr. Renda Rolls due to her personal and family history of cancer.  [name] presents to clinic today to discuss the possibility of a hereditary predisposition to cancer, genetic testing, and to further clarify her future cancer risks, as well as potential cancer risks for family members.   At the age of 69, [name] was diagnosed with melanoma, this was removed. At the age of 69, [name] was diagnosed with uterine cancer. This was treated with hysterectomy. At the age of 69, [name] had another melanoma removed. At the age of 69, she had a sebaceous adenoma removed from her right cheek.   CANCER HISTORY:  Oncology History   No history exists.     RISK FACTORS:  Menarche was at age 25.  First live birth at age 49.  OCP use for approximately 10 years.  Ovaries intact: no.  Hysterectomy: yes.  Menopausal status: postmenopausal.  HRT use: 0 years. Colonoscopy: yes; polyps removed, next cscope in 2021 with Dr. Ardis Hughs. Mammogram within the last year: yes. Number of breast biopsies: 0. Up to date with pelvic exams: yes. Any excessive radiation exposure in the past: no  Past Medical History:  Diagnosis Date  . Chronic edema   . Constipation   . Difficult intubation    pt states that her neck needs  to be in a neutral position,  scoliosisand herniated dics in neck  . Family history of colon cancer   . Family history of uterine cancer   . GERD (gastroesophageal reflux disease)    protonix, hx h. pylori  . Herniated disc, cervical   . History of hiatal hernia   . History of sebaceous adenoma   . History of uterine cancer 04/22/2014   sees Dr. Ronita Hipps; s/p complete hysterectomy  . History of uterine cancer   . Hx of colonic polyp   . 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 Melanoma [facility])    sees Dr. Renda Rolls in dermatology right upper arm  . Obesity   . Paroxysmal atrial fibrillation (HCC)   . Peripheral vascular disease ([place])   . PONV (postoperative nausea and vomiting)   . Recurrent UTI   . S/P hip replacement   . Scoliosis   . Thyroid nodule   . Urinary incontinence   . Uterine cancer (HCC)    Stage 1  . Venous insufficiency    s/p ablation    Past Surgical History:  Procedure Laterality Date  . ABDOMINAL HYSTERECTOMY  09/10/2013  . BUNIONECTOMY  10/1976  . CHOLECYSTECTOMY N/A 03/12/2019   Procedure: XI ROBOTIC ASSISTED CHOLECYSTECTOMY;  Surgeon: Clovis Riley, MD;  Location: [facility];  Service: General;  Laterality: N/A;  . COLONOSCOPY    . FRACTURE SURGERY  09/1964   floor of orbit right side  . JOINT REPLACEMENT     bilateral hip replacements  . MELANOMA EXCISION  12/2011  . TOTAL HIP ARTHROPLASTY Left 06/17/2014   Procedure: LEFT TOTAL HIP ARTHROPLASTY ANTERIOR APPROACH;  Surgeon: Mcarthur Rossetti, MD;  Location: [facility];  Service: Orthopedics;  Laterality: Left;  . TOTAL HIP ARTHROPLASTY Right 09/02/2014   Procedure: RIGHT TOTAL HIP ARTHROPLASTY ANTERIOR APPROACH;  Surgeon: Mcarthur Rossetti, MD;  Location: [facility];  Service: Orthopedics;  Laterality: Right;  . VEIN SURGERY  05/2011   venous ablation     Social History   Socioeconomic History  . Marital status: Divorced    Spouse name: Not on file  . Number of children: 2  . Years of education:  JD  . Highest education level: Not on file  Occupational History  . Occupation: Chief Executive Officer  Tobacco Use  . Smoking status: Never Smoker  . Smokeless tobacco: Never Used  Substance and Sexual Activity  . Alcohol use: Yes    Alcohol/week: 0.0 standard drinks    Comment: rarely at [facility]   . Drug use: No  . Sexual activity: Not on file  Other Topics Concern  . Not on file  Social History Narrative   Work or School: retired Forensic psychologist      Home Situation: lives alone - takes care of twin grandchildren 7 months in 05/2015      Spiritual Beliefs:       Lifestyle: active, healthy diet      Social Determinants of Health   Financial Resource Strain:   . Difficulty of Paying Living Expenses: Not on file  Food Insecurity:   . Worried About Charity fundraiser in the Last Year: Not on file  . Ran Out of Food in the Last Year: Not on file  Transportation Needs:   . Lack of Transportation (Medical): Not on file  . Lack of Transportation (Non-Medical): Not on file  Physical Activity:   . Days of Exercise per Week: Not on file  . Minutes of Exercise per Session: Not on file  Stress:   . Feeling of Stress : Not on file  Social Connections:   . Frequency of Communication with Friends and Family: Not on file  . Frequency of Social Gatherings with Friends and Family: Not on file  . Attends Religious Services: Not on file  . Active Member of Clubs or Organizations: Not on file  . Attends Archivist Meetings: Not on file  . Marital Status: Not on file     FAMILY HISTORY:  We obtained a detailed, 4-generation family history.  Significant diagnoses are listed below: Family History  Problem Relation Age of Onset  . Uterine cancer Mother   . Diabetes Brother 51  . Hypertension Father 31  . Hyperlipidemia Father   . Cancer Father 37       colon and leukemia  . Epilepsy Father 84  . Arthritis Sister 69  . Cancer Maternal Grandmother        unk type possibly breast or skin  .  Other Paternal Grandmother        influenza   . Stroke Paternal Grandfather   . Cancer Paternal Uncle        unk type  . Skin cancer Daughter        SCC on back   [name] has 2 daughters, ages 19 and 70. Her youngest daughter has had squamous cell carcinoma removed from  her back. Ms. Mentor has a brother and a sister, no cancers .  Ms. Innis's mother was diagnosed with uterine cancer at 73 and died at 64. Patient does not have maternal aunts/uncles/first cousins. Maternal grandmother had cancer in her 61s but is unsure the type, possibly skin or breast. No information about her maternal grandfather.  Ms. Landers father is was diagnosed with colon cancer and leukemia in his 98s and died at 29 of a heart attack. Patient had 1 paternal uncle who also had cancer, unknown type. No known cancers in paternal cousins. Paternal grandmother died young of the flu, and grandfather died of a stroke.  Ms. Moncivais is unaware of previous family history of genetic testing for hereditary cancer risks. Patient's maternal ancestors are of [place] and [place] descent, and paternal ancestors are of [place] and [place] descent. There is no reported Ashkenazi Jewish ancestry. There is no known consanguinity.  GENETIC COUNSELING ASSESSMENT: Ms. Dinkel is a 69 y.o. female with a personal and family history which is somewhat suggestive of a hereditary cancer syndrome such as Muir-Torre syndrome/Lynch syndrome and predisposition to cancer. We, therefore, discussed and recommended the following at today's visit.   DISCUSSION: We discussed that 5 - 10% of cancer is hereditary. We discussed that sebaceous adenomas can be seen in a form of Lynch syndrome called Muir-Torre syndrome, and that her personal and family history of cancers is somewhat suggestive of this condition as well.  There are other genes that can be associated with hereditary uterine/colon cancer syndromes, and genes associated with melanoma as well.  We discussed that  testing is beneficial for several reasons including  knowing how to follow individuals for cancer screenings and understand if other family members could be at risk for cancer and allow them to undergo genetic testing.   We reviewed the characteristics, features and inheritance patterns of hereditary cancer syndromes. We also discussed genetic testing, including the appropriate family members to test, the process of testing, insurance coverage and turn-around-time for results. We discussed the implications of a negative, positive and/or variant of uncertain significant result. We recommended Ms. Klett pursue genetic testing for the [facility] Common Hereditary Cancers Panel + Melanoma gene panel.   The Common Hereditary Cancers Panel offered by [facility] includes sequencing and/or deletion duplication testing of the following 48 genes: APC, ATM, AXIN2, BARD1, BMPR1A, BRCA1, BRCA2, BRIP1, CDH1, CDKN2A (p14ARF), CDKN2A (p16INK4a), CKD4, CHEK2, CTNNA1, DICER1, EPCAM (Deletion/duplication testing only), GREM1 (promoter region deletion/duplication testing only), KIT, MEN1, MLH1, MSH2, MSH3, MSH6, MUTYH, NBN, NF1, NHTL1, PALB2, PDGFRA, PMS2, POLD1, POLE, PTEN, RAD50, RAD51C, RAD51D, RNF43, SDHB, SDHC, SDHD, SMAD4, SMARCA4. STK11, TP53, TSC1, TSC2, and VHL.  The following genes were evaluated for sequence changes only: SDHA and HOXB13 c.251G>A variant only.  The [facility] Melanoma Panel analyzed the following 9 genes: BAP1 BRCA2 CDK4 CDKN2A MITF POT1 PTEN RB1 Tp53.  Based on Ms. Buitron's personal and family history of cancer, she meets medical criteria for genetic testing. Despite that she meets criteria, she may still have an out of pocket cost.   Ms. Quinones had questions about insurance for her daughters should they need testing. We discussed that some people do not want to undergo genetic testing due to fear of genetic discrimination.  A federal law called the Genetic Information Non-Discrimination Act (GINA) of 2008  helps protect individuals against genetic discrimination based on their genetic test results.  It impacts both health insurance and employment.  For health insurance, it protects against increased premiums, being  kicked off insurance or being forced to take a test in order to be insured.  For employment it protects against hiring, firing and promoting decisions based on genetic test results.  Health status due to a cancer diagnosis is not protected under GINA.  This law does not protect life insurance, disability insurance, or other types of insurance.   PLAN: After considering the risks, benefits, and limitations, Ms. Tonie Griffith provided informed consent to pursue genetic testing. A saliva kit was mailed to her and the sample will be sent to [facility] for analysis of the Common Hereditary Cancers Panel + Melanoma Panel. Results should be available within approximately 2-3 weeks' time, at which point they will be disclosed by telephone to Ms. Sozio, as will any additional recommendations warranted by these results. Ms. Forge will receive a summary of her genetic counseling visit and a copy of her results once available. This information will also be available in Epic.   Ms. Fite questions were answered to her satisfaction today. Our contact information was provided should additional questions or concerns arise. Thank you for the referral and allowing [place] to share in the care of your patient.   Faith Rogue, MS, [facility] Genetic Counselor [email or web address] Phone: [phone]  The patient was seen for a total of 35 minutes in face-to-face genetic counseling.  Drs. Magrinat, Lindi Adie and/or Burr Medico were available for discussion regarding this case.   _______________________________________________________________________ For Office Staff:  Number of people involved in session: 1 Was an Intern/ student involved with case: no

## 2019-05-27 ENCOUNTER — Ambulatory Visit: Payer: PPO | Admitting: Cardiology

## 2019-05-27 ENCOUNTER — Other Ambulatory Visit: Payer: Self-pay

## 2019-05-27 ENCOUNTER — Encounter: Payer: Self-pay | Admitting: Cardiology

## 2019-05-27 VITALS — BP 124/68 | HR 80 | Ht 69.0 in | Wt 242.0 lb

## 2019-05-27 DIAGNOSIS — I480 Paroxysmal atrial fibrillation: Secondary | ICD-10-CM | POA: Diagnosis not present

## 2019-05-27 NOTE — Progress Notes (Signed)
Electrophysiology Office Note   Date:  05/27/2019   ID:  [name], DOB Dec 02, 1950, MRN [id number]  PCP:  Lucretia Kern, DO  Cardiologist: [facility] Primary Electrophysiologist:  Dr Curt Bears    CC: Evaluation for atrial fibrillation   History of Present Illness: [name] is a 69 y.o. female who is being seen today for the evaluation of atrial fibrillation at the request of Lucretia Kern, DO. Presenting today for electrophysiology evaluation.  Her atrial fibrillation was initially noted in 2012 and initially controlled with diltiazem.  She was admitted in December 2019 with multifocal pneumonia and acute onset of atrial fibrillation with rapid rates.  Heart rates were as high as the 180s despite being on flecainide and Cardizem.  She spontaneously converted to sinus rhythm.  She has had multiple episodes of tachycardia at home since that time.  She was initially plan for AF ablation but was found to have a large hiatal hernia and is now status post hernia repair.  Today, denies symptoms of palpitations, chest pain, shortness of breath, orthopnea, PND, lower extremity edema, claudication, dizziness, presyncope, syncope, bleeding, or neurologic sequela. The patient is tolerating medications without difficulties.  Overall she is doing well.  She had surgery for her hiatal hernia.  Since that time, she has had less reflux.  She has had less shortness of breath with exertion.  She has noted no further episodes of atrial fibrillation.  She has done well without complaint.   Past Medical History:  Diagnosis Date  . Chronic edema   . Constipation   . Difficult intubation    pt states that her neck needs to be in a neutral position,  scoliosisand herniated dics in neck  . Family history of colon cancer   . Family history of uterine cancer   . GERD (gastroesophageal reflux disease)    protonix, hx h. pylori  . Herniated disc, cervical   . History of hiatal hernia   . History of sebaceous  adenoma   . History of uterine cancer 04/22/2014   sees Dr. Ronita Hipps; s/p complete hysterectomy  . History of uterine cancer   . Hx of colonic polyp   . 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 Melanoma [facility])    sees Dr. Renda Rolls in dermatology right upper arm  . Obesity   . Paroxysmal atrial fibrillation (HCC)   . Peripheral vascular disease ([place])   . PONV (postoperative nausea and vomiting)   . Recurrent UTI   . S/P hip replacement   . Scoliosis   . Thyroid nodule   . Urinary incontinence   . Uterine cancer (HCC)    Stage 1  . Venous insufficiency    s/p ablation   Past Surgical History:  Procedure Laterality Date  . ABDOMINAL HYSTERECTOMY  09/10/2013  . BUNIONECTOMY  10/1976  . CHOLECYSTECTOMY N/A 03/12/2019   Procedure: XI ROBOTIC ASSISTED CHOLECYSTECTOMY;  Surgeon: Clovis Riley, MD;  Location: [facility];  Service: General;  Laterality: N/A;  . COLONOSCOPY    . FRACTURE SURGERY  09/1964   floor of orbit right side  . JOINT REPLACEMENT     bilateral hip replacements  . MELANOMA EXCISION  12/2011  . TOTAL HIP ARTHROPLASTY Left 06/17/2014   Procedure: LEFT TOTAL HIP ARTHROPLASTY ANTERIOR APPROACH;  Surgeon: Mcarthur Rossetti, MD;  Location: [place];  Service: Orthopedics;  Laterality: Left;  . TOTAL HIP ARTHROPLASTY Right 09/02/2014   Procedure: RIGHT TOTAL HIP ARTHROPLASTY ANTERIOR APPROACH;  Surgeon:  Mcarthur Rossetti, MD;  Location: [place];  Service: Orthopedics;  Laterality: Right;  . VEIN SURGERY  05/2011   venous ablation      Current Outpatient Medications  Medication Sig Dispense Refill  . acetaminophen (TYLENOL) 500 MG tablet Take 2 tablets (1,000 mg total) by mouth every 6 (six) hours as needed.  0  . apixaban (ELIQUIS) 5 MG TABS tablet Take 1 tablet (5 mg total) by mouth 2 (two) times daily. 180 tablet 1  . APPLE CIDER VINEGAR PO Take 1,200 mg by mouth 2 (two) times daily.    . Cranberry 500 MG TABS Take 1,000 mg by mouth 2 (two) times daily.    .  cyclobenzaprine (FLEXERIL) 5 MG tablet Take 1 tablet (5 mg total) by mouth 3 (three) times daily as needed for muscle spasms. 20 tablet 0  . diltiazem (CARDIZEM) 60 MG tablet Take 60 mg daily if needed for palpitations (Patient taking differently: Take 60 mg by mouth as needed (Palpitation for afib). Take 60 mg daily if needed for palpitation) 30 tablet 9  . diltiazem (TIAZAC) 240 MG 24 hr capsule Take 1 capsule (240 mg total) by mouth daily. Please make annual appt with Dr. Debara Pickett for refills. [phone]. 1st attempt. 30 capsule 0  . flecainide (TAMBOCOR) 100 MG tablet TAKE 1 AND 1/2 TABLETS(150 MG) BY MOUTH TWICE DAILY 90 tablet 5  . methenamine (MANDELAMINE) 1 g tablet Take 500 mg by mouth 2 (two) times daily.     Marland Kitchen MYRBETRIQ 25 MG TB24 tablet Take 25 mg by mouth every other day.    . ondansetron (ZOFRAN-ODT) 4 MG disintegrating tablet Take 1 tablet (4 mg total) by mouth every 6 (six) hours as needed for nausea. 20 tablet 0  . OVER THE COUNTER MEDICATION Take 20 mg by mouth daily as needed (arthritis). CBD oil    . pantoprazole (PROTONIX) 40 MG tablet TAKE 1 TABLET(40 MG) BY MOUTH DAILY 90 tablet 0  . polyethylene glycol (MIRALAX / GLYCOLAX) packet Take 17 g by mouth daily. (Patient taking differently: Take 17 g by mouth daily as needed for moderate constipation. ) 14 each 0  . senna-docusate (SENOKOT-S) 8.6-50 MG tablet Take 1 tablet by mouth at bedtime. (Patient taking differently: Take 1 tablet by mouth at bedtime as needed for mild constipation or moderate constipation. ) 30 tablet 0  . spironolactone (ALDACTONE) 50 MG tablet TAKE 2 TABLETS BY MOUTH EVERY MORNING AND 1 TABLET EVERY EVENING 270 tablet 3  . traMADol (ULTRAM) 50 MG tablet Take 1 tablet (50 mg total) by mouth every 6 (six) hours as needed (mild pain). 10 tablet 0  . triamcinolone cream (KENALOG) 0.1 % Apply 1 application topically daily as needed (for dry skin).   1  . vitamin C (ASCORBIC ACID) 500 MG tablet Take 500 mg by mouth 2  (two) times daily.      No current facility-administered medications for this visit.    Allergies:   Hydrolyzed silk, Ciprofloxacin, and Gluten meal   Social History:  The patient  reports that she has never smoked. She has never used smokeless tobacco. She reports current alcohol use. She reports that she does not use drugs.   Family History:  The patient's family history includes Arthritis (age of onset: 76) in her sister; Cancer in her maternal grandmother and paternal uncle; Cancer (age of onset: 74) in her father; Diabetes (age of onset: 14) in her brother; Epilepsy (age of onset: 49) in her father; Hyperlipidemia in  her father; Hypertension (age of onset: 30) in her father; Other in her paternal grandmother; Skin cancer in her daughter; Stroke in her paternal grandfather; Uterine cancer in her mother.   ROS:  Please see the history of present illness.   Otherwise, review of systems is positive for none.   All other systems are reviewed and negative.   PHYSICAL EXAM: VS:  BP 124/68   Pulse 80   Ht 5\' 9"  (1.753 m)   Wt 242 lb (109.8 kg)   BMI 35.74 kg/m  , BMI Body mass index is 35.74 kg/m. GEN: Well nourished, well developed, in no acute distress  HEENT: normal  Neck: no JVD, carotid bruits, or masses Cardiac: RRR; no murmurs, rubs, or gallops,no edema  Respiratory:  clear to auscultation bilaterally, normal work of breathing GI: soft, nontender, nondistended, + BS MS: no deformity or atrophy  Skin: warm and dry Neuro:  Strength and sensation are intact Psych: euthymic mood, full affect  EKG:  EKG is ordered today. Personal review of the ekg ordered shows this rhythm, rate 80  Recent Labs: 03/06/2019: ALT 21 03/13/2019: BUN 12; Creatinine, Ser 0.77; Hemoglobin 14.4; Magnesium 2.0; Platelets 203; Potassium 4.9; Sodium 137    Lipid Panel     Component Value Date/Time   CHOL 180 03/20/2018 1117   TRIG 53.0 09/07/2015 1211   HDL 45.20 03/20/2018 1117   CHOLHDL 3  09/07/2015 1211   VLDL 10.6 09/07/2015 1211   LDLCALC 87 09/07/2015 1211     Wt Readings from Last 3 Encounters:  05/27/19 242 lb (109.8 kg)  03/13/19 245 lb 9.5 oz (111.4 kg)  03/06/19 242 lb 3.2 oz (109.9 kg)      Other studies Reviewed: Additional studies/ records that were reviewed today include: TTE 08/25/16  Review of the above records today demonstrates:  - Left ventricle: The cavity size was normal. Wall thickness was   increased in a pattern of mild LVH. Doppler parameters are   consistent with abnormal left ventricular relaxation (grade 1   diastolic dysfunction). - Left atrium:  The atrium was normal in size.   Myoview 08/25/17  Nuclear stress EF: 66%.  There was no ST segment deviation noted during stress.  The study is normal.  This is a low risk study.  The left ventricular ejection fraction is hyperdynamic (>65%).   Breast attenuation no ischemia or infarct EF 66%  ASSESSMENT AND PLAN:  1.  Paroxysmal atrial fibrillation/typical atrial flutter: Currently on Eliquis, flecainide, diltiazem.  CHA2DS2-VASc of 3.  She was initially planned for AF ablation but was noted to have a massive hiatal hernia and has since had surgery.  She has not had any further episodes of atrial fibrillation and would prefer to hold off on ablation at this time.  It is certainly possible that her hiatal hernia was contributing to her atrial fibrillation.  Now that she has had an operation, [name] decrease flecainide to 100 mg.   Current medicines are reviewed at length with the patient today.   The patient does not have concerns regarding her medicines.  The following changes were made today: Decrease flecainide  Labs/ tests ordered today include:  Orders Placed This Encounter  Procedures  . EKG 12-Lead     Disposition: 6 months  Signed, [place] Giammona Meredith Leeds, MD  05/27/2019 4:33 PM     [place] [address] [phone]  (office) [phone] (fax)

## 2019-06-02 DIAGNOSIS — Z85820 Personal history of malignant melanoma of skin: Secondary | ICD-10-CM | POA: Diagnosis not present

## 2019-06-02 DIAGNOSIS — Z800 Family history of malignant neoplasm of digestive organs: Secondary | ICD-10-CM | POA: Diagnosis not present

## 2019-06-02 DIAGNOSIS — Z86018 Personal history of other benign neoplasm: Secondary | ICD-10-CM | POA: Diagnosis not present

## 2019-06-02 DIAGNOSIS — Z8542 Personal history of malignant neoplasm of other parts of uterus: Secondary | ICD-10-CM | POA: Diagnosis not present

## 2019-06-02 DIAGNOSIS — Z8049 Family history of malignant neoplasm of other genital organs: Secondary | ICD-10-CM | POA: Diagnosis not present

## 2019-06-24 ENCOUNTER — Other Ambulatory Visit: Payer: Self-pay | Admitting: Internal Medicine

## 2019-06-24 ENCOUNTER — Encounter: Payer: Self-pay | Admitting: Licensed Clinical Social Worker

## 2019-06-24 ENCOUNTER — Ambulatory Visit: Payer: Self-pay | Admitting: Licensed Clinical Social Worker

## 2019-06-24 ENCOUNTER — Other Ambulatory Visit: Payer: Self-pay | Admitting: Family Medicine

## 2019-06-24 ENCOUNTER — Telehealth: Payer: Self-pay | Admitting: Licensed Clinical Social Worker

## 2019-06-24 DIAGNOSIS — Z8049 Family history of malignant neoplasm of other genital organs: Secondary | ICD-10-CM

## 2019-06-24 DIAGNOSIS — Z86018 Personal history of other benign neoplasm: Secondary | ICD-10-CM

## 2019-06-24 DIAGNOSIS — Z1379 Encounter for other screening for genetic and chromosomal anomalies: Secondary | ICD-10-CM | POA: Insufficient documentation

## 2019-06-24 DIAGNOSIS — Z8542 Personal history of malignant neoplasm of other parts of uterus: Secondary | ICD-10-CM

## 2019-06-24 DIAGNOSIS — Z800 Family history of malignant neoplasm of digestive organs: Secondary | ICD-10-CM

## 2019-06-24 NOTE — Progress Notes (Signed)
HPI:  Ms. Pressman was previously seen in the [facility] clinic due to a personal and family history of cancer and concerns regarding a hereditary predisposition to cancer. Please refer to our prior cancer genetics clinic note for more information regarding our discussion, assessment and recommendations, at the time. Ms. Angelo recent genetic test results were disclosed to her, as were recommendations warranted by these results. These results and recommendations are discussed in more detail below.  CANCER HISTORY:  Oncology History   No history exists.   At the age of 53, Ms. Cavanah was diagnosed with melanoma, this was removed. At the age of 34, Ms. Campi was diagnosed with uterine cancer. This was treated with hysterectomy. At the age of 58, Ms. Mcaleer had another melanoma removed. At the age of 55, she had a sebaceous adenoma removed from her right cheek.   FAMILY HISTORY:  We obtained a detailed, 4-generation family history.  Significant diagnoses are listed below: Family History  Problem Relation Age of Onset  . Uterine cancer Mother   . Diabetes Brother 85  . Hypertension Father 12  . Hyperlipidemia Father   . Cancer Father 39       colon and leukemia  . Epilepsy Father 18  . Arthritis Sister 33  . Cancer Maternal Grandmother        unk type possibly breast or skin  . Other Paternal Grandmother        influenza   . Stroke Paternal Grandfather   . Cancer Paternal Uncle        unk type  . Skin cancer Daughter        SCC on back    Ms. Beadle has 2 daughters, ages 17 and 33. Her youngest daughter has had squamous cell carcinoma removed from her back. Ms. Tirey has a brother and a sister, no cancers .  Ms. Kahre's mother was diagnosed with uterine cancer at 41 and died at 59. Patient does not have maternal aunts/uncles/first cousins. Maternal grandmother had cancer in her 23s but is unsure the type, possibly skin or breast. No information about her maternal  grandfather.  Ms. Ulibarri father is was diagnosed with colon cancer and leukemia in his 8s and died at 72 of a heart attack. Patient had 1 paternal uncle who also had cancer, unknown type. No known cancers in paternal cousins. Paternal grandmother died young of the flu, and grandfather died of a stroke.  Ms. Shilling is unaware of previous family history of genetic testing for hereditary cancer risks. Patient's maternal ancestors are of [place] and [place] descent, and paternal ancestors are of [place] and [place] descent. There is no reported Ashkenazi Jewish ancestry. There is no known consanguinity.  GENETIC TEST RESULTS: Genetic testing reported out on 06/21/2019 through the [facility] Common Hereditary Cancers Panel + Melanoma cancer panel found no pathogenic mutations.  The Common Hereditary Cancers Panel offered by [facility] includes sequencing and/or deletion duplication testing of the following 52 genes: APC*, ATM*, AXIN2, BAP1, BARD1, BMPR1A, BRCA1, BRCA2, BRIP1, CDH1, CDK4, CDKN2A (p14ARF), CDKN2A (p16INK4a), CHEK2, CTNNA1, DICER1*, EPCAM*, GREM1*, HOXB13, KIT, MEN1*, MITF*, MLH1*, MSH2*, MSH3*, MSH6*, MUTYH, NBN, NF1*, NTHL1, PALB2, PDGFRA, PMS2*, POLD1*, POLE, POT1, PTEN*, RAD50, RAD51C, RAD51D, RB1*, RNF43, SDHA*, SDHB, SDHC*, SDHD, SMAD4, SMARCA4, STK11, TP53, TSC1*, TSC2, VHL.  The test report has been scanned into EPIC and is located under the Molecular Pathology section of the Results Review tab.  A portion of the result report is included below for reference.  We discussed with Ms. Kovarik that because current genetic testing is not perfect, it is possible there may be a gene mutation in one of these genes that current testing cannot detect, but that chance is small.  We also discussed, that there could be another gene that has not yet been discovered, or that we have not yet tested, that is responsible for the cancer diagnoses in the family. It is also possible there is a hereditary cause for  the cancer in the family that Ms. Kuriakose did not inherit and therefore was not identified in her testing.  Therefore, it is important to remain in touch with cancer genetics in the future so that we can continue to offer Ms. Avakian the most up to date genetic testing.   ADDITIONAL GENETIC TESTING: We discussed with Ms. Haffey that her genetic testing was fairly extensive.  If there are genes identified to increase cancer risk that can be analyzed in the future, we would be happy to discuss and coordinate this testing at that time.    CANCER SCREENING RECOMMENDATIONS: Ms. Weiler test result is considered negative (normal).  This means that we have not identified a hereditary cause for her  personal and family history of cancer at this time. Most cancers happen by chance and this negative test suggests that her cancer may fall into this category.    While reassuring, this does not definitively rule out a hereditary predisposition to cancer. It is still possible that there could be genetic mutations that are undetectable by current technology. There could be genetic mutations in genes that have not been tested or identified to increase cancer risk.  Therefore, it is recommended she continue to follow the cancer management and screening guidelines provided by her oncology and primary healthcare provider.   An individual's cancer risk and medical management are not determined by genetic test results alone. Overall cancer risk assessment incorporates additional factors, including personal medical history, family history, and any available genetic information that may result in a personalized plan for cancer prevention and surveillance.   Given Ms. Eskew's personal and family histories, we must interpret these negative results with some caution.  Families with features suggestive of hereditary risk for cancer tend to have multiple family members with cancer, diagnoses in multiple generations and diagnoses before  the age of 11. Ms. Biehler family exhibits some of these features. Thus, this result may simply reflect our current inability to detect all mutations within these genes or there may be a different gene that has not yet been discovered or tested.   RECOMMENDATIONS FOR FAMILY MEMBERS:  Relatives in this family might be at some increased risk of developing cancer, over the general population risk, simply due to the family history of cancer.  We recommended female relatives in this family have a yearly mammogram beginning at age 68, or 21 years younger than the earliest onset of cancer, an annual clinical breast exam, and perform monthly breast self-exams. Female relatives in this family should also have a gynecological exam as recommended by their primary provider. All family members should have a colonoscopy by age 7, or as directed by their physicians.  FOLLOW-UP: Lastly, we discussed with Ms. Velador that cancer genetics is a rapidly advancing field and it is possible that new genetic tests will be appropriate for her and/or her family members in the future. We encouraged her to remain in contact with cancer genetics on an annual basis so we can update her personal and family  histories and let her know of advances in cancer genetics that may benefit this family.   Our contact number was provided. Ms. Blalock questions were answered to her satisfaction, and she knows she is welcome to call us at anytime with additional questions or concerns.   Faith Rogue, MS, [facility] Genetic Counselor [email or web address] Phone: [phone]

## 2019-06-24 NOTE — Telephone Encounter (Signed)
Revealed negative genetic testing.  We discussed that we do not know why she has had cancer or why there is cancer in the family. It could be due to a different gene that we are not testing, or something our current technology cannot pick up.  It will be important for her to keep in contact with genetics to learn if additional testing may be needed in the future.  

## 2019-06-27 ENCOUNTER — Telehealth: Payer: Self-pay | Admitting: Internal Medicine

## 2019-06-27 MED ORDER — FLECAINIDE ACETATE 100 MG PO TABS: 100.0000 mg | ORAL_TABLET | Freq: Two times a day (BID) | ORAL | 3 refills | Status: DC

## 2019-06-27 NOTE — Addendum Note (Signed)
Addended by: Willeen Cass A on: 06/27/2019 01:26 PM   Modules accepted: Orders

## 2019-06-27 NOTE — Telephone Encounter (Signed)
New Message  [facility] is calling about drug interaction with the patients medication

## 2019-06-27 NOTE — Telephone Encounter (Signed)
Per review of Pt med list- At last OV Pt's flecainide was reduced to 100 mg BID.  This was corrected on patient's med list.  Also per review Pt is taking myrbetriq QOD.  Per our pharmacist-ok to take myrbetriq but requires close monitoring for potential arrhythmias.  Will notify ordering nurse.

## 2019-07-05 NOTE — Telephone Encounter (Signed)
Pt scheduled to see [facility] on 5/5 to discuss further. She reports no further issues, just that one episode. States that she took Diltiazem PRN 60 mg, but that doesn't ever seem to work when she had taken it before for breakthrough episode. Pt aware will discuss treatment plan at that visit before increasing/changing any medication.  Will forward note to Roderic Palau for her Juluis Rainier of upcoming visit in May.

## 2019-07-17 ENCOUNTER — Ambulatory Visit (HOSPITAL_COMMUNITY)
Admission: RE | Admit: 2019-07-17 | Discharge: 2019-07-17 | Disposition: A | Discharge status: Home / Self Care | Payer: PPO | Source: Ambulatory Visit | Attending: Nurse Practitioner | Admitting: Nurse Practitioner

## 2019-07-17 ENCOUNTER — Other Ambulatory Visit: Payer: Self-pay

## 2019-07-17 VITALS — BP 134/76 | HR 72 | Ht 69.0 in | Wt 243.4 lb

## 2019-07-17 DIAGNOSIS — M419 Scoliosis, unspecified: Secondary | ICD-10-CM | POA: Insufficient documentation

## 2019-07-17 DIAGNOSIS — K219 Gastro-esophageal reflux disease without esophagitis: Secondary | ICD-10-CM | POA: Diagnosis not present

## 2019-07-17 DIAGNOSIS — Z79899 Other long term (current) drug therapy: Secondary | ICD-10-CM | POA: Diagnosis not present

## 2019-07-17 DIAGNOSIS — D6869 Other thrombophilia: Secondary | ICD-10-CM

## 2019-07-17 DIAGNOSIS — I4819 Other persistent atrial fibrillation: Secondary | ICD-10-CM

## 2019-07-17 DIAGNOSIS — Z7901 Long term (current) use of anticoagulants: Secondary | ICD-10-CM | POA: Insufficient documentation

## 2019-07-17 DIAGNOSIS — I480 Paroxysmal atrial fibrillation: Secondary | ICD-10-CM

## 2019-07-18 ENCOUNTER — Encounter (HOSPITAL_COMMUNITY): Payer: Self-pay | Admitting: Nurse Practitioner

## 2019-07-18 NOTE — Progress Notes (Signed)
Primary Care Physician: Lucretia Kern, DO Referring Physician: Dr. Eyvonne Left [name] is a 69 y.o. female with a h/oparoxysmla afib that is in the afib clinic for increase in afib burden over the last month. She  is on flecainide 100 mg bid. She was scheduled to have an ablation last year but was found to need repair of a large HH. It was cancelled as there was hope that the [facility] may be aggravating the afib. Her flecainide was reduced after surgery form 150 mg bid to 100 mg bid in fear of potential toxicity.  We discussed her options of change to another antiarrythmic, most likely tikosyn and the hospitalization needed with that drug. She has failed Multaq in the past as well. She does report that her afib comes on often at night, dry mouth in the am and getting up to urinate at night. There is no one in the home that can report on if she snores. She  has not had anyone over to spend the night or vacationed with family to report if she snores. It was explained to pt the high correlation  Between untreated sleep apnea and afib.  Today, she denies symptoms of palpitations, chest pain, shortness of breath, orthopnea, PND, lower extremity edema, dizziness, presyncope, syncope, or neurologic sequela. The patient is tolerating medications without difficulties and is otherwise without complaint today.   Past Medical History:  Diagnosis Date  . Chronic edema   . Constipation   . Difficult intubation    pt states that her neck needs to be in a neutral position,  scoliosisand herniated dics in neck  . Family history of colon cancer   . Family history of uterine cancer   . GERD (gastroesophageal reflux disease)    protonix, hx h. pylori  . Herniated disc, cervical   . History of hiatal hernia   . History of sebaceous adenoma   . History of uterine cancer 04/22/2014   sees Dr. Ronita Hipps; s/p complete hysterectomy  . History of uterine cancer   . Hx of colonic polyp   . Lumbar and sacral  osteoarthritis 12/10/2013  . Melanoma [facility])    sees Dr. Renda Rolls in dermatology right upper arm  . Obesity   . Paroxysmal atrial fibrillation (HCC)   . Peripheral vascular disease ([place])   . PONV (postoperative nausea and vomiting)   . Recurrent UTI   . S/P hip replacement   . Scoliosis   . Thyroid nodule   . Urinary incontinence   . Uterine cancer (HCC)    Stage 1  . Venous insufficiency    s/p ablation   Past Surgical History:  Procedure Laterality Date  . ABDOMINAL HYSTERECTOMY  09/10/2013  . BUNIONECTOMY  10/1976  . CHOLECYSTECTOMY N/A 03/12/2019   Procedure: XI ROBOTIC ASSISTED CHOLECYSTECTOMY;  Surgeon: Clovis Riley, MD;  Location: [facility];  Service: General;  Laterality: N/A;  . COLONOSCOPY    . FRACTURE SURGERY  09/1964   floor of orbit right side  . JOINT REPLACEMENT     bilateral hip replacements  . MELANOMA EXCISION  12/2011  . TOTAL HIP ARTHROPLASTY Left 06/17/2014   Procedure: LEFT TOTAL HIP ARTHROPLASTY ANTERIOR APPROACH;  Surgeon: Mcarthur Rossetti, MD;  Location: [place];  Service: Orthopedics;  Laterality: Left;  . TOTAL HIP ARTHROPLASTY Right 09/02/2014   Procedure: RIGHT TOTAL HIP ARTHROPLASTY ANTERIOR APPROACH;  Surgeon: Mcarthur Rossetti, MD;  Location: [place];  Service: Orthopedics;  Laterality: Right;  .  VEIN SURGERY  05/2011   venous ablation     Current Outpatient Medications  Medication Sig Dispense Refill  . acetaminophen (TYLENOL) 500 MG tablet Take 2 tablets (1,000 mg total) by mouth every 6 (six) hours as needed.  0  . apixaban (ELIQUIS) 5 MG TABS tablet Take 1 tablet (5 mg total) by mouth 2 (two) times daily. 180 tablet 1  . APPLE CIDER VINEGAR PO Take 1,200 mg by mouth 2 (two) times daily.    . Cranberry 500 MG TABS Take 1,000 mg by mouth 2 (two) times daily.    . cyclobenzaprine (FLEXERIL) 5 MG tablet Take 1 tablet (5 mg total) by mouth 3 (three) times daily as needed for muscle spasms. (Patient taking differently: Take 5 mg by mouth as  needed for muscle spasms. ) 20 tablet 0  . diltiazem (CARDIZEM) 60 MG tablet Take 60 mg daily if needed for palpitations (Patient taking differently: Take 60 mg by mouth as needed (Palpitation for afib). Take 60 mg daily if needed for palpitation) 30 tablet 9  . diltiazem (TIAZAC) 240 MG 24 hr capsule TAKE ONE CAPSULE BY MOUTH EVERY DAY 90 capsule 0  . flecainide (TAMBOCOR) 100 MG tablet Take 1 tablet (100 mg total) by mouth 2 (two) times daily. 180 tablet 3  . ibuprofen (ADVIL) 200 MG tablet Take 200 mg by mouth as needed.    . methenamine (HIPREX) 1 g tablet Take 1 g by mouth 2 (two) times daily.    . methenamine (MANDELAMINE) 1 g tablet Take 500 mg by mouth 2 (two) times daily.     Marland Kitchen MYRBETRIQ 25 MG TB24 tablet Take 25 mg by mouth every other day.    Marland Kitchen OVER THE COUNTER MEDICATION Take 20 mg by mouth daily as needed (arthritis). CBD oil    . pantoprazole (PROTONIX) 40 MG tablet TAKE 1 TABLET(40 MG) BY MOUTH DAILY. NEED TO MAKE AN APPOINTMENT 90 tablet 0  . polyethylene glycol (MIRALAX / GLYCOLAX) packet Take 17 g by mouth daily. (Patient taking differently: Take 17 g by mouth as needed for moderate constipation. ) 14 each 0  . senna-docusate (SENOKOT-S) 8.6-50 MG tablet Take 1 tablet by mouth at bedtime. (Patient taking differently: Take 1 tablet by mouth at bedtime. ) 30 tablet 0  . spironolactone (ALDACTONE) 50 MG tablet TAKE 2 TABLETS BY MOUTH EVERY MORNING AND 1 TABLET EVERY EVENING 270 tablet 3  . traMADol (ULTRAM) 50 MG tablet Take 1 tablet (50 mg total) by mouth every 6 (six) hours as needed (mild pain). (Patient taking differently: Take 50 mg by mouth as needed (mild pain). ) 10 tablet 0  . triamcinolone cream (KENALOG) 0.1 % Apply 1 application topically daily as needed (for dry skin).   1  . vitamin C (ASCORBIC ACID) 500 MG tablet Take 500 mg by mouth 2 (two) times daily.      No current facility-administered medications for this encounter.    Allergies  Allergen Reactions  .  Hydrolyzed Silk Hives and Other (See Comments)    Silk tape-blisters  . Ciprofloxacin     Reactions with antiarrythmic  . Gluten Meal Other (See Comments)    reflux    Social History   Socioeconomic History  . Marital status: Divorced    Spouse name: Not on file  . Number of children: 2  . Years of education: JD  . Highest education level: Not on file  Occupational History  . Occupation: Chief Executive Officer  Tobacco Use  .  Smoking status: Never Smoker  . Smokeless tobacco: Never Used  Substance and Sexual Activity  . Alcohol use: Yes    Alcohol/week: 0.0 standard drinks    Comment: rarely at [facility]   . Drug use: No  . Sexual activity: Not on file  Other Topics Concern  . Not on file  Social History Narrative   Work or School: retired Forensic psychologist      Home Situation: lives alone - takes care of twin grandchildren 7 months in 05/2015      Spiritual Beliefs:       Lifestyle: active, healthy diet      Social Determinants of Health   Financial Resource Strain:   . Difficulty of Paying Living Expenses:   Food Insecurity:   . Worried About Charity fundraiser in the Last Year:   . Arboriculturist in the Last Year:   Transportation Needs:   . Film/video editor (Medical):   Marland Kitchen Lack of Transportation (Non-Medical):   Physical Activity:   . Days of Exercise per Week:   . Minutes of Exercise per Session:   Stress:   . Feeling of Stress :   Social Connections:   . Frequency of Communication with Friends and Family:   . Frequency of Social Gatherings with Friends and Family:   . Attends Religious Services:   . Active Member of Clubs or Organizations:   . Attends Archivist Meetings:   Marland Kitchen Marital Status:   Intimate Partner Violence:   . Fear of Current or Ex-Partner:   . Emotionally Abused:   Marland Kitchen Physically Abused:   . Sexually Abused:     Family History  Problem Relation Age of Onset  . Uterine cancer Mother   . Diabetes Brother 22  . Hypertension Father 7  .  Hyperlipidemia Father   . Cancer Father 63       colon and leukemia  . Epilepsy Father 74  . Arthritis Sister 51  . Cancer Maternal Grandmother        unk type possibly breast or skin  . Other Paternal Grandmother        influenza   . Stroke Paternal Grandfather   . Cancer Paternal Uncle        unk type  . Skin cancer Daughter        SCC on back    ROS- All systems are reviewed and negative except as per the HPI above  Physical Exam: Vitals:   07/17/19 1559  BP: 134/76  Pulse: 72  Weight: 110.4 kg  Height: 5\' 9"  (1.753 m)   Wt Readings from Last 3 Encounters:  07/17/19 110.4 kg  05/27/19 109.8 kg  03/13/19 111.4 kg    Labs: Lab Results  Component Value Date   NA 137 03/13/2019   K 4.9 03/13/2019   CL 103 03/13/2019   CO2 23 03/13/2019   GLUCOSE 145 (H) 03/13/2019   BUN 12 03/13/2019   CREATININE 0.77 03/13/2019   CALCIUM 9.1 03/13/2019   MG 2.0 03/13/2019   No results found for: INR Lab Results  Component Value Date   CHOL 180 03/20/2018   HDL 45.20 03/20/2018   LDLCALC 87 09/07/2015   TRIG 53.0 09/07/2015     GEN- The patient is well appearing, alert and oriented x 3 today.   Head- normocephalic, atraumatic Eyes-  Sclera clear, conjunctiva pink Ears- hearing intact Oropharynx- clear Neck- supple, no JVP Lymph- no cervical lymphadenopathy Lungs- Clear to ausculation  bilaterally, normal work of breathing Heart- Regular rate and rhythm, no murmurs, rubs or gallops, PMI not laterally displaced GI- soft, NT, ND, + BS Extremities- no clubbing, cyanosis, or edema MS- no significant deformity or atrophy Skin- no rash or lesion Psych- euthymic mood, full affect Neuro- strength and sensation are intact  EKG-normal SR at 72 bpm, normal EKG Epic records reviewed     Assessment and Plan: 1. Paroxysmal afib Pt would prefer to get back on ablation list vrs consider tikosyn admit  I will inform Dr. Macky Lower nurse to do this  In the interim she will  continue flecainide 100 mg bid and use her prn Cardizem for breakthrough afib  I will also refer her for a sleep study as she has frequent onset of afib at night, awakes  with dry mouth and gets up to urinate a couple of times nightly   2. CHA2DS2VASc score of 3 Continue eliquis 5 mg bid   Geroge Baseman. [place], [facility] [address] [place], [place] [address] [phone]

## 2019-07-29 ENCOUNTER — Telehealth: Payer: Self-pay | Admitting: Cardiology

## 2019-07-29 NOTE — Telephone Encounter (Signed)
   Pt would like to speak with Sherri, she said she saw Craig Staggers from [place] [facility], she said they're waiting for sleep apnea referral, also, Tommi Emery would like to put her on list for ablation.  Pt c/o medication issue:  1. Name of Medication:   flecainide (TAMBOCOR) 100 MG tablet    2. How are you currently taking this medication (dosage and times per day)? Take 1 tablet (100 mg total) by mouth 2 (two) times daily.  3. Are you having a reaction (difficulty breathing--STAT)?   4. What is your medication issue?  Also, Her Afib is on going for 50 hours now and would like to know if she can increase taking this med, she said she used to take 150 mg twice a day and that control her Afib

## 2019-07-30 ENCOUNTER — Telehealth: Payer: Self-pay | Admitting: *Deleted

## 2019-07-30 MED ORDER — METOPROLOL SUCCINATE ER 25 MG PO TB24: 25.0000 mg | ORAL_TABLET | Freq: Every day | ORAL | 3 refills | Status: DC

## 2019-07-30 NOTE — Telephone Encounter (Signed)
-----   Message from [name], [place] sent at 07/30/2019  2:47 PM EDT ----- Regarding: FW: sleeps study  ----- Message ----- From: [name], RN Sent: 07/17/2019   4:48 PM EDT To: Cv Div Sleep Studies Subject: sleeps study                                   Pt needs sleep study for afib, dry mouth, frequent nighttime urination per donna carroll Thanks [employer]

## 2019-07-30 NOTE — Telephone Encounter (Signed)
Advised to continue Flecainide at 100 mg BID, not increase it. Advised to start Toprol 25 mg once daily at bedtime, per Adline Peals, PA. Advised to contact office if symptoms do not improve after medication addition and/or symptoms occur Pt agreeable to plan.  Will follow up in several weeks to see how she is doing if we don't hear from her before then.

## 2019-08-01 NOTE — Telephone Encounter (Signed)
Yesterday was not great, but today was dreadful. She is dizzy, very unsteady on her feet, can't even walk around her house. Currently it is not as bad as it was around lunch/early afternoon. She does not know what her BP is and has no way to take it.  When asked if neighbor or friend might have one she could use to check her number pt reports "no". Pt advised to change positions slowly for remainder of evening. Advised to hold Toprol dose at HS tonight -- pt states it would take A LOT to make her take another dose......Marland Kitchen Aware I will forward this to [facility] to advise.   [facility] - fyi, I am going to see if I can perform a miracle and get her scheduled for ablation next Friday (had a cancellation) -- I will talk w/ CT tomorrow morning first thing.  I will let pt know if able to get arranged quickly)

## 2019-08-01 NOTE — Telephone Encounter (Signed)
STAT if patient feels like he/she is going to faint   1) Are you dizzy now? yes  2) Do you feel faint or have you passed out? Not now, but once she starts moving she feels woozy  3) Do you have any other symptoms? Sweating (associated with dizziness)  4) Have you checked your HR and BP (record if available)?  no  Pt wants to know if this is a normal side effect of changing medication

## 2019-08-02 ENCOUNTER — Telehealth: Payer: Self-pay | Admitting: *Deleted

## 2019-08-02 ENCOUNTER — Other Ambulatory Visit: Payer: Self-pay | Admitting: *Deleted

## 2019-08-02 DIAGNOSIS — I4891 Unspecified atrial fibrillation: Secondary | ICD-10-CM

## 2019-08-02 DIAGNOSIS — I4819 Other persistent atrial fibrillation: Secondary | ICD-10-CM

## 2019-08-02 DIAGNOSIS — Z01812 Encounter for preprocedural laboratory examination: Secondary | ICD-10-CM

## 2019-08-02 NOTE — Telephone Encounter (Signed)
Follow up ° ° °Patient is returning your call. Please call. ° ° ° °

## 2019-08-02 NOTE — Telephone Encounter (Signed)
lmtcb to discuss ablation scheduled for next week.

## 2019-08-02 NOTE — Telephone Encounter (Signed)
Pt agreeable to AFib ablation next Friday. TEE scheduled for day before.  Procedure instructions reviewed w/ pt and sent via mychart. Aware office will call to arrange post procedure follow up. Patient verbalized understanding and agreeable to plan.

## 2019-08-05 ENCOUNTER — Encounter: Payer: Self-pay | Admitting: Cardiology

## 2019-08-05 ENCOUNTER — Telehealth (INDEPENDENT_AMBULATORY_CARE_PROVIDER_SITE_OTHER): Payer: PPO | Admitting: Cardiology

## 2019-08-05 ENCOUNTER — Other Ambulatory Visit: Payer: PPO

## 2019-08-05 VITALS — HR 65

## 2019-08-05 DIAGNOSIS — I480 Paroxysmal atrial fibrillation: Secondary | ICD-10-CM | POA: Diagnosis not present

## 2019-08-05 NOTE — Progress Notes (Signed)
Electrophysiology TeleHealth Note   Due to national recommendations of social distancing due to COVID 19, an audio/video telehealth visit is felt to be most appropriate for this patient at this time.  See Epic message for the patient's consent to telehealth for [facility].   Date:  08/05/2019   ID:  [name], DOB [birth year], MRN [id number]  Location: patient's home  Provider location: [address], [place]  Evaluation Performed: Follow-up visit  PCP:  Shelly Kern, DO  Cardiologist:  Shelly Casino, MD  Electrophysiologist:  Dr [name]  Chief Complaint:  AF  History of Present Illness:    [name] is a 69 y.o. female who presents via audio/video conferencing for a telehealth visit today.  Since last being seen in our clinic, the patient reports doing very well.  Today, she denies symptoms of palpitations, chest pain, shortness of breath,  lower extremity edema, dizziness, presyncope, or syncope.  The patient is otherwise without complaint today.  The patient denies symptoms of fevers, chills, cough, or new SOB worrisome for COVID 19.  She has a history significant for paroxysmal atrial fibrillation.  She was initially scheduled for atrial fibrillation ablation of what was found to have a massive hiatal hernia.  She had an operation for her hernia and her atrial fibrillation improved.  Unfortunately, she has had more frequent episodes recently.  She is ready for ablation at this time.  Today, denies symptoms of palpitations, chest pain, shortness of breath, orthopnea, PND, lower extremity edema, claudication, dizziness, presyncope, syncope, bleeding, or neurologic sequela. The patient is tolerating medications without difficulties.  She currently feels well.  She has had more frequent episodes of atrial fibrillation over the last few weeks.  She had 4 days worth of atrial fibrillation with heart rates in the 130s at times.  She felt weak and fatigued.  Her  metoprolol dose was increased which helped with her symptoms and she converted back to sinus rhythm.  Past Medical History:  Diagnosis Date  . Chronic edema   . Constipation   . Difficult intubation    pt states that her neck needs to be in a neutral position,  scoliosisand herniated dics in neck  . Family history of colon cancer   . Family history of uterine cancer   . GERD (gastroesophageal reflux disease)    protonix, hx h. pylori  . Herniated disc, cervical   . History of hiatal hernia   . History of sebaceous adenoma   . History of uterine cancer 04/22/2014   sees Dr. Ronita [name]; s/p complete hysterectomy  . History of uterine cancer   . Hx of colonic polyp   . 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 Melanoma [facility])    sees Dr. Renda Rolls in dermatology right upper arm  . Obesity   . Paroxysmal atrial fibrillation (HCC)   . Peripheral vascular disease ([place])   . PONV (postoperative nausea and vomiting)   . Recurrent UTI   . S/P hip replacement   . Scoliosis   . Thyroid nodule   . Urinary incontinence   . Uterine cancer (HCC)    Stage 1  . Venous insufficiency    s/p ablation    Past Surgical History:  Procedure Laterality Date  . ABDOMINAL HYSTERECTOMY  09/10/2013  . BUNIONECTOMY  10/1976  . CHOLECYSTECTOMY N/A 03/12/2019   Procedure: XI ROBOTIC ASSISTED CHOLECYSTECTOMY;  Surgeon: Shelly Riley, MD;  [facility];  Service: General;  Laterality: N/A;  . COLONOSCOPY    . FRACTURE SURGERY  09/1964   floor of orbit right side  . JOINT REPLACEMENT     bilateral hip replacements  . MELANOMA EXCISION  12/2011  . TOTAL HIP ARTHROPLASTY Left 06/17/2014   Procedure: LEFT TOTAL HIP ARTHROPLASTY ANTERIOR APPROACH;  Surgeon: Shelly Rossetti, MD;  Location: [place];  Service: Orthopedics;  Laterality: Left;  . TOTAL HIP ARTHROPLASTY Right 09/02/2014   Procedure: RIGHT TOTAL HIP ARTHROPLASTY ANTERIOR APPROACH;  Surgeon: Shelly Rossetti, MD;  Location: [place];   Service: Orthopedics;  Laterality: Right;  . VEIN SURGERY  05/2011   venous ablation     Current Outpatient Medications  Medication Sig Dispense Refill  . acetaminophen (TYLENOL) 500 MG tablet Take 2 tablets (1,000 mg total) by mouth every 6 (six) hours as needed.  0  . apixaban (ELIQUIS) 5 MG TABS tablet Take 1 tablet (5 mg total) by mouth 2 (two) times daily. 180 tablet 1  . APPLE CIDER VINEGAR PO Take 1,500 mg by mouth 2 (two) times daily.     . Cranberry 500 MG TABS Take 1,000 mg by mouth 2 (two) times daily.    . cyclobenzaprine (FLEXERIL) 5 MG tablet Take 1 tablet (5 mg total) by mouth 3 (three) times daily as needed for muscle spasms. (Patient not taking: Reported on 08/05/2019) 20 tablet 0  . diltiazem (CARDIZEM) 60 MG tablet Take 60 mg daily if needed for palpitations (Patient taking differently: Take 60 mg by mouth as needed (Palpitation for afib). ) 30 tablet 9  . diltiazem (TIAZAC) 240 MG 24 hr capsule TAKE ONE CAPSULE BY MOUTH EVERY DAY (Patient taking differently: Take 240 mg by mouth daily. ) 90 capsule 0  . flecainide (TAMBOCOR) 100 MG tablet Take 1 tablet (100 mg total) by mouth 2 (two) times daily. 180 tablet 3  . methenamine (HIPREX) 1 g tablet Take 1 g by mouth 2 (two) times daily.    . methenamine (MANDELAMINE) 1 g tablet Take 1,000 mg by mouth 2 (two) times daily.     . metoprolol succinate (TOPROL-XL) 25 MG 24 hr tablet Take 1 tablet (25 mg total) by mouth daily. Take with or immediately following a meal. 30 tablet 3  . MYRBETRIQ 25 MG TB24 tablet Take 25 mg by mouth every other day.    Marland Kitchen OVER THE COUNTER MEDICATION Take 20 mg by mouth daily as needed (arthritis). CBD oil    . pantoprazole (PROTONIX) 40 MG tablet TAKE 1 TABLET(40 MG) BY MOUTH DAILY. NEED TO MAKE AN APPOINTMENT (Patient taking differently: Take 40 mg by mouth daily. ) 90 tablet 0  . polyethylene glycol (MIRALAX / GLYCOLAX) packet Take 17 g by mouth daily. (Patient not taking: Reported on 08/05/2019) 14 each 0    . senna-docusate (SENOKOT-S) 8.6-50 MG tablet Take 1 tablet by mouth at bedtime. (Patient taking differently: Take 1 tablet by mouth at bedtime. ) 30 tablet 0  . spironolactone (ALDACTONE) 50 MG tablet TAKE 2 TABLETS BY MOUTH EVERY MORNING AND 1 TABLET EVERY EVENING (Patient taking differently: Take 50-100 mg by mouth See admin instructions. Take 100 mg by mouth in the morning and 50 mg in the evening) 270 tablet 3  . traMADol (ULTRAM) 50 MG tablet Take 1 tablet (50 mg total) by mouth every 6 (six) hours as needed (mild pain). (Patient not taking: Reported on 08/05/2019) 10 tablet 0  . triamcinolone cream (KENALOG) 0.1 % Apply 1 application topically daily as  needed (for dry skin).   1  . vitamin C (ASCORBIC ACID) 500 MG tablet Take 500 mg by mouth daily.      No current facility-administered medications for this visit.    Allergies:   Hydrolyzed silk, Ciprofloxacin, and Gluten meal   Social History:  The patient  reports that she has never smoked. She has never used smokeless tobacco. She reports current alcohol use. She reports that she does not use drugs.   Family History:  The patient's  family history includes Arthritis (age of onset: 63) in her sister; Cancer in her maternal grandmother and paternal uncle; Cancer (age of onset: 66) in her father; Diabetes (age of onset: 25) in her brother; Epilepsy (age of onset: 24) in her father; Hyperlipidemia in her father; Hypertension (age of onset: 55) in her father; Other in her paternal grandmother; Skin cancer in her daughter; Stroke in her paternal grandfather; Uterine cancer in her mother.   ROS:  Please see the history of present illness.   All other systems are personally reviewed and negative.    Exam:    Vital Signs:  Pulse 65    no acute distress, no shortness of breath.  Labs/Other Tests and Data Reviewed:    Recent Labs: 03/06/2019: ALT 21 03/13/2019: BUN 12; Creatinine, Ser 0.77; Hemoglobin 14.4; Magnesium 2.0; Platelets 203;  Potassium 4.9; Sodium 137   Wt Readings from Last 3 Encounters:  07/17/19 243 lb 6.4 oz (110.4 kg)  05/27/19 242 lb (109.8 kg)  03/13/19 245 lb 9.5 oz (111.4 kg)     Other studies personally reviewed: Additional studies/ records that were reviewed today include: ECG 07/17/2019 personally reviewed Review of the above records today demonstrates:   Sinus rhythm   ASSESSMENT & PLAN:    1.  Paroxysmal atrial fibrillation: Currently on Eliquis, flecainide, diltiazem.  CHA2DS2-VASc of 3.  Had a massive hiatal hernia and has unfortunately had further episodes of atrial fibrillation.  Due to that, we Tonee Silverstein plan for ablation.  Risks and benefits were discussed which include bleeding, tamponade, heart block, stroke, damage surrounding organs.  She understands these risks and has agreed to the procedure.   COVID 19 screen The patient denies symptoms of COVID 19 at this time.  The importance of social distancing was discussed today.  Follow-up:  3 months   Current medicines are reviewed at length with the patient today.   The patient does not have concerns regarding her medicines.  The following changes were made today:  none  Labs/ tests ordered today include:  No orders of the defined types were placed in this encounter.    Patient Risk:  after full review of this patients clinical status, I feel that they are at moderate risk at this time.  Today, I have spent 10 minutes with the patient with telehealth technology discussing AF .    Signed, Valbona Slabach Meredith Leeds, MD  08/05/2019 3:17 PM     [address] [place] [address] [phone] (office) [phone] (fax)

## 2019-08-07 ENCOUNTER — Other Ambulatory Visit: Payer: Self-pay

## 2019-08-07 ENCOUNTER — Other Ambulatory Visit (HOSPITAL_COMMUNITY)
Admission: RE | Admit: 2019-08-07 | Discharge: 2019-08-07 | Disposition: A | Discharge status: Home / Self Care | Payer: PPO | Source: Ambulatory Visit | Attending: Cardiology | Admitting: Cardiology

## 2019-08-07 ENCOUNTER — Telehealth: Payer: Self-pay | Admitting: *Deleted

## 2019-08-07 ENCOUNTER — Other Ambulatory Visit: Payer: PPO | Admitting: *Deleted

## 2019-08-07 DIAGNOSIS — Z20822 Contact with and (suspected) exposure to covid-19: Secondary | ICD-10-CM | POA: Diagnosis not present

## 2019-08-07 DIAGNOSIS — I4819 Other persistent atrial fibrillation: Secondary | ICD-10-CM | POA: Diagnosis not present

## 2019-08-07 DIAGNOSIS — Z01812 Encounter for preprocedural laboratory examination: Secondary | ICD-10-CM | POA: Insufficient documentation

## 2019-08-07 LAB — BASIC METABOLIC PANEL
BUN/Creatinine Ratio: 29 — ABNORMAL HIGH (ref 12–28)
BUN: 23 mg/dL (ref 8–27)
CO2: 27 mmol/L (ref 20–29)
Calcium: 9.8 mg/dL (ref 8.7–10.3)
Chloride: 105 mmol/L (ref 96–106)
Creatinine, Ser: 0.78 mg/dL (ref 0.57–1.00)
GFR calc Af Amer: 89 mL/min/{1.73_m2} (ref >59)
GFR calc non Af Amer: 77 mL/min/{1.73_m2} (ref >59)
Glucose: 83 mg/dL (ref 65–99)
Potassium: 4.8 mmol/L (ref 3.5–5.2)
Sodium: 140 mmol/L (ref 134–144)

## 2019-08-07 LAB — SARS CORONAVIRUS 2 (TAT 6-24 HRS): SARS Coronavirus 2: NEGATIVE

## 2019-08-07 LAB — CBC
Hematocrit: 44.2 % (ref 34.0–46.6)
Hemoglobin: 15 g/dL (ref 11.1–15.9)
MCH: 34.1 pg — ABNORMAL HIGH (ref 26.6–33.0)
MCHC: 33.9 g/dL (ref 31.5–35.7)
MCV: 101 fL — ABNORMAL HIGH (ref 79–97)
Platelets: 239 10*3/uL (ref 150–450)
RBC: 4.4 x10E6/uL (ref 3.77–5.28)
RDW: 13.1 % (ref 11.7–15.4)
WBC: 7.1 10*3/uL (ref 3.4–10.8)

## 2019-08-07 NOTE — Telephone Encounter (Signed)
Pt advised we are cancelling TEE scheduled for tomorrow.  Aware she does NOT need prior to ablation, per Dr. Curt Bears. Pt agreeable to plan.

## 2019-08-08 ENCOUNTER — Ambulatory Visit (HOSPITAL_COMMUNITY): Admission: RE | Admit: 2019-08-08 | Payer: PPO | Source: Home / Self Care | Admitting: Cardiology

## 2019-08-08 ENCOUNTER — Encounter (HOSPITAL_COMMUNITY): Admission: RE | Payer: PPO | Source: Home / Self Care

## 2019-08-08 SURGERY — CARDIOVERSION
Anesthesia: General

## 2019-08-09 ENCOUNTER — Ambulatory Visit (HOSPITAL_COMMUNITY): Payer: PPO | Admitting: Certified Registered"

## 2019-08-09 ENCOUNTER — Encounter (HOSPITAL_COMMUNITY)
Admission: RE | Disposition: A | Discharge status: Home / Self Care | Payer: PPO | Source: Home / Self Care | Attending: Cardiology

## 2019-08-09 ENCOUNTER — Other Ambulatory Visit: Payer: Self-pay

## 2019-08-09 ENCOUNTER — Ambulatory Visit (HOSPITAL_COMMUNITY)
Admission: RE | Admit: 2019-08-09 | Discharge: 2019-08-09 | Disposition: A | Discharge status: Home / Self Care | Payer: PPO | Attending: Cardiology | Admitting: Cardiology

## 2019-08-09 ENCOUNTER — Encounter (HOSPITAL_COMMUNITY): Payer: Self-pay | Admitting: Cardiology

## 2019-08-09 DIAGNOSIS — I872 Venous insufficiency (chronic) (peripheral): Secondary | ICD-10-CM | POA: Diagnosis not present

## 2019-08-09 DIAGNOSIS — Z79899 Other long term (current) drug therapy: Secondary | ICD-10-CM | POA: Insufficient documentation

## 2019-08-09 DIAGNOSIS — M419 Scoliosis, unspecified: Secondary | ICD-10-CM | POA: Insufficient documentation

## 2019-08-09 DIAGNOSIS — I739 Peripheral vascular disease, unspecified: Secondary | ICD-10-CM | POA: Insufficient documentation

## 2019-08-09 DIAGNOSIS — Z6834 Body mass index (BMI) 34.0-34.9, adult: Secondary | ICD-10-CM | POA: Diagnosis not present

## 2019-08-09 DIAGNOSIS — E041 Nontoxic single thyroid nodule: Secondary | ICD-10-CM | POA: Diagnosis not present

## 2019-08-09 DIAGNOSIS — K219 Gastro-esophageal reflux disease without esophagitis: Secondary | ICD-10-CM | POA: Insufficient documentation

## 2019-08-09 DIAGNOSIS — Z7901 Long term (current) use of anticoagulants: Secondary | ICD-10-CM | POA: Diagnosis not present

## 2019-08-09 DIAGNOSIS — Z96643 Presence of artificial hip joint, bilateral: Secondary | ICD-10-CM | POA: Insufficient documentation

## 2019-08-09 DIAGNOSIS — I480 Paroxysmal atrial fibrillation: Secondary | ICD-10-CM | POA: Insufficient documentation

## 2019-08-09 DIAGNOSIS — I4891 Unspecified atrial fibrillation: Secondary | ICD-10-CM | POA: Diagnosis not present

## 2019-08-09 DIAGNOSIS — Z881 Allergy status to other antibiotic agents status: Secondary | ICD-10-CM | POA: Insufficient documentation

## 2019-08-09 DIAGNOSIS — E669 Obesity, unspecified: Secondary | ICD-10-CM | POA: Diagnosis not present

## 2019-08-09 DIAGNOSIS — R55 Syncope and collapse: Secondary | ICD-10-CM | POA: Diagnosis not present

## 2019-08-09 HISTORY — PX: ATRIAL FIBRILLATION ABLATION: EP1191

## 2019-08-09 LAB — POCT ACTIVATED CLOTTING TIME
Activated Clotting Time: 274 seconds
Activated Clotting Time: 296 seconds
Activated Clotting Time: 301 seconds

## 2019-08-09 SURGERY — ATRIAL FIBRILLATION ABLATION
Anesthesia: General

## 2019-08-09 MED ORDER — ROCURONIUM BROMIDE 10 MG/ML (PF) SYRINGE
PREFILLED_SYRINGE | INTRAVENOUS | Status: DC | PRN
  Administered 2019-08-09: 50 mg via INTRAVENOUS

## 2019-08-09 MED ORDER — DOBUTAMINE IN D5W 4-5 MG/ML-% IV SOLN
INTRAVENOUS | Status: AC
  Filled 2019-08-09: qty 250

## 2019-08-09 MED ORDER — LIDOCAINE 2% (20 MG/ML) 5 ML SYRINGE
INTRAMUSCULAR | Status: DC | PRN
  Administered 2019-08-09: 40 mg via INTRAVENOUS

## 2019-08-09 MED ORDER — SCOPOLAMINE 1 MG/3DAYS TD PT72
MEDICATED_PATCH | TRANSDERMAL | Status: DC | PRN
  Administered 2019-08-09: 1.5 mg via TRANSDERMAL

## 2019-08-09 MED ORDER — SUGAMMADEX SODIUM 200 MG/2ML IV SOLN
INTRAVENOUS | Status: DC | PRN
  Administered 2019-08-09: 200 mg via INTRAVENOUS

## 2019-08-09 MED ORDER — SODIUM CHLORIDE 0.9 % IV SOLN: INTRAVENOUS | Status: DC | PRN

## 2019-08-09 MED ORDER — PROTAMINE SULFATE 10 MG/ML IV SOLN
INTRAVENOUS | Status: DC | PRN
  Administered 2019-08-09: 5 mg via INTRAVENOUS
  Administered 2019-08-09: 20 mg via INTRAVENOUS
  Administered 2019-08-09: 25 mg via INTRAVENOUS

## 2019-08-09 MED ORDER — ONDANSETRON HCL 4 MG/2ML IJ SOLN
INTRAMUSCULAR | Status: DC | PRN
  Administered 2019-08-09: 4 mg via INTRAVENOUS

## 2019-08-09 MED ORDER — FENTANYL CITRATE (PF) 100 MCG/2ML IJ SOLN
INTRAMUSCULAR | Status: DC | PRN
  Administered 2019-08-09: 25 ug via INTRAVENOUS
  Administered 2019-08-09: 50 ug via INTRAVENOUS
  Administered 2019-08-09: 25 ug via INTRAVENOUS

## 2019-08-09 MED ORDER — HEPARIN (PORCINE) IN NACL 1000-0.9 UT/500ML-% IV SOLN
INTRAVENOUS | Status: AC
  Filled 2019-08-09: qty 500

## 2019-08-09 MED ORDER — ACETAMINOPHEN 325 MG PO TABS
650.0000 mg | ORAL_TABLET | ORAL | Status: DC | PRN
  Filled 2019-08-09: qty 2

## 2019-08-09 MED ORDER — MIDAZOLAM HCL 5 MG/5ML IJ SOLN
INTRAMUSCULAR | Status: DC | PRN
  Administered 2019-08-09 (×2): 1 mg via INTRAVENOUS

## 2019-08-09 MED ORDER — HEPARIN (PORCINE) IN NACL 1000-0.9 UT/500ML-% IV SOLN
INTRAVENOUS | Status: DC | PRN
  Administered 2019-08-09 (×5): 500 mL

## 2019-08-09 MED ORDER — PHENYLEPHRINE 40 MCG/ML (10ML) SYRINGE FOR IV PUSH (FOR BLOOD PRESSURE SUPPORT)
PREFILLED_SYRINGE | INTRAVENOUS | Status: DC | PRN
  Administered 2019-08-09 (×2): 40 ug via INTRAVENOUS

## 2019-08-09 MED ORDER — DEXMEDETOMIDINE HCL 200 MCG/2ML IV SOLN
INTRAVENOUS | Status: DC | PRN
  Administered 2019-08-09: 12 ug via INTRAVENOUS
  Administered 2019-08-09: 8 ug via INTRAVENOUS

## 2019-08-09 MED ORDER — DOBUTAMINE IN D5W 4-5 MG/ML-% IV SOLN
INTRAVENOUS | Status: DC | PRN
  Administered 2019-08-09: 20 ug/kg/min via INTRAVENOUS

## 2019-08-09 MED ORDER — DEXAMETHASONE SODIUM PHOSPHATE 10 MG/ML IJ SOLN
INTRAMUSCULAR | Status: DC | PRN
  Administered 2019-08-09: 8 mg via INTRAVENOUS

## 2019-08-09 MED ORDER — ONDANSETRON HCL 4 MG/2ML IJ SOLN: 4.0000 mg | Freq: Four times a day (QID) | INTRAMUSCULAR | Status: DC | PRN

## 2019-08-09 MED ORDER — PROPOFOL 10 MG/ML IV BOLUS
INTRAVENOUS | Status: DC | PRN
  Administered 2019-08-09: 120 mg via INTRAVENOUS

## 2019-08-09 MED ORDER — SODIUM CHLORIDE 0.9% FLUSH: 3.0000 mL | Freq: Two times a day (BID) | INTRAVENOUS | Status: DC

## 2019-08-09 MED ORDER — HEPARIN SODIUM (PORCINE) 1000 UNIT/ML IJ SOLN
INTRAMUSCULAR | Status: DC | PRN
Start: 2019-08-09 — End: 2019-08-09
  Administered 2019-08-09: 4000 [IU] via INTRAVENOUS
  Administered 2019-08-09: 3000 [IU] via INTRAVENOUS
  Administered 2019-08-09: 4000 [IU] via INTRAVENOUS
  Administered 2019-08-09: 14000 [IU] via INTRAVENOUS

## 2019-08-09 MED ORDER — SODIUM CHLORIDE 0.9% FLUSH: 3.0000 mL | INTRAVENOUS | Status: DC | PRN

## 2019-08-09 MED ORDER — HEPARIN SODIUM (PORCINE) 1000 UNIT/ML IJ SOLN
INTRAMUSCULAR | Status: AC
  Filled 2019-08-09: qty 1

## 2019-08-09 MED ORDER — DIPHENHYDRAMINE HCL 50 MG/ML IJ SOLN
INTRAMUSCULAR | Status: DC | PRN
Start: 2019-08-09 — End: 2019-08-09
  Administered 2019-08-09: 12.5 mg via INTRAVENOUS

## 2019-08-09 MED ORDER — SODIUM CHLORIDE 0.9 % IV SOLN: INTRAVENOUS | Status: DC

## 2019-08-09 MED ORDER — SODIUM CHLORIDE 0.9 % IV SOLN: 250.0000 mL | INTRAVENOUS | Status: DC | PRN

## 2019-08-09 MED ORDER — PHENYLEPHRINE HCL-NACL 10-0.9 MG/250ML-% IV SOLN
INTRAVENOUS | Status: DC | PRN
  Administered 2019-08-09: 60 ug/min via INTRAVENOUS

## 2019-08-09 MED ORDER — HEPARIN SODIUM (PORCINE) 1000 UNIT/ML IJ SOLN
INTRAMUSCULAR | Status: DC | PRN
  Administered 2019-08-09: 1000 [IU] via INTRAVENOUS

## 2019-08-09 SURGICAL SUPPLY — 22 items
BLANKET WARM UNDERBOD FULL ACC (MISCELLANEOUS) ×3 IMPLANT
CATH 8FR REPROCESSED SOUNDSTAR (CATHETERS) ×3 IMPLANT
CATH MAPPNG PENTARAY F 2-6-2MM (CATHETERS) ×1 IMPLANT
CATH SMTCH THERMOCOOL SF DF (CATHETERS) ×3 IMPLANT
CATH WEBSTER BI DIR CS D-F CRV (CATHETERS) ×3 IMPLANT
COVER SWIFTLINK CONNECTOR (BAG) ×3 IMPLANT
DEVICE CLOSURE PERCLS PRGLD 6F (VASCULAR PRODUCTS) ×4 IMPLANT
PACK EP LATEX FREE (CUSTOM PROCEDURE TRAY) ×2
PACK EP LF (CUSTOM PROCEDURE TRAY) ×1 IMPLANT
PAD PRO RADIOLUCENT 2001M-C (PAD) ×3 IMPLANT
PATCH CARTO3 (PAD) ×3 IMPLANT
PENTARAY F 2-6-2MM (CATHETERS) ×3
PERCLOSE PROGLIDE 6F (VASCULAR PRODUCTS) ×12
SHEATH BAYLIS SUREFLEX  M 8.5 (SHEATH) ×2
SHEATH BAYLIS SUREFLEX M 8.5 (SHEATH) ×1 IMPLANT
SHEATH BAYLIS TRANSSEPTAL 98CM (NEEDLE) ×3 IMPLANT
SHEATH CARTO VIZIGO SM CVD (SHEATH) ×3 IMPLANT
SHEATH PINNACLE 7F 10CM (SHEATH) ×3 IMPLANT
SHEATH PINNACLE 8F 10CM (SHEATH) ×6 IMPLANT
SHEATH PINNACLE 9F 10CM (SHEATH) ×3 IMPLANT
SHEATH PROBE COVER 6X72 (BAG) ×3 IMPLANT
TUBING SMART ABLATE COOLFLOW (TUBING) ×3 IMPLANT

## 2019-08-09 NOTE — Progress Notes (Signed)
Dr. Curt Bears came and spoke with patient post ablation and stated she would be ready to be discharged when bedrest complete.

## 2019-08-09 NOTE — Anesthesia Procedure Notes (Signed)
Procedure Name: Intubation Date/Time: 08/09/2019 7:41 AM Performed by: Orlie Dakin, CRNA Pre-anesthesia Checklist: Patient identified, Emergency Drugs available, Suction available and Patient being monitored Patient Re-evaluated:Patient Re-evaluated prior to induction Oxygen Delivery Method: Circle system utilized Preoxygenation: Pre-oxygenation with 100% oxygen Induction Type: IV induction Ventilation: Mask ventilation without difficulty Laryngoscope Size: Glidescope and 4 Grade View: Grade I Tube type: Oral Tube size: 7.0 mm Number of attempts: 1 Airway Equipment and Method: Video-laryngoscopy and Rigid stylet Placement Confirmation: ETT inserted through vocal cords under direct vision,  positive ETCO2 and breath sounds checked- equal and bilateral Secured at: 23 cm Tube secured with: Tape Dental Injury: Teeth and Oropharynx as per pre-operative assessment  Comments: Glidescope Go used for H/O symptomatic neck pain/discomfort with extension of neck.  Head on pillow and patient positioned head and neck for comfort prior to induction.  Minimal neck movement with DL with Glidescope Go.  4x4s bite block used.

## 2019-08-09 NOTE — Discharge Instructions (Signed)

## 2019-08-09 NOTE — Anesthesia Preprocedure Evaluation (Addendum)
Anesthesia Evaluation  Patient identified by MRN, date of birth, ID band Patient awake    Reviewed: Allergy & Precautions, NPO status , Patient's Chart, lab work & pertinent test results  History of Anesthesia Complications (+) PONV, DIFFICULT AIRWAY and history of anesthetic complications  Airway Mallampati: I  TM Distance: >3 FB Neck ROM: Full    Dental  (+) Teeth Intact, Dental Advisory Given   Pulmonary neg pulmonary ROS,    breath sounds clear to auscultation       Cardiovascular + Peripheral Vascular Disease  + dysrhythmias Atrial Fibrillation  Rhythm:Irregular Rate:Normal     Neuro/Psych negative neurological ROS  negative psych ROS   GI/Hepatic Neg liver ROS, hiatal hernia, GERD  ,  Endo/Other  negative endocrine ROS  Renal/GU negative Renal ROS     Musculoskeletal  (+) Arthritis ,   Abdominal Normal abdominal exam  (+)   Peds  Hematology   Anesthesia Other Findings   Reproductive/Obstetrics                            Anesthesia Physical Anesthesia Plan  ASA: III  Anesthesia Plan: General   Post-op Pain Management:    Induction: Intravenous  PONV Risk Score and Plan: 4 or greater and Ondansetron, Dexamethasone, Treatment may vary due to age or medical condition and Scopolamine patch - Pre-op  Airway Management Planned: Oral ETT and Video Laryngoscope Planned  Additional Equipment: None  Intra-op Plan:   Post-operative Plan: Extubation in OR  Informed Consent: I have reviewed the patients History and Physical, chart, labs and discussed the procedure including the risks, benefits and alternatives for the proposed anesthesia with the patient or authorized representative who has indicated his/her understanding and acceptance.     Dental advisory given  Plan Discussed with: CRNA  Anesthesia Plan Comments: (Echo (2018)  - Left ventricle: The cavity size was normal.  Wall thickness was  increased in a pattern of mild LVH. Doppler parameters are  consistent with abnormal left ventricular relaxation (grade 1  diastolic dysfunction). )       Anesthesia Quick Evaluation

## 2019-08-09 NOTE — Anesthesia Postprocedure Evaluation (Signed)
Anesthesia Post Note  Patient: [name]  Procedure(s) Performed: ATRIAL FIBRILLATION ABLATION (N/A )     Patient location during evaluation: PACU Anesthesia Type: General Level of consciousness: awake and alert Pain management: pain level controlled Vital Signs Assessment: post-procedure vital signs reviewed and stable Respiratory status: spontaneous breathing, nonlabored ventilation, respiratory function stable and patient connected to nasal cannula oxygen Cardiovascular status: blood pressure returned to baseline and stable Postop Assessment: no apparent nausea or vomiting Anesthetic complications: no    Last Vitals:  Vitals:   08/09/19 1059 08/09/19 1108  BP:  (!) 107/51  Pulse:  72  Resp:  19  Temp: 36.5 C   SpO2:  94%    Last Pain:  Vitals:   08/09/19 1130  TempSrc:   PainSc: 0-No pain                 Effie Berkshire

## 2019-08-09 NOTE — Transfer of Care (Signed)
Immediate Anesthesia Transfer of Care Note  Patient: BARBE THORUP  Procedure(s) Performed: ATRIAL FIBRILLATION ABLATION (N/A )  Patient Location: Cath Lab  Anesthesia Type:General  Level of Consciousness: awake, alert  and patient cooperative  Airway & Oxygen Therapy: Patient Spontanous Breathing and Patient connected to face mask oxygen  Post-op Assessment: Report given to RN, Post -op Vital signs reviewed and stable and Patient moving all extremities X 4  Post vital signs: Reviewed and stable  Last Vitals:  Vitals Value Taken Time  BP 123/55 08/09/19 1031  Temp    Pulse 74 08/09/19 1033  Resp 25 08/09/19 1033  SpO2 86 % 08/09/19 1033  Vitals shown include unvalidated device data.  Last Pain:  Vitals:   08/09/19 0606  TempSrc: Skin  PainSc:       Patients Stated Pain Goal: 3 ([id number])  Complications: No apparent anesthesia complications

## 2019-08-09 NOTE — H&P (Signed)
[name] has presented today for surgery, with the diagnosis of atrial fibrillation.  The various methods of treatment have been discussed with the patient and family. After consideration of risks, benefits and other options for treatment, the patient has consented to  Procedure(s): Catheter ablation as a surgical intervention .  Risks include but not limited to bleeding, tamponade, heart block, stroke, damage to surrounding organs, among others. The patient's history has been reviewed, patient examined, no change in status, stable for surgery.  I have reviewed the patient's chart and labs.  Questions were answered to the patient's satisfaction.    Will Curt Bears, MD 08/09/2019 7:10 AM

## 2019-08-22 ENCOUNTER — Telehealth: Payer: Self-pay | Admitting: *Deleted

## 2019-08-22 NOTE — Telephone Encounter (Signed)
-----   Message from [name], RN sent at 07/17/2019  4:48 PM EDT ----- Regarding: sleeps study Pt needs sleep study for afib, dry mouth, frequent nighttime urination per donna carroll Thanks [employer]

## 2019-08-22 NOTE — Telephone Encounter (Signed)
Staff message sent to Gae Bon ok to schedule sleep study. HTA Auth # [id number]. Valid dates 08/22/19 to 11/20/19.

## 2019-08-23 ENCOUNTER — Other Ambulatory Visit: Payer: Self-pay | Admitting: Cardiology

## 2019-08-23 DIAGNOSIS — I4891 Unspecified atrial fibrillation: Secondary | ICD-10-CM

## 2019-08-23 NOTE — Telephone Encounter (Signed)
Prescription refill request for Eliquis received. Indication: A Fib Last office visit: 07/17/19 Scr: 0.78 Age: 69 Weight: 240

## 2019-08-29 NOTE — Telephone Encounter (Addendum)
RE: sleeps study Lauralee Evener, CMA  [name], [place] to schedule sleep study. HTA auth received. Auth # [id number] valid dates 08/22/19 to 11/20/19.    Patient is scheduled for lab study on 09/18/19. pt is scheduled for COVID screening on 09/17/19 prior to ss.  Patient understands her sleep study will be done at [facility] sleep lab. Patient understands she will receive a sleep packet in a week or so. Patient understands to call if she does not receive the sleep packet in a timely manner. Patient agrees with treatment and thanked me for call.

## 2019-08-30 ENCOUNTER — Telehealth: Payer: Self-pay | Admitting: *Deleted

## 2019-08-30 NOTE — Telephone Encounter (Signed)
Clearance faxed to requesting office. I will remove from the pre op call back pool.

## 2019-08-30 NOTE — Telephone Encounter (Signed)
   [facility] Pre-operative Risk Assessment    HEARTCARE STAFF: - Please ensure there is not already an duplicate clearance open for this procedure. - Under Visit Info/Reason for Call, type in Other and utilize the format Clearance MM/DD/YY or Clearance TBD. Do not use dashes or single digits. - If request is for dental extraction, please clarify the # of teeth to be extracted.  Request for surgical clearance:  1. What type of surgery is being performed? DENTAL CLEANING; NO EXTRACTIONS TO BE DONE   2. When is this surgery scheduled? 09/03/19   3. What type of clearance is required (medical clearance vs. Pharmacy clearance to hold med vs. Both)? PHARM  4. Are there any medications that need to be held prior to surgery and how long? PT IS IN [facility] STATES PT DOES NOT NEED TO HOLD ELIQUIS FOR DENTAL CLEANING    5. Practice name and name of physician performing surgery? [facility], DDS PA; MD NOT LISTED    6. What is the office phone number? [phone]   7.   What is the office fax number? [phone]  8.   Anesthesia type (None, local, MAC, general) ? NONE    Julaine Hua 08/30/2019, 10:18 AM  _________________________________________________________________   (provider comments below)

## 2019-08-30 NOTE — Telephone Encounter (Signed)
   Primary Cardiologist: Pixie Casino, MD  Chart reviewed as part of pre-operative protocol coverage.   Dental clearnings are considered low risk procedures per guidelines and generally do not require any specific cardiac clearance. It is also generally accepted that for simple extractions and dental cleanings, there is no need to interrupt blood thinner therapy.  I will route this recommendation to the requesting party via Epic fax function and remove from pre-op pool.  Please call with questions.  Tami Lin Jereline Ticer, PA 08/30/2019, 1:33 PM

## 2019-09-06 ENCOUNTER — Other Ambulatory Visit: Payer: Self-pay

## 2019-09-06 ENCOUNTER — Encounter (HOSPITAL_COMMUNITY): Payer: Self-pay | Admitting: Nurse Practitioner

## 2019-09-06 ENCOUNTER — Ambulatory Visit (HOSPITAL_COMMUNITY)
Admission: RE | Admit: 2019-09-06 | Discharge: 2019-09-06 | Disposition: A | Discharge status: Home / Self Care | Payer: PPO | Source: Ambulatory Visit | Attending: Nurse Practitioner | Admitting: Nurse Practitioner

## 2019-09-06 VITALS — BP 128/80 | HR 69 | Ht 69.5 in | Wt 243.8 lb

## 2019-09-06 DIAGNOSIS — I739 Peripheral vascular disease, unspecified: Secondary | ICD-10-CM | POA: Insufficient documentation

## 2019-09-06 DIAGNOSIS — Z86010 Personal history of colonic polyps: Secondary | ICD-10-CM | POA: Insufficient documentation

## 2019-09-06 DIAGNOSIS — Z7901 Long term (current) use of anticoagulants: Secondary | ICD-10-CM | POA: Insufficient documentation

## 2019-09-06 DIAGNOSIS — M47816 Spondylosis without myelopathy or radiculopathy, lumbar region: Secondary | ICD-10-CM | POA: Diagnosis not present

## 2019-09-06 DIAGNOSIS — Z881 Allergy status to other antibiotic agents status: Secondary | ICD-10-CM | POA: Diagnosis not present

## 2019-09-06 DIAGNOSIS — Z8719 Personal history of other diseases of the digestive system: Secondary | ICD-10-CM | POA: Insufficient documentation

## 2019-09-06 DIAGNOSIS — Z85820 Personal history of malignant melanoma of skin: Secondary | ICD-10-CM | POA: Insufficient documentation

## 2019-09-06 DIAGNOSIS — K5900 Constipation, unspecified: Secondary | ICD-10-CM | POA: Insufficient documentation

## 2019-09-06 DIAGNOSIS — I480 Paroxysmal atrial fibrillation: Secondary | ICD-10-CM | POA: Diagnosis not present

## 2019-09-06 DIAGNOSIS — M47818 Spondylosis without myelopathy or radiculopathy, sacral and sacrococcygeal region: Secondary | ICD-10-CM | POA: Diagnosis not present

## 2019-09-06 DIAGNOSIS — Z79899 Other long term (current) drug therapy: Secondary | ICD-10-CM | POA: Insufficient documentation

## 2019-09-06 DIAGNOSIS — Z8249 Family history of ischemic heart disease and other diseases of the circulatory system: Secondary | ICD-10-CM | POA: Diagnosis not present

## 2019-09-06 DIAGNOSIS — G4730 Sleep apnea, unspecified: Secondary | ICD-10-CM | POA: Diagnosis not present

## 2019-09-06 DIAGNOSIS — Z87440 Personal history of urinary (tract) infections: Secondary | ICD-10-CM | POA: Insufficient documentation

## 2019-09-06 DIAGNOSIS — K219 Gastro-esophageal reflux disease without esophagitis: Secondary | ICD-10-CM | POA: Insufficient documentation

## 2019-09-06 DIAGNOSIS — D6869 Other thrombophilia: Secondary | ICD-10-CM

## 2019-09-06 DIAGNOSIS — Z888 Allergy status to other drugs, medicaments and biological substances status: Secondary | ICD-10-CM | POA: Diagnosis not present

## 2019-09-06 NOTE — Progress Notes (Signed)
Primary Care Physician: Lucretia Kern, DO Referring Physician: Dr. Eyvonne Left [name] is a 69 y.o. female with a h/o paroxysmla afib that is in the afib clinic for increase in afib burden over the last month. She  is on flecainide 100 mg bid. She was scheduled to have an ablation last year but was found to need repair of a large HH. It was cancelled as there was hope that the [facility] may be aggravating the afib. Her flecainide was reduced after surgery form 150 mg bid to 100 mg bid in fear of potential toxicity.  We discussed her options of change to another antiarrythmic, most likely tikosyn and the hospitalization needed with that drug. She has failed Multaq in the past as well. She does report that her afib comes on often at night, dry mouth in the am and getting up to urinate at night. There is no one in the home that can report on if she snores. She  has not had anyone over to spend the night or vacationed with family to report if she snores. It was explained to pt the high correlation  Between untreated sleep apnea and afib.  F/u in afib clinic s/p ablation one month ago. She  has done very well with just a few flutters , has not appreciated any afib. No groin or swallowing issues. Being compliant with flecainide and eliquis with a CHA2DS2VASc of 3.   Today, she denies symptoms of palpitations, chest pain, shortness of breath, orthopnea, PND, lower extremity edema, dizziness, presyncope, syncope, or neurologic sequela. The patient is tolerating medications without difficulties and is otherwise without complaint today.   Past Medical History:  Diagnosis Date  . Chronic edema   . Constipation   . Difficult intubation    pt states that her neck needs to be in a neutral position,  scoliosisand herniated dics in neck  . Family history of colon cancer   . Family history of uterine cancer   . GERD (gastroesophageal reflux disease)    protonix, hx h. pylori  . Herniated disc, cervical   .  History of hiatal hernia   . History of sebaceous adenoma   . History of uterine cancer 04/22/2014   sees Dr. Ronita Hipps; s/p complete hysterectomy  . History of uterine cancer   . Hx of colonic polyp   . 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 Melanoma [facility])    sees Dr. Renda Rolls in dermatology right upper arm  . Obesity   . Paroxysmal atrial fibrillation (HCC)   . Peripheral vascular disease ([place])   . PONV (postoperative nausea and vomiting)   . Recurrent UTI   . S/P hip replacement   . Scoliosis   . Thyroid nodule   . Urinary incontinence   . Uterine cancer (HCC)    Stage 1  . Venous insufficiency    s/p ablation   Past Surgical History:  Procedure Laterality Date  . ABDOMINAL HYSTERECTOMY  09/10/2013  . ATRIAL FIBRILLATION ABLATION N/A 08/09/2019   Procedure: ATRIAL FIBRILLATION ABLATION;  Surgeon: Constance Haw, MD;  Location: [place] [facility];  Service: Cardiovascular;  Laterality: N/A;  . BUNIONECTOMY  10/1976  . CHOLECYSTECTOMY N/A 03/12/2019   Procedure: XI ROBOTIC ASSISTED CHOLECYSTECTOMY;  Surgeon: Clovis Riley, MD;  Location: [facility];  Service: General;  Laterality: N/A;  . COLONOSCOPY    . FRACTURE SURGERY  09/1964   floor of orbit right side  . JOINT REPLACEMENT  bilateral hip replacements  . MELANOMA EXCISION  12/2011  . TOTAL HIP ARTHROPLASTY Left 06/17/2014   Procedure: LEFT TOTAL HIP ARTHROPLASTY ANTERIOR APPROACH;  Surgeon: Mcarthur Rossetti, MD;  Location: [place];  Service: Orthopedics;  Laterality: Left;  . TOTAL HIP ARTHROPLASTY Right 09/02/2014   Procedure: RIGHT TOTAL HIP ARTHROPLASTY ANTERIOR APPROACH;  Surgeon: Mcarthur Rossetti, MD;  Location: [place];  Service: Orthopedics;  Laterality: Right;  . VEIN SURGERY  05/2011   venous ablation     Current Outpatient Medications  Medication Sig Dispense Refill  . acetaminophen (TYLENOL) 500 MG tablet Take 2 tablets (1,000 mg total) by mouth every 6 (six) hours as needed.  0  . APPLE  CIDER VINEGAR PO Take 1,500 mg by mouth 2 (two) times daily.     . Cranberry 500 MG TABS Take 1,000 mg by mouth 2 (two) times daily.    . cyclobenzaprine (FLEXERIL) 5 MG tablet Take 1 tablet (5 mg total) by mouth 3 (three) times daily as needed for muscle spasms. 20 tablet 0  . diltiazem (CARDIZEM) 60 MG tablet Take 60 mg daily if needed for palpitations (Patient taking differently: Take 60 mg by mouth as needed (Palpitation for afib). ) 30 tablet 9  . diltiazem (TIAZAC) 240 MG 24 hr capsule TAKE ONE CAPSULE BY MOUTH EVERY DAY (Patient taking differently: Take 240 mg by mouth daily. ) 90 capsule 0  . ELIQUIS 5 MG TABS tablet TAKE 1 TABLET(5 MG) BY MOUTH TWICE DAILY 180 tablet 1  . flecainide (TAMBOCOR) 100 MG tablet Take 1 tablet (100 mg total) by mouth 2 (two) times daily. 180 tablet 3  . methenamine (HIPREX) 1 g tablet Take 1 g by mouth 2 (two) times daily.    Marland Kitchen MYRBETRIQ 25 MG TB24 tablet Take 25 mg by mouth every other day.    Marland Kitchen OVER THE COUNTER MEDICATION Take 20 mg by mouth daily as needed (arthritis). CBD oil    . pantoprazole (PROTONIX) 40 MG tablet TAKE 1 TABLET(40 MG) BY MOUTH DAILY. NEED TO MAKE AN APPOINTMENT (Patient taking differently: Take 40 mg by mouth daily. ) 90 tablet 0  . polyethylene glycol (MIRALAX / GLYCOLAX) packet Take 17 g by mouth daily. (Patient taking differently: Take 17 g by mouth as needed. ) 14 each 0  . senna-docusate (SENOKOT-S) 8.6-50 MG tablet Take 1 tablet by mouth at bedtime. (Patient taking differently: Take 1 tablet by mouth at bedtime. ) 30 tablet 0  . spironolactone (ALDACTONE) 50 MG tablet TAKE 2 TABLETS BY MOUTH EVERY MORNING AND 1 TABLET EVERY EVENING (Patient taking differently: Take 50-100 mg by mouth See admin instructions. Take 100 mg by mouth in the morning and 50 mg in the evening) 270 tablet 3  . triamcinolone cream (KENALOG) 0.1 % Apply 1 application topically daily as needed (for dry skin).   1  . vitamin C (ASCORBIC ACID) 500 MG tablet Take 500  mg by mouth daily.     . traMADol (ULTRAM) 50 MG tablet Take 1 tablet (50 mg total) by mouth every 6 (six) hours as needed (mild pain). (Patient not taking: Reported on 08/05/2019) 10 tablet 0   No current facility-administered medications for this encounter.    Allergies  Allergen Reactions  . Hydrolyzed Silk Hives and Other (See Comments)    Silk tape-blisters  . Ciprofloxacin     Reactions with antiarrythmic  . Gluten Meal Other (See Comments)    reflux  . Metoprolol  dizzy    Social History   Socioeconomic History  . Marital status: Divorced    Spouse name: Not on file  . Number of children: 2  . Years of education: JD  . Highest education level: Not on file  Occupational History  . Occupation: Chief Executive Officer  Tobacco Use  . Smoking status: Never Smoker  . Smokeless tobacco: Never Used  Vaping Use  . Vaping Use: Never used  Substance and Sexual Activity  . Alcohol use: Yes    Alcohol/week: 0.0 standard drinks    Comment: rarely at [facility]   . Drug use: No  . Sexual activity: Not on file  Other Topics Concern  . Not on file  Social History Narrative   Work or School: retired Forensic psychologist      Home Situation: lives alone - takes care of twin grandchildren 7 months in 05/2015      Spiritual Beliefs:       Lifestyle: active, healthy diet      Social Determinants of Health   Financial Resource Strain:   . Difficulty of Paying Living Expenses:   Food Insecurity:   . Worried About Charity fundraiser in the Last Year:   . Arboriculturist in the Last Year:   Transportation Needs:   . Film/video editor (Medical):   Marland Kitchen Lack of Transportation (Non-Medical):   Physical Activity:   . Days of Exercise per Week:   . Minutes of Exercise per Session:   Stress:   . Feeling of Stress :   Social Connections:   . Frequency of Communication with Friends and Family:   . Frequency of Social Gatherings with Friends and Family:   . Attends Religious Services:   . Active  Member of Clubs or Organizations:   . Attends Archivist Meetings:   Marland Kitchen Marital Status:   Intimate Partner Violence:   . Fear of Current or Ex-Partner:   . Emotionally Abused:   Marland Kitchen Physically Abused:   . Sexually Abused:     Family History  Problem Relation Age of Onset  . Uterine cancer Mother   . Diabetes Brother 11  . Hypertension Father 21  . Hyperlipidemia Father   . Cancer Father 77       colon and leukemia  . Epilepsy Father 25  . Arthritis Sister 58  . Cancer Maternal Grandmother        unk type possibly breast or skin  . Other Paternal Grandmother        influenza   . Stroke Paternal Grandfather   . Cancer Paternal Uncle        unk type  . Skin cancer Daughter        SCC on back    ROS- All systems are reviewed and negative except as per the HPI above  Physical Exam: Vitals:   09/06/19 0833  BP: 128/80  Pulse: 69  Weight: 110.6 kg  Height: 5' 9.5" (1.765 m)   Wt Readings from Last 3 Encounters:  09/06/19 110.6 kg  08/09/19 108.9 kg  07/17/19 110.4 kg    Labs: Lab Results  Component Value Date   NA 140 08/07/2019   K 4.8 08/07/2019   CL 105 08/07/2019   CO2 27 08/07/2019   GLUCOSE 83 08/07/2019   BUN 23 08/07/2019   CREATININE 0.78 08/07/2019   CALCIUM 9.8 08/07/2019   MG 2.0 03/13/2019   No results found for: INR Lab Results  Component Value Date  CHOL 180 03/20/2018   HDL 45.20 03/20/2018   LDLCALC 87 09/07/2015   TRIG 53.0 09/07/2015     GEN- The patient is well appearing, alert and oriented x 3 today.   Head- normocephalic, atraumatic Eyes-  Sclera clear, conjunctiva pink Ears- hearing intact Oropharynx- clear Neck- supple, no JVP Lymph- no cervical lymphadenopathy Lungs- Clear to ausculation bilaterally, normal work of breathing Heart- Regular rate and rhythm, no murmurs, rubs or gallops, PMI not laterally displaced GI- soft, NT, ND, + BS Extremities- no clubbing, cyanosis, or edema MS- no significant deformity or  atrophy Skin- no rash or lesion Psych- euthymic mood, full affect Neuro- strength and sensation are intact  EKG-normal SR at 72 bpm, normal EKG Epic records reviewed     Assessment and Plan: 1. Paroxysmal afib  s/p ablation  5/28 and is doing well staying in SR Continue flecainide 100 mg bid  I  referred her for a sleep study as she has frequent onset of afib at night, awakes  with dry mouth and gets up to urinate a couple of times nightly, pending 7/6  2. CHA2DS2VASc score of 3 Continue eliquis 5 mg bid  Reminded not to stop for any elective procedure during the 3 month healing period   F/u with Dr. Curt Bears 9/2 afib clinic as needed   Butch Penny C. [place], [facility] [address] [place], [place] [address] [phone]

## 2019-09-17 ENCOUNTER — Other Ambulatory Visit (HOSPITAL_COMMUNITY): Payer: PPO

## 2019-09-18 ENCOUNTER — Other Ambulatory Visit: Payer: Self-pay

## 2019-09-18 ENCOUNTER — Ambulatory Visit (HOSPITAL_BASED_OUTPATIENT_CLINIC_OR_DEPARTMENT_OTHER): Payer: PPO | Attending: Nurse Practitioner | Admitting: Cardiology

## 2019-09-18 VITALS — Ht 69.5 in | Wt 242.0 lb

## 2019-09-18 DIAGNOSIS — I4819 Other persistent atrial fibrillation: Secondary | ICD-10-CM

## 2019-09-18 DIAGNOSIS — G4733 Obstructive sleep apnea (adult) (pediatric): Secondary | ICD-10-CM | POA: Insufficient documentation

## 2019-09-27 ENCOUNTER — Other Ambulatory Visit: Payer: Self-pay | Admitting: Cardiology

## 2019-09-28 ENCOUNTER — Other Ambulatory Visit: Payer: Self-pay | Admitting: Family Medicine

## 2019-09-30 NOTE — Procedures (Signed)
Patient Name: [name], [name] Date: 09/18/2019 Gender: Female D.O.B: 31-May-1950 Age (years): 54 Referring Provider: Sherran Needs Height (inches): 5 Interpreting Physician: Fransico Him MD, ABSM Weight (lbs): 242 RPSGT: Laren Everts BMI: 35 MRN: [id number] Neck Size: 15.00  CLINICAL INFORMATION Sleep Study Type: Split Night CPAP  Indication for sleep study: Excessive Daytime Sleepiness, Fatigue, Morning Headaches, Obesity  Epworth Sleepiness Score: 11  SLEEP STUDY TECHNIQUE As per the AASM Manual for the Scoring of Sleep and Associated Events v2.3 (April 2016) with a hypopnea requiring 4% desaturations.  The channels recorded and monitored were frontal, central and occipital EEG, electrooculogram (EOG), submentalis EMG (chin), nasal and oral airflow, thoracic and abdominal wall motion, anterior tibialis EMG, snore microphone, electrocardiogram, and pulse oximetry. Continuous positive airway pressure (CPAP) was initiated when the patient met split night criteria and was titrated according to treat sleep-disordered breathing.  MEDICATIONS Medications self-administered by patient taken the night of the study : N/A  RESPIRATORY PARAMETERS Diagnostic Total AHI (/hr): 49.8  RDI (/hr):68.2  OA Index (/hr): 0.5  CA Index (/hr): 0.5 REM AHI (/hr): N/A  NREM AHI (/hr):49.8  Supine AHI (/hr):73.5  Non-supine AHI (/hr):26.1 Min O2 Sat (%):84.0  Mean O2 (%): 91.4  Time below 88% (min):11.7   Titration Optimal Pressure (cm):16  AHI at Optimal Pressure (/hr):4.7  Min O2 at Optimal Pressure (%):93.0 Supine % at Optimal (%):100  Sleep % at Optimal (%):98   SLEEP ARCHITECTURE The recording time for the entire night was 411.4 minutes.  During a baseline period of 149.2 minutes, the patient slept for 124.0 minutes in REM and nonREM, yielding a sleep efficiency of 83.1%. Sleep onset after lights out was 5.4 minutes with a REM latency of N/A minutes. The patient spent  6.9% of the night in stage N1 sleep, 93.1% in stage N2 sleep, 0.0% in stage N3 and 0% in REM.  During the titration period of 253.9 minutes, the patient slept for 205.0 minutes in REM and nonREM, yielding a sleep efficiency of 80.7%. Sleep onset after CPAP initiation was 15.9 minutes with a REM latency of 61.5 minutes. The patient spent 8.5% of the night in stage N1 sleep, 64.4% in stage N2 sleep, 0.0% in stage N3 and 27.1% in REM.  CARDIAC DATA The 2 lead EKG demonstrated sinus rhythm. The mean heart rate was 100.0 beats per minute. Other EKG findings include: None.  LEG MOVEMENT DATA The total Periodic Limb Movements of Sleep (PLMS) were 0. The PLMS index was 0.0 .  IMPRESSIONS - Severe obstructive sleep apnea occurred during the diagnostic portion of the study (AHI = 49.8/hour). An optimal PAP pressure was selected for this patient ( 16 cm of water) - Mild central sleep apnea occurred during the diagnostic portion of the study (CAI = 0.5/hour). - Severe oxygen desaturation was noted during the diagnostic portion of the study (Min O2 = 84.0%). - The patient snored with moderate snoring volume during the diagnostic portion of the study. - No cardiac abnormalities were noted during this study. - Clinically significant periodic limb movements did not occur during sleep.  DIAGNOSIS - Obstructive Sleep Apnea (G47.33)  RECOMMENDATIONS - Trial of CPAP therapy on 16 cm H2O with a Small size Fisher&Paykel Full Face Mask F&P Vitera (new) mask and heated humidification. - Avoid alcohol, sedatives and other CNS depressants that may worsen sleep apnea and disrupt normal sleep architecture. - Sleep hygiene should be reviewed to assess factors that may improve sleep quality. - Weight management  and regular exercise should be initiated or continued. - Return to Sleep Center for re-evaluation after 8 weeks of therapy  [Electronically signed] 09/30/2019 08:07 PM  Fransico Him MD, ABSM Diplomate,  American Board of Sleep Medicine

## 2019-10-04 ENCOUNTER — Telehealth: Payer: Self-pay | Admitting: *Deleted

## 2019-10-04 NOTE — Telephone Encounter (Signed)
Informed patient of sleep study results and patient understanding was verbalized. Patient understands her sleep study showed they have significant sleep apnea and had successful PAP titration and will be set up with PAP unit. Please let DME know that order is in EPIC. Please set patient up for OV in 8 weeks. Pt is aware and agreeable to her results.  Upon patient request DME selection is [facility]. Patient understands she will be contacted by [facility] to set up her cpap. Patient understands to call if [facility] does not contact her with new setup in a timely manner. Patient understands they will be called once confirmation has been received from [facility] that they have received their new machine to schedule 10 week follow up appointment.  [facility] notified of new cpap order  Please add to airview Patient was grateful for the call and thanked me.

## 2019-10-04 NOTE — Telephone Encounter (Signed)
-----   Message from Sueanne Margarita, MD sent at 09/30/2019  8:11 PM EDT ----- Please let patient know that they have significant sleep apnea and had successful PAP titration and will be set up with PAP unit.  Please let DME know that order is in EPIC.  Please set patient up for OV in 8 weeks

## 2019-10-07 ENCOUNTER — Encounter: Payer: Self-pay | Admitting: Nurse Practitioner

## 2019-10-08 ENCOUNTER — Other Ambulatory Visit: Payer: Self-pay

## 2019-10-22 DIAGNOSIS — G4733 Obstructive sleep apnea (adult) (pediatric): Secondary | ICD-10-CM | POA: Diagnosis not present

## 2019-11-13 DIAGNOSIS — D225 Melanocytic nevi of trunk: Secondary | ICD-10-CM | POA: Diagnosis not present

## 2019-11-13 DIAGNOSIS — L570 Actinic keratosis: Secondary | ICD-10-CM | POA: Diagnosis not present

## 2019-11-13 DIAGNOSIS — L814 Other melanin hyperpigmentation: Secondary | ICD-10-CM | POA: Diagnosis not present

## 2019-11-13 DIAGNOSIS — L820 Inflamed seborrheic keratosis: Secondary | ICD-10-CM | POA: Diagnosis not present

## 2019-11-13 DIAGNOSIS — D485 Neoplasm of uncertain behavior of skin: Secondary | ICD-10-CM | POA: Diagnosis not present

## 2019-11-13 DIAGNOSIS — L578 Other skin changes due to chronic exposure to nonionizing radiation: Secondary | ICD-10-CM | POA: Diagnosis not present

## 2019-11-13 DIAGNOSIS — L821 Other seborrheic keratosis: Secondary | ICD-10-CM | POA: Diagnosis not present

## 2019-11-13 DIAGNOSIS — Z85820 Personal history of malignant melanoma of skin: Secondary | ICD-10-CM | POA: Diagnosis not present

## 2019-11-14 ENCOUNTER — Other Ambulatory Visit: Payer: Self-pay

## 2019-11-14 ENCOUNTER — Encounter: Payer: Self-pay | Admitting: Cardiology

## 2019-11-14 ENCOUNTER — Ambulatory Visit: Payer: PPO | Admitting: Cardiology

## 2019-11-14 VITALS — BP 112/72 | HR 70 | Ht 69.0 in | Wt 231.0 lb

## 2019-11-14 DIAGNOSIS — I480 Paroxysmal atrial fibrillation: Secondary | ICD-10-CM

## 2019-11-14 MED ORDER — FLECAINIDE ACETATE 50 MG PO TABS
50.0000 mg | ORAL_TABLET | Freq: Two times a day (BID) | ORAL | 2 refills | Status: DC
Start: 2019-11-14 — End: 2020-02-25

## 2019-11-14 NOTE — Progress Notes (Signed)
Electrophysiology Office Note   Date:  11/14/2019   ID:  [name], DOB 07-29-1950, MRN [id number]  PCP:  Lucretia Kern, DO  Cardiologist: [facility] Primary Electrophysiologist:  Dr Curt Bears    CC: Evaluation for atrial fibrillation   History of Present Illness: [name] is a 69 y.o. female who is being seen today for the evaluation of atrial fibrillation at the request of Lucretia Kern, DO. Presenting today for electrophysiology evaluation.  Her atrial fibrillation was initially noted in 2012 and initially controlled with diltiazem.  She was admitted in December 2019 with multifocal pneumonia and acute onset of atrial fibrillation with rapid rates.  Heart rates were as high as the 180s despite being on flecainide and Cardizem.  She spontaneously converted to sinus rhythm.  She has had multiple episodes of tachycardia at home since that time.  She was initially plan for AF ablation but was found to have a large hiatal hernia and is now status post hernia repair.  She had surgery for her hiatal hernia.  She is now status post AF ablation 08/09/2019.  Today, denies symptoms of palpitations, chest pain, shortness of breath, orthopnea, PND, lower extremity edema, claudication, dizziness, presyncope, syncope, bleeding, or neurologic sequela. The patient is tolerating medications without difficulties.  She has been doing well.  She was diagnosed with obstructive sleep apnea and is now on CPAP.  And improvement in her overall symptomatology.  She is no longer having palpitations.  She did have a few palpitations immediately after her AF ablation.  She is now feeling well and is able do all her daily activities.   Past Medical History:  Diagnosis Date  . Chronic edema   . Constipation   . Difficult intubation    pt states that her neck needs to be in a neutral position,  scoliosisand herniated dics in neck  . Family history of colon cancer   . Family history of uterine cancer   . GERD  (gastroesophageal reflux disease)    protonix, hx h. pylori  . Herniated disc, cervical   . History of hiatal hernia   . History of sebaceous adenoma   . History of uterine cancer 04/22/2014   sees Dr. Ronita Hipps; s/p complete hysterectomy  . History of uterine cancer   . Hx of colonic polyp   . 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 Melanoma [facility])    sees Dr. Renda Rolls in dermatology right upper arm  . Obesity   . Paroxysmal atrial fibrillation (HCC)   . Peripheral vascular disease ([place])   . PONV (postoperative nausea and vomiting)   . Recurrent UTI   . S/P hip replacement   . Scoliosis   . Thyroid nodule   . Urinary incontinence   . Uterine cancer (HCC)    Stage 1  . Venous insufficiency    s/p ablation   Past Surgical History:  Procedure Laterality Date  . ABDOMINAL HYSTERECTOMY  09/10/2013  . ATRIAL FIBRILLATION ABLATION N/A 08/09/2019   Procedure: ATRIAL FIBRILLATION ABLATION;  Surgeon: Constance Haw, MD;  Location: [facility];  Service: Cardiovascular;  Laterality: N/A;  . BUNIONECTOMY  10/1976  . CHOLECYSTECTOMY N/A 03/12/2019   Procedure: XI ROBOTIC ASSISTED CHOLECYSTECTOMY;  Surgeon: Clovis Riley, MD;  Location: [facility];  Service: General;  Laterality: N/A;  . COLONOSCOPY    . FRACTURE SURGERY  09/1964   floor of orbit right side  . JOINT REPLACEMENT     bilateral hip  replacements  . MELANOMA EXCISION  12/2011  . TOTAL HIP ARTHROPLASTY Left 06/17/2014   Procedure: LEFT TOTAL HIP ARTHROPLASTY ANTERIOR APPROACH;  Surgeon: Mcarthur Rossetti, MD;  Location: [facility];  Service: Orthopedics;  Laterality: Left;  . TOTAL HIP ARTHROPLASTY Right 09/02/2014   Procedure: RIGHT TOTAL HIP ARTHROPLASTY ANTERIOR APPROACH;  Surgeon: Mcarthur Rossetti, MD;  Location: [place];  Service: Orthopedics;  Laterality: Right;  . VEIN SURGERY  05/2011   venous ablation      Current Outpatient Medications  Medication Sig Dispense Refill  . acetaminophen (TYLENOL) 500  MG tablet Take 2 tablets (1,000 mg total) by mouth every 6 (six) hours as needed.  0  . APPLE CIDER VINEGAR PO Take 1,500 mg by mouth 2 (two) times daily.     . Cranberry 500 MG TABS Take 1,000 mg by mouth 2 (two) times daily.    . cyclobenzaprine (FLEXERIL) 5 MG tablet Take 1 tablet (5 mg total) by mouth 3 (three) times daily as needed for muscle spasms. 20 tablet 0  . diltiazem (CARDIZEM) 60 MG tablet Take 60 mg daily if needed for palpitations (Patient taking differently: Take 60 mg by mouth as needed (Palpitation for afib). ) 30 tablet 9  . diltiazem (TIAZAC) 240 MG 24 hr capsule TAKE 1 CAPSULE BY MOUTH EVERY DAY 90 capsule 0  . ELIQUIS 5 MG TABS tablet TAKE 1 TABLET(5 MG) BY MOUTH TWICE DAILY 180 tablet 1  . methenamine (HIPREX) 1 g tablet Take 1 g by mouth 2 (two) times daily.    Marland Kitchen MYRBETRIQ 25 MG TB24 tablet Take 25 mg by mouth every other day.    Marland Kitchen OVER THE COUNTER MEDICATION Take 20 mg by mouth daily as needed (arthritis). CBD oil    . pantoprazole (PROTONIX) 20 MG tablet Take 20 mg by mouth daily.    . polyethylene glycol (MIRALAX / GLYCOLAX) packet Take 17 g by mouth daily. (Patient taking differently: Take 17 g by mouth as needed. ) 14 each 0  . senna-docusate (SENOKOT-S) 8.6-50 MG tablet Take 1 tablet by mouth at bedtime. (Patient taking differently: Take 1 tablet by mouth at bedtime. ) 30 tablet 0  . spironolactone (ALDACTONE) 50 MG tablet TAKE 2 TABLETS BY MOUTH EVERY MORNING AND 1 TABLET EVERY EVENING (Patient taking differently: Take 50-100 mg by mouth See admin instructions. Take 100 mg by mouth in the morning and 50 mg in the evening) 270 tablet 3  . traMADol (ULTRAM) 50 MG tablet Take 1 tablet (50 mg total) by mouth every 6 (six) hours as needed (mild pain). 10 tablet 0  . triamcinolone cream (KENALOG) 0.1 % Apply 1 application topically daily as needed (for dry skin).   1  . vitamin C (ASCORBIC ACID) 500 MG tablet Take 500 mg by mouth daily.     . flecainide (TAMBOCOR) 50 MG  tablet Take 1 tablet (50 mg total) by mouth 2 (two) times daily. 180 tablet 2   No current facility-administered medications for this visit.    Allergies:   Hydrolyzed silk, Ciprofloxacin, Gluten meal, and Metoprolol   Social History:  The patient  reports that she has never smoked. She has never used smokeless tobacco. She reports current alcohol use. She reports that she does not use drugs.   Family History:  The patient's family history includes Arthritis (age of onset: 18) in her sister; Cancer in her maternal grandmother and paternal uncle; Cancer (age of onset: 87) in her father; Diabetes (age of  onset: 13) in her brother; Epilepsy (age of onset: 74) in her father; Hyperlipidemia in her father; Hypertension (age of onset: 20) in her father; Other in her paternal grandmother; Skin cancer in her daughter; Stroke in her paternal grandfather; Uterine cancer in her mother.   ROS:  Please see the history of present illness.   Otherwise, review of systems is positive for none.   All other systems are reviewed and negative.   PHYSICAL EXAM: VS:  BP 112/72   Pulse 70   Ht 5\' 9"  (1.753 m)   Wt 231 lb (104.8 kg)   SpO2 97%   BMI 34.11 kg/m  , BMI Body mass index is 34.11 kg/m. GEN: Well nourished, well developed, in no acute distress  HEENT: normal  Neck: no JVD, carotid bruits, or masses Cardiac: RRR; no murmurs, rubs, or gallops,no edema  Respiratory:  clear to auscultation bilaterally, normal work of breathing GI: soft, nontender, nondistended, + BS MS: no deformity or atrophy  Skin: warm and dry Neuro:  Strength and sensation are intact Psych: euthymic mood, full affect  EKG:  EKG is ordered today. Personal review of the ekg ordered  shows sinus rhythm, rate 62  Recent Labs: 03/06/2019: ALT 21 03/13/2019: Magnesium 2.0 08/07/2019: BUN 23; Creatinine, Ser 0.78; Hemoglobin 15.0; Platelets 239; Potassium 4.8; Sodium 140    Lipid Panel     Component Value Date/Time   CHOL 180  03/20/2018 1117   TRIG 53.0 09/07/2015 1211   HDL 45.20 03/20/2018 1117   CHOLHDL 3 09/07/2015 1211   VLDL 10.6 09/07/2015 1211   LDLCALC 87 09/07/2015 1211     Wt Readings from Last 3 Encounters:  11/14/19 231 lb (104.8 kg)  09/18/19 242 lb (109.8 kg)  09/06/19 243 lb 12.8 oz (110.6 kg)      Other studies Reviewed: Additional studies/ records that were reviewed today include: TTE 08/25/16  Review of the above records today demonstrates:  - Left ventricle: The cavity size was normal. Wall thickness was   increased in a pattern of mild LVH. Doppler parameters are   consistent with abnormal left ventricular relaxation (grade 1   diastolic dysfunction). - Left atrium:  The atrium was normal in size.   Myoview 08/25/17  Nuclear stress EF: 66%.  There was no ST segment deviation noted during stress.  The study is normal.  This is a low risk study.  The left ventricular ejection fraction is hyperdynamic (>65%).   Breast attenuation no ischemia or infarct EF 66%  ASSESSMENT AND PLAN:  1.  Paroxysmal atrial fibrillation/typical atrial flutter: Currently on Eliquis, flecainide, diltiazem.  CHA2DS2-VASc of 3.  Is status post AF ablation 08/09/2019.  She has done well without complaint.  Decrease flecainide to 50 mg today.  2.  Obstructive sleep apnea: Found to be severe.  She is on CPAP with improvement in fatigue.   Current medicines are reviewed at length with the patient today.   The patient does not have concerns regarding her medicines.  The following changes were made today: Decrease flecainide  Labs/ tests ordered today include:  Orders Placed This Encounter  Procedures  . EKG 12-Lead     Disposition: Follow-up with [facility] 3 months  Signed, Torrell Krutz Meredith Leeds, MD  11/14/2019 10:19 AM     [facility] [address] [place] [address] [phone] (office) [phone] (fax)

## 2019-11-14 NOTE — Telephone Encounter (Signed)
Patient has a 10 week follow up appointment scheduled for 12/02/19. Patient understands she needs to keep this appointment for insurance compliance. Patient was grateful for the call and thanked me.

## 2019-11-14 NOTE — Patient Instructions (Addendum)
Medication Instructions:  Your physician has recommended you make the following change in your medication:  1. DECREASE Flecainide to 50 mg twice a day  *If you need a refill on your cardiac medications before your next appointment, please call your pharmacy*   Lab Work: None ordered   Testing/Procedures: None ordered   Follow-Up: At [facility], you and your health needs are our priority.  As part of our continuing mission to provide you with exceptional heart care, we have created designated Provider Care Teams.  These Care Teams include your primary Cardiologist (physician) and Advanced Practice Providers (APPs -  Physician Assistants and Nurse Practitioners) who all work together to provide you with the care you need, when you need it.  We recommend signing up for the patient portal called "MyChart".  Sign up information is provided on this After Visit Summary.  MyChart is used to connect with patients for Virtual Visits (Telemedicine).  Patients are able to view lab/test results, encounter notes, upcoming appointments, etc.  Non-urgent messages can be sent to your provider as well.   To learn more about what you can do with MyChart, go to [email or web address].    Your next appointment:   3 month(s)  The format for your next appointment:   In Person  Provider:   Allegra Lai, MD   Thank you for choosing [facility]!!   Trinidad Curet, RN [phone]    Other Instructions

## 2019-11-21 ENCOUNTER — Encounter: Payer: Self-pay | Admitting: Nurse Practitioner

## 2019-11-21 ENCOUNTER — Ambulatory Visit: Payer: PPO | Admitting: Nurse Practitioner

## 2019-11-21 ENCOUNTER — Telehealth: Payer: Self-pay

## 2019-11-21 VITALS — BP 108/64 | HR 68 | Ht 68.5 in | Wt 230.2 lb

## 2019-11-21 DIAGNOSIS — Z86010 Personal history of colonic polyps: Secondary | ICD-10-CM | POA: Diagnosis not present

## 2019-11-21 DIAGNOSIS — I480 Paroxysmal atrial fibrillation: Secondary | ICD-10-CM | POA: Diagnosis not present

## 2019-11-21 MED ORDER — SUPREP BOWEL PREP KIT 17.5-3.13-1.6 GM/177ML PO SOLN
1.0000 | ORAL | 0 refills | Status: DC
Start: 2019-11-21 — End: 2020-01-17

## 2019-11-21 NOTE — Telephone Encounter (Signed)
Request for surgical clearance:     Endoscopy Procedure  What type of surgery is being performed?     Colonoscopy  When is this surgery scheduled?     01/17/2020  What type of clearance is required ?   Pharmacy  Are there any medications that need to be held prior to surgery and how long? Eliquis x2 days   Practice name and name of physician performing surgery?      [facility]  What is your office phone and fax number?      Phone- [phone]  [phone]  Anesthesia type (None, local, MAC, general) ?       MAC

## 2019-11-21 NOTE — Telephone Encounter (Signed)
Patient with diagnosis of ATRIAL FIBRILLATION on ELIQUIS for anticoagulation.    Procedure: ENDOSCOPY Date of procedure: 01-17-2020  CHADS2-VASc score of 3  ( HTN, AGE,  female)  CrCl > 100 ml/min Platelet count 239  Per office protocol, patient can hold ELIQUIS for 3 days prior to procedure.  Will NOT need bridging with Lovenox (enoxaparin) around procedure.

## 2019-11-21 NOTE — Patient Instructions (Addendum)
You have been scheduled for a colonoscopy. Please follow written instructions given to you at your visit today.  Please pick up your prep supplies at the pharmacy within the next 1-3 days. If you use inhalers (even only as needed), please bring them with you on the day of your procedure.   We have sent the following medications to your pharmacy for you to pick up at your convenience: [place] will be contaced by our office prior to your procedure for directions on holding your Eliquis. If you do not hear from our office 1 week prior to your scheduled procedure, please call [phone] to discuss.  If you are age 69 or older, your body mass index should be between 23-30. Your Body mass index is 34.5 kg/m. If this is out of the aforementioned range listed, please consider follow up with your Primary Care Provider.  If you are age 17 or younger, your body mass index should be between 19-25. Your Body mass index is 34.5 kg/m. If this is out of the aformentioned range listed, please consider follow up with your Primary Care Provider.    Due to recent changes in healthcare laws, you may see the results of your imaging and laboratory studies on MyChart before your provider has had a chance to review them.  We understand that in some cases there may be results that are confusing or concerning to you. Not all laboratory results come back in the same time frame and the provider may be waiting for multiple results in order to interpret others.  Please give [place] 48 hours in order for your provider to thoroughly review all the results before contacting the office for clarification of your results.   Further follow-up to be determined after completion of colonoscopy.   Thank you for choosing me and [facility].  [place]

## 2019-11-21 NOTE — Progress Notes (Signed)
11/21/2019 [name] [id number] 21-Mar-1950   CHIEF COMPLAINT:  Schedule a colonoscopy   HISTORY OF PRESENT ILLNESS:  [name] is a 68 year old female with a past medical history of atrial fibrilation on Eliquis, OSA uses Cpap, pneumonia 2019, melanoma, uterine cancer, colon polyps. S/P hiatal hernia repair and cholecystectomy 02/2019. S/P right hip replacement surgery in 2016.  She presents to our office today to schedule a colonoscopy.  Her most recent colonoscopy was 08/30/2016 which identified four tubular adenomatous polyps which were removed from the descending, transverse and ascending colon.  She was advised by Dr. Ardis Hughs to repeat a colonoscopy in 3 years.  She denies having any upper or lower abdominal pain.  She had constipation in the past for which she used Senokot as needed.  She passes a loose or solid stool daily.  No rectal bleeding or melena.  She intentionally lost 10 pounds over the past 5 to 6 weeks after she was diagnosed with obstructive sleep apnea.  She is using CPAP which she dislikes.  She hopes if she loses enough weight she can stop using CPAP.   Her father was diagnosed with colon cancer the age of 64.  No GERD symptoms.  She is on Pantoprazole 20 mg once daily.  History of atrial fibrillation on Eliquis.  She is status post cardiac ablation 08/09/2019.  She denies having any breakthrough atrial fibrillation since having the cardiac ablation completed.  No chest pain, palpitations, dizziness or shortness of breath. She is followed by cardiologist, Dr. Curt Bears. Flecainide dose was recently reduced to 50mg  bid. EF 66% per [facility] 08/25/2017.   She reports having a herniated disc in her cervical spine.  If she required intubation she reports her neck must maintain a neutral position.  She denies having any complication with intubation with her past surgeries.   CBC Latest Ref Rng & Units 08/07/2019 03/13/2019 03/06/2019  WBC 3.4 - 10.8 x10E3/uL 7.1 7.5 6.3    Hemoglobin 11.1 - 15.9 g/dL 15.0 14.4 13.3  Hematocrit 34.0 - 46.6 % 44.2 43.7 40.2  Platelets 150 - 450 x10E3/uL 239 203 226   CMP Latest Ref Rng & Units 08/07/2019 03/13/2019 03/06/2019  Glucose 65 - 99 mg/dL 83 145(H) 98  BUN 8 - 27 mg/dL 23 12 23   Creatinine 0.57 - 1.00 mg/dL 0.78 0.77 0.78  Sodium 134 - 144 mmol/L 140 137 139  Potassium 3.5 - 5.2 mmol/L 4.8 4.9 4.1  Chloride 96 - 106 mmol/L 105 103 107  CO2 20 - 29 mmol/L 27 23 25   Calcium 8.7 - 10.3 mg/dL 9.8 9.1 9.1  Total Protein 6.5 - 8.1 g/dL - - 6.5  Total Bilirubin 0.3 - 1.2 mg/dL - - 0.6  Alkaline Phos 38 - 126 U/L - - 66  AST 15 - 41 U/L - - 22  ALT 0 - 44 U/L - - 21   Colonoscopy 08/30/2016 by Dr. Ardis Hughs: - Four 3 to 6 mm polyps in the descending colon, in the transverse colon and in the ascending   colon, removed with a cold snare. Resected and retrieved. - The examination was otherwise normal on direct and retroflexion views. - 3 year recall Surgical [P], descending, transverse and ascending-2, polyp (4) - TUBULAR ADENOMA (FIVE FRAGMENTS). - NO HIGH GRADE DYSPLASIA OR MALIGNANCY.  Past Medical History:  Diagnosis Date  . Chronic edema   . Constipation   . Difficult intubation    pt states that her neck needs  to be in a neutral position,  scoliosisand herniated dics in neck  . Family history of colon cancer   . Family history of uterine cancer   . GERD (gastroesophageal reflux disease)    protonix, hx h. pylori  . Herniated disc, cervical   . History of hiatal hernia   . History of sebaceous adenoma   . History of uterine cancer 04/22/2014   sees Dr. Ronita Hipps; s/p complete hysterectomy  . History of uterine cancer   . Hx of colonic polyp   . 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 Melanoma [facility])    sees Dr. Renda Rolls in dermatology right upper arm  . Obesity   . Paroxysmal atrial fibrillation (HCC)   . Peripheral vascular disease ([place])   . PONV (postoperative nausea and vomiting)   . Recurrent  UTI   . S/P hip replacement   . Scoliosis   . Thyroid nodule   . Urinary incontinence   . Uterine cancer (HCC)    Stage 1  . Venous insufficiency    s/p ablation   Past Surgical History:  Procedure Laterality Date  . ABDOMINAL HYSTERECTOMY  09/10/2013  . ATRIAL FIBRILLATION ABLATION N/A 08/09/2019   Procedure: ATRIAL FIBRILLATION ABLATION;  Surgeon: Constance Haw, MD;  Location: [place] [facility];  Service: Cardiovascular;  Laterality: N/A;  . BUNIONECTOMY  10/1976  . CHOLECYSTECTOMY N/A 03/12/2019   Procedure: XI ROBOTIC ASSISTED CHOLECYSTECTOMY;  Surgeon: Clovis Riley, MD;  Location: [facility];  Service: General;  Laterality: N/A;  . COLONOSCOPY    . FRACTURE SURGERY  09/1964   floor of orbit right side  . JOINT REPLACEMENT     bilateral hip replacements  . MELANOMA EXCISION  12/2011  . TOTAL HIP ARTHROPLASTY Left 06/17/2014   Procedure: LEFT TOTAL HIP ARTHROPLASTY ANTERIOR APPROACH;  Surgeon: Mcarthur Rossetti, MD;  Location: [place];  Service: Orthopedics;  Laterality: Left;  . TOTAL HIP ARTHROPLASTY Right 09/02/2014   Procedure: RIGHT TOTAL HIP ARTHROPLASTY ANTERIOR APPROACH;  Surgeon: Mcarthur Rossetti, MD;  Location: [place];  Service: Orthopedics;  Laterality: Right;  . VEIN SURGERY  05/2011   venous ablation    Social History: She is divorced.  She has 2 daughters.  She is a retired Forensic psychologist.  Non-smoker.  No alcohol.  No drug use.  Family History: family history includes Arthritis (age of onset: 46) in her sister; Cancer in her maternal grandmother and paternal uncle; Cancer (age of onset: 35) in her father; Diabetes (age of onset: 71) in her brother; Epilepsy (age of onset: 2) in her father; Hyperlipidemia in her father; Hypertension (age of onset: 41) in her father; Other in her paternal grandmother; Skin cancer in her daughter; Stroke in her paternal grandfather; Uterine cancer in her mother.    Allergies  Allergen Reactions  . Hydrolyzed Silk Hives and  Other (See Comments)    Silk tape-blisters  . Ciprofloxacin     Reactions with antiarrythmic  . Gluten Meal Other (See Comments)    reflux  . Metoprolol     dizzy      Outpatient Encounter Medications as of 11/21/2019  Medication Sig  . acetaminophen (TYLENOL) 500 MG tablet Take 2 tablets (1,000 mg total) by mouth every 6 (six) hours as needed.  . APPLE CIDER VINEGAR PO Take 1,500 mg by mouth 2 (two) times daily.   . Cranberry 500 MG TABS Take 1,000 mg by mouth 2 (two) times daily.  . cyclobenzaprine (FLEXERIL) 5 MG  tablet Take 1 tablet (5 mg total) by mouth 3 (three) times daily as needed for muscle spasms.  Marland Kitchen diltiazem (CARDIZEM) 60 MG tablet Take 60 mg daily if needed for palpitations (Patient taking differently: Take 60 mg by mouth as needed (Palpitation for afib). )  . diltiazem (TIAZAC) 240 MG 24 hr capsule TAKE 1 CAPSULE BY MOUTH EVERY DAY  . ELIQUIS 5 MG TABS tablet TAKE 1 TABLET(5 MG) BY MOUTH TWICE DAILY  . flecainide (TAMBOCOR) 50 MG tablet Take 1 tablet (50 mg total) by mouth 2 (two) times daily.  . methenamine (HIPREX) 1 g tablet Take 1 g by mouth 2 (two) times daily.  Marland Kitchen MYRBETRIQ 25 MG TB24 tablet Take 25 mg by mouth every other day.  Marland Kitchen OVER THE COUNTER MEDICATION Take 20 mg by mouth daily as needed (arthritis). CBD oil  . pantoprazole (PROTONIX) 20 MG tablet Take 20 mg by mouth daily.  . polyethylene glycol (MIRALAX / GLYCOLAX) packet Take 17 g by mouth daily. (Patient taking differently: Take 17 g by mouth as needed. )  . senna-docusate (SENOKOT-S) 8.6-50 MG tablet Take 1 tablet by mouth at bedtime. (Patient taking differently: Take 1 tablet by mouth at bedtime. )  . spironolactone (ALDACTONE) 50 MG tablet TAKE 2 TABLETS BY MOUTH EVERY MORNING AND 1 TABLET EVERY EVENING (Patient taking differently: Take 50-100 mg by mouth See admin instructions. Take 100 mg by mouth in the morning and 50 mg in the evening)  . traMADol (ULTRAM) 50 MG tablet Take 1 tablet (50 mg total) by  mouth every 6 (six) hours as needed (mild pain).  . triamcinolone cream (KENALOG) 0.1 % Apply 1 application topically daily as needed (for dry skin).   . vitamin C (ASCORBIC ACID) 500 MG tablet Take 500 mg by mouth daily.    No facility-administered encounter medications on file as of 11/21/2019.     REVIEW OF SYSTEMS:   Gen: Denies fever, sweats or chills.  CV: Denies chest pain, palpitations or edema. See HPI. Resp: Denies cough, shortness of breath of hemoptysis.  GI: See HPI.  GU : + urinary leaking. Denies urinary burning, blood in urine or increased urinary frequency.  MS: + back pain.  Derm: Denies rash, itchiness, skin lesions or unhealing ulcers. Psych: Denies depression, anxiety or memory loss.  Heme: Denies bruising, bleeding. Neuro:  Denies headaches, dizziness or paresthesias. Endo:  Denies any problems with DM, thyroid or adrenal function.    PHYSICAL EXAM: BP 108/64 (BP Location: Left Arm, Patient Position: Sitting, Cuff Size: Normal)   Pulse 68   Ht 5' 8.5" (1.74 m) Comment: height measured without shoes  Wt 230 lb 4 oz (104.4 kg)   BMI 34.50 kg/m   General: Well developed 69 year old female in no acute distress. Head: Normocephalic and atraumatic. Eyes:  Sclerae non-icteric, conjunctive pink. Ears: Normal auditory acuity. Mouth: Dentition intact. No ulcers or lesions.  Neck: Supple, no lymphadenopathy or thyromegaly.  Lungs: Clear bilaterally to auscultation without wheezes, crackles or rhonchi. Heart: Regular rate and rhythm. No murmur, rub or gallop appreciated.  Abdomen: Soft, nontender, non distended. No masses. No hepatosplenomegaly.  Ecchymotic patch 5cm left and above the umbilicus, small knot palpated without gross hematoma.  Normoactive bowel sounds x 4 quadrants.  Rectal: Deferred.  Musculoskeletal: Symmetrical with no gross deformities. Skin: Warm and dry. No rash or lesions on visible extremities. Extremities: No edema. Neurological: Alert  oriented x 4, no focal deficits.  Psychological:  Alert and cooperative. Normal  mood and affect.  ASSESSMENT AND PLAN:  70. 69 year old female with a past medical history of colon polyps. Family history of colon cancer (father) -Colonoscopy benefits and risks discussed including risk with sedation, risk of bleeding, perforation and infection  -Our office will contact the patient's cardiologist to verify Eliquis instructions prior to proceeding with a colonoscopy  2. Atrial fibrillation on Eliquis CHA2DS2VASc of 3. status post AF ablation 08/09/2019  3. GERD, stable on Pantoprazole 20mg  QD  4. OSA on Cpap  CC:  Lucretia Kern, DO

## 2019-11-21 NOTE — Telephone Encounter (Signed)
Pharmacy please comment on anticoagulation as requested in this patient.  Kerin Ransom PA-C 11/21/2019 2:49 PM

## 2019-11-22 DIAGNOSIS — G4733 Obstructive sleep apnea (adult) (pediatric): Secondary | ICD-10-CM | POA: Diagnosis not present

## 2019-11-22 NOTE — Progress Notes (Signed)
I agree with the above note, plan 

## 2019-11-25 NOTE — Telephone Encounter (Signed)
   Primary Cardiologist: Pixie Casino, MD  Chart reviewed as part of pre-operative protocol coverage. Patient was contacted 11/25/2019 in reference to pre-operative risk assessment for pending surgery as outlined below.  [name] was last seen on 11/14/2019 by Dr. Curt Bears.  Since that day, [name] has done well without chest pain or shortness of breath.  Therefore, based on ACC/AHA guidelines, the patient would be at acceptable risk for the planned procedure without further cardiovascular testing.   The patient was advised that if she develops new symptoms prior to surgery to contact our office to arrange for a follow-up visit, and she verbalized understanding.  I will route this recommendation to the requesting party via Epic fax function and remove from pre-op pool. Please call with questions.  She may hold Eliquis for 3 days prior to the procedure and restart as soon as possible afterward at the surgeon's discretion.   [place], [place] 11/25/2019, 10:11 AM

## 2019-11-26 NOTE — Telephone Encounter (Signed)
Left msg for pt- Ok to hold Eliquis x2 days prior to procedure. I have ask pt to let us know that she did rec our message.

## 2019-12-02 ENCOUNTER — Encounter: Payer: Self-pay | Admitting: Cardiology

## 2019-12-02 ENCOUNTER — Other Ambulatory Visit: Payer: Self-pay

## 2019-12-02 ENCOUNTER — Telehealth (INDEPENDENT_AMBULATORY_CARE_PROVIDER_SITE_OTHER): Payer: PPO | Admitting: Cardiology

## 2019-12-02 ENCOUNTER — Telehealth: Payer: Self-pay | Admitting: *Deleted

## 2019-12-02 VITALS — Ht 68.5 in | Wt 230.0 lb

## 2019-12-02 DIAGNOSIS — E669 Obesity, unspecified: Secondary | ICD-10-CM

## 2019-12-02 DIAGNOSIS — Z9989 Dependence on other enabling machines and devices: Secondary | ICD-10-CM | POA: Diagnosis not present

## 2019-12-02 DIAGNOSIS — G4733 Obstructive sleep apnea (adult) (pediatric): Secondary | ICD-10-CM | POA: Diagnosis not present

## 2019-12-02 NOTE — Telephone Encounter (Signed)
Order placed to choice via fax.

## 2019-12-02 NOTE — Telephone Encounter (Signed)
-----   Message from Sueanne Margarita, MD sent at 12/02/2019  9:42 AM EDT ----- Change CPAP to auto from 4 to 20cm H2O and get a download in2 weeks. Followup with me in 1 year

## 2019-12-02 NOTE — Progress Notes (Signed)
Opened in error

## 2019-12-02 NOTE — Progress Notes (Signed)
Virtual Visit via Telephone Note   This visit type was conducted due to national recommendations for restrictions regarding the COVID-19 Pandemic (e.g. social distancing) in an effort to limit this patient's exposure and mitigate transmission in our community.  Due to her co-morbid illnesses, this patient is at least at moderate risk for complications without adequate follow up.  This format is felt to be most appropriate for this patient at this time.  The patient did not have access to video technology/had technical difficulties with video requiring transitioning to audio format only (telephone).  All issues noted in this document were discussed and addressed.  No physical exam could be performed with this format.  Please refer to the patient's chart for her  consent to telehealth for [facility].   Evaluation Performed:  Follow-up visit  This visit type was conducted due to national recommendations for restrictions regarding the COVID-19 Pandemic (e.g. social distancing).  This format is felt to be most appropriate for this patient at this time.  All issues noted in this document were discussed and addressed.  No physical exam was performed (except for noted visual exam findings with Video Visits).  Please refer to the patient's chart (MyChart message for video visits and phone note for telephone visits) for the patient's consent to telehealth for [facility].  Date:  12/02/2019   ID:  [name], DOB [birth year], MRN [id number]  Patient Location:  Home  Provider location:   [facility] Simmonds  PCP:  Patient, No Pcp Per  Cardiologist:  Pixie Casino, MD  Sleep Medicine:  Fransico Him, MD Electrophysiologist:  Constance Haw, MD   Chief Complaint:  OSA  History of Present Illness:    [name] is a 69 y.o. female who presents via audio/video conferencing for a telehealth visit today.    This is a 69yo female with a hx of GERD and PAF who was referred by Dr. Curt Bears for  sleep study due to PAF and excessive daytime sleepiness with an Epworth Sleepiness score of 11. She tells me that she was having some headaches in the am and also would wake up a lot at night.  She underwent split night sleep study showing severe OSA with an AHI of 49.8/hr and O2 desaturations at low as 84%.  She underwent CPAP titration to 16cm H2O.    She is doing well with her CPAP device and thinks that she has gotten used to it.  She tolerates the mask and feels the pressure is adequate.  Since going on CPAP she feels rested in the am and has no significant daytime sleepiness.  She has chronic nasal congestion from a deviated nasal septum.  She does not think that she snores.  Her morning HAs have resolved.   The patient does not have symptoms concerning for COVID-19 infection (fever, chills, cough, or new shortness of breath).    Prior CV studies:   The following studies were reviewed today:  Split night sleep study and PAP compliance download  Past Medical History:  Diagnosis Date  . Chronic edema   . Constipation   . Difficult intubation    pt states that her neck needs to be in a neutral position,  scoliosisand herniated dics in neck  . Family history of colon cancer   . Family history of uterine cancer   . Gallstones   . GERD (gastroesophageal reflux disease)    protonix, hx h. pylori  . Herniated disc, cervical   .  History of hiatal hernia   . History of sebaceous adenoma   . History of uterine cancer 04/22/2014   sees Dr. Ronita Hipps; s/p complete hysterectomy  . Hx of colonic polyp   . 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 Melanoma [facility])    sees Dr. Renda Rolls in dermatology right upper arm  . Obesity   . OSA on CPAP   . Paroxysmal atrial fibrillation (HCC)   . Peripheral vascular disease ([place])   . PONV (postoperative nausea and vomiting)   . Recurrent UTI   . S/P hip replacement   . Scoliosis   . Thyroid nodule   . Urinary incontinence   . Uterine cancer (HCC)     Stage 1  . Venous insufficiency    s/p ablation   Past Surgical History:  Procedure Laterality Date  . ABDOMINAL HYSTERECTOMY  09/10/2013  . ATRIAL FIBRILLATION ABLATION N/A 08/09/2019   Procedure: ATRIAL FIBRILLATION ABLATION;  Surgeon: Constance Haw, MD;  Location: [place] [facility];  Service: Cardiovascular;  Laterality: N/A;  . BUNIONECTOMY  10/1976  . CHOLECYSTECTOMY N/A 03/12/2019   Procedure: XI ROBOTIC ASSISTED CHOLECYSTECTOMY;  Surgeon: Clovis Riley, MD;  [facility];  Service: General;  Laterality: N/A;  . COLONOSCOPY    . FRACTURE SURGERY  09/1964   floor of orbit right side  . HIATAL HERNIA REPAIR    . JOINT REPLACEMENT     bilateral hip replacements  . MELANOMA EXCISION  12/2011  . TOTAL HIP ARTHROPLASTY Left 06/17/2014   Procedure: LEFT TOTAL HIP ARTHROPLASTY ANTERIOR APPROACH;  Surgeon: Mcarthur Rossetti, MD;  Location: [place];  Service: Orthopedics;  Laterality: Left;  . TOTAL HIP ARTHROPLASTY Right 09/02/2014   Procedure: RIGHT TOTAL HIP ARTHROPLASTY ANTERIOR APPROACH;  Surgeon: Mcarthur Rossetti, MD;  Location: [place];  Service: Orthopedics;  Laterality: Right;  . VEIN SURGERY  05/2011   venous ablation      Current Meds  Medication Sig  . acetaminophen (TYLENOL) 500 MG tablet Take 2 tablets (1,000 mg total) by mouth every 6 (six) hours as needed.  . Cranberry 500 MG TABS Take 1,000 mg by mouth 2 (two) times daily.  . cyclobenzaprine (FLEXERIL) 5 MG tablet Take 1 tablet (5 mg total) by mouth 3 (three) times daily as needed for muscle spasms.  Marland Kitchen diltiazem (CARDIZEM) 60 MG tablet Take 60 mg daily if needed for palpitations (Patient taking differently: Take 60 mg by mouth as needed (Palpitation for afib). )  . diltiazem (TIAZAC) 240 MG 24 hr capsule TAKE 1 CAPSULE BY MOUTH EVERY DAY  . ELIQUIS 5 MG TABS tablet TAKE 1 TABLET(5 MG) BY MOUTH TWICE DAILY  . flecainide (TAMBOCOR) 50 MG tablet Take 1 tablet (50 mg total) by mouth 2 (two) times daily.    . methenamine (HIPREX) 1 g tablet Take 1 g by mouth 2 (two) times daily.  Marland Kitchen MYRBETRIQ 25 MG TB24 tablet Take 25 mg by mouth every other day.  . Na Sulfate-K Sulfate-Mg Sulf (SUPREP BOWEL PREP KIT) 17.5-3.13-1.6 GM/177ML SOLN Take 1 kit by mouth as directed. For colonoscopy prep  . OVER THE COUNTER MEDICATION Take 20 mg by mouth daily as needed (arthritis). CBD oil  . pantoprazole (PROTONIX) 20 MG tablet Take 20 mg by mouth daily.  . polyethylene glycol (MIRALAX / GLYCOLAX) packet Take 17 g by mouth daily. (Patient taking differently: Take 17 g by mouth as needed. )  . senna-docusate (SENOKOT-S) 8.6-50 MG tablet Take 1 tablet by mouth  at bedtime. (Patient taking differently: Take 1 tablet by mouth as needed. )  . spironolactone (ALDACTONE) 50 MG tablet TAKE 2 TABLETS BY MOUTH EVERY MORNING AND 1 TABLET EVERY EVENING (Patient taking differently: Take 50-100 mg by mouth See admin instructions. Take 100 mg by mouth in the morning and 50 mg in the evening)  . traMADol (ULTRAM) 50 MG tablet Take 1 tablet (50 mg total) by mouth every 6 (six) hours as needed (mild pain).  . triamcinolone cream (KENALOG) 0.1 % Apply 1 application topically daily as needed (for dry skin).   . vitamin C (ASCORBIC ACID) 500 MG tablet Take 500 mg by mouth daily.      Allergies:   Hydrolyzed silk, Ciprofloxacin, Gluten meal, and Metoprolol   Social History   Tobacco Use  . Smoking status: Never Smoker  . Smokeless tobacco: Never Used  Vaping Use  . Vaping Use: Never used  Substance Use Topics  . Alcohol use: Yes    Alcohol/week: 0.0 standard drinks    Comment: rarely at [facility]   . Drug use: No     Family Hx: The patient's family history includes Arthritis (age of onset: 41) in her sister; Cancer in her maternal grandmother and paternal uncle; Colon cancer (age of onset: 28) in her father; Diabetes (age of onset: 46) in her brother; Emphysema in her mother; Epilepsy (age of onset: 30) in her father; Hyperlipidemia  in her father; Hypertension (age of onset: 70) in her father; Leukemia in her father; Other in her paternal grandmother; Skin cancer in her daughter; Stroke in her paternal grandfather; Uterine cancer in her mother.  ROS:   Please see the history of present illness.     All other systems reviewed and are negative.   Labs/Other Tests and Data Reviewed:    Recent Labs: 03/06/2019: ALT 21 03/13/2019: Magnesium 2.0 08/07/2019: BUN 23; Creatinine, Ser 0.78; Hemoglobin 15.0; Platelets 239; Potassium 4.8; Sodium 140   Recent Lipid Panel Lab Results  Component Value Date/Time   CHOL 180 03/20/2018 11:17 AM   TRIG 53.0 09/07/2015 12:11 PM   HDL 45.20 03/20/2018 11:17 AM   CHOLHDL 3 09/07/2015 12:11 PM   LDLCALC 87 09/07/2015 12:11 PM    Wt Readings from Last 3 Encounters:  12/02/19 230 lb (104.3 kg)  11/21/19 230 lb 4 oz (104.4 kg)  11/14/19 231 lb (104.8 kg)     Objective:    Vital Signs:  Ht 5' 8.5" (1.74 m)   Wt 230 lb (104.3 kg)   BMI 34.46 kg/m     ASSESSMENT & PLAN:    1.  OSA - The pathophysiology of obstructive sleep apnea , it's cardiovascular consequences & modes of treatment including CPAP were discused with the patient in detail & they evidenced understanding.  The patient is tolerating PAP therapy well without any problems. The PAP download was reviewed today and showed an AHI of 9.6/hr on 16 cm H2O with 97% compliance in using more than 4 hours nightly.  The patient has been using and benefiting from PAP use and will continue to benefit from therapy.  -her AHI is still elevated so I will change her to Auto CPAP from 4-20cm H2O and get a download in 2 weeks  2.  Obesity -I have encouraged her to get into a routine exercise program and cut back on carbs and portions.   COVID-19 Education: The signs and symptoms of COVID-19 were discussed with the patient and how to seek care for testing (  follow up with PCP or arrange E-visit).  The importance of social distancing was  discussed today.  Patient Risk:   After full review of this patient's clinical status, I feel that they are at least moderate risk at this time.  Time:   Today, I have spent 20 minutes on telemedicine discussing medical problems including OSA, obesity and reviewing patient's chart including Split night sleep study and PAP compliance download.  Medication Adjustments/Labs and Tests Ordered: Current medicines are reviewed at length with the patient today.  Concerns regarding medicines are outlined above.  Tests Ordered: No orders of the defined types were placed in this encounter.  Medication Changes: No orders of the defined types were placed in this encounter.   Disposition:  Follow up in 1 year(s)  Signed, Fransico Him, MD  12/02/2019 9:22 AM     [facility]

## 2019-12-04 NOTE — Telephone Encounter (Signed)
Pt returned call. States that she was advised by cardiology and voiced understanding.

## 2019-12-19 DIAGNOSIS — G4733 Obstructive sleep apnea (adult) (pediatric): Secondary | ICD-10-CM | POA: Diagnosis not present

## 2019-12-19 DIAGNOSIS — H43813 Vitreous degeneration, bilateral: Secondary | ICD-10-CM | POA: Diagnosis not present

## 2019-12-21 ENCOUNTER — Other Ambulatory Visit: Payer: Self-pay | Admitting: Cardiology

## 2019-12-22 DIAGNOSIS — G4733 Obstructive sleep apnea (adult) (pediatric): Secondary | ICD-10-CM | POA: Diagnosis not present

## 2019-12-26 ENCOUNTER — Telehealth: Payer: Self-pay | Admitting: *Deleted

## 2019-12-26 NOTE — Telephone Encounter (Signed)
Message sent to dr Claiborne Billings:  could you please assist dr Radford Pax and write an order to decrease this patients cpap from 20 cm to 16 cm she can not tolerate the high pressure. She is pleading with [place] to make the change.   From dr Claiborne Billings: ok to decrease to 16 cm  Pressure was changed via the modem and patient has been notified.

## 2019-12-26 NOTE — Telephone Encounter (Addendum)
Received a voice vail message from patient this morning stating how upset she was that she was awake at 1:00 am despite being on her cpap. She complains that her pressure is too high and that she had asked for a pressure change back down to 16 cm 4 times. She realizes 16 cm might not be therapeutic but she could sleep with that pressure. Patient states " I cannot remain sleep deprived until then." I worked with one of the nurses to find another suitable appointment time and sent a mychart message to the patient to see if she agreed with the new appointment time.

## 2019-12-26 NOTE — Telephone Encounter (Signed)
Return message" Pt is agreeable to new appointment time.

## 2019-12-30 ENCOUNTER — Telehealth: Payer: Self-pay | Admitting: *Deleted

## 2019-12-30 DIAGNOSIS — G4733 Obstructive sleep apnea (adult) (pediatric): Secondary | ICD-10-CM

## 2019-12-30 NOTE — Telephone Encounter (Signed)
-----   Message from [facility] sent at 12/26/2019  3:06 PM EDT ----- Regarding: RE: precert Does there need to be an order for this? ----- Message ----- From: [name], CMA Sent: 12/25/2019   5:24 PM EDT To: Cv Div Sleep Studies Subject: precert                                        In lab bipap titration

## 2019-12-31 ENCOUNTER — Other Ambulatory Visit: Payer: Self-pay

## 2019-12-31 ENCOUNTER — Encounter: Payer: Self-pay | Admitting: Cardiology

## 2019-12-31 ENCOUNTER — Telehealth (INDEPENDENT_AMBULATORY_CARE_PROVIDER_SITE_OTHER): Payer: PPO | Admitting: Cardiology

## 2019-12-31 VITALS — Ht 68.5 in | Wt 225.0 lb

## 2019-12-31 DIAGNOSIS — E669 Obesity, unspecified: Secondary | ICD-10-CM

## 2019-12-31 DIAGNOSIS — G4733 Obstructive sleep apnea (adult) (pediatric): Secondary | ICD-10-CM | POA: Diagnosis not present

## 2019-12-31 DIAGNOSIS — Z9989 Dependence on other enabling machines and devices: Secondary | ICD-10-CM | POA: Diagnosis not present

## 2019-12-31 NOTE — Progress Notes (Addendum)
Virtual Visit via Telephone Note   This visit type was conducted due to national recommendations for restrictions regarding the COVID-19 Pandemic (e.g. social distancing) in an effort to limit this patient's exposure and mitigate transmission in our community.  Due to her co-morbid illnesses, this patient is at least at moderate risk for complications without adequate follow up.  This format is felt to be most appropriate for this patient at this time.  The patient did not have access to video technology/had technical difficulties with video requiring transitioning to audio format only (telephone).  All issues noted in this document were discussed and addressed.  No physical exam could be performed with this format.  Please refer to the patient's chart for her  consent to telehealth for [facility].   Evaluation Performed:  Follow-up visit  This visit type was conducted due to national recommendations for restrictions regarding the COVID-19 Pandemic (e.g. social distancing).  This format is felt to be most appropriate for this patient at this time.  All issues noted in this document were discussed and addressed.  No physical exam was performed (except for noted visual exam findings with Video Visits).  Please refer to the patient's chart (MyChart message for video visits and phone note for telephone visits) for the patient's consent to telehealth for [facility].  Date:  12/31/2019   ID:  [name], DOB [birth year], MRN [id number]  Patient Location:  Home  Provider location:   Lourena Simmonds  PCP:  Lucretia Kern, DO  Cardiologist:  Pixie Casino, MD  Sleep Medicine:  Fransico Him, MD Electrophysiologist:  Constance Haw, MD   Chief Complaint:  OSA  History of Present Illness:    [name] is a 69 y.o. female who presents via audio/video conferencing for a telehealth visit today.    This is a 69yo female with a hx of GERD and PAF who was referred by Dr. Curt Bears for  sleep study due to PAF and excessive daytime sleepiness with an Epworth Sleepiness score of 11. She tells me that she was having some headaches in the am and also would wake up a lot at night.  She underwent split night sleep study showing severe OSA with an AHI of 49.8/hr and O2 desaturations at low as 84%.  She underwent CPAP titration to 16cm H2O.  Due to ongoing apneas at set CPAP pressure, she was changed to auto CPAP but her apneas continued.  I recommended referring back to sleep lab for biPAP titration but she did not want to proceed until she talked with me.   She is very upset today.  She states that she does not understand why she has to have a BiPAP titration.  She was very upset that she had sent in a my chart message that she felt was not addressed in a timely fashion about her having problems on the higher CPAP pressure.  She was adamant that she was on CPAP at 20cm H2O and not an auto pressure (this was set at 4-20cm H2O auto).  She said that after she had her sleep study and CPAP titration she was not set up with an OV right away and I tried to explain that insurance requires a 60-90 day followup after receiving her device and she needed to get her PAP device first before we set up the OV.  The DME is supposed to call us to notify us when the device has been set up.    She  says that she cannot use any pressure higher than 16cm H2O.  Unfortunately she still has a lot of apneas on 16cm H2O and even on auto CPAP.     Prior CV studies:   The following studies were reviewed today:  Split night sleep study and PAP compliance download  Past Medical History:  Diagnosis Date  . Chronic edema   . Constipation   . Difficult intubation    pt states that her neck needs to be in a neutral position,  scoliosisand herniated dics in neck  . Family history of colon cancer   . Family history of uterine cancer   . Gallstones   . GERD (gastroesophageal reflux disease)    protonix, hx h. pylori  .  Herniated disc, cervical   . History of hiatal hernia   . History of sebaceous adenoma   . History of uterine cancer 04/22/2014   sees Dr. Ronita Hipps; s/p complete hysterectomy  . Hx of colonic polyp   . 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 Melanoma [facility])    sees Dr. Renda Rolls in dermatology right upper arm  . Obesity   . OSA on CPAP   . Paroxysmal atrial fibrillation (HCC)   . Peripheral vascular disease ([place])   . PONV (postoperative nausea and vomiting)   . Recurrent UTI   . S/P hip replacement   . Scoliosis   . Thyroid nodule   . Urinary incontinence   . Uterine cancer (HCC)    Stage 1  . Venous insufficiency    s/p ablation   Past Surgical History:  Procedure Laterality Date  . ABDOMINAL HYSTERECTOMY  09/10/2013  . ATRIAL FIBRILLATION ABLATION N/A 08/09/2019   Procedure: ATRIAL FIBRILLATION ABLATION;  Surgeon: Constance Haw, MD;  Location: [place] [facility];  Service: Cardiovascular;  Laterality: N/A;  . BUNIONECTOMY  10/1976  . CHOLECYSTECTOMY N/A 03/12/2019   Procedure: XI ROBOTIC ASSISTED CHOLECYSTECTOMY;  Surgeon: Clovis Riley, MD;  Location: [facility];  Service: General;  Laterality: N/A;  . COLONOSCOPY    . FRACTURE SURGERY  09/1964   floor of orbit right side  . HIATAL HERNIA REPAIR    . JOINT REPLACEMENT     bilateral hip replacements  . MELANOMA EXCISION  12/2011  . TOTAL HIP ARTHROPLASTY Left 06/17/2014   Procedure: LEFT TOTAL HIP ARTHROPLASTY ANTERIOR APPROACH;  Surgeon: Mcarthur Rossetti, MD;  Location: [place];  Service: Orthopedics;  Laterality: Left;  . TOTAL HIP ARTHROPLASTY Right 09/02/2014   Procedure: RIGHT TOTAL HIP ARTHROPLASTY ANTERIOR APPROACH;  Surgeon: Mcarthur Rossetti, MD;  Location: [place];  Service: Orthopedics;  Laterality: Right;  . VEIN SURGERY  05/2011   venous ablation      Current Meds  Medication Sig  . acetaminophen (TYLENOL) 500 MG tablet Take 2 tablets (1,000 mg total) by mouth every 6 (six) hours as needed.  .  Cranberry 500 MG TABS Take 1,000 mg by mouth 2 (two) times daily.  . cyclobenzaprine (FLEXERIL) 5 MG tablet Take 1 tablet (5 mg total) by mouth 3 (three) times daily as needed for muscle spasms.  Marland Kitchen diltiazem (CARDIZEM) 60 MG tablet Take 60 mg daily if needed for palpitations (Patient taking differently: Take 60 mg by mouth as needed (Palpitation for afib). )  . diltiazem (TIAZAC) 240 MG 24 hr capsule TAKE 1 CAPSULE BY MOUTH EVERY DAY  . ELIQUIS 5 MG TABS tablet TAKE 1 TABLET(5 MG) BY MOUTH TWICE DAILY  . flecainide (TAMBOCOR) 50 MG tablet  Take 1 tablet (50 mg total) by mouth 2 (two) times daily.  . methenamine (HIPREX) 1 g tablet Take 1 g by mouth 2 (two) times daily.  Marland Kitchen MYRBETRIQ 25 MG TB24 tablet Take 25 mg by mouth every other day.  . Na Sulfate-K Sulfate-Mg Sulf (SUPREP BOWEL PREP KIT) 17.5-3.13-1.6 GM/177ML SOLN Take 1 kit by mouth as directed. For colonoscopy prep  . OVER THE COUNTER MEDICATION Take 20 mg by mouth daily as needed (arthritis). CBD oil  . polyethylene glycol (MIRALAX / GLYCOLAX) packet Take 17 g by mouth daily. (Patient taking differently: Take 17 g by mouth as needed. )  . senna-docusate (SENOKOT-S) 8.6-50 MG tablet Take 1 tablet by mouth at bedtime. (Patient taking differently: Take 1 tablet by mouth as needed. )  . spironolactone (ALDACTONE) 50 MG tablet TAKE 2 TABLETS BY MOUTH EVERY MORNING AND 1 TABLET EVERY EVENING (Patient taking differently: Take 50-100 mg by mouth See admin instructions. Take 100 mg by mouth in the morning and 50 mg in the evening)  . traMADol (ULTRAM) 50 MG tablet Take 1 tablet (50 mg total) by mouth every 6 (six) hours as needed (mild pain).  . triamcinolone cream (KENALOG) 0.1 % Apply 1 application topically daily as needed (for dry skin).   . vitamin C (ASCORBIC ACID) 500 MG tablet Take 500 mg by mouth daily.      Allergies:   Hydrolyzed silk, Ciprofloxacin, Gluten meal, and Metoprolol   Social History   Tobacco Use  . Smoking status: Never  Smoker  . Smokeless tobacco: Never Used  Vaping Use  . Vaping Use: Never used  Substance Use Topics  . Alcohol use: Yes    Alcohol/week: 0.0 standard drinks    Comment: rarely at [facility]   . Drug use: No     Family Hx: The patient's family history includes Arthritis (age of onset: 28) in her sister; Cancer in her maternal grandmother and paternal uncle; Colon cancer (age of onset: 44) in her father; Diabetes (age of onset: 29) in her brother; Emphysema in her mother; Epilepsy (age of onset: 65) in her father; Hyperlipidemia in her father; Hypertension (age of onset: 21) in her father; Leukemia in her father; Other in her paternal grandmother; Skin cancer in her daughter; Stroke in her paternal grandfather; Uterine cancer in her mother.  ROS:   Please see the history of present illness.     All other systems reviewed and are negative.   Labs/Other Tests and Data Reviewed:    Recent Labs: 03/06/2019: ALT 21 03/13/2019: Magnesium 2.0 08/07/2019: BUN 23; Creatinine, Ser 0.78; Hemoglobin 15.0; Platelets 239; Potassium 4.8; Sodium 140   Recent Lipid Panel Lab Results  Component Value Date/Time   CHOL 180 03/20/2018 11:17 AM   TRIG 53.0 09/07/2015 12:11 PM   HDL 45.20 03/20/2018 11:17 AM   CHOLHDL 3 09/07/2015 12:11 PM   LDLCALC 87 09/07/2015 12:11 PM    Wt Readings from Last 3 Encounters:  12/31/19 225 lb (102.1 kg)  12/02/19 230 lb (104.3 kg)  11/21/19 230 lb 4 oz (104.4 kg)     Objective:    Vital Signs:  Ht 5' 8.5" (1.74 m)   Wt 225 lb (102.1 kg)   BMI 33.71 kg/m     ASSESSMENT & PLAN:    1.  OSA  -Her AHI was too high on CPAP at 16cm H2O and was placed on auto CPAP from 4-20cm.  She is adamant that she was placed on CPAP at 20cm  H2O instead of auto but I reassured her that the download showed she was on auto.  I tried to explain to her that the reason she saw the 20 on her machine was likely that she has significant apneas and the machine had to go to the upper  limit to try to control the number of apnea.  -we discussed the pathophysiology and treatment of OSA at length -I tried to explain to her that auto CPAP or CPAP set at a certain level is not adequate to treat her degree of sleep apnea -I talked with her extensively about BiPAP and how it worked and why I thought this would be a better optoin for her but would recommend that we do an in lab study. -she stated that she had to understand BiPAP fully and how it work in order to consider using it and she was not satisfied with the explanation of how it worked.  I tried to explain how it worked again. -she inquired about the possibility of Inspire device although her weight is borderline to be included (usually BMI < 32 and her BMI is 33.7). -she has already made an appt with Dr. Redmond Baseman next month  2.  Obesity -I have encouraged her to get into a routine exercise program and cut back on carbs and portions.   COVID-19 Education: The signs and symptoms of COVID-19 were discussed with the patient and how to seek care for testing (follow up with PCP or arrange E-visit).  The importance of social distancing was discussed today.  Patient Risk:   After full review of this patient's clinical status, I feel that they are at least moderate risk at this time.  Time:   Today, I have spent 30 minutes on telemedicine discussing medical problems including OSA, obesity and reviewing patient's chart including Split night sleep study and PAP compliance download.  Medication Adjustments/Labs and Tests Ordered: Current medicines are reviewed at length with the patient today.  Concerns regarding medicines are outlined above.  Tests Ordered: No orders of the defined types were placed in this encounter.  Medication Changes: No orders of the defined types were placed in this encounter.   Disposition:  Follow up pending evaluation with Dr. Redmond Baseman with ENT  Signed, Fransico Him, MD  12/31/2019 12:48 PM    [place]

## 2020-01-02 NOTE — Addendum Note (Signed)
Addended by: Fransico Him R on: 01/02/2020 01:11 PM   Modules accepted: Level of Service

## 2020-01-06 ENCOUNTER — Encounter: Payer: Self-pay | Admitting: Gastroenterology

## 2020-01-14 ENCOUNTER — Telehealth: Payer: PPO | Admitting: Cardiology

## 2020-01-16 DIAGNOSIS — Z6833 Body mass index (BMI) 33.0-33.9, adult: Secondary | ICD-10-CM | POA: Diagnosis not present

## 2020-01-16 DIAGNOSIS — I4891 Unspecified atrial fibrillation: Secondary | ICD-10-CM | POA: Diagnosis not present

## 2020-01-16 DIAGNOSIS — G4733 Obstructive sleep apnea (adult) (pediatric): Secondary | ICD-10-CM | POA: Diagnosis not present

## 2020-01-16 DIAGNOSIS — J343 Hypertrophy of nasal turbinates: Secondary | ICD-10-CM | POA: Diagnosis not present

## 2020-01-17 ENCOUNTER — Ambulatory Visit (HOSPITAL_COMMUNITY)
Admission: RE | Admit: 2020-01-17 | Discharge: 2020-01-17 | Disposition: A | Discharge status: Home / Self Care | Payer: PPO | Source: Ambulatory Visit | Attending: Gastroenterology | Admitting: Gastroenterology

## 2020-01-17 ENCOUNTER — Encounter (HOSPITAL_COMMUNITY): Payer: Self-pay | Admitting: Gastroenterology

## 2020-01-17 ENCOUNTER — Ambulatory Visit (HOSPITAL_COMMUNITY): Payer: PPO | Admitting: Certified Registered Nurse Anesthetist

## 2020-01-17 ENCOUNTER — Encounter: Payer: Self-pay | Admitting: Gastroenterology

## 2020-01-17 ENCOUNTER — Other Ambulatory Visit: Payer: Self-pay

## 2020-01-17 ENCOUNTER — Encounter (HOSPITAL_COMMUNITY)
Admission: RE | Disposition: A | Discharge status: Home / Self Care | Payer: Self-pay | Source: Ambulatory Visit | Attending: Gastroenterology

## 2020-01-17 ENCOUNTER — Ambulatory Visit: Payer: PPO | Admitting: Gastroenterology

## 2020-01-17 VITALS — BP 109/72 | HR 82 | Temp 97.8°F | Ht 68.0 in | Wt 230.0 lb

## 2020-01-17 DIAGNOSIS — Z86010 Personal history of colonic polyps: Secondary | ICD-10-CM

## 2020-01-17 DIAGNOSIS — Z1211 Encounter for screening for malignant neoplasm of colon: Secondary | ICD-10-CM | POA: Diagnosis not present

## 2020-01-17 DIAGNOSIS — K635 Polyp of colon: Secondary | ICD-10-CM | POA: Insufficient documentation

## 2020-01-17 DIAGNOSIS — G4733 Obstructive sleep apnea (adult) (pediatric): Secondary | ICD-10-CM | POA: Insufficient documentation

## 2020-01-17 DIAGNOSIS — D122 Benign neoplasm of ascending colon: Secondary | ICD-10-CM

## 2020-01-17 DIAGNOSIS — Z6834 Body mass index (BMI) 34.0-34.9, adult: Secondary | ICD-10-CM | POA: Diagnosis not present

## 2020-01-17 DIAGNOSIS — Z9049 Acquired absence of other specified parts of digestive tract: Secondary | ICD-10-CM | POA: Insufficient documentation

## 2020-01-17 DIAGNOSIS — Z8542 Personal history of malignant neoplasm of other parts of uterus: Secondary | ICD-10-CM | POA: Diagnosis not present

## 2020-01-17 DIAGNOSIS — Z8349 Family history of other endocrine, nutritional and metabolic diseases: Secondary | ICD-10-CM | POA: Diagnosis not present

## 2020-01-17 DIAGNOSIS — Z809 Family history of malignant neoplasm, unspecified: Secondary | ICD-10-CM | POA: Insufficient documentation

## 2020-01-17 DIAGNOSIS — Z8261 Family history of arthritis: Secondary | ICD-10-CM | POA: Insufficient documentation

## 2020-01-17 DIAGNOSIS — K648 Other hemorrhoids: Secondary | ICD-10-CM | POA: Diagnosis not present

## 2020-01-17 DIAGNOSIS — Z823 Family history of stroke: Secondary | ICD-10-CM | POA: Diagnosis not present

## 2020-01-17 DIAGNOSIS — Z8249 Family history of ischemic heart disease and other diseases of the circulatory system: Secondary | ICD-10-CM | POA: Insufficient documentation

## 2020-01-17 DIAGNOSIS — K5730 Diverticulosis of large intestine without perforation or abscess without bleeding: Secondary | ICD-10-CM | POA: Diagnosis not present

## 2020-01-17 DIAGNOSIS — I4891 Unspecified atrial fibrillation: Secondary | ICD-10-CM | POA: Diagnosis not present

## 2020-01-17 DIAGNOSIS — Z800 Family history of malignant neoplasm of digestive organs: Secondary | ICD-10-CM | POA: Diagnosis not present

## 2020-01-17 DIAGNOSIS — Z79899 Other long term (current) drug therapy: Secondary | ICD-10-CM | POA: Diagnosis not present

## 2020-01-17 DIAGNOSIS — E669 Obesity, unspecified: Secondary | ICD-10-CM | POA: Diagnosis not present

## 2020-01-17 DIAGNOSIS — Z881 Allergy status to other antibiotic agents status: Secondary | ICD-10-CM | POA: Insufficient documentation

## 2020-01-17 DIAGNOSIS — I739 Peripheral vascular disease, unspecified: Secondary | ICD-10-CM | POA: Diagnosis not present

## 2020-01-17 DIAGNOSIS — Z833 Family history of diabetes mellitus: Secondary | ICD-10-CM | POA: Diagnosis not present

## 2020-01-17 DIAGNOSIS — Z8049 Family history of malignant neoplasm of other genital organs: Secondary | ICD-10-CM | POA: Insufficient documentation

## 2020-01-17 DIAGNOSIS — Z820 Family history of epilepsy and other diseases of the nervous system: Secondary | ICD-10-CM | POA: Insufficient documentation

## 2020-01-17 DIAGNOSIS — I480 Paroxysmal atrial fibrillation: Secondary | ICD-10-CM | POA: Diagnosis not present

## 2020-01-17 DIAGNOSIS — Z85820 Personal history of malignant melanoma of skin: Secondary | ICD-10-CM | POA: Diagnosis not present

## 2020-01-17 DIAGNOSIS — Z7901 Long term (current) use of anticoagulants: Secondary | ICD-10-CM | POA: Insufficient documentation

## 2020-01-17 DIAGNOSIS — K649 Unspecified hemorrhoids: Secondary | ICD-10-CM | POA: Diagnosis not present

## 2020-01-17 DIAGNOSIS — Z20822 Contact with and (suspected) exposure to covid-19: Secondary | ICD-10-CM | POA: Insufficient documentation

## 2020-01-17 DIAGNOSIS — Z888 Allergy status to other drugs, medicaments and biological substances status: Secondary | ICD-10-CM | POA: Insufficient documentation

## 2020-01-17 HISTORY — PX: POLYPECTOMY: SHX5525

## 2020-01-17 HISTORY — PX: COLONOSCOPY WITH PROPOFOL: SHX5780

## 2020-01-17 LAB — RESPIRATORY PANEL BY RT PCR (FLU A&B, COVID)
Influenza A by PCR: NEGATIVE
Influenza B by PCR: NEGATIVE
SARS Coronavirus 2 by RT PCR: NEGATIVE

## 2020-01-17 SURGERY — COLONOSCOPY WITH PROPOFOL
Anesthesia: Monitor Anesthesia Care

## 2020-01-17 MED ORDER — LIDOCAINE 2% (20 MG/ML) 5 ML SYRINGE
INTRAMUSCULAR | Status: DC | PRN
  Administered 2020-01-17: 100 mg via INTRAVENOUS

## 2020-01-17 MED ORDER — LACTATED RINGERS IV SOLN: INTRAVENOUS | Status: DC | PRN

## 2020-01-17 MED ORDER — PROPOFOL 10 MG/ML IV BOLUS
INTRAVENOUS | Status: DC | PRN
  Administered 2020-01-17 (×3): 20 mg via INTRAVENOUS

## 2020-01-17 MED ORDER — PROPOFOL 500 MG/50ML IV EMUL
INTRAVENOUS | Status: DC | PRN
  Administered 2020-01-17: 100 ug/kg/min via INTRAVENOUS

## 2020-01-17 MED ORDER — SODIUM CHLORIDE 0.9 % IV SOLN: INTRAVENOUS | Status: DC

## 2020-01-17 MED ORDER — PROPOFOL 500 MG/50ML IV EMUL
INTRAVENOUS | Status: AC
  Filled 2020-01-17: qty 150

## 2020-01-17 MED ORDER — ONDANSETRON HCL 4 MG/2ML IJ SOLN
INTRAMUSCULAR | Status: DC | PRN
  Administered 2020-01-17: 4 mg via INTRAVENOUS

## 2020-01-17 MED ORDER — SODIUM CHLORIDE 0.9 % IV SOLN
500.0000 mL | Freq: Once | INTRAVENOUS | Status: DC
Start: 2020-01-17 — End: 2020-05-13

## 2020-01-17 SURGICAL SUPPLY — 21 items

## 2020-01-17 NOTE — Progress Notes (Signed)
VS by CW. ?

## 2020-01-17 NOTE — Progress Notes (Signed)
IV discharged. Patient having procedure done at [facility] due to being a difficult intubation. Dr Ardis Hughs discussed with patient

## 2020-01-17 NOTE — Anesthesia Preprocedure Evaluation (Addendum)
Anesthesia Evaluation  Patient identified by MRN, date of birth, ID band Patient awake    Reviewed: Allergy & Precautions, NPO status , Patient's Chart, lab work & pertinent test results  History of Anesthesia Complications (+) PONV, DIFFICULT AIRWAY and history of anesthetic complications  Airway Mallampati: II  TM Distance: >3 FB Neck ROM: Full    Dental  (+) Teeth Intact, Dental Advisory Given, Caps,    Pulmonary sleep apnea ,    Pulmonary exam normal breath sounds clear to auscultation       Cardiovascular hypertension, Pt. on medications + Peripheral Vascular Disease  + dysrhythmias Atrial Fibrillation  Rhythm:Irregular Rate:Abnormal     Neuro/Psych negative neurological ROS  negative psych ROS   GI/Hepatic Neg liver ROS, hiatal hernia, GERD  ,  Endo/Other  Obesity   Renal/GU negative Renal ROS     Musculoskeletal  (+) Arthritis ,   Abdominal   Peds  Hematology  (+) Blood dyscrasia (Eliquis), ,   Anesthesia Other Findings Day of surgery medications reviewed with the patient.  Reproductive/Obstetrics Uterine cancer                             Anesthesia Physical Anesthesia Plan  ASA: III  Anesthesia Plan: MAC   Post-op Pain Management:    Induction: Intravenous  PONV Risk Score and Plan: 3 and Midazolam, Dexamethasone and Ondansetron  Airway Management Planned: Nasal Cannula and Natural Airway  Additional Equipment:   Intra-op Plan:   Post-operative Plan:   Informed Consent: I have reviewed the patients History and Physical, chart, labs and discussed the procedure including the risks, benefits and alternatives for the proposed anesthesia with the patient or authorized representative who has indicated his/her understanding and acceptance.       Plan Discussed with: CRNA and Anesthesiologist  Anesthesia Plan Comments:         Anesthesia Quick  Evaluation

## 2020-01-17 NOTE — Anesthesia Postprocedure Evaluation (Signed)
Anesthesia Post Note  Patient: [name]  Procedure(s) Performed: COLONOSCOPY WITH PROPOFOL (N/A ) POLYPECTOMY     Patient location during evaluation: Endoscopy Anesthesia Type: MAC Level of consciousness: awake and alert Pain management: pain level controlled Vital Signs Assessment: post-procedure vital signs reviewed and stable Respiratory status: spontaneous breathing, nonlabored ventilation and respiratory function stable Cardiovascular status: stable and blood pressure returned to baseline Postop Assessment: no apparent nausea or vomiting Anesthetic complications: no   No complications documented.  Last Vitals:  Vitals:   01/17/20 1640 01/17/20 1650  BP: 133/68 130/64  Pulse: 61 63  Resp: 13 (!) 22  Temp:    SpO2: 98% 99%    Last Pain:  Vitals:   01/17/20 1650  TempSrc:   PainSc: 0-No pain                 Catalina Gravel

## 2020-01-17 NOTE — Op Note (Addendum)
[facility] Patient Name: [name] Procedure Date: 01/17/2020 MRN: [id number] Attending MD: Jackquline Denmark , MD Date of Birth: 1950-09-28 CSN: [id number] Age: 69 Admit Type: Outpatient Procedure:                Colonoscopy Indications:              High risk colon cancer surveillance: Personal                            history of colonic polyps. FH of colon cancer (dad                            at age>60) Providers:                Jackquline Denmark, MD, Grace Isaac, RN, Elspeth Cho                            Tech., Technician, Lesia Sago, Technician,                            Danley Danker, CRNA Referring MD:              Medicines:                Monitored Anesthesia Care Complications:            No immediate complications. Estimated Blood Loss:     Estimated blood loss: none. Procedure:                Pre-Anesthesia Assessment:                           - Prior to the procedure, a History and Physical                            was performed, and patient medications and                            allergies were reviewed. The patient's tolerance of                            previous anesthesia was also reviewed. The risks                            and benefits of the procedure and the sedation                            options and risks were discussed with the patient.                            All questions were answered, and informed consent                            was obtained. Prior Anticoagulants: The patient has                            taken Eliquis (apixaban),  last dose was 2 days                            prior to procedure. ASA Grade Assessment: II - A                            patient with mild systemic disease. After reviewing                            the risks and benefits, the patient was deemed in                            satisfactory condition to undergo the procedure.                           After obtaining informed  consent, the colonoscope                            was passed under direct vision. Throughout the                            procedure, the patient's blood pressure, pulse, and                            oxygen saturations were monitored continuously. The                            PCF-H190DL ([id number] Olympus pediatric colonscope                            was introduced through the anus and advanced to the                            2 cm into the ileum. The colonoscopy was performed                            without difficulty. The patient tolerated the                            procedure well. The quality of the bowel                            preparation was good. The terminal ileum, ileocecal                            valve, appendiceal orifice, and rectum were                            photographed. Scope In: 4:03:28 PM Scope Out: 4:23:21 PM Scope Withdrawal Time: 0 hours 14 minutes 11 seconds  Total Procedure Duration: 0 hours 19 minutes 53 seconds  Findings:      Two sessile polyps were found in the mid sigmoid colon (6 mm) and       proximal ascending colon (  1 cm). These polyps were removed with a cold       snare. The proximal ascending colon polyp was removed using a piecemeal       technique (in 2 pieces). Resection and retrieval were complete.      A few rare small-mouthed diverticula were found in the sigmoid colon.      Non-bleeding internal hemorrhoids were found during retroflexion. The       hemorrhoids were small.      The terminal ileum appeared normal.      The exam was otherwise without abnormality on direct and retroflexion       views. Impression:               -Colonic polyps s/p polypectomy.                           -Minimal sigmoid diverticulosis.                           -Otherwise normal colonoscopy to TI. Moderate Sedation:      Not Applicable - Patient had care per Anesthesia. Recommendation:           - Patient has a contact number available for                             emergencies. The signs and symptoms of potential                            delayed complications were discussed with the                            patient. Return to normal activities tomorrow.                            Written discharge instructions were provided to the                            patient.                           - Resume previous diet.                           - Resume Eliquis (apixaban) at prior dose in 48 Hrs                           - Await pathology results.                           - Repeat colonoscopy for surveillance based on                            pathology results.                           - The findings and recommendations were discussed  with the patient's family. Procedure Code(s):        --- Professional ---                           [phone], Colonoscopy, flexible; with removal of                            tumor(s), polyp(s), or other lesion(s) by snare                            technique Diagnosis Code(s):        --- Professional ---                           Z86.010, Personal history of colonic polyps                           K63.5, Polyp of colon                           K64.8, Other hemorrhoids                           K57.30, Diverticulosis of large intestine without                            perforation or abscess without bleeding CPT copyright 2019 [employer]. All rights reserved. The codes documented in this report are preliminary and upon coder review may  be revised to meet current compliance requirements. Jackquline Denmark, MD 01/17/2020 4:33:34 PM This report has been signed electronically. Number of Addenda: 0

## 2020-01-17 NOTE — Progress Notes (Signed)
Case canceled due to documented difficult airway.  Dr Ardis Hughs updated.

## 2020-01-17 NOTE — H&P (Signed)
For colon d/t H/O polyps and FH colon Ca (Dad) Last dose Eliquis 11/2 PM. Covid test was negative  Explained risks and benefits in detail.  He wishes to proceed.     previous note was reviewed.  CHIEF COMPLAINT:  Schedule a colonoscopy   HISTORY OF PRESENT ILLNESS:  [name] is a 69 year old female with a past medical history of atrial fibrilation on Eliquis, OSA uses Cpap, pneumonia 2019, melanoma, uterine cancer, colon polyps. S/P hiatal hernia repair and cholecystectomy 02/2019. S/P right hip replacement surgery in 2016.  She presents to our office today to schedule a colonoscopy.  Her most recent colonoscopy was 08/30/2016 which identified four tubular adenomatous polyps which were removed from the descending, transverse and ascending colon.  She was advised by Dr. Ardis Hughs to repeat a colonoscopy in 3 years.  She denies having any upper or lower abdominal pain.  She had constipation in the past for which she used Senokot as needed.  She passes a loose or solid stool daily.  No rectal bleeding or melena.  She intentionally lost 10 pounds over the past 5 to 6 weeks after she was diagnosed with obstructive sleep apnea.  She is using CPAP which she dislikes.  She hopes if she loses enough weight she can stop using CPAP.   Her father was diagnosed with colon cancer the age of 69.  No GERD symptoms.  She is on Pantoprazole 20 mg once daily.  History of atrial fibrillation on Eliquis.  She is status post cardiac ablation 08/09/2019.  She denies having any breakthrough atrial fibrillation since having the cardiac ablation completed.  No chest pain, palpitations, dizziness or shortness of breath. She is followed by cardiologist, Dr. Curt Bears. Flecainide dose was recently reduced to 50mg  bid. EF 66% per [facility] 08/25/2017.   She reports having a herniated disc in her cervical spine.  If she required intubation she reports her neck must maintain a neutral position.  She denies having any complication  with intubation with her past surgeries.   CBC Latest Ref Rng & Units 08/07/2019 03/13/2019 03/06/2019  WBC 3.4 - 10.8 x10E3/uL 7.1 7.5 6.3  Hemoglobin 11.1 - 15.9 g/dL 15.0 14.4 13.3  Hematocrit 34.0 - 46.6 % 44.2 43.7 40.2  Platelets 150 - 450 x10E3/uL 239 203 226   CMP Latest Ref Rng & Units 08/07/2019 03/13/2019 03/06/2019  Glucose 65 - 99 mg/dL 83 145(H) 98  BUN 8 - 27 mg/dL 23 12 23   Creatinine 0.57 - 1.00 mg/dL 0.78 0.77 0.78  Sodium 134 - 144 mmol/L 140 137 139  Potassium 3.5 - 5.2 mmol/L 4.8 4.9 4.1  Chloride 96 - 106 mmol/L 105 103 107  CO2 20 - 29 mmol/L 27 23 25   Calcium 8.7 - 10.3 mg/dL 9.8 9.1 9.1  Total Protein 6.5 - 8.1 g/dL - - 6.5  Total Bilirubin 0.3 - 1.2 mg/dL - - 0.6  Alkaline Phos 38 - 126 U/L - - 66  AST 15 - 41 U/L - - 22  ALT 0 - 44 U/L - - 21   Colonoscopy 08/30/2016 by Dr. Ardis Hughs: - Four 3 to 6 mm polyps in the descending colon, in the transverse colon and in the ascending   colon, removed with a cold snare. Resected and retrieved. - The examination was otherwise normal on direct and retroflexion views. - 3 year recall Surgical [P], descending, transverse and ascending-2, polyp (4) - TUBULAR ADENOMA (FIVE FRAGMENTS). - NO HIGH GRADE DYSPLASIA OR MALIGNANCY.  Past Medical History:  Diagnosis Date  . Chronic edema   . Constipation   . Difficult intubation    pt states that her neck needs to be in a neutral position,  scoliosisand herniated dics in neck  . Family history of colon cancer   . Family history of uterine cancer   . GERD (gastroesophageal reflux disease)    protonix, hx h. pylori  . Herniated disc, cervical   . History of hiatal hernia   . History of sebaceous adenoma   . History of uterine cancer 04/22/2014   sees Dr. Ronita Hipps; s/p complete hysterectomy  . History of uterine cancer   . Hx of colonic polyp   . 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 Melanoma [facility])    sees Dr. Renda Rolls in dermatology  right upper arm  . Obesity   . Paroxysmal atrial fibrillation (HCC)   . Peripheral vascular disease ([place])   . PONV (postoperative nausea and vomiting)   . Recurrent UTI   . S/P hip replacement   . Scoliosis   . Thyroid nodule   . Urinary incontinence   . Uterine cancer (HCC)    Stage 1  . Venous insufficiency    s/p ablation        Past Surgical History:  Procedure Laterality Date  . ABDOMINAL HYSTERECTOMY  09/10/2013  . ATRIAL FIBRILLATION ABLATION N/A 08/09/2019   Procedure: ATRIAL FIBRILLATION ABLATION;  Surgeon: Constance Haw, MD;  Location: [place] [facility];  Service: Cardiovascular;  Laterality: N/A;  . BUNIONECTOMY  10/1976  . CHOLECYSTECTOMY N/A 03/12/2019   Procedure: XI ROBOTIC ASSISTED CHOLECYSTECTOMY;  Surgeon: Clovis Riley, MD;  [facility];  Service: General;  Laterality: N/A;  . COLONOSCOPY    . FRACTURE SURGERY  09/1964   floor of orbit right side  . JOINT REPLACEMENT     bilateral hip replacements  . MELANOMA EXCISION  12/2011  . TOTAL HIP ARTHROPLASTY Left 06/17/2014   Procedure: LEFT TOTAL HIP ARTHROPLASTY ANTERIOR APPROACH;  Surgeon: Mcarthur Rossetti, MD;  Location: [place];  Service: Orthopedics;  Laterality: Left;  . TOTAL HIP ARTHROPLASTY Right 09/02/2014   Procedure: RIGHT TOTAL HIP ARTHROPLASTY ANTERIOR APPROACH;  Surgeon: Mcarthur Rossetti, MD;  Location: [place];  Service: Orthopedics;  Laterality: Right;  . VEIN SURGERY  05/2011   venous ablation    Social History: She is divorced.  She has 2 daughters.  She is a retired Forensic psychologist.  Non-smoker.  No alcohol.  No drug use.  Family History: family history includes Arthritis (age of onset: 20) in her sister; Cancer in her maternal grandmother and paternal uncle; Cancer (age of onset: 67) in her father; Diabetes (age of onset: 10) in her brother; Epilepsy (age of onset: 50) in her father; Hyperlipidemia in her father; Hypertension (age of onset: 87) in her  father; Other in her paternal grandmother; Skin cancer in her daughter; Stroke in her paternal grandfather; Uterine cancer in her mother.         Allergies  Allergen Reactions  . Hydrolyzed Silk Hives and Other (See Comments)    Silk tape-blisters  . Ciprofloxacin     Reactions with antiarrythmic  . Gluten Meal Other (See Comments)    reflux  . Metoprolol     dizzy          Outpatient Encounter Medications as of 11/21/2019  Medication Sig  . acetaminophen (TYLENOL) 500 MG tablet Take 2 tablets (1,000 mg total) by mouth every  6 (six) hours as needed.  . APPLE CIDER VINEGAR PO Take 1,500 mg by mouth 2 (two) times daily.   . Cranberry 500 MG TABS Take 1,000 mg by mouth 2 (two) times daily.  . cyclobenzaprine (FLEXERIL) 5 MG tablet Take 1 tablet (5 mg total) by mouth 3 (three) times daily as needed for muscle spasms.  Marland Kitchen diltiazem (CARDIZEM) 60 MG tablet Take 60 mg daily if needed for palpitations (Patient taking differently: Take 60 mg by mouth as needed (Palpitation for afib). )  . diltiazem (TIAZAC) 240 MG 24 hr capsule TAKE 1 CAPSULE BY MOUTH EVERY DAY  . ELIQUIS 5 MG TABS tablet TAKE 1 TABLET(5 MG) BY MOUTH TWICE DAILY  . flecainide (TAMBOCOR) 50 MG tablet Take 1 tablet (50 mg total) by mouth 2 (two) times daily.  . methenamine (HIPREX) 1 g tablet Take 1 g by mouth 2 (two) times daily.  Marland Kitchen MYRBETRIQ 25 MG TB24 tablet Take 25 mg by mouth every other day.  Marland Kitchen OVER THE COUNTER MEDICATION Take 20 mg by mouth daily as needed (arthritis). CBD oil  . pantoprazole (PROTONIX) 20 MG tablet Take 20 mg by mouth daily.  . polyethylene glycol (MIRALAX / GLYCOLAX) packet Take 17 g by mouth daily. (Patient taking differently: Take 17 g by mouth as needed. )  . senna-docusate (SENOKOT-S) 8.6-50 MG tablet Take 1 tablet by mouth at bedtime. (Patient taking differently: Take 1 tablet by mouth at bedtime. )  . spironolactone (ALDACTONE) 50 MG tablet TAKE 2 TABLETS BY MOUTH EVERY MORNING AND 1  TABLET EVERY EVENING (Patient taking differently: Take 50-100 mg by mouth See admin instructions. Take 100 mg by mouth in the morning and 50 mg in the evening)  . traMADol (ULTRAM) 50 MG tablet Take 1 tablet (50 mg total) by mouth every 6 (six) hours as needed (mild pain).  . triamcinolone cream (KENALOG) 0.1 % Apply 1 application topically daily as needed (for dry skin).   . vitamin C (ASCORBIC ACID) 500 MG tablet Take 500 mg by mouth daily.    No facility-administered encounter medications on file as of 11/21/2019.     REVIEW OF SYSTEMS:   Gen: Denies fever, sweats or chills.  CV: Denies chest pain, palpitations or edema. See HPI. Resp: Denies cough, shortness of breath of hemoptysis.  GI: See HPI.  GU : + urinary leaking. Denies urinary burning, blood in urine or increased urinary frequency.  MS: + back pain.  Derm: Denies rash, itchiness, skin lesions or unhealing ulcers. Psych: Denies depression, anxiety or memory loss.  Heme: Denies bruising, bleeding. Neuro:  Denies headaches, dizziness or paresthesias. Endo:  Denies any problems with DM, thyroid or adrenal function.    PHYSICAL EXAM: BP 108/64 (BP Location: Left Arm, Patient Position: Sitting, Cuff Size: Normal)   Pulse 68   Ht 5' 8.5" (1.74 m) Comment: height measured without shoes  Wt 230 lb 4 oz (104.4 kg)   BMI 34.50 kg/m   General: Well developed 69 year old female in no acute distress. Head: Normocephalic and atraumatic. Eyes:  Sclerae non-icteric, conjunctive pink. Ears: Normal auditory acuity. Mouth: Dentition intact. No ulcers or lesions.  Neck: Supple, no lymphadenopathy or thyromegaly.  Lungs: Clear bilaterally to auscultation without wheezes, crackles or rhonchi. Heart: Regular rate and rhythm. No murmur, rub or gallop appreciated.  Abdomen: Soft, nontender, non distended. No masses. No hepatosplenomegaly.  Ecchymotic patch 5cm left and above the umbilicus, small knot palpated without gross  hematoma.  Normoactive bowel sounds  x 4 quadrants.  Rectal: Deferred.  Musculoskeletal: Symmetrical with no gross deformities. Skin: Warm and dry. No rash or lesions on visible extremities. Extremities: No edema. Neurological: Alert oriented x 4, no focal deficits.  Psychological:  Alert and cooperative. Normal mood and affect.  ASSESSMENT AND PLAN:  34. 69 year old female with a past medical history of colon polyps. Family history of colon cancer (father) -Colonoscopy benefits and risks discussed including risk with sedation, risk of bleeding, perforation and infection  -Our office will contact the patient's cardiologist to verify Eliquis instructions prior to proceeding with a colonoscopy  2. Atrial fibrillation on Eliquis CHA2DS2VAScof 3.status post AF ablation 08/09/2019  3. GERD, stable on Pantoprazole 20mg  QD  4. OSA on Cpap

## 2020-01-17 NOTE — Transfer of Care (Signed)
Immediate Anesthesia Transfer of Care Note  Patient: TERRIANN DIFONZO  Procedure(s) Performed: COLONOSCOPY WITH PROPOFOL (N/A ) POLYPECTOMY  Patient Location: PACU  Anesthesia Type:MAC  Level of Consciousness: sedated, patient cooperative and responds to stimulation  Airway & Oxygen Therapy: Patient Spontanous Breathing and Patient connected to face mask oxygen  Post-op Assessment: Report given to RN and Post -op Vital signs reviewed and stable  Post vital signs: Reviewed and stable  Last Vitals:  Vitals Value Taken Time  BP    Temp    Pulse 63 01/17/20 1630  Resp 18 01/17/20 1630  SpO2 100 % 01/17/20 1630  Vitals shown include unvalidated device data.  Last Pain:  Vitals:   01/17/20 1550  TempSrc: Temporal  PainSc: 0-No pain         Complications: No complications documented.

## 2020-01-17 NOTE — Discharge Instructions (Signed)

## 2020-01-20 DIAGNOSIS — G4733 Obstructive sleep apnea (adult) (pediatric): Secondary | ICD-10-CM | POA: Diagnosis not present

## 2020-01-21 ENCOUNTER — Encounter (HOSPITAL_COMMUNITY): Payer: Self-pay | Admitting: Gastroenterology

## 2020-01-21 LAB — SURGICAL PATHOLOGY

## 2020-01-22 DIAGNOSIS — G4733 Obstructive sleep apnea (adult) (pediatric): Secondary | ICD-10-CM | POA: Diagnosis not present

## 2020-01-23 ENCOUNTER — Other Ambulatory Visit: Payer: Self-pay | Admitting: Otolaryngology

## 2020-01-26 ENCOUNTER — Encounter: Payer: Self-pay | Admitting: Gastroenterology

## 2020-01-27 DIAGNOSIS — N3020 Other chronic cystitis without hematuria: Secondary | ICD-10-CM | POA: Diagnosis not present

## 2020-01-27 DIAGNOSIS — N3281 Overactive bladder: Secondary | ICD-10-CM | POA: Diagnosis not present

## 2020-02-19 ENCOUNTER — Other Ambulatory Visit: Payer: Self-pay | Admitting: Cardiology

## 2020-02-19 DIAGNOSIS — I4891 Unspecified atrial fibrillation: Secondary | ICD-10-CM

## 2020-02-19 NOTE — Telephone Encounter (Signed)
Prescription refill request for Eliquis received. hx: Afib Last office visit: [place], 11/14/2019 Scr: 0.78, 08/07/2019 Age: 69 Weight: 104.2 kg   Prescription refill sent.

## 2020-02-21 DIAGNOSIS — G4733 Obstructive sleep apnea (adult) (pediatric): Secondary | ICD-10-CM | POA: Diagnosis not present

## 2020-02-25 ENCOUNTER — Encounter: Payer: Self-pay | Admitting: Cardiology

## 2020-02-25 ENCOUNTER — Other Ambulatory Visit: Payer: Self-pay

## 2020-02-25 ENCOUNTER — Ambulatory Visit: Payer: PPO | Admitting: Cardiology

## 2020-02-25 VITALS — BP 102/60 | HR 82 | Ht 69.5 in | Wt 231.4 lb

## 2020-02-25 DIAGNOSIS — I480 Paroxysmal atrial fibrillation: Secondary | ICD-10-CM

## 2020-02-25 NOTE — Progress Notes (Signed)
Patient's chart reviewed with Dr Elgie Congo, aware of difficut airway due to herniated cervical disc. OK for [facility].

## 2020-02-25 NOTE — Progress Notes (Signed)
Electrophysiology Office Note   Date:  02/25/2020   ID:  [name], DOB [birth year], MRN [id number]  PCP:  Patient, No Pcp Per  Cardiologist: [place] Primary Electrophysiologist:  Dr Curt Bears    CC: Evaluation for atrial fibrillation   History of Present Illness: [name] is a 69 y.o. female who is being seen today for the evaluation of atrial fibrillation at the request of Lucretia Kern, DO. Presenting today for electrophysiology evaluation.    She has a history significant for atrial fibrillation.  She was initially diagnosed in 2012 and controlled with diltiazem.  She has been in December 2019 with multifocal pneumonia and acute atrial fibrillation with rapid rates.  Heart rates were in the 180s despite flecainide and Cardizem.  She did convert to sinus rhythm.  She was initially planned for AF ablation but was found to have a large hiatal hernia and is now status post.  She is status post AF ablation 08/09/2019.  Today, denies symptoms of palpitations, chest pain, shortness of breath, orthopnea, PND, lower extremity edema, claudication, dizziness, presyncope, syncope, bleeding, or neurologic sequela. The patient is tolerating medications without difficulties.  She has not noted any further episodes of atrial fibrillation.  She is currently felt well and is able to do all of her daily activities.  She is currently working on getting her sleep apnea under better control.   Past Medical History:  Diagnosis Date  . Chronic edema   . Clotting disorder ([place])   . Constipation   . Difficult intubation    pt states that her neck needs to be in a neutral position,  scoliosisand herniated dics in neck  . Family history of colon cancer   . Family history of uterine cancer   . Gallstones   . GERD (gastroesophageal reflux disease)    protonix, hx h. pylori  . Herniated disc, cervical   . History of hiatal hernia   . History of sebaceous adenoma   . History of uterine cancer 04/22/2014    sees Dr. Ronita Hipps; s/p complete hysterectomy  . Hx of colonic polyp   . 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 Melanoma [facility])    sees Dr. Renda Rolls in dermatology right upper arm  . Obesity   . OSA on CPAP   . Paroxysmal atrial fibrillation (HCC)   . Peripheral vascular disease ([place])   . PONV (postoperative nausea and vomiting)   . Recurrent UTI   . S/P hip replacement   . Scoliosis   . Sleep apnea   . Thyroid nodule   . Urinary incontinence   . Uterine cancer (HCC)    Stage 1  . Venous insufficiency    s/p ablation   Past Surgical History:  Procedure Laterality Date  . ABDOMINAL HYSTERECTOMY  09/10/2013  . ATRIAL FIBRILLATION ABLATION N/A 08/09/2019   Procedure: ATRIAL FIBRILLATION ABLATION;  Surgeon: Constance Haw, MD;  Location: [place] [facility];  Service: Cardiovascular;  Laterality: N/A;  . BUNIONECTOMY  10/1976  . CHOLECYSTECTOMY N/A 03/12/2019   Procedure: XI ROBOTIC ASSISTED CHOLECYSTECTOMY;  Surgeon: Clovis Riley, MD;  [facility];  Service: General;  Laterality: N/A;  . COLONOSCOPY    . COLONOSCOPY WITH PROPOFOL N/A 01/17/2020   Procedure: COLONOSCOPY WITH PROPOFOL;  Surgeon: Jackquline Denmark, MD;  [facility];  Service: Endoscopy;  Laterality: N/A;  . FRACTURE SURGERY  09/1964   floor of orbit right side  . HIATAL HERNIA REPAIR    .  JOINT REPLACEMENT     bilateral hip replacements  . MELANOMA EXCISION  12/2011  . POLYPECTOMY  01/17/2020   Procedure: POLYPECTOMY;  Surgeon: Jackquline Denmark, MD;  [facility];  Service: Endoscopy;;  . TOTAL HIP ARTHROPLASTY Left 06/17/2014   Procedure: LEFT TOTAL HIP ARTHROPLASTY ANTERIOR APPROACH;  Surgeon: Mcarthur Rossetti, MD;  Location: [place];  Service: Orthopedics;  Laterality: Left;  . TOTAL HIP ARTHROPLASTY Right 09/02/2014   Procedure: RIGHT TOTAL HIP ARTHROPLASTY ANTERIOR APPROACH;  Surgeon: Mcarthur Rossetti, MD;  Location: [place];  Service: Orthopedics;  Laterality: Right;  .  VEIN SURGERY  05/2011   venous ablation      Current Outpatient Medications  Medication Sig Dispense Refill  . acetaminophen (TYLENOL) 500 MG tablet Take 2 tablets (1,000 mg total) by mouth every 6 (six) hours as needed.  0  . Cranberry 500 MG TABS Take 1,000 mg by mouth 2 (two) times daily.    Marland Kitchen diltiazem (CARDIZEM) 60 MG tablet Take 60 mg daily if needed for palpitations (Patient taking differently: Take 60 mg by mouth as needed (Palpitation for afib).) 30 tablet 9  . diltiazem (TIAZAC) 240 MG 24 hr capsule TAKE 1 CAPSULE BY MOUTH EVERY DAY 90 capsule 3  . ELIQUIS 5 MG TABS tablet TAKE 1 TABLET(5 MG) BY MOUTH TWICE DAILY 180 tablet 1  . MYRBETRIQ 25 MG TB24 tablet Take 25 mg by mouth daily.    Marland Kitchen OVER THE COUNTER MEDICATION Take 20 mg by mouth daily as needed (arthritis). CBD oil    . polyethylene glycol (MIRALAX / GLYCOLAX) 17 g packet Take 17 g by mouth as needed.    . sennosides-docusate sodium (SENOKOT-S) 8.6-50 MG tablet Take 1 tablet by mouth as needed for constipation.    Marland Kitchen spironolactone (ALDACTONE) 50 MG tablet TAKE 2 TABLETS BY MOUTH EVERY MORNING AND 1 TABLET EVERY EVENING (Patient taking differently: Take 50-100 mg by mouth See admin instructions. Take 100 mg by mouth in the morning and 50 mg in the evening) 270 tablet 3  . triamcinolone cream (KENALOG) 0.1 % Apply 1 application topically daily as needed (for dry skin).   1  . vitamin C (ASCORBIC ACID) 500 MG tablet Take 500 mg by mouth daily.      Current Facility-Administered Medications  Medication Dose Route Frequency Provider Last Rate Last Admin  . 0.9 %  sodium chloride infusion  500 mL Intravenous Once Milus Banister, MD        Allergies:   Hydrolyzed silk, Ciprofloxacin, Gluten meal, and Metoprolol   Social History:  The patient  reports that she has never smoked. She has never used smokeless tobacco. She reports current alcohol use. She reports that she does not use drugs.   Family History:  The patient's family  history includes Arthritis (age of onset: 51) in her sister; Cancer in her maternal grandmother and paternal uncle; Colon cancer (age of onset: 68) in her father; Diabetes (age of onset: 54) in her brother; Emphysema in her mother; Epilepsy (age of onset: 68) in her father; Hyperlipidemia in her father; Hypertension (age of onset: 14) in her father; Leukemia in her father; Other in her paternal grandmother; Skin cancer in her daughter; Stroke in her paternal grandfather; Uterine cancer in her mother.   ROS:  Please see the history of present illness.   Otherwise, review of systems is positive for none.   All other systems are reviewed and negative.   PHYSICAL EXAM: VS:  BP  102/60   Pulse 82   Ht 5' 9.5" (1.765 m)   Wt 231 lb 6.4 oz (105 kg)   SpO2 97%   BMI 33.68 kg/m  , BMI Body mass index is 33.68 kg/m. GEN: Well nourished, well developed, in no acute distress  HEENT: normal  Neck: no JVD, carotid bruits, or masses Cardiac: RRR; no murmurs, rubs, or gallops,no edema  Respiratory:  clear to auscultation bilaterally, normal work of breathing GI: soft, nontender, nondistended, + BS MS: no deformity or atrophy  Skin: warm and dry Neuro:  Strength and sensation are intact Psych: euthymic mood, full affect  EKG:  EKG is ordered today. Personal review of the ekg ordered shows sinus rhythm, rate 78  Recent Labs: 03/06/2019: ALT 21 03/13/2019: Magnesium 2.0 08/07/2019: BUN 23; Creatinine, Ser 0.78; Hemoglobin 15.0; Platelets 239; Potassium 4.8; Sodium 140    Lipid Panel     Component Value Date/Time   CHOL 180 03/20/2018 1117   TRIG 53.0 09/07/2015 1211   HDL 45.20 03/20/2018 1117   CHOLHDL 3 09/07/2015 1211   VLDL 10.6 09/07/2015 1211   LDLCALC 87 09/07/2015 1211     Wt Readings from Last 3 Encounters:  02/25/20 231 lb 6.4 oz (105 kg)  01/17/20 230 lb (104.3 kg)  12/31/19 225 lb (102.1 kg)      Other studies Reviewed: Additional studies/ records that were reviewed today  include: TTE 08/25/16  Review of the above records today demonstrates:  - Left ventricle: The cavity size was normal. Wall thickness was   increased in a pattern of mild LVH. Doppler parameters are   consistent with abnormal left ventricular relaxation (grade 1   diastolic dysfunction). - Left atrium:  The atrium was normal in size.   Myoview 08/25/17  Nuclear stress EF: 66%.  There was no ST segment deviation noted during stress.  The study is normal.  This is a low risk study.  The left ventricular ejection fraction is hyperdynamic (>65%).   Breast attenuation no ischemia or infarct EF 66%  ASSESSMENT AND PLAN:  1.  Paroxysmal atrial fibrillation/typical atrial flutter: Currently on Eliquis, flecainide, diltiazem.  Monitoring for high risk medication.  CHA2DS2-VASc of 3.  Is status post AF ablation 08/09/2019.  Remains in sinus rhythm.  No changes.  2.  Obstructive sleep apnea: Found to be severe.  She is on CPAP which is shown improvement in her fatigue.   Current medicines are reviewed at length with the patient today.   The patient does not have concerns regarding her medicines.  The following changes were made today: None  Labs/ tests ordered today include:  Orders Placed This Encounter  Procedures  . EKG 12-Lead     Disposition: Follow-up with Deval Mroczka 6 months  Signed, Sydne Krahl Meredith Leeds, MD  02/25/2020 1:50 PM     [address] [place]  [place] [address] [phone] (office) [phone] (fax)

## 2020-02-25 NOTE — Patient Instructions (Signed)
Medication Instructions:  Your physician has recommended you make the following change in your medication:  -- STOP Flecainide  Labwork: None  Testing/Procedures: None  Follow-Up: Your physician recommends that you schedule a follow-up appointment in: 6 MONTHS with Dr. Curt Bears,   Any Other Special Instructions Will Be Listed Below (If Applicable).  If you need a refill on your cardiac medications before your next appointment, please call your pharmacy.

## 2020-02-26 ENCOUNTER — Encounter (HOSPITAL_BASED_OUTPATIENT_CLINIC_OR_DEPARTMENT_OTHER): Payer: Self-pay

## 2020-02-28 ENCOUNTER — Other Ambulatory Visit: Payer: Self-pay

## 2020-02-28 ENCOUNTER — Encounter (HOSPITAL_BASED_OUTPATIENT_CLINIC_OR_DEPARTMENT_OTHER): Payer: Self-pay | Admitting: Otolaryngology

## 2020-02-29 ENCOUNTER — Other Ambulatory Visit (HOSPITAL_COMMUNITY)
Admission: RE | Admit: 2020-02-29 | Discharge: 2020-02-29 | Disposition: A | Discharge status: Home / Self Care | Payer: PPO | Source: Ambulatory Visit | Attending: Otolaryngology | Admitting: Otolaryngology

## 2020-02-29 DIAGNOSIS — Z01812 Encounter for preprocedural laboratory examination: Secondary | ICD-10-CM | POA: Diagnosis not present

## 2020-02-29 DIAGNOSIS — Z20822 Contact with and (suspected) exposure to covid-19: Secondary | ICD-10-CM | POA: Diagnosis not present

## 2020-02-29 LAB — SARS CORONAVIRUS 2 (TAT 6-24 HRS): SARS Coronavirus 2: NEGATIVE

## 2020-03-04 ENCOUNTER — Ambulatory Visit (HOSPITAL_COMMUNITY)
Admission: RE | Admit: 2020-03-04 | Discharge: 2020-03-04 | Disposition: A | Discharge status: Home / Self Care | Payer: PPO | Attending: Otolaryngology | Admitting: Otolaryngology

## 2020-03-04 ENCOUNTER — Ambulatory Visit (HOSPITAL_BASED_OUTPATIENT_CLINIC_OR_DEPARTMENT_OTHER): Payer: PPO | Admitting: Anesthesiology

## 2020-03-04 ENCOUNTER — Other Ambulatory Visit: Payer: Self-pay

## 2020-03-04 ENCOUNTER — Encounter (HOSPITAL_BASED_OUTPATIENT_CLINIC_OR_DEPARTMENT_OTHER): Payer: Self-pay | Admitting: Otolaryngology

## 2020-03-04 ENCOUNTER — Encounter (HOSPITAL_BASED_OUTPATIENT_CLINIC_OR_DEPARTMENT_OTHER)
Admission: RE | Disposition: A | Discharge status: Home / Self Care | Payer: Self-pay | Source: Home / Self Care | Attending: Otolaryngology

## 2020-03-04 DIAGNOSIS — Z881 Allergy status to other antibiotic agents status: Secondary | ICD-10-CM | POA: Insufficient documentation

## 2020-03-04 DIAGNOSIS — Z96643 Presence of artificial hip joint, bilateral: Secondary | ICD-10-CM | POA: Diagnosis not present

## 2020-03-04 DIAGNOSIS — Z809 Family history of malignant neoplasm, unspecified: Secondary | ICD-10-CM | POA: Insufficient documentation

## 2020-03-04 DIAGNOSIS — G4730 Sleep apnea, unspecified: Secondary | ICD-10-CM | POA: Diagnosis not present

## 2020-03-04 DIAGNOSIS — M160 Bilateral primary osteoarthritis of hip: Secondary | ICD-10-CM | POA: Diagnosis not present

## 2020-03-04 DIAGNOSIS — Z888 Allergy status to other drugs, medicaments and biological substances status: Secondary | ICD-10-CM | POA: Insufficient documentation

## 2020-03-04 DIAGNOSIS — Z7901 Long term (current) use of anticoagulants: Secondary | ICD-10-CM | POA: Diagnosis not present

## 2020-03-04 DIAGNOSIS — N3281 Overactive bladder: Secondary | ICD-10-CM | POA: Diagnosis not present

## 2020-03-04 DIAGNOSIS — Z79899 Other long term (current) drug therapy: Secondary | ICD-10-CM | POA: Diagnosis not present

## 2020-03-04 DIAGNOSIS — Z8542 Personal history of malignant neoplasm of other parts of uterus: Secondary | ICD-10-CM | POA: Diagnosis not present

## 2020-03-04 DIAGNOSIS — I10 Essential (primary) hypertension: Secondary | ICD-10-CM | POA: Diagnosis not present

## 2020-03-04 DIAGNOSIS — G4733 Obstructive sleep apnea (adult) (pediatric): Secondary | ICD-10-CM | POA: Diagnosis not present

## 2020-03-04 DIAGNOSIS — Z85820 Personal history of malignant melanoma of skin: Secondary | ICD-10-CM | POA: Insufficient documentation

## 2020-03-04 HISTORY — PX: DRUG INDUCED ENDOSCOPY: SHX6808

## 2020-03-04 LAB — BASIC METABOLIC PANEL
Anion gap: 9 (ref 5–15)
BUN: 16 mg/dL (ref 8–23)
CO2: 23 mmol/L (ref 22–32)
Calcium: 9.5 mg/dL (ref 8.9–10.3)
Chloride: 105 mmol/L (ref 98–111)
Creatinine, Ser: 0.72 mg/dL (ref 0.44–1.00)
GFR, Estimated: >60 mL/min (ref >60)
Glucose, Bld: 90 mg/dL (ref 70–99)
Potassium: 4.3 mmol/L (ref 3.5–5.1)
Sodium: 137 mmol/L (ref 135–145)

## 2020-03-04 SURGERY — DRUG INDUCED SLEEP ENDOSCOPY
Anesthesia: Monitor Anesthesia Care | Site: Nose

## 2020-03-04 MED ORDER — PROPOFOL 500 MG/50ML IV EMUL
INTRAVENOUS | Status: DC | PRN
  Administered 2020-03-04: 150 ug/kg/min via INTRAVENOUS

## 2020-03-04 MED ORDER — LACTATED RINGERS IV SOLN: INTRAVENOUS | Status: DC

## 2020-03-04 MED ORDER — OXYMETAZOLINE HCL 0.05 % NA SOLN
NASAL | Status: DC | PRN
  Administered 2020-03-04: 1 via TOPICAL

## 2020-03-04 MED ORDER — LIDOCAINE 2% (20 MG/ML) 5 ML SYRINGE
INTRAMUSCULAR | Status: DC | PRN
  Administered 2020-03-04: 60 mg via INTRAVENOUS

## 2020-03-04 SURGICAL SUPPLY — 16 items
CANISTER SUCT 1200ML W/VALVE (MISCELLANEOUS) ×3 IMPLANT
COVER WAND RF STERILE (DRAPES) IMPLANT
GLOVE BIO SURGEON STRL SZ 6.5 (GLOVE) ×2 IMPLANT
GLOVE BIO SURGEON STRL SZ7.5 (GLOVE) ×3 IMPLANT
GLOVE BIO SURGEONS STRL SZ 6.5 (GLOVE) ×1
KIT CLEAN ENDO (MISCELLANEOUS) ×3 IMPLANT
NEEDLE PRECISIONGLIDE 27X1.5 (NEEDLE) IMPLANT
PACK BASIN DAY SURGERY FS (CUSTOM PROCEDURE TRAY) ×3 IMPLANT
PATTIES SURGICAL .5 X3 (DISPOSABLE) ×3 IMPLANT
SHEET MEDIUM DRAPE 40X70 STRL (DRAPES) ×3 IMPLANT
SOL ANTI FOG 6CC (MISCELLANEOUS) ×1 IMPLANT
SOLUTION ANTI FOG 6CC (MISCELLANEOUS) ×2
SYR CONTROL 10ML LL (SYRINGE) IMPLANT
TOWEL GREEN STERILE FF (TOWEL DISPOSABLE) ×3 IMPLANT
TUBE CONNECTING 20'X1/4 (TUBING)
TUBE CONNECTING 20X1/4 (TUBING) IMPLANT

## 2020-03-04 NOTE — Transfer of Care (Signed)
Immediate Anesthesia Transfer of Care Note  Patient: [name]  Procedure(s) Performed: DRUG INDUCED ENDOSCOPY (N/A Nose)  Patient Location: PACU  Anesthesia Type:MAC  Level of Consciousness: awake, alert , oriented and patient cooperative  Airway & Oxygen Therapy: Patient Spontanous Breathing and Patient connected to face mask oxygen  Post-op Assessment: Report given to RN, Post -op Vital signs reviewed and stable and Patient moving all extremities  Post vital signs: Reviewed and stable  Last Vitals:  Vitals Value Taken Time  BP 118/63 03/04/20 1027  Temp 36.6 C 03/04/20 1027  Pulse 72 03/04/20 1028  Resp 17 03/04/20 1028  SpO2 94 % 03/04/20 1028  Vitals shown include unvalidated device data.  Last Pain:  Vitals:   03/04/20 0859  TempSrc: Oral         Complications: No complications documented.

## 2020-03-04 NOTE — Op Note (Signed)
Preop diagnosis: Obstructive sleep apnea Postop diagnosis: same Procedure: Drug-induced sleep endoscopy Surgeon: Redmond Baseman Anesth: IV sedation Compl: None Findings: There is complete anterior-posterior collapse at the velum making her a candidate for hypoglossal nerve stimulator placement.  There was also complete anterior-posterior collapse at the tongue base. Description:  After discussing risks, benefits, and alternatives, the patient was brought to the operative suite and placed on the operative table in the supine position.  Anesthesia was induced and the patient was given light sedation to simulate natural sleep. When the proper level was reached, an Afrin-soaked pledget was placed in the right nasal passage for a couple of minutes and then removed.  The fiberoptic laryngoscope was then passed to view the pharynx and larynx.  Findings are noted above and the exam was recorded.  After completion, the scope was removed and the patient was returned to anesthesia for wakeup and was moved to the recovery room in stable condition.

## 2020-03-04 NOTE — Anesthesia Postprocedure Evaluation (Addendum)
Anesthesia Post Note  Patient: [name]  Procedure(s) Performed: DRUG INDUCED ENDOSCOPY (N/A Nose)     Patient location during evaluation: PACU Anesthesia Type: MAC Level of consciousness: awake and alert and oriented Pain management: pain level controlled Vital Signs Assessment: post-procedure vital signs reviewed and stable Respiratory status: spontaneous breathing, nonlabored ventilation and respiratory function stable Cardiovascular status: blood pressure returned to baseline and stable Postop Assessment: no apparent nausea or vomiting Anesthetic complications: no   No complications documented.  Last Vitals:  Vitals:   03/04/20 1030 03/04/20 1059  BP: 108/65 128/90  Pulse: 70 69  Resp: 12 16  Temp:  36.6 C  SpO2: 98% 96%    Last Pain:  Vitals:   03/04/20 1059  TempSrc: Oral  PainSc: 0-No pain                 Adriell Polansky A.

## 2020-03-04 NOTE — Discharge Instructions (Signed)

## 2020-03-04 NOTE — H&P (Signed)
[name] is an 69 y.o. female.   Chief Complaint: Sleep apnea HPI: 69 year old female with obstructive sleep apnea who is having difficulty tolerating CPAP.  She presents for sleep endoscopy.  Past Medical History:  Diagnosis Date  . Chronic edema   . Clotting disorder ([place])   . Constipation   . Difficult intubation    pt states that her neck needs to be in a neutral position,  scoliosisand herniated dics in neck  . Family history of colon cancer   . Family history of uterine cancer   . Gallstones   . GERD (gastroesophageal reflux disease)    protonix, hx h. pylori  . Herniated disc, cervical   . History of hiatal hernia   . History of sebaceous adenoma   . History of uterine cancer 04/22/2014   sees Dr. Ronita Hipps; s/p complete hysterectomy  . Hx of colonic polyp   . 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 Melanoma [facility])    sees Dr. Renda Rolls in dermatology right upper arm  . Obesity   . OSA on CPAP    uses CPAP  . Paroxysmal atrial fibrillation (H[facility])   . Peripheral vascular disease ([place])   . PONV (postoperative nausea and vomiting)   . Recurrent UTI   . S/P hip replacement   . Scoliosis   . Sleep apnea   . Thyroid nodule   . Urinary incontinence   . Uterine cancer (H[facility])    Stage 1  . Venous insufficiency    s/p ablation    Past Surgical History:  Procedure Laterality Date  . ABDOMINAL HYSTERECTOMY  09/10/2013  . ATRIAL FIBRILLATION ABLATION N/A 08/09/2019   Procedure: ATRIAL FIBRILLATION ABLATION;  Surgeon: Constance Haw, MD;  Location: [facility];  Service: Cardiovascular;  Laterality: N/A;  . BUNIONECTOMY  10/1976  . CHOLECYSTECTOMY N/A 03/12/2019   Procedure: XI ROBOTIC ASSISTED CHOLECYSTECTOMY;  Surgeon: Clovis Riley, MD;  Location: [facility];  Service: General;  Laterality: N/A;  . COLONOSCOPY    . COLONOSCOPY WITH PROPOFOL N/A 01/17/2020   Procedure: COLONOSCOPY WITH PROPOFOL;  Surgeon: Jackquline Denmark, MD;  Location: [facility];   Service: Endoscopy;  Laterality: N/A;  . FRACTURE SURGERY  09/1964   floor of orbit right side  . HIATAL HERNIA REPAIR    . JOINT REPLACEMENT     bilateral hip replacements  . MELANOMA EXCISION  12/2011  . POLYPECTOMY  01/17/2020   Procedure: POLYPECTOMY;  Surgeon: Jackquline Denmark, MD;  Location: [facility];  Service: Endoscopy;;  . TOTAL HIP ARTHROPLASTY Left 06/17/2014   Procedure: LEFT TOTAL HIP ARTHROPLASTY ANTERIOR APPROACH;  Surgeon: Mcarthur Rossetti, MD;  Location: [facility];  Service: Orthopedics;  Laterality: Left;  . TOTAL HIP ARTHROPLASTY Right 09/02/2014   Procedure: RIGHT TOTAL HIP ARTHROPLASTY ANTERIOR APPROACH;  Surgeon: Mcarthur Rossetti, MD;  Location: [facility];  Service: Orthopedics;  Laterality: Right;  . VEIN SURGERY  05/2011   venous ablation     Family History  Problem Relation Age of Onset  . Uterine cancer Mother   . Emphysema Mother   . Diabetes Brother 56  . Hypertension Father 10  . Hyperlipidemia Father   . Epilepsy Father 67  . Colon cancer Father 78  . Leukemia Father   . Arthritis Sister 106  . Cancer Maternal Grandmother        unk type possibly breast or skin  . Other Paternal Grandmother        influenza   .  Stroke Paternal Grandfather   . Cancer Paternal Uncle        unk type  . Skin cancer Daughter        SCC on back  . Esophageal cancer Neg Hx   . Rectal cancer Neg Hx   . Stomach cancer Neg Hx    Social History:  reports that she has never smoked. She has never used smokeless tobacco. She reports current alcohol use. She reports that she does not use drugs.  Allergies:  Allergies  Allergen Reactions  . Hydrolyzed Silk Hives and Other (See Comments)    Silk tape-blisters  . Ciprofloxacin     Reactions with antiarrythmic  . Gluten Meal Other (See Comments)    reflux  . Metoprolol     dizzy    Facility-Administered Medications Prior to Admission  Medication Dose Route Frequency Provider Last Rate Last Admin  . 0.9 %  sodium  chloride infusion  500 mL Intravenous Once Rachael FeeJacobs, Daniel P, MD       Medications Prior to Admission  Medication Sig Dispense Refill  . acetaminophen (TYLENOL) 500 MG tablet Take 2 tablets (1,000 mg total) by mouth every 6 (six) hours as needed.  0  . Cranberry 500 MG TABS Take 1,000 mg by mouth 2 (two) times daily.    Marland Kitchen. diltiazem (TIAZAC) 240 MG 24 hr capsule TAKE 1 CAPSULE BY MOUTH EVERY DAY 90 capsule 3  . MYRBETRIQ 25 MG TB24 tablet Take 25 mg by mouth daily.    Marland Kitchen. OVER THE COUNTER MEDICATION Take 20 mg by mouth daily as needed (arthritis). CBD oil    . polyethylene glycol (MIRALAX / GLYCOLAX) 17 g packet Take 17 g by mouth as needed.    . sennosides-docusate sodium (SENOKOT-S) 8.6-50 MG tablet Take 1 tablet by mouth as needed for constipation.    Marland Kitchen. spironolactone (ALDACTONE) 50 MG tablet TAKE 2 TABLETS BY MOUTH EVERY MORNING AND 1 TABLET EVERY EVENING (Patient taking differently: Take 50-100 mg by mouth See admin instructions. Take 100 mg by mouth in the morning and 50 mg in the evening) 270 tablet 3  . triamcinolone cream (KENALOG) 0.1 % Apply 1 application topically daily as needed (for dry skin).   1  . vitamin C (ASCORBIC ACID) 500 MG tablet Take 500 mg by mouth daily.     Marland Kitchen. diltiazem (CARDIZEM) 60 MG tablet Take 60 mg daily if needed for palpitations (Patient taking differently: Take 60 mg by mouth as needed (Palpitation for afib).) 30 tablet 9  . ELIQUIS 5 MG TABS tablet TAKE 1 TABLET(5 MG) BY MOUTH TWICE DAILY 180 tablet 1    Results for orders placed or performed during the hospital encounter of 03/04/20 (from the past 48 hour(s))  Basic metabolic panel     Status: None   Collection Time: 03/04/20  9:10 AM  Result Value Ref Range   Sodium 137 135 - 145 mmol/L   Potassium 4.3 3.5 - 5.1 mmol/L   Chloride 105 98 - 111 mmol/L   CO2 23 22 - 32 mmol/L   Glucose, Bld 90 70 - 99 mg/dL    Comment: Glucose reference range applies only to samples taken after fasting for at least 8 hours.    BUN 16 8 - 23 mg/dL   Creatinine, Ser 1.610.72 0.44 - 1.00 mg/dL   Calcium 9.5 8.9 - 09.610.3 mg/dL   GFR, Estimated >04>60 >54>60 mL/min    Comment: (NOTE) Calculated using the CKD-EPI Creatinine Equation (2021)  Anion gap 9 5 - 15    Comment: Performed at [facility] [address] [place], [place] [address]   No results found.  Review of Systems  All other systems reviewed and are negative.   Blood pressure 122/79, pulse 78, temperature 98.3 F (36.8 C), temperature source Oral, resp. rate 18, height 5\' 9"  (1.753 m), weight 106.9 kg, SpO2 97 %. Physical Exam Constitutional:      Appearance: Normal appearance. She is normal weight.  HENT:     Head: Normocephalic and atraumatic.     Right Ear: External ear normal.     Left Ear: External ear normal.     Nose: Nose normal.     Mouth/Throat:     Mouth: Mucous membranes are moist.     Pharynx: Oropharynx is clear.  Eyes:     Extraocular Movements: Extraocular movements intact.     Conjunctiva/sclera: Conjunctivae normal.     Pupils: Pupils are equal, round, and reactive to light.  Cardiovascular:     Rate and Rhythm: Normal rate.  Pulmonary:     Effort: Pulmonary effort is normal.  Musculoskeletal:     Cervical back: Normal range of motion.  Skin:    General: Skin is warm and dry.  Neurological:     General: No focal deficit present.     Mental Status: She is alert and oriented to person, place, and time.  Psychiatric:        Mood and Affect: Mood normal.        Behavior: Behavior normal.        Thought Content: Thought content normal.        Judgment: Judgment normal.      Assessment/Plan Obstructive sleep apnea  To OR for DISE.  Melida Quitter, MD 03/04/2020, 9:40 AM

## 2020-03-04 NOTE — Brief Op Note (Signed)
03/04/2020  10:25 AM  PATIENT:  [name]  69 y.o. female  PRE-OPERATIVE DIAGNOSIS:  sleep apnea  POST-OPERATIVE DIAGNOSIS:  sleep apnea  PROCEDURE:  Procedure(s): DRUG INDUCED ENDOSCOPY (N/A)  SURGEON:  Surgeon(s) and Role:    Melida Quitter, MD - Primary  PHYSICIAN ASSISTANT:   ASSISTANTS: none   ANESTHESIA:   IV sedation  EBL:  None  BLOOD ADMINISTERED:none  DRAINS: none   LOCAL MEDICATIONS USED:  NONE  SPECIMEN:  No Specimen  DISPOSITION OF SPECIMEN:  N/A  COUNTS:  YES  TOURNIQUET:  * No tourniquets in log *  DICTATION: .Note written in EPIC  PLAN OF CARE: Discharge to home after PACU  PATIENT DISPOSITION:  PACU - hemodynamically stable.   Delay start of Pharmacological VTE agent (>24hrs) due to surgical blood loss or risk of bleeding: no

## 2020-03-04 NOTE — Anesthesia Preprocedure Evaluation (Addendum)
Anesthesia Evaluation  Patient identified by MRN, date of birth, ID band Patient awake    Reviewed: Allergy & Precautions, NPO status , Patient's Chart, lab work & pertinent test results  History of Anesthesia Complications (+) PONV, DIFFICULT AIRWAY and history of anesthetic complications  Airway Mallampati: II  TM Distance: >3 FB Neck ROM: Full    Dental no notable dental hx. (+) Teeth Intact   Pulmonary sleep apnea , pneumonia, resolved,    Pulmonary exam normal breath sounds clear to auscultation       Cardiovascular + Peripheral Vascular Disease  Normal cardiovascular exam+ dysrhythmias Atrial Fibrillation  Rhythm:Regular Rate:Normal  EKG 02/25/20 NSR, normal   Neuro/Psych    GI/Hepatic Neg liver ROS, hiatal hernia, GERD  Medicated and Controlled,  Endo/Other  Obesity  Renal/GU negative Renal ROS  negative genitourinary   Musculoskeletal  (+) Arthritis , Osteoarthritis,    Abdominal (+) + obese,   Peds  Hematology Eliquis therapy- last dose 12/19   Anesthesia Other Findings   Reproductive/Obstetrics                             Anesthesia Physical Anesthesia Plan  ASA: III  Anesthesia Plan: MAC   Post-op Pain Management:    Induction: Intravenous  PONV Risk Score and Plan: 4 or greater and Ondansetron and Treatment may vary due to age or medical condition  Airway Management Planned: Mask and Natural Airway  Additional Equipment:   Intra-op Plan:   Post-operative Plan:   Informed Consent: I have reviewed the patients History and Physical, chart, labs and discussed the procedure including the risks, benefits and alternatives for the proposed anesthesia with the patient or authorized representative who has indicated his/her understanding and acceptance.     Dental advisory given  Plan Discussed with: CRNA and Anesthesiologist  Anesthesia Plan Comments:         Anesthesia Quick Evaluation

## 2020-03-04 NOTE — Addendum Note (Signed)
Addendum  created 03/04/20 1130 by Josephine Igo, MD   Clinical Note Signed, SmartForm saved

## 2020-03-04 NOTE — Addendum Note (Signed)
Addendum  created 03/04/20 1131 by Josephine Igo, MD   Clinical Note Signed

## 2020-03-05 ENCOUNTER — Encounter (HOSPITAL_BASED_OUTPATIENT_CLINIC_OR_DEPARTMENT_OTHER): Payer: Self-pay | Admitting: Otolaryngology

## 2020-03-05 NOTE — Addendum Note (Signed)
Addendum  created 03/05/20 8403 by Lavonia Dana, CRNA   Charge Capture section accepted

## 2020-03-11 DIAGNOSIS — G4733 Obstructive sleep apnea (adult) (pediatric): Secondary | ICD-10-CM | POA: Diagnosis not present

## 2020-03-12 ENCOUNTER — Encounter (HOSPITAL_BASED_OUTPATIENT_CLINIC_OR_DEPARTMENT_OTHER): Payer: Self-pay

## 2020-03-17 ENCOUNTER — Ambulatory Visit: Payer: PPO | Attending: Radiation Oncology

## 2020-03-17 ENCOUNTER — Ambulatory Visit: Payer: PPO | Admitting: Radiation Oncology

## 2020-03-23 DIAGNOSIS — G4733 Obstructive sleep apnea (adult) (pediatric): Secondary | ICD-10-CM | POA: Diagnosis not present

## 2020-03-24 ENCOUNTER — Other Ambulatory Visit: Payer: Self-pay | Admitting: Internal Medicine

## 2020-03-31 ENCOUNTER — Encounter (HOSPITAL_BASED_OUTPATIENT_CLINIC_OR_DEPARTMENT_OTHER): Payer: PPO | Admitting: Cardiology

## 2020-04-08 ENCOUNTER — Other Ambulatory Visit: Payer: Self-pay

## 2020-04-08 ENCOUNTER — Encounter (HOSPITAL_BASED_OUTPATIENT_CLINIC_OR_DEPARTMENT_OTHER): Payer: Self-pay | Admitting: Otolaryngology

## 2020-04-08 NOTE — Progress Notes (Signed)
Almyra Free at Dr Redmond Baseman office notified of need for Eliquis orders for upcoming surgery.

## 2020-04-10 ENCOUNTER — Telehealth: Payer: Self-pay | Admitting: *Deleted

## 2020-04-10 NOTE — Telephone Encounter (Signed)
Left message for the patient to call back and speak to the on-call preop APP of the day 

## 2020-04-10 NOTE — Telephone Encounter (Signed)
Clinical pharmacist to review Eliquis 

## 2020-04-10 NOTE — Telephone Encounter (Signed)
Patient with diagnosis of A Fib on Eliquis for anticoagulation.    1. Procedure: IMPLANTATION OF HYPOGLOSSAL NERVE STIMULATOR    Date of procedure: 04/15/20   CHA2DS2-VASc Score = 3  This indicates a 3.2% annual risk of stroke. The patient's score is based upon: CHF History: No HTN History: Yes Diabetes History: No Stroke History: No Vascular Disease History: No Age Score: 1 Gender Score: 1     CrCl 51mL/min using adjusted body weight Platelet count 239K    Per office protocol, patient can hold Eliquis  for 2 days prior to procedure.

## 2020-04-10 NOTE — Telephone Encounter (Signed)
Dr. Redmond Baseman office called back and confirmed surgery to be done as well will be using General anesthesia . I will send updated notes to pre op.

## 2020-04-10 NOTE — Telephone Encounter (Signed)
Incoming fax this morning for clearance request was incomplete as well as DOB for pt did not match. I called and left message for the requesting office this morning and asked for a new fax with correct DOB to verify correct pt as well as needing complete information as to needing procedure to be done, type of anesthesia to be used.   I did receive a new fax this afternoon though only had the correct DOB so that I was able to confirm correct pt. I left message again for surgery scheduler to please send a new fax with complete information, or she can call the office [phone] and ask for me Arbie Cookey and give me the information by phone. Pt surgery is scheduled for 04/15/20. Pt is also on Eliquis.   I reviewed procedure notes in Epic and was able to gather some of the information needed for the cardiologist.  I submit clearance request with the information I was able to locate on Epic as to not delay pt's surgery.     [facility] Pre-operative Risk Assessment    HEARTCARE STAFF: - Please ensure there is not already an duplicate clearance open for this procedure. - Under Visit Info/Reason for Call, type in Other and utilize the format Clearance MM/DD/YY or Clearance TBD. Do not use dashes or single digits. - If request is for dental extraction, please clarify the # of teeth to be extracted.  Request for surgical clearance:  1. What type of surgery is being performed? IMPLANTATION OF HYPOGLOSSAL NERVE STIMULATOR    2. When is this surgery scheduled? 04/15/20   3. What type of clearance is required (medical clearance vs. Pharmacy clearance to hold med vs. Both)? BOTH  4. Are there any medications that need to be held prior to surgery and how long? ELIQUIS  5. Practice name and name of physician performing surgery? [facility]; DR. Redmond Baseman   6. What is the office phone number? [phone]   7.   What is the office fax number? [phone]  8.   Anesthesia type (None, local,  MAC, general) ? NOT LISTED   Julaine Hua 04/10/2020, 2:22 PM  _________________________________________________________________   (provider comments below)

## 2020-04-11 ENCOUNTER — Other Ambulatory Visit (HOSPITAL_COMMUNITY)
Admission: RE | Admit: 2020-04-11 | Discharge: 2020-04-11 | Disposition: A | Discharge status: Home / Self Care | Payer: PPO | Source: Ambulatory Visit | Attending: Otolaryngology | Admitting: Otolaryngology

## 2020-04-11 DIAGNOSIS — Z20822 Contact with and (suspected) exposure to covid-19: Secondary | ICD-10-CM | POA: Diagnosis not present

## 2020-04-11 DIAGNOSIS — Z01812 Encounter for preprocedural laboratory examination: Secondary | ICD-10-CM | POA: Diagnosis not present

## 2020-04-11 LAB — SARS CORONAVIRUS 2 (TAT 6-24 HRS): SARS Coronavirus 2: NEGATIVE

## 2020-04-13 NOTE — Telephone Encounter (Signed)
   Primary Cardiologist: Pixie Casino, MD  Chart reviewed and patient contacted by phone today as part of pre-operative protocol coverage. Given past medical history and time since last visit, based on ACC/AHA guidelines, [name] would be at acceptable risk for the planned procedure without further cardiovascular testing.   OK to hold Eliquis two days pre op and resume as soon as safe post op.  I spoke with the patient today and she understands these instructions.  The patient was advised that if she develops new symptoms prior to surgery to contact our office to arrange for a follow-up visit, and she verbalized understanding.  I will route this recommendation to the requesting party via Epic fax function and remove from pre-op pool.  Please call with questions.  Kerin Ransom, PA-C 04/13/2020, 9:56 AM

## 2020-04-13 NOTE — Telephone Encounter (Signed)
Follow Up:       Pt is returning a call from [place] on Friday.

## 2020-04-14 NOTE — Anesthesia Preprocedure Evaluation (Signed)
Anesthesia Evaluation  Patient identified by MRN, date of birth, ID band Patient awake    Reviewed: Allergy & Precautions, NPO status , Patient's Chart, lab work & pertinent test results  History of Anesthesia Complications (+) PONV, DIFFICULT AIRWAY and history of anesthetic complications  Airway Mallampati: II  TM Distance: >3 FB Neck ROM: Full    Dental no notable dental hx.    Pulmonary sleep apnea and Continuous Positive Airway Pressure Ventilation ,    Pulmonary exam normal        Cardiovascular hypertension, Pt. on medications + Peripheral Vascular Disease  Normal cardiovascular exam+ dysrhythmias Atrial Fibrillation      Neuro/Psych negative neurological ROS  negative psych ROS   GI/Hepatic Neg liver ROS, hiatal hernia, GERD  ,  Endo/Other  negative endocrine ROS  Renal/GU negative Renal ROS  negative genitourinary   Musculoskeletal  (+) Arthritis ,   Abdominal   Peds  Hematology negative hematology ROS (+)   Anesthesia Other Findings Day of surgery medications reviewed with patient.  Reproductive/Obstetrics negative OB ROS                            Anesthesia Physical Anesthesia Plan  ASA: III  Anesthesia Plan: General   Post-op Pain Management:    Induction: Intravenous  PONV Risk Score and Plan: 4 or greater and Treatment may vary due to age or medical condition, Ondansetron, Dexamethasone, Midazolam and Propofol infusion  Airway Management Planned: Oral ETT and Video Laryngoscope Planned  Additional Equipment: None  Intra-op Plan:   Post-operative Plan: Extubation in OR  Informed Consent: I have reviewed the patients History and Physical, chart, labs and discussed the procedure including the risks, benefits and alternatives for the proposed anesthesia with the patient or authorized representative who has indicated his/her understanding and acceptance.      Dental advisory given  Plan Discussed with: CRNA  Anesthesia Plan Comments:        Anesthesia Quick Evaluation

## 2020-04-15 ENCOUNTER — Ambulatory Visit (HOSPITAL_COMMUNITY): Payer: PPO

## 2020-04-15 ENCOUNTER — Encounter (HOSPITAL_BASED_OUTPATIENT_CLINIC_OR_DEPARTMENT_OTHER): Payer: Self-pay | Admitting: Otolaryngology

## 2020-04-15 ENCOUNTER — Encounter (HOSPITAL_BASED_OUTPATIENT_CLINIC_OR_DEPARTMENT_OTHER)
Admission: RE | Disposition: A | Discharge status: Home / Self Care | Payer: Self-pay | Source: Home / Self Care | Attending: Otolaryngology

## 2020-04-15 ENCOUNTER — Ambulatory Visit (HOSPITAL_BASED_OUTPATIENT_CLINIC_OR_DEPARTMENT_OTHER)
Admission: RE | Admit: 2020-04-15 | Discharge: 2020-04-15 | Disposition: A | Discharge status: Home / Self Care | Payer: PPO | Attending: Otolaryngology | Admitting: Otolaryngology

## 2020-04-15 ENCOUNTER — Ambulatory Visit (HOSPITAL_BASED_OUTPATIENT_CLINIC_OR_DEPARTMENT_OTHER): Payer: PPO | Admitting: Certified Registered"

## 2020-04-15 ENCOUNTER — Other Ambulatory Visit: Payer: Self-pay

## 2020-04-15 DIAGNOSIS — Z7901 Long term (current) use of anticoagulants: Secondary | ICD-10-CM | POA: Diagnosis not present

## 2020-04-15 DIAGNOSIS — Z79899 Other long term (current) drug therapy: Secondary | ICD-10-CM | POA: Insufficient documentation

## 2020-04-15 DIAGNOSIS — Z9689 Presence of other specified functional implants: Secondary | ICD-10-CM | POA: Diagnosis not present

## 2020-04-15 DIAGNOSIS — Z96643 Presence of artificial hip joint, bilateral: Secondary | ICD-10-CM | POA: Diagnosis not present

## 2020-04-15 DIAGNOSIS — Z6834 Body mass index (BMI) 34.0-34.9, adult: Secondary | ICD-10-CM | POA: Diagnosis not present

## 2020-04-15 DIAGNOSIS — Z969 Presence of functional implant, unspecified: Secondary | ICD-10-CM | POA: Diagnosis not present

## 2020-04-15 DIAGNOSIS — K449 Diaphragmatic hernia without obstruction or gangrene: Secondary | ICD-10-CM | POA: Diagnosis not present

## 2020-04-15 DIAGNOSIS — M160 Bilateral primary osteoarthritis of hip: Secondary | ICD-10-CM | POA: Diagnosis not present

## 2020-04-15 DIAGNOSIS — Z8542 Personal history of malignant neoplasm of other parts of uterus: Secondary | ICD-10-CM | POA: Insufficient documentation

## 2020-04-15 DIAGNOSIS — G4733 Obstructive sleep apnea (adult) (pediatric): Secondary | ICD-10-CM | POA: Insufficient documentation

## 2020-04-15 DIAGNOSIS — I480 Paroxysmal atrial fibrillation: Secondary | ICD-10-CM | POA: Diagnosis not present

## 2020-04-15 DIAGNOSIS — Z85820 Personal history of malignant melanoma of skin: Secondary | ICD-10-CM | POA: Diagnosis not present

## 2020-04-15 HISTORY — PX: IMPLANTATION OF HYPOGLOSSAL NERVE STIMULATOR: SHX6827

## 2020-04-15 LAB — BASIC METABOLIC PANEL
Anion gap: 12 (ref 5–15)
BUN: 17 mg/dL (ref 8–23)
CO2: 24 mmol/L (ref 22–32)
Calcium: 9.3 mg/dL (ref 8.9–10.3)
Chloride: 103 mmol/L (ref 98–111)
Creatinine, Ser: 0.76 mg/dL (ref 0.44–1.00)
GFR, Estimated: >60 mL/min (ref >60)
Glucose, Bld: 87 mg/dL (ref 70–99)
Potassium: 3.9 mmol/L (ref 3.5–5.1)
Sodium: 139 mmol/L (ref 135–145)

## 2020-04-15 SURGERY — INSERTION, HYPOGLOSSAL NERVE STIMULATOR
Anesthesia: General | Site: Chest | Laterality: Right

## 2020-04-15 MED ORDER — LIDOCAINE 2% (20 MG/ML) 5 ML SYRINGE
INTRAMUSCULAR | Status: DC | PRN
  Administered 2020-04-15: 100 mg via INTRAVENOUS

## 2020-04-15 MED ORDER — OXYCODONE HCL 5 MG PO TABS: 5.0000 mg | ORAL_TABLET | Freq: Once | ORAL | Status: DC | PRN

## 2020-04-15 MED ORDER — ACETAMINOPHEN 500 MG PO TABS
ORAL_TABLET | ORAL | Status: AC
  Filled 2020-04-15: qty 2

## 2020-04-15 MED ORDER — FENTANYL CITRATE (PF) 100 MCG/2ML IJ SOLN
INTRAMUSCULAR | Status: AC
  Filled 2020-04-15: qty 2

## 2020-04-15 MED ORDER — LIDOCAINE-EPINEPHRINE 1 %-1:100000 IJ SOLN
INTRAMUSCULAR | Status: DC | PRN
  Administered 2020-04-15: 5 mL

## 2020-04-15 MED ORDER — PROPOFOL 500 MG/50ML IV EMUL
INTRAVENOUS | Status: AC
  Filled 2020-04-15: qty 150

## 2020-04-15 MED ORDER — SUCCINYLCHOLINE CHLORIDE 20 MG/ML IJ SOLN
INTRAMUSCULAR | Status: DC | PRN
  Administered 2020-04-15: 140 mg via INTRAVENOUS

## 2020-04-15 MED ORDER — OXYCODONE HCL 5 MG/5ML PO SOLN: 5.0000 mg | Freq: Once | ORAL | Status: DC | PRN

## 2020-04-15 MED ORDER — ONDANSETRON HCL 4 MG/2ML IJ SOLN
INTRAMUSCULAR | Status: DC | PRN
  Administered 2020-04-15: 4 mg via INTRAVENOUS

## 2020-04-15 MED ORDER — SUCCINYLCHOLINE CHLORIDE 200 MG/10ML IV SOSY
PREFILLED_SYRINGE | INTRAVENOUS | Status: AC
  Filled 2020-04-15: qty 10

## 2020-04-15 MED ORDER — LIDOCAINE 2% (20 MG/ML) 5 ML SYRINGE
INTRAMUSCULAR | Status: AC
  Filled 2020-04-15: qty 10

## 2020-04-15 MED ORDER — PROPOFOL 10 MG/ML IV BOLUS
INTRAVENOUS | Status: DC | PRN
  Administered 2020-04-15: 170 mg via INTRAVENOUS

## 2020-04-15 MED ORDER — HYDROCODONE-ACETAMINOPHEN 5-325 MG PO TABS
1.0000 | ORAL_TABLET | Freq: Four times a day (QID) | ORAL | 0 refills | Status: DC | PRN
Start: 2020-04-15 — End: 2020-05-13

## 2020-04-15 MED ORDER — DEXAMETHASONE SODIUM PHOSPHATE 10 MG/ML IJ SOLN
INTRAMUSCULAR | Status: AC
  Filled 2020-04-15: qty 1

## 2020-04-15 MED ORDER — PHENYLEPHRINE HCL (PRESSORS) 10 MG/ML IV SOLN
INTRAVENOUS | Status: AC
  Filled 2020-04-15: qty 1

## 2020-04-15 MED ORDER — ONDANSETRON HCL 4 MG/2ML IJ SOLN
INTRAMUSCULAR | Status: AC
  Filled 2020-04-15: qty 6

## 2020-04-15 MED ORDER — FENTANYL CITRATE (PF) 100 MCG/2ML IJ SOLN
INTRAMUSCULAR | Status: DC | PRN
  Administered 2020-04-15: 25 ug via INTRAVENOUS
  Administered 2020-04-15: 100 ug via INTRAVENOUS
  Administered 2020-04-15: 25 ug via INTRAVENOUS

## 2020-04-15 MED ORDER — PROMETHAZINE HCL 25 MG/ML IJ SOLN: 6.2500 mg | INTRAMUSCULAR | Status: DC | PRN

## 2020-04-15 MED ORDER — DEXAMETHASONE SODIUM PHOSPHATE 4 MG/ML IJ SOLN
INTRAMUSCULAR | Status: DC | PRN
  Administered 2020-04-15: 4 mg via INTRAVENOUS

## 2020-04-15 MED ORDER — ACETAMINOPHEN 500 MG PO TABS
ORAL_TABLET | ORAL | Status: AC
  Filled 2020-04-15: qty 1

## 2020-04-15 MED ORDER — LACTATED RINGERS IV SOLN: INTRAVENOUS | Status: DC

## 2020-04-15 MED ORDER — FENTANYL CITRATE (PF) 100 MCG/2ML IJ SOLN: 25.0000 ug | INTRAMUSCULAR | Status: DC | PRN

## 2020-04-15 MED ORDER — 0.9 % SODIUM CHLORIDE (POUR BTL) OPTIME
TOPICAL | Status: DC | PRN
  Administered 2020-04-15: 120 mL

## 2020-04-15 MED ORDER — CEFAZOLIN SODIUM-DEXTROSE 2-3 GM-%(50ML) IV SOLR
INTRAVENOUS | Status: DC | PRN
  Administered 2020-04-15: 2 g via INTRAVENOUS

## 2020-04-15 MED ORDER — PROPOFOL 500 MG/50ML IV EMUL
INTRAVENOUS | Status: DC | PRN
  Administered 2020-04-15: 25 ug/kg/min via INTRAVENOUS

## 2020-04-15 MED ORDER — LIDOCAINE-EPINEPHRINE 1 %-1:100000 IJ SOLN
INTRAMUSCULAR | Status: AC
  Filled 2020-04-15: qty 1

## 2020-04-15 MED ORDER — ACETAMINOPHEN 500 MG PO TABS
1000.0000 mg | ORAL_TABLET | Freq: Once | ORAL | Status: AC
  Administered 2020-04-15: 1000 mg via ORAL

## 2020-04-15 SURGICAL SUPPLY — 69 items
BAG DECANTER FOR FLEXI CONT (MISCELLANEOUS) ×2 IMPLANT
BLADE CLIPPER SURG (BLADE) IMPLANT
BLADE SURG 15 STRL LF DISP TIS (BLADE) ×1 IMPLANT
BLADE SURG 15 STRL SS (BLADE) ×1
CANISTER SUCT 1200ML W/VALVE (MISCELLANEOUS) ×2 IMPLANT
CORD BIPOLAR FORCEPS 12FT (ELECTRODE) ×2 IMPLANT
COVER PROBE W GEL 5X96 (DRAPES) ×2 IMPLANT
COVER WAND RF STERILE (DRAPES) ×2 IMPLANT
DERMABOND ADVANCED (GAUZE/BANDAGES/DRESSINGS) ×2
DERMABOND ADVANCED .7 DNX12 (GAUZE/BANDAGES/DRESSINGS) ×2 IMPLANT
DRAPE C-ARM 35X43 STRL (DRAPES) ×2 IMPLANT
DRAPE HEAD BAR (DRAPES) IMPLANT
DRAPE INCISE IOBAN 66X45 STRL (DRAPES) ×2 IMPLANT
DRAPE MICROSCOPE WILD 40.5X102 (DRAPES) ×2 IMPLANT
DRAPE UTILITY XL STRL (DRAPES) ×2 IMPLANT
DRSG TEGADERM 2-3/8X2-3/4 SM (GAUZE/BANDAGES/DRESSINGS) IMPLANT
DRSG TEGADERM 4X4.75 (GAUZE/BANDAGES/DRESSINGS) IMPLANT
ELECT COATED BLADE 2.86 ST (ELECTRODE) ×2 IMPLANT
ELECT EMG 18 NIMS (NEUROSURGERY SUPPLIES) ×2
ELECT REM PT RETURN 9FT ADLT (ELECTROSURGICAL) ×2
ELECTRODE EMG 18 NIMS (NEUROSURGERY SUPPLIES) ×1 IMPLANT
ELECTRODE REM PT RTRN 9FT ADLT (ELECTROSURGICAL) ×1 IMPLANT
FORCEPS BIPOLAR SPETZLER 8 1.0 (NEUROSURGERY SUPPLIES) ×2 IMPLANT
GAUZE 4X4 16PLY RFD (DISPOSABLE) ×2 IMPLANT
GAUZE SPONGE 4X4 12PLY STRL (GAUZE/BANDAGES/DRESSINGS) ×2 IMPLANT
GENERATOR PULSE INSPIRE (Generator) ×2 IMPLANT
GLOVE BIO SURGEON STRL SZ7.5 (GLOVE) ×2 IMPLANT
GLOVE SURG ENC MOIS LTX SZ6.5 (GLOVE) ×2 IMPLANT
GLOVE SURG SS PI 7.0 STRL IVOR (GLOVE) ×2 IMPLANT
GLOVE SURG UNDER POLY LF SZ7 (GLOVE) ×4 IMPLANT
GOWN STRL REUS W/ TWL LRG LVL3 (GOWN DISPOSABLE) ×3 IMPLANT
GOWN STRL REUS W/TWL LRG LVL3 (GOWN DISPOSABLE) ×3
IV CATH 18G SAFETY (IV SOLUTION) ×2 IMPLANT
KIT NEURO ACCESSORY W/WRENCH (MISCELLANEOUS) IMPLANT
LEAD SENSING RESP INSPIRE (Lead) ×2 IMPLANT
LEAD SLEEP STIMULATION INSPIRE (Lead) ×2 IMPLANT
LOOP VESSEL MAXI BLUE (MISCELLANEOUS) ×2 IMPLANT
LOOP VESSEL MINI RED (MISCELLANEOUS) ×2 IMPLANT
MARKER SKIN DUAL TIP RULER LAB (MISCELLANEOUS) ×2 IMPLANT
NEEDLE HYPO 25X1 1.5 SAFETY (NEEDLE) ×2 IMPLANT
NS IRRIG 1000ML POUR BTL (IV SOLUTION) ×2 IMPLANT
PACK BASIN DAY SURGERY FS (CUSTOM PROCEDURE TRAY) ×2 IMPLANT
PACK ENT DAY SURGERY (CUSTOM PROCEDURE TRAY) ×2 IMPLANT
PASSER CATH 36 CODMAN DISP (NEUROSURGERY SUPPLIES) ×2 IMPLANT
PASSER CATH 38CM DISP (INSTRUMENTS) IMPLANT
PENCIL SMOKE EVACUATOR (MISCELLANEOUS) ×2 IMPLANT
PROBE NERVE STIMULATOR (NEUROSURGERY SUPPLIES) ×2 IMPLANT
REMOTE CONTROL SLEEP INSPIRE (MISCELLANEOUS) ×2 IMPLANT
SET WALTER ACTIVATION W/DRAPE (SET/KITS/TRAYS/PACK) ×2 IMPLANT
SLEEVE SCD COMPRESS KNEE MED (MISCELLANEOUS) ×2 IMPLANT
SLING ARM FOAM STRAP LRG (SOFTGOODS) IMPLANT
SLING ARM FOAM STRAP MED (SOFTGOODS) IMPLANT
SPONGE INTESTINAL PEANUT (DISPOSABLE) ×4 IMPLANT
STAPLER VISISTAT 35W (STAPLE) IMPLANT
SUT ETHILON 2 0 FS 18 (SUTURE) ×4 IMPLANT
SUT ETHILON 3 0 PS 1 (SUTURE) ×6 IMPLANT
SUT SILK 2 0 SH (SUTURE) IMPLANT
SUT SILK 3 0 REEL (SUTURE) IMPLANT
SUT SILK 3 0 SH 30 (SUTURE) IMPLANT
SUT SILK 3-0 (SUTURE)
SUT SILK 3-0 RB1 30XBRD (SUTURE)
SUT VIC AB 3-0 SH 27 (SUTURE) ×2
SUT VIC AB 3-0 SH 27X BRD (SUTURE) ×2 IMPLANT
SUT VIC AB 4-0 PS2 27 (SUTURE) ×4 IMPLANT
SUTURE SILK 3-0 RB1 30XBRD (SUTURE) IMPLANT
SYR 10ML LL (SYRINGE) ×2 IMPLANT
SYR BULB EAR ULCER 3OZ GRN STR (SYRINGE) ×2 IMPLANT
SYR CONTROL 10ML LL (SYRINGE) ×2 IMPLANT
TOWEL GREEN STERILE FF (TOWEL DISPOSABLE) ×4 IMPLANT

## 2020-04-15 NOTE — Transfer of Care (Signed)
Immediate Anesthesia Transfer of Care Note  Patient: [name]  Procedure(s) Performed: IMPLANTATION OF HYPOGLOSSAL NERVE STIMULATOR (Right Chest)  Patient Location: PACU  Anesthesia Type:General  Level of Consciousness: awake, alert , oriented and patient cooperative  Airway & Oxygen Therapy: Patient Spontanous Breathing and Patient connected to face mask oxygen  Post-op Assessment: Report given to RN and Post -op Vital signs reviewed and stable  Post vital signs: Reviewed and stable  Last Vitals:  Vitals Value Taken Time  BP 129/66 04/15/20 1130  Temp    Pulse 77 04/15/20 1130  Resp 16 04/15/20 1130  SpO2 96 % 04/15/20 1130  Vitals shown include unvalidated device data.  Last Pain:  Vitals:   04/15/20 0802  TempSrc: Oral  PainSc: 0-No pain      Patients Stated Pain Goal: 0 (84/16/60 6301)  Complications: No complications documented.

## 2020-04-15 NOTE — Brief Op Note (Signed)
04/15/2020  11:31 AM  PATIENT:  [name]  70 y.o. female  PRE-OPERATIVE DIAGNOSIS:  severe sleep apnea  POST-OPERATIVE DIAGNOSIS:  severe sleep apnea  PROCEDURE:  Procedure(s): IMPLANTATION OF HYPOGLOSSAL NERVE STIMULATOR (Right)  SURGEON:  Surgeon(s) and Role:    Melida Quitter, MD - Primary  PHYSICIAN ASSISTANT: Sallee Provencal  ASSISTANTS: none   ANESTHESIA:   general  EBL: Minimal   BLOOD ADMINISTERED:none  DRAINS: none   LOCAL MEDICATIONS USED:  LIDOCAINE   SPECIMEN:  No Specimen  DISPOSITION OF SPECIMEN:  N/A  COUNTS:  YES  TOURNIQUET:  * No tourniquets in log *  DICTATION: .Note written in EPIC  PLAN OF CARE: Discharge to home after PACU  PATIENT DISPOSITION:  PACU - hemodynamically stable.   Delay start of Pharmacological VTE agent (>24hrs) due to surgical blood loss or risk of bleeding: no

## 2020-04-15 NOTE — H&P (Signed)
[name] is an 70 y.o. female.   Chief Complaint: Obstructive sleep apnea HPI: 70 year old female with obstructive sleep apnea who has not been able to tolerate CPAP well.  She presents for surgical management.  Past Medical History:  Diagnosis Date  . Chronic edema   . Clotting disorder ([place])   . Constipation   . Difficult intubation    pt states that her neck needs to be in a neutral position,  scoliosisand herniated dics in neck  . Family history of colon cancer   . Family history of uterine cancer   . Gallstones   . GERD (gastroesophageal reflux disease)    protonix, hx h. pylori  . Herniated disc, cervical   . History of hiatal hernia   . History of sebaceous adenoma   . History of uterine cancer 04/22/2014   sees Dr. Ronita Hipps; s/p complete hysterectomy  . Hx of colonic polyp   . 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 Melanoma [facility])    sees Dr. Renda Rolls in dermatology right upper arm  . Obesity   . OSA on CPAP    uses CPAP  . Paroxysmal atrial fibrillation (HCC)   . Peripheral vascular disease ([place])   . PONV (postoperative nausea and vomiting)   . Recurrent UTI   . S/P hip replacement   . Scoliosis   . Sleep apnea   . Thyroid nodule   . Urinary incontinence   . Uterine cancer (HCC)    Stage 1  . Venous insufficiency    s/p ablation    Past Surgical History:  Procedure Laterality Date  . ABDOMINAL HYSTERECTOMY  09/10/2013  . ATRIAL FIBRILLATION ABLATION N/A 08/09/2019   Procedure: ATRIAL FIBRILLATION ABLATION;  Surgeon: Constance Haw, MD;  Location: [place] [facility];  Service: Cardiovascular;  Laterality: N/A;  . BUNIONECTOMY  10/1976  . CHOLECYSTECTOMY N/A 03/12/2019   Procedure: XI ROBOTIC ASSISTED CHOLECYSTECTOMY;  Surgeon: Clovis Riley, MD;  Location: [facility];  Service: General;  Laterality: N/A;  . COLONOSCOPY    . COLONOSCOPY WITH PROPOFOL N/A 01/17/2020   Procedure: COLONOSCOPY WITH PROPOFOL;  Surgeon: Jackquline Denmark, MD;  Location:  [facility];  Service: Endoscopy;  Laterality: N/A;  . DRUG INDUCED ENDOSCOPY N/A 03/04/2020   Procedure: DRUG INDUCED ENDOSCOPY;  Surgeon: Melida Quitter, MD;  Location: [place];  Service: ENT;  Laterality: N/A;  . FRACTURE SURGERY  09/1964   floor of orbit right side  . HIATAL HERNIA REPAIR    . JOINT REPLACEMENT     bilateral hip replacements  . MELANOMA EXCISION  12/2011  . POLYPECTOMY  01/17/2020   Procedure: POLYPECTOMY;  Surgeon: Jackquline Denmark, MD;  Location: [facility];  Service: Endoscopy;;  . TOTAL HIP ARTHROPLASTY Left 06/17/2014   Procedure: LEFT TOTAL HIP ARTHROPLASTY ANTERIOR APPROACH;  Surgeon: Mcarthur Rossetti, MD;  Location: [facility];  Service: Orthopedics;  Laterality: Left;  . TOTAL HIP ARTHROPLASTY Right 09/02/2014   Procedure: RIGHT TOTAL HIP ARTHROPLASTY ANTERIOR APPROACH;  Surgeon: Mcarthur Rossetti, MD;  Location: [facility];  Service: Orthopedics;  Laterality: Right;  . VEIN SURGERY  05/2011   venous ablation     Family History  Problem Relation Age of Onset  . Uterine cancer Mother   . Emphysema Mother   . Diabetes Brother 68  . Hypertension Father 21  . Hyperlipidemia Father   . Epilepsy Father 36  . Colon cancer Father 70  . Leukemia Father   .  Arthritis Sister 69  . Cancer Maternal Grandmother        unk type possibly breast or skin  . Other Paternal Grandmother        influenza   . Stroke Paternal Grandfather   . Cancer Paternal Uncle        unk type  . Skin cancer Daughter        SCC on back  . Esophageal cancer Neg Hx   . Rectal cancer Neg Hx   . Stomach cancer Neg Hx    Social History:  reports that she has never smoked. She has never used smokeless tobacco. She reports current alcohol use. She reports that she does not use drugs.  Allergies:  Allergies  Allergen Reactions  . Hydrolyzed Silk Hives and Other (See Comments)    Silk tape-blisters  . Ciprofloxacin     Reactions with antiarrythmic  . Gluten Meal Other  (See Comments)    reflux  . Metoprolol     dizzy    Facility-Administered Medications Prior to Admission  Medication Dose Route Frequency Provider Last Rate Last Admin  . 0.9 %  sodium chloride infusion  500 mL Intravenous Once Milus Banister, MD       Medications Prior to Admission  Medication Sig Dispense Refill  . acetaminophen (TYLENOL) 500 MG tablet Take 2 tablets (1,000 mg total) by mouth every 6 (six) hours as needed.  0  . Cranberry 500 MG TABS Take 1,000 mg by mouth 2 (two) times daily.    Marland Kitchen diltiazem (TIAZAC) 240 MG 24 hr capsule TAKE 1 CAPSULE BY MOUTH EVERY DAY 90 capsule 3  . ELIQUIS 5 MG TABS tablet TAKE 1 TABLET(5 MG) BY MOUTH TWICE DAILY 180 tablet 1  . MYRBETRIQ 25 MG TB24 tablet Take 25 mg by mouth daily.    Marland Kitchen OVER THE COUNTER MEDICATION Take 20 mg by mouth daily as needed (arthritis). CBD oil    . polyethylene glycol (MIRALAX / GLYCOLAX) 17 g packet Take 17 g by mouth as needed.    . sennosides-docusate sodium (SENOKOT-S) 8.6-50 MG tablet Take 1 tablet by mouth as needed for constipation.    Marland Kitchen spironolactone (ALDACTONE) 50 MG tablet TAKE 2 TABLETS BY MOUTH EVERY MORNING AND 1 TABLET EVERY EVENING 270 tablet 3  . triamcinolone cream (KENALOG) 0.1 % Apply 1 application topically daily as needed (for dry skin).   1  . vitamin C (ASCORBIC ACID) 500 MG tablet Take 500 mg by mouth daily.     Marland Kitchen diltiazem (CARDIZEM) 60 MG tablet Take 60 mg daily if needed for palpitations (Patient taking differently: Take 60 mg by mouth as needed (Palpitation for afib).) 30 tablet 9    Results for orders placed or performed during the hospital encounter of 04/15/20 (from the past 48 hour(s))  Basic metabolic panel per protocol     Status: None   Collection Time: 04/15/20  8:02 AM  Result Value Ref Range   Sodium 139 135 - 145 mmol/L   Potassium 3.9 3.5 - 5.1 mmol/L   Chloride 103 98 - 111 mmol/L   CO2 24 22 - 32 mmol/L   Glucose, Bld 87 70 - 99 mg/dL    Comment: Glucose reference range  applies only to samples taken after fasting for at least 8 hours.   BUN 17 8 - 23 mg/dL   Creatinine, Ser 0.76 0.44 - 1.00 mg/dL   Calcium 9.3 8.9 - 10.3 mg/dL   GFR, Estimated >60 >60 mL/min  Comment: (NOTE) Calculated using the CKD-EPI Creatinine Equation (2021)    Anion gap 12 5 - 15    Comment: Performed at [facility], [place] [address] [place], [place] [address]   No results found.  Review of Systems  All other systems reviewed and are negative.   Blood pressure 120/64, pulse 72, temperature 98.4 F (36.9 C), temperature source Oral, resp. rate 18, height 5\' 9"  (1.753 m), weight 106.5 kg, SpO2 99 %. Physical Exam Constitutional:      Appearance: Normal appearance. She is normal weight.  HENT:     Head: Normocephalic and atraumatic.     Right Ear: External ear normal.     Left Ear: External ear normal.     Nose: Nose normal.     Mouth/Throat:     Mouth: Mucous membranes are moist.     Pharynx: Oropharynx is clear.  Eyes:     Extraocular Movements: Extraocular movements intact.     Conjunctiva/sclera: Conjunctivae normal.     Pupils: Pupils are equal, round, and reactive to light.  Cardiovascular:     Rate and Rhythm: Normal rate.  Pulmonary:     Effort: Pulmonary effort is normal.  Skin:    General: Skin is warm and dry.  Neurological:     General: No focal deficit present.     Mental Status: She is alert and oriented to person, place, and time.  Psychiatric:        Mood and Affect: Mood normal.        Behavior: Behavior normal.        Thought Content: Thought content normal.        Judgment: Judgment normal.      Assessment/Plan Obstructive sleep apnea  To OR for hypoglossal nerve stimulator placement.  Melida Quitter, MD 04/15/2020, 9:03 AM

## 2020-04-15 NOTE — Anesthesia Postprocedure Evaluation (Signed)
Anesthesia Post Note  Patient: [name]  Procedure(s) Performed: IMPLANTATION OF HYPOGLOSSAL NERVE STIMULATOR (Right Chest)     Patient location during evaluation: PACU Anesthesia Type: General Level of consciousness: awake and alert and oriented Pain management: pain level controlled Vital Signs Assessment: post-procedure vital signs reviewed and stable Respiratory status: spontaneous breathing, nonlabored ventilation and respiratory function stable Cardiovascular status: blood pressure returned to baseline Postop Assessment: no apparent nausea or vomiting Anesthetic complications: no   No complications documented.  Last Vitals:  Vitals:   04/15/20 1215 04/15/20 1230  BP: 130/69 131/73  Pulse: 69 67  Resp: 11 15  Temp:    SpO2: 98% 95%    Last Pain:  Vitals:   04/15/20 1230  TempSrc:   PainSc: 0-No pain                 Brennan Bailey

## 2020-04-15 NOTE — Discharge Instructions (Signed)
Next dose of Tylenol can be given after 2:00PM     Post Anesthesia Home Care Instructions  Activity: Get plenty of rest for the remainder of the day. A responsible individual must stay with you for 24 hours following the procedure.  For the next 24 hours, DO NOT: -Drive a car -Paediatric nurse -Drink alcoholic beverages -Take any medication unless instructed by your physician -Make any legal decisions or sign important papers.  Meals: Start with liquid foods such as gelatin or soup. Progress to regular foods as tolerated. Avoid greasy, spicy, heavy foods. If nausea and/or vomiting occur, drink only clear liquids until the nausea and/or vomiting subsides. Call your physician if vomiting continues.  Special Instructions/Symptoms: Your throat may feel dry or sore from the anesthesia or the breathing tube placed in your throat during surgery. If this causes discomfort, gargle with warm salt water. The discomfort should disappear within 24 hours.  If you had a scopolamine patch placed behind your ear for the management of post- operative nausea and/or vomiting:  1. The medication in the patch is effective for 72 hours, after which it should be removed.  Wrap patch in a tissue and discard in the trash. Wash hands thoroughly with soap and water. 2. You may remove the patch earlier than 72 hours if you experience unpleasant side effects which may include dry mouth, dizziness or visual disturbances. 3. Avoid touching the patch. Wash your hands with soap and water after contact with the patch.

## 2020-04-15 NOTE — Anesthesia Procedure Notes (Signed)
Procedure Name: Intubation Date/Time: 04/15/2020 9:20 AM Performed by: Signe Colt, CRNA Pre-anesthesia Checklist: Patient identified, Emergency Drugs available, Suction available and Patient being monitored Patient Re-evaluated:Patient Re-evaluated prior to induction Oxygen Delivery Method: Circle system utilized Preoxygenation: Pre-oxygenation with 100% oxygen Induction Type: IV induction Ventilation: Mask ventilation without difficulty Laryngoscope Size: Glidescope and 4 Grade View: Grade I Tube type: Oral Tube size: 7.0 mm Number of attempts: 1 Airway Equipment and Method: Stylet,  Oral airway and Video-laryngoscopy Placement Confirmation: ETT inserted through vocal cords under direct vision,  positive ETCO2 and breath sounds checked- equal and bilateral Secured at: 21 cm Tube secured with: Tape Dental Injury: Teeth and Oropharynx as per pre-operative assessment  Difficulty Due To: Difficulty was anticipated Comments: Patient with limited range of motion in neck stated "bulging discs" with no pain radiating to either arm. Good oral opening 2 finger TM distance. Read previous intubation notes

## 2020-04-15 NOTE — Op Note (Signed)
PREOPERATIVE DIAGNOSIS:  Obstructive sleep apnea.   POSTOPERATIVE DIAGNOSIS:  Obstructive sleep apnea.   PROCEDURE:  Placement of hypoglossal nerve stimulator including placement of sensor lead and testing of stimulator.   SURGEON:  Melida Quitter, MD   ASSISTANT:  Jolene Provost, PA, who was necessary for assistance with retraction and closure.   ANESTHESIA:  General endotracheal anesthesia.   COMPLICATIONS:  None.   INDICATIONS:  The patient is a 70 year old female with a history of obstructive sleep apnea who has not been able to tolerate CPAP.  She presents to the operating room for placement of hypoglossal nerve stimulator.   FINDINGS:  Surgical anatomy was unremarkable.  The device was tested intraoperatively and demonstrated excellent stimulation and sensor lead function.   DESCRIPTION OF PROCEDURE:  The patient was identified in the holding room, informed consent having been obtained, including discussion of risks, benefits and alternatives, the patient was brought to the operative suite and put on the operative table in  supine position.  Anesthesia was induced and the patient was intubated by the anesthesia team without difficulty.  The patient was given intravenous antibiotics during the case.  The eyes were taped closed and the bed was turned 180 degrees from  anesthesia and a shoulder roll was placed.  The right neck and right chest incisions were marked with a marking pen after measuring and injected with 1% lidocaine with 1:100,000 epinephrine.  The nerve integrity monitor was placed in the right lateral  tongue and floor of mouth and turned on during the case.  The right neck and chest were prepped and draped in sterile fashion.  The neck incision was made with a 15 blade scalpel and extended through subcutaneous tissue to the platysmal layer using Bovie  electrocautery.  Dissection was then extended down the platysma layer somewhat until it was divided more inferiorly.  This  allowed direct dissection down onto the lower portion of the submandibular gland and ultimately the digastric tendon.  The tendon  was retracted inferiorly with two vessel loops.  Further dissection along the digastric exposed the mylohyoid muscle, which was then retracted anteriorly exposing the hypoglossal nerve.  The fascia over the nerve was then divided using bipolar  electrocautery to control bleeding.  The various branches of the nerve were then identified including the C1 branch and the more distal branches off of the main trunk.  Nerve stimulator was then used to identify which branches would be included as  protrusion branches and excluded as retrusion branches.  The break point between that on the superior surface of the nerve was then identified and the inclusion portion of the nerve was then elevated allowing pocket for cuff placement.  The cuff was then  brought into the field and placed around the inclusion branches and properly positioned.  Saline was then injected under the cuff.  The anchor for this lead was then sutured to the digastric tendon using 3-0 Nylon suture in 2 positions.  The stimulating lead was  then fully placed in the neck and covered with a damp gauze.  The infraclavicular incision was made using a 15 blade scalpel and extended through the subcutaneous tissues using Bovie electrocautery down to the pectoralis fascia.  A pocket was then  created inferiorly for the generator.  2 stay sutures were then placed at the superior lateral extent of the wound using 2-0 Nylon suture and these were left in place.  Dissection was then extended through  the pectoralis major muscle overlying the  second intercostal space.  Fatty tissue deep to the muscle was swept exposing the external intercostal muscle.  This muscle was dissected more medially exposing the internal intercostal muscle.  A retractor was then gently placed between those muscles running posteriorly and the sensor lead was  then placed into that pocket easily.  The anchor was then sutured with 3-0 silk suture in 3 positions.  The other anchor was then secured on the pectoralis muscle using 3-0 Nylon in 2 positions.  The neck incision was then uncovered and the lead unraveled.  The tunneling pocket was started with a hemostat under the platysma muscle and extended with the tunneling down over the clavicle to the generator pocket and the stimulating lead was then pulled into  the generator pocket with a little bit of slack left in the neck.  Both leads were then cleaned off with damp gauze and dried.  Each one was placed into the generator tightening down the screws to 2 clicks on both occasions.  Both leads were tugged and  found to be in good position.  The generator was then positioned into the generator pocket and testing then commenced.  Testing demonstrated rightward tongue movement at the higher stimulation level so the stimulating cuff was reexamined and taken off of the nerve.  An additional exclusion nerve branch was separated from the main nerve and the cuff repositioned.  This time, testing demonstrated no rightward deviation.  After testing demonstrated good stimulation and good sensor lead function, each wound was copiously irrigated with saline.  The chest wound was closed in the deep tissues with 3-0 Vicryl suture in simple interrupted fashion and in the subcutaneous layer using 4-0 Vicryl suture in a simple interrupted fashion.  The neck incision was closed in the platysma layer with 3-0 Vicryl suture in a  simple interrupted fashion and then in the subcutaneous layer using 4-0 Vicryl suture in a simple interrupted fashion.  Both incisions were covered with Dermabond.  The patient was then cleaned off and drapes were removed.  The nerve integrity monitor  was removed.  Each incision was then covered with gauze pressure dressing.  The patient was then returned to anesthesia for wakeup and was extubated in the recovery  room in stable condition.

## 2020-04-16 ENCOUNTER — Encounter (HOSPITAL_BASED_OUTPATIENT_CLINIC_OR_DEPARTMENT_OTHER): Payer: Self-pay | Admitting: Otolaryngology

## 2020-04-23 DIAGNOSIS — G4733 Obstructive sleep apnea (adult) (pediatric): Secondary | ICD-10-CM | POA: Diagnosis not present

## 2020-04-29 DIAGNOSIS — D225 Melanocytic nevi of trunk: Secondary | ICD-10-CM | POA: Diagnosis not present

## 2020-04-29 DIAGNOSIS — Z85820 Personal history of malignant melanoma of skin: Secondary | ICD-10-CM | POA: Diagnosis not present

## 2020-04-29 DIAGNOSIS — L814 Other melanin hyperpigmentation: Secondary | ICD-10-CM | POA: Diagnosis not present

## 2020-04-29 DIAGNOSIS — L821 Other seborrheic keratosis: Secondary | ICD-10-CM | POA: Diagnosis not present

## 2020-04-29 DIAGNOSIS — L578 Other skin changes due to chronic exposure to nonionizing radiation: Secondary | ICD-10-CM | POA: Diagnosis not present

## 2020-05-07 DIAGNOSIS — N390 Urinary tract infection, site not specified: Secondary | ICD-10-CM | POA: Diagnosis not present

## 2020-05-07 DIAGNOSIS — N3281 Overactive bladder: Secondary | ICD-10-CM | POA: Diagnosis not present

## 2020-05-13 ENCOUNTER — Other Ambulatory Visit: Payer: Self-pay

## 2020-05-13 ENCOUNTER — Ambulatory Visit: Payer: PPO | Admitting: Neurology

## 2020-05-13 ENCOUNTER — Encounter: Payer: Self-pay | Admitting: Neurology

## 2020-05-13 VITALS — BP 132/80 | HR 75 | Ht 69.0 in | Wt 240.0 lb

## 2020-05-13 DIAGNOSIS — G4733 Obstructive sleep apnea (adult) (pediatric): Secondary | ICD-10-CM

## 2020-05-13 DIAGNOSIS — Z789 Other specified health status: Secondary | ICD-10-CM | POA: Insufficient documentation

## 2020-05-13 NOTE — Progress Notes (Signed)
SLEEP MEDICINE CLINIC    Provider:  Larey Seat, MD  Primary Care Physician:  Lucretia Kern, DO [place] [address]     Referring Provider: Redmond Baseman, MD.         Chief Complaint according to patient   Patient presents with:    . New Patient (Initial Visit)     INSPIRE implanted on 04-15-2020      HISTORY OF PRESENT ILLNESS:  [name] is a 70 -year- old Caucasian female patient was seen here as upon a [facility]   on 05/13/2020.  Chief concern according to patient :     I have the pleasure of seeing [name] today, a right -handed White or Caucasian female with OSA disorder.  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 right orbital floor fracture at age 46, while water skiing, more exophthalmos on the left than right.    The patient had the first sleep study in the year 2021, a HST at [facility],  with a result of an AHI, the patient had undergone a a successful cardiac ablation for atrial fibrillation which has first been seen in 2012.  Dr. Curt Bears was  her electrophysiologist, who recommended a sleep study to make sure that no apnea is present at that if apnea is present and needs to be treated to help sustain the effect and not suffer a reoccurrence of atrial fibrillation.  For this reason  she was treated  to a sleep study at [facility], and was diagnosed with the following. SEVERE OSA, No REM sleep- and NREM AHI and nadir at 84% , AHI was 49.8/h, and supine over 70/h. Split night protocol was invoked - Titrated to CPAP at 16 cm water with a nasal interface which caused pressure marks later on and was replaced by a FFM, which created  Air leaks . The air pressure was increased to 20 cm water, and she cannot tolerate this. No aerophagia. She was forced to sleep supine.  She was scheduled for a BiPAP test, but this took not place- she preferred the [place].  She uses her CPAP without fail _ compliantly but reluctantly: no compliance data were obtained here, [facility].        Family medical /sleep history: No other family member on CPAP with OSA, insomnia, and nosleep walkers.    Social history:  Patient is retired from Pension scheme manager - attorney, both daughters live in [place]- on in law-practice.she moved her from [place] state at age 25,  lives in a household alone with her dog. She has 5 grand children.  Tobacco use- never .  ETOH use: rarely ,  Caffeine intake in form of Coffee(1-2 cups ) Soda( /) Tea ( 2 a day- dinner ) or energy drinks. Regular exercise in form of walking  .       Sleep habits are as follows: The patient's dinner time is between 7 PM. The patient goes to bed at 9-10 PM  and continues to sleep for 4 hours, wakes for one  bathroom breaks, had 4-5 nocturia before CPAP.    The preferred sleep position is supine - due to FFM- , with the support of 1-2 pillows.  Dreams are reportedly  infrequent.  5 AM is the usual rise time. The patient wakes up spontaneously.  She reports  feeling nore refreshed or restored in AM, with symptoms such as dry mouth , morning headaches, and residual fatigue.  Naps are taken infrequently, and were not taken before CPAP.  Review of Systems: Out of a complete 14 system review, the patient complains of only the following symptoms, and all other  reviewed systems are negative.:  Fatigue, sleepiness , snoring, fragmented sleep- by air leaks, nocturia is improved.    How likely are you to doze in the following situations: 0 = not likely, 1 = slight chance, 2 = moderate chance, 3 = high chance   Sitting and Reading? Watching Television? Sitting inactive in a public place (theater or meeting)? As a passenger in a car for an hour without a break? Lying down in the afternoon when circumstances permit? Sitting and talking to someone? Sitting quietly after lunch without alcohol? In a car, while stopped for a few minutes in traffic?   Total = 7 from 12 / 24 points   FSS endorsed at 40/ 63 points.   Social History   Socioeconomic History  . Marital status: Divorced    Spouse name: Not on file  . Number of children: 2  . Years of education: JD  . Highest education level: Not on file  Occupational History  . Occupation: lawyer/RETIRED  Tobacco Use  . Smoking status: Never Smoker  . Smokeless tobacco: Never Used  Vaping Use  . Vaping Use: Never used  Substance and Sexual Activity  . Alcohol use: Yes    Alcohol/week: 0.0 standard drinks    Comment: rarely at [facility]   . Drug use: No  . Sexual activity: Not Currently    Birth control/protection: Surgical  Other Topics Concern  . Not on file  Social History Narrative   Work or School: retired Forensic psychologist      Home Situation: lives alone - takes care of twin grandchildren 7 months in 05/2015      Spiritual Beliefs:       Lifestyle: active, healthy diet      Social Determinants of Health   Financial Resource Strain: Not on file  Food Insecurity: Not on file  Transportation Needs: Not on file  Physical Activity: Not on file  Stress: Not on file  Social Connections: Not on file    Family History  Problem Relation Age of Onset  . Uterine cancer Mother   . Emphysema Mother   . Diabetes Brother 71  . Hypertension Father 62  . Hyperlipidemia Father   . Epilepsy  Father 25  . Colon cancer Father 1  . Leukemia Father   . Arthritis Sister 26  . Cancer Maternal Grandmother        unk type possibly breast or skin  . Other Paternal Grandmother        influenza   . Stroke Paternal Grandfather   . Cancer Paternal Uncle        unk type  . Skin cancer Daughter        SCC on back  . Esophageal cancer Neg Hx   . Rectal cancer Neg Hx   . Stomach cancer Neg Hx  Past Medical History:  Diagnosis Date  . Chronic edema   . Clotting disorder ([place])   . Constipation   . Difficult intubation    pt states that her neck needs to be in a neutral position,  scoliosisand herniated dics in neck  . Family history of colon cancer   . Family history of uterine cancer   . Gallstones   . GERD (gastroesophageal reflux disease)    protonix, hx h. pylori  . Herniated disc, cervical   . History of hiatal hernia   . History of sebaceous adenoma   . History of uterine cancer 04/22/2014   sees Dr. Ronita Hipps; s/p complete hysterectomy  . Hx of colonic polyp   . 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 Melanoma [facility])    sees Dr. Renda Rolls in dermatology right upper arm  . Obesity   . OSA on CPAP    uses CPAP  . Paroxysmal atrial fibrillation ([facility])   . Peripheral vascular disease ([place])   . PONV (postoperative nausea and vomiting)   . Recurrent UTI   . S/P hip replacement   . Scoliosis   . Sleep apnea   . Thyroid nodule   . Urinary incontinence   . Uterine cancer ([facility])    Stage 1  . Venous insufficiency    s/p ablation    Past Surgical History:  Procedure Laterality Date  . ABDOMINAL HYSTERECTOMY  09/10/2013  . ATRIAL FIBRILLATION ABLATION N/A 08/09/2019   Procedure: ATRIAL FIBRILLATION ABLATION;  Surgeon: Constance Haw, MD;  Location: [facility];  Service: Cardiovascular;  Laterality: N/A;  . BUNIONECTOMY  10/1976  . CHOLECYSTECTOMY N/A 03/12/2019   Procedure: XI ROBOTIC ASSISTED CHOLECYSTECTOMY;  Surgeon: Clovis Riley, MD;   Location: [facility];  Service: General;  Laterality: N/A;  . COLONOSCOPY    . COLONOSCOPY WITH PROPOFOL N/A 01/17/2020   Procedure: COLONOSCOPY WITH PROPOFOL;  Surgeon: Jackquline Denmark, MD;  Location: [facility];  Service: Endoscopy;  Laterality: N/A;  . DRUG INDUCED ENDOSCOPY N/A 03/04/2020   Procedure: DRUG INDUCED ENDOSCOPY;  Surgeon: Melida Quitter, MD;  Location: [facility];  Service: ENT;  Laterality: N/A;  . FRACTURE SURGERY  09/1964   floor of orbit right side  . HIATAL HERNIA REPAIR    . IMPLANTATION OF HYPOGLOSSAL NERVE STIMULATOR Right 04/15/2020   Procedure: IMPLANTATION OF HYPOGLOSSAL NERVE STIMULATOR;  Surgeon: Melida Quitter, MD;  Location: [facility];  Service: ENT;  Laterality: Right;  . JOINT REPLACEMENT     bilateral hip replacements  . MELANOMA EXCISION  12/2011  . POLYPECTOMY  01/17/2020   Procedure: POLYPECTOMY;  Surgeon: Jackquline Denmark, MD;  [facility];  Service: Endoscopy;;  . TOTAL HIP ARTHROPLASTY Left 06/17/2014   Procedure: LEFT TOTAL HIP ARTHROPLASTY ANTERIOR APPROACH;  Surgeon: Mcarthur Rossetti, MD;  Location: [facility];  Service: Orthopedics;  Laterality: Left;  . TOTAL HIP ARTHROPLASTY Right 09/02/2014   Procedure: RIGHT TOTAL HIP ARTHROPLASTY ANTERIOR APPROACH;  Surgeon: Mcarthur Rossetti, MD;  Location: [facility];  Service: Orthopedics;  Laterality: Right;  . VEIN SURGERY  05/2011   venous ablation      Current Outpatient Medications on File Prior to Visit  Medication Sig Dispense Refill  . acetaminophen (TYLENOL) 500 MG tablet Take 2 tablets (1,000 mg total) by mouth every 6 (six) hours as needed.  0  . Cranberry 500 MG TABS Take 1,000 mg by mouth 2 (two) times daily.    Marland Kitchen diltiazem (CARDIZEM)  60 MG tablet Take 60 mg daily if needed for palpitations (Patient taking differently: Take 60 mg by mouth as needed (Palpitation for afib).) 30 tablet 9  . diltiazem (TIAZAC) 240 MG 24 hr capsule TAKE 1 CAPSULE BY MOUTH EVERY DAY 90  capsule 3  . ELIQUIS 5 MG TABS tablet TAKE 1 TABLET(5 MG) BY MOUTH TWICE DAILY 180 tablet 1  . MYRBETRIQ 25 MG TB24 tablet Take 25 mg by mouth daily.    Marland Kitchen omeprazole (PRILOSEC) 20 MG capsule Take 20 mg by mouth daily.    Marland Kitchen OVER THE COUNTER MEDICATION Take 20 mg by mouth daily as needed (arthritis). CBD oil    . polyethylene glycol (MIRALAX / GLYCOLAX) 17 g packet Take 17 g by mouth as needed.    . sennosides-docusate sodium (SENOKOT-S) 8.6-50 MG tablet Take 1 tablet by mouth as needed for constipation.    Marland Kitchen spironolactone (ALDACTONE) 50 MG tablet TAKE 2 TABLETS BY MOUTH EVERY MORNING AND 1 TABLET EVERY EVENING 270 tablet 3  . triamcinolone cream (KENALOG) 0.1 % Apply 1 application topically daily as needed (for dry skin).   1  . vitamin C (ASCORBIC ACID) 500 MG tablet Take 500 mg by mouth daily.      No current facility-administered medications on file prior to visit.    Allergies  Allergen Reactions  . Hydrolyzed Silk Hives and Other (See Comments)    Silk tape-blisters  . Gluten Meal Other (See Comments)    reflux  . Metoprolol     dizzy    Physical exam:  Today's Vitals   05/13/20 0850  BP: 132/80  Pulse: 75  Weight: 240 lb (108.9 kg)  Height: 5\' 9"  (1.753 m)   Body mass index is 35.44 kg/m.   Wt Readings from Last 3 Encounters:  05/13/20 240 lb (108.9 kg)  04/15/20 234 lb 12.6 oz (106.5 kg)  03/04/20 235 lb 10.8 oz (106.9 kg)     Ht Readings from Last 3 Encounters:  05/13/20 5\' 9"  (1.753 m)  04/15/20 5\' 9"  (1.753 m)  03/04/20 5\' 9"  (1.753 m)      General: The patient is awake, alert and appears not in acute distress. The patient is well groomed. Head: Normocephalic, atraumatic. Neck is supple. Mallampati 1 (!),  neck circumference: 15.5 inches . Nasal airflow patent, deviated septum.  Retrognathia is mildly present. Dental status:  Cardiovascular:  Regular rate and cardiac rhythm by pulse,  without distended neck veins. Respiratory: Lungs are clear to  auscultation.  Skin:  Without evidence of ankle edema, or rash. Bruises on right neck, INSPIRE surgery- 04-15-2020.  Trunk: The patient's posture is erect.   Neurologic exam : The patient is awake and alert, oriented to place and time.   Memory subjective described as intact.  Attention span & concentration ability appears normal.  Speech is fluent,  without  dysarthria, dysphonia or aphasia.  Mood and affect are appropriate.   Cranial nerves: no loss of smell or taste reported  Pupils are equal and briskly reactive to light. Funduscopic exam deferred. .  Extraocular movements in vertical and horizontal planes were intact and without nystagmus. No Diplopia. Visual fields by finger perimetry are intact. Hearing was intact to soft voice and finger rubbing.   Facial sensation intact to fine touch. Facial motor strength is symmetric and tongue and uvula move midline.  Neck ROM : rotation, tilt and flexion extension were normal for age and shoulder shrug was symmetrical.    Motor exam:  Symmetric bulk, tone and ROM.  she has biateral biceps cog-wheelig.  symmetric grip strength . Right shoulder injury.    Sensory:  Fine touch and vibration were normal.  Proprioception tested in the upper extremities was normal.   Coordination: Rapid alternating movements in the fingers/hands were of normal speed.  The Finger-to-nose maneuver was intact without evidence of ataxia, dysmetria or tremor.   Gait and station: Patient could rise unassisted from a seated position, walked without assistive device.  Stance is of normal width/ base and the patient turned with 3 steps.  Toe and heel walk were deferred.  Deep tendon reflexes: in the  upper and lower extremities are symmetric and intact.  Babinski response was deferred .       After spending a total time of 45 minutes face to face and additional time for physical and neurologic examination, review of laboratory studies,  personal review of imaging  studies, reports and results of other testing and review of referral information / records as far as provided in visit, I have established the following assessments:  1) severe OSA, REM suppression while untreated- no prolonged oxygen desaturation and nadir at 84%.  3) I do think she can expect a reduction to mild apnea from severe by using INSPIRE but the Patient should have undergo weight loss measures to qualify.    My Plan is to proceed with:  Activation next week.  Titration INSPIRE; in about 8 weeks.    I would like to thank Melida Quitter, MD  and Dr Curt Bears, MD,  for allowing me to meet with and to take care of this pleasant patient.   In short, [name] is presenting with low CPAP toleranz in the setting of atrial fibrillation and dx of OSA . ENT found her a suitable candidate by shape of her airway for INSPIRE.  I plan to follow up personally within 3 month.   CC: I will share my notes with Dr Redmond Baseman .  Electronically signed by: Larey Seat, MD 05/13/2020 9:18 AM  [facility] Board certified by The [employer] of Sleep Medicine and  Fellow of the [employer] of Neurology. Medical Director of [employer].

## 2020-05-13 NOTE — Patient Instructions (Signed)
Inspire work up- patient has improved on CPAP but we don't know of REM component.

## 2020-05-19 ENCOUNTER — Ambulatory Visit: Payer: PPO | Admitting: Neurology

## 2020-05-19 ENCOUNTER — Encounter: Payer: Self-pay | Admitting: Neurology

## 2020-05-19 ENCOUNTER — Other Ambulatory Visit: Payer: Self-pay

## 2020-05-19 DIAGNOSIS — Z789 Other specified health status: Secondary | ICD-10-CM | POA: Diagnosis not present

## 2020-05-19 DIAGNOSIS — H21323 Implantation cysts of iris, ciliary body or anterior chamber, bilateral: Secondary | ICD-10-CM

## 2020-05-19 DIAGNOSIS — G4733 Obstructive sleep apnea (adult) (pediatric): Secondary | ICD-10-CM | POA: Diagnosis not present

## 2020-05-19 DIAGNOSIS — Z79899 Other long term (current) drug therapy: Secondary | ICD-10-CM

## 2020-05-19 NOTE — Progress Notes (Unsigned)
INSPIRE activation visit. 05-19-2020.    The patient will return to the sleep lab for a titration of her INSPIRE device , which was activated today.   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  Her implantation was on 04-15-20.  The scar has well-healed and she presents today in the sleep lab bedroom 3 for a activation.  At this time, the device is not activated.  The patient was introduced to the remote control and he began with an amplitude of 1 V.  The patient control will allow now for voltage between 1 and 2.  The pulse width will be 90 s the rate will be 33 Hz the start delay is 30 minutes pulse time will be 15 minutes and therapy duration overnight should be 8 hours.  There were no changes to the exhalation inhalation since setting but no inversion or polarity change.  The patient felt a sensation at 0.8 V but had functional protrusion of the tongue at [name] which she considered comfortable.  The tongue was in midline.  Waveforms were favorable the activation procedure was concluded at 7:57 AM  The patient will receive a check-in call on 08-05-20,  and a sleep study for titration will follow on 19 August 2020 at 9 PM here at [facility] sleep.   A follow-up visit with me as her physician will be on 15 June 22 at 10:30 AM.  Larey Seat, MD

## 2020-05-21 ENCOUNTER — Encounter: Payer: Self-pay | Admitting: Neurology

## 2020-05-21 DIAGNOSIS — G4733 Obstructive sleep apnea (adult) (pediatric): Secondary | ICD-10-CM | POA: Diagnosis not present

## 2020-05-22 ENCOUNTER — Encounter: Payer: Self-pay | Admitting: Neurology

## 2020-05-25 ENCOUNTER — Telehealth: Payer: Self-pay

## 2020-05-25 NOTE — Telephone Encounter (Signed)
Called patient to see how her [name] is going. She is at level 2 and can feel a little stimulation now. She stopped wearing her cpap and will focus on increasing by one each week. She feels better about the device now and is looking forward to the end result. I told her to call me with any questions. She was appreciative.

## 2020-06-17 ENCOUNTER — Telehealth: Payer: Self-pay

## 2020-06-17 NOTE — Telephone Encounter (Signed)
Called patient to check on how she is doing with Inspire. She is at level 5 and that feels comfortable. She has frequent wakenings at night and hopes that gets better. I explained that her apnea is not controlled yet so her arousals may still happen until we get her at therapeutic levels. I encouraged her to keep going. She is doing great. She appreciated the call. I told her to call me with any questions.

## 2020-07-08 ENCOUNTER — Encounter: Payer: Self-pay | Admitting: Family Medicine

## 2020-07-08 ENCOUNTER — Telehealth (INDEPENDENT_AMBULATORY_CARE_PROVIDER_SITE_OTHER): Payer: PPO | Admitting: Family Medicine

## 2020-07-08 DIAGNOSIS — J019 Acute sinusitis, unspecified: Secondary | ICD-10-CM

## 2020-07-08 MED ORDER — AZITHROMYCIN 250 MG PO TABS: ORAL_TABLET | ORAL | 0 refills | Status: DC

## 2020-07-08 NOTE — Progress Notes (Signed)
Subjective:    Patient ID: [name], female    DOB: [birth year], 70 y.o.   MRN: [id number]  HPI Virtual Visit via Video Note  I connected with the patient on 07/08/20 at  2:00 PM EDT by a video enabled telemedicine application and verified that I am speaking with the correct person using two identifiers.  Location patient: home Location provider:work or home office Persons participating in the virtual visit: patient, provider  I discussed the limitations of evaluation and management by telemedicine and the availability of in person appointments. The patient expressed understanding and agreed to proceed.   HPI: Here for 5 days of sinus congestion, blowing green mucus from the nose, and a dry cough. No fever. She has had body aches but no SOB or NVD. She tested negative for the Covid virus this morning. She is drinking fluids and using saline nasal sprays.  ROS: See pertinent positives and negatives per HPI.  Past Medical History:  Diagnosis Date  . Chronic edema   . Clotting disorder ([place])   . Constipation   . Difficult intubation    pt states that her neck needs to be in a neutral position,  scoliosisand herniated dics in neck  . Family history of colon cancer   . Family history of uterine cancer   . Gallstones   . GERD (gastroesophageal reflux disease)    protonix, hx h. pylori  . Herniated disc, cervical   . History of hiatal hernia   . History of sebaceous adenoma   . History of uterine cancer 04/22/2014   sees Dr. Ronita Hipps; s/p complete hysterectomy  . Hx of colonic polyp   . 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 Melanoma [facility])    sees Dr. Renda Rolls in dermatology right upper arm  . Obesity   . OSA on CPAP    uses CPAP  . Paroxysmal atrial fibrillation (HCC)   . Peripheral vascular disease ([place])   . PONV (postoperative nausea and vomiting)   . Recurrent UTI   . S/P hip replacement   . Scoliosis   . Sleep apnea   . Thyroid nodule   . Urinary  incontinence   . Uterine cancer (HCC)    Stage 1  . Venous insufficiency    s/p ablation    Past Surgical History:  Procedure Laterality Date  . ABDOMINAL HYSTERECTOMY  09/10/2013  . ATRIAL FIBRILLATION ABLATION N/A 08/09/2019   Procedure: ATRIAL FIBRILLATION ABLATION;  Surgeon: Constance Haw, MD;  Location: [place] [facility];  Service: Cardiovascular;  Laterality: N/A;  . BUNIONECTOMY  10/1976  . CHOLECYSTECTOMY N/A 03/12/2019   Procedure: XI ROBOTIC ASSISTED CHOLECYSTECTOMY;  Surgeon: Clovis Riley, MD;  [facility];  Service: General;  Laterality: N/A;  . COLONOSCOPY    . COLONOSCOPY WITH PROPOFOL N/A 01/17/2020   Procedure: COLONOSCOPY WITH PROPOFOL;  Surgeon: Jackquline Denmark, MD;  [facility];  Service: Endoscopy;  Laterality: N/A;  . DRUG INDUCED ENDOSCOPY N/A 03/04/2020   Procedure: DRUG INDUCED ENDOSCOPY;  Surgeon: Melida Quitter, MD;  Location: [place];  Service: ENT;  Laterality: N/A;  . FRACTURE SURGERY  09/1964   floor of orbit right side  . HIATAL HERNIA REPAIR    . IMPLANTATION OF HYPOGLOSSAL NERVE STIMULATOR Right 04/15/2020   Procedure: IMPLANTATION OF HYPOGLOSSAL NERVE STIMULATOR;  Surgeon: Melida Quitter, MD;  Location: [place];  Service: ENT;  Laterality: Right;  . JOINT REPLACEMENT     bilateral hip  replacements  . MELANOMA EXCISION  12/2011  . POLYPECTOMY  01/17/2020   Procedure: POLYPECTOMY;  Surgeon: Jackquline Denmark, MD;  [facility];  Service: Endoscopy;;  . TOTAL HIP ARTHROPLASTY Left 06/17/2014   Procedure: LEFT TOTAL HIP ARTHROPLASTY ANTERIOR APPROACH;  Surgeon: Mcarthur Rossetti, MD;  Location: [place];  Service: Orthopedics;  Laterality: Left;  . TOTAL HIP ARTHROPLASTY Right 09/02/2014   Procedure: RIGHT TOTAL HIP ARTHROPLASTY ANTERIOR APPROACH;  Surgeon: Mcarthur Rossetti, MD;  Location: [place];  Service: Orthopedics;  Laterality: Right;  . VEIN SURGERY  05/2011   venous ablation      Family History  Problem Relation Age of Onset  . Uterine cancer Mother   . Emphysema Mother   . Diabetes Brother 74  . Hypertension Father 11  . Hyperlipidemia Father   . Epilepsy Father 19  . Colon cancer Father 52  . Leukemia Father   . Arthritis Sister 42  . Cancer Maternal Grandmother        unk type possibly breast or skin  . Other Paternal Grandmother        influenza   . Stroke Paternal Grandfather   . Cancer Paternal Uncle        unk type  . Skin cancer Daughter        SCC on back  . Esophageal cancer Neg Hx   . Rectal cancer Neg Hx   . Stomach cancer Neg Hx      Current Outpatient Medications:  .  acetaminophen (TYLENOL) 500 MG tablet, Take 2 tablets (1,000 mg total) by mouth every 6 (six) hours as needed., Disp: , Rfl: 0 .  azithromycin (ZITHROMAX Z-PAK) 250 MG tablet, As directed, Disp: 6 each, Rfl: 0 .  Cranberry 500 MG TABS, Take 1,000 mg by mouth 2 (two) times daily., Disp: , Rfl:  .  diltiazem (TIAZAC) 240 MG 24 hr capsule, TAKE 1 CAPSULE BY MOUTH EVERY DAY, Disp: 90 capsule, Rfl: 3 .  ELIQUIS 5 MG TABS tablet, TAKE 1 TABLET(5 MG) BY MOUTH TWICE DAILY, Disp: 180 tablet, Rfl: 1 .  MYRBETRIQ 25 MG TB24 tablet, Take 25 mg by mouth daily., Disp: , Rfl:  .  omeprazole (PRILOSEC) 20 MG capsule, Take 20 mg by mouth daily., Disp: , Rfl:  .  OVER THE COUNTER MEDICATION, Take 20 mg by mouth daily as needed (arthritis). CBD oil, Disp: , Rfl:  .  polyethylene glycol (MIRALAX / GLYCOLAX) 17 g packet, Take 17 g by mouth as needed., Disp: , Rfl:  .  sennosides-docusate sodium (SENOKOT-S) 8.6-50 MG tablet, Take 1 tablet by mouth as needed for constipation., Disp: , Rfl:  .  spironolactone (ALDACTONE) 50 MG tablet, TAKE 2 TABLETS BY MOUTH EVERY MORNING AND 1 TABLET EVERY EVENING, Disp: 270 tablet, Rfl: 3 .  triamcinolone cream (KENALOG) 0.1 %, Apply 1 application topically daily as needed (for dry skin). , Disp: , Rfl: 1 .  vitamin C (ASCORBIC ACID) 500 MG tablet, Take 500  mg by mouth daily. , Disp: , Rfl:  .  diltiazem (CARDIZEM) 60 MG tablet, Take 60 mg daily if needed for palpitations (Patient not taking: Reported on 07/08/2020), Disp: 30 tablet, Rfl: 9  EXAM:  VITALS per patient if applicable:  GENERAL: alert, oriented, appears well and in no acute distress  HEENT: atraumatic, conjunttiva clear, no obvious abnormalities on inspection of external nose and ears  NECK: normal movements of the head and neck  LUNGS: on inspection no signs of respiratory distress,  breathing rate appears normal, no obvious gross SOB, gasping or wheezing  CV: no obvious cyanosis  MS: moves all visible extremities without noticeable abnormality  PSYCH/NEURO: pleasant and cooperative, no obvious depression or anxiety, speech and thought processing grossly intact  ASSESSMENT AND PLAN: Sinusitis, treat with a Zpack.  Alysia Penna, MD  Discussed the following assessment and plan:  No diagnosis found.     I discussed the assessment and treatment plan with the patient. The patient was provided an opportunity to ask questions and all were answered. The patient agreed with the plan and demonstrated an understanding of the instructions.   The patient was advised to call back or seek an in-person evaluation if the symptoms worsen or if the condition fails to improve as anticipated.     Review of Systems     Objective:   Physical Exam        Assessment & Plan:

## 2020-07-31 DIAGNOSIS — R059 Cough, unspecified: Secondary | ICD-10-CM | POA: Diagnosis not present

## 2020-07-31 DIAGNOSIS — J329 Chronic sinusitis, unspecified: Secondary | ICD-10-CM | POA: Diagnosis not present

## 2020-08-04 ENCOUNTER — Telehealth: Payer: Self-pay | Admitting: *Deleted

## 2020-08-04 NOTE — Telephone Encounter (Signed)
-----   Message from Sueanne Margarita, MD sent at 08/03/2020 11:35 AM EDT ----- AHI too high - please find out how often she is changing out the cushion on her device and if she is sleeping on her back

## 2020-08-04 NOTE — Telephone Encounter (Signed)
The patient has been notified of the result and verbalized understanding.  All questions (if any) were answered. Marolyn Hammock, [place] 08/04/2020 10:55 AM.

## 2020-08-05 ENCOUNTER — Other Ambulatory Visit: Payer: Self-pay

## 2020-08-05 ENCOUNTER — Telehealth: Payer: Self-pay

## 2020-08-05 NOTE — Telephone Encounter (Signed)
Readiness check in call for this Inspire patient: Pt is currently on level 10 on her remote. She is having no issues with the therapy or the device. Pt feels she is getting good benefit from therapy and waking refreshed in the morning. She is able to use Inspire all night, every night.

## 2020-08-06 ENCOUNTER — Encounter: Payer: Self-pay | Admitting: Internal Medicine

## 2020-08-06 ENCOUNTER — Ambulatory Visit (INDEPENDENT_AMBULATORY_CARE_PROVIDER_SITE_OTHER): Payer: PPO | Admitting: Internal Medicine

## 2020-08-06 VITALS — BP 120/70 | HR 84 | Temp 97.7°F | Ht 69.0 in | Wt 237.3 lb

## 2020-08-06 DIAGNOSIS — Z8679 Personal history of other diseases of the circulatory system: Secondary | ICD-10-CM | POA: Diagnosis not present

## 2020-08-06 DIAGNOSIS — I480 Paroxysmal atrial fibrillation: Secondary | ICD-10-CM

## 2020-08-06 DIAGNOSIS — K219 Gastro-esophageal reflux disease without esophagitis: Secondary | ICD-10-CM | POA: Diagnosis not present

## 2020-08-06 DIAGNOSIS — Z79899 Other long term (current) drug therapy: Secondary | ICD-10-CM | POA: Diagnosis not present

## 2020-08-06 DIAGNOSIS — Z1231 Encounter for screening mammogram for malignant neoplasm of breast: Secondary | ICD-10-CM

## 2020-08-06 NOTE — Progress Notes (Signed)
Established Patient Office Visit     This visit occurred during the SARS-CoV-2 public health emergency.  Safety protocols were in place, including screening questions prior to the visit, additional usage of staff PPE, and extensive cleaning of exam room while observing appropriate contact time as indicated for disinfecting solutions.    CC/Reason for Visit: Establish care, discuss chronic medical conditions  HPI: [name] is a 70 y.o. female who is coming in today for the above mentioned reasons. Past Medical History is significant for: Atrial fibrillation anticoagulated on Eliquis, followed by Dr. Curt Bears, GERD with occasional use of PPIs, chronic venous insufficiency, obstructive sleep apnea who failed CPAP therapy and now has an implanted inspire device.  She has been dealing with acute sinusitis for about 6 weeks.  She is currently completing an Augmentin course prescribed through urgent care.  She is feeling improved.  She does not smoke, she drinks alcohol rarely and occasionally.  She has no known drug allergies.  Her past surgical history is significant for hiatal hernia repair as well as bilateral hip replacements.  She is retired, she is very involved in the care of her grandchildren.  Her family history is significant for a mother with uterine cancer and a father with colon cancer.  She has no acute complaints today.  She has received 3 COVID vaccines.  She had a colonoscopy in 2021 and does these every 3 years due to her family history.   Past Medical/Surgical History: Past Medical History:  Diagnosis Date  . Chronic edema   . Clotting disorder ([place])   . Constipation   . Difficult intubation    pt states that her neck needs to be in a neutral position,  scoliosisand herniated dics in neck  . Family history of colon cancer   . Family history of uterine cancer   . Gallstones   . GERD (gastroesophageal reflux disease)    protonix, hx h. pylori  . Herniated disc,  cervical   . History of hiatal hernia   . History of sebaceous adenoma   . History of uterine cancer 04/22/2014   sees Dr. Ronita Hipps; s/p complete hysterectomy  . Hx of colonic polyp   . 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 Melanoma [facility]    sees Dr. Renda Rolls in dermatology right upper arm  . Obesity   . OSA on CPAP    uses CPAP  . Paroxysmal atrial fibrillation (HCC)   . Peripheral vascular disease ([place])   . PONV (postoperative nausea and vomiting)   . Recurrent UTI   . S/P hip replacement   . Scoliosis   . Sleep apnea   . Thyroid nodule   . Urinary incontinence   . Uterine cancer (HCC)    Stage 1  . Venous insufficiency    s/p ablation    Past Surgical History:  Procedure Laterality Date  . ABDOMINAL HYSTERECTOMY  09/10/2013  . ATRIAL FIBRILLATION ABLATION N/A 08/09/2019   Procedure: ATRIAL FIBRILLATION ABLATION;  Surgeon: Constance Haw, MD;  Location: [place] [facility];  Service: Cardiovascular;  Laterality: N/A;  . BUNIONECTOMY  10/1976  . CHOLECYSTECTOMY N/A 03/12/2019   Procedure: XI ROBOTIC ASSISTED CHOLECYSTECTOMY;  Surgeon: Clovis Riley, MD;  [facility];  Service: General;  Laterality: N/A;  . COLONOSCOPY    . COLONOSCOPY WITH PROPOFOL N/A 01/17/2020   Procedure: COLONOSCOPY WITH PROPOFOL;  Surgeon: Jackquline Denmark, MD;  [facility];  Service: Endoscopy;  Laterality: N/A;  .  DRUG INDUCED ENDOSCOPY N/A 03/04/2020   Procedure: DRUG INDUCED ENDOSCOPY;  Surgeon: Melida Quitter, MD;  Location: [place];  Service: ENT;  Laterality: N/A;  . FRACTURE SURGERY  09/1964   floor of orbit right side  . HIATAL HERNIA REPAIR    . IMPLANTATION OF HYPOGLOSSAL NERVE STIMULATOR Right 04/15/2020   Procedure: IMPLANTATION OF HYPOGLOSSAL NERVE STIMULATOR;  Surgeon: Melida Quitter, MD;  Location: [place];  Service: ENT;  Laterality: Right;  . JOINT REPLACEMENT     bilateral hip replacements  . MELANOMA EXCISION  12/2011  .  POLYPECTOMY  01/17/2020   Procedure: POLYPECTOMY;  Surgeon: Jackquline Denmark, MD;  [facility];  Service: Endoscopy;;  . TOTAL HIP ARTHROPLASTY Left 06/17/2014   Procedure: LEFT TOTAL HIP ARTHROPLASTY ANTERIOR APPROACH;  Surgeon: Mcarthur Rossetti, MD;  Location: [place];  Service: Orthopedics;  Laterality: Left;  . TOTAL HIP ARTHROPLASTY Right 09/02/2014   Procedure: RIGHT TOTAL HIP ARTHROPLASTY ANTERIOR APPROACH;  Surgeon: Mcarthur Rossetti, MD;  Location: [place];  Service: Orthopedics;  Laterality: Right;  . VEIN SURGERY  05/2011   venous ablation     Social History:  reports that she has never smoked. She has never used smokeless tobacco. She reports current alcohol use. She reports that she does not use drugs.  Allergies: Allergies  Allergen Reactions  . Hydrolyzed Silk Hives and Other (See Comments)    Silk tape-blisters  . Gluten Meal Other (See Comments)    reflux  . Metoprolol     dizzy    Family History:  Family History  Problem Relation Age of Onset  . Uterine cancer Mother   . Emphysema Mother   . Diabetes Brother 48  . Hypertension Father 2  . Hyperlipidemia Father   . Epilepsy Father 68  . Colon cancer Father 28  . Leukemia Father   . Arthritis Sister 74  . Cancer Maternal Grandmother        unk type possibly breast or skin  . Other Paternal Grandmother        influenza   . Stroke Paternal Grandfather   . Cancer Paternal Uncle        unk type  . Skin cancer Daughter        SCC on back  . Esophageal cancer Neg Hx   . Rectal cancer Neg Hx   . Stomach cancer Neg Hx      Current Outpatient Medications:  .  acetaminophen (TYLENOL) 500 MG tablet, Take 2 tablets (1,000 mg total) by mouth every 6 (six) hours as needed., Disp: , Rfl: 0 .  amoxicillin-clavulanate (AUGMENTIN) 875-125 MG tablet, Take by mouth., Disp: , Rfl:  .  Cranberry 500 MG TABS, Take 1,000 mg by mouth 2 (two) times daily., Disp: , Rfl:  .  diltiazem (CARDIZEM) 60 MG tablet,  Take 60 mg daily if needed for palpitations, Disp: 30 tablet, Rfl: 9 .  diltiazem (TIAZAC) 240 MG 24 hr capsule, TAKE 1 CAPSULE BY MOUTH EVERY DAY, Disp: 90 capsule, Rfl: 3 .  ELIQUIS 5 MG TABS tablet, TAKE 1 TABLET(5 MG) BY MOUTH TWICE DAILY, Disp: 180 tablet, Rfl: 1 .  MYRBETRIQ 25 MG TB24 tablet, Take 25 mg by mouth daily., Disp: , Rfl:  .  omeprazole (PRILOSEC) 20 MG capsule, Take 20 mg by mouth daily., Disp: , Rfl:  .  OVER THE COUNTER MEDICATION, Take 20 mg by mouth daily as needed (arthritis). CBD oil, Disp: , Rfl:  .  polyethylene glycol (MIRALAX / GLYCOLAX) 17 g packet, Take 17 g by mouth as needed., Disp: , Rfl:  .  sennosides-docusate sodium (SENOKOT-S) 8.6-50 MG tablet, Take 1 tablet by mouth as needed for constipation., Disp: , Rfl:  .  spironolactone (ALDACTONE) 50 MG tablet, TAKE 2 TABLETS BY MOUTH EVERY MORNING AND 1 TABLET EVERY EVENING, Disp: 270 tablet, Rfl: 3 .  triamcinolone cream (KENALOG) 0.1 %, Apply 1 application topically daily as needed (for dry skin). , Disp: , Rfl: 1 .  vitamin C (ASCORBIC ACID) 500 MG tablet, Take 500 mg by mouth daily. , Disp: , Rfl:   Review of Systems:  Constitutional: Denies fever, chills, diaphoresis, appetite change and fatigue.  HEENT: Denies photophobia, eye pain, redness, hearing loss, ear pain, congestion, sore throat, rhinorrhea, sneezing, mouth sores, trouble swallowing, neck pain, neck stiffness and tinnitus.   Respiratory: Denies SOB, DOE, cough, chest tightness,  and wheezing.   Cardiovascular: Denies chest pain, palpitations and leg swelling.  Gastrointestinal: Denies nausea, vomiting, abdominal pain, diarrhea, constipation, blood in stool and abdominal distention.  Genitourinary: Denies dysuria, urgency, frequency, hematuria, flank pain and difficulty urinating.  Endocrine: Denies: hot or cold intolerance, sweats, changes in hair or nails, polyuria, polydipsia. Musculoskeletal: Denies myalgias, back pain, joint swelling, arthralgias  and gait problem.  Skin: Denies pallor, rash and wound.  Neurological: Denies dizziness, seizures, syncope, weakness, light-headedness, numbness and headaches.  Hematological: Denies adenopathy. Easy bruising, personal or family bleeding history  Psychiatric/Behavioral: Denies suicidal ideation, mood changes, confusion, nervousness, sleep disturbance and agitation    Physical Exam: Vitals:   08/06/20 1334  BP: 120/70  Pulse: 84  Temp: 97.7 F (36.5 C)  TempSrc: Oral  SpO2: 97%  Weight: 237 lb 4.8 oz (107.6 kg)  Height: 5\' 9"  (1.753 m)    Body mass index is 35.04 kg/m.   Constitutional: NAD, calm, comfortable Eyes: PERRL, lids and conjunctivae normal ENMT: Mucous membranes are moist.  Respiratory: clear to auscultation bilaterally, no wheezing, no crackles. Normal respiratory effort. No accessory muscle use.  Cardiovascular: Regular rate and rhythm, no murmurs / rubs / gallops. No extremity edema. Neurologic: Grossly intact and nonfocal Psychiatric: Normal judgment and insight. Alert and oriented x 3. Normal mood.    Impression and Plan:  Paroxysmal atrial fibrillation ([place]) -Well-controlled, status post ablation in May 2021, followed by Dr. Curt Bears.  On diltiazem and anticoagulated on apixaban.  Encounter for screening mammogram for malignant neoplasm of breast  - Plan: MM Digital Screening  Hx of essential hypertension -Blood pressure is very well controlled.  Gastroesophageal reflux disease, unspecified whether esophagitis present -With occasional PPI use.     Lelon Frohlich, MD [facility] at [facility]

## 2020-08-19 ENCOUNTER — Other Ambulatory Visit: Payer: Self-pay

## 2020-08-19 ENCOUNTER — Ambulatory Visit (INDEPENDENT_AMBULATORY_CARE_PROVIDER_SITE_OTHER): Payer: PPO | Admitting: Neurology

## 2020-08-19 DIAGNOSIS — Z789 Other specified health status: Secondary | ICD-10-CM

## 2020-08-19 DIAGNOSIS — G4733 Obstructive sleep apnea (adult) (pediatric): Secondary | ICD-10-CM | POA: Diagnosis not present

## 2020-08-19 DIAGNOSIS — Z79899 Other long term (current) drug therapy: Secondary | ICD-10-CM

## 2020-08-19 DIAGNOSIS — I480 Paroxysmal atrial fibrillation: Secondary | ICD-10-CM

## 2020-08-24 ENCOUNTER — Other Ambulatory Visit: Payer: Self-pay | Admitting: Neurology

## 2020-08-24 DIAGNOSIS — G4733 Obstructive sleep apnea (adult) (pediatric): Secondary | ICD-10-CM

## 2020-08-24 DIAGNOSIS — Z789 Other specified health status: Secondary | ICD-10-CM

## 2020-08-24 DIAGNOSIS — Z79899 Other long term (current) drug therapy: Secondary | ICD-10-CM

## 2020-08-24 DIAGNOSIS — I480 Paroxysmal atrial fibrillation: Secondary | ICD-10-CM

## 2020-08-24 NOTE — Progress Notes (Signed)
We were able to establish baseline apnea at 0 VOLT for 92 minutes with an AHI of 48/h.   1. Titration allowed to reduce the severity to Moderate -severe Sleep Apnea/ Hypopnea - at 1.9V there was still an AHI of 16.8/h noted and events spared REM sleep, indicating a central apnea is likely present as well as hypopnea.  2. The majority of AHI events occurred I supine sleep.   3. Hypoxemia also persisted and required 1 liter additional oxygen, see attached screenshot.  4. Regular rhythm by EKG during this study.    RECOMMENDATIONS:   [name] will not treat central apneas or hypoxemia, but reduced the AHI to 16.8 from 48.3/h. This is at the current setting of 1.9 V. The patient required oxygen during this titration study.  This is a partial treatment and the patient can benefit from weight loss to reduce the AHI further. She is advised to avoid the supine sleep position, which reduced the AHI to below 10/h. Marland Kitchen

## 2020-08-24 NOTE — Progress Notes (Signed)
We were able to establish baseline apnea at 0 VOLT for 92 minutes with an AHI of 48/h.   1. Titration allowed to reduce the severity to Moderate -severe Sleep Apnea/ Hypopnea - at 1.9V there was still an AHI of 16.8/h noted and events spared REM sleep, indicating a central apnea is likely present as well as hypopnea.  2. The majority of AHI events occurred I supine sleep.   3. Hypoxemia also persisted and required 1 liter additional oxygen, see attached screenshot.  4. Regular rhythm by EKG during this study.    RECOMMENDATIONS:   [name] will not treat central apneas or hypoxemia, but reduced the AHI to 16.8 from 48.3/h.  This is a partial treatment and the patient can benefit from weight loss to reduce the AHI further. She is advised to avoid the supine sleep position, which reduced the AHI to below 10/h. Marland Kitchen

## 2020-08-24 NOTE — Procedures (Signed)
PATIENT'S NAME:  [name], [name] DOB:      [birth year]      MR#:    [id number]     DATE OF RECORDING: 08/19/2020 REFERRING M.D.:  Melida Quitter, MD/ Dr. Curt Bears, MD  Study Performed:  Polysomnogram with Inspire Titration HISTORY:   The patient,  [name], date of birth Jul 30, 1950. had presented for consultation on 05-13-2020 at [facility] at after implant. The.Pre-implant baseline AHI was 49.8/h at [facility]- after referral by Dr Curt Bears to [facility] lab in regard to atrial fibrillation.  The patient's hypoglossal nerve stimulator (INSPIRE) was implanted at [facility] on 04/15/2020 by Melida Quitter, MD. The patient's device had been activated at [facility] on 05/19/2020, and outpatient voltage has reached 1.9V. The patient was comfortable while tongue movement was observed. Therapy duration was 8 hours.  The Pre-implant baseline AHI was 49.8 /hour.  BMI was 35.6 kg/m2.  The patient arrived at the sleep lab with an incoming amplitude of 1.9 V.  The patient reports minor subjective benefit.   Epworth score 7 points.  BMI 35 kg/m2.   CURRENT MEDICATIONS: Tylenol, Cardizem, Tiazac, Eliquis, Myrbetriq, Prilosec, Miralax, Senokot, Aldactone, Kenalog, Vitamin C   PROCEDURE:  This is a multichannel digital polysomnogram utilizing the Somnostar 11.2 system.  Electrodes and sensors were applied and monitored per AASM Specifications.   EEG, EOG, Chin and Limb EMG, were sampled at 200 Hz.  ECG, Snore and Nasal Pressure, Thermal Airflow, Respiratory Effort, CPAP Flow and Pressure, Oximetry was sampled at 50 Hz. Digital video and audio were recorded.      BASELINE STUDY: [name] was turned on after the patient fell asleep with a starting amplitude at 1.5 V- a setting that was .0.4 Volts below the Incoming amplitude of: 1.9 Volts. We reduced the setting to 1.5 volts in order to retitrate.   Inspire output was titrated to a maximum amplitude of 1.9 Volts. Therapeutic Amplitude was  found at 1.9 volts.  There was still significant hypoxemia noted and the patient required 1 liter of oxygen in addition.   The patient's AHI at therapeutic amplitude was estimated to be: 1.9 Volt, exactly the incoming setting.   After this sleep study was completed, the patient was programmed back to the Incoming Amplitude of 1.5 V.  In addition, the lower limit was programmed to 0.2V below the incoming amplitude, and the upper limit was programmed to 1.0 V above the lower limit.    Lights Out was at 21:48 and Lights On at 05:20.  Total recording time (TRT) was 452.5 minutes, with a total sleep time (TST) of 358 minutes.   The patient's sleep latency was 7 minutes.  REM latency was 101.5 minutes.  The sleep efficiency was 79.1 %. There were 3 Nocturia breaks.    SLEEP ARCHITECTURE: WASO (Wake after sleep onset) was 87 minutes.  There were 37.5 minutes in Stage N1, 241 minutes Stage N2, 29.5 minutes Stage N3 and 50 minutes in Stage REM.  The percentage of Stage N1 was 10.5%, Stage N2 was 67.3%, Stage N3 was 8.2% and Stage R (REM sleep) was 14.0%.     RESPIRATORY ANALYSIS:  There were a total of 135 respiratory events:  0 obstructive apneas, 0 central apneas and 0 mixed apneas with a total of 115 apneas and an apnea index (AI) of 19.3 /hour. There were 70 hypopneas with a hypopnea index of 11.7 /hour.    The total APNEA/HYPOPNEA INDEX (AHI) was 31. /  hour.  The REM AHI was 2.4 /hour, versus a non-REM AHI of 22.. The patient spent 179 minutes of total sleep time in the supine.   The supine AHI was 53.6 versus a non-supine AHI of 8.4.  OXYGEN SATURATION & C02:  The Wake baseline 02 saturation was 94%, Nadir Sp02 being 75%.  Time spent below 89% saturation equaled 22 minutes after which oxygen was added.  The patient had Periodic Limb Movements. The arousals were noted as: 84 were spontaneous, 3 were associated with PLMs, 135 were associated with respiratory events. Audio and video analysis did not show  any abnormal or unusual movements, behaviors, phonations or vocalizations.   Snoring was noted.  IMPRESSION: We were able to establish baseline at 0 VOLT for 92 minutes with an AHI of 48/h.   Titration allowed to reduce the severity to Moderate -severe Sleep Apnea/ Hypopnea - at 1.9V there was still an AHI of 16.8/h noted and events spared REM sleep, indicating a central apnea is likely present as well as hypopnea.  The majority of AHI events occurred I supine sleep.   Hypoxemia also persisted and required 1 liter additional oxygen, see attached screenshot.  Regular rhythm by EKG during this study.    RECOMMENDATIONS:   [name] will not treat central apneas or hypoxemia, but reduced the AHI to 16.8 from 48.3/h.  This is a partial treatment and the patient can benefit from weight loss to reduce the AHI further. She is advised to avoid the supine sleep position, which reduced the AHI to below 10/h. .     I certify that I have reviewed the entire raw data recording prior to the issuance of this report in accordance with the Standards of Accreditation of the American Academy of Sleep Medicine (AASM)   Larey Seat, MD Diplomat, American Board of Psychiatry and Neurology  Diplomat, American Board of Sleep Medicine Market researcher, [place] [facility] at [employer]

## 2020-08-25 ENCOUNTER — Encounter: Payer: Self-pay | Admitting: Neurology

## 2020-08-25 ENCOUNTER — Telehealth: Payer: Self-pay | Admitting: Neurology

## 2020-08-25 NOTE — Telephone Encounter (Signed)
-----   Message from Larey Seat, MD sent at 08/24/2020  5:11 PM EDT ----- We were able to establish baseline apnea at 0 VOLT for 92 minutes with an AHI of 48/h.   1. Titration allowed to reduce the severity to Moderate -severe Sleep Apnea/ Hypopnea - at 1.9V there was still an AHI of 16.8/h noted and events spared REM sleep, indicating a central apnea is likely present as well as hypopnea.  2. The majority of AHI events occurred I supine sleep.   3. Hypoxemia also persisted and required 1 liter additional oxygen, see attached screenshot.  4. Regular rhythm by EKG during this study.    RECOMMENDATIONS:   [name] will not treat central apneas or hypoxemia, but reduced the AHI to 16.8 from 48.3/h.  This is a partial treatment and the patient can benefit from weight loss to reduce the AHI further. She is advised to avoid the supine sleep position, which reduced the AHI to below 10/h. Marland Kitchen

## 2020-08-25 NOTE — Telephone Encounter (Signed)
Called patient to discuss sleep study results. No answer at this time. LVM for the patient to call back.  Will send a mychart message and will review in more detail at the apt scheduled tomorrow.

## 2020-08-26 ENCOUNTER — Ambulatory Visit: Payer: PPO | Admitting: Neurology

## 2020-08-26 ENCOUNTER — Encounter: Payer: Self-pay | Admitting: Neurology

## 2020-08-26 DIAGNOSIS — G4733 Obstructive sleep apnea (adult) (pediatric): Secondary | ICD-10-CM

## 2020-08-26 DIAGNOSIS — I480 Paroxysmal atrial fibrillation: Secondary | ICD-10-CM

## 2020-08-26 DIAGNOSIS — Z789 Other specified health status: Secondary | ICD-10-CM | POA: Diagnosis not present

## 2020-08-26 DIAGNOSIS — N3000 Acute cystitis without hematuria: Secondary | ICD-10-CM | POA: Diagnosis not present

## 2020-08-26 DIAGNOSIS — R55 Syncope and collapse: Secondary | ICD-10-CM | POA: Diagnosis not present

## 2020-08-26 DIAGNOSIS — I872 Venous insufficiency (chronic) (peripheral): Secondary | ICD-10-CM | POA: Diagnosis not present

## 2020-08-26 DIAGNOSIS — R8271 Bacteriuria: Secondary | ICD-10-CM | POA: Diagnosis not present

## 2020-08-26 NOTE — Progress Notes (Signed)
SLEEP MEDICINE CLINIC    Provider:  Larey Seat, MD  Primary Care Physician:  Isaac Bliss, Rayford [place]sted, MD [place] [address]     Referring Provider: Redmond Baseman, MD.         Chief Complaint according to patient   Patient presents with:     New Patient (Initial Visit)     INSPIRE implanted on 04-15-2020      HISTORY OF PRESENT ILLNESS: 08-26-2020  [name] is a 70 -year- old Caucasian female patient was seen here  on 08/26/2020. She has currently a UTI and her sleep has been affected. Sleep is better in the sense that she can sleep for intervals of up to three hours , interrupted by bathroom breaks. Her  higher V setting became uncomfortable. The most comfortable setting is 1.7 V now, but its not the most therapeutic.  She did not bring her remote control to this visit (!) . Her tongue motion was noticed at 1.7/ 1.8 V and very strong "buckling" of the tongue- at 1.9V that is now uncomfortable. I an attempt to get a smoother tongue movement we will reduce the Voltage- 0.9V, which was felt as smoother,  then 1.0 V- felt very tolerable . Then 1.2 V-felt OK , tongue is moving.  Then 1.4 V - visible tongue motion, stronger but OK.  This follows a titration on INSPIRE device  08-19-2020.  We were able to establish baseline apnea at 0 VOLT for 92 minutes with an AHI of 48/h. ( SEVERE baseline apnea )  1. Titration allowed to reduce the severity to Moderate -severe Sleep Apnea/ Hypopnea - at 1.9V there was still an AHI of 16.8/h noted and events spared REM sleep, indicating a central apnea is likely present as well as hypopnea.  2. The majority of AHI events occurred I supine sleep.   3. Hypoxemia also persisted and required 1 liter additional oxygen, see attached screenshot.  4. Regular rhythm by EKG during this study.   RECOMMENDATIONS:  [name] will not treat central apneas or hypoxemia, but reduced the AHI to 16.8 from 48.3/h. This is at the current  setting of 1.9 V. The patient required oxygen during this titration study, but I cannot order oxygen based on this study- INSPIRE will not correct hypoxemia. This is a partial treatment and the patient can benefit from weight loss to reduce the AHI further. She is advised to avoid the supine sleep position, which reduced the AHI to below 10/h.    The patient will follow up in 6-8 weeks with remote- reset to level 3 for now 0.9/ 1.21 V. Get treated for UTI and then increase to 1.5 V .  New pulse rate ( set now 33) and pulse width 120, allowing a softer tongue motion. Following subjective benefit.      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 right orbital floor fracture at age 51, while water skiing, more exophthalmos on the left than  right.    The patient had the first sleep study in the year 2021, a HST at [facility],  with a result of an AHI, the patient had undergone a a successful cardiac ablation for atrial fibrillation which has first been seen in 2012.  Dr. Curt Bears was  her electrophysiologist, who recommended a sleep study to make sure that no apnea is present at that if apnea is present and needs to be treated to help sustain the effect and not suffer a reoccurrence of atrial fibrillation.  For this reason she was treated  to a sleep  study at [facility], and was diagnosed with the following. SEVERE OSA, No REM sleep- and NREM AHI and nadir at 84% , AHI was 49.8/h, and supine over 70/h. Split night protocol was invoked - Titrated to CPAP at 16 cm water with a nasal interface which caused pressure marks later on and was replaced by a FFM, which created  Air leaks . The air pressure was increased to 20 cm water, and she cannot tolerate this. No aerophagia. She was forced to sleep supine.  She was scheduled for a BiPAP test, but this took not place- she preferred the [place].  She uses her CPAP without fail _ compliantly but reluctantly: no compliance data were obtained here, [facility].     Family medical /sleep history: No other family member on CPAP with OSA, insomnia, and nosleep walkers.  Social history:  Patient is retired from Pension scheme manager - attorney, both daughters live in [place]- on in law-practice.she moved her from [place] state at age 39,  lives in a household alone with her dog. She has 5 grand children.  Tobacco use- never .  ETOH use: rarely ,  Caffeine intake in form of Coffee(1-2 cups ) Soda( /) Tea ( 2 a day- dinner ) or energy drinks. Regular exercise in form of walking  .       Sleep habits are as follows: The patient's dinner time is between 7 PM. The patient goes to bed at 9-10 PM and continues to sleep for 4 hours, wakes for one  bathroom breaks, had 4-5 nocturia before CPAP.    The preferred sleep position is supine - due to FFM- , with the support of 1-2 pillows.  Dreams are reportedly  infrequent.  5 AM is the usual rise time. The patient wakes up spontaneously.  She reports  feeling nore refreshed or restored in AM, with symptoms such as dry mouth , morning headaches, and residual fatigue.  Naps are taken infrequently, and were not taken before CPAP.  Review of Systems: Out of a complete 14 system review, the patient complains of only the following symptoms, and all other reviewed systems are negative.:   Fatigue, sleepiness , snoring, fragmented sleep- by air leaks, nocturia is improved.    How likely are you to doze in the following situations: 0 = not likely, 1 = slight chance, 2 = moderate chance, 3 = high chance   Sitting and Reading? Watching Television? Sitting inactive in a public place (theater or meeting)? As a passenger in a car for an hour without a break? Lying down in the afternoon when circumstances permit? Sitting and talking to someone? Sitting quietly after lunch without alcohol? In a car, while stopped for a few minutes in traffic?   Total = 7 from 12 / 24 points   FSS endorsed at 40/ 63 points.   Social History   Socioeconomic History   Marital status:  Divorced    Spouse name: Not on file   Number of children: 2   Years of education: JD   Highest education level: Not on file  Occupational History   Occupation: lawyer/RETIRED  Tobacco Use   Smoking status: Never   Smokeless tobacco: Never  Vaping Use   Vaping Use: Never used  Substance and Sexual Activity   Alcohol use: Yes    Alcohol/week: 0.0 standard drinks    Comment: rarely at [facility]    Drug use: No   Sexual activity: Not Currently    Birth control/protection: Surgical  Other Topics Concern   Not on file  Social History Narrative   Work or School: retired Forensic psychologist      Home Situation: lives alone - takes care of twin grandchildren 7 months in 05/2015      Spiritual Beliefs:       Lifestyle: active, healthy diet      Social Determinants of Health   Financial Resource Strain: Not on file  Food Insecurity: Not on file  Transportation Needs: Not on file  Physical Activity: Not on file  Stress: Not on file  Social Connections: Not on file    Family History  Problem Relation Age of Onset   Uterine cancer Mother    Emphysema Mother    Diabetes Brother 29   Hypertension Father 72   Hyperlipidemia Father    Epilepsy Father 27   Colon cancer Father 38   Leukemia Father     Arthritis Sister 37   Cancer Maternal Grandmother        unk type possibly breast or skin   Other Paternal Grandmother        influenza    Stroke Paternal Grandfather    Cancer Paternal Uncle        unk type   Skin cancer Daughter        SCC on back   Esophageal cancer Neg Hx    Rectal cancer Neg Hx    Stomach cancer Neg Hx     Past Medical History:  Diagnosis Date   Chronic edema    Clotting disorder ([place])    Constipation    Difficult intubation    pt states that her neck needs to be in a neutral position,  scoliosisand herniated dics in neck   Family history of colon cancer    Family history of uterine cancer    Gallstones    GERD (gastroesophageal reflux disease)    protonix, hx h. pylori   Herniated disc, cervical    History of hiatal hernia    History of sebaceous adenoma    History of uterine cancer 04/22/2014   sees Dr. Ronita Hipps; s/p complete hysterectomy   Hx of colonic polyp    Lumbar and sacral osteoarthritis 12/10/2013   Melanoma ([place])    sees Dr. Renda Rolls in dermatology right upper arm   Obesity    OSA on CPAP    uses CPAP   Paroxysmal atrial fibrillation ([facility])    Peripheral vascular disease ([place])    PONV (postoperative nausea and vomiting)    Recurrent UTI    S/P hip replacement    Scoliosis    Sleep apnea    Thyroid nodule    Urinary incontinence    Uterine cancer ([place])    Stage 1   Venous insufficiency    s/p ablation    Past Surgical History:  Procedure Laterality Date   ABDOMINAL HYSTERECTOMY  09/10/2013   ATRIAL FIBRILLATION  ABLATION N/A 08/09/2019   Procedure: ATRIAL FIBRILLATION ABLATION;  Surgeon: Constance Haw, MD;  Location: [facility];  Service: Cardiovascular;  Laterality: N/A;   BUNIONECTOMY  10/1976   CHOLECYSTECTOMY N/A 03/12/2019   Procedure: XI ROBOTIC ASSISTED CHOLECYSTECTOMY;  Surgeon: Clovis Riley, MD;  Location: [facility];  Service: General;  Laterality: N/A;   COLONOSCOPY     COLONOSCOPY WITH PROPOFOL N/A  01/17/2020   Procedure: COLONOSCOPY WITH PROPOFOL;  Surgeon: Jackquline Denmark, MD;  Location: [facility];  Service: Endoscopy;  Laterality: N/A;   DRUG INDUCED ENDOSCOPY N/A 03/04/2020   Procedure: DRUG INDUCED ENDOSCOPY;  Surgeon: Melida Quitter, MD;  Location: [facility];  Service: ENT;  Laterality: N/A;   FRACTURE SURGERY  09/1964   floor of orbit right side   HIATAL HERNIA REPAIR     IMPLANTATION OF HYPOGLOSSAL NERVE STIMULATOR Right 04/15/2020   Procedure: IMPLANTATION OF HYPOGLOSSAL NERVE STIMULATOR;  Surgeon: Melida Quitter, MD;  Location: [facility];  Service: ENT;  Laterality: Right;   JOINT REPLACEMENT     bilateral hip replacements   MELANOMA EXCISION  12/2011   POLYPECTOMY  01/17/2020   Procedure: POLYPECTOMY;  Surgeon: Jackquline Denmark, MD;  Location: [facility];  Service: Endoscopy;;   TOTAL HIP ARTHROPLASTY Left 06/17/2014   Procedure: LEFT TOTAL HIP ARTHROPLASTY ANTERIOR APPROACH;  Surgeon: Mcarthur Rossetti, MD;  Location: [facility];  Service: Orthopedics;  Laterality: Left;   TOTAL HIP ARTHROPLASTY Right 09/02/2014   Procedure: RIGHT TOTAL HIP ARTHROPLASTY ANTERIOR APPROACH;  Surgeon: Mcarthur Rossetti, MD;  Location: [facility];  Service: Orthopedics;  Laterality: Right;   VEIN SURGERY  05/2011   venous ablation      Current Outpatient Medications on File Prior to Visit  Medication Sig Dispense Refill   acetaminophen (TYLENOL) 500 MG tablet Take 2 tablets (1,000 mg total) by mouth every 6 (six) hours as needed.  0   Cranberry 500 MG TABS Take 1,000 mg by mouth 2 (two) times daily.     diltiazem (CARDIZEM) 60 MG tablet Take 60 mg daily if needed for palpitations 30 tablet 9   diltiazem (TIAZAC) 240 MG 24 hr capsule TAKE 1 CAPSULE BY MOUTH EVERY DAY 90 capsule 3   ELIQUIS 5 MG TABS tablet TAKE 1 TABLET(5 MG) BY MOUTH TWICE DAILY 180 tablet 1   MYRBETRIQ 25 MG TB24 tablet Take 25 mg by mouth daily.     omeprazole (PRILOSEC) 20 MG capsule Take 20 mg by  mouth daily as needed.     OVER THE COUNTER MEDICATION Take 20 mg by mouth daily as needed (arthritis). CBD oil     polyethylene glycol (MIRALAX / GLYCOLAX) 17 g packet Take 17 g by mouth as needed.     sennosides-docusate sodium (SENOKOT-S) 8.6-50 MG tablet Take 1 tablet by mouth as needed for constipation.     spironolactone (ALDACTONE) 50 MG tablet TAKE 2 TABLETS BY MOUTH EVERY MORNING AND 1 TABLET EVERY EVENING 270 tablet 3   triamcinolone cream (KENALOG) 0.1 % Apply 1 application topically daily as needed (for dry skin).   1   vitamin C (ASCORBIC ACID) 500 MG tablet Take 500 mg by mouth 2 (two) times daily.     No current facility-administered medications on file prior to visit.    Allergies  Allergen Reactions   Hydrolyzed Silk Hives and Other (See Comments)    Silk tape-blisters   Gluten Meal Other (See Comments)    reflux  Metoprolol     dizzy    Physical exam:  Today's Vitals   08/26/20 1044  BP: 133/82  Pulse: 80  Weight: 235 lb (106.6 kg)  Height: 5\' 9"  (1.753 m)   Body mass index is 34.7 kg/m.   Wt Readings from Last 3 Encounters:  08/26/20 235 lb (106.6 kg)  08/06/20 237 lb 4.8 oz (107.6 kg)  05/13/20 240 lb (108.9 kg)     Ht Readings from Last 3 Encounters:  08/26/20 5\' 9"  (1.753 m)  08/06/20 5\' 9"  (1.753 m)  05/13/20 5\' 9"  (1.753 m)      General: The patient is awake, alert and appears not in acute distress. The patient is well groomed. Head: Normocephalic, atraumatic. Neck is supple. Mallampati 1 (!),  neck circumference: 15.5 inches . Nasal airflow patent, deviated septum.  Retrognathia is mildly present. Dental status:  Cardiovascular:  Regular rate and cardiac rhythm by pulse,  without distended neck veins. Respiratory: Lungs are clear to auscultation.  Skin:  Without evidence of ankle edema, or rash. Bruises on right neck, INSPIRE surgery- 04-15-2020.  Trunk: The patient's posture is erect.   Neurologic exam : The patient is awake and alert,  oriented to place and time.   Memory subjective described as intact.  Attention span & concentration ability appears normal.  Speech is fluent,  without  dysarthria, dysphonia or aphasia.  Mood and affect are appropriate.   Cranial nerves: no loss of smell or taste reported  Pupils are equal and briskly reactive to light. Funduscopic exam deferred. .  Extraocular movements in vertical and horizontal planes were intact and without nystagmus. No Diplopia. Visual fields by finger perimetry are intact. Hearing was intact to soft voice and finger rubbing.   Facial sensation intact to fine touch. Facial motor strength is symmetric and tongue and uvula move midline.  Neck ROM : rotation, tilt and flexion extension were normal for age and shoulder shrug was symmetrical.    Motor exam:  Symmetric bulk, tone and ROM.  she has biateral biceps cog-wheelig.  symmetric grip strength . Right shoulder injury.    Sensory:  Fine touch and vibration were normal.  Proprioception tested in the upper extremities was normal.   Coordination: Rapid alternating movements in the fingers/hands were of normal speed.  The Finger-to-nose maneuver was intact without evidence of ataxia, dysmetria or tremor.   Gait and station: Patient could rise unassisted from a seated position, walked without assistive device.  Stance is of normal width/ base and the patient turned with 3 steps.  Toe and heel walk were deferred.  Deep tendon reflexes: in the  upper and lower extremities are symmetric and intact.  Babinski response was deferred .       After spending a total time of 20.5 minutes face to face and additional time for physical and neurologic examination, review of laboratory studies,  personal review of imaging studies, reports and results of other testing and review of referral information / records as far as provided in visit, I have established the following assessments:  She is advised to avoid the supine sleep  position, which reduced the AHI to below 10/h.    The patient will follow up in 6-8 weeks with remote- reset to level 3 for now 0.9/ 1.21 V. Get treated for UTI and then increase to 1.5 V .  New pulse rate ( set now 33) and pulse width 120, allowing a softer tongue motion. Following subjective benefit. INSPIRE reset of 3  or more factors.   severe OSA, REM suppression while untreated- now had prolonged oxygen desaturation and nadir at 84% during in lab INSPIRE titration.  I do think she can expect a reduction to mild apnea from severe by using INSPIRE .    My Plan is to proceed with: RV for INSPIRE; in about 8 weeks. We need the remote control to be here.    I would like to thank Melida Quitter, MD  and Dr Curt Bears, MD,  for allowing me to meet with and to take care of this pleasant patient.    CC: I will share my notes with Dr Redmond Baseman .  Electronically signed by: Larey Seat, MD 08/26/2020 11:04 AM  [facility] and [employer] [facility] Board certified by The [employer] of Sleep Medicine and  Fellow of the [employer] of Neurology. Medical Director of [employer].

## 2020-08-26 NOTE — Patient Instructions (Signed)
INSPIRE RV - bring your remote.

## 2020-09-04 ENCOUNTER — Ambulatory Visit: Payer: PPO | Admitting: Cardiology

## 2020-09-04 ENCOUNTER — Encounter: Payer: Self-pay | Admitting: Cardiology

## 2020-09-04 ENCOUNTER — Other Ambulatory Visit: Payer: Self-pay

## 2020-09-04 VITALS — BP 124/82 | HR 75 | Ht 69.5 in | Wt 235.0 lb

## 2020-09-04 DIAGNOSIS — I480 Paroxysmal atrial fibrillation: Secondary | ICD-10-CM

## 2020-09-04 NOTE — Progress Notes (Signed)
Electrophysiology Office Note   Date:  09/04/2020   ID:  Orissa, Arreaga 1950/10/04, MRN [id number]  PCP:  Isaac Bliss, Rayford Halsted, MD  Cardiologist: [facility] Primary Electrophysiologist:  Dr Curt Bears    CC: Evaluation for atrial fibrillation   History of Present Illness: [name] is a 70 y.o. female who is being seen today for the evaluation of atrial fibrillation at the request of No ref. provider found. Presenting today for electrophysiology evaluation.    She has a history significant for atrial fibrillation.  She was initially diagnosed in 2012.  This was controlled with diltiazem.  December 2019, she was seen with multifocal pneumonia and atrial fibrillation with rapid rates.  She was started on diltiazem and flecainide and converted to sinus rhythm.  She was initially planned for A. fib ablation with but was found to have a large hiatal hernia.  She is now status post surgery.  She is status post ablation 08/09/2019.  Today, denies symptoms of palpitations, chest pain, shortness of breath, orthopnea, PND, lower extremity edema, claudication, dizziness, presyncope, syncope, bleeding, or neurologic sequela. The patient is tolerating medications without difficulties.  Since being seen she has done well.  She has no chest pain or shortness of breath.  She has noted no further episodes of atrial fibrillation.  She is recently had a hypoglossal nerve stimulator implanted.  She states that she is happy to not require CPAP or BiPAP.  Her sleep apnea has been better controlled.   Past Medical History:  Diagnosis Date   Chronic edema    Clotting disorder (HCC)    Constipation    Difficult intubation    pt states that her neck needs to be in a neutral position,  scoliosisand herniated dics in neck   Family history of colon cancer    Family history of uterine cancer    Gallstones    GERD (gastroesophageal reflux disease)    protonix, hx h. pylori   Herniated disc, cervical     History of hiatal hernia    History of sebaceous adenoma    History of uterine cancer 04/22/2014   sees Dr. 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 Renda Rolls in dermatology right upper arm   Obesity    OSA on CPAP    uses CPAP   Paroxysmal atrial fibrillation (HCC)    Peripheral vascular disease (HCC)    PONV (postoperative nausea and vomiting)    Recurrent UTI    S/P hip replacement    Scoliosis    Sleep apnea    Thyroid nodule    Urinary incontinence    Uterine cancer ([place])    Stage 1   Venous insufficiency    s/p ablation   Past Surgical History:  Procedure Laterality Date   ABDOMINAL HYSTERECTOMY  09/10/2013   ATRIAL FIBRILLATION ABLATION N/A 08/09/2019   Procedure: ATRIAL FIBRILLATION ABLATION;  Surgeon: Constance Haw, MD;  Location: [facility];  Service: Cardiovascular;  Laterality: N/A;   BUNIONECTOMY  10/1976   CHOLECYSTECTOMY N/A 03/12/2019   Procedure: XI ROBOTIC ASSISTED CHOLECYSTECTOMY;  Surgeon: Clovis Riley, MD;  Location: [facility];  Service: General;  Laterality: N/A;   COLONOSCOPY     COLONOSCOPY WITH PROPOFOL N/A 01/17/2020   Procedure: COLONOSCOPY WITH PROPOFOL;  Surgeon: Jackquline Denmark, MD;  Location: [facility];  Service: Endoscopy;  Laterality: N/A;  DRUG INDUCED ENDOSCOPY N/A 03/04/2020   Procedure: DRUG INDUCED ENDOSCOPY;  Surgeon: Melida Quitter, MD;  Location: [facility];  Service: ENT;  Laterality: N/A;   FRACTURE SURGERY  09/1964   floor of orbit right side   HIATAL HERNIA REPAIR     IMPLANTATION OF HYPOGLOSSAL NERVE STIMULATOR Right 04/15/2020   Procedure: IMPLANTATION OF HYPOGLOSSAL NERVE STIMULATOR;  Surgeon: Melida Quitter, MD;  Location: [facility];  Service: ENT;  Laterality: Right;   JOINT REPLACEMENT     bilateral hip replacements   MELANOMA EXCISION  12/2011   POLYPECTOMY  01/17/2020   Procedure:  POLYPECTOMY;  Surgeon: Jackquline Denmark, MD;  Location: [facility];  Service: Endoscopy;;   TOTAL HIP ARTHROPLASTY Left 06/17/2014   Procedure: LEFT TOTAL HIP ARTHROPLASTY ANTERIOR APPROACH;  Surgeon: Mcarthur Rossetti, MD;  Location: [place];  Service: Orthopedics;  Laterality: Left;   TOTAL HIP ARTHROPLASTY Right 09/02/2014   Procedure: RIGHT TOTAL HIP ARTHROPLASTY ANTERIOR APPROACH;  Surgeon: Mcarthur Rossetti, MD;  Location: [facility];  Service: Orthopedics;  Laterality: Right;   VEIN SURGERY  05/2011   venous ablation      Current Outpatient Medications  Medication Sig Dispense Refill   acetaminophen (TYLENOL) 500 MG tablet Take 2 tablets (1,000 mg total) by mouth every 6 (six) hours as needed.  0   Cranberry 500 MG TABS Take 1,000 mg by mouth 2 (two) times daily.     diltiazem (CARDIZEM) 60 MG tablet Take 60 mg daily if needed for palpitations 30 tablet 9   diltiazem (TIAZAC) 240 MG 24 hr capsule TAKE 1 CAPSULE BY MOUTH EVERY DAY 90 capsule 3   ELIQUIS 5 MG TABS tablet TAKE 1 TABLET(5 MG) BY MOUTH TWICE DAILY 180 tablet 1   MYRBETRIQ 25 MG TB24 tablet Take 25 mg by mouth daily.     omeprazole (PRILOSEC) 20 MG capsule Take 20 mg by mouth daily as needed.     OVER THE COUNTER MEDICATION Take 20 mg by mouth daily as needed (arthritis). CBD oil     polyethylene glycol (MIRALAX / GLYCOLAX) 17 g packet Take 17 g by mouth as needed.     sennosides-docusate sodium (SENOKOT-S) 8.6-50 MG tablet Take 1 tablet by mouth as needed for constipation.     spironolactone (ALDACTONE) 50 MG tablet TAKE 2 TABLETS BY MOUTH EVERY MORNING AND 1 TABLET EVERY EVENING 270 tablet 3   triamcinolone cream (KENALOG) 0.1 % Apply 1 application topically daily as needed (for dry skin).   1   vitamin C (ASCORBIC ACID) 500 MG tablet Take 500 mg by mouth 2 (two) times daily.     No current facility-administered medications for this visit.    Allergies:   Hydrolyzed silk, Gluten meal, and Metoprolol   Social History:   The patient  reports that she has never smoked. She has never used smokeless tobacco. She reports current alcohol use. She reports that she does not use drugs.   Family History:  The patient's family history includes Arthritis (age of onset: 104) in her sister; Cancer in her maternal grandmother and paternal uncle; Colon cancer (age of onset: 15) in her father; Diabetes (age of onset: 24) in her brother; Emphysema in her mother; Epilepsy (age of onset: 72) in her father; Hyperlipidemia in her father; Hypertension (age of onset: 42) in her father; Leukemia in her father; Other in her paternal grandmother; Skin cancer in her daughter; Stroke in her paternal grandfather; Uterine cancer in her mother.  ROS:  Please see the history of present illness.   Otherwise, review of systems is positive for none.   All other systems are reviewed and negative.   PHYSICAL EXAM: VS:  BP 124/82   Pulse 75   Ht 5' 9.5" (1.765 m)   Wt 235 lb (106.6 kg)   BMI 34.21 kg/m  , BMI Body mass index is 34.21 kg/m. GEN: Well nourished, well developed, in no acute distress  HEENT: normal  Neck: no JVD, carotid bruits, or masses Cardiac: RRR; no murmurs, rubs, or gallops,no edema  Respiratory:  clear to auscultation bilaterally, normal work of breathing GI: soft, nontender, nondistended, + BS MS: no deformity or atrophy  Skin: warm and dry Neuro:  Strength and sensation are intact Psych: euthymic mood, full affect  EKG:  EKG is ordered today. Personal review of the ekg ordered shows sinus rhythm, rate 75  Recent Labs: 04/15/2020: BUN 17; Creatinine, Ser 0.76; Potassium 3.9; Sodium 139    Lipid Panel     Component Value Date/Time   CHOL 180 03/20/2018 1117   TRIG 53.0 09/07/2015 1211   HDL 45.20 03/20/2018 1117   CHOLHDL 3 09/07/2015 1211   VLDL 10.6 09/07/2015 1211   LDLCALC 87 09/07/2015 1211     Wt Readings from Last 3 Encounters:  09/04/20 235 lb (106.6 kg)  08/26/20 235 lb (106.6 kg)  08/06/20 237  lb 4.8 oz (107.6 kg)      Other studies Reviewed: Additional studies/ records that were reviewed today include: TTE 08/25/16  Review of the above records today demonstrates:  - Left ventricle: The cavity size was normal. Wall thickness was   increased in a pattern of mild LVH. Doppler parameters are   consistent with abnormal left ventricular relaxation (grade 1   diastolic dysfunction). - Left atrium:  The atrium was normal in size.   Myoview 08/25/17 Nuclear stress EF: 66%. There was no ST segment deviation noted during stress. The study is normal. This is a low risk study. The left ventricular ejection fraction is hyperdynamic (>65%).   Breast attenuation no ischemia or infarct EF 66%  ASSESSMENT AND PLAN:  1.  Paroxysmal atrial fibrillation/typical atrial flutter: Currently on Eliquis, diltiazem.  High risk medication monitoring.  CHA2DS2-VASc of 3.  Status post ablation 08/09/2019.  She is remained in normal rhythm.  We [place] Madding continue with current management.  2.  Obstructive sleep apnea: Found to be severe.  She has had a hypoglossal nerve stimulator implanted.  She is comfortable with overall control.  No changes.   Current medicines are reviewed at length with the patient today.   The patient does not have concerns regarding her medicines.  The following changes were made today: None  Labs/ tests ordered today include:  Orders Placed This Encounter  Procedures   EKG 12-Lead      Disposition: Follow-up with Katharine Rochefort 6 months  Signed, Izreal Kock Meredith Leeds, MD  09/04/2020 11:09 AM     [address] [place] [address] [phone] (office) [phone] (fax)

## 2020-09-04 NOTE — Patient Instructions (Addendum)
Medication Instructions: .  Your physician recommends that you continue on your current medications as directed. Please refer to the Current Medication list given to you today.  *If you need a refill on your cardiac medications before your next appointment, please call your pharmacy*   Lab Work: [place]   If you have labs (blood work) drawn today and your tests are completely normal, you will receive your results only by: [place] (if you have MyChart) OR A paper copy in the mail If you have any lab test that is abnormal or we need to change your treatment, we will call you to review the results.   Testing/Procedures: NONE ORDERED  TODAY   Follow-Up: At [facility], you and your health needs are our priority.  As part of our continuing mission to provide you with exceptional heart care, we have created designated Provider Care Teams.  These Care Teams include your primary Cardiologist (physician) and Advanced Practice Providers (APPs -  Physician Assistants and Nurse Practitioners) who all work together to provide you with the care you need, when you need it.  We recommend signing up for the patient portal called "MyChart".  Sign up information is provided on this After Visit Summary.  MyChart is used to connect with patients for Virtual Visits (Telemedicine).  Patients are able to view lab/test results, encounter notes, upcoming appointments, etc.  Non-urgent messages can be sent to your provider as well.   To learn more about what you can do with MyChart, go to [email or web address].    Your next appointment:   6 month(s)  The format for your next appointment:   In Person  Provider:   You may see DR.Camnitz or one of the following Engineer, manufacturing Providers on your designated Care Team:    Tommye Standard, [place] Legrand Como "Jonni Sanger" Chalmers Cater, [place]   Other Instructions

## 2020-09-09 ENCOUNTER — Other Ambulatory Visit: Payer: Self-pay | Admitting: Cardiology

## 2020-09-09 DIAGNOSIS — I4891 Unspecified atrial fibrillation: Secondary | ICD-10-CM

## 2020-09-09 NOTE — Telephone Encounter (Signed)
Prescription refill request for Eliquis received.  Indication: afib  Last office visit: Camnitz Scr: 0.76, 04/15/2020 Age: 70 yo  Weight: 106.6 kg   Pt is on the correct dose of Eliquis per dosing criteria, prescription refill sent for Eliquis 5mg  BID.

## 2020-09-23 DIAGNOSIS — N139 Obstructive and reflux uropathy, unspecified: Secondary | ICD-10-CM | POA: Diagnosis not present

## 2020-09-23 DIAGNOSIS — N3020 Other chronic cystitis without hematuria: Secondary | ICD-10-CM | POA: Diagnosis not present

## 2020-10-15 ENCOUNTER — Ambulatory Visit: Payer: PPO

## 2020-10-16 DIAGNOSIS — N3000 Acute cystitis without hematuria: Secondary | ICD-10-CM | POA: Diagnosis not present

## 2020-10-16 DIAGNOSIS — R8271 Bacteriuria: Secondary | ICD-10-CM | POA: Diagnosis not present

## 2020-10-27 ENCOUNTER — Telehealth: Payer: Self-pay

## 2020-10-27 NOTE — Telephone Encounter (Signed)
LVM for pt to call me back about moving tomrw's appt to [phone]. Conflict with Inspire rep.

## 2020-10-28 ENCOUNTER — Telehealth: Payer: Self-pay

## 2020-10-28 ENCOUNTER — Other Ambulatory Visit: Payer: Self-pay

## 2020-10-28 ENCOUNTER — Ambulatory Visit: Payer: Self-pay | Admitting: Neurology

## 2020-10-28 ENCOUNTER — Encounter: Payer: Self-pay | Admitting: Neurology

## 2020-10-28 ENCOUNTER — Ambulatory Visit: Payer: PPO | Admitting: Neurology

## 2020-10-28 DIAGNOSIS — G4733 Obstructive sleep apnea (adult) (pediatric): Secondary | ICD-10-CM

## 2020-10-28 DIAGNOSIS — I480 Paroxysmal atrial fibrillation: Secondary | ICD-10-CM

## 2020-10-28 DIAGNOSIS — Z789 Other specified health status: Secondary | ICD-10-CM | POA: Diagnosis not present

## 2020-11-10 NOTE — Patient Instructions (Signed)
INSPIRE adjustment. Slow increase in Voltage.

## 2020-11-10 NOTE — Progress Notes (Signed)
Provider:  Larey Seat, MD  Primary Care Physician:  Isaac Bliss, Rayford Halsted, MD [place] [address]     Referring Provider: Redmond Baseman, MD.         Chief Complaint according to patient   Patient presents with:     New Patient (Initial Visit)     INSPIRE implanted on 04-15-2020      HISTORY OF PRESENT ILLNESS: 08-26-2020  [name] is a 70 -year- old Caucasian female patient was seen here  on 08/26/2020. She has currently a UTI and her sleep has been affected. Sleep is better in the sense that she can sleep for intervals of up to three hours , interrupted by bathroom breaks. Her  higher V setting became uncomfortable. The most comfortable setting is 1.7 V now, but its not the most therapeutic.  She did not bring her remote control to this visit (!) . Her tongue motion was noticed at 1.7/ 1.8 V and very strong "buckling" of the tongue- at 1.9V that is now uncomfortable. I an attempt to get a smoother tongue movement we will reduce the Voltage- 0.9V, which was felt as smoother,  then 1.0 V- felt very tolerable . Then 1.2 V-felt OK , tongue is moving.  Then 1.4 V - visible tongue motion, stronger but OK.  This follows a titration on INSPIRE device  08-19-2020.  We were able to establish baseline apnea at 0 VOLT for 92 minutes with an AHI of 48/h. ( SEVERE baseline apnea )  1. Titration allowed to reduce the severity to Moderate -severe Sleep Apnea/ Hypopnea - at 1.9V there was still an AHI of 16.8/h noted and events spared REM sleep, indicating a central apnea is likely present as well as hypopnea.  2. The majority of AHI events occurred I supine sleep.   3. Hypoxemia also persisted and required 1 liter additional oxygen, see attached screenshot.  4. Regular rhythm by EKG during this study.   RECOMMENDATIONS:  [name] will not treat central apneas or hypoxemia, but reduced the AHI to 16.8 from 48.3/h. This is at the current setting of 1.9 V. The patient required  oxygen during this titration study, but I cannot order oxygen based on this study- INSPIRE will not correct hypoxemia. This is a partial treatment and the patient can benefit from weight loss to reduce the AHI further. She is advised to avoid the supine sleep position, which reduced the AHI to below 10/h.    The patient will follow up in 6-8 weeks with remote- reset to level 3 for now 0.9/ 1.21 V. Get treated for UTI and then increase to 1.5 V .  New pulse rate ( set now 33) and pulse width 120, allowing a softer tongue motion. Following subjective benefit.      [name] -She has a past medical history of Chronic edema, Clotting disorder ([place]), Constipation, Difficult intubation, Family history of colon cancer, Family history of uterine cancer, Gallstones, GERD (gastroesophageal reflux disease), Herniated disc, cervical, History of hiatal hernia, History of sebaceous adenoma, History of uterine cancer (04/22/2014), colonic polyp, Lumbar and sacral osteoarthritis (12/10/2013), Melanoma ([place]), Obesity, OSA on CPAP, Paroxysmal atrial fibrillation ([place]), Peripheral vascular disease ([place]), PONV (postoperative nausea and vomiting), Recurrent UTI, S/P hip replacement, Scoliosis, Sleep apnea, Thyroid nodule, Urinary incontinence, Uterine cancer ([place]), and Venous insufficiency. right orbital floor fracture at age 74, while water skiing, more exophthalmos on the left than right.  Note

## 2020-11-25 ENCOUNTER — Other Ambulatory Visit: Payer: Self-pay

## 2020-11-26 ENCOUNTER — Ambulatory Visit (INDEPENDENT_AMBULATORY_CARE_PROVIDER_SITE_OTHER): Payer: PPO | Admitting: Internal Medicine

## 2020-11-26 ENCOUNTER — Encounter: Payer: Self-pay | Admitting: Internal Medicine

## 2020-11-26 VITALS — BP 110/70 | HR 63 | Temp 98.2°F | Ht 68.25 in | Wt 248.3 lb

## 2020-11-26 DIAGNOSIS — K219 Gastro-esophageal reflux disease without esophagitis: Secondary | ICD-10-CM | POA: Diagnosis not present

## 2020-11-26 DIAGNOSIS — G4733 Obstructive sleep apnea (adult) (pediatric): Secondary | ICD-10-CM

## 2020-11-26 DIAGNOSIS — M160 Bilateral primary osteoarthritis of hip: Secondary | ICD-10-CM | POA: Diagnosis not present

## 2020-11-26 DIAGNOSIS — Z0000 Encounter for general adult medical examination without abnormal findings: Secondary | ICD-10-CM | POA: Diagnosis not present

## 2020-11-26 DIAGNOSIS — Z23 Encounter for immunization: Secondary | ICD-10-CM | POA: Diagnosis not present

## 2020-11-26 DIAGNOSIS — Z86010 Personal history of colonic polyps: Secondary | ICD-10-CM | POA: Diagnosis not present

## 2020-11-26 DIAGNOSIS — Z8679 Personal history of other diseases of the circulatory system: Secondary | ICD-10-CM | POA: Diagnosis not present

## 2020-11-26 DIAGNOSIS — Z13820 Encounter for screening for osteoporosis: Secondary | ICD-10-CM

## 2020-11-26 DIAGNOSIS — I480 Paroxysmal atrial fibrillation: Secondary | ICD-10-CM

## 2020-11-26 NOTE — Progress Notes (Signed)
Established Patient Office Visit     This visit occurred during the SARS-CoV-2 public health emergency.  Safety protocols were in place, including screening questions prior to the visit, additional usage of staff PPE, and extensive cleaning of exam room while observing appropriate contact time as indicated for disinfecting solutions.    CC/Reason for Visit: Annual preventive exam and subsequent Medicare wellness visit  HPI: [name] is a 70 y.o. female who is coming in today for the above mentioned reasons. Past Medical History is significant for: Atrial fibrillation anticoagulated on Eliquis, followed by Dr. Curt Bears, GERD with occasional use of PPIs, chronic venous insufficiency, obstructive sleep apnea who failed CPAP therapy and now has an implanted inspire device.  She has a history of adenomatous colon polyps and has colonoscopies every 3 years, last was in 2021.  She is overdue for DEXA scan, she will be getting her mammogram next week.  She is overdue for shingles vaccination.  She has routine eye and dental care.  She has some minor hearing difficulties but feels like they do not interfere with her daily lifestyle.  She has been riding a stationary bike for about 20 to 30 minutes a day.  She remains very active with her grandchildren.   Past Medical/Surgical History: Past Medical History:  Diagnosis Date   Chronic edema    Clotting disorder (HCC)    Constipation    Difficult intubation    pt states that her neck needs to be in a neutral position,  scoliosisand herniated dics in neck   Family history of colon cancer    Family history of uterine cancer    Gallstones    GERD (gastroesophageal reflux disease)    protonix, hx h. pylori   Herniated disc, cervical    History of hiatal hernia    History of sebaceous adenoma    History of uterine cancer 04/22/2014   sees Dr. Ronita Hipps; s/p complete hysterectomy   Hx of colonic polyp    Lumbar and sacral osteoarthritis  12/10/2013   Melanoma ([place])    sees Dr. Renda Rolls in dermatology right upper arm   Obesity    OSA on CPAP    uses CPAP   Paroxysmal atrial fibrillation (HCC)    Peripheral vascular disease (HCC)    PONV (postoperative nausea and vomiting)    Recurrent UTI    S/P hip replacement    Scoliosis    Sleep apnea    Thyroid nodule    Urinary incontinence    Uterine cancer ([place])    Stage 1   Venous insufficiency    s/p ablation    Past Surgical History:  Procedure Laterality Date   ABDOMINAL HYSTERECTOMY  09/10/2013   ATRIAL FIBRILLATION ABLATION N/A 08/09/2019   Procedure: ATRIAL FIBRILLATION ABLATION;  Surgeon: Constance Haw, MD;  Location: [facility];  Service: Cardiovascular;  Laterality: N/A;   BUNIONECTOMY  10/1976   CHOLECYSTECTOMY N/A 03/12/2019   Procedure: XI ROBOTIC ASSISTED CHOLECYSTECTOMY;  Surgeon: Clovis Riley, MD;  Location: [facility];  Service: General;  Laterality: N/A;   COLONOSCOPY     COLONOSCOPY WITH PROPOFOL N/A 01/17/2020   Procedure: COLONOSCOPY WITH PROPOFOL;  Surgeon: Jackquline Denmark, MD;  Location: [facility];  Service: Endoscopy;  Laterality: N/A;   DRUG INDUCED ENDOSCOPY N/A 03/04/2020   Procedure: DRUG INDUCED ENDOSCOPY;  Surgeon: Melida Quitter, MD;  Location: [facility];  Service: ENT;  Laterality: N/A;   FRACTURE SURGERY  09/1964  floor of orbit right side   HIATAL HERNIA REPAIR     IMPLANTATION OF HYPOGLOSSAL NERVE STIMULATOR Right 04/15/2020   Procedure: IMPLANTATION OF HYPOGLOSSAL NERVE STIMULATOR;  Surgeon: Melida Quitter, MD;  Location: [facility];  Service: ENT;  Laterality: Right;   JOINT REPLACEMENT     bilateral hip replacements   MELANOMA EXCISION  12/2011   POLYPECTOMY  01/17/2020   Procedure: POLYPECTOMY;  Surgeon: Jackquline Denmark, MD;  Location: [facility];  Service: Endoscopy;;   TOTAL HIP ARTHROPLASTY Left 06/17/2014   Procedure: LEFT TOTAL HIP ARTHROPLASTY ANTERIOR APPROACH;  Surgeon: Mcarthur Rossetti, MD;  Location: [facility];  Service: Orthopedics;  Laterality: Left;   TOTAL HIP ARTHROPLASTY Right 09/02/2014   Procedure: RIGHT TOTAL HIP ARTHROPLASTY ANTERIOR APPROACH;  Surgeon: Mcarthur Rossetti, MD;  Location: [facility];  Service: Orthopedics;  Laterality: Right;   VEIN SURGERY  05/2011   venous ablation     Social History:  reports that she has never smoked. She has never used smokeless tobacco. She reports current alcohol use. She reports that she does not use drugs.  Allergies: Allergies  Allergen Reactions   Hydrolyzed Silk Hives and Other (See Comments)    Silk tape-blisters   Gluten Meal Other (See Comments)    reflux   Metoprolol     dizzy    Family History:  Family History  Problem Relation Age of Onset   Uterine cancer Mother    Emphysema Mother    Diabetes Brother 20   Hypertension Father 81   Hyperlipidemia Father    Epilepsy Father 44   Colon cancer Father 30   Leukemia Father    Arthritis Sister 38   Cancer Maternal Grandmother        unk type possibly breast or skin   Other Paternal Grandmother        influenza    Stroke Paternal Grandfather    Cancer Paternal Uncle        unk type   Skin cancer Daughter        SCC on back   Esophageal cancer Neg Hx    Rectal cancer Neg Hx    Stomach cancer Neg Hx      Current Outpatient Medications:    acetaminophen (TYLENOL) 500 MG tablet, Take 2 tablets (1,000 mg total) by mouth every 6 (six) hours as needed., Disp: , Rfl: 0   Cranberry 500 MG TABS, Take 1,000 mg by mouth 2 (two) times daily., Disp: , Rfl:    diltiazem (CARDIZEM) 60 MG tablet, Take 60 mg daily if needed for palpitations, Disp: 30 tablet, Rfl: 9   diltiazem (TIAZAC) 240 MG 24 hr capsule, TAKE 1 CAPSULE BY MOUTH EVERY DAY, Disp: 90 capsule, Rfl: 3   ELIQUIS 5 MG TABS tablet, TAKE 1 TABLET(5 MG) BY MOUTH TWICE DAILY, Disp: 180 tablet, Rfl: 1   methenamine (HIPREX) 1 g tablet, Take 1 g by mouth 2 (two) times daily., Disp: , Rfl:     MYRBETRIQ 50 MG TB24 tablet, Take 50 mg by mouth daily., Disp: , Rfl:    omeprazole (PRILOSEC) 20 MG capsule, Take 20 mg by mouth daily as needed., Disp: , Rfl:    OVER THE COUNTER MEDICATION, Take 20 mg by mouth daily as needed (arthritis). CBD oil, Disp: , Rfl:    polyethylene glycol (MIRALAX / GLYCOLAX) 17 g packet, Take 17 g by mouth as needed., Disp: , Rfl:    sennosides-docusate sodium (SENOKOT-S) 8.6-50 MG tablet, Take  1 tablet by mouth as needed for constipation., Disp: , Rfl:    spironolactone (ALDACTONE) 50 MG tablet, TAKE 2 TABLETS BY MOUTH EVERY MORNING AND 1 TABLET EVERY EVENING, Disp: 270 tablet, Rfl: 3   triamcinolone cream (KENALOG) 0.1 %, Apply 1 application topically daily as needed (for dry skin). , Disp: , Rfl: 1   vitamin C (ASCORBIC ACID) 500 MG tablet, Take 500 mg by mouth 2 (two) times daily., Disp: , Rfl:   Review of Systems:  Constitutional: Denies fever, chills, diaphoresis, appetite change and fatigue.  HEENT: Denies photophobia, eye pain, redness, hearing loss, ear pain, congestion, sore throat, rhinorrhea, sneezing, mouth sores, trouble swallowing, neck pain, neck stiffness and tinnitus.   Respiratory: Denies SOB, DOE, cough, chest tightness,  and wheezing.   Cardiovascular: Denies chest pain, palpitations and leg swelling.  Gastrointestinal: Denies nausea, vomiting, abdominal pain, diarrhea, constipation, blood in stool and abdominal distention.  Genitourinary: Denies dysuria, urgency, frequency, hematuria, flank pain and difficulty urinating.  Endocrine: Denies: hot or cold intolerance, sweats, changes in hair or nails, polyuria, polydipsia. Musculoskeletal: Denies myalgias, back pain, joint swelling, arthralgias and gait problem.  Skin: Denies pallor, rash and wound.  Neurological: Denies dizziness, seizures, syncope, weakness, light-headedness, numbness and headaches.  Hematological: Denies adenopathy. Easy bruising, personal or family bleeding history   Psychiatric/Behavioral: Denies suicidal ideation, mood changes, confusion, nervousness, sleep disturbance and agitation    Physical Exam: Vitals:   11/26/20 1014  BP: 110/70  Pulse: 63  Temp: 98.2 F (36.8 C)  TempSrc: Oral  SpO2: 96%  Weight: 248 lb 4.8 oz (112.6 kg)  Height: 5' 8.25" (1.734 m)    Body mass index is 37.48 kg/m.   Constitutional: NAD, calm, comfortable Eyes: PERRL, lids and conjunctivae normal, wears corrective lenses ENMT: Mucous membranes are moist. Posterior pharynx clear of any exudate or lesions. Normal dentition. Tympanic membrane is pearly white, no erythema or bulging. Neck: normal, supple, no masses, no thyromegaly Respiratory: clear to auscultation bilaterally, no wheezing, no crackles. Normal respiratory effort. No accessory muscle use.  Cardiovascular: Regular rate and rhythm, no murmurs / rubs / gallops. No extremity edema. 2+ pedal pulses. No carotid bruits.  Abdomen: no tenderness, no masses palpated. No hepatosplenomegaly. Bowel sounds positive.  Musculoskeletal: no clubbing / cyanosis. No joint deformity upper and lower extremities. Good ROM, no contractures. Normal muscle tone.  Skin: no rashes, lesions, ulcers. No induration Neurologic: CN 2-12 grossly intact. Sensation intact, DTR normal. Strength 5/5 in all 4.  Psychiatric: Normal judgment and insight. Alert and oriented x 3. Normal mood.   Subsequent Medicare wellness visit   1. Risk factors, based on past  M,S,F -cardiovascular disease risk factors include age   57.  Physical activities: Stationary bicycle   3.  Depression/mood: Stable, not depressed   4.  Hearing: Minor issues that do not interfere with her day-to-day   5.  ADL's: Independent in all ADLs   6.  Fall risk: Low fall risk   7.  Home safety: No problems identified   8.  Height weight, and visual acuity: height and weight as above, vision:  Vision Screening   Right eye Left eye Both eyes  Without correction      With correction '20/20 20/20 20/20 '$  Comments: Dr Chinita Pester - 03/14/20   9.  Counseling: Advise she update her vaccination status   10. Lab orders based on risk factors: Laboratory update will be reviewed   11. Referral : None today   12. Care plan:  Follow-up with me in 6 months   13. Cognitive assessment: No cognitive impairment   14. Screening: Patient provided with a written and personalized 5-10 year screening schedule in the AVS. yes   15. Provider List Update: PCP, cardiology  16. Advance Directives: Full code   17. Opioids: Patient is not on any opioid prescriptions and has no risk factors for a substance use disorder.   [facility] [place] Office Visit from 08/06/2020 in [facility]  PHQ-9 Total Score 2       Fall Risk  11/26/2020 05/13/2020 10/08/2019 03/20/2018 01/01/2018  Falls in the past year? 0 '1 1 1 '$ Yes  Comment - - [facility] Telephone Survey: data to providers prior to load - -  Number falls in past yr: 0 0 1 0 1  Comment - - [facility] Telephone Survey Actual Response = 1 - -  Injury with Fall? 0 1 0 1 Yes  Follow up - - - - Education provided     Impression and Plan:  Encounter for preventive health examination -Advised routine eye and dental care. -Flu vaccine in office today, she will get shingles at pharmacy as well as COVID booster, otherwise immunizations are up-to-date. -Labs will be updated today. -Healthy lifestyle discussed in detail. -She had a colonoscopy in 2021 and is a 3-year callback. -She has a mammogram scheduled for next week. -She prefers to defer further cancer screening due to age, I agree. -DEXA scan requested today for osteoporosis screening.  Hx of colonic polyps -Will be due for repeat colonoscopy in 2024.  Hx of essential hypertension -Well-controlled.    Paroxysmal atrial fibrillation (HCC) - anticoagulated on Eliquis, appears to be in sinus rhythm today.  Gastroesophageal reflux disease, unspecified whether esophagitis  present -On daily PPI therapy, well controlled  OSA (obstructive sleep apnea) -Has an inspire device  Screening for osteoporosis  - Plan: [facility] Bone Density  Need for influenza vaccination -Flu vaccine administered today    Patient Instructions  -Nice seeing you today!!  -Lab work today; will notify you once results are available.  -Flu vaccine today.  -Remember shingles vaccine and COVID booster.Schedule follow up in 6 months.     Lelon Frohlich, MD [facility]

## 2020-11-26 NOTE — Addendum Note (Signed)
Addended by: Westley Hummer B on: 11/26/2020 11:45 AM   Modules accepted: Orders

## 2020-11-26 NOTE — Patient Instructions (Addendum)
-  Nice seeing you today!!  -Lab work today; will notify you once results are available.  -Flu vaccine today.  -Remember shingles vaccine and COVID booster.Schedule follow up in 6 months.

## 2020-11-27 ENCOUNTER — Other Ambulatory Visit: Payer: Self-pay

## 2020-11-27 ENCOUNTER — Other Ambulatory Visit: Payer: PPO

## 2020-11-27 LAB — COMPREHENSIVE METABOLIC PANEL
ALT: 27 U/L (ref 0–35)
AST: 21 U/L (ref 0–37)
Albumin: 4 g/dL (ref 3.5–5.2)
Alkaline Phosphatase: 65 U/L (ref 39–117)
BUN: 21 mg/dL (ref 6–23)
CO2: 25 mEq/L (ref 19–32)
Calcium: 9.2 mg/dL (ref 8.4–10.5)
Chloride: 104 mEq/L (ref 96–112)
Creatinine, Ser: 0.85 mg/dL (ref 0.40–1.20)
GFR: 68.8 mL/min (ref >60.00)
Glucose, Bld: 90 mg/dL (ref 70–99)
Potassium: 4.1 mEq/L (ref 3.5–5.1)
Sodium: 138 mEq/L (ref 135–145)
Total Bilirubin: 0.7 mg/dL (ref 0.2–1.2)
Total Protein: 6.6 g/dL (ref 6.0–8.3)

## 2020-11-27 LAB — LIPID PANEL
Cholesterol: 131 mg/dL (ref 0–200)
HDL: 46.2 mg/dL (ref >39.00)
LDL Cholesterol: 71 mg/dL (ref 0–99)
NonHDL: 85.02
Total CHOL/HDL Ratio: 3
Triglycerides: 72 mg/dL (ref 0.0–149.0)
VLDL: 14.4 mg/dL (ref 0.0–40.0)

## 2020-11-27 LAB — CBC WITH DIFFERENTIAL/PLATELET
Basophils Absolute: 0.1 10*3/uL (ref 0.0–0.1)
Basophils Relative: 1.3 % (ref 0.0–3.0)
Eosinophils Absolute: 0.1 10*3/uL (ref 0.0–0.7)
Eosinophils Relative: 1.8 % (ref 0.0–5.0)
HCT: 40.7 % (ref 36.0–46.0)
Hemoglobin: 13.8 g/dL (ref 12.0–15.0)
Lymphocytes Relative: 25.6 % (ref 12.0–46.0)
Lymphs Abs: 1.4 10*3/uL (ref 0.7–4.0)
MCHC: 34 g/dL (ref 30.0–36.0)
MCV: 102.7 fl — ABNORMAL HIGH (ref 78.0–100.0)
Monocytes Absolute: 0.9 10*3/uL (ref 0.1–1.0)
Monocytes Relative: 15.5 % — ABNORMAL HIGH (ref 3.0–12.0)
Neutro Abs: 3.1 10*3/uL (ref 1.4–7.7)
Neutrophils Relative %: 55.8 % (ref 43.0–77.0)
Platelets: 183 10*3/uL (ref 150.0–400.0)
RBC: 3.96 Mil/uL (ref 3.87–5.11)
RDW: 12.8 % (ref 11.5–15.5)
WBC: 5.5 10*3/uL (ref 4.0–10.5)

## 2020-11-27 LAB — HEMOGLOBIN A1C: Hgb A1c MFr Bld: 5.5 % (ref 4.6–6.5)

## 2020-11-27 LAB — VITAMIN D 25 HYDROXY (VIT D DEFICIENCY, FRACTURES): VITD: 23.93 ng/mL — ABNORMAL LOW (ref 30.00–100.00)

## 2020-11-27 LAB — TSH: TSH: 2.54 u[IU]/mL (ref 0.35–5.50)

## 2020-11-27 LAB — VITAMIN B12: Vitamin B-12: 253 pg/mL (ref 211–911)

## 2020-11-27 NOTE — Addendum Note (Signed)
Addended by: Amanda Cockayne on: 11/27/2020 09:24 AM   Modules accepted: Orders

## 2020-11-27 NOTE — Addendum Note (Signed)
Addended by: Erline Hau on: 11/27/2020 09:24 AM   Modules accepted: Orders

## 2020-11-30 ENCOUNTER — Ambulatory Visit (INDEPENDENT_AMBULATORY_CARE_PROVIDER_SITE_OTHER)
Admission: RE | Admit: 2020-11-30 | Discharge: 2020-11-30 | Disposition: A | Discharge status: Home / Self Care | Payer: PPO | Source: Ambulatory Visit

## 2020-11-30 ENCOUNTER — Other Ambulatory Visit: Payer: Self-pay

## 2020-11-30 DIAGNOSIS — M160 Bilateral primary osteoarthritis of hip: Secondary | ICD-10-CM

## 2020-11-30 DIAGNOSIS — N3000 Acute cystitis without hematuria: Secondary | ICD-10-CM | POA: Diagnosis not present

## 2020-12-01 ENCOUNTER — Other Ambulatory Visit: Payer: Self-pay | Admitting: Internal Medicine

## 2020-12-01 ENCOUNTER — Telehealth: Payer: Self-pay | Admitting: Internal Medicine

## 2020-12-01 ENCOUNTER — Encounter: Payer: Self-pay | Admitting: Internal Medicine

## 2020-12-01 DIAGNOSIS — E559 Vitamin D deficiency, unspecified: Secondary | ICD-10-CM | POA: Insufficient documentation

## 2020-12-01 MED ORDER — VITAMIN D (ERGOCALCIFEROL) 1.25 MG (50000 UNIT) PO CAPS: 50000.0000 [IU] | ORAL_CAPSULE | ORAL | 0 refills | Status: AC

## 2020-12-01 NOTE — Telephone Encounter (Signed)
PT called to find out what the Vitamin D, Ergocalciferol, (DRISDOL) 1.25 MG (50000 UNIT) CAPS capsule is for and why it was prescribed. Please advise.

## 2020-12-02 ENCOUNTER — Other Ambulatory Visit: Payer: Self-pay | Admitting: Internal Medicine

## 2020-12-02 ENCOUNTER — Ambulatory Visit
Admission: RE | Admit: 2020-12-02 | Discharge: 2020-12-02 | Disposition: A | Discharge status: Home / Self Care | Payer: PPO | Source: Ambulatory Visit | Attending: Internal Medicine | Admitting: Internal Medicine

## 2020-12-02 ENCOUNTER — Other Ambulatory Visit: Payer: Self-pay

## 2020-12-02 DIAGNOSIS — E559 Vitamin D deficiency, unspecified: Secondary | ICD-10-CM

## 2020-12-02 DIAGNOSIS — Z1231 Encounter for screening mammogram for malignant neoplasm of breast: Secondary | ICD-10-CM

## 2020-12-02 NOTE — Telephone Encounter (Signed)
Spoke with patient and reviewed lab and dexa results

## 2020-12-09 ENCOUNTER — Other Ambulatory Visit: Payer: Self-pay | Admitting: Internal Medicine

## 2020-12-09 DIAGNOSIS — R928 Other abnormal and inconclusive findings on diagnostic imaging of breast: Secondary | ICD-10-CM

## 2020-12-12 ENCOUNTER — Other Ambulatory Visit: Payer: Self-pay | Admitting: Cardiology

## 2020-12-22 DIAGNOSIS — L821 Other seborrheic keratosis: Secondary | ICD-10-CM | POA: Diagnosis not present

## 2020-12-22 DIAGNOSIS — Z85820 Personal history of malignant melanoma of skin: Secondary | ICD-10-CM | POA: Diagnosis not present

## 2020-12-22 DIAGNOSIS — L814 Other melanin hyperpigmentation: Secondary | ICD-10-CM | POA: Diagnosis not present

## 2020-12-22 DIAGNOSIS — Z23 Encounter for immunization: Secondary | ICD-10-CM | POA: Diagnosis not present

## 2020-12-22 DIAGNOSIS — L570 Actinic keratosis: Secondary | ICD-10-CM | POA: Diagnosis not present

## 2020-12-22 DIAGNOSIS — L578 Other skin changes due to chronic exposure to nonionizing radiation: Secondary | ICD-10-CM | POA: Diagnosis not present

## 2020-12-22 DIAGNOSIS — D225 Melanocytic nevi of trunk: Secondary | ICD-10-CM | POA: Diagnosis not present

## 2020-12-24 ENCOUNTER — Ambulatory Visit
Admission: RE | Admit: 2020-12-24 | Discharge: 2020-12-24 | Disposition: A | Discharge status: Home / Self Care | Payer: PPO | Source: Ambulatory Visit | Attending: Internal Medicine | Admitting: Internal Medicine

## 2020-12-24 ENCOUNTER — Other Ambulatory Visit: Payer: Self-pay | Admitting: Internal Medicine

## 2020-12-24 ENCOUNTER — Other Ambulatory Visit: Payer: Self-pay

## 2020-12-24 DIAGNOSIS — R928 Other abnormal and inconclusive findings on diagnostic imaging of breast: Secondary | ICD-10-CM

## 2020-12-24 DIAGNOSIS — R922 Inconclusive mammogram: Secondary | ICD-10-CM | POA: Diagnosis not present

## 2020-12-29 ENCOUNTER — Ambulatory Visit
Admission: RE | Admit: 2020-12-29 | Discharge: 2020-12-29 | Disposition: A | Discharge status: Home / Self Care | Payer: PPO | Source: Ambulatory Visit | Attending: Internal Medicine | Admitting: Internal Medicine

## 2020-12-29 ENCOUNTER — Other Ambulatory Visit: Payer: Self-pay

## 2020-12-29 DIAGNOSIS — N6322 Unspecified lump in the left breast, upper inner quadrant: Secondary | ICD-10-CM | POA: Diagnosis not present

## 2020-12-29 DIAGNOSIS — H43813 Vitreous degeneration, bilateral: Secondary | ICD-10-CM | POA: Diagnosis not present

## 2020-12-29 DIAGNOSIS — R928 Other abnormal and inconclusive findings on diagnostic imaging of breast: Secondary | ICD-10-CM

## 2020-12-29 DIAGNOSIS — C50412 Malignant neoplasm of upper-outer quadrant of left female breast: Secondary | ICD-10-CM | POA: Diagnosis not present

## 2020-12-29 DIAGNOSIS — N6321 Unspecified lump in the left breast, upper outer quadrant: Secondary | ICD-10-CM | POA: Diagnosis not present

## 2020-12-29 DIAGNOSIS — C50212 Malignant neoplasm of upper-inner quadrant of left female breast: Secondary | ICD-10-CM | POA: Diagnosis not present

## 2020-12-29 DIAGNOSIS — Z170 Estrogen receptor positive status [ER+]: Secondary | ICD-10-CM | POA: Diagnosis not present

## 2021-01-04 ENCOUNTER — Ambulatory Visit: Payer: Self-pay | Admitting: Surgery

## 2021-01-04 DIAGNOSIS — Z170 Estrogen receptor positive status [ER+]: Secondary | ICD-10-CM | POA: Diagnosis not present

## 2021-01-04 DIAGNOSIS — C50412 Malignant neoplasm of upper-outer quadrant of left female breast: Secondary | ICD-10-CM | POA: Diagnosis not present

## 2021-01-07 ENCOUNTER — Telehealth: Payer: Self-pay | Admitting: Radiation Oncology

## 2021-01-07 ENCOUNTER — Other Ambulatory Visit: Payer: Self-pay | Admitting: Internal Medicine

## 2021-01-07 ENCOUNTER — Other Ambulatory Visit: Payer: Self-pay | Admitting: *Deleted

## 2021-01-07 ENCOUNTER — Telehealth: Payer: Self-pay

## 2021-01-07 ENCOUNTER — Telehealth: Payer: Self-pay | Admitting: Hematology and Oncology

## 2021-01-07 DIAGNOSIS — N6320 Unspecified lump in the left breast, unspecified quadrant: Secondary | ICD-10-CM

## 2021-01-07 DIAGNOSIS — C50412 Malignant neoplasm of upper-outer quadrant of left female breast: Secondary | ICD-10-CM

## 2021-01-07 DIAGNOSIS — Z170 Estrogen receptor positive status [ER+]: Secondary | ICD-10-CM | POA: Insufficient documentation

## 2021-01-07 NOTE — Telephone Encounter (Signed)
Scheduled appt per 10/27 referral. Pt is aware of appt date and time.  

## 2021-01-07 NOTE — Telephone Encounter (Signed)
Spoke with pt this afternoon. She has just recently been diagnosed with breast cancer. She is currently in the process of getting MRI approved to see how bad it is. Pt was not at home and could not remember the exact level she is currently on, however, she thinks she has 2 or 3 levels left. Pt recently increased a level this past Sunday, 10/23rd. States she hasn't been sleeping well recently but thinks it's the recent cancer diagnosis causing anxiety. She did state that last night has been her best nights rest in a long time. She is using it every night and has noticed some improvement in her sleep and symptoms. I will do another follow up call with her on next Tuesday, 11/1.

## 2021-01-07 NOTE — Telephone Encounter (Signed)
Called patient to schedule consultation with Dr. Squire. No answer, LVM for return call. 

## 2021-01-11 NOTE — Progress Notes (Signed)
Radiation Oncology         [phone] ________________________________  Initial Outpatient Consultation  Name: [name] MRN: [id number]  Date: 01/12/2021  DOB: 1950-06-09  [id number] Everardo Beals, MD  Erroll Luna, MD   REFERRING PHYSICIAN: Erroll Luna, MD  DIAGNOSIS:    ICD-10-CM   1. Malignant neoplasm of upper-outer quadrant of left breast in female, estrogen receptor positive ([place])  C50.412    Z17.0       Stage T1b vs T2 or T3 (MRI pending) N0 M0  Left Breast UOQ Invasive Mammary Carcinoma, ER+ / PR+ / Her2-, Grade 2  CHIEF COMPLAINT: Here to discuss management of left breast cancer  HISTORY OF PRESENT ILLNESS::[name] is a 70 y.o. female who presented with left breast abnormality on the following imaging: bilateral screening mammogram on the date of 12/02/20 which showed 2 possible areas of distortion in the left breast.  No symptoms, if any, were reported at that time.   Unilateral left breast diagnostic mammogram and left breast ultrasound on 12/24/20 further revealed a large suspicious area of distortion within the superior left breast with corresponding masses on ultrasound showing a 1.0 x 1.0 x 0.8 cm irregular hypoechoic mass left breast 11 o'clock position 3 cm from nipple with surrounding posterior acoustic shadowing. Additionally within the left breast 1 o'clock position 4 cm from nipple there is a 0.9 x 0.7 x 0.9 cm irregular hypoechoic mass. There is approximately 4.3 cm of breast involvement between the 11 o'clock and 1 o'clock masses. There is abnormal appearing tissue intervening between the 2 masses.   No left axillary adenopathy on [place].  Left breast biopsies at the 1 o'clock and 10 o'clock positions on date of 12/29/20 showed invasive mammary carcinoma measuring 1.3 cm in the greatest linear extent.  ER status: 95% positive with moderate staining intensity; PR status 95% positive with strong staining intensity, Her2 status negative; Ki67  5%; Grade 2.  Subsequently, the patient was referred to Dr. Brantley Stage on 01/04/21 to discuss surgical options. Patient was recommended MRI to further evaluate extent of disease before planning surgical intervention.   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    PAST SURGICAL HISTORY: Past Surgical History:  Procedure Laterality Date   ABDOMINAL HYSTERECTOMY  09/10/2013   ATRIAL FIBRILLATION ABLATION N/A 08/09/2019   Procedure: ATRIAL FIBRILLATION ABLATION;  Surgeon: Constance Haw, MD;  Location: [facility];  Service: Cardiovascular;  Laterality: N/A;   BUNIONECTOMY  10/1976   CHOLECYSTECTOMY N/A 03/12/2019   Procedure: XI ROBOTIC ASSISTED CHOLECYSTECTOMY;  Surgeon: Clovis Riley, MD;  Location: [facility];  Service: General;  Laterality: N/A;   COLONOSCOPY     COLONOSCOPY WITH PROPOFOL N/A 01/17/2020   Procedure: COLONOSCOPY WITH PROPOFOL;  Surgeon: Jackquline Denmark, MD;  Location: [facility];  Service: Endoscopy;  Laterality: N/A;   DRUG INDUCED ENDOSCOPY N/A 03/04/2020   Procedure: DRUG INDUCED ENDOSCOPY;  Surgeon: Melida Quitter, MD;  Location:  [facility];  Service: ENT;  Laterality: N/A;   FRACTURE SURGERY  09/1964   floor of orbit right side   HIATAL HERNIA  REPAIR     IMPLANTATION OF HYPOGLOSSAL NERVE STIMULATOR Right 04/15/2020   Procedure: IMPLANTATION OF HYPOGLOSSAL NERVE STIMULATOR;  Surgeon: Melida Quitter, MD;  Location: [place];  Service: ENT;  Laterality: Right;   JOINT REPLACEMENT     bilateral hip replacements   MELANOMA EXCISION  12/2011   POLYPECTOMY  01/17/2020   Procedure: POLYPECTOMY;  Surgeon: Jackquline Denmark, MD;  [facility];  Service: Endoscopy;;   TOTAL HIP ARTHROPLASTY Left 06/17/2014   Procedure: LEFT TOTAL HIP ARTHROPLASTY ANTERIOR APPROACH;  Surgeon: Mcarthur Rossetti, MD;  Location: [facility];  Service: Orthopedics;  Laterality: Left;   TOTAL HIP ARTHROPLASTY Right 09/02/2014   Procedure: RIGHT TOTAL HIP ARTHROPLASTY ANTERIOR APPROACH;  Surgeon: Mcarthur Rossetti, MD;  Location: [place] [facility] [facility];  Service: Orthopedics;  Laterality: Right;   VEIN SURGERY  05/2011   venous ablation     FAMILY HISTORY: family history includes Arthritis (age of onset: 42) in her sister; Cancer in her maternal grandmother and paternal uncle; Colon cancer (age of onset: 59) in her father; Diabetes (age of onset: 73) in her brother; Emphysema in her mother; Epilepsy (age of onset: 48) in her father; Hyperlipidemia in her father; Hypertension (age of onset: 73) in her father; Leukemia in her father; Other in her paternal grandmother; Skin cancer in her daughter; Stroke in her paternal grandfather; Uterine cancer in her mother.  SOCIAL HISTORY:  reports that she has never smoked. She has never used smokeless tobacco. She reports that she does not currently use alcohol. She reports that she does not use drugs.  ALLERGIES: Hydrolyzed silk, Gluten meal, and Metoprolol  MEDICATIONS:  Current Outpatient Medications  Medication Sig Dispense Refill   Cyanocobalamin (VITAMIN B 12) 500 MCG TABS Take 1 tablet by mouth daily.     acetaminophen (TYLENOL) 500 MG tablet Take 2 tablets (1,000 mg total) by mouth every 6 (six) hours as needed.  0    Cranberry 500 MG TABS Take 1,000 mg by mouth 2 (two) times daily.     diltiazem (CARDIZEM) 60 MG tablet Take 60 mg daily if needed for palpitations 30 tablet 9   diltiazem (TIAZAC) 240 MG 24 hr capsule TAKE 1 CAPSULE BY MOUTH EVERY DAY 90 capsule 2   ELIQUIS 5 MG TABS tablet TAKE 1 TABLET(5 MG) BY MOUTH TWICE DAILY 180 tablet 1   methenamine (HIPREX) 1 g tablet Take 1 g by mouth 2 (two) times daily.     MYRBETRIQ 50 MG TB24 tablet Take 50 mg by mouth daily.     omeprazole (PRILOSEC) 20 MG capsule Take 20 mg by mouth daily as needed.     OVER THE COUNTER MEDICATION Take 20 mg by mouth daily as needed (arthritis). CBD oil     polyethylene glycol (MIRALAX / GLYCOLAX) 17 g packet Take 17 g by mouth as needed.     sennosides-docusate sodium (SENOKOT-S) 8.6-50 MG tablet Take 1 tablet by mouth as needed for constipation.     spironolactone (ALDACTONE) 50 MG tablet TAKE 2 TABLETS BY MOUTH EVERY MORNING AND 1 TABLET EVERY EVENING 270 tablet 3   triamcinolone cream (KENALOG) 0.1 % Apply 1 application topically daily as needed (for dry skin).   1   vitamin C (ASCORBIC ACID) 500 MG tablet Take 500 mg by mouth 2 (two) times daily.  Vitamin D, Ergocalciferol, (DRISDOL) 1.25 MG (50000 UNIT) CAPS capsule Take 1 capsule (50,000 Units total) by mouth every 7 (seven) days for 12 doses. 12 capsule 0   No current facility-administered medications for this encounter.    REVIEW OF SYSTEMS: As above in HPI.   PHYSICAL EXAM:  weight is 236 lb (107 kg). Her temperature is 97.1 F (36.2 C) (abnormal). Her blood pressure is 128/91 (abnormal) and her pulse is 90. Her respiration is 20 and oxygen saturation is 99%.   General: Alert and oriented, in no acute distress HEENT: Head is normocephalic. Extraocular movements are intact.  Heart: Regular in rate and rhythm with no murmurs, rubs, or gallops. Chest: Clear to auscultation bilaterally, with no rhonchi, wheezes, or rales. Skin: No concerning lesions; she does  have bruising of the left breast.  Musculoskeletal: symmetric strength and muscle tone throughout. Neurologic: Cranial nerves II through XII are grossly intact. No obvious focalities. Speech is fluent. Coordination is intact. Psychiatric: Judgment and insight are intact. Affect is appropriate. Breasts: notable for postbiopsy bruising and swelling in the left breast with a palpable mass at 1:00, measuring 1-1/2 cm. No other palpable masses appreciated in the breasts or axillae bilaterally.   ECOG = 0  0 - Asymptomatic (Fully active, able to carry on all predisease activities without restriction)  1 - Symptomatic but completely ambulatory (Restricted in physically strenuous activity but ambulatory and able to carry out work of a light or sedentary nature. For example, light housework, office work)  2 - Symptomatic, <50% in bed during the day (Ambulatory and capable of all self care but unable to carry out any work activities. Up and about more than 50% of waking hours)  3 - Symptomatic, >50% in bed, but not bedbound (Capable of only limited self-care, confined to bed or chair 50% or more of waking hours)  4 - Bedbound (Completely disabled. Cannot carry on any self-care. Totally confined to bed or chair)  5 - Death   Eustace Pen MM, Creech RH, Tormey DC, et al. [phone]). "Toxicity and response criteria of the [facility]". [place] Oncol. 5 (6): 649-55   LABORATORY DATA:  Lab Results  Component Value Date   WBC 5.5 11/27/2020   HGB 13.8 11/27/2020   HCT 40.7 11/27/2020   MCV 102.7 (H) 11/27/2020   PLT 183.0 11/27/2020   CMP     Component Value Date/Time   NA 138 11/27/2020 0924   NA 140 08/07/2019 0825   K 4.1 11/27/2020 0924   CL 104 11/27/2020 0924   CO2 25 11/27/2020 0924   GLUCOSE 90 11/27/2020 0924   BUN 21 11/27/2020 0924   BUN 23 08/07/2019 0825   CREATININE 0.85 11/27/2020 0924   CREATININE 0.88 06/22/2016 0930   CALCIUM 9.2 11/27/2020 0924   PROT  6.6 11/27/2020 0924   ALBUMIN 4.0 11/27/2020 0924   AST 21 11/27/2020 0924   ALT 27 11/27/2020 0924   ALKPHOS 65 11/27/2020 0924   BILITOT 0.7 11/27/2020 0924   GFRNONAA >60 04/15/2020 0802   GFRAA 89 08/07/2019 0825         RADIOGRAPHY: US BREAST LTD UNI LEFT INC AXILLA  Result Date: 12/24/2020 CLINICAL DATA:  Patient recalled from screening for left breast distortion. EXAM: DIGITAL DIAGNOSTIC UNILATERAL LEFT MAMMOGRAM WITH TOMOSYNTHESIS AND CAD; ULTRASOUND LEFT BREAST LIMITED TECHNIQUE: Left digital diagnostic mammography and breast tomosynthesis was performed. The images were evaluated with computer-aided detection.; Targeted ultrasound examination of the left breast was  performed. COMPARISON:  Previous exam(s). ACR Breast Density Category c: The breast tissue is heterogeneously dense, which may obscure small masses. FINDINGS: There is a large area of distortion within the superior left breast measuring approximately 5 cm. Targeted ultrasound is performed, showing a 1.0 x 1.0 x 0.8 cm irregular hypoechoic mass left breast 11 o'clock position 3 cm from nipple with surrounding posterior acoustic shadowing. Additionally within the left breast 1 o'clock position 4 cm from nipple there is a 0.9 x 0.7 x 0.9 cm irregular hypoechoic mass. There is approximately 4.3 cm of breast involvement between the 11 o'clock and 1 o'clock masses. There is abnormal appearing tissue intervening between the 2 masses. No left axillary adenopathy. IMPRESSION: Large suspicious area of distortion within the superior left breast with corresponding masses on ultrasound. RECOMMENDATION: Ultrasound-guided core needle biopsy of the 11 o'clock position mass and ultrasound-guided core needle biopsy of the 1 o'clock position mass. I have discussed the findings and recommendations with the patient. If applicable, a reminder letter will be sent to the patient regarding the next appointment. BI-RADS CATEGORY  4: Suspicious.  Electronically Signed   By: Lovey Newcomer M.D.   On: 12/24/2020 12:17  MM DIAG BREAST TOMO UNI LEFT  Result Date: 12/24/2020 CLINICAL DATA:  Patient recalled from screening for left breast distortion. EXAM: DIGITAL DIAGNOSTIC UNILATERAL LEFT MAMMOGRAM WITH TOMOSYNTHESIS AND CAD; ULTRASOUND LEFT BREAST LIMITED TECHNIQUE: Left digital diagnostic mammography and breast tomosynthesis was performed. The images were evaluated with computer-aided detection.; Targeted ultrasound examination of the left breast was performed. COMPARISON:  Previous exam(s). ACR Breast Density Category c: The breast tissue is heterogeneously dense, which may obscure small masses. FINDINGS: There is a large area of distortion within the superior left breast measuring approximately 5 cm. Targeted ultrasound is performed, showing a 1.0 x 1.0 x 0.8 cm irregular hypoechoic mass left breast 11 o'clock position 3 cm from nipple with surrounding posterior acoustic shadowing. Additionally within the left breast 1 o'clock position 4 cm from nipple there is a 0.9 x 0.7 x 0.9 cm irregular hypoechoic mass. There is approximately 4.3 cm of breast involvement between the 11 o'clock and 1 o'clock masses. There is abnormal appearing tissue intervening between the 2 masses. No left axillary adenopathy. IMPRESSION: Large suspicious area of distortion within the superior left breast with corresponding masses on ultrasound. RECOMMENDATION: Ultrasound-guided core needle biopsy of the 11 o'clock position mass and ultrasound-guided core needle biopsy of the 1 o'clock position mass. I have discussed the findings and recommendations with the patient. If applicable, a reminder letter will be sent to the patient regarding the next appointment. BI-RADS CATEGORY  4: Suspicious. Electronically Signed   By: Lovey Newcomer M.D.   On: 12/24/2020 12:17  MM CLIP PLACEMENT LEFT  Result Date: 12/29/2020 CLINICAL DATA:  Status post ultrasound-guided biopsy of a mass at the 1  o'clock axis followed by ultrasound-guided biopsy of a mass at the 11 o'clock axis. EXAM: 3D DIAGNOSTIC LEFT MAMMOGRAM POST ULTRASOUND BIOPSY x2 COMPARISON:  Previous exam(s). FINDINGS: 3D Mammographic images were obtained following ultrasound guided biopsy of the LEFT breast mass at the 1 o'clock axis followed by the LEFT breast mass at the 11 o'clock axis. The biopsy marking clips are in expected position at the sites of biopsy. IMPRESSION: 1. Appropriate positioning of the ribbon shaped biopsy marking clip at the site of biopsy in the upper outer quadrant of the LEFT breast corresponding to the targeted mass at the 1 o'clock axis. 2. Appropriate positioning  of the coil shaped biopsy marking clip at the site of biopsy in the upper inner quadrant of the LEFT breast corresponding to the targeted mass at the 11 o'clock axis. Final Assessment: Post Procedure Mammograms for Marker Placement Electronically Signed   By: Franki Cabot M.D.   On: 12/29/2020 12:11  [place] LT BREAST BX W LOC DEV 1ST LESION IMG BX SPEC US GUIDE  Addendum Date: 12/30/2020   ADDENDUM REPORT: 12/30/2020 14:33 ADDENDUM: Pathology revealed GRADE II INVASIVE MAMMARY CARCINOMA of the LEFT breast, 1:00 o'clock, ribbon clip this was found to be concordant by Dr. Franki Cabot. Pathology revealed GRADE II INVASIVE MAMMARY CARCINOMA of the LEFT breast, 11:00 o'clock, coil clip. This was found to be concordant by Dr. Franki Cabot. Pathology results were discussed with the patient by telephone. The patient reported doing well after the biopsies with tenderness at the sites. Post biopsy instructions and care were reviewed and questions were answered. The patient was encouraged to call [facility] for any additional concerns. Surgical consultation has been arranged with Dr. Christie Beckers at [facility] on January 04, 2021. If breast conservation is considered, recommend breast MRI to further define extent of disease  given the 4 cm extent of distortion in the upper LEFT breast. Pathology results reported by Stacie Acres RN on 12/30/2020. Electronically Signed   By: Franki Cabot M.D.   On: 12/30/2020 14:33   Addendum Date: 12/29/2020   ADDENDUM REPORT: 12/29/2020 13:27 ADDENDUM: Additional images have been sent to PACS which properly demonstrate the appropriate location and sequence of today's ultrasound-guided biopsies. Electronically Signed   By: Franki Cabot M.D.   On: 12/29/2020 13:27   Addendum Date: 12/29/2020   ADDENDUM REPORT: 12/29/2020 11:54 ADDENDUM: Error in report. The locations for the biopsies are reversed in the original report and will be now corrected as follows: Site 1: Lesion quadrant: Upper outer quadrant Using sterile technique and 1 percent lidocaine as local anesthetic, under direct ultrasound visualization, a 12 gauge spring-loaded device was used to perform biopsy of the LEFT breast mass at the 1 o'clock axis using a lateral approach. At the conclusion of the procedure, a ribbon shaped clip was placed at the biopsy site. Site 2: Lesion quadrant: Upper inner quadrant Using sterile technique and 1 percent lidocaine as local anesthetic, and direct ultrasound visualization, a 12 gauge spring-loaded device was used perform biopsy of the LEFT breast mass at the 11 o'clock axis using a lateral approach. At the conclusion of the procedure, a coil shaped clip was placed at the biopsy site. Impression: 1. Ultrasound-guided biopsy of the LEFT breast mass at the 1 o'clock axis. Specimen container labeled site 1. Ribbon shaped clip placed at the biopsy site. No apparent complications. 2. Ultrasound-guided biopsy of the LEFT breast mass at the 11 o'clock axis. Specimen container labeled site 2. Coil shaped clip placed at the biopsy site. No apparent complications. Electronically Signed   By: Franki Cabot M.D.   On: 12/29/2020 11:54   Result Date: 12/30/2020 CLINICAL DATA:  Patient with suspicious  architectural distortion within the upper LEFT breast, 11-1 o'clock axis, presents today for ultrasound-guided biopsies of corresponding masses at the 11 o'clock and 1 o'clock axes. EXAM: ULTRASOUND GUIDED LEFT BREAST CORE NEEDLE BIOPSY x2 COMPARISON:  Previous exam(s). PROCEDURE: I met with the patient and we discussed the procedure of ultrasound-guided biopsy, including benefits and alternatives. We discussed the high likelihood of a successful procedure. We discussed the risks of  the procedure, including infection, bleeding, tissue injury, clip migration, and inadequate sampling. Informed written consent was given. The usual time-out protocol was performed immediately prior to the procedure. Site 1: Lesion quadrant: Upper inner quadrant Using sterile technique and 1% Lidocaine as local anesthetic, under direct ultrasound visualization, a 12 gauge spring-loaded device was used to perform biopsy of the LEFT breast mass at the 11 o'clock axis using a lateral approach. At the conclusion of the procedure ribbon shaped tissue marker clip was deployed into the biopsy cavity. Site 1: Lesion quadrant: Upper outer quadrant Using sterile technique and 1% Lidocaine as local anesthetic, under direct ultrasound visualization, a 12 gauge spring-loaded device was used to perform biopsy of the LEFT breast mass at the 1 o'clock axis using a lateral approach. At the conclusion of the procedure coil shaped tissue marker clip was deployed into the biopsy cavity. Follow up 2 view mammogram was performed and dictated separately. IMPRESSION: 1. Ultrasound guided biopsy of the LEFT breast mass at the 11 o'clock axis. Specimen container labeled site # 1. Ribbon shaped clip placed at the biopsy site. No apparent complications. 2. Ultrasound guided biopsy of the LEFT breast mass at the 1 o'clock axis. Specimen container labeled site # 2. Coil shaped clip placed at the biopsy site. No apparent complications. Electronically Signed: By: Franki Cabot M.D. On: 12/29/2020 11:28  [place] LT BREAST BX W LOC DEV EA ADD LESION IMG BX SPEC US GUIDE  Addendum Date: 12/30/2020   ADDENDUM REPORT: 12/30/2020 14:33 ADDENDUM: Pathology revealed GRADE II INVASIVE MAMMARY CARCINOMA of the LEFT breast, 1:00 o'clock, ribbon clip this was found to be concordant by Dr. Franki Cabot. Pathology revealed GRADE II INVASIVE MAMMARY CARCINOMA of the LEFT breast, 11:00 o'clock, coil clip. This was found to be concordant by Dr. Franki Cabot. Pathology results were discussed with the patient by telephone. The patient reported doing well after the biopsies with tenderness at the sites. Post biopsy instructions and care were reviewed and questions were answered. The patient was encouraged to call [facility] for any additional concerns. Surgical consultation has been arranged with Dr. Christie Beckers at [facility] on January 04, 2021. If breast conservation is considered, recommend breast MRI to further define extent of disease given the 4 cm extent of distortion in the upper LEFT breast. Pathology results reported by Stacie Acres RN on 12/30/2020. Electronically Signed   By: Franki Cabot M.D.   On: 12/30/2020 14:33   Addendum Date: 12/29/2020   ADDENDUM REPORT: 12/29/2020 13:27 ADDENDUM: Additional images have been sent to PACS which properly demonstrate the appropriate location and sequence of today's ultrasound-guided biopsies. Electronically Signed   By: Franki Cabot M.D.   On: 12/29/2020 13:27   Addendum Date: 12/29/2020   ADDENDUM REPORT: 12/29/2020 11:54 ADDENDUM: Error in report. The locations for the biopsies are reversed in the original report and will be now corrected as follows: Site 1: Lesion quadrant: Upper outer quadrant Using sterile technique and 1 percent lidocaine as local anesthetic, under direct ultrasound visualization, a 12 gauge spring-loaded device was used to perform biopsy of the LEFT breast mass at the 1 o'clock  axis using a lateral approach. At the conclusion of the procedure, a ribbon shaped clip was placed at the biopsy site. Site 2: Lesion quadrant: Upper inner quadrant Using sterile technique and 1 percent lidocaine as local anesthetic, and direct ultrasound visualization, a 12 gauge spring-loaded device was used perform biopsy of the LEFT breast  mass at the 11 o'clock axis using a lateral approach. At the conclusion of the procedure, a coil shaped clip was placed at the biopsy site. Impression: 1. Ultrasound-guided biopsy of the LEFT breast mass at the 1 o'clock axis. Specimen container labeled site 1. Ribbon shaped clip placed at the biopsy site. No apparent complications. 2. Ultrasound-guided biopsy of the LEFT breast mass at the 11 o'clock axis. Specimen container labeled site 2. Coil shaped clip placed at the biopsy site. No apparent complications. Electronically Signed   By: Franki Cabot M.D.   On: 12/29/2020 11:54   Result Date: 12/30/2020 CLINICAL DATA:  Patient with suspicious architectural distortion within the upper LEFT breast, 11-1 o'clock axis, presents today for ultrasound-guided biopsies of corresponding masses at the 11 o'clock and 1 o'clock axes. EXAM: ULTRASOUND GUIDED LEFT BREAST CORE NEEDLE BIOPSY x2 COMPARISON:  Previous exam(s). PROCEDURE: I met with the patient and we discussed the procedure of ultrasound-guided biopsy, including benefits and alternatives. We discussed the high likelihood of a successful procedure. We discussed the risks of the procedure, including infection, bleeding, tissue injury, clip migration, and inadequate sampling. Informed written consent was given. The usual time-out protocol was performed immediately prior to the procedure. Site 1: Lesion quadrant: Upper inner quadrant Using sterile technique and 1% Lidocaine as local anesthetic, under direct ultrasound visualization, a 12 gauge spring-loaded device was used to perform biopsy of the LEFT breast mass at the 11  o'clock axis using a lateral approach. At the conclusion of the procedure ribbon shaped tissue marker clip was deployed into the biopsy cavity. Site 1: Lesion quadrant: Upper outer quadrant Using sterile technique and 1% Lidocaine as local anesthetic, under direct ultrasound visualization, a 12 gauge spring-loaded device was used to perform biopsy of the LEFT breast mass at the 1 o'clock axis using a lateral approach. At the conclusion of the procedure coil shaped tissue marker clip was deployed into the biopsy cavity. Follow up 2 view mammogram was performed and dictated separately. IMPRESSION: 1. Ultrasound guided biopsy of the LEFT breast mass at the 11 o'clock axis. Specimen container labeled site # 1. Ribbon shaped clip placed at the biopsy site. No apparent complications. 2. Ultrasound guided biopsy of the LEFT breast mass at the 1 o'clock axis. Specimen container labeled site # 2. Coil shaped clip placed at the biopsy site. No apparent complications. Electronically Signed: By: Franki Cabot M.D. On: 12/29/2020 11:28     IMPRESSION/PLAN: Left breast cancer, ER positive, staging pending MRI  She is not sure what her surgical options will be because an MRI is necessary for adequate staging and strategy.  Therefore today we talked about the role that postoperative radiation therapy can play after lumpectomy as well as mastectomy.  She understands that if she needs a mastectomy, radiation therapy may not be necessary.  This will depend upon the size of the mass, the final lymph node status, and the final margin status.  She understands that large masses, positive margins, and positive lymph nodes are potential indications for postoperative radiation therapy.  If she undergoes a lumpectomy, I will recommend radiation therapy for local control as the risk of a local recurrence will be relatively high without radiation.  It was a pleasure meeting the patient today. We discussed the risks, benefits, and side  effects of radiotherapy. We discussed that radiation would take approximately 4-6 weeks to complete and that I would give the patient a few weeks to heal following surgery before starting treatment planning.  If chemotherapy were to be given, this would precede radiotherapy. We spoke about acute effects including skin irritation and fatigue as well as much less common late effects including internal organ injury (such as to the lung or heart). We spoke about the latest technology that is used to minimize the risk of late effects for patients undergoing radiotherapy to the breast or chest wall. No guarantees of treatment were given. The patient is enthusiastic about proceeding with treatment. I look forward to participating in the patient's care.  Consent form has been signed today.  I will await her referral back to me for postoperative follow-up and eventual CT simulation/treatment planning as warranted.  On date of service, in total, I spent 60 minutes on this encounter. Patient was seen in person.   __________________________________________   Eppie Gibson, MD  This document serves as a record of services personally performed by Eppie Gibson, MD. It was created on her behalf by Roney Mans, a trained medical scribe. The creation of this record is based on the scribe's personal observations and the provider's statements to them. This document has been checked and approved by the attending provider.

## 2021-01-11 NOTE — Progress Notes (Signed)
Location of Breast Cancer:  Malignant neoplasm of upper-outer quadrant of left breast, estrogen receptor positive  Histology per Pathology Report:  (Definitive pathology pending surgery) Diagnosis 1. Breast, left, needle core biopsy, 1 o'clock - INVASIVE MAMMARY CARCINOMA - SEE COMMENT 2. Breast, left, needle core biopsy, 11 o'clock - INVASIVE MAMMARY CARCINOMA - SEE COMMENT Microscopic Comment 1. and 2. The biopsy material shows an infiltrative proliferation of cells with large vesicular nuclei with inconspicuous nucleoli, arranged linearly and in small clusters. Based on the biopsy, the carcinoma appears Nottingham grade 2 of 3 and measures 1.3 cm in greatest linear extent.  Receptor Status: ER(95%), PR (95%), Her2-neu (Negative via [place]), Ki-67(5%)  Past/Anticipated interventions by surgeon, if any:  01/04/2021 Dr. Erroll Luna (office visit) The patient is a strong desire for breast conserving surgery. If this is indeed larger than 5 cm that may be difficult and she may require a mastectomy. It is upper outer quadrant therefore we might be able to do an extended lumpectomy with 2 seeds. Once her MRI is done we can define surgical therapy The procedure has been discussed with the patient. Alternatives to surgery have been discussed with the patient. Risks of surgery include bleeding, Infection, Seroma formation, death, and the need for further surgery. Refer to medical and radiation oncology for opinions and then once MRI is done the fine surgical plan  Past/Anticipated interventions by medical oncology, if any:  Scheduled for consultation with Dr. Nicholas Lose on 01/21/2021  Lymphedema issues, if any:  Patient denies    Pain issues, if any:  Patient denies   SAFETY ISSUES: Prior radiation? No Pacemaker/ICD? No (does have an Inspire sleep apnea implanted device) Possible current pregnancy? No--hysterectomy Is the patient on methotrexate? No  Current Complaints / other  details:  Waiting to hear back from the [facility] about when her breast MRI will be scheduled

## 2021-01-12 ENCOUNTER — Encounter: Payer: Self-pay | Admitting: Radiation Oncology

## 2021-01-12 ENCOUNTER — Telehealth: Payer: Self-pay | Admitting: Internal Medicine

## 2021-01-12 ENCOUNTER — Telehealth: Payer: Self-pay | Admitting: Neurology

## 2021-01-12 ENCOUNTER — Other Ambulatory Visit: Payer: Self-pay

## 2021-01-12 ENCOUNTER — Ambulatory Visit
Admission: RE | Admit: 2021-01-12 | Discharge: 2021-01-12 | Disposition: A | Discharge status: Home / Self Care | Payer: PPO | Source: Ambulatory Visit | Attending: Radiation Oncology | Admitting: Radiation Oncology

## 2021-01-12 VITALS — BP 128/91 | HR 90 | Temp 97.1°F | Resp 20 | Wt 236.0 lb

## 2021-01-12 DIAGNOSIS — Z79899 Other long term (current) drug therapy: Secondary | ICD-10-CM | POA: Insufficient documentation

## 2021-01-12 DIAGNOSIS — G4730 Sleep apnea, unspecified: Secondary | ICD-10-CM | POA: Insufficient documentation

## 2021-01-12 DIAGNOSIS — Z170 Estrogen receptor positive status [ER+]: Secondary | ICD-10-CM

## 2021-01-12 DIAGNOSIS — C50412 Malignant neoplasm of upper-outer quadrant of left female breast: Secondary | ICD-10-CM | POA: Diagnosis not present

## 2021-01-12 DIAGNOSIS — Z800 Family history of malignant neoplasm of digestive organs: Secondary | ICD-10-CM | POA: Insufficient documentation

## 2021-01-12 DIAGNOSIS — D689 Coagulation defect, unspecified: Secondary | ICD-10-CM | POA: Insufficient documentation

## 2021-01-12 DIAGNOSIS — E079 Disorder of thyroid, unspecified: Secondary | ICD-10-CM | POA: Insufficient documentation

## 2021-01-12 DIAGNOSIS — I480 Paroxysmal atrial fibrillation: Secondary | ICD-10-CM | POA: Insufficient documentation

## 2021-01-12 DIAGNOSIS — Z7901 Long term (current) use of anticoagulants: Secondary | ICD-10-CM | POA: Insufficient documentation

## 2021-01-12 DIAGNOSIS — K5900 Constipation, unspecified: Secondary | ICD-10-CM | POA: Insufficient documentation

## 2021-01-12 DIAGNOSIS — R609 Edema, unspecified: Secondary | ICD-10-CM | POA: Diagnosis not present

## 2021-01-12 DIAGNOSIS — K219 Gastro-esophageal reflux disease without esophagitis: Secondary | ICD-10-CM | POA: Insufficient documentation

## 2021-01-12 DIAGNOSIS — I739 Peripheral vascular disease, unspecified: Secondary | ICD-10-CM | POA: Insufficient documentation

## 2021-01-12 NOTE — Telephone Encounter (Signed)
Pt called, would like a call from the nurse to discuss getting an MRI.

## 2021-01-12 NOTE — Telephone Encounter (Signed)
Patient called to see if she could be prescribed an antiacid. Patient refused appointment because she has too many other appointments. She stated if she couldn't get prescribed something to help she would just buy something over the counter, but wanted to ask first.      Please advise

## 2021-01-12 NOTE — Telephone Encounter (Signed)
Called and LVM for pt to call back to get further information. We follow her for OSA/inspire.   After reviewing her chart notes, appears she was recently dx w/ breast cx, may need inspire device info for MRI bilateral breast that they have ordered for her to complete? Spoke with Meagan in sleep lab. Inspire rep, Gerald Stabs coming to our office this afternoon and she will discuss w/ him as well. Pt should have device info/card to be able to give to MRI schedulers.  Meagan Zenia Resides was also going to call pt today to f/u with her on inspire. She would like to be messaged to speak w/ pt after if pt calls back.

## 2021-01-13 ENCOUNTER — Encounter: Payer: Self-pay | Admitting: Neurology

## 2021-01-13 NOTE — Telephone Encounter (Signed)
Patient is aware 

## 2021-01-13 NOTE — Telephone Encounter (Signed)
Spoke w/ Shelly A. She will call pt today

## 2021-01-13 NOTE — Telephone Encounter (Signed)
Sent message to Meagan in sleep lab to see if she was able to speak w/ Gerald Stabs (inspire rep) yesterday about pt/get phone#. Waiting on response.

## 2021-01-14 ENCOUNTER — Other Ambulatory Visit (HOSPITAL_COMMUNITY): Payer: Self-pay | Admitting: Internal Medicine

## 2021-01-14 ENCOUNTER — Telehealth: Payer: Self-pay | Admitting: Internal Medicine

## 2021-01-14 DIAGNOSIS — N6321 Unspecified lump in the left breast, upper outer quadrant: Secondary | ICD-10-CM

## 2021-01-14 NOTE — Telephone Encounter (Signed)
Okay for verbal orders. 

## 2021-01-14 NOTE — Telephone Encounter (Signed)
Verbal orders for breast MRI given to [facility].

## 2021-01-14 NOTE — Telephone Encounter (Signed)
[name] with [facility] mri department is calling and can take verbal for the patient to have Breast MRI with and with contrast.

## 2021-01-15 ENCOUNTER — Encounter: Payer: Self-pay | Admitting: Neurology

## 2021-01-20 ENCOUNTER — Other Ambulatory Visit: Payer: Self-pay

## 2021-01-20 ENCOUNTER — Ambulatory Visit (HOSPITAL_COMMUNITY)
Admission: RE | Admit: 2021-01-20 | Discharge: 2021-01-20 | Disposition: A | Discharge status: Home / Self Care | Payer: PPO | Source: Ambulatory Visit | Attending: Internal Medicine | Admitting: Internal Medicine

## 2021-01-20 DIAGNOSIS — N6321 Unspecified lump in the left breast, upper outer quadrant: Secondary | ICD-10-CM | POA: Diagnosis not present

## 2021-01-20 DIAGNOSIS — C50412 Malignant neoplasm of upper-outer quadrant of left female breast: Secondary | ICD-10-CM | POA: Diagnosis not present

## 2021-01-20 MED ORDER — GADOBUTROL 1 MMOL/ML IV SOLN
10.0000 mL | Freq: Once | INTRAVENOUS | Status: AC | PRN
  Administered 2021-01-20: 10 mL via INTRAVENOUS

## 2021-01-20 NOTE — Progress Notes (Signed)
[facility] NOTE  Patient Care Team: Isaac Bliss, Rayford Halsted, MD as PCP - General (Internal Medicine) Constance Haw, MD as PCP - Electrophysiology (Cardiology) Debara Pickett Nadean Corwin, MD as Consulting Physician (Cardiology) Brien Few, MD as Consulting Physician (Obstetrics and Gynecology) Renda Rolls, Jennefer Bravo, MD as Referring Physician (Dermatology) Ardis Hughs, MD as Attending Physician (Urology)  CHIEF COMPLAINTS/PURPOSE OF CONSULTATION:  Newly diagnosed left breast cancer  HISTORY OF PRESENTING ILLNESS:  [name] 70 y.o. female is here because of recent diagnosis of invasive mammary carcinoma of the left breast. Screening mammogram on 12/02/2020 showed 2 possible areas of distortion in the left breast. Diagnostic mammogram and [place] on 12/24/2020 showed large suspicious area of distortion within the superior left breast. Biopsy on 12/29/2020 showed invasive mammary carcinoma Her2-, ER/PR+ (95%). She presents to the clinic today for initial evaluation and discussion of treatment options.   I reviewed her records extensively and collaborated the history with the patient.  SUMMARY OF ONCOLOGIC HISTORY: Oncology History  Malignant neoplasm of upper-outer quadrant of left breast in female, estrogen receptor positive ([place])  12/29/2020 Initial Diagnosis   Screening mammogram: 2 possible areas of distortion in the left breast 1o clock 11 O Clock. Diagnostic mammogram and [place]: large suspicious area of distortion within the superior left breast.  Both biopsies: Grade 2 IDC ER 95%, PR 95%, Ki-67 5%, HER2 equivocal by IHC, FISH negative ratio 1.08 and copy #1.95     MEDICAL HISTORY:  Past Medical History:  Diagnosis Date   Chronic edema    Clotting disorder (HCC)    Constipation    Difficult intubation    pt states that her neck needs to be in a neutral position,  scoliosisand herniated dics in neck   Family history of colon cancer    Family  history of uterine cancer    Gallstones    GERD (gastroesophageal reflux disease)    protonix, hx h. pylori   Herniated disc, cervical    History of hiatal hernia    History of sebaceous adenoma    History of uterine cancer 04/22/2014   sees Dr. 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 Renda Rolls in dermatology right upper arm   Obesity    OSA on CPAP    uses CPAP   Paroxysmal atrial fibrillation (HCC)    Peripheral vascular disease (HCC)    PONV (postoperative nausea and vomiting)    Recurrent UTI    S/P hip replacement    Scoliosis    Sleep apnea    Thyroid nodule    Urinary incontinence    Uterine cancer ([place])    Stage 1   Venous insufficiency    s/p ablation    SURGICAL HISTORY: Past Surgical History:  Procedure Laterality Date   ABDOMINAL HYSTERECTOMY  09/10/2013   ATRIAL FIBRILLATION ABLATION N/A 08/09/2019   Procedure: ATRIAL FIBRILLATION ABLATION;  Surgeon: Constance Haw, MD;  Location: [facility];  Service: Cardiovascular;  Laterality: N/A;   BUNIONECTOMY  10/1976   CHOLECYSTECTOMY N/A 03/12/2019   Procedure: XI ROBOTIC ASSISTED CHOLECYSTECTOMY;  Surgeon: Clovis Riley, MD;  Location: [facility];  Service: General;  Laterality: N/A;   COLONOSCOPY     COLONOSCOPY WITH PROPOFOL N/A 01/17/2020   Procedure: COLONOSCOPY WITH PROPOFOL;  Surgeon: Jackquline Denmark, MD;  Location: [facility];  Service: Endoscopy;  Laterality:  N/A;   DRUG INDUCED ENDOSCOPY N/A 03/04/2020   Procedure: DRUG INDUCED ENDOSCOPY;  Surgeon: Melida Quitter, MD;  Location: [facility];  Service: ENT;  Laterality: N/A;   FRACTURE SURGERY  09/1964   floor of orbit right side   HIATAL HERNIA REPAIR     IMPLANTATION OF HYPOGLOSSAL NERVE STIMULATOR Right 04/15/2020   Procedure: IMPLANTATION OF HYPOGLOSSAL NERVE STIMULATOR;  Surgeon: Melida Quitter, MD;  Location: [facility];   Service: ENT;  Laterality: Right;   JOINT REPLACEMENT     bilateral hip replacements   MELANOMA EXCISION  12/2011   POLYPECTOMY  01/17/2020   Procedure: POLYPECTOMY;  Surgeon: Jackquline Denmark, MD;  Location: [facility];  Service: Endoscopy;;   TOTAL HIP ARTHROPLASTY Left 06/17/2014   Procedure: LEFT TOTAL HIP ARTHROPLASTY ANTERIOR APPROACH;  Surgeon: Mcarthur Rossetti, MD;  Location: [facility];  Service: Orthopedics;  Laterality: Left;   TOTAL HIP ARTHROPLASTY Right 09/02/2014   Procedure: RIGHT TOTAL HIP ARTHROPLASTY ANTERIOR APPROACH;  Surgeon: Mcarthur Rossetti, MD;  Location: [facility];  Service: Orthopedics;  Laterality: Right;   VEIN SURGERY  05/2011   venous ablation     SOCIAL HISTORY: Social History   Socioeconomic History   Marital status: Divorced    Spouse name: Not on file   Number of children: 2   Years of education: JD   Highest education level: Not on file  Occupational History   Occupation: lawyer/RETIRED  Tobacco Use   Smoking status: Never   Smokeless tobacco: Never  Vaping Use   Vaping Use: Never used  Substance and Sexual Activity   Alcohol use: Not Currently    Comment: rarely at [employer]    Drug use: No   Sexual activity: Not Currently    Birth control/protection: Surgical  Other Topics Concern   Not on file  Social History Narrative   Work or School: retired Forensic psychologist      Home Situation: lives alone - takes care of twin grandchildren 7 months in 05/2015      Spiritual Beliefs:       Lifestyle: active, healthy diet      Social Determinants of Health   Financial Resource Strain: Not on file  Food Insecurity: Not on file  Transportation Needs: Not on file  Physical Activity: Not on file  Stress: Not on file  Social Connections: Not on file  Intimate Partner Violence: Not on file    FAMILY HISTORY: Family History  Problem Relation Age of Onset   Uterine cancer Mother    Emphysema Mother    Diabetes Brother 85   Hypertension Father 44    Hyperlipidemia Father    Epilepsy Father 86   Colon cancer Father 15   Leukemia Father    Arthritis Sister 59   Cancer Maternal Grandmother        unk type possibly breast or skin   Other Paternal Grandmother        influenza    Stroke Paternal Grandfather    Cancer Paternal Uncle        unk type   Skin cancer Daughter        SCC on back   Esophageal cancer Neg Hx    Rectal cancer Neg Hx    Stomach cancer Neg Hx     ALLERGIES:  is allergic to hydrolyzed silk, gluten meal, and metoprolol.  MEDICATIONS:  Current Outpatient Medications  Medication Sig Dispense Refill   acetaminophen (TYLENOL) 500 MG tablet Take 2  tablets (1,000 mg total) by mouth every 6 (six) hours as needed.  0   Cranberry 500 MG TABS Take 1,000 mg by mouth 2 (two) times daily.     Cyanocobalamin (VITAMIN B 12) 500 MCG TABS Take 1 tablet by mouth daily.     diltiazem (CARDIZEM) 60 MG tablet Take 60 mg daily if needed for palpitations 30 tablet 9   diltiazem (TIAZAC) 240 MG 24 hr capsule TAKE 1 CAPSULE BY MOUTH EVERY DAY 90 capsule 2   ELIQUIS 5 MG TABS tablet TAKE 1 TABLET(5 MG) BY MOUTH TWICE DAILY 180 tablet 1   methenamine (HIPREX) 1 g tablet Take 1 g by mouth 2 (two) times daily.     MYRBETRIQ 50 MG TB24 tablet Take 50 mg by mouth daily.     omeprazole (PRILOSEC) 20 MG capsule Take 20 mg by mouth daily as needed.     OVER THE COUNTER MEDICATION Take 20 mg by mouth daily as needed (arthritis). CBD oil     polyethylene glycol (MIRALAX / GLYCOLAX) 17 g packet Take 17 g by mouth as needed.     sennosides-docusate sodium (SENOKOT-S) 8.6-50 MG tablet Take 1 tablet by mouth as needed for constipation.     spironolactone (ALDACTONE) 50 MG tablet TAKE 2 TABLETS BY MOUTH EVERY MORNING AND 1 TABLET EVERY EVENING 270 tablet 3   triamcinolone cream (KENALOG) 0.1 % Apply 1 application topically daily as needed (for dry skin).   1   vitamin C (ASCORBIC ACID) 500 MG tablet Take 500 mg by mouth 2 (two) times daily.      Vitamin D, Ergocalciferol, (DRISDOL) 1.25 MG (50000 UNIT) CAPS capsule Take 1 capsule (50,000 Units total) by mouth every 7 (seven) days for 12 doses. 12 capsule 0   No current facility-administered medications for this visit.    REVIEW OF SYSTEMS:   Constitutional: Denies fevers, chills or abnormal night sweats Eyes: Denies blurriness of vision, double vision or watery eyes Ears, nose, mouth, throat, and face: Denies mucositis or sore throat Respiratory: Denies cough, dyspnea or wheezes Cardiovascular: Denies palpitation, chest discomfort or lower extremity swelling Gastrointestinal:  Denies nausea, heartburn or change in bowel habits Skin: Denies abnormal skin rashes Lymphatics: Denies new lymphadenopathy or easy bruising Neurological:Denies numbness, tingling or new weaknesses Behavioral/Psych: Mood is stable, no new changes  Breast:  Denies any palpable lumps or discharge All other systems were reviewed with the patient and are negative.  PHYSICAL EXAMINATION: ECOG PERFORMANCE STATUS: 1 - Symptomatic but completely ambulatory  Vitals:   01/21/21 1252  BP: (!) 154/63  Pulse: 78  Resp: 18  Temp: 97.6 F (36.4 C)  SpO2: 98%   Filed Weights   01/21/21 1252  Weight: 235 lb 8 oz (106.8 kg)       LABORATORY DATA:  I have reviewed the data as listed Lab Results  Component Value Date   WBC 5.5 11/27/2020   HGB 13.8 11/27/2020   HCT 40.7 11/27/2020   MCV 102.7 (H) 11/27/2020   PLT 183.0 11/27/2020   Lab Results  Component Value Date   NA 138 11/27/2020   K 4.1 11/27/2020   CL 104 11/27/2020   CO2 25 11/27/2020    RADIOGRAPHIC STUDIES: I have personally reviewed the radiological reports and agreed with the findings in the report.  ASSESSMENT AND PLAN:  Malignant neoplasm of upper-outer quadrant of left breast in female, estrogen receptor positive ([place]) 12/29/2020:Screening mammogram: 2 possible areas of distortion in the left breast 1o clock (  1 cm) 11 O Clock  (0.9 cm) approximately 4.3 cm of breast tissue between the 2 masses. Diagnostic mammogram and [place]: large suspicious area of distortion within the superior left breast.  Both biopsies: Grade 2 IDC ER 95%, PR 95%, Ki-67 5%, HER2 equivocal by IHC, FISH negative ratio 1.08 and copy #1.95  Pathology and radiology counseling:Discussed with the patient, the details of pathology including the type of breast cancer,the clinical staging, the significance of ER, PR and HER-2/neu receptors and the implications for treatment. After reviewing the pathology in detail, we proceeded to discuss the different treatment options between surgery, radiation, chemotherapy, antiestrogen therapies.  Recommendations: 1. Breast conserving surgery versus mastectomy followed by 2. Oncotype DX testing to determine if chemotherapy would be of any benefit followed by 3. +/- Adjuvant radiation therapy 4. Adjuvant antiestrogen therapy  I reviewed the breast MRI and there was a contiguous area of hyperintense activity in the left breast.  Oncotype counseling: I discussed Oncotype DX test. I explained to the patient that this is a 21 gene panel to evaluate patient tumors DNA to calculate recurrence score. This would help determine whether patient has high risk or low risk breast cancer. She understands that if her tumor was found to be high risk, she would benefit from systemic chemotherapy. If low risk, no need of chemotherapy.  Return to clinic after surgery to discuss final pathology report and then determine if Oncotype DX testing will need to be sent.   All questions were answered. The patient knows to call the clinic with any problems, questions or concerns.   Rulon Eisenmenger, MD, MPH 01/21/2021    I, Thana Ates, am acting as scribe for Nicholas Lose, MD.  I have reviewed the above documentation for accuracy and completeness, and I agree with the above.

## 2021-01-21 ENCOUNTER — Inpatient Hospital Stay: Payer: PPO | Attending: Hematology and Oncology | Admitting: Hematology and Oncology

## 2021-01-21 DIAGNOSIS — Z8542 Personal history of malignant neoplasm of other parts of uterus: Secondary | ICD-10-CM | POA: Insufficient documentation

## 2021-01-21 DIAGNOSIS — Z170 Estrogen receptor positive status [ER+]: Secondary | ICD-10-CM | POA: Insufficient documentation

## 2021-01-21 DIAGNOSIS — Z7901 Long term (current) use of anticoagulants: Secondary | ICD-10-CM | POA: Diagnosis not present

## 2021-01-21 DIAGNOSIS — C50412 Malignant neoplasm of upper-outer quadrant of left female breast: Secondary | ICD-10-CM | POA: Diagnosis not present

## 2021-01-21 NOTE — Assessment & Plan Note (Signed)
12/29/2020:Screening mammogram: 2 possible areas of distortion in the left breast 1o clock (1 cm) 11 O Clock (0.9 cm) approximately 4.3 cm of breast tissue between the 2 masses. Diagnostic mammogram and [place]: large suspicious area of distortion within the superior left breast.  Both biopsies: Grade 2 IDC ER 95%, PR 95%, Ki-67 5%, HER2 equivocal by IHC, FISH negative ratio 1.08 and copy #1.95  Pathology and radiology counseling:Discussed with the patient, the details of pathology including the type of breast cancer,the clinical staging, the significance of ER, PR and HER-2/neu receptors and the implications for treatment. After reviewing the pathology in detail, we proceeded to discuss the different treatment options between surgery, radiation, chemotherapy, antiestrogen therapies.  Recommendations: 1. Breast conserving surgery followed by 2. Oncotype DX testing to determine if chemotherapy would be of any benefit followed by 3. Adjuvant radiation therapy followed by 4. Adjuvant antiestrogen therapy  Oncotype counseling: I discussed Oncotype DX test. I explained to the patient that this is a 21 gene panel to evaluate patient tumors DNA to calculate recurrence score. This would help determine whether patient has high risk or low risk breast cancer. She understands that if her tumor was found to be high risk, she would benefit from systemic chemotherapy. If low risk, no need of chemotherapy.  Return to clinic after surgery to discuss final pathology report and then determine if Oncotype DX testing will need to be sent.

## 2021-01-22 ENCOUNTER — Encounter: Payer: Self-pay | Admitting: *Deleted

## 2021-01-22 ENCOUNTER — Telehealth: Payer: Self-pay | Admitting: *Deleted

## 2021-01-22 NOTE — Telephone Encounter (Signed)
Spoke to pt, provided navigation resources and contact information. Denies questions or concerns regarding dx or treatment care plan. Encourage pt to call with needs or questions. Received verbal understanding.

## 2021-01-24 DIAGNOSIS — J101 Influenza due to other identified influenza virus with other respiratory manifestations: Secondary | ICD-10-CM | POA: Diagnosis not present

## 2021-01-25 ENCOUNTER — Ambulatory Visit: Payer: Self-pay | Admitting: Surgery

## 2021-01-25 DIAGNOSIS — C50912 Malignant neoplasm of unspecified site of left female breast: Secondary | ICD-10-CM

## 2021-01-27 ENCOUNTER — Telehealth: Payer: Self-pay | Admitting: *Deleted

## 2021-01-27 NOTE — Telephone Encounter (Signed)
   Pre-operative Risk Assessment    Patient Name: [name]  DOB: [birth year] MRN: [id number]      Request for Surgical Clearance   Procedure:   BREAST LUMPECTOMY  Date of Surgery: Clearance TBD                                 Surgeon:  DR. Erroll Luna Surgeon's Group or Practice Name:  [facility] Phone number:  [phone] Fax number:  [phone] ATTN: Carlene Coria, CMA   Type of Clearance Requested: - Medical  - Pharmacy:  Hold Apixaban (Eliquis)     Type of Anesthesia:   General    Additional requests/questions:   Jiles Prows   01/27/2021, 2:15 PM

## 2021-01-28 NOTE — Telephone Encounter (Signed)
Patient with diagnosis of atrial fibrillation on Eliquis for anticoagulation.    Procedure: breast lumpectomy Date of procedure: TBD   CHA2DS2-VASc Score = 3   This indicates a 3.2% annual risk of stroke. The patient's score is based upon: CHF History: 0 HTN History: 1 Diabetes History: 0 Stroke History: 0 Vascular Disease History: 0 Age Score: 1 Gender Score: 1   CrCl 79 (with adjusted body weight) Platelet count 183  Per office protocol, patient can hold Eliqius for 2 days prior to procedure.   Patient will not need bridging with Lovenox (enoxaparin) around procedure.

## 2021-01-28 NOTE — Telephone Encounter (Signed)
   Primary Cardiologist: Will Meredith Leeds, MD  Chart reviewed as part of pre-operative protocol coverage. Given past medical history and time since last visit, based on ACC/AHA guidelines, [name] would be at acceptable risk for the planned procedure without further cardiovascular testing.   Patient with diagnosis of atrial fibrillation on Eliquis for anticoagulation.     Procedure: breast lumpectomy Date of procedure: TBD     CHA2DS2-VASc Score = 3   This indicates a 3.2% annual risk of stroke. The patient's score is based upon: CHF History: 0 HTN History: 1 Diabetes History: 0 Stroke History: 0 Vascular Disease History: 0 Age Score: 1 Gender Score: 1   CrCl 79 (with adjusted body weight) Platelet count 183   Per office protocol, patient can hold Eliqius for 2 days prior to procedure.   Patient will not need bridging with Lovenox (enoxaparin) around procedure.  Patient was advised that if she develops new symptoms prior to surgery to contact our office to arrange a follow-up appointment.  She verbalized understanding.  I will route this recommendation to the requesting party via Epic fax function and remove from pre-op pool.  Please call with questions.  Jossie Ng. Prospero Mahnke NP-C    01/28/2021, 1:46 PM [facility] [address] Office [phone] Fax [phone]

## 2021-02-01 ENCOUNTER — Encounter: Payer: Self-pay | Admitting: *Deleted

## 2021-02-02 ENCOUNTER — Other Ambulatory Visit: Payer: Self-pay | Admitting: Surgery

## 2021-02-02 DIAGNOSIS — C50912 Malignant neoplasm of unspecified site of left female breast: Secondary | ICD-10-CM

## 2021-02-03 ENCOUNTER — Telehealth: Payer: Self-pay | Admitting: Hematology and Oncology

## 2021-02-03 NOTE — Telephone Encounter (Signed)
Scheduled per sch msg. Called and left msg  

## 2021-02-05 ENCOUNTER — Encounter: Payer: Self-pay | Admitting: Cardiology

## 2021-02-09 ENCOUNTER — Other Ambulatory Visit: Payer: Self-pay

## 2021-02-09 ENCOUNTER — Encounter (HOSPITAL_BASED_OUTPATIENT_CLINIC_OR_DEPARTMENT_OTHER): Payer: Self-pay | Admitting: Surgery

## 2021-02-10 ENCOUNTER — Telehealth: Payer: Self-pay | Admitting: Cardiology

## 2021-02-10 ENCOUNTER — Encounter (HOSPITAL_BASED_OUTPATIENT_CLINIC_OR_DEPARTMENT_OTHER)
Admission: RE | Admit: 2021-02-10 | Discharge: 2021-02-10 | Disposition: A | Discharge status: Home / Self Care | Payer: PPO | Source: Ambulatory Visit | Attending: Surgery | Admitting: Surgery

## 2021-02-10 DIAGNOSIS — Z8679 Personal history of other diseases of the circulatory system: Secondary | ICD-10-CM | POA: Diagnosis not present

## 2021-02-10 DIAGNOSIS — Z01818 Encounter for other preprocedural examination: Secondary | ICD-10-CM | POA: Insufficient documentation

## 2021-02-10 LAB — BASIC METABOLIC PANEL
Anion gap: 9 (ref 5–15)
BUN: 15 mg/dL (ref 8–23)
CO2: 23 mmol/L (ref 22–32)
Calcium: 9 mg/dL (ref 8.9–10.3)
Chloride: 104 mmol/L (ref 98–111)
Creatinine, Ser: 0.86 mg/dL (ref 0.44–1.00)
GFR, Estimated: >60 mL/min (ref >60)
Glucose, Bld: 91 mg/dL (ref 70–99)
Potassium: 4.5 mmol/L (ref 3.5–5.1)
Sodium: 136 mmol/L (ref 135–145)

## 2021-02-10 MED ORDER — CHLORHEXIDINE GLUCONATE CLOTH 2 % EX PADS: 6.0000 | MEDICATED_PAD | Freq: Once | CUTANEOUS | Status: DC

## 2021-02-10 NOTE — Telephone Encounter (Signed)
Attempted phone call to pt.  Per Epic ok to leave detailed message.  Left voicemail message advising pt she should keep her upcoming appointment as scheduled with Dr Curt Bears.  May call office at [phone] for further questions.

## 2021-02-10 NOTE — Progress Notes (Signed)

## 2021-02-10 NOTE — Telephone Encounter (Signed)
Follow Up:    Patient said she sent a message on My Chart and need to know asap please. She wants to know if she needs to keep her appointment with Dr Curt Bears, have not had any Afib since her Ablation.

## 2021-02-16 ENCOUNTER — Ambulatory Visit (HOSPITAL_BASED_OUTPATIENT_CLINIC_OR_DEPARTMENT_OTHER): Payer: PPO | Admitting: Certified Registered"

## 2021-02-16 ENCOUNTER — Ambulatory Visit (INDEPENDENT_AMBULATORY_CARE_PROVIDER_SITE_OTHER): Payer: PPO | Admitting: Cardiology

## 2021-02-16 ENCOUNTER — Ambulatory Visit
Admission: RE | Admit: 2021-02-16 | Discharge: 2021-02-16 | Disposition: A | Discharge status: Home / Self Care | Payer: PPO | Source: Ambulatory Visit | Attending: Surgery | Admitting: Surgery

## 2021-02-16 ENCOUNTER — Encounter: Payer: Self-pay | Admitting: Cardiology

## 2021-02-16 ENCOUNTER — Other Ambulatory Visit: Payer: Self-pay

## 2021-02-16 ENCOUNTER — Ambulatory Visit (HOSPITAL_BASED_OUTPATIENT_CLINIC_OR_DEPARTMENT_OTHER)
Admission: RE | Admit: 2021-02-16 | Discharge: 2021-02-16 | Disposition: A | Discharge status: Home / Self Care | Payer: PPO | Attending: Surgery | Admitting: Surgery

## 2021-02-16 ENCOUNTER — Encounter (HOSPITAL_BASED_OUTPATIENT_CLINIC_OR_DEPARTMENT_OTHER)
Admission: RE | Disposition: A | Discharge status: Home / Self Care | Payer: Self-pay | Source: Home / Self Care | Attending: Surgery

## 2021-02-16 ENCOUNTER — Encounter (HOSPITAL_BASED_OUTPATIENT_CLINIC_OR_DEPARTMENT_OTHER): Payer: Self-pay | Admitting: Surgery

## 2021-02-16 VITALS — BP 120/62 | HR 84 | Ht 68.0 in | Wt 230.0 lb

## 2021-02-16 DIAGNOSIS — C50912 Malignant neoplasm of unspecified site of left female breast: Secondary | ICD-10-CM

## 2021-02-16 DIAGNOSIS — C50412 Malignant neoplasm of upper-outer quadrant of left female breast: Secondary | ICD-10-CM | POA: Diagnosis not present

## 2021-02-16 DIAGNOSIS — C773 Secondary and unspecified malignant neoplasm of axilla and upper limb lymph nodes: Secondary | ICD-10-CM | POA: Insufficient documentation

## 2021-02-16 DIAGNOSIS — Z8679 Personal history of other diseases of the circulatory system: Secondary | ICD-10-CM

## 2021-02-16 DIAGNOSIS — I480 Paroxysmal atrial fibrillation: Secondary | ICD-10-CM

## 2021-02-16 DIAGNOSIS — Z170 Estrogen receptor positive status [ER+]: Secondary | ICD-10-CM | POA: Insufficient documentation

## 2021-02-16 DIAGNOSIS — G8918 Other acute postprocedural pain: Secondary | ICD-10-CM | POA: Diagnosis not present

## 2021-02-16 HISTORY — PX: BREAST LUMPECTOMY WITH RADIOACTIVE SEED AND SENTINEL LYMPH NODE BIOPSY: SHX6550

## 2021-02-16 SURGERY — BREAST LUMPECTOMY WITH RADIOACTIVE SEED AND SENTINEL LYMPH NODE BIOPSY
Anesthesia: Regional | Site: Breast | Laterality: Left

## 2021-02-16 MED ORDER — CEFAZOLIN SODIUM-DEXTROSE 2-4 GM/100ML-% IV SOLN
INTRAVENOUS | Status: AC
  Filled 2021-02-16: qty 100

## 2021-02-16 MED ORDER — SODIUM CHLORIDE 0.9 % IV SOLN
INTRAVENOUS | Status: AC
  Filled 2021-02-16: qty 10

## 2021-02-16 MED ORDER — ACETAMINOPHEN 500 MG PO TABS
ORAL_TABLET | ORAL | Status: AC
  Filled 2021-02-16: qty 2

## 2021-02-16 MED ORDER — PROPOFOL 10 MG/ML IV BOLUS
INTRAVENOUS | Status: DC | PRN
  Administered 2021-02-16: 150 mg via INTRAVENOUS

## 2021-02-16 MED ORDER — DEXAMETHASONE SODIUM PHOSPHATE 10 MG/ML IJ SOLN
INTRAMUSCULAR | Status: AC
  Filled 2021-02-16: qty 1

## 2021-02-16 MED ORDER — DEXAMETHASONE SODIUM PHOSPHATE 10 MG/ML IJ SOLN
INTRAMUSCULAR | Status: DC | PRN
  Administered 2021-02-16: 5 mg

## 2021-02-16 MED ORDER — BUPIVACAINE-EPINEPHRINE (PF) 0.25% -1:200000 IJ SOLN
INTRAMUSCULAR | Status: AC
  Filled 2021-02-16: qty 30

## 2021-02-16 MED ORDER — VANCOMYCIN HCL 500 MG IV SOLR
INTRAVENOUS | Status: AC
  Filled 2021-02-16: qty 10

## 2021-02-16 MED ORDER — FENTANYL CITRATE (PF) 100 MCG/2ML IJ SOLN
INTRAMUSCULAR | Status: AC
  Filled 2021-02-16: qty 2

## 2021-02-16 MED ORDER — ONDANSETRON HCL 4 MG/2ML IJ SOLN
INTRAMUSCULAR | Status: AC
  Filled 2021-02-16: qty 2

## 2021-02-16 MED ORDER — LIDOCAINE 2% (20 MG/ML) 5 ML SYRINGE
INTRAMUSCULAR | Status: AC
  Filled 2021-02-16: qty 5

## 2021-02-16 MED ORDER — OXYCODONE HCL 5 MG PO TABS: 5.0000 mg | ORAL_TABLET | Freq: Four times a day (QID) | ORAL | 0 refills | Status: DC | PRN

## 2021-02-16 MED ORDER — MIDAZOLAM HCL 2 MG/2ML IJ SOLN
INTRAMUSCULAR | Status: AC
  Filled 2021-02-16: qty 2

## 2021-02-16 MED ORDER — FENTANYL CITRATE (PF) 100 MCG/2ML IJ SOLN
INTRAMUSCULAR | Status: DC | PRN
  Administered 2021-02-16 (×2): 50 ug via INTRAVENOUS

## 2021-02-16 MED ORDER — FENTANYL CITRATE (PF) 100 MCG/2ML IJ SOLN: 25.0000 ug | INTRAMUSCULAR | Status: DC | PRN

## 2021-02-16 MED ORDER — ACETAMINOPHEN 500 MG PO TABS
1000.0000 mg | ORAL_TABLET | ORAL | Status: AC
  Administered 2021-02-16: 1000 mg via ORAL

## 2021-02-16 MED ORDER — SODIUM CHLORIDE 0.9 % IV SOLN: INTRAVENOUS | Status: DC | PRN

## 2021-02-16 MED ORDER — IBUPROFEN 800 MG PO TABS: 800.0000 mg | ORAL_TABLET | Freq: Three times a day (TID) | ORAL | 0 refills | Status: DC | PRN

## 2021-02-16 MED ORDER — PROPOFOL 10 MG/ML IV BOLUS
INTRAVENOUS | Status: AC
  Filled 2021-02-16: qty 20

## 2021-02-16 MED ORDER — DEXAMETHASONE SODIUM PHOSPHATE 4 MG/ML IJ SOLN
INTRAMUSCULAR | Status: DC | PRN
  Administered 2021-02-16: 10 mg via INTRAVENOUS

## 2021-02-16 MED ORDER — OXYCODONE HCL 5 MG/5ML PO SOLN: 5.0000 mg | Freq: Once | ORAL | Status: DC | PRN

## 2021-02-16 MED ORDER — CEFAZOLIN SODIUM-DEXTROSE 2-4 GM/100ML-% IV SOLN
2.0000 g | INTRAVENOUS | Status: AC
  Administered 2021-02-16: 2 g via INTRAVENOUS

## 2021-02-16 MED ORDER — MIDAZOLAM HCL 2 MG/2ML IJ SOLN
2.0000 mg | Freq: Once | INTRAMUSCULAR | Status: AC
  Administered 2021-02-16: 2 mg via INTRAVENOUS

## 2021-02-16 MED ORDER — LACTATED RINGERS IV SOLN: INTRAVENOUS | Status: DC

## 2021-02-16 MED ORDER — MAGTRACE LYMPHATIC TRACER
INTRAMUSCULAR | Status: DC | PRN
  Administered 2021-02-16: 3 mL via INTRAMUSCULAR

## 2021-02-16 MED ORDER — ROPIVACAINE HCL 5 MG/ML IJ SOLN
INTRAMUSCULAR | Status: DC | PRN
  Administered 2021-02-16: 30 mL via PERINEURAL

## 2021-02-16 MED ORDER — ONDANSETRON HCL 4 MG/2ML IJ SOLN
INTRAMUSCULAR | Status: DC | PRN
  Administered 2021-02-16: 4 mg via INTRAVENOUS

## 2021-02-16 MED ORDER — FENTANYL CITRATE (PF) 100 MCG/2ML IJ SOLN
100.0000 ug | Freq: Once | INTRAMUSCULAR | Status: AC
  Administered 2021-02-16: 50 ug via INTRAVENOUS

## 2021-02-16 MED ORDER — LIDOCAINE 2% (20 MG/ML) 5 ML SYRINGE
INTRAMUSCULAR | Status: DC | PRN
  Administered 2021-02-16: 40 mg via INTRAVENOUS

## 2021-02-16 MED ORDER — OXYCODONE HCL 5 MG PO TABS: 5.0000 mg | ORAL_TABLET | Freq: Once | ORAL | Status: DC | PRN

## 2021-02-16 MED ORDER — BUPIVACAINE-EPINEPHRINE (PF) 0.25% -1:200000 IJ SOLN
INTRAMUSCULAR | Status: DC | PRN
  Administered 2021-02-16: 26 mL via PERINEURAL

## 2021-02-16 SURGICAL SUPPLY — 51 items
ADH SKN CLS APL DERMABOND .7 (GAUZE/BANDAGES/DRESSINGS) ×1
APL PRP STRL LF DISP 70% ISPRP (MISCELLANEOUS) ×1
APPLIER CLIP 9.375 MED OPEN (MISCELLANEOUS) ×2
APR CLP MED 9.3 20 MLT OPN (MISCELLANEOUS) ×1
BINDER BREAST LRG (GAUZE/BANDAGES/DRESSINGS) IMPLANT
BINDER BREAST MEDIUM (GAUZE/BANDAGES/DRESSINGS) IMPLANT
BINDER BREAST XLRG (GAUZE/BANDAGES/DRESSINGS) IMPLANT
BINDER BREAST XXLRG (GAUZE/BANDAGES/DRESSINGS) IMPLANT
BLADE SURG 15 STRL LF DISP TIS (BLADE) ×1 IMPLANT
BLADE SURG 15 STRL SS (BLADE) ×2
CANISTER SUC SOCK COL 7IN (MISCELLANEOUS) IMPLANT
CANISTER SUCT 1200ML W/VALVE (MISCELLANEOUS) ×2 IMPLANT
CHLORAPREP W/TINT 26 (MISCELLANEOUS) ×2 IMPLANT
CLIP APPLIE 9.375 MED OPEN (MISCELLANEOUS) ×1 IMPLANT
COVER BACK TABLE 60X90IN (DRAPES) ×2 IMPLANT
COVER MAYO STAND STRL (DRAPES) ×2 IMPLANT
COVER PROBE W GEL 5X96 (DRAPES) ×2 IMPLANT
DECANTER SPIKE VIAL GLASS SM (MISCELLANEOUS) IMPLANT
DERMABOND ADVANCED (GAUZE/BANDAGES/DRESSINGS) ×1
DERMABOND ADVANCED .7 DNX12 (GAUZE/BANDAGES/DRESSINGS) ×1 IMPLANT
DRAPE LAPAROSCOPIC ABDOMINAL (DRAPES) ×2 IMPLANT
DRAPE UTILITY XL STRL (DRAPES) ×2 IMPLANT
ELECT COATED BLADE 2.86 ST (ELECTRODE) ×2 IMPLANT
ELECT REM PT RETURN 9FT ADLT (ELECTROSURGICAL) ×2
ELECTRODE REM PT RTRN 9FT ADLT (ELECTROSURGICAL) ×1 IMPLANT
GLOVE SRG 8 PF TXTR STRL LF DI (GLOVE) ×1 IMPLANT
GLOVE SURG LTX SZ8 (GLOVE) ×2 IMPLANT
GLOVE SURG UNDER POLY LF SZ8 (GLOVE) ×2
GOWN STRL REUS W/ TWL LRG LVL3 (GOWN DISPOSABLE) ×2 IMPLANT
GOWN STRL REUS W/ TWL XL LVL3 (GOWN DISPOSABLE) ×1 IMPLANT
GOWN STRL REUS W/TWL LRG LVL3 (GOWN DISPOSABLE) ×4
GOWN STRL REUS W/TWL XL LVL3 (GOWN DISPOSABLE) ×2
HEMOSTAT ARISTA ABSORB 3G PWDR (HEMOSTASIS) ×2 IMPLANT
HEMOSTAT SNOW SURGICEL 2X4 (HEMOSTASIS) IMPLANT
KIT MARKER MARGIN INK (KITS) ×2 IMPLANT
NDL SAFETY ECLIPSE 18X1.5 (NEEDLE) IMPLANT
NEEDLE HYPO 18GX1.5 SHARP (NEEDLE)
NEEDLE HYPO 25X1 1.5 SAFETY (NEEDLE) ×2 IMPLANT
NS IRRIG 1000ML POUR BTL (IV SOLUTION) ×2 IMPLANT
PACK BASIN DAY SURGERY FS (CUSTOM PROCEDURE TRAY) ×2 IMPLANT
PENCIL SMOKE EVACUATOR (MISCELLANEOUS) ×2 IMPLANT
SLEEVE SCD COMPRESS KNEE MED (STOCKING) ×2 IMPLANT
SPONGE T-LAP 4X18 ~~LOC~~+RFID (SPONGE) ×2 IMPLANT
SUT MNCRL AB 4-0 PS2 18 (SUTURE) ×2 IMPLANT
SUT VICRYL 3-0 CR8 SH (SUTURE) ×2 IMPLANT
SYR CONTROL 10ML LL (SYRINGE) ×2 IMPLANT
TOWEL GREEN STERILE FF (TOWEL DISPOSABLE) ×2 IMPLANT
TRACER MAGTRACE VIAL (MISCELLANEOUS) ×2 IMPLANT
TRAY FAXITRON CT DISP (TRAY / TRAY PROCEDURE) ×2 IMPLANT
TUBE CONNECTING 20X1/4 (TUBING) ×2 IMPLANT
YANKAUER SUCT BULB TIP NO VENT (SUCTIONS) ×2 IMPLANT

## 2021-02-16 NOTE — Anesthesia Procedure Notes (Signed)
Procedure Name: LMA Insertion Date/Time: 02/16/2021 3:46 PM Performed by: Ezequiel Kayser, CRNA Pre-anesthesia Checklist: Patient identified, Emergency Drugs available, Suction available and Patient being monitored Patient Re-evaluated:Patient Re-evaluated prior to induction Oxygen Delivery Method: Circle System Utilized Preoxygenation: Pre-oxygenation with 100% oxygen Induction Type: IV induction Ventilation: Mask ventilation without difficulty LMA: LMA inserted LMA Size: 4.0 Number of attempts: 1 Airway Equipment and Method: Bite block Placement Confirmation: positive ETCO2 Tube secured with: Tape Dental Injury: Teeth and Oropharynx as per pre-operative assessment

## 2021-02-16 NOTE — Patient Instructions (Signed)
Medication Instructions:  °Your physician recommends that you continue on your current medications as directed. Please refer to the Current Medication list given to you today. ° °*If you need a refill on your cardiac medications before your next appointment, please call your pharmacy* ° ° °Lab Work: °None ordered ° ° °Testing/Procedures: °None ordered ° ° °Follow-Up: °At [facility], you and your health needs are our priority.  As part of our continuing mission to provide you with exceptional heart care, we have created designated Provider Care Teams.  These Care Teams include your primary Cardiologist (physician) and Advanced Practice Providers (APPs -  Physician Assistants and Nurse Practitioners) who all work together to provide you with the care you need, when you need it. ° °Your next appointment:   °6 month(s) ° °The format for your next appointment:   °In Person ° °Provider:   °Will Camnitz, MD ° ° ° °Thank you for choosing [facility]!! ° ° °Aria Pickrell, RN °[phone] °  °

## 2021-02-16 NOTE — Anesthesia Preprocedure Evaluation (Addendum)
Anesthesia Evaluation  Patient identified by MRN, date of birth, ID band Patient awake    Reviewed: Allergy & Precautions, NPO status , Patient's Chart, lab work & pertinent test results  History of Anesthesia Complications (+) PONV and DIFFICULT AIRWAY  Airway Mallampati: I  TM Distance: >3 FB Neck ROM: Full    Dental no notable dental hx. (+) Teeth Intact, Dental Advisory Given   Pulmonary sleep apnea (has inspire) ,    Pulmonary exam normal breath sounds clear to auscultation       Cardiovascular + Peripheral Vascular Disease  Normal cardiovascular exam+ dysrhythmias Atrial Fibrillation  Rhythm:Regular Rate:Normal     Neuro/Psych negative neurological ROS  negative psych ROS   GI/Hepatic Neg liver ROS, hiatal hernia, GERD  ,  Endo/Other  negative endocrine ROS  Renal/GU negative Renal ROS  negative genitourinary   Musculoskeletal negative musculoskeletal ROS (+)   Abdominal   Peds  Hematology  (+) Blood dyscrasia (on eliquis), ,   Anesthesia Other Findings Last airway note: Easy mask ventilation. Grade 1 view with glidescope.   Reproductive/Obstetrics                            Anesthesia Physical Anesthesia Plan  ASA: 3  Anesthesia Plan: General and Regional   Post-op Pain Management: Regional block and Tylenol PO (pre-op)   Induction: Intravenous  PONV Risk Score and Plan: 4 or greater and Ondansetron, Dexamethasone, Midazolam and Treatment may vary due to age or medical condition  Airway Management Planned: LMA  Additional Equipment:   Intra-op Plan:   Post-operative Plan: Extubation in OR  Informed Consent: I have reviewed the patients History and Physical, chart, labs and discussed the procedure including the risks, benefits and alternatives for the proposed anesthesia with the patient or authorized representative who has indicated his/her understanding and acceptance.      Dental advisory given  Plan Discussed with: CRNA  Anesthesia Plan Comments:         Anesthesia Quick Evaluation

## 2021-02-16 NOTE — Anesthesia Procedure Notes (Signed)
Anesthesia Regional Block: Pectoralis block   Pre-Anesthetic Checklist: , timeout performed,  Correct Patient, Correct Site, Correct Laterality,  Correct Procedure, Correct Position, site marked,  Risks and benefits discussed,  Surgical consent,  Pre-op evaluation,  At surgeon's request and post-op pain management  Laterality: Left  Prep: Maximum Sterile Barrier Precautions used, chloraprep       Needles:  Injection technique: Single-shot  Needle Type: Echogenic Stimulator Needle     Needle Length: 9cm  Needle Gauge: 22     Additional Needles:   Procedures:,,,, ultrasound used (permanent image in chart),,    Narrative:  Start time: 02/16/2021 2:45 PM End time: 02/16/2021 2:49 PM Injection made incrementally with aspirations every 5 mL.  Performed by: Personally  Anesthesiologist: Freddrick March, MD  Additional Notes: Monitors applied. No increased pain on injection. No increased resistance to injection. Injection made in 5cc increments. Good needle visualization. Patient tolerated procedure well.

## 2021-02-16 NOTE — Progress Notes (Signed)
Electrophysiology Office Note   Date:  02/16/2021   ID:  [name], DOB 1950/08/22, MRN [id number]  PCP:  Isaac Bliss, Rayford Halsted, MD  Cardiologist: [facility] Primary Electrophysiologist:  Dr Curt Bears    CC: Evaluation for atrial fibrillation   History of Present Illness: [name] is a 70 y.o. female who is being seen today for the evaluation of atrial fibrillation at the request of Isaac Bliss, [place]*. Presenting today for electrophysiology evaluation.    She has a history significant for atrial fibrillation.  She did not initially diagnosed in 2012.  This was controlled with diltiazem.  December 2019 she was seen with multifocal pneumonia and atrial fibrillation with rapid rates.  She was started on diltiazem and flecainide.  She converted to sinus rhythm.  She was initially planned for atrial fibrillation ablation but was found to have a large hiatal hernia.  She is status post surgery.  She is now status post atrial fibrillation ablation 08/09/2019.  She was noted to have severe sleep apnea and is now on BiPAP.  Today, denies symptoms of palpitations, chest pain, shortness of breath, orthopnea, PND, lower extremity edema, claudication, dizziness, presyncope, syncope, bleeding, or neurologic sequela. The patient is tolerating medications without difficulties.  Since being seen she has done well.  She is noted no further episodes of atrial fibrillation.  She is overall happy with her control.  Unfortunately she was diagnosed with 2 breast masses.  She has plans for lumpectomy later today.   Past Medical History:  Diagnosis Date   Chronic edema    Clotting disorder (HCC)    Constipation    Difficult intubation    pt states that her neck needs to be in a neutral position,  scoliosisand herniated dics in neck   Family history of colon cancer    Family history of uterine cancer    Gallstones    GERD (gastroesophageal reflux disease)    protonix, hx h. pylori   Herniated  disc, cervical    History of hiatal hernia    History of sebaceous adenoma    History of uterine cancer 04/22/2014   sees Dr. 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 Renda Rolls in dermatology right upper arm   Obesity    OSA on CPAP    uses CPAP   Paroxysmal atrial fibrillation (HCC)    Peripheral vascular disease (HCC)    PONV (postoperative nausea and vomiting)    Recurrent UTI    S/P hip replacement    Scoliosis    Sleep apnea    Thyroid nodule    Urinary incontinence    Uterine cancer ([place])    Stage 1   Venous insufficiency    s/p ablation   Past Surgical History:  Procedure Laterality Date   ABDOMINAL HYSTERECTOMY  09/10/2013   ATRIAL FIBRILLATION ABLATION N/A 08/09/2019   Procedure: ATRIAL FIBRILLATION ABLATION;  Surgeon: Constance Haw, MD;  Location: [facility];  Service: Cardiovascular;  Laterality: N/A;   BUNIONECTOMY  10/1976   CHOLECYSTECTOMY N/A 03/12/2019   Procedure: XI ROBOTIC ASSISTED CHOLECYSTECTOMY;  Surgeon: Clovis Riley, MD;  Location: [facility];  Service: General;  Laterality: N/A;   COLONOSCOPY     COLONOSCOPY WITH PROPOFOL N/A 01/17/2020   Procedure: COLONOSCOPY WITH PROPOFOL;  Surgeon: Jackquline Denmark, MD;  Location: [facility];  Service: Endoscopy;  Laterality: N/A;  DRUG INDUCED ENDOSCOPY N/A 03/04/2020   Procedure: DRUG INDUCED ENDOSCOPY;  Surgeon: Melida Quitter, MD;  Location: [place];  Service: ENT;  Laterality: N/A;   FRACTURE SURGERY  09/1964   floor of orbit right side   HIATAL HERNIA REPAIR     IMPLANTATION OF HYPOGLOSSAL NERVE STIMULATOR Right 04/15/2020   Procedure: IMPLANTATION OF HYPOGLOSSAL NERVE STIMULATOR;  Surgeon: Melida Quitter, MD;  Location: [place];  Service: ENT;  Laterality: Right;   JOINT REPLACEMENT     bilateral hip replacements   MELANOMA EXCISION  12/2011   POLYPECTOMY  01/17/2020    Procedure: POLYPECTOMY;  Surgeon: Jackquline Denmark, MD;  Location: [facility];  Service: Endoscopy;;   TOTAL HIP ARTHROPLASTY Left 06/17/2014   Procedure: LEFT TOTAL HIP ARTHROPLASTY ANTERIOR APPROACH;  Surgeon: Mcarthur Rossetti, MD;  Location: [place];  Service: Orthopedics;  Laterality: Left;   TOTAL HIP ARTHROPLASTY Right 09/02/2014   Procedure: RIGHT TOTAL HIP ARTHROPLASTY ANTERIOR APPROACH;  Surgeon: Mcarthur Rossetti, MD;  Location: [place];  Service: Orthopedics;  Laterality: Right;   VEIN SURGERY  05/2011   venous ablation      Current Outpatient Medications  Medication Sig Dispense Refill   acetaminophen (TYLENOL) 500 MG tablet Take 2 tablets (1,000 mg total) by mouth every 6 (six) hours as needed.  0   Cranberry 500 MG TABS Take 1,000 mg by mouth 2 (two) times daily.     Cyanocobalamin (VITAMIN B 12) 500 MCG TABS Take 1 tablet by mouth daily.     diltiazem (CARDIZEM) 60 MG tablet Take 60 mg daily if needed for palpitations 30 tablet 9   diltiazem (TIAZAC) 240 MG 24 hr capsule TAKE 1 CAPSULE BY MOUTH EVERY DAY 90 capsule 2   ELIQUIS 5 MG TABS tablet TAKE 1 TABLET(5 MG) BY MOUTH TWICE DAILY 180 tablet 1   methenamine (HIPREX) 1 g tablet Take 1 g by mouth 2 (two) times daily.     MYRBETRIQ 50 MG TB24 tablet Take 50 mg by mouth daily.     omeprazole (PRILOSEC) 20 MG capsule Take 20 mg by mouth daily as needed.     OVER THE COUNTER MEDICATION Take 20 mg by mouth daily as needed (arthritis). CBD oil     polyethylene glycol (MIRALAX / GLYCOLAX) 17 g packet Take 17 g by mouth as needed.     sennosides-docusate sodium (SENOKOT-S) 8.6-50 MG tablet Take 1 tablet by mouth as needed for constipation.     spironolactone (ALDACTONE) 50 MG tablet TAKE 2 TABLETS BY MOUTH EVERY MORNING AND 1 TABLET EVERY EVENING 270 tablet 3   triamcinolone cream (KENALOG) 0.1 % Apply 1 application topically daily as needed (for dry skin).   1   vitamin C (ASCORBIC ACID) 500 MG tablet Take 500 mg by mouth 2 (two)  times daily.     Vitamin D, Ergocalciferol, (DRISDOL) 1.25 MG (50000 UNIT) CAPS capsule Take 1 capsule (50,000 Units total) by mouth every 7 (seven) days for 12 doses. 12 capsule 0   No current facility-administered medications for this visit.   Facility-Administered Medications Ordered in Other Visits  Medication Dose Route Frequency Provider Last Rate Last Admin   Chlorhexidine Gluconate Cloth 2 % PADS 6 each  6 each Topical Once Cornett, Marcello Moores, MD       And   Chlorhexidine Gluconate Cloth 2 % PADS 6 each  6 each Topical Once Cornett, Marcello Moores, MD        Allergies:  Hydrolyzed silk, Gluten meal, and Metoprolol   Social History:  The patient  reports that she has never smoked. She has never used smokeless tobacco. She reports that she does not currently use alcohol. She reports that she does not use drugs.   Family History:  The patient's family history includes Arthritis (age of onset: 94) in her sister; Cancer in her maternal grandmother and paternal uncle; Colon cancer (age of onset: 21) in her father; Diabetes (age of onset: 41) in her brother; Emphysema in her mother; Epilepsy (age of onset: 63) in her father; Hyperlipidemia in her father; Hypertension (age of onset: 75) in her father; Leukemia in her father; Other in her paternal grandmother; Skin cancer in her daughter; Stroke in her paternal grandfather; Uterine cancer in her mother.   ROS:  Please see the history of present illness.   Otherwise, review of systems is positive for none.   All other systems are reviewed and negative.   PHYSICAL EXAM: VS:  BP 120/62   Pulse 84   Ht 5\' 8"  (1.727 m)   Wt 230 lb (104.3 kg)   SpO2 96%   BMI 34.97 kg/m  , BMI Body mass index is 34.97 kg/m. GEN: Well nourished, well developed, in no acute distress  HEENT: normal  Neck: no JVD, carotid bruits, or masses Cardiac: RRR; no murmurs, rubs, or gallops,no edema  Respiratory:  clear to auscultation bilaterally, normal work of breathing GI:  soft, nontender, nondistended, + BS MS: no deformity or atrophy  Skin: warm and dry Neuro:  Strength and sensation are intact Psych: euthymic mood, full affect  EKG:  EKG is not ordered today. Personal review of the ekg ordered 02/10/21 shows sinus rhythm, rate 84  Recent Labs: 11/27/2020: ALT 27; Hemoglobin 13.8; Platelets 183.0; TSH 2.54 02/10/2021: BUN 15; Creatinine, Ser 0.86; Potassium 4.5; Sodium 136    Lipid Panel     Component Value Date/Time   CHOL 131 11/27/2020 0924   TRIG 72.0 11/27/2020 0924   HDL 46.20 11/27/2020 0924   CHOLHDL 3 11/27/2020 0924   VLDL 14.4 11/27/2020 0924   LDLCALC 71 11/27/2020 0924     Wt Readings from Last 3 Encounters:  02/16/21 230 lb (104.3 kg)  01/21/21 235 lb 8 oz (106.8 kg)  01/12/21 236 lb (107 kg)      Other studies Reviewed: Additional studies/ records that were reviewed today include: TTE 08/25/16  Review of the above records today demonstrates:  - Left ventricle: The cavity size was normal. Wall thickness was   increased in a pattern of mild LVH. Doppler parameters are   consistent with abnormal left ventricular relaxation (grade 1   diastolic dysfunction). - Left atrium:  The atrium was normal in size.   Myoview 08/25/17 Nuclear stress EF: 66%. There was no ST segment deviation noted during stress. The study is normal. This is a low risk study. The left ventricular ejection fraction is hyperdynamic (>65%).   Breast attenuation no ischemia or infarct EF 66%  ASSESSMENT AND PLAN:  1.  Paroxysmal atrial fibrillation/typical atrial flutter: Currently on Eliquis and diltiazem.  CHA2DS2-VASc of 3.  Status post ablation 08/09/2019.  Remains in sinus rhythm.  We [facility] [place] continue with current management.  2.  Obstructive sleep apnea: Found to be severe.  Has a hypoglossal nerve stimulator.  Comfortable with overall control.  No changes.  Current medicines are reviewed at length with the patient today.   The patient does not  have concerns regarding her medicines.  The following changes were made today: None  Labs/ tests ordered today include:  No orders of the defined types were placed in this encounter.     Disposition: Follow-up with [name] 6 months  Signed, Arkie Tagliaferro Meredith Leeds, MD  02/16/2021 10:55 AM     [facility] [address] [place] [address] [phone] (office) [phone] (fax)

## 2021-02-16 NOTE — Progress Notes (Signed)
   Assisted Dr. Woodrum with left, ultrasound guided, pectoralis block. Side rails up, monitors on throughout procedure. See vital signs in flow sheet. Tolerated Procedure well. 

## 2021-02-16 NOTE — Op Note (Signed)
Preoperative diagnosis:'s stage II left breast cancer upper outer quadrant  Postoperative diagnosis: Same  Procedure: Left breast seed localized lumpectomy with left axillary sentinel lymph node mapping using mag trace  Surgeon: Erroll Luna, MD  Anesthesia: LMA with left pectoral block and 0.25% Marcaine with epinephrine local  EBL: Approximately 40 cc  Drains: None  Specimen: Left breast tissue with seed and and clips x2 and 3 left axillary sentinel nodes all to pathology.  Additional margins also sent and all tissue was oriented with ink  IV fluids: Per anesthesia record  Indications for procedure: The patient is a 70 year old female with stage IIb left breast cancer.  Location left breast upper outer quadrant.  She breast conserving surgery after MRI revealed there is no additional disease.  There is a large tumor we discussed mastectomy as well as reconstruction.  She opted for lumpectomy since she desired to conserve her breast.The procedure has been discussed with the patient. Alternatives to surgery have been discussed with the patient.  Risks of surgery include bleeding,  Infection,  Seroma formation, death,  and the need for further surgery.   The patient understands and wishes to proceed. Sentinel lymph node mapping and dissection has been discussed with the patient.  Risk of bleeding,  Infection,  Seroma formation,  Additional procedures,,  Shoulder weakness ,  Shoulder stiffness,  Nerve and blood vessel injury and reaction to the mapping dyes have been discussed.  Alternatives to surgery have been discussed with the patient.  The patient agrees to proceed.   Of note 4 cc of mag tracer injected in the left breast after initial timeout in the holding area using sterile prep and quarter percent Marcaine local anesthesia.     Description of procedure: The patient was met in the holding area and questions were answered.  She underwent left pectoral block per anesthesia.  She was  then taken back to the operating room.  She was placed supine upon the OR table.  After induction of general anesthesia, left breast was prepped and draped in sterile fashion and timeout performed.  Proper patient, site and procedure were verified.  Her films were in the room and were available to review.  Of note she had 2 seeds placed in this area since there were 2 clips.  Incision made left breast upper outer quadrant.  Dissection was carried down all tissue around the large mass were excised with a grossly negative margin.  I did take additional margins down to the chest wall and these were all oriented with ink and sent to pathology.  The Faxitron image of the mass showed both seeds and both clips to be present in the mass.  Hemostasis was achieved after irrigation.  This was done with cautery.  Local anesthetic was infiltrated throughout the cavity.  It was then closed with a deep layer 3-0 Vicryl and 4 Monocryl was used to close the skin in a subcuticular fashion.   Probe was then used to identify the hotspot in the left axilla.  Incision made along the inferior border the axilla.  Dissection was carried down into the level 1 contents.  The trace was identified using the probe and there were 3 hot nodes removed that had taken up the tracer.  The background counts approached baseline.  There were no significant palpable nodes that I can feel.  The long thoracic nerve, thoracodorsal trunk and axillary vein were all preserved.  Irrigation was used.  Hemostasis achieved.  Arista placed in the  axilla.  The axilla was then closed with a deep layer 3-0 Vicryl and 4-0 Monocryl.  Dermabond applied.  Breast binder placed.  All counts were found to be correct.  The patient was awoke extubated taken to recovery in satisfactory condition.

## 2021-02-16 NOTE — Transfer of Care (Signed)
Immediate Anesthesia Transfer of Care Note  Patient: [name]  Procedure(s) Performed: LEFT BREAST LUMPECTOMY WITH RADIOACTIVE SEED X2 AND SENTINEL LYMPH NODE BIOPSY (Left: Breast)  Patient Location: PACU  Anesthesia Type:General and Regional  Level of Consciousness: drowsy  Airway & Oxygen Therapy: Patient Spontanous Breathing and Patient connected to face mask oxygen  Post-op Assessment: Report given to RN and Post -op Vital signs reviewed and stable  Post vital signs: Reviewed and stable  Last Vitals:  Vitals Value Taken Time  BP    Temp    Pulse 77 02/16/21 1715  Resp 14 02/16/21 1715  SpO2 96 % 02/16/21 1715  Vitals shown include unvalidated device data.  Last Pain:  Vitals:   02/16/21 1338  TempSrc: Oral  PainSc: 0-No pain      Patients Stated Pain Goal: 3 (85/69/43 7005)  Complications: No notable events documented.

## 2021-02-16 NOTE — H&P (Signed)
History of Present Illness: [name] is a 70 y.o. female who is seen today as an office consultation at the request of Dr. Isaac Bliss for evaluation of No chief complaint on file. .   Patient seen for evaluation of left breast cancer. She had 2 areas in the left breast upper outer quadrant that are about a centimeter each. 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 There is an area of density between the 2 areas on mammogram and this was concerning for extent of disease. No history of breast mass, nipple discharge or breast pain. No family history of breast cancer noted.  Review of Systems: A complete review of systems was obtained from the patient. I have reviewed this information and discussed as appropriate with the patient. See HPI as well for other ROS.    Medical History: Past Medical History:  Diagnosis Date   Arrhythmia   Melanoma (CMS-HCC)   Osteoarthritis   Rheumatoid arthritis(714.0) (CMS-HCC)   Sleep apnea   Uterine cancer (CMS-HCC)   Patient Active Problem List  Diagnosis   Cervical spondylosis without myelopathy   Lumbosacral spondylosis without myelopathy   Past Surgical History:  Procedure Laterality Date   COLPORRHAPHY FOR REPAIR CYSTOCELE ANTERIOR 09/10/2013   HYSTERECTOMY   MASTECTOMY   REPAIR ENTEROCELE ABDOMINAL    Allergies  Allergen Reactions   Silk Hives   Metoprolol Dizziness  dizzy dizzy dizzy dizzy   Current Outpatient Medications on File Prior to Visit  Medication Sig Dispense Refill   apixaban (ELIQUIS) 5 mg tablet Take 5 mg by mouth 2 (two) times daily   methenamine hippurate (HIPREX) 1 gram tablet Take 1 g by mouth 2 (two) times daily   mirabegron (MYRBETRIQ) 50 mg ER tablet Take by mouth   ascorbic acid, vitamin C, (VITAMIN C) 500 MG tablet Take by mouth   aspirin 325 MG tablet Take 325 mg by mouth once daily.   diltiazem (CARDIZEM CD) 240 MG  CD capsule Take 240 mg by mouth once daily. 5   ergocalciferol, vitamin D2, 1,250 mcg (50,000 unit) capsule TAKE 1 CAPSULE BY MOUTH EVERY 7 DAYS FOR 12 DOSES   flecainide (TAMBOCOR) 50 MG tablet Take 50 mg by mouth 2 (two) times daily. 5   HYDROcodone-acetaminophen (NORCO) 10-325 mg tablet Take 1 tablet by mouth every 6 (six) hours as needed for Pain.   ibuprofen (ADVIL,MOTRIN) 600 MG tablet Take 600 mg by mouth every 6 (six) hours as needed for Pain.   pantoprazole (PROTONIX) 40 MG DR tablet Take 40 mg by mouth once daily. 0   sennosides (SENOKOT) 8.6 mg tablet Take 1 tablet by mouth once daily.   spironolactone (ALDACTONE) 50 MG tablet Take 50 mg by mouth 2 (two) times daily. 0   No current facility-administered medications on file prior to visit.   Family History  Problem Relation Age of Onset   Osteoarthritis Mother   Uterine cancer Mother   Colon cancer Father   Seizures Father    Social History   Tobacco Use  Smoking Status Never Smoker  Smokeless Tobacco Never Used    Social History   Socioeconomic History   Marital status: Divorced  Tobacco Use   Smoking status: Never Smoker   Smokeless tobacco: Never Used  Scientific laboratory technician Use: Never used  Substance and Sexual Activity   Alcohol use: Yes   Drug use: Never   Objective:  Vitals:  01/04/21 1029  Pulse: 91  Temp: 36.3 C (97.3 F)  SpO2: 98%  Weight: (!) 108 kg (238 lb)  Height: 175.3 cm (5' 9" )   Body mass index is 35.15 kg/m.  Physical Exam Constitutional:  Appearance: Normal appearance.  HENT:  Head: Normocephalic.  Nose: Nose normal.  Eyes:  Pupils: Pupils are equal, round, and reactive to light.  Cardiovascular:  Rate and Rhythm: Normal rate.  Pulses: Normal pulses.  Pulmonary:  Effort: Pulmonary effort is normal.  Breath sounds: No stridor.  Chest:  Breasts:  Right: Normal. No swelling or bleeding.  Left: No mass.   Musculoskeletal:  Cervical back: Normal range of motion.   Lymphadenopathy:  Upper Body:  Right upper body: No supraclavicular or axillary adenopathy.  Left upper body: No supraclavicular or axillary adenopathy.  Neurological:  General: No focal deficit present.  Mental Status: She is alert and oriented to person, place, and time.  Psychiatric:  Mood and Affect: Mood normal.  Behavior: Behavior normal.     Labs, Imaging and Diagnostic Testing: ADDITIONAL INFORMATION: 1. PROGNOSTIC INDICATORS Results: IMMUNOHISTOCHEMICAL AND MORPHOMETRIC ANALYSIS PERFORMED MANUALLY The tumor cells are EQUIVOCAL for Her2 (2+). Her2 by FISH will be performed and results reported separately. Estrogen Receptor: 95%, POSITIVE, MODERATE STAINING INTENSITY Progesterone Receptor: 95%, POSITIVE, STRONG STAINING INTENSITY Proliferation Marker Ki67: 5% REFERENCE RANGE ESTROGEN RECEPTOR NEGATIVE 0% POSITIVE =>1% REFERENCE RANGE PROGESTERONE RECEPTOR NEGATIVE 0% POSITIVE =>1% All controls stained appropriately Thressa Sheller MD Pathologist, Electronic Signature ( Signed 12/31/2020) 1. E-cadherin is POSITIVE supporting a ductal origin. Thressa Sheller MD Pathologist, Electronic Signature ( Signed 12/31/2020) 1 of 3 FINAL for REIZEL, CALZADA T [phone]) FINAL DIAGNOSIS Diagnosis 1. Breast, left, needle core biopsy, 1 o'clock - INVASIVE MAMMARY CARCINOMA - SEE COMMENT 2. Breast, left, needle core biopsy, 11 o'clock - INVASIVE MAMMARY CARCINOMA - SEE COMMENT Microscopic Comment 1. and 2. The biopsy material shows an infiltrative proliferation of cells with large vesicular nuclei with inconspicuous nucleoli, arranged linearly and in small clusters. Based on the biopsy, the carcinoma appears Nottingham grade 2 of 3 and measures 1.3 cm in greatest linear extent. E-cadherin and prognostic markers (ER/PR/ki-67/HER2)are pending and will be reported in an addendum. Dr. Saralyn Pilar reviewed the case and agrees with the above diagnosis. These results were called to [facility] on December 31, 2018. Patient recalled from screening for left breast distortion.   EXAM: DIGITAL DIAGNOSTIC UNILATERAL LEFT MAMMOGRAM WITH TOMOSYNTHESIS AND CAD; ULTRASOUND LEFT BREAST LIMITED   TECHNIQUE: Left digital diagnostic mammography and breast tomosynthesis was performed. The images were evaluated with computer-aided detection.; Targeted ultrasound examination of the left breast was performed.   COMPARISON: Previous exam(s).   ACR Breast Density Category c: The breast tissue is heterogeneously dense, which may obscure small masses.   FINDINGS: There is a large area of distortion within the superior left breast measuring approximately 5 cm.   Targeted ultrasound is performed, showing a 1.0 x 1.0 x 0.8 cm irregular hypoechoic mass left breast 11 o'clock position 3 cm from nipple with surrounding posterior acoustic shadowing. Additionally within the left breast 1 o'clock position 4 cm from nipple there is a 0.9 x 0.7 x 0.9 cm irregular hypoechoic mass. There is approximately 4.3 cm of breast involvement between the 11 o'clock and 1 o'clock masses. There is abnormal appearing tissue intervening between the 2 masses.   No left axillary adenopathy.   IMPRESSION: Large suspicious area of distortion within the superior left breast with corresponding  masses on ultrasound.   RECOMMENDATION: Ultrasound-guided core needle biopsy of the 11 o'clock position mass and ultrasound-guided core needle biopsy of the 1 o'clock position mass.   I have discussed the findings and recommendations with the patient. If applicable, a reminder letter will be sent to the patient regarding the next appointment.   BI-RADS CATEGORY 4: Suspicious.     Electronically Signed By: Lovey Newcomer M.D. On: 12/24/2020 12:17   Assessment and Plan:  Diagnoses and all orders for this visit:  Malignant neoplasm of upper-outer quadrant of left breast in female, estrogen  receptor positive (CMS-HCC) - MRI breast bilateral with and without contrast; Future - Ambulatory Referral to Oncology-Medical - Ambulatory Referral to Radiation Oncology    MRI to evaluate extent  The patient is a strong desire for breast conserving surgery. If this is indeed larger than 5 cm that may be difficult and she may require a mastectomy. It is upper outer quadrant therefore we might be able to do an extended lumpectomy with 2 seeds. Once her MRI is done we can define surgical therapy  Discussed lumpectomy with sentinel node mapping. Discussed the pros and cons of surgery and the risks of bleeding, infection, cosmetic deformity, chronic pain, lymphedema, injury to major vascular structures and need for the treatments, reexcision and/or more surgery. Also discussed mastectomy limitations as well as flap necrosis and need for possible reconstruction if desired. Discussed postop recovery, pathophysiology of her disease and expected recovery and treatment.  The procedure has been discussed with the patient. Alternatives to surgery have been discussed with the patient. Risks of surgery include bleeding, Infection, Seroma formation, death, and the need for further surgery.      MRI confirms findings but breast conservation possible   Pt wishes to proceed with this plan understanding that mastectomy possible next step if this fails.  The procedure has been discussed with the patient. Alternatives to surgery have been discussed with the patient.  Risks of surgery include bleeding,  Infection,  Seroma formation, death,  and the need for further surgery.   The patient understands and wishes to proceed. Sentinel lymph node mapping and dissection has been discussed with the patient.  Risk of bleeding,  Infection,  Seroma formation,  Additional procedures,,  Shoulder weakness ,  Shoulder stiffness,  Nerve and blood vessel injury and reaction to the mapping dyes have been discussed.  Alternatives  to surgery have been discussed with the patient.  The patient agrees to proceed.

## 2021-02-16 NOTE — Discharge Instructions (Addendum)
[facility] Office Phone Number [phone]  BREAST BIOPSY/ PARTIAL MASTECTOMY: POST OP INSTRUCTIONS  Always review your discharge instruction sheet given to you by the facility where your surgery was performed.  IF YOU HAVE DISABILITY OR FAMILY LEAVE FORMS, YOU MUST BRING THEM TO THE OFFICE FOR PROCESSING.  DO NOT GIVE THEM TO YOUR DOCTOR.  A prescription for pain medication may be given to you upon discharge.  Take your pain medication as prescribed, if needed.  If narcotic pain medicine is not needed, then you may take acetaminophen (Tylenol) or ibuprofen (Advil) as needed. Take your usually prescribed medications unless otherwise directed If you need a refill on your pain medication, please contact your pharmacy.  They will contact our office to request authorization.  Prescriptions will not be filled after 5pm or on week-ends. You should eat very light the first 24 hours after surgery, such as soup, crackers, pudding, etc.  Resume your normal diet the day after surgery. Most patients will experience some swelling and bruising in the breast.  Ice packs and a good support bra will help.  Swelling and bruising can take several days to resolve.  It is common to experience some constipation if taking pain medication after surgery.  Increasing fluid intake and taking a stool softener will usually help or prevent this problem from occurring.  A mild laxative (Milk of Magnesia or Miralax) should be taken according to package directions if there are no bowel movements after 48 hours. Unless discharge instructions indicate otherwise, you may remove your bandages 24-48 hours after surgery, and you may shower at that time.  You may have steri-strips (small skin tapes) in place directly over the incision.  These strips should be left on the skin for 7-10 days.  If your surgeon used skin glue on the incision, you may shower in 24 hours.  The glue will flake off over the next 2-3 weeks.  Any  sutures or staples will be removed at the office during your follow-up visit. ACTIVITIES:  You may resume regular daily activities (gradually increasing) beginning the next day.  Wearing a good support bra or sports bra minimizes pain and swelling.  You may have sexual intercourse when it is comfortable. You may drive when you no longer are taking prescription pain medication, you can comfortably wear a seatbelt, and you can safely maneuver your car and apply brakes. RETURN TO WORK:  ______________________________________________________________________________________ Dennis Bast should see your doctor in the office for a follow-up appointment approximately two weeks after your surgery.  Your doctor's nurse will typically make your follow-up appointment when she calls you with your pathology report.  Expect your pathology report 2-3 business days after your surgery.  You may call to check if you do not hear from [place] after three days. OTHER INSTRUCTIONS: _______________________________________________________________________________________________ _____________________________________________________________________________________________________________________________________ _____________________________________________________________________________________________________________________________________ _____________________________________________________________________________________________________________________________________  WHEN TO CALL YOUR DOCTOR: Fever over 101.0 Nausea and/or vomiting. Extreme swelling or bruising. Continued bleeding from incision. Increased pain, redness, or drainage from the incision.  The clinic staff is available to answer your questions during regular business hours.  Please don't hesitate to call and ask to speak to one of the nurses for clinical concerns.  If you have a medical emergency, go to the nearest emergency room or call 911.  A surgeon from [facility] is always on call at the hospital.  For further questions, please visit [email or web address]          RESTART ELIQUIS IN 48 HOURS    No  Tylenol before 9:00pm if needed.   Post Anesthesia Home Care Instructions  Activity: Get plenty of rest for the remainder of the day. A responsible individual must stay with you for 24 hours following the procedure.  For the next 24 hours, DO NOT: -Drive a car -Paediatric nurse -Drink alcoholic beverages -Take any medication unless instructed by your physician -Make any legal decisions or sign important papers.  Meals: Start with liquid foods such as gelatin or soup. Progress to regular foods as tolerated. Avoid greasy, spicy, heavy foods. If nausea and/or vomiting occur, drink only clear liquids until the nausea and/or vomiting subsides. Call your physician if vomiting continues.  Special Instructions/Symptoms: Your throat may feel dry or sore from the anesthesia or the breathing tube placed in your throat during surgery. If this causes discomfort, gargle with warm salt water. The discomfort should disappear within 24 hours.  If you had a scopolamine patch placed behind your ear for the management of post- operative nausea and/or vomiting:  1. The medication in the patch is effective for 72 hours, after which it should be removed.  Wrap patch in a tissue and discard in the trash. Wash hands thoroughly with soap and water. 2. You may remove the patch earlier than 72 hours if you experience unpleasant side effects which may include dry mouth, dizziness or visual disturbances. 3. Avoid touching the patch. Wash your hands with soap and water after contact with the patch.

## 2021-02-17 NOTE — Anesthesia Postprocedure Evaluation (Signed)
Anesthesia Post Note  Patient: VERNELLA NIZNIK  Procedure(s) Performed: LEFT BREAST LUMPECTOMY WITH RADIOACTIVE SEED X2 AND SENTINEL LYMPH NODE BIOPSY (Left: Breast)     Patient location during evaluation: PACU Anesthesia Type: Regional and General Level of consciousness: awake and alert Pain management: pain level controlled Vital Signs Assessment: post-procedure vital signs reviewed and stable Respiratory status: spontaneous breathing, nonlabored ventilation, respiratory function stable and patient connected to nasal cannula oxygen Cardiovascular status: blood pressure returned to baseline and stable Postop Assessment: no apparent nausea or vomiting Anesthetic complications: no   No notable events documented.  Last Vitals:  Vitals:   02/16/21 1745 02/16/21 1845  BP: (!) 131/57 125/74  Pulse: 74 74  Resp: 12 16  Temp:  36.7 C  SpO2: 95% 95%    Last Pain:  Vitals:   02/17/21 0950  TempSrc:   PainSc: 2                  Finnlee Silvernail L Trenton Passow

## 2021-02-18 ENCOUNTER — Encounter (HOSPITAL_BASED_OUTPATIENT_CLINIC_OR_DEPARTMENT_OTHER): Payer: Self-pay | Admitting: Surgery

## 2021-02-18 LAB — SURGICAL PATHOLOGY

## 2021-02-19 ENCOUNTER — Encounter: Payer: Self-pay | Admitting: Surgery

## 2021-02-22 ENCOUNTER — Encounter: Payer: Self-pay | Admitting: *Deleted

## 2021-02-22 ENCOUNTER — Telehealth: Payer: Self-pay | Admitting: *Deleted

## 2021-02-22 NOTE — Telephone Encounter (Signed)
Ordered mammaprint per Dr. Lindi Adie. Faxed requisition to pathology and [facility]

## 2021-02-23 DIAGNOSIS — C50412 Malignant neoplasm of upper-outer quadrant of left female breast: Secondary | ICD-10-CM | POA: Diagnosis not present

## 2021-02-24 ENCOUNTER — Other Ambulatory Visit (INDEPENDENT_AMBULATORY_CARE_PROVIDER_SITE_OTHER): Payer: PPO

## 2021-02-24 DIAGNOSIS — E559 Vitamin D deficiency, unspecified: Secondary | ICD-10-CM | POA: Diagnosis not present

## 2021-02-24 LAB — VITAMIN D 25 HYDROXY (VIT D DEFICIENCY, FRACTURES): VITD: 37.62 ng/mL (ref 30.00–100.00)

## 2021-02-25 ENCOUNTER — Other Ambulatory Visit: Payer: Self-pay | Admitting: Internal Medicine

## 2021-02-25 ENCOUNTER — Encounter: Payer: Self-pay | Admitting: Internal Medicine

## 2021-02-25 DIAGNOSIS — E559 Vitamin D deficiency, unspecified: Secondary | ICD-10-CM

## 2021-02-25 MED ORDER — VITAMIN D (ERGOCALCIFEROL) 1.25 MG (50000 UNIT) PO CAPS
50000.0000 [IU] | ORAL_CAPSULE | ORAL | 0 refills | Status: AC
Start: 2021-02-25 — End: 2021-05-14

## 2021-02-26 DIAGNOSIS — N3000 Acute cystitis without hematuria: Secondary | ICD-10-CM | POA: Diagnosis not present

## 2021-03-02 NOTE — Assessment & Plan Note (Signed)
12/29/2020:Screening mammogram: 2 possible areas of distortion in the left breast 1o clock (1 cm) 11 O Clock (0.9 cm) approximately 4.3 cm of breast tissue between the 2 masses. Diagnostic mammogram and [place]: large suspicious area of distortion within the superior left breast.  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 I discussed the margins as well as lymph node surgeries. We also discussed the final staging along with previously performed ER/PR and HER-2/neu testing.  Treatment plan: 1.  MammaPrint testing to determine if she would benefit from chemotherapy 2. adjuvant radiation therapy 3.  Adjuvant antiestrogen therapy  Mammaprint counseling: [facility] is a prospective, randomized phase III controlled trial that investigates the clinical utility of MammaPrint, when compared to standard clinical pathological criteria, with 6,693 patients enrolled from over 111 institutions. Clinical high-risk patients with a Low Risk MammaPrint result, including 48% node-positive, had 5-year distant metastasis-free survival rate in excess of 94 percent, whether randomized to receive adjuvant chemotherapy or not proving MammaPrints ability to safely identify Low Risk patients.  Return to clinic based upon MammaPrint test result

## 2021-03-02 NOTE — Progress Notes (Signed)
Patient Care Team: [name], Shelly Halsted, MD as PCP - General (Internal Medicine) Shelly Haw, MD as PCP - Electrophysiology (Cardiology) Shelly Haw, MD as PCP - Cardiology (Cardiology) Shelly Pickett Nadean Corwin, MD as Consulting Physician (Cardiology) Shelly Few, MD as Consulting Physician (Obstetrics and Gynecology) [name], Shelly Bravo, MD as Referring Physician (Dermatology) Shelly Hughs, MD as Attending Physician (Urology) Shelly Kaufmann, RN as Oncology Nurse Navigator Shelly Germany, RN as Oncology Nurse Navigator  DIAGNOSIS:    ICD-10-CM   1. Malignant neoplasm of upper-outer quadrant of left breast in female, estrogen receptor positive ([place])  C50.412    Z17.0       SUMMARY OF ONCOLOGIC HISTORY: Oncology History  Malignant neoplasm of upper-outer quadrant of left breast in female, estrogen receptor positive ([place])  12/29/2020 Initial Diagnosis   Screening mammogram: 2 possible areas of distortion in the left breast 1o clock 11 O Clock. Diagnostic mammogram and [place]: large suspicious area of distortion within the superior left breast.  Both biopsies: Grade 2 IDC ER 95%, PR 95%, Ki-67 5%, HER2 equivocal by IHC, FISH negative ratio 1.08 and copy #1.95     CHIEF COMPLIANT: Follow-up of left breast cancer  INTERVAL HISTORY: [name] is a 70 y.o. with above-mentioned history of left breast cancer. 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 She presents to the clinic today for follow-up.   ALLERGIES:  is allergic to hydrolyzed silk, gluten meal, and metoprolol.  MEDICATIONS:  Current Outpatient Medications  Medication Sig Dispense Refill   acetaminophen (TYLENOL) 500 MG tablet Take 2 tablets (1,000 mg total) by mouth every 6 (six) hours as needed.  0   Cranberry 500 MG TABS Take 1,000 mg by mouth 2 (two) times daily.     Cyanocobalamin (VITAMIN B 12) 500 MCG  TABS Take 1 tablet by mouth daily.     diltiazem (CARDIZEM) 60 MG tablet Take 60 mg daily if needed for palpitations 30 tablet 9   diltiazem (TIAZAC) 240 MG 24 hr capsule TAKE 1 CAPSULE BY MOUTH EVERY DAY 90 capsule 2   ELIQUIS 5 MG TABS tablet TAKE 1 TABLET(5 MG) BY MOUTH TWICE DAILY 180 tablet 1   ibuprofen (ADVIL) 800 MG tablet Take 1 tablet (800 mg total) by mouth every 8 (eight) hours as needed. 30 tablet 0   methenamine (HIPREX) 1 g tablet Take 1 g by mouth 2 (two) times daily.     MYRBETRIQ 50 MG TB24 tablet Take 50 mg by mouth daily.     omeprazole (PRILOSEC) 20 MG capsule Take 20 mg by mouth daily as needed.     OVER THE COUNTER MEDICATION Take 20 mg by mouth daily as needed (arthritis). CBD oil     oxyCODONE (OXY IR/ROXICODONE) 5 MG immediate release tablet Take 1 tablet (5 mg total) by mouth every 6 (six) hours as needed for severe pain. 15 tablet 0   polyethylene glycol (MIRALAX / GLYCOLAX) 17 g packet Take 17 g by mouth as needed.     sennosides-docusate sodium (SENOKOT-S) 8.6-50 MG tablet Take 1 tablet by mouth as needed for constipation.     spironolactone (ALDACTONE) 50 MG tablet TAKE 2 TABLETS BY MOUTH EVERY MORNING AND 1 TABLET EVERY EVENING 270 tablet 3   triamcinolone cream (KENALOG) 0.1 % Apply 1 application topically daily as needed (for dry skin).   1   vitamin C (ASCORBIC ACID) 500 MG  tablet Take 500 mg by mouth 2 (two) times daily.     Vitamin D, Ergocalciferol, (DRISDOL) 1.25 MG (50000 UNIT) CAPS capsule Take 1 capsule (50,000 Units total) by mouth every 7 (seven) days for 12 doses. 12 capsule 0   No current facility-administered medications for this visit.    PHYSICAL EXAMINATION: ECOG PERFORMANCE STATUS: 1 - Symptomatic but completely ambulatory  Vitals:   03/03/21 1155  BP: 131/61  Pulse: 80  Resp: 18  Temp: 97.9 F (36.6 C)  SpO2: 97%   Filed Weights   03/03/21 1155  Weight: 237 lb 1.6 oz (107.5 kg)    BREAST: No palpable masses or nodules in either  right or left breasts. No palpable axillary supraclavicular or infraclavicular adenopathy no breast tenderness or nipple discharge. (exam performed in the presence of a chaperone)  LABORATORY DATA:  I have reviewed the data as listed CMP Latest Ref Rng & Units 02/10/2021 11/27/2020 04/15/2020  Glucose 70 - 99 mg/dL 91 90 87  BUN 8 - 23 mg/dL _0 Creatinine 0.44 - 1.00 mg/dL 0.86 0.85 0.76  Sodium 135 - 145 mmol/L 136 138 139  Potassium 3.5 - 5.1 mmol/L 4.5 4.1 3.9  Chloride 98 - 111 mmol/L 104 104 103  CO2 22 - 32 mmol/L _1 Calcium 8.9 - 10.3 mg/dL 9.0 9.2 9.3  Total Protein 6.0 - 8.3 g/dL - 6.6 -  Total Bilirubin 0.2 - 1.2 mg/dL - 0.7 -  Alkaline Phos 39 - 117 U/L - 65 -  AST 0 - 37 U/L - 21 -  ALT 0 - 35 U/L - 27 -    Lab Results  Component Value Date   WBC 5.5 11/27/2020   HGB 13.8 11/27/2020   HCT 40.7 11/27/2020   MCV 102.7 (H) 11/27/2020   PLT 183.0 11/27/2020   NEUTROABS 3.1 11/27/2020    ASSESSMENT & PLAN:  Malignant neoplasm of upper-outer quadrant of left breast in female, estrogen receptor positive ([place]) 12/29/2020:Screening mammogram: 2 possible areas of distortion in the left breast 1o clock (1 cm) 11 O Clock (0.9 cm) approximately 4.3 cm of breast tissue between the 2 masses. Diagnostic mammogram and [place]: large suspicious area of distortion within the superior left breast.  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 I discussed the margins as well as lymph node surgeries. We also discussed the final staging along with previously performed ER/PR and HER-2/neu testing.  Treatment plan: 1.  MammaPrint  testing : Low risk 2. adjuvant radiation therapy 3.  Adjuvant antiestrogen therapy  Patient had a skin infection postsurgery in the axilla.  She is not currently on doxycycline.  She also had a UTI and was treated with Macrobid. Return to clinic after radiation is complete.    No orders of the defined types were placed in this encounter.  The patient has a good understanding of the overall plan. she agrees with it. she will call with any problems that may develop before the next visit here.  Total time spent: 30 mins including face to face time and time spent for planning, charting and  coordination of care  Rulon Eisenmenger, MD, MPH 03/03/2021  I, Thana Ates, am acting as scribe for Dr. Nicholas Lose.  I have reviewed the above documentation for accuracy and completeness, and I agree with the above.

## 2021-03-03 ENCOUNTER — Encounter: Payer: Self-pay | Admitting: *Deleted

## 2021-03-03 ENCOUNTER — Telehealth: Payer: Self-pay | Admitting: Radiation Oncology

## 2021-03-03 ENCOUNTER — Encounter: Payer: Self-pay | Admitting: Hematology and Oncology

## 2021-03-03 ENCOUNTER — Telehealth: Payer: Self-pay | Admitting: *Deleted

## 2021-03-03 ENCOUNTER — Inpatient Hospital Stay: Payer: PPO | Attending: Hematology and Oncology | Admitting: Hematology and Oncology

## 2021-03-03 ENCOUNTER — Other Ambulatory Visit: Payer: Self-pay

## 2021-03-03 DIAGNOSIS — Z170 Estrogen receptor positive status [ER+]: Secondary | ICD-10-CM | POA: Diagnosis not present

## 2021-03-03 DIAGNOSIS — C50412 Malignant neoplasm of upper-outer quadrant of left female breast: Secondary | ICD-10-CM | POA: Diagnosis not present

## 2021-03-03 DIAGNOSIS — N390 Urinary tract infection, site not specified: Secondary | ICD-10-CM | POA: Diagnosis not present

## 2021-03-03 NOTE — Telephone Encounter (Signed)
Received mammaprint results of low risk. Patient is seeing Dr. Lindi Adie today 12/21. Referral placed to see Dr. Isidore Moos.

## 2021-03-04 ENCOUNTER — Other Ambulatory Visit: Payer: Self-pay | Admitting: Cardiology

## 2021-03-04 ENCOUNTER — Other Ambulatory Visit: Payer: Self-pay | Admitting: Internal Medicine

## 2021-03-04 DIAGNOSIS — I4891 Unspecified atrial fibrillation: Secondary | ICD-10-CM

## 2021-03-04 NOTE — Telephone Encounter (Signed)
Prescription refill request for Eliquis received. Indication: Afib  Last office visit:02/16/21 ([place])  Scr: 0.86 (02/10/21)  Age: 70 Weight: 107.5kg  Appropriate dose and refill sent to requested pharmacy.

## 2021-03-16 NOTE — Progress Notes (Signed)
Location of Breast Cancer:  Malignant neoplasm of upper-outer quadrant of left breast, estrogen receptor positive  Histology per Pathology Report:  02/16/2021 FINAL MICROSCOPIC DIAGNOSIS:  A. BREAST, LEFT, LUMPECTOMY:  - Invasive and in situ ductal carcinoma, 4.6 cm.  - Lymphovascular space involvement.  - Biopsy sites and biopsy clips.  - See oncology table.  B. BREAST, LEFT ANTERIOR POSTERIOR MARGIN, EXCISION:  - Benign breast tissue.  - Final anterior posterior margin negative for carcinoma.  C. BREAST, LEFT SUPERIOR LATERAL MARGIN, EXCISION:  - Benign breast tissue.  - Final superior lateral margin negative for carcinoma.  D. BREAST, LEFT MEDIAL POSTERIOR MARGIN, EXCISION:  - Benign breast tissue.  - Final medial posterior margin negative for carcinoma.  E. LYMPH NODE, LEFT AXILLA, SENTINEL, EXCISION:  - Metastatic carcinoma in 1 lymph node (1/1).  - Metastasis is 0.25 cm.  F. LYMPH NODE, LEFT AXILLA, SENTINEL, EXCISION:  - 1 lymph node negative for metastatic carcinoma (0/1).   Receptor Status: ER(95%), PR (100%), Her2-neu (Negative via FISH), Ki-67(2%)  Did patient present with symptoms (if so, please note symptoms) or was this found on screening mammography?: Screening mammogram on 12/02/2020 showed 2 possible areas of distortion in the left breast. Diagnostic mammogram and [place] on 12/24/2020 showed large suspicious area of distortion within the superior left breast.   Past/Anticipated interventions by surgeon, if any:  02/16/2021 Dr. Marcello Moores Cornett Left breast seed localized lumpectomy with left axillary sentinel lymph node mapping using mag trace  Past/Anticipated interventions by medical oncology, if any:  Under care of Dr. Nicholas Lose 03/03/2021 --Treatment plan: 1.  MammaPrint testing : Low risk 2. adjuvant radiation therapy 3.  Adjuvant antiestrogen therapy --Patient had a skin infection postsurgery in the axilla.  She is not currently on doxycycline.  She also  had a UTI and was treated with Macrobid. --Return to clinic after radiation is complete.  Lymphedema issues, if any:  Patient denies    Pain issues, if any:  Reports occasional discomfort to surgical site, but states it has improved and is no longer activity limiting   SAFETY ISSUES: Prior radiation? No Pacemaker/ICD? No (does have an Inspire sleep apnea implanted device) Possible current pregnancy? No--hysterectomy Is the patient on methotrexate? No  Current Complaints / other details:  Continuing with oral antibiotics for infection at surgical site. Scheduled for another post-op follow-up with Dr. Brantley Stage on 03/26/2021

## 2021-03-16 NOTE — Progress Notes (Signed)
Radiation Oncology         [phone] ________________________________  Name: [name] MRN: [id number]  Date: 03/17/2021  DOB: 06/22/1950  Follow-Up Visit Note  Outpatient  CC: Isaac Bliss, Rayford Halsted, MD  Nicholas Lose, MD  Diagnosis:      ICD-10-CM   1. Malignant neoplasm of upper-outer quadrant of left breast in female, estrogen receptor positive ([place])  C50.412    Z17.0         Cancer Staging  Malignant neoplasm of upper-outer quadrant of left breast in female, estrogen receptor positive ([place]) Staging form: Breast, AJCC 8th Edition - Pathologic stage from 03/02/2021: Stage IB (pT2, pN1, cM0, G2, ER+, PR+, HER2-) - Unsigned Stage prefix: Initial diagnosis Multigene prognostic tests performed: MammaPrint Histologic grading system: 3 grade system  pT2, pN1a     CHIEF COMPLAINT: Here to discuss management of left breast cancer  Narrative:  The patient returns today for follow-up.     Since consultation date of 01/12/21, the patient opted to proceed with left breast lumpectomy and nodal biopsies on the date of 02/16/21 under the care of Dr. Brantley Stage.  Pathology from the procedure revealed: tumor the size of 4.6 cm; histology of invasive and in-situ ductal carcinoma. Margin status of invasive disease: all margins negative for carcinoma. Margin status of in situ disease: all margins negative for DCIS. Nodal status of 1/2 left axillary sentinel lymph node excisions positive for metastatic carcinoma, with metastasis measuring 0.25 cm.  ER status: 95% positive; PR status 100% positive, (both with strong staining intensity), Her2 status negative by FISH; proliferation marker Ki67 at 2%; Grade 2.  MammaPrint testing performed on the final surgical sample revealed the patient as low risk luminal-type A.   The patient followed up with Dr. Brantley Stage on 03/01/21. During which time, the patient reported drainage coming from her left axillary SLN site, as well as some mild pain and skin  changes. Upon evaluation, the left axillary incision was noted to show some serous drainage and trace erythema. Accordingly, Dr. Brantley Stage prescribed the patient doxycycline.    The patient followed up with Dr. Lindi Adie on 03/03/21 to discuss further treatment options. Given her low-risk results from MammaPrint testing, Dr. Lindi Adie recommended for the patient to proceed with XRT, followed by adjuvant antiestrogen therapy.   On 03/04/21, the patient reported continued drainage come from her SLN wound site during a follow up with Dr. Windle Guard (general surgery). Given this, Dr. Windle Guard switched her to Augmentin.  More recently, she resumed doxycycline this week for cont'd signs of infection.  Symptomatically, the patient reports:  Lymphedema issues, if any:  Patient denies    Pain issues, if any:  Reports occasional discomfort to surgical site, but states it has improved and is no longer activity limiting   SAFETY ISSUES: Prior radiation? No Pacemaker/ICD? No (does have an Inspire sleep apnea implanted device) Possible current pregnancy? No--hysterectomy Is the patient on methotrexate? No  Current Complaints / other details:  Continuing with oral antibiotics for infection at surgical site. Scheduled for another post-op follow-up with Dr. Brantley Stage on 03/26/2021           ALLERGIES:  is allergic to hydrolyzed silk, gluten meal, and metoprolol.  Meds: Current Outpatient Medications  Medication Sig Dispense Refill   doxycycline (MONODOX) 100 MG capsule Take 1 capsule by mouth 2 (two) times daily.     acetaminophen (TYLENOL) 500 MG tablet Take 2 tablets (1,000 mg total) by mouth every 6 (six) hours as needed.  0   Cranberry 500 MG TABS Take 1,000 mg by mouth 2 (two) times daily.     Cyanocobalamin (VITAMIN B 12) 500 MCG TABS Take 1 tablet by mouth daily.     diltiazem (CARDIZEM) 60 MG tablet Take 60 mg daily if needed for palpitations 30 tablet 9   diltiazem (TIAZAC) 240 MG 24 hr capsule TAKE 1 CAPSULE  BY MOUTH EVERY DAY 90 capsule 2   ELIQUIS 5 MG TABS tablet TAKE 1 TABLET(5 MG) BY MOUTH TWICE DAILY 180 tablet 1   ibuprofen (ADVIL) 800 MG tablet Take 1 tablet (800 mg total) by mouth every 8 (eight) hours as needed. 30 tablet 0   methenamine (HIPREX) 1 g tablet Take 1 g by mouth 2 (two) times daily.     MYRBETRIQ 50 MG TB24 tablet Take 50 mg by mouth daily.     omeprazole (PRILOSEC) 20 MG capsule Take 20 mg by mouth daily as needed.     OVER THE COUNTER MEDICATION Take 20 mg by mouth daily as needed (arthritis). CBD oil     polyethylene glycol (MIRALAX / GLYCOLAX) 17 g packet Take 17 g by mouth as needed.     sennosides-docusate sodium (SENOKOT-S) 8.6-50 MG tablet Take 1 tablet by mouth as needed for constipation.     spironolactone (ALDACTONE) 50 MG tablet TAKE 2 TABLETS BY MOUTH EVERY MORNING AND 1 TABLET EVERY EVENING 270 tablet 1   triamcinolone cream (KENALOG) 0.1 % Apply 1 application topically daily as needed (for dry skin).   1   vitamin C (ASCORBIC ACID) 500 MG tablet Take 500 mg by mouth 2 (two) times daily.     Vitamin D, Ergocalciferol, (DRISDOL) 1.25 MG (50000 UNIT) CAPS capsule Take 1 capsule (50,000 Units total) by mouth every 7 (seven) days for 12 doses. 12 capsule 0   No current facility-administered medications for this encounter.    Physical Findings:  height is 5' 9"  (1.753 m) and weight is 233 lb 3.2 oz (105.8 kg). Her temperature is 98.1 F (36.7 C). Her blood pressure is 145/70 (abnormal) and her pulse is 77. Her respiration is 20 and oxygen saturation is 98%. .     General: Alert and oriented, in no acute distress Psychiatric: Judgment and insight are intact. Affect is appropriate. Breast exam reveals open area at left axillary scar with active drainage of yellowish discharge. Surround erythema of skin, especially at surgical scar.  Lab Findings: Lab Results  Component Value Date   WBC 5.5 11/27/2020   HGB 13.8 11/27/2020   HCT 40.7 11/27/2020   MCV 102.7 (H)  11/27/2020   PLT 183.0 11/27/2020      Radiographic Findings: MM Breast Surgical Specimen  Result Date: 02/16/2021 CLINICAL DATA:  Post excision left breast lesions following radioactive seed localization. Assess specimen mammogram. EXAM: SPECIMEN RADIOGRAPH OF THE LEFT BREAST COMPARISON:  Previous exam(s). FINDINGS: Status post excision of the left breast. Both radioactive seeds as well as the coil shaped and ribbon shaped biopsy clips are within the center of the specimen. IMPRESSION: Specimen radiograph of the left breast. Electronically Signed   By: Lajean Manes M.D.   On: 02/16/2021 16:23  MM LT RADIOACTIVE SEED LOC MAMMO GUIDE  Result Date: 02/16/2021 CLINICAL DATA:  71 year old female for radioactive seed localizations of 2 LEFT breast cancers. EXAM: MAMMOGRAPHIC GUIDED RADIOACTIVE SEED LOCALIZATION OF THE LEFT BREAST X 2 COMPARISON:  Previous exam(s). FINDINGS: Patient presents for radioactive seed localization prior to LEFT lumpectomies. I met with the  patient and we discussed the procedure of seed localization including benefits and alternatives. We discussed the high likelihood of successful procedures. We discussed the risks of the procedures including infection, bleeding, tissue injury and further surgery. We discussed the low dose of radioactivity involved in the procedures. Informed, written consent was given. The usual time-out protocol was performed immediately prior to the procedures. MAMMOGRAPHIC GUIDED RADIOACTIVE SEED LOCALIZATION OF THE LEFT BREAST (LEFT breast cancer marked by RIBBON clip) Using mammographic guidance, sterile technique, 1% lidocaine and an I-125 radioactive seed, the RIBBON clip was localized using a SUPERIOR approach. The follow-up mammogram images confirm the seed in the expected location and were marked for Dr. Brantley Stage. Follow-up survey of the patient confirms presence of the radioactive seed. Order number of I-125 seed:  [id number]. Total activity:  0.076  millicuries.  Reference Date: 12/25/2020 MAMMOGRAPHIC GUIDED RADIOACTIVE SEED LOCALIZATION OF THE LEFT BREAST (LEFT breast cancer marked by COIL clip) Using mammographic guidance, sterile technique, 1% lidocaine and an I-125 radioactive seed, the COIL clip was localized using a SUPERIOR approach. The follow-up mammogram images confirm the seed in the expected location and were marked for Dr. Brantley Stage. Follow-up survey of the patient confirms presence of the radioactive seed. Order number of I-125 seed:  [id number]. Total activity:  6.256 millicuries.  Reference Date: 12/25/2020 The patient tolerated the procedure well and was released from the [place]. She was given instructions regarding seed removal. IMPRESSION: Two radioactive seed localizations of the LEFT breast. No apparent complications. Electronically Signed   By: Margarette Canada M.D.   On: 02/16/2021 09:54  MM LT RAD SEED EA ADD LESION LOC MAMMO  Result Date: 02/16/2021 CLINICAL DATA:  71 year old female for radioactive seed localizations of 2 LEFT breast cancers. EXAM: MAMMOGRAPHIC GUIDED RADIOACTIVE SEED LOCALIZATION OF THE LEFT BREAST X 2 COMPARISON:  Previous exam(s). FINDINGS: Patient presents for radioactive seed localization prior to LEFT lumpectomies. I met with the patient and we discussed the procedure of seed localization including benefits and alternatives. We discussed the high likelihood of successful procedures. We discussed the risks of the procedures including infection, bleeding, tissue injury and further surgery. We discussed the low dose of radioactivity involved in the procedures. Informed, written consent was given. The usual time-out protocol was performed immediately prior to the procedures. MAMMOGRAPHIC GUIDED RADIOACTIVE SEED LOCALIZATION OF THE LEFT BREAST (LEFT breast cancer marked by RIBBON clip) Using mammographic guidance, sterile technique, 1% lidocaine and an I-125 radioactive seed, the RIBBON clip was localized using a  SUPERIOR approach. The follow-up mammogram images confirm the seed in the expected location and were marked for Dr. Brantley Stage. Follow-up survey of the patient confirms presence of the radioactive seed. Order number of I-125 seed:  [id number]. Total activity:  7.681 millicuries.  Reference Date: 12/25/2020 MAMMOGRAPHIC GUIDED RADIOACTIVE SEED LOCALIZATION OF THE LEFT BREAST (LEFT breast cancer marked by COIL clip) Using mammographic guidance, sterile technique, 1% lidocaine and an I-125 radioactive seed, the COIL clip was localized using a SUPERIOR approach. The follow-up mammogram images confirm the seed in the expected location and were marked for Dr. Brantley Stage. Follow-up survey of the patient confirms presence of the radioactive seed. Order number of I-125 seed:  [id number]. Total activity:  5.974 millicuries.  Reference Date: 12/25/2020 The patient tolerated the procedure well and was released from the [place]. She was given instructions regarding seed removal. IMPRESSION: Two radioactive seed localizations of the LEFT breast. No apparent complications. Electronically Signed   By: Cleatis Polka.D.  On: 02/16/2021 09:54   Impression/Plan: Left breast cancer  We discussed adjuvant radiotherapy today.  I recommend radiation therapy to the left breast and regional nodes in order to reduce risk of locoregional recurrence by 2/3.  I reviewed the logistics, benefits, risks, and potential side effects of this treatment in detail. Risks may include but not necessary be limited to acute and late injury tissue in the radiation fields such as skin irritation (change in color/pigmentation, itching, dryness, pain, peeling). She may experience fatigue. We also discussed possible risk of long term cosmetic changes or scar tissue. There is also a smaller risk for lung toxicity, cardiac toxicity, brachial plexopathy, lymphedema, musculoskeletal changes, rib fragility or induction of a second malignancy, late chronic  non-healing soft tissue wound.    The patient asked good questions which I answered to her satisfaction. She is enthusiastic about proceeding with treatment. A consent form has been  signed and placed in her chart.  We are simming / planning her RT as scheduled 1/6, but I will ask the team not to schedule her RT start  until 1/30 due to drainage/infection.   I asked Dr. Brantley Stage to please let me know if wound hasn't closed or infection hasn't resolved by then.    On date of service, in total, I spent 35 minutes on this encounter. Patient was seen in person.  _____________________________________   Eppie Gibson, MD  This document serves as a record of services personally performed by Eppie Gibson, MD. It was created on her behalf by Roney Mans, a trained medical scribe. The creation of this record is based on the scribe's personal observations and the provider's statements to them. This document has been checked and approved by the attending provider.

## 2021-03-17 ENCOUNTER — Encounter: Payer: Self-pay | Admitting: *Deleted

## 2021-03-17 ENCOUNTER — Other Ambulatory Visit: Payer: Self-pay

## 2021-03-17 ENCOUNTER — Ambulatory Visit
Admission: RE | Admit: 2021-03-17 | Discharge: 2021-03-17 | Disposition: A | Discharge status: Home / Self Care | Payer: PPO | Source: Ambulatory Visit | Attending: Radiation Oncology | Admitting: Radiation Oncology

## 2021-03-17 ENCOUNTER — Encounter: Payer: Self-pay | Admitting: Radiation Oncology

## 2021-03-17 VITALS — BP 145/70 | HR 77 | Temp 98.1°F | Resp 20 | Ht 69.0 in | Wt 233.2 lb

## 2021-03-17 DIAGNOSIS — C50412 Malignant neoplasm of upper-outer quadrant of left female breast: Secondary | ICD-10-CM | POA: Insufficient documentation

## 2021-03-17 DIAGNOSIS — Z79899 Other long term (current) drug therapy: Secondary | ICD-10-CM | POA: Insufficient documentation

## 2021-03-17 DIAGNOSIS — Z170 Estrogen receptor positive status [ER+]: Secondary | ICD-10-CM | POA: Insufficient documentation

## 2021-03-17 DIAGNOSIS — Z7901 Long term (current) use of anticoagulants: Secondary | ICD-10-CM | POA: Diagnosis not present

## 2021-03-19 ENCOUNTER — Other Ambulatory Visit: Payer: Self-pay

## 2021-03-19 ENCOUNTER — Ambulatory Visit
Admission: RE | Admit: 2021-03-19 | Discharge: 2021-03-19 | Disposition: A | Discharge status: Home / Self Care | Payer: PPO | Source: Ambulatory Visit | Attending: Radiation Oncology | Admitting: Radiation Oncology

## 2021-03-19 DIAGNOSIS — Z510 Encounter for antineoplastic radiation therapy: Secondary | ICD-10-CM | POA: Insufficient documentation

## 2021-03-19 DIAGNOSIS — C50412 Malignant neoplasm of upper-outer quadrant of left female breast: Secondary | ICD-10-CM | POA: Diagnosis not present

## 2021-03-19 DIAGNOSIS — Z170 Estrogen receptor positive status [ER+]: Secondary | ICD-10-CM | POA: Diagnosis not present

## 2021-03-22 DIAGNOSIS — N3020 Other chronic cystitis without hematuria: Secondary | ICD-10-CM | POA: Diagnosis not present

## 2021-03-22 DIAGNOSIS — N3281 Overactive bladder: Secondary | ICD-10-CM | POA: Diagnosis not present

## 2021-03-25 ENCOUNTER — Encounter: Payer: Self-pay | Admitting: *Deleted

## 2021-03-26 ENCOUNTER — Telehealth: Payer: Self-pay

## 2021-03-26 ENCOUNTER — Telehealth: Payer: Self-pay | Admitting: Hematology and Oncology

## 2021-03-26 DIAGNOSIS — C50412 Malignant neoplasm of upper-outer quadrant of left female breast: Secondary | ICD-10-CM | POA: Diagnosis not present

## 2021-03-26 DIAGNOSIS — Z170 Estrogen receptor positive status [ER+]: Secondary | ICD-10-CM | POA: Diagnosis not present

## 2021-03-26 DIAGNOSIS — Z510 Encounter for antineoplastic radiation therapy: Secondary | ICD-10-CM | POA: Diagnosis not present

## 2021-03-26 NOTE — Telephone Encounter (Signed)
Sch per 1/12 inbasket, pt is awar

## 2021-03-26 NOTE — Telephone Encounter (Signed)
Spoke with patient to ensure she is able to turn off her Inspire apnea device prior to each of her radiation treatments. She stated she only turns the device on at night before bed, but she will plan bring with her on her first treatment day to demonstrate that it is turned off. Will set a reminder in patient's chart to ensure she has device investigated for functionality once she completes her radiation regimen

## 2021-03-29 ENCOUNTER — Ambulatory Visit: Payer: PPO | Admitting: Radiation Oncology

## 2021-03-30 ENCOUNTER — Ambulatory Visit: Payer: PPO

## 2021-03-31 ENCOUNTER — Ambulatory Visit: Payer: PPO

## 2021-04-01 ENCOUNTER — Ambulatory Visit: Payer: PPO

## 2021-04-02 ENCOUNTER — Ambulatory Visit: Payer: PPO

## 2021-04-05 ENCOUNTER — Ambulatory Visit: Payer: PPO

## 2021-04-06 ENCOUNTER — Ambulatory Visit: Payer: PPO

## 2021-04-07 ENCOUNTER — Ambulatory Visit: Payer: PPO

## 2021-04-08 ENCOUNTER — Ambulatory Visit: Payer: PPO

## 2021-04-09 ENCOUNTER — Ambulatory Visit: Payer: PPO

## 2021-04-12 ENCOUNTER — Ambulatory Visit
Admission: RE | Admit: 2021-04-12 | Discharge: 2021-04-12 | Disposition: A | Discharge status: Home / Self Care | Payer: PPO | Source: Ambulatory Visit | Attending: Radiation Oncology | Admitting: Radiation Oncology

## 2021-04-12 ENCOUNTER — Ambulatory Visit: Payer: PPO

## 2021-04-12 ENCOUNTER — Other Ambulatory Visit: Payer: Self-pay

## 2021-04-12 DIAGNOSIS — Z170 Estrogen receptor positive status [ER+]: Secondary | ICD-10-CM | POA: Diagnosis not present

## 2021-04-12 DIAGNOSIS — Z510 Encounter for antineoplastic radiation therapy: Secondary | ICD-10-CM | POA: Diagnosis not present

## 2021-04-12 DIAGNOSIS — C50412 Malignant neoplasm of upper-outer quadrant of left female breast: Secondary | ICD-10-CM | POA: Diagnosis not present

## 2021-04-12 MED ORDER — RADIAPLEXRX EX GEL: Freq: Once | CUTANEOUS | Status: AC

## 2021-04-12 MED ORDER — ALRA NON-METALLIC DEODORANT (RAD-ONC)
1.0000 "application " | Freq: Once | TOPICAL | Status: AC
  Administered 2021-04-12: 1 via TOPICAL

## 2021-04-12 NOTE — Progress Notes (Signed)

## 2021-04-13 ENCOUNTER — Ambulatory Visit: Payer: PPO

## 2021-04-13 ENCOUNTER — Ambulatory Visit
Admission: RE | Admit: 2021-04-13 | Discharge: 2021-04-13 | Disposition: A | Discharge status: Home / Self Care | Payer: PPO | Source: Ambulatory Visit | Attending: Radiation Oncology | Admitting: Radiation Oncology

## 2021-04-13 DIAGNOSIS — C50412 Malignant neoplasm of upper-outer quadrant of left female breast: Secondary | ICD-10-CM | POA: Diagnosis not present

## 2021-04-13 DIAGNOSIS — Z170 Estrogen receptor positive status [ER+]: Secondary | ICD-10-CM | POA: Diagnosis not present

## 2021-04-13 DIAGNOSIS — Z510 Encounter for antineoplastic radiation therapy: Secondary | ICD-10-CM | POA: Diagnosis not present

## 2021-04-14 ENCOUNTER — Ambulatory Visit
Admission: RE | Admit: 2021-04-14 | Discharge: 2021-04-14 | Disposition: A | Discharge status: Home / Self Care | Payer: PPO | Source: Ambulatory Visit | Attending: Radiation Oncology | Admitting: Radiation Oncology

## 2021-04-14 ENCOUNTER — Ambulatory Visit: Payer: PPO

## 2021-04-14 ENCOUNTER — Other Ambulatory Visit: Payer: Self-pay

## 2021-04-14 DIAGNOSIS — C50412 Malignant neoplasm of upper-outer quadrant of left female breast: Secondary | ICD-10-CM | POA: Insufficient documentation

## 2021-04-14 DIAGNOSIS — Z510 Encounter for antineoplastic radiation therapy: Secondary | ICD-10-CM | POA: Insufficient documentation

## 2021-04-14 DIAGNOSIS — Z170 Estrogen receptor positive status [ER+]: Secondary | ICD-10-CM | POA: Diagnosis not present

## 2021-04-15 ENCOUNTER — Ambulatory Visit: Payer: PPO

## 2021-04-15 ENCOUNTER — Ambulatory Visit
Admission: RE | Admit: 2021-04-15 | Discharge: 2021-04-15 | Disposition: A | Discharge status: Home / Self Care | Payer: PPO | Source: Ambulatory Visit | Attending: Radiation Oncology | Admitting: Radiation Oncology

## 2021-04-15 DIAGNOSIS — Z510 Encounter for antineoplastic radiation therapy: Secondary | ICD-10-CM | POA: Diagnosis not present

## 2021-04-15 DIAGNOSIS — C50412 Malignant neoplasm of upper-outer quadrant of left female breast: Secondary | ICD-10-CM | POA: Diagnosis not present

## 2021-04-15 DIAGNOSIS — Z170 Estrogen receptor positive status [ER+]: Secondary | ICD-10-CM | POA: Diagnosis not present

## 2021-04-16 ENCOUNTER — Ambulatory Visit: Payer: PPO

## 2021-04-16 ENCOUNTER — Ambulatory Visit
Admission: RE | Admit: 2021-04-16 | Discharge: 2021-04-16 | Disposition: A | Discharge status: Home / Self Care | Payer: PPO | Source: Ambulatory Visit | Attending: Radiation Oncology | Admitting: Radiation Oncology

## 2021-04-16 ENCOUNTER — Other Ambulatory Visit: Payer: Self-pay

## 2021-04-16 DIAGNOSIS — Z170 Estrogen receptor positive status [ER+]: Secondary | ICD-10-CM | POA: Diagnosis not present

## 2021-04-16 DIAGNOSIS — C50412 Malignant neoplasm of upper-outer quadrant of left female breast: Secondary | ICD-10-CM | POA: Diagnosis not present

## 2021-04-16 DIAGNOSIS — Z510 Encounter for antineoplastic radiation therapy: Secondary | ICD-10-CM | POA: Diagnosis not present

## 2021-04-19 ENCOUNTER — Ambulatory Visit: Payer: PPO

## 2021-04-19 ENCOUNTER — Other Ambulatory Visit: Payer: Self-pay

## 2021-04-19 ENCOUNTER — Ambulatory Visit
Admission: RE | Admit: 2021-04-19 | Discharge: 2021-04-19 | Disposition: A | Discharge status: Home / Self Care | Payer: PPO | Source: Ambulatory Visit | Attending: Radiation Oncology | Admitting: Radiation Oncology

## 2021-04-19 DIAGNOSIS — C50412 Malignant neoplasm of upper-outer quadrant of left female breast: Secondary | ICD-10-CM | POA: Diagnosis not present

## 2021-04-19 DIAGNOSIS — Z170 Estrogen receptor positive status [ER+]: Secondary | ICD-10-CM | POA: Diagnosis not present

## 2021-04-19 DIAGNOSIS — Z510 Encounter for antineoplastic radiation therapy: Secondary | ICD-10-CM | POA: Diagnosis not present

## 2021-04-20 ENCOUNTER — Ambulatory Visit
Admission: RE | Admit: 2021-04-20 | Discharge: 2021-04-20 | Disposition: A | Discharge status: Home / Self Care | Payer: PPO | Source: Ambulatory Visit | Attending: Radiation Oncology | Admitting: Radiation Oncology

## 2021-04-20 ENCOUNTER — Ambulatory Visit: Payer: PPO

## 2021-04-20 DIAGNOSIS — Z510 Encounter for antineoplastic radiation therapy: Secondary | ICD-10-CM | POA: Diagnosis not present

## 2021-04-20 DIAGNOSIS — C50412 Malignant neoplasm of upper-outer quadrant of left female breast: Secondary | ICD-10-CM | POA: Diagnosis not present

## 2021-04-20 DIAGNOSIS — Z170 Estrogen receptor positive status [ER+]: Secondary | ICD-10-CM | POA: Diagnosis not present

## 2021-04-21 ENCOUNTER — Ambulatory Visit: Payer: PPO

## 2021-04-21 ENCOUNTER — Ambulatory Visit
Admission: RE | Admit: 2021-04-21 | Discharge: 2021-04-21 | Disposition: A | Discharge status: Home / Self Care | Payer: PPO | Source: Ambulatory Visit | Attending: Radiation Oncology | Admitting: Radiation Oncology

## 2021-04-21 ENCOUNTER — Other Ambulatory Visit: Payer: Self-pay

## 2021-04-21 DIAGNOSIS — Z510 Encounter for antineoplastic radiation therapy: Secondary | ICD-10-CM | POA: Diagnosis not present

## 2021-04-21 DIAGNOSIS — C50412 Malignant neoplasm of upper-outer quadrant of left female breast: Secondary | ICD-10-CM | POA: Diagnosis not present

## 2021-04-21 DIAGNOSIS — Z170 Estrogen receptor positive status [ER+]: Secondary | ICD-10-CM | POA: Diagnosis not present

## 2021-04-22 ENCOUNTER — Ambulatory Visit: Payer: PPO

## 2021-04-22 ENCOUNTER — Ambulatory Visit
Admission: RE | Admit: 2021-04-22 | Discharge: 2021-04-22 | Disposition: A | Discharge status: Home / Self Care | Payer: PPO | Source: Ambulatory Visit | Attending: Radiation Oncology | Admitting: Radiation Oncology

## 2021-04-22 DIAGNOSIS — C50412 Malignant neoplasm of upper-outer quadrant of left female breast: Secondary | ICD-10-CM | POA: Diagnosis not present

## 2021-04-22 DIAGNOSIS — Z170 Estrogen receptor positive status [ER+]: Secondary | ICD-10-CM | POA: Diagnosis not present

## 2021-04-22 DIAGNOSIS — Z510 Encounter for antineoplastic radiation therapy: Secondary | ICD-10-CM | POA: Diagnosis not present

## 2021-04-23 ENCOUNTER — Ambulatory Visit
Admission: RE | Admit: 2021-04-23 | Discharge: 2021-04-23 | Disposition: A | Discharge status: Home / Self Care | Payer: PPO | Source: Ambulatory Visit | Attending: Radiation Oncology | Admitting: Radiation Oncology

## 2021-04-23 ENCOUNTER — Other Ambulatory Visit: Payer: Self-pay

## 2021-04-23 ENCOUNTER — Ambulatory Visit: Payer: PPO

## 2021-04-23 DIAGNOSIS — Z510 Encounter for antineoplastic radiation therapy: Secondary | ICD-10-CM | POA: Diagnosis not present

## 2021-04-23 DIAGNOSIS — C50412 Malignant neoplasm of upper-outer quadrant of left female breast: Secondary | ICD-10-CM | POA: Diagnosis not present

## 2021-04-23 DIAGNOSIS — Z170 Estrogen receptor positive status [ER+]: Secondary | ICD-10-CM | POA: Diagnosis not present

## 2021-04-26 ENCOUNTER — Ambulatory Visit: Payer: PPO

## 2021-04-26 ENCOUNTER — Ambulatory Visit: Payer: PPO | Admitting: Radiation Oncology

## 2021-04-26 ENCOUNTER — Ambulatory Visit
Admission: RE | Admit: 2021-04-26 | Discharge: 2021-04-26 | Disposition: A | Discharge status: Home / Self Care | Payer: PPO | Source: Ambulatory Visit | Attending: Radiation Oncology | Admitting: Radiation Oncology

## 2021-04-26 ENCOUNTER — Other Ambulatory Visit: Payer: Self-pay

## 2021-04-26 DIAGNOSIS — Z170 Estrogen receptor positive status [ER+]: Secondary | ICD-10-CM | POA: Diagnosis not present

## 2021-04-26 DIAGNOSIS — Z510 Encounter for antineoplastic radiation therapy: Secondary | ICD-10-CM | POA: Diagnosis not present

## 2021-04-26 DIAGNOSIS — C50412 Malignant neoplasm of upper-outer quadrant of left female breast: Secondary | ICD-10-CM | POA: Diagnosis not present

## 2021-04-27 ENCOUNTER — Ambulatory Visit: Payer: PPO

## 2021-04-27 ENCOUNTER — Ambulatory Visit
Admission: RE | Admit: 2021-04-27 | Discharge: 2021-04-27 | Disposition: A | Discharge status: Home / Self Care | Payer: PPO | Source: Ambulatory Visit | Attending: Radiation Oncology | Admitting: Radiation Oncology

## 2021-04-27 DIAGNOSIS — Z170 Estrogen receptor positive status [ER+]: Secondary | ICD-10-CM | POA: Diagnosis not present

## 2021-04-27 DIAGNOSIS — Z510 Encounter for antineoplastic radiation therapy: Secondary | ICD-10-CM | POA: Diagnosis not present

## 2021-04-27 DIAGNOSIS — C50412 Malignant neoplasm of upper-outer quadrant of left female breast: Secondary | ICD-10-CM | POA: Diagnosis not present

## 2021-04-28 ENCOUNTER — Ambulatory Visit: Payer: PPO

## 2021-04-28 ENCOUNTER — Other Ambulatory Visit: Payer: Self-pay

## 2021-04-28 ENCOUNTER — Ambulatory Visit
Admission: RE | Admit: 2021-04-28 | Discharge: 2021-04-28 | Disposition: A | Discharge status: Home / Self Care | Payer: PPO | Source: Ambulatory Visit | Attending: Radiation Oncology | Admitting: Radiation Oncology

## 2021-04-28 DIAGNOSIS — Z510 Encounter for antineoplastic radiation therapy: Secondary | ICD-10-CM | POA: Diagnosis not present

## 2021-04-28 DIAGNOSIS — Z170 Estrogen receptor positive status [ER+]: Secondary | ICD-10-CM | POA: Diagnosis not present

## 2021-04-28 DIAGNOSIS — C50412 Malignant neoplasm of upper-outer quadrant of left female breast: Secondary | ICD-10-CM | POA: Diagnosis not present

## 2021-04-29 ENCOUNTER — Ambulatory Visit: Payer: PPO

## 2021-04-29 ENCOUNTER — Ambulatory Visit
Admission: RE | Admit: 2021-04-29 | Discharge: 2021-04-29 | Disposition: A | Discharge status: Home / Self Care | Payer: PPO | Source: Ambulatory Visit | Attending: Radiation Oncology | Admitting: Radiation Oncology

## 2021-04-29 DIAGNOSIS — Z170 Estrogen receptor positive status [ER+]: Secondary | ICD-10-CM | POA: Diagnosis not present

## 2021-04-29 DIAGNOSIS — Z510 Encounter for antineoplastic radiation therapy: Secondary | ICD-10-CM | POA: Diagnosis not present

## 2021-04-29 DIAGNOSIS — C50412 Malignant neoplasm of upper-outer quadrant of left female breast: Secondary | ICD-10-CM | POA: Diagnosis not present

## 2021-04-30 ENCOUNTER — Ambulatory Visit: Payer: PPO

## 2021-04-30 ENCOUNTER — Other Ambulatory Visit: Payer: Self-pay

## 2021-04-30 ENCOUNTER — Ambulatory Visit
Admission: RE | Admit: 2021-04-30 | Discharge: 2021-04-30 | Disposition: A | Discharge status: Home / Self Care | Payer: PPO | Source: Ambulatory Visit | Attending: Radiation Oncology | Admitting: Radiation Oncology

## 2021-04-30 DIAGNOSIS — C50412 Malignant neoplasm of upper-outer quadrant of left female breast: Secondary | ICD-10-CM | POA: Diagnosis not present

## 2021-04-30 DIAGNOSIS — Z170 Estrogen receptor positive status [ER+]: Secondary | ICD-10-CM | POA: Diagnosis not present

## 2021-04-30 DIAGNOSIS — Z510 Encounter for antineoplastic radiation therapy: Secondary | ICD-10-CM | POA: Diagnosis not present

## 2021-05-03 ENCOUNTER — Ambulatory Visit: Payer: PPO | Admitting: Radiation Oncology

## 2021-05-03 ENCOUNTER — Other Ambulatory Visit: Payer: Self-pay

## 2021-05-03 ENCOUNTER — Ambulatory Visit
Admission: RE | Admit: 2021-05-03 | Discharge: 2021-05-03 | Disposition: A | Discharge status: Home / Self Care | Payer: PPO | Source: Ambulatory Visit | Attending: Radiation Oncology | Admitting: Radiation Oncology

## 2021-05-03 DIAGNOSIS — C50412 Malignant neoplasm of upper-outer quadrant of left female breast: Secondary | ICD-10-CM | POA: Diagnosis not present

## 2021-05-03 DIAGNOSIS — Z510 Encounter for antineoplastic radiation therapy: Secondary | ICD-10-CM | POA: Diagnosis not present

## 2021-05-03 DIAGNOSIS — Z170 Estrogen receptor positive status [ER+]: Secondary | ICD-10-CM | POA: Diagnosis not present

## 2021-05-04 ENCOUNTER — Ambulatory Visit: Payer: PPO

## 2021-05-04 ENCOUNTER — Ambulatory Visit
Admission: RE | Admit: 2021-05-04 | Discharge: 2021-05-04 | Disposition: A | Discharge status: Home / Self Care | Payer: PPO | Source: Ambulatory Visit | Attending: Radiation Oncology | Admitting: Radiation Oncology

## 2021-05-04 DIAGNOSIS — Z510 Encounter for antineoplastic radiation therapy: Secondary | ICD-10-CM | POA: Diagnosis not present

## 2021-05-04 DIAGNOSIS — Z170 Estrogen receptor positive status [ER+]: Secondary | ICD-10-CM | POA: Diagnosis not present

## 2021-05-04 DIAGNOSIS — C50412 Malignant neoplasm of upper-outer quadrant of left female breast: Secondary | ICD-10-CM | POA: Diagnosis not present

## 2021-05-05 ENCOUNTER — Ambulatory Visit: Payer: PPO

## 2021-05-05 ENCOUNTER — Ambulatory Visit
Admission: RE | Admit: 2021-05-05 | Discharge: 2021-05-05 | Disposition: A | Discharge status: Home / Self Care | Payer: PPO | Source: Ambulatory Visit | Attending: Radiation Oncology | Admitting: Radiation Oncology

## 2021-05-05 ENCOUNTER — Other Ambulatory Visit: Payer: Self-pay

## 2021-05-05 DIAGNOSIS — Z510 Encounter for antineoplastic radiation therapy: Secondary | ICD-10-CM | POA: Diagnosis not present

## 2021-05-05 DIAGNOSIS — Z170 Estrogen receptor positive status [ER+]: Secondary | ICD-10-CM | POA: Diagnosis not present

## 2021-05-05 DIAGNOSIS — C50412 Malignant neoplasm of upper-outer quadrant of left female breast: Secondary | ICD-10-CM | POA: Diagnosis not present

## 2021-05-06 ENCOUNTER — Ambulatory Visit: Payer: PPO

## 2021-05-06 ENCOUNTER — Ambulatory Visit
Admission: RE | Admit: 2021-05-06 | Discharge: 2021-05-06 | Disposition: A | Discharge status: Home / Self Care | Payer: PPO | Source: Ambulatory Visit | Attending: Radiation Oncology | Admitting: Radiation Oncology

## 2021-05-06 DIAGNOSIS — Z510 Encounter for antineoplastic radiation therapy: Secondary | ICD-10-CM | POA: Diagnosis not present

## 2021-05-06 DIAGNOSIS — C50412 Malignant neoplasm of upper-outer quadrant of left female breast: Secondary | ICD-10-CM | POA: Diagnosis not present

## 2021-05-06 DIAGNOSIS — Z170 Estrogen receptor positive status [ER+]: Secondary | ICD-10-CM | POA: Diagnosis not present

## 2021-05-07 ENCOUNTER — Ambulatory Visit
Admission: RE | Admit: 2021-05-07 | Discharge: 2021-05-07 | Disposition: A | Discharge status: Home / Self Care | Payer: PPO | Source: Ambulatory Visit | Attending: Radiation Oncology | Admitting: Radiation Oncology

## 2021-05-07 ENCOUNTER — Other Ambulatory Visit: Payer: Self-pay

## 2021-05-07 ENCOUNTER — Ambulatory Visit: Payer: PPO

## 2021-05-07 DIAGNOSIS — Z510 Encounter for antineoplastic radiation therapy: Secondary | ICD-10-CM | POA: Diagnosis not present

## 2021-05-07 DIAGNOSIS — C50412 Malignant neoplasm of upper-outer quadrant of left female breast: Secondary | ICD-10-CM | POA: Diagnosis not present

## 2021-05-07 DIAGNOSIS — Z170 Estrogen receptor positive status [ER+]: Secondary | ICD-10-CM | POA: Diagnosis not present

## 2021-05-08 ENCOUNTER — Other Ambulatory Visit: Payer: Self-pay | Admitting: Internal Medicine

## 2021-05-08 DIAGNOSIS — E559 Vitamin D deficiency, unspecified: Secondary | ICD-10-CM

## 2021-05-10 ENCOUNTER — Ambulatory Visit: Payer: PPO | Admitting: Radiation Oncology

## 2021-05-10 ENCOUNTER — Ambulatory Visit
Admission: RE | Admit: 2021-05-10 | Discharge: 2021-05-10 | Disposition: A | Discharge status: Home / Self Care | Payer: PPO | Source: Ambulatory Visit | Attending: Radiation Oncology | Admitting: Radiation Oncology

## 2021-05-10 ENCOUNTER — Other Ambulatory Visit: Payer: Self-pay

## 2021-05-10 ENCOUNTER — Other Ambulatory Visit: Payer: Self-pay | Admitting: Internal Medicine

## 2021-05-10 DIAGNOSIS — Z510 Encounter for antineoplastic radiation therapy: Secondary | ICD-10-CM | POA: Diagnosis not present

## 2021-05-10 DIAGNOSIS — C50412 Malignant neoplasm of upper-outer quadrant of left female breast: Secondary | ICD-10-CM | POA: Diagnosis not present

## 2021-05-10 DIAGNOSIS — Z170 Estrogen receptor positive status [ER+]: Secondary | ICD-10-CM | POA: Diagnosis not present

## 2021-05-10 DIAGNOSIS — E559 Vitamin D deficiency, unspecified: Secondary | ICD-10-CM

## 2021-05-11 ENCOUNTER — Encounter (HOSPITAL_COMMUNITY): Payer: Self-pay

## 2021-05-11 ENCOUNTER — Other Ambulatory Visit: Payer: Self-pay | Admitting: Internal Medicine

## 2021-05-11 ENCOUNTER — Ambulatory Visit
Admission: RE | Admit: 2021-05-11 | Discharge: 2021-05-11 | Disposition: A | Discharge status: Home / Self Care | Payer: PPO | Source: Ambulatory Visit | Attending: Radiation Oncology | Admitting: Radiation Oncology

## 2021-05-11 DIAGNOSIS — C50412 Malignant neoplasm of upper-outer quadrant of left female breast: Secondary | ICD-10-CM | POA: Diagnosis not present

## 2021-05-11 DIAGNOSIS — Z170 Estrogen receptor positive status [ER+]: Secondary | ICD-10-CM | POA: Diagnosis not present

## 2021-05-11 DIAGNOSIS — E559 Vitamin D deficiency, unspecified: Secondary | ICD-10-CM

## 2021-05-11 DIAGNOSIS — Z510 Encounter for antineoplastic radiation therapy: Secondary | ICD-10-CM | POA: Diagnosis not present

## 2021-05-12 ENCOUNTER — Ambulatory Visit
Admission: RE | Admit: 2021-05-12 | Discharge: 2021-05-12 | Disposition: A | Discharge status: Home / Self Care | Payer: PPO | Source: Ambulatory Visit | Attending: Radiation Oncology | Admitting: Radiation Oncology

## 2021-05-12 ENCOUNTER — Other Ambulatory Visit: Payer: Self-pay

## 2021-05-12 DIAGNOSIS — Z510 Encounter for antineoplastic radiation therapy: Secondary | ICD-10-CM | POA: Insufficient documentation

## 2021-05-12 DIAGNOSIS — C50412 Malignant neoplasm of upper-outer quadrant of left female breast: Secondary | ICD-10-CM | POA: Insufficient documentation

## 2021-05-12 DIAGNOSIS — Z170 Estrogen receptor positive status [ER+]: Secondary | ICD-10-CM | POA: Insufficient documentation

## 2021-05-13 ENCOUNTER — Ambulatory Visit
Admission: RE | Admit: 2021-05-13 | Discharge: 2021-05-13 | Disposition: A | Discharge status: Home / Self Care | Payer: PPO | Source: Ambulatory Visit | Attending: Radiation Oncology | Admitting: Radiation Oncology

## 2021-05-13 DIAGNOSIS — Z510 Encounter for antineoplastic radiation therapy: Secondary | ICD-10-CM | POA: Diagnosis not present

## 2021-05-13 DIAGNOSIS — C50412 Malignant neoplasm of upper-outer quadrant of left female breast: Secondary | ICD-10-CM | POA: Diagnosis not present

## 2021-05-13 DIAGNOSIS — Z170 Estrogen receptor positive status [ER+]: Secondary | ICD-10-CM | POA: Diagnosis not present

## 2021-05-14 ENCOUNTER — Other Ambulatory Visit: Payer: Self-pay

## 2021-05-14 ENCOUNTER — Ambulatory Visit
Admission: RE | Admit: 2021-05-14 | Discharge: 2021-05-14 | Disposition: A | Discharge status: Home / Self Care | Payer: PPO | Source: Ambulatory Visit | Attending: Radiation Oncology | Admitting: Radiation Oncology

## 2021-05-14 DIAGNOSIS — C50412 Malignant neoplasm of upper-outer quadrant of left female breast: Secondary | ICD-10-CM | POA: Diagnosis not present

## 2021-05-14 DIAGNOSIS — Z510 Encounter for antineoplastic radiation therapy: Secondary | ICD-10-CM | POA: Diagnosis not present

## 2021-05-14 DIAGNOSIS — Z170 Estrogen receptor positive status [ER+]: Secondary | ICD-10-CM | POA: Diagnosis not present

## 2021-05-17 ENCOUNTER — Ambulatory Visit
Admission: RE | Admit: 2021-05-17 | Discharge: 2021-05-17 | Disposition: A | Discharge status: Home / Self Care | Payer: PPO | Source: Ambulatory Visit | Attending: Radiation Oncology | Admitting: Radiation Oncology

## 2021-05-17 ENCOUNTER — Other Ambulatory Visit: Payer: Self-pay

## 2021-05-17 DIAGNOSIS — Z170 Estrogen receptor positive status [ER+]: Secondary | ICD-10-CM

## 2021-05-17 DIAGNOSIS — C50412 Malignant neoplasm of upper-outer quadrant of left female breast: Secondary | ICD-10-CM | POA: Diagnosis not present

## 2021-05-17 DIAGNOSIS — Z510 Encounter for antineoplastic radiation therapy: Secondary | ICD-10-CM | POA: Diagnosis not present

## 2021-05-17 MED ORDER — RADIAPLEXRX EX GEL: Freq: Once | CUTANEOUS | Status: AC

## 2021-05-17 NOTE — Progress Notes (Signed)
? ?Patient Care Team: ?Isaac Bliss, Rayford Halsted, MD as PCP - General (Internal Medicine) ?Constance Haw, MD as PCP - Electrophysiology (Cardiology) ?Constance Haw, MD as PCP - Cardiology (Cardiology) ?Pixie Casino, MD as Consulting Physician (Cardiology) ?Brien Few, MD as Consulting Physician (Obstetrics and Gynecology) ?Haverstock, Jennefer Bravo, MD as Referring Physician (Dermatology) ?Ardis Hughs, MD as Attending Physician (Urology) ?Mauro Kaufmann, RN as Oncology Nurse Navigator ?Rockwell Germany, RN as Oncology Nurse Navigator ? ?DIAGNOSIS:  ?  ICD-10-CM   ?1. Malignant neoplasm of upper-outer quadrant of left breast in female, estrogen receptor positive ([place])  C50.412   ? Z17.0   ?  ? ? ?SUMMARY OF ONCOLOGIC HISTORY: ?Oncology History  ?Malignant neoplasm of upper-outer quadrant of left breast in female, estrogen receptor positive ([place])  ?12/29/2020 Initial Diagnosis  ? Screening mammogram: 2 possible areas of distortion in the left breast 1o clock 11 O Clock. Diagnostic mammogram and [place]: large suspicious area of distortion within the superior left breast.  Both biopsies: Grade 2 IDC ER 95%, PR 95%, Ki-67 5%, HER2 equivocal by IHC, FISH negative ratio 1.08 and copy #1.95 ?  ? ? ?CHIEF COMPLIANT: Follow-up of left breast cancer ? ?INTERVAL HISTORY: [name] is a 71 y.o. with above-mentioned history of left breast cancer having undergone lumpectomy, currently on radiation therapy. She presents to the clinic today for follow-up.  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 ? ?ALLERGIES:  is allergic to hydrolyzed silk, gluten meal, and metoprolol. ? ?MEDICATIONS:  ?Current Outpatient Medications  ?Medication Sig Dispense Refill  ? acetaminophen (TYLENOL) 500 MG tablet Take 2 tablets (1,000 mg total) by mouth every 6 (six) hours as needed.  0  ? Cranberry 500 MG TABS Take 1,000 mg by mouth 2 (two) times daily.    ? Cyanocobalamin (VITAMIN B 12) 500 MCG TABS  Take 1 tablet by mouth daily.    ? diltiazem (CARDIZEM) 60 MG tablet Take 60 mg daily if needed for palpitations 30 tablet 9  ? diltiazem (TIAZAC) 240 MG 24 hr capsule TAKE 1 CAPSULE BY MOUTH EVERY DAY 90 capsule 2  ? ELIQUIS 5 MG TABS tablet TAKE 1 TABLET(5 MG) BY MOUTH TWICE DAILY 180 tablet 1  ? ibuprofen (ADVIL) 800 MG tablet Take 1 tablet (800 mg total) by mouth every 8 (eight) hours as needed. 30 tablet 0  ? methenamine (HIPREX) 1 g tablet Take 1 g by mouth 2 (two) times daily.    ? MYRBETRIQ 50 MG TB24 tablet Take 50 mg by mouth daily.    ? omeprazole (PRILOSEC) 20 MG capsule Take 20 mg by mouth daily as needed.    ? OVER THE COUNTER MEDICATION Take 20 mg by mouth daily as needed (arthritis). CBD oil    ? polyethylene glycol (MIRALAX / GLYCOLAX) 17 g packet Take 17 g by mouth as needed.    ? sennosides-docusate sodium (SENOKOT-S) 8.6-50 MG tablet Take 1 tablet by mouth as needed for constipation.    ? spironolactone (ALDACTONE) 50 MG tablet TAKE 2 TABLETS BY MOUTH EVERY MORNING AND 1 TABLET EVERY EVENING 270 tablet 1  ? triamcinolone cream (KENALOG) 0.1 % Apply 1 application topically daily as needed (for dry skin).   1  ? vitamin C (ASCORBIC ACID) 500 MG tablet Take 500 mg by mouth 2 (two) times daily.    ? ?No current facility-administered medications for this visit.  ? ? ?PHYSICAL EXAMINATION: ?ECOG PERFORMANCE STATUS: 1 - Symptomatic but completely  ambulatory ? ?Vitals:  ? 05/18/21 0948  ?BP: 137/70  ?Pulse: 76  ?Resp: 18  ?Temp: (!) 97.3 ?F (36.3 ?C)  ?SpO2: 97%  ? ?Filed Weights  ? 05/18/21 0948  ?Weight: 238 lb 14.4 oz (108.4 kg)  ?  ? ?LABORATORY DATA:  ?I have reviewed the data as listed ?CMP Latest Ref Rng & Units 02/10/2021 11/27/2020 04/15/2020  ?Glucose 70 - 99 mg/dL 91 90 87  ?BUN 8 - 23 mg/dL 15 21 17   ?Creatinine 0.44 - 1.00 mg/dL 0.86 0.85 0.76  ?Sodium 135 - 145 mmol/L 136 138 139  ?Potassium 3.5 - 5.1 mmol/L 4.5 4.1 3.9  ?Chloride 98 - 111 mmol/L 104 104 103  ?CO2 22 - 32 mmol/L 23 25 24    ?Calcium 8.9 - 10.3 mg/dL 9.0 9.2 9.3  ?Total Protein 6.0 - 8.3 g/dL - 6.6 -  ?Total Bilirubin 0.2 - 1.2 mg/dL - 0.7 -  ?Alkaline Phos 39 - 117 U/L - 65 -  ?AST 0 - 37 U/L - 21 -  ?ALT 0 - 35 U/L - 27 -  ? ? ?Lab Results  ?Component Value Date  ? WBC 5.5 11/27/2020  ? HGB 13.8 11/27/2020  ? HCT 40.7 11/27/2020  ? MCV 102.7 (H) 11/27/2020  ? PLT 183.0 11/27/2020  ? NEUTROABS 3.1 11/27/2020  ? ? ?ASSESSMENT & PLAN:  ?Malignant neoplasm of upper-outer quadrant of left breast in female, estrogen receptor positive ([place]) ?02/16/2021: Left lumpectomy: IDC with DCIS 4.6 cm, grade 2, margins negative, 1/2 lymph nodes positive, ER 95%, PR 100%, HER2 equivocal by IHC FISH ratio 1.18: Negative, Ki-67 2% ?MammaPrint: Low risk ?Adjuvant radiation 04/13/2021 05/21/2021  ? ?Treatment plan: Adjuvant antiestrogen therapy with letrozole 2.5 mg daily x 7 years ? ?Letrozole counseling: We discussed the risks and benefits of anti-estrogen therapy with aromatase inhibitors. These include but not limited to insomnia, hot flashes, mood changes, vaginal dryness, bone density loss, and weight gain. We strongly believe that the benefits far outweigh the risks. Patient understands these risks and consented to starting treatment. Planned treatment duration is 7 years. ? ?Return to clinic in 3 months for survivorship care plan visit ? ? ? ?No orders of the defined types were placed in this encounter. ? ?The patient has a good understanding of the overall plan. she agrees with it. she will call with any problems that may develop before the next visit here. ? ?Total time spent: 30 mins including face to face time and time spent for planning, charting and coordination of care ? ?Rulon Eisenmenger, MD, MPH ?05/18/2021 ? ?I, Thana Ates, am acting as scribe for Dr. Nicholas Lose. ? ?I have reviewed the above documentation for accuracy and completeness, and I agree with the above. ? ? ? ? ? ? ?

## 2021-05-18 ENCOUNTER — Other Ambulatory Visit: Payer: Self-pay

## 2021-05-18 ENCOUNTER — Inpatient Hospital Stay: Payer: PPO | Admitting: Hematology and Oncology

## 2021-05-18 ENCOUNTER — Ambulatory Visit
Admission: RE | Admit: 2021-05-18 | Discharge: 2021-05-18 | Disposition: A | Discharge status: Home / Self Care | Payer: PPO | Source: Ambulatory Visit | Attending: Radiation Oncology | Admitting: Radiation Oncology

## 2021-05-18 DIAGNOSIS — Z79899 Other long term (current) drug therapy: Secondary | ICD-10-CM | POA: Insufficient documentation

## 2021-05-18 DIAGNOSIS — Z170 Estrogen receptor positive status [ER+]: Secondary | ICD-10-CM | POA: Insufficient documentation

## 2021-05-18 DIAGNOSIS — C50412 Malignant neoplasm of upper-outer quadrant of left female breast: Secondary | ICD-10-CM | POA: Insufficient documentation

## 2021-05-18 DIAGNOSIS — Z510 Encounter for antineoplastic radiation therapy: Secondary | ICD-10-CM | POA: Diagnosis not present

## 2021-05-18 MED ORDER — LETROZOLE 2.5 MG PO TABS: 2.5000 mg | ORAL_TABLET | Freq: Every day | ORAL | 3 refills | Status: DC

## 2021-05-18 NOTE — Assessment & Plan Note (Signed)
02/16/2021: Left lumpectomy: IDC with DCIS 4.6 cm, grade 2, margins negative, 1/2 lymph nodes positive, ER 95%, PR 100%, HER2 equivocal by IHC FISH ratio 1.18: Negative, Ki-67 2% ?MammaPrint: Low risk ?Adjuvant radiation 04/13/2021 05/21/2021  ? ?Treatment plan: Adjuvant antiestrogen therapy with letrozole 2.5 mg daily x 7 years ? ?Letrozole counseling: We discussed the risks and benefits of anti-estrogen therapy with aromatase inhibitors. These include but not limited to insomnia, hot flashes, mood changes, vaginal dryness, bone density loss, and weight gain. We strongly believe that the benefits far outweigh the risks. Patient understands these risks and consented to starting treatment. Planned treatment duration is 7 years. ? ?Return to clinic in 3 months for survivorship care plan visit ?

## 2021-05-19 ENCOUNTER — Ambulatory Visit
Admission: RE | Admit: 2021-05-19 | Discharge: 2021-05-19 | Disposition: A | Discharge status: Home / Self Care | Payer: PPO | Source: Ambulatory Visit | Attending: Radiation Oncology | Admitting: Radiation Oncology

## 2021-05-19 DIAGNOSIS — Z510 Encounter for antineoplastic radiation therapy: Secondary | ICD-10-CM | POA: Diagnosis not present

## 2021-05-19 DIAGNOSIS — C50412 Malignant neoplasm of upper-outer quadrant of left female breast: Secondary | ICD-10-CM | POA: Diagnosis not present

## 2021-05-19 DIAGNOSIS — Z170 Estrogen receptor positive status [ER+]: Secondary | ICD-10-CM | POA: Diagnosis not present

## 2021-05-20 ENCOUNTER — Other Ambulatory Visit: Payer: Self-pay

## 2021-05-20 ENCOUNTER — Ambulatory Visit
Admission: RE | Admit: 2021-05-20 | Discharge: 2021-05-20 | Disposition: A | Discharge status: Home / Self Care | Payer: PPO | Source: Ambulatory Visit | Attending: Radiation Oncology | Admitting: Radiation Oncology

## 2021-05-20 DIAGNOSIS — C50412 Malignant neoplasm of upper-outer quadrant of left female breast: Secondary | ICD-10-CM | POA: Diagnosis not present

## 2021-05-20 DIAGNOSIS — Z510 Encounter for antineoplastic radiation therapy: Secondary | ICD-10-CM | POA: Diagnosis not present

## 2021-05-20 DIAGNOSIS — Z170 Estrogen receptor positive status [ER+]: Secondary | ICD-10-CM | POA: Diagnosis not present

## 2021-05-21 ENCOUNTER — Encounter: Payer: Self-pay | Admitting: *Deleted

## 2021-05-21 ENCOUNTER — Encounter: Payer: Self-pay | Admitting: Radiation Oncology

## 2021-05-21 ENCOUNTER — Ambulatory Visit
Admission: RE | Admit: 2021-05-21 | Discharge: 2021-05-21 | Disposition: A | Discharge status: Home / Self Care | Payer: PPO | Source: Ambulatory Visit | Attending: Radiation Oncology | Admitting: Radiation Oncology

## 2021-05-21 DIAGNOSIS — Z170 Estrogen receptor positive status [ER+]: Secondary | ICD-10-CM

## 2021-05-21 DIAGNOSIS — C50412 Malignant neoplasm of upper-outer quadrant of left female breast: Secondary | ICD-10-CM | POA: Diagnosis not present

## 2021-05-21 DIAGNOSIS — Z510 Encounter for antineoplastic radiation therapy: Secondary | ICD-10-CM | POA: Diagnosis not present

## 2021-06-03 DIAGNOSIS — N3000 Acute cystitis without hematuria: Secondary | ICD-10-CM | POA: Diagnosis not present

## 2021-06-28 DIAGNOSIS — R8271 Bacteriuria: Secondary | ICD-10-CM | POA: Diagnosis not present

## 2021-06-28 DIAGNOSIS — N3020 Other chronic cystitis without hematuria: Secondary | ICD-10-CM | POA: Diagnosis not present

## 2021-06-30 ENCOUNTER — Ambulatory Visit
Admission: RE | Admit: 2021-06-30 | Discharge: 2021-06-30 | Disposition: A | Discharge status: Home / Self Care | Payer: PPO | Source: Ambulatory Visit | Attending: Radiation Oncology | Admitting: Radiation Oncology

## 2021-06-30 ENCOUNTER — Other Ambulatory Visit: Payer: Self-pay

## 2021-06-30 VITALS — BP 157/82 | HR 83 | Temp 96.2°F | Resp 18 | Ht 69.0 in | Wt 240.1 lb

## 2021-06-30 DIAGNOSIS — Z923 Personal history of irradiation: Secondary | ICD-10-CM | POA: Diagnosis not present

## 2021-06-30 DIAGNOSIS — Z7901 Long term (current) use of anticoagulants: Secondary | ICD-10-CM | POA: Insufficient documentation

## 2021-06-30 DIAGNOSIS — Z79899 Other long term (current) drug therapy: Secondary | ICD-10-CM | POA: Insufficient documentation

## 2021-06-30 DIAGNOSIS — Z79811 Long term (current) use of aromatase inhibitors: Secondary | ICD-10-CM | POA: Diagnosis not present

## 2021-06-30 DIAGNOSIS — C50412 Malignant neoplasm of upper-outer quadrant of left female breast: Secondary | ICD-10-CM | POA: Insufficient documentation

## 2021-06-30 DIAGNOSIS — Z170 Estrogen receptor positive status [ER+]: Secondary | ICD-10-CM | POA: Insufficient documentation

## 2021-06-30 DIAGNOSIS — R5383 Other fatigue: Secondary | ICD-10-CM | POA: Diagnosis not present

## 2021-06-30 NOTE — Progress Notes (Signed)
[name] presents today for follow-up after completing radiation to her left breast on 05/21/2021 ? ?Pain: Denies any any lingering breast pain or tenderness ?Skin: Reports skin is intact and well healed; though she does have residual bruising from lumpectomy ?ROM: Denies any issues ?Lymphedema: Denies ?MedOnc F/U: Scheduled for [facility] with Annabelle Harman on 08/18/2021 ?Other issues of note: On oral antibiotics for UTI; Reports inspire apnea device working fine. Fatigue is improving. Overall, she reports she is doing well and pleased with her continued progress. She is looking forward to attending her grand kids' various sporting events ? ?Pt reports Yes No Comments  ?Tamoxifen '[]'$  '[x]'$    ?Letrozole '[x]'$  '[]'$  Reports bilateral calf achiness   ?Anastrazole '[]'$  '[x]'$    ?Mammogram '[x]'$  Date: TBD '[]'$    ? ? ?

## 2021-07-02 ENCOUNTER — Encounter: Payer: Self-pay | Admitting: Radiation Oncology

## 2021-07-02 NOTE — Progress Notes (Signed)
?Radiation Oncology         [phone] ?________________________________ ? ?Name: [name] MRN: [id number]  ?Date: 06/30/2021  DOB: 1950/08/13 ? ?Follow-Up Visit Note ? ?Outpatient ? ?CC: [name], Shelly Halsted, MD  [name], [place]* ? ?Diagnosis and Prior Radiotherapy:  ?  ICD-10-CM   ?1. Malignant neoplasm of upper-outer quadrant of left breast in female, estrogen receptor positive ([place])  C50.412   ? Z17.0   ?  ? ? ?CHIEF COMPLAINT: Here for follow-up and surveillance of breast cancer ? ?Narrative:  The patient returns today for routine follow-up.  [name] presents today for follow-up after completing radiation to her left breast on 05/21/2021 ? ?Pain: Denies any any lingering breast pain or tenderness ?Skin: Reports skin is intact and well healed; though she does have residual bruising from lumpectomy ?ROM: Denies any issues ?Lymphedema: Denies ?MedOnc F/U: Scheduled for [facility] with Annabelle Harman on 08/18/2021 ?Other issues of note: On oral antibiotics for UTI; Reports inspire apnea device working fine. Fatigue is improving. Overall, she reports she is doing well and pleased with her continued progress. She is looking forward to attending her grand kids' various sporting events.  She is taking letrozole.                        ? ?ALLERGIES:  is allergic to hydrolyzed silk, ciprofloxacin, gluten meal, and metoprolol. ? ?Meds: ?Current Outpatient Medications  ?Medication Sig Dispense Refill  ? acetaminophen (TYLENOL) 500 MG tablet Take 2 tablets (1,000 mg total) by mouth every 6 (six) hours as needed.  0  ? cephALEXin (KEFLEX) 500 MG capsule Take 500 mg by mouth 2 (two) times daily.    ? Cranberry 500 MG TABS Take 1,000 mg by mouth 2 (two) times daily.    ? Cyanocobalamin (VITAMIN B 12) 500 MCG TABS Take 1 tablet by mouth daily.    ? diltiazem (CARDIZEM) 60 MG tablet Take 60 mg daily if needed for palpitations 30 tablet 9  ? diltiazem (TIAZAC) 240 MG 24 hr capsule  TAKE 1 CAPSULE BY MOUTH EVERY DAY 90 capsule 2  ? ELIQUIS 5 MG TABS tablet TAKE 1 TABLET(5 MG) BY MOUTH TWICE DAILY 180 tablet 1  ? letrozole (FEMARA) 2.5 MG tablet Take 1 tablet (2.5 mg total) by mouth daily. 90 tablet 3  ? methenamine (HIPREX) 1 g tablet Take 1 g by mouth 2 (two) times daily.    ? MYRBETRIQ 50 MG TB24 tablet Take 50 mg by mouth daily.    ? omeprazole (PRILOSEC) 20 MG capsule Take 20 mg by mouth daily as needed.    ? OVER THE COUNTER MEDICATION Take 20 mg by mouth daily as needed (arthritis). CBD oil    ? polyethylene glycol (MIRALAX / GLYCOLAX) 17 g packet Take 17 g by mouth as needed.    ? sennosides-docusate sodium (SENOKOT-S) 8.6-50 MG tablet Take 1 tablet by mouth as needed for constipation.    ? spironolactone (ALDACTONE) 50 MG tablet TAKE 2 TABLETS BY MOUTH EVERY MORNING AND 1 TABLET EVERY EVENING 270 tablet 1  ? triamcinolone cream (KENALOG) 0.1 % Apply 1 application topically daily as needed (for dry skin).   1  ? vitamin C (ASCORBIC ACID) 500 MG tablet Take 500 mg by mouth 2 (two) times daily.    ? ?No current facility-administered medications for this encounter.  ? ? ?Physical Findings: ?The patient is in no acute distress. Patient is alert  and oriented. ? height is '5\' 9"'$  (1.753 m) and weight is 240 lb 2 oz (108.9 kg). Her temporal temperature is 96.2 ?F (35.7 ?C) (abnormal). Her blood pressure is 157/82 (abnormal) and her pulse is 83. Her respiration is 18 and oxygen saturation is 96%. Marland Kitchen    ?Continued skin healing in radiotherapy fields.  Persistent scar tissue like firmness at lumpectomy site and resolving skin hyperpigmentation and postoperative bruising present in breast ? ? ?Lab Findings: ?Lab Results  ?Component Value Date  ? WBC 5.5 11/27/2020  ? HGB 13.8 11/27/2020  ? HCT 40.7 11/27/2020  ? MCV 102.7 (H) 11/27/2020  ? PLT 183.0 11/27/2020  ? ? ?Radiographic Findings: ?No results found. ? ?Impression/Plan: Healing well from radiotherapy to the breast tissue. ? ?Continue skin care  with topical Vitamin E Oil and / or lotion for at least 2 more months for further healing. ? ?I encouraged her to continue with yearly mammography as appropriate (for intact breast tissue) and followup with medical oncology. I will see her back on an as-needed basis. I have encouraged her to call if she has any issues or concerns in the future. I wished her the very best.  She also has follow-up in the near future with Dr. Brantley Stage. ? ?On date of service, in total, I spent 25 minutes on this encounter. Patient was seen in person.  ?_____________________________________ ? ? ?Eppie Gibson, MD  ?

## 2021-07-06 DIAGNOSIS — L570 Actinic keratosis: Secondary | ICD-10-CM | POA: Diagnosis not present

## 2021-07-06 DIAGNOSIS — L814 Other melanin hyperpigmentation: Secondary | ICD-10-CM | POA: Diagnosis not present

## 2021-07-06 DIAGNOSIS — D225 Melanocytic nevi of trunk: Secondary | ICD-10-CM | POA: Diagnosis not present

## 2021-07-06 DIAGNOSIS — Z85820 Personal history of malignant melanoma of skin: Secondary | ICD-10-CM | POA: Diagnosis not present

## 2021-07-06 DIAGNOSIS — L578 Other skin changes due to chronic exposure to nonionizing radiation: Secondary | ICD-10-CM | POA: Diagnosis not present

## 2021-07-06 DIAGNOSIS — L821 Other seborrheic keratosis: Secondary | ICD-10-CM | POA: Diagnosis not present

## 2021-07-08 ENCOUNTER — Telehealth: Payer: Self-pay | Admitting: Adult Health

## 2021-07-08 NOTE — Telephone Encounter (Signed)
Rescheduled appointment per providers template. Patient aware.  ?

## 2021-07-13 NOTE — Progress Notes (Signed)
? ?                                                                                                                                                          ?  Patient Name: [name] ?MRN: [id number] ?DOB: August 23, 1950 ?Referring Physician: Lelon Frohlich (Profile Not Attached) ?Date of Service: 05/21/2021 ?[facility], [place] ? ?                                                      End Of Treatment Note ? ?Diagnoses: C50.412-Malignant neoplasm of upper-outer quadrant of left female breast ? ?Cancer Staging:  Cancer Staging  ?Malignant neoplasm of upper-outer quadrant of left breast in female, estrogen receptor positive ([place]) ?Staging form: Breast, AJCC 8th Edition ?- Pathologic stage from 03/02/2021: Stage IB (pT2, pN1, cM0, G2, ER+, PR+, HER2-) - Unsigned ?Stage prefix: Initial diagnosis ?Multigene prognostic tests performed: MammaPrint ?Histologic grading system: 3 grade system ? ?Intent: Curative ? ?Radiation Treatment Dates: 04/12/2021 through 05/21/2021 ?Site Technique Total Dose (Gy) Dose per Fx (Gy) Completed Fx Beam Energies  ?Breast, Left: Breast_L 3D 50/50 2 25/25 10X  ?Breast, Left: Breast_L_SCV_PAB 3D 50/50 2 25/25 10X  ?Breast, Left: Breast_L_Bst 3D 10/10 2 5/5 6X, 10X  ? ?Narrative: The patient tolerated radiation therapy relatively well.  ? ?Plan: The patient will follow-up with radiation oncology in 37mo. ?----------------------------------- ? ?Eppie Gibson, MD ? ?

## 2021-08-14 ENCOUNTER — Encounter: Payer: Self-pay | Admitting: Cardiology

## 2021-08-18 ENCOUNTER — Encounter: Payer: PPO | Admitting: Adult Health

## 2021-08-20 ENCOUNTER — Encounter: Payer: Self-pay | Admitting: Cardiology

## 2021-08-20 ENCOUNTER — Ambulatory Visit (INDEPENDENT_AMBULATORY_CARE_PROVIDER_SITE_OTHER): Payer: PPO | Admitting: Cardiology

## 2021-08-20 VITALS — BP 124/72 | HR 74 | Ht 69.0 in | Wt 247.4 lb

## 2021-08-20 DIAGNOSIS — D6869 Other thrombophilia: Secondary | ICD-10-CM

## 2021-08-20 DIAGNOSIS — I480 Paroxysmal atrial fibrillation: Secondary | ICD-10-CM

## 2021-08-20 NOTE — Progress Notes (Signed)
Electrophysiology Office Note   Date:  08/20/2021   ID:  [name], DOB [birth year], MRN [id number]  PCP:  [name], Shelly Halsted, MD  Cardiologist: [facility] Primary Electrophysiologist:  Dr [name]    CC: Evaluation for atrial fibrillation   History of Present Illness: [name] is a 71 y.o. female who is being seen today for the evaluation of atrial fibrillation at the request of [name], [place]*. Presenting today for electrophysiology evaluation.    The history significant for atrial fibrillation.  She was diagnosed in 2012.  She was previously controlled with diltiazem.  In 2019 she had multifocal pneumonia and rapid atrial fibrillation.  She converted to sinus rhythm with flecainide.  She was planned for atrial fibrillation ablation was found to have a large hiatal hernia.  She is post surgery.  She is also post ablation 08/09/2019.  She has a hypoglossal nerve stimulator and is on BiPAP for severe sleep apnea.  She had a mammogram that showed 2 breast lumps.  She is status post lumpectomy of the upper outer quadrant of her left breast.  She has received chemotherapy and radiation.  Today, denies symptoms of palpitations, chest pain, shortness of breath, orthopnea, PND, lower extremity edema, claudication, dizziness, presyncope, syncope, bleeding, or neurologic sequela. The patient is tolerating medications without difficulties.  Is being seen she has done well.  She did well with her chemotherapy and radiation.  She has no complaints at this time.  She has not had any further episodes of atrial fibrillation.   Past Medical History:  Diagnosis Date   Chronic edema    Clotting disorder (HCC)    Constipation    Difficult intubation    pt states that her neck needs to be in a neutral position,  scoliosisand herniated dics in neck   Family history of colon cancer    Family history of uterine cancer    Gallstones    GERD (gastroesophageal reflux disease)     protonix, hx h. pylori   Herniated disc, cervical    History of hiatal hernia    History of sebaceous adenoma    History of uterine cancer 04/22/2014   sees Dr. Ronita [name]; s/p complete hysterectomy   Hx of colonic polyp    Lumbar and sacral osteoarthritis 12/10/2013   Melanoma ([place])    sees Dr. Renda [name] in dermatology right upper arm   Obesity    OSA on CPAP    uses CPAP   Paroxysmal atrial fibrillation (HCC)    Peripheral vascular disease (HCC)    PONV (postoperative nausea and vomiting)    Recurrent UTI    S/P hip replacement    Scoliosis    Sleep apnea    Thyroid nodule    Urinary incontinence    Uterine cancer ([place])    Stage 1   Venous insufficiency    s/p ablation   Past Surgical History:  Procedure Laterality Date   ABDOMINAL HYSTERECTOMY  09/10/2013   ATRIAL FIBRILLATION ABLATION N/A 08/09/2019   Procedure: ATRIAL FIBRILLATION ABLATION;  Surgeon: Shelly Haw, MD;  Location: [facility];  Service: Cardiovascular;  Laterality: N/A;   BREAST LUMPECTOMY WITH RADIOACTIVE SEED AND SENTINEL LYMPH NODE BIOPSY Left 02/16/2021   Procedure: LEFT BREAST LUMPECTOMY WITH RADIOACTIVE SEED X2 AND SENTINEL LYMPH NODE BIOPSY;  Surgeon: Shelly Luna, MD;  Location: [facility];  Service: General;  Laterality: Left;   BUNIONECTOMY  10/1976   CHOLECYSTECTOMY N/A 03/12/2019   Procedure:  XI ROBOTIC ASSISTED CHOLECYSTECTOMY;  Surgeon: Shelly Riley, MD;  Location: [facility];  Service: General;  Laterality: N/A;   COLONOSCOPY     COLONOSCOPY WITH PROPOFOL N/A 01/17/2020   Procedure: COLONOSCOPY WITH PROPOFOL;  Surgeon: Shelly Denmark, MD;  Location: [facility];  Service: Endoscopy;  Laterality: N/A;   DRUG INDUCED ENDOSCOPY N/A 03/04/2020   Procedure: DRUG INDUCED ENDOSCOPY;  Surgeon: Shelly Quitter, MD;  Location: [facility];  Service: ENT;  Laterality: N/A;   FRACTURE SURGERY  09/1964   floor of orbit right side   HIATAL HERNIA REPAIR      IMPLANTATION OF HYPOGLOSSAL NERVE STIMULATOR Right 04/15/2020   Procedure: IMPLANTATION OF HYPOGLOSSAL NERVE STIMULATOR;  Surgeon: Shelly Quitter, MD;  Location: [facility];  Service: ENT;  Laterality: Right;   JOINT REPLACEMENT     bilateral hip replacements   MELANOMA EXCISION  12/2011   POLYPECTOMY  01/17/2020   Procedure: POLYPECTOMY;  Surgeon: Shelly Denmark, MD;  Location: [facility];  Service: Endoscopy;;   TOTAL HIP ARTHROPLASTY Left 06/17/2014   Procedure: LEFT TOTAL HIP ARTHROPLASTY ANTERIOR APPROACH;  Surgeon: Shelly Rossetti, MD;  Location: [facility];  Service: Orthopedics;  Laterality: Left;   TOTAL HIP ARTHROPLASTY Right 09/02/2014   Procedure: RIGHT TOTAL HIP ARTHROPLASTY ANTERIOR APPROACH;  Surgeon: Shelly Rossetti, MD;  Location: [facility];  Service: Orthopedics;  Laterality: Right;   VEIN SURGERY  05/2011   venous ablation      Current Outpatient Medications  Medication Sig Dispense Refill   acetaminophen (TYLENOL) 500 MG tablet Take 2 tablets (1,000 mg total) by mouth every 6 (six) hours as needed.  0   Cranberry 500 MG TABS Take 1,000 mg by mouth 2 (two) times daily.     Cyanocobalamin (VITAMIN B 12) 500 MCG TABS Take 1 tablet by mouth daily.     diltiazem (CARDIZEM) 60 MG tablet Take 60 mg daily if needed for palpitations 30 tablet 9   diltiazem (TIAZAC) 240 MG 24 hr capsule TAKE 1 CAPSULE BY MOUTH EVERY DAY 90 capsule 2   ELIQUIS 5 MG TABS tablet TAKE 1 TABLET(5 MG) BY MOUTH TWICE DAILY 180 tablet 1   ibuprofen (ADVIL) 200 MG tablet Take 400 mg by mouth every 6 (six) hours as needed (pain).     letrozole (FEMARA) 2.5 MG tablet Take 1 tablet (2.5 mg total) by mouth daily. 90 tablet 3   MYRBETRIQ 50 MG TB24 tablet Take 50 mg by mouth daily.     nitrofurantoin, macrocrystal-monohydrate, (MACROBID) 100 MG capsule Take 100 mg by mouth daily.     omeprazole (PRILOSEC) 20 MG capsule Take 20 mg by mouth daily as needed (heartburn).     OVER THE COUNTER  MEDICATION Take 20 mg by mouth daily as needed (arthritis). CBD oil     polyethylene glycol (MIRALAX / GLYCOLAX) 17 g packet Take 17 g by mouth as needed (as directed).     sennosides-docusate sodium (SENOKOT-S) 8.6-50 MG tablet Take 1 tablet by mouth as needed for constipation.     spironolactone (ALDACTONE) 50 MG tablet TAKE 2 TABLETS BY MOUTH EVERY MORNING AND 1 TABLET EVERY EVENING 270 tablet 1   triamcinolone cream (KENALOG) 0.1 % Apply 1 application topically daily as needed (for dry skin).   1   vitamin C (ASCORBIC ACID) 500 MG tablet Take 500 mg by mouth 2 (two) times daily.     No current facility-administered medications for this visit.    Allergies:  Hydrolyzed silk, Ciprofloxacin, Gluten meal, and Metoprolol   Social History:  The patient  reports that she has never smoked. She has never used smokeless tobacco. She reports that she does not currently use alcohol. She reports that she does not use drugs.   Family History:  The patient's family history includes Arthritis (age of onset: 41) in her sister; Cancer in her maternal grandmother and paternal uncle; Colon cancer (age of onset: 54) in her father; Diabetes (age of onset: 60) in her brother; Emphysema in her mother; Epilepsy (age of onset: 101) in her father; Hyperlipidemia in her father; Hypertension (age of onset: 92) in her father; Leukemia in her father; Other in her paternal grandmother; Skin cancer in her daughter; Stroke in her paternal grandfather; Uterine cancer in her mother.   ROS:  Please see the history of present illness.   Otherwise, review of systems is positive for none.   All other systems are reviewed and negative.   PHYSICAL EXAM: VS:  BP 124/72   Pulse 74   Ht '5\' 9"'$  (1.753 m)   Wt 247 lb 6.4 oz (112.2 kg)   SpO2 96%   BMI 36.53 kg/m  , BMI Body mass index is 36.53 kg/m. GEN: Well nourished, well developed, in no acute distress  HEENT: normal  Neck: no JVD, carotid bruits, or masses Cardiac: RRR; no  murmurs, rubs, or gallops,no edema  Respiratory:  clear to auscultation bilaterally, normal work of breathing GI: soft, nontender, nondistended, + BS MS: no deformity or atrophy  Skin: warm and dry Neuro:  Strength and sensation are intact Psych: euthymic mood, full affect  EKG:  EKG is not ordered today. Personal review of the ekg ordered 02/10/21 shows sinus rhythm  Recent Labs: 11/27/2020: ALT 27; Hemoglobin 13.8; Platelets 183.0; TSH 2.54 02/10/2021: BUN 15; Creatinine, Ser 0.86; Potassium 4.5; Sodium 136    Lipid Panel     Component Value Date/Time   CHOL 131 11/27/2020 0924   TRIG 72.0 11/27/2020 0924   HDL 46.20 11/27/2020 0924   CHOLHDL 3 11/27/2020 0924   VLDL 14.4 11/27/2020 0924   LDLCALC 71 11/27/2020 0924     Wt Readings from Last 3 Encounters:  08/20/21 247 lb 6.4 oz (112.2 kg)  06/30/21 240 lb 2 oz (108.9 kg)  05/18/21 238 lb 14.4 oz (108.4 kg)      Other studies Reviewed: Additional studies/ records that were reviewed today include: TTE 08/25/16  Review of the above records today demonstrates:  - Left ventricle: The cavity size was normal. Wall thickness was   increased in a pattern of mild LVH. Doppler parameters are   consistent with abnormal left ventricular relaxation (grade 1   diastolic dysfunction). - Left atrium:  The atrium was normal in size.   Myoview 08/25/17 Nuclear stress EF: 66%. There was no ST segment deviation noted during stress. The study is normal. This is a low risk study. The left ventricular ejection fraction is hyperdynamic (>65%).   Breast attenuation no ischemia or infarct EF 66%  ASSESSMENT AND PLAN:  1.  Paroxysmal atrial fibrillation/typical atrial flutter: Currently on Eliquis and diltiazem.  CHA2DS2-VASc of 3.  Status post ablation 08/09/2019.  Remains in sinus rhythm.  No changes at this time.  2.  Obstructive sleep apnea: Found to be severe.  Has a hypoglossal nerve stimulator.  Comfortable with control.  No  changes.  3.  Secondary hypercoagulable state: Currently on Eliquis for atrial fibrillation as above   Current medicines are  reviewed at length with the patient today.   The patient does not have concerns regarding her medicines.  The following changes were made today: none  Labs/ tests ordered today include:  No orders of the defined types were placed in this encounter.      Disposition: Follow-up 12 months  Signed, Keyonta Madrid Meredith Leeds, MD  08/20/2021 9:39 AM     [facility] [address] [place] [address] [phone] (office) [phone] (fax)

## 2021-08-24 ENCOUNTER — Other Ambulatory Visit: Payer: Self-pay | Admitting: Cardiology

## 2021-08-27 ENCOUNTER — Telehealth: Payer: Self-pay

## 2021-08-27 NOTE — Telephone Encounter (Signed)
Called pt and left detailed message confirming SCP visit for 6/21 @ 0845.

## 2021-09-01 ENCOUNTER — Inpatient Hospital Stay: Payer: PPO | Admitting: Adult Health

## 2021-09-01 ENCOUNTER — Inpatient Hospital Stay: Payer: PPO | Attending: Adult Health | Admitting: Adult Health

## 2021-09-01 DIAGNOSIS — C50412 Malignant neoplasm of upper-outer quadrant of left female breast: Secondary | ICD-10-CM | POA: Diagnosis not present

## 2021-09-01 DIAGNOSIS — M85839 Other specified disorders of bone density and structure, unspecified forearm: Secondary | ICD-10-CM | POA: Diagnosis not present

## 2021-09-01 DIAGNOSIS — Z923 Personal history of irradiation: Secondary | ICD-10-CM | POA: Diagnosis not present

## 2021-09-01 DIAGNOSIS — Z170 Estrogen receptor positive status [ER+]: Secondary | ICD-10-CM

## 2021-09-01 DIAGNOSIS — Z79811 Long term (current) use of aromatase inhibitors: Secondary | ICD-10-CM | POA: Insufficient documentation

## 2021-09-08 NOTE — Progress Notes (Signed)
SURVIVORSHIP VIRTUAL VISIT:  I connected with [name] on 09/08/21 at  9:45 AM EDT by telephone and verified that I am speaking with the correct person using two identifiers.  I discussed the limitations, risks, security and privacy concerns of performing an evaluation and management service by telephone and the availability of in person appointments. I also discussed with the patient that there may be a patient responsible charge related to this service. The patient expressed understanding and agreed to proceed.   Patient location: home Provider location: [facility] office.  BRIEF ONCOLOGIC HISTORY:  Oncology History  Malignant neoplasm of upper-outer quadrant of left breast in female, estrogen receptor positive ([place])  06/02/2019 Genetic Testing   Negative. Genes Tested include: APC*, ATM*, AXIN2, BAP1, BARD1, BMPR1A, BRCA1, BRCA2, BRIP1, CDH1, CDK4, CDKN2A (p14ARF), CDKN2A (p16INK4a), CHEK2, CTNNA1, DICER1*, EPCAM*, GREM1*, HOXB13, KIT, MEN1*, MITF*, MLH1*, MSH2*, MSH3*, MSH6*, MUTYH, NBN, NF1*, NTHL1, PALB2, PDGFRA, PMS2*, POLD1*, POLE, POT1, PTEN*, RAD50, RAD51C, RAD51D, RB1*, RNF43, SDHA*, SDHB, SDHC*, SDHD, SMAD4, SMARCA4, STK11, TP53, TSC1*, TSC2, VHL.   12/29/2020 Initial Diagnosis   Screening mammogram: 2 possible areas of distortion in the left breast 1o clock 11 O Clock. Diagnostic mammogram and [place]: large suspicious area of distortion within the superior left breast.  Both biopsies: Grade 2 IDC ER 95%, PR 95%, Ki-67 5%, HER2 equivocal by IHC, FISH negative ratio 1.08 and copy #1.95   02/16/2021 Surgery   02/16/2021: Left lumpectomy: IDC with DCIS 4.6 cm, grade 2, margins negative, 1/2 lymph nodes positive, ER 95%, PR 100%, HER2 equivocal by IHC FISH ratio 1.18: Negative, Ki-67 2%   02/16/2021 Miscellaneous   Mammaprint: low risk indicating no chemotherapy benefit   04/12/2021 - 05/21/2021 Radiation Therapy   Site Technique Total Dose (Gy) Dose per Fx (Gy) Completed Fx Beam Energies   Breast, Left: Breast_L 3D 50/50 2 25/25 10X  Breast, Left: Breast_L_SCV_PAB 3D 50/50 2 25/25 10X  Breast, Left: Breast_L_Bst 3D 10/10 2 5/5 6X, 10X     06/2021 -  Anti-estrogen oral therapy   Letrozole daily     INTERVAL HISTORY:  [name] to review her survivorship care plan detailing her treatment course for breast cancer, as well as monitoring long-term side effects of that treatment, education regarding health maintenance, screening, and overall wellness and health promotion.     Overall, [name] reports feeling moderately well.  She tolerates the Letrozole with some hot flashes, worse at night, but overall these are tolerable.   REVIEW OF SYSTEMS:  Review of Systems  Constitutional:  Negative for appetite change, chills, fatigue, fever and unexpected weight change.  HENT:   Negative for hearing loss, lump/mass and trouble swallowing.   Eyes:  Negative for eye problems and icterus.  Respiratory:  Negative for chest tightness, cough and shortness of breath.   Cardiovascular:  Negative for chest pain, leg swelling and palpitations.  Gastrointestinal:  Negative for abdominal distention, abdominal pain, constipation, diarrhea, nausea and vomiting.  Endocrine: Positive for hot flashes.  Genitourinary:  Negative for difficulty urinating.   Musculoskeletal:  Negative for arthralgias.  Skin:  Negative for itching and rash.  Neurological:  Negative for dizziness, extremity weakness, headaches and numbness.  Hematological:  Negative for adenopathy. Does not bruise/bleed easily.  Psychiatric/Behavioral:  Negative for depression. The patient is not nervous/anxious.    Breast: Denies any new nodularity, masses, tenderness, nipple changes, or nipple discharge.      ONCOLOGY TREATMENT TEAM:  1. Surgeon:  Dr. Brantley Stage at [facility]  2. Medical Oncologist: Dr. Lindi Adie  3. Radiation Oncologist: Dr. Isidore Moos    PAST MEDICAL/SURGICAL HISTORY:  Past Medical History:  Diagnosis  Date   Chronic edema    Clotting disorder (HCC)    Constipation    Difficult intubation    pt states that her neck needs to be in a neutral position,  scoliosisand herniated dics in neck   Family history of colon cancer    Family history of uterine cancer    Gallstones    GERD (gastroesophageal reflux disease)    protonix, hx h. pylori   Herniated disc, cervical    History of hiatal hernia    History of sebaceous adenoma    History of uterine cancer 04/22/2014   sees Dr. Ronita Hipps; s/p complete hysterectomy   Hx of colonic polyp    Lumbar and sacral osteoarthritis 12/10/2013   Melanoma ([place])    sees Dr. Renda Rolls in dermatology right upper arm   Obesity    OSA on CPAP    uses CPAP   Paroxysmal atrial fibrillation (HCC)    Peripheral vascular disease (HCC)    PONV (postoperative nausea and vomiting)    Recurrent UTI    S/P hip replacement    Scoliosis    Sleep apnea    Thyroid nodule    Urinary incontinence    Uterine cancer ([place])    Stage 1   Venous insufficiency    s/p ablation   Past Surgical History:  Procedure Laterality Date   ABDOMINAL HYSTERECTOMY  09/10/2013   ATRIAL FIBRILLATION ABLATION N/A 08/09/2019   Procedure: ATRIAL FIBRILLATION ABLATION;  Surgeon: Constance Haw, MD;  Location: [facility];  Service: Cardiovascular;  Laterality: N/A;   BREAST LUMPECTOMY WITH RADIOACTIVE SEED AND SENTINEL LYMPH NODE BIOPSY Left 02/16/2021   Procedure: LEFT BREAST LUMPECTOMY WITH RADIOACTIVE SEED X2 AND SENTINEL LYMPH NODE BIOPSY;  Surgeon: Erroll Luna, MD;  Location: [facility];  Service: General;  Laterality: Left;   BUNIONECTOMY  10/1976   CHOLECYSTECTOMY N/A 03/12/2019   Procedure: XI ROBOTIC ASSISTED CHOLECYSTECTOMY;  Surgeon: Clovis Riley, MD;  Location: [facility];  Service: General;  Laterality: N/A;   COLONOSCOPY     COLONOSCOPY WITH PROPOFOL N/A 01/17/2020   Procedure: COLONOSCOPY WITH PROPOFOL;  Surgeon: Jackquline Denmark, MD;  Location: [facility];  Service: Endoscopy;  Laterality: N/A;   DRUG INDUCED ENDOSCOPY N/A 03/04/2020   Procedure: DRUG INDUCED ENDOSCOPY;  Surgeon: Melida Quitter, MD;  Location: [facility];  Service: ENT;  Laterality: N/A;   FRACTURE SURGERY  09/1964   floor of orbit right side   HIATAL HERNIA REPAIR     IMPLANTATION OF HYPOGLOSSAL NERVE STIMULATOR Right 04/15/2020   Procedure: IMPLANTATION OF HYPOGLOSSAL NERVE STIMULATOR;  Surgeon: Melida Quitter, MD;  Location: [facility];  Service: ENT;  Laterality: Right;   JOINT REPLACEMENT     bilateral hip replacements   MELANOMA EXCISION  12/2011   POLYPECTOMY  01/17/2020   Procedure: POLYPECTOMY;  Surgeon: Jackquline Denmark, MD;  Location: [facility];  Service: Endoscopy;;   TOTAL HIP ARTHROPLASTY Left 06/17/2014   Procedure: LEFT TOTAL HIP ARTHROPLASTY ANTERIOR APPROACH;  Surgeon: Mcarthur Rossetti, MD;  Location: [facility];  Service: Orthopedics;  Laterality: Left;   TOTAL HIP ARTHROPLASTY Right 09/02/2014   Procedure: RIGHT TOTAL HIP ARTHROPLASTY ANTERIOR APPROACH;  Surgeon: Mcarthur Rossetti, MD;  Location: [facility];  Service: Orthopedics;  Laterality: Right;   VEIN SURGERY  05/2011  venous ablation      ALLERGIES:  Allergies  Allergen Reactions   Hydrolyzed Silk Hives and Other (See Comments)    Silk tape-blisters   Ciprofloxacin Other (See Comments)    Reactions with antiarrythmic Reactions with antiarrythmic   Gluten Meal Other (See Comments)    reflux   Metoprolol     dizzy     CURRENT MEDICATIONS:  Outpatient Encounter Medications as of 09/01/2021  Medication Sig   acetaminophen (TYLENOL) 500 MG tablet Take 2 tablets (1,000 mg total) by mouth every 6 (six) hours as needed.   Cranberry 500 MG TABS Take 1,000 mg by mouth 2 (two) times daily.   Cyanocobalamin (VITAMIN B 12) 500 MCG TABS Take 1 tablet by mouth daily.   diltiazem (CARDIZEM) 60 MG tablet Take 60 mg daily if needed for palpitations   diltiazem  (TIAZAC) 240 MG 24 hr capsule TAKE 1 CAPSULE BY MOUTH EVERY DAY   ELIQUIS 5 MG TABS tablet TAKE 1 TABLET(5 MG) BY MOUTH TWICE DAILY   ibuprofen (ADVIL) 200 MG tablet Take 400 mg by mouth every 6 (six) hours as needed (pain).   letrozole (FEMARA) 2.5 MG tablet Take 1 tablet (2.5 mg total) by mouth daily.   MYRBETRIQ 50 MG TB24 tablet Take 50 mg by mouth daily.   nitrofurantoin, macrocrystal-monohydrate, (MACROBID) 100 MG capsule Take 100 mg by mouth daily.   omeprazole (PRILOSEC) 20 MG capsule Take 20 mg by mouth daily as needed (heartburn).   OVER THE COUNTER MEDICATION Take 20 mg by mouth daily as needed (arthritis). CBD oil   polyethylene glycol (MIRALAX / GLYCOLAX) 17 g packet Take 17 g by mouth as needed (as directed).   sennosides-docusate sodium (SENOKOT-S) 8.6-50 MG tablet Take 1 tablet by mouth as needed for constipation.   spironolactone (ALDACTONE) 50 MG tablet TAKE 2 TABLETS BY MOUTH EVERY MORNING AND 1 TABLET EVERY EVENING   triamcinolone cream (KENALOG) 0.1 % Apply 1 application topically daily as needed (for dry skin).    vitamin C (ASCORBIC ACID) 500 MG tablet Take 500 mg by mouth 2 (two) times daily.   No facility-administered encounter medications on file as of 09/01/2021.     ONCOLOGIC FAMILY HISTORY:  Family History  Problem Relation Age of Onset   Uterine cancer Mother    Emphysema Mother    Diabetes Brother 52   Hypertension Father 49   Hyperlipidemia Father    Epilepsy Father 43   Colon cancer Father 30   Leukemia Father    Arthritis Sister 81   Cancer Maternal Grandmother        unk type possibly breast or skin   Other Paternal Grandmother        influenza    Stroke Paternal Grandfather    Cancer Paternal Uncle        unk type   Skin cancer Daughter        SCC on back   Esophageal cancer Neg Hx    Rectal cancer Neg Hx    Stomach cancer Neg Hx     SOCIAL HISTORY:  Social History   Socioeconomic History   Marital status: Divorced    Spouse name:  Not on file   Number of children: 2   Years of education: JD   Highest education level: Not on file  Occupational History   Occupation: lawyer/RETIRED  Tobacco Use   Smoking status: Never   Smokeless tobacco: Never  Vaping Use   Vaping Use: Never used  Substance  and Sexual Activity   Alcohol use: Not Currently    Comment: rarely at [facility]    Drug use: No   Sexual activity: Not Currently    Birth control/protection: Surgical  Other Topics Concern   Not on file  Social History Narrative   Work or School: retired Forensic psychologist      Home Situation: lives alone - takes care of twin grandchildren 7 months in 05/2015      Spiritual Beliefs:       Lifestyle: active, healthy diet      Social Determinants of Health   Financial Resource Strain: Not on file  Food Insecurity: Not on file  Transportation Needs: Not on file  Physical Activity: Not on file  Stress: Not on file  Social Connections: Not on file  Intimate Partner Violence: Not on file     OBSERVATIONS/OBJECTIVE:  Patient sounds well.  She is in no apparent distress, mood and behavior are normal.    LABORATORY DATA:  None for this visit.  DIAGNOSTIC IMAGING:  None for this visit.      ASSESSMENT AND PLAN:  Ms.. Riese is a pleasant 71 y.o. female with Stage IB left breast invasive ductal carcinoma, ER+/PR+/HER2-, diagnosed in 12/2020, treated with lumpectomy, adjuvant radiation therapy, and anti-estrogen therapy with Letrozole beginning in 06/2021.  She presents to the Survivorship Clinic for our initial meeting and routine follow-up post-completion of treatment for breast cancer.    1. Stage IB left breast cancer:  Ms. Ladd is continuing to recover from definitive treatment for breast cancer. She will follow-up with her medical oncologist, Dr. Lindi Adie in 6 months with history and physical exam per surveillance protocol.  She will continue her anti-estrogen therapy with Letrozole. Thus far, she is tolerating the  Letrozole well, with minimal side effects. She was instructed to make Dr. Lindi Adie or myself aware if she begins to experience any worsening side effects of the medication and I could see her back in clinic to help manage those side effects, as needed. Her mammogram is due 11/2021; orders placed today. Today, a comprehensive survivorship care plan and treatment summary was reviewed with the patient today detailing her breast cancer diagnosis, treatment course, potential late/long-term effects of treatment, appropriate follow-up care with recommendations for the future, and patient education resources.  A copy of this summary, along with a letter will be sent to the patient's primary care provider via mail/fax/In Basket message after today's visit.    2. Bone health:  Given Ms. Motta's age/history of breast cancer and her current treatment regimen including anti-estrogen therapy with Letrozole, she is at risk for bone demineralization.  Her last DEXA scan was 11/2020, which showed mild osteopenia with a T score of -1.1 in the forearm.  In the meantime, she was encouraged to increase her consumption of foods rich in calcium, as well as increase her weight-bearing activities.  She was given education on specific activities to promote bone health.  3. Cancer screening:  Due to Ms. Maneri's history and her age, she should receive screening for skin cancers, colon cancer, and gynecologic cancers.  The information and recommendations are listed on the patient's comprehensive care plan/treatment summary and were reviewed in detail with the patient.    4. Health maintenance and wellness promotion: Ms. Mandala was encouraged to consume 5-7 servings of fruits and vegetables per day. We reviewed the "Nutrition Rainbow" handout.  She was also encouraged to engage in moderate to vigorous exercise for 30 minutes per day most  days of the week. We discussed the [employer] fitness program, which is designed for cancer survivors  to help them become more physically fit after cancer treatments.  She was instructed to limit her alcohol consumption and continue to abstain from tobacco use.     5. Support services/counseling: It is not uncommon for this period of the patient's cancer care trajectory to be one of many emotions and stressors. She was given information regarding our available services and encouraged to contact me with any questions or for help enrolling in any of our support group/programs.    Follow up instructions:    -Return to cancer center in 6 months for f/u with Dr. Lindi Adie  -Mammogram due in 11/2021 -Follow up with surgery 1 year -She is welcome to return back to the Survivorship Clinic at any time; no additional follow-up needed at this time.  -Consider referral back to survivorship as a long-term survivor for continued surveillance  The patient was provided an opportunity to ask questions and all were answered. The patient agreed with the plan and demonstrated an understanding of the instructions.    I provided 20 minutes of face-to-face video visit time during this encounter, and > 50% was spent counseling as documented under my assessment & plan.   Wilber Bihari, NP 09/08/21 7:39 AM Medical Oncology and Hematology [facility] [place], [place] [address] Tel. [phone]    Fax. [phone]  *Total Encounter Time as defined by the Centers for Medicare and Medicaid Services includes, in addition to the face-to-face time of a patient visit (documented in the note above) non-face-to-face time: obtaining and reviewing outside history, ordering and reviewing medications, tests or procedures, care coordination (communications with other health care professionals or caregivers) and documentation in the medical record.

## 2021-09-10 ENCOUNTER — Telehealth: Payer: Self-pay | Admitting: Hematology and Oncology

## 2021-09-10 NOTE — Telephone Encounter (Signed)
.  Called patient to schedule appointment per 6/29 inbasket, patient is aware of date and time.   

## 2021-09-18 ENCOUNTER — Other Ambulatory Visit: Payer: Self-pay | Admitting: Cardiology

## 2021-09-18 DIAGNOSIS — I4891 Unspecified atrial fibrillation: Secondary | ICD-10-CM

## 2021-09-19 ENCOUNTER — Other Ambulatory Visit: Payer: Self-pay | Admitting: Cardiology

## 2021-09-19 DIAGNOSIS — I4891 Unspecified atrial fibrillation: Secondary | ICD-10-CM

## 2021-09-20 NOTE — Telephone Encounter (Signed)
Prescription refill request for Eliquis received. Indication: Afib  Last office visit: 08/20/21 ([place])  Scr: 0.86 (02/10/21)  Age: 71 Weight: 112.2kg  Appropriate dose and refill sent to requested pharmacy.

## 2021-11-08 DIAGNOSIS — R8271 Bacteriuria: Secondary | ICD-10-CM | POA: Diagnosis not present

## 2021-11-08 DIAGNOSIS — N3020 Other chronic cystitis without hematuria: Secondary | ICD-10-CM | POA: Diagnosis not present

## 2021-11-08 DIAGNOSIS — N3281 Overactive bladder: Secondary | ICD-10-CM | POA: Diagnosis not present

## 2021-11-12 DIAGNOSIS — Z170 Estrogen receptor positive status [ER+]: Secondary | ICD-10-CM | POA: Diagnosis not present

## 2021-11-12 DIAGNOSIS — C50412 Malignant neoplasm of upper-outer quadrant of left female breast: Secondary | ICD-10-CM | POA: Diagnosis not present

## 2021-12-02 ENCOUNTER — Telehealth (INDEPENDENT_AMBULATORY_CARE_PROVIDER_SITE_OTHER): Payer: PPO | Admitting: Family Medicine

## 2021-12-02 VITALS — Ht 69.0 in | Wt 230.0 lb

## 2021-12-02 DIAGNOSIS — U071 COVID-19: Secondary | ICD-10-CM | POA: Diagnosis not present

## 2021-12-02 MED ORDER — MOLNUPIRAVIR EUA 200MG CAPSULE: 4.0000 | ORAL_CAPSULE | Freq: Two times a day (BID) | ORAL | 0 refills | Status: AC

## 2021-12-02 NOTE — Progress Notes (Signed)
Virtual Visit via Video Note  I connected with [name]  on 12/02/21 at  3:00 PM EDT by a video enabled telemedicine application and verified that I am speaking with the correct person using two identifiers.  Location patient: [place] Location provider:work or home office Persons participating in the virtual visit: patient, provider  I discussed the limitations and requested verbal permission for telemedicine visit. The patient expressed understanding and agreed to proceed.   HPI:  Acute telemedicine visit for Covid19: -Onset: 2 days ago, tested positive for covid -Symptoms include: raspy throat, pnd, body aches, nasal congestion, cough -Denies: fever, CP, SOB, NVD -Pertinent past medical history: see below -Pertinent medication allergies: Allergies  Allergen Reactions   Hydrolyzed Silk Hives and Other (See Comments)    Silk tape-blisters   Ciprofloxacin Other (See Comments)    Reactions with antiarrythmic Reactions with antiarrythmic   Gluten Meal Other (See Comments)    reflux   Metoprolol     dizzy  -COVID-19 vaccine status: has had multiple doses and booster - she thinks 4 total doses with most recent in 11/2020 Immunization History  Administered Date(s) Administered   Fluad Quad(high Dose 65+) 03/13/2019, 11/26/2020   Influenza, High Dose Seasonal PF 03/26/2014, 12/29/2016, 01/01/2018   Influenza,inj,Quad PF,6+ Mos 01/19/2020   Influenza-Unspecified 03/26/2014, 03/15/2015, 03/12/2016   Moderna SARS-COV2 Booster Vaccination 01/27/2020   Moderna Sars-Covid-2 Vaccination 05/14/2019, 06/11/2019, 12/08/2020   Pneumococcal Conjugate-13 04/11/2016   Pneumococcal Polysaccharide-23 01/01/2018, 03/13/2019   Td 10/12/2013     ROS: See pertinent positives and negatives per HPI.  Past Medical History:  Diagnosis Date   Chronic edema    Clotting disorder (HCC)    Constipation    Difficult intubation    pt states that her neck needs to be in a neutral position,  scoliosisand  herniated dics in neck   Family history of colon cancer    Family history of uterine cancer    Gallstones    GERD (gastroesophageal reflux disease)    protonix, hx h. pylori   Herniated disc, cervical    History of hiatal hernia    History of sebaceous adenoma    History of uterine cancer 04/22/2014   sees Dr. 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 Renda Rolls in dermatology right upper arm   Obesity    OSA on CPAP    uses CPAP   Paroxysmal atrial fibrillation (HCC)    Peripheral vascular disease (HCC)    PONV (postoperative nausea and vomiting)    Recurrent UTI    S/P hip replacement    Scoliosis    Sleep apnea    Thyroid nodule    Urinary incontinence    Uterine cancer ([place])    Stage 1   Venous insufficiency    s/p ablation    Past Surgical History:  Procedure Laterality Date   ABDOMINAL HYSTERECTOMY  09/10/2013   ATRIAL FIBRILLATION ABLATION N/A 08/09/2019   Procedure: ATRIAL FIBRILLATION ABLATION;  Surgeon: Constance Haw, MD;  Location: [facility];  Service: Cardiovascular;  Laterality: N/A;   BREAST LUMPECTOMY WITH RADIOACTIVE SEED AND SENTINEL LYMPH NODE BIOPSY Left 02/16/2021   Procedure: LEFT BREAST LUMPECTOMY WITH RADIOACTIVE SEED X2 AND SENTINEL LYMPH NODE BIOPSY;  Surgeon: Erroll Luna, MD;  Location: [facility];  Service: General;  Laterality: Left;   BUNIONECTOMY  10/1976   CHOLECYSTECTOMY N/A 03/12/2019  Procedure: XI ROBOTIC ASSISTED CHOLECYSTECTOMY;  Surgeon: Clovis Riley, MD;  Location: [facility];  Service: General;  Laterality: N/A;   COLONOSCOPY     COLONOSCOPY WITH PROPOFOL N/A 01/17/2020   Procedure: COLONOSCOPY WITH PROPOFOL;  Surgeon: Jackquline Denmark, MD;  Location: [facility];  Service: Endoscopy;  Laterality: N/A;   DRUG INDUCED ENDOSCOPY N/A 03/04/2020   Procedure: DRUG INDUCED ENDOSCOPY;  Surgeon: Melida Quitter, MD;   Location: [facility];  Service: ENT;  Laterality: N/A;   FRACTURE SURGERY  09/1964   floor of orbit right side   HIATAL HERNIA REPAIR     IMPLANTATION OF HYPOGLOSSAL NERVE STIMULATOR Right 04/15/2020   Procedure: IMPLANTATION OF HYPOGLOSSAL NERVE STIMULATOR;  Surgeon: Melida Quitter, MD;  Location: [facility];  Service: ENT;  Laterality: Right;   JOINT REPLACEMENT     bilateral hip replacements   MELANOMA EXCISION  12/2011   POLYPECTOMY  01/17/2020   Procedure: POLYPECTOMY;  Surgeon: Jackquline Denmark, MD;  [facility];  Service: Endoscopy;;   TOTAL HIP ARTHROPLASTY Left 06/17/2014   Procedure: LEFT TOTAL HIP ARTHROPLASTY ANTERIOR APPROACH;  Surgeon: Mcarthur Rossetti, MD;  Location: [facility];  Service: Orthopedics;  Laterality: Left;   TOTAL HIP ARTHROPLASTY Right 09/02/2014   Procedure: RIGHT TOTAL HIP ARTHROPLASTY ANTERIOR APPROACH;  Surgeon: Mcarthur Rossetti, MD;  Location: [facility];  Service: Orthopedics;  Laterality: Right;   VEIN SURGERY  05/2011   venous ablation      Current Outpatient Medications:    acetaminophen (TYLENOL) 500 MG tablet, Take 2 tablets (1,000 mg total) by mouth every 6 (six) hours as needed., Disp: , Rfl: 0   Cranberry 500 MG TABS, Take 1,000 mg by mouth 2 (two) times daily., Disp: , Rfl:    Cyanocobalamin (VITAMIN B 12) 500 MCG TABS, Take 1 tablet by mouth daily., Disp: , Rfl:    diltiazem (CARDIZEM) 60 MG tablet, Take 60 mg daily if needed for palpitations, Disp: 30 tablet, Rfl: 9   diltiazem (TIAZAC) 240 MG 24 hr capsule, TAKE 1 CAPSULE BY MOUTH EVERY DAY, Disp: 90 capsule, Rfl: 3   ELIQUIS 5 MG TABS tablet, TAKE 1 TABLET(5 MG) BY MOUTH TWICE DAILY, Disp: 180 tablet, Rfl: 1   ibuprofen (ADVIL) 200 MG tablet, Take 400 mg by mouth every 6 (six) hours as needed (pain)., Disp: , Rfl:    letrozole (FEMARA) 2.5 MG tablet, Take 1 tablet (2.5 mg total) by mouth daily., Disp: 90 tablet, Rfl: 3   methenamine (HIPREX) 1 g tablet, Take  1 g by mouth 2 (two) times daily., Disp: , Rfl:    OVER THE COUNTER MEDICATION, Take 20 mg by mouth daily as needed (arthritis). CBD oil, Disp: , Rfl:    polyethylene glycol (MIRALAX / GLYCOLAX) 17 g packet, Take 17 g by mouth as needed (as directed)., Disp: , Rfl:    sennosides-docusate sodium (SENOKOT-S) 8.6-50 MG tablet, Take 1 tablet by mouth as needed for constipation., Disp: , Rfl:    spironolactone (ALDACTONE) 50 MG tablet, TAKE 2 TABLETS BY MOUTH EVERY MORNING AND 1 TABLET EVERY EVENING, Disp: 270 tablet, Rfl: 3   triamcinolone cream (KENALOG) 0.1 %, Apply 1 application topically daily as needed (for dry skin). , Disp: , Rfl: 1   vitamin C (ASCORBIC ACID) 500 MG tablet, Take 500 mg by mouth 2 (two) times daily., Disp: , Rfl:    MYRBETRIQ 50 MG TB24 tablet, Take 50 mg by mouth daily. (Patient not taking: Reported on  12/02/2021), Disp: , Rfl:    omeprazole (PRILOSEC) 20 MG capsule, Take 20 mg by mouth daily as needed (heartburn). (Patient not taking: Reported on 12/02/2021), Disp: , Rfl:   EXAM:  VITALS per patient if applicable: denies fever  GENERAL: alert, oriented, appears well and in no acute distress  HEENT: atraumatic, conjunttiva clear, no obvious abnormalities on inspection of external nose and ears  NECK: normal movements of the head and neck  LUNGS: on inspection no signs of respiratory distress, breathing rate appears normal, no obvious gross SOB, gasping or wheezing  CV: no obvious cyanosis  MS: moves all visible extremities without noticeable abnormality  PSYCH/NEURO: pleasant and cooperative, no obvious depression or anxiety, speech and thought processing grossly intact  ASSESSMENT AND PLAN:  Discussed the following assessment and plan:  COVID-19   Discussed treatment options, side effect and risk of drug interactions, ideal treatment window, potential complications, isolation and precautions for COVID-19.  After lengthy discussion, the patient opted for treatment  with Legevrio due to being higher risk for complications of covid or severe disease and other factors. Discussed  preliminary limited knowledge of risks/interactions/side effects per EUA document vs possible benefits and precautions. This information was shared with patient during the visit and also was provided in patient instructions. Also, advised that patient discuss risks/interactions and use with pharmacist/treatment team as well. Other symptomatic care measures summarized in patient instructions. Advise to seek prompt virtual visit or in person care if worsening, new symptoms arise, or if is not improving with treatment as expected per our conversation of expected course.    I discussed the assessment and treatment plan with the patient. The patient was provided an opportunity to ask questions and all were answered. The patient agreed with the plan and demonstrated an understanding of the instructions.     Lucretia Kern, DO

## 2021-12-02 NOTE — Patient Instructions (Addendum)
HOME CARE TIPS:   -I sent the medication(s) we discussed to your pharmacy: Meds ordered this encounter  Medications   molnupiravir EUA (LAGEVRIO) 200 mg CAPS capsule    Sig: Take 4 capsules (800 mg total) by mouth 2 (two) times daily for 5 days.    Dispense:  40 capsule    Refill:  0     -I sent in the [facility] treatment or referral you requested per our discussion. Please see the information provided below and discuss further with the pharmacist/treatment team.   -there is a chance of rebound illness with covid after improving. This can happen whether or not you take an antiviral treatment. If you become sick again with covid after getting better, please schedule a follow up virtual visit and isolate again.  -can use tylenol if needed for fevers, aches and pains per instructions  -nasal saline sinus rinses twice daily  -stay hydrated, drink plenty of fluids and eat small healthy meals - avoid dairy  -follow up with your doctor in 2-3 days unless improving and feeling better  -stay home while sick, except to seek medical care. If you have COVID19, you will likely be contagious for 7-10 days. Flu or Influenza is likely contagious for about 7 days. Other respiratory viral infections remain contagious for 5-10+ days depending on the virus and many other factors. Wear a good mask that fits snugly (such as N95 or KN95) if around others to reduce the risk of transmission.  It was nice to meet you today, and I really hope you are feeling better soon. I help [facility] out with telemedicine visits on Tuesdays and Thursdays and am happy to help if you need a follow up virtual visit on those days. Otherwise, if you have any concerns or questions following this visit please schedule a follow up visit with your Primary Care doctor or seek care at a local urgent care clinic to avoid delays in care.    Seek in person care or schedule a follow up video visit promptly if your symptoms worsen, new  concerns arise or you are not improving with treatment. Call 911 and/or seek emergency care if your symptoms are severe or life threatening.  PLEASE SEE THE FOLLOWING LINK FOR THE MOST UPDATED INFORMATION ABOUT LAGEVRIO:  [email or web address]      Fact Sheet for Patients And Caregivers Emergency Use Authorization (EUA) Of LAGEVRIOT (molnupiravir) capsules For Coronavirus Disease 2019 (COVID-19)  What is the most important information I should know about LAGEVRIO? LAGEVRIO may cause serious side effects, including: ? LAGEVRIO may cause harm to your unborn baby. It is not known if LAGEVRIO will harm your baby if you take LAGEVRIO during pregnancy. o LAGEVRIO is not recommended for use in pregnancy. o LAGEVRIO has not been studied in pregnancy. LAGEVRIO was studied in pregnant animals only. When LAGEVRIO was given to pregnant animals, LAGEVRIO caused harm to their unborn babies. o You and your healthcare provider may decide that you should take LAGEVRIO during pregnancy if there are no other COVID-19 treatment options approved or authorized by the FDA that are accessible or clinically appropriate for you. o If you and your healthcare provider decide that you should take LAGEVRIO during pregnancy, you and your healthcare provider should discuss the known and potential benefits and the potential risks of taking LAGEVRIO during pregnancy. For individuals who are able to become pregnant: ? You should use a reliable method of birth control (contraception) consistently and correctly during treatment with LAGEVRIO and for  4 days after the last dose of LAGEVRIO. Talk to your healthcare provider about reliable birth control methods. ? Before starting treatment with [facility] your healthcare provider may do a pregnancy test to see if you are pregnant before starting treatment with LAGEVRIO. ? Tell your healthcare provider right away if you become pregnant or think you may be pregnant during  treatment with LAGEVRIO. Pregnancy Surveillance Program: ? There is a pregnancy surveillance program for individuals who take LAGEVRIO during pregnancy. The purpose of this program is to collect information about the health of you and your baby. Talk to your healthcare provider about how to take part in this program. ? If you take LAGEVRIO during pregnancy and you agree to participate in the pregnancy surveillance program and allow your healthcare provider to share your information with [place], then your healthcare provider will report your use of [place] during pregnancy to [place]. by calling [phone] or [email or web address]. For individuals who are sexually active with partners who are able to become pregnant: ? It is not known if LAGEVRIO can affect sperm. While the risk is regarded as low, animal studies to fully assess the potential for LAGEVRIO to affect the babies of males treated with LAGEVRIO have not been completed. A reliable method of birth control (contraception) should be used consistently and correctly during treatment with LAGEVRIO and for at least 3 months after the last dose. The risk to sperm beyond 3 months is not known. Studies to understand the risk to sperm beyond 3 months are ongoing. Talk to your healthcare provider about reliable birth control methods. Talk to your healthcare provider if you have questions or concerns about how LAGEVRIO may affect sperm. You are being given this fact sheet because your healthcare provider believes it is necessary to provide you with LAGEVRIO for the treatment of adults with mild-to-moderate coronavirus disease 2019 (COVID-19) with positive results of direct SARS-CoV-2 viral testing, and who are at high risk for progression to severe COVID-19 including hospitalization or death, and for whom other COVID-19 treatment options approved or authorized by the FDA are not accessible or clinically  appropriate. The U.S. Food and Drug Administration (FDA) has issued an Emergency Use Authorization (EUA) to make LAGEVRIO available during the COVID-19 pandemic (for more details about an EUA please see "What is an Emergency Use Authorization?" at the end of this document). LAGEVRIO is not an FDA-approved medicine in the [place]. Read this Fact Sheet for information about LAGEVRIO. Talk to your healthcare provider about your options if you have any questions. It is your choice to take LAGEVRIO.  What is COVID-19? COVID-19 is caused by a virus called a coronavirus. You can get COVID-19 through close contact with another person who has the virus. COVID-19 illnesses have ranged from very mild-to-severe, including illness resulting in death. While information so far suggests that most COVID-19 illness is mild, serious illness can happen and may cause some of your other medical conditions to become worse. Older people and people of all ages with severe, long lasting (chronic) medical conditions like heart disease, lung disease and diabetes, for example seem to be at higher risk of being hospitalized for COVID-19.  What is LAGEVRIO? LAGEVRIO is an investigational medicine used to treat mild-to-moderate COVID-19 in adults: ? with positive results of direct SARS-CoV-2 viral testing, and ? who are at high risk for progression to severe COVID-19 including hospitalization or death, and for whom other COVID-19 treatment options approved or authorized  by the FDA are not accessible or clinically appropriate. The FDA has authorized the emergency use of LAGEVRIO for the treatment of mild-tomoderate COVID-19 in adults under an EUA. For more information on EUA, see the "What is an Emergency Use Authorization (EUA)?" section at the end of this Fact Sheet. LAGEVRIO is not authorized: ? for use in people less than 61 years of age. ? for prevention of COVID-19. ? for people needing hospitalization for  COVID-19. ? for use for longer than 5 consecutive days.  What should I tell my healthcare provider before I take LAGEVRIO? Tell your healthcare provider if you: ? Have any allergies ? Are breastfeeding or plan to breastfeed ? Have any serious illnesses ? Are taking any medicines (prescription, over-the-counter, vitamins, or herbal products).  How do I take LAGEVRIO? ? Take LAGEVRIO exactly as your healthcare provider tells you to take it. ? Take 4 capsules of LAGEVRIO every 12 hours (for example, at 8 am and at 8 pm) ? Take LAGEVRIO for 5 days. It is important that you complete the full 5 days of treatment with LAGEVRIO. Do not stop taking LAGEVRIO before you complete the full 5 days of treatment, even if you feel better. ? Take LAGEVRIO with or without food. ? You should stay in isolation for as long as your healthcare provider tells you to. Talk to your healthcare provider if you are not sure about how to properly isolate while you have COVID-19. ? Swallow LAGEVRIO capsules whole. Do not open, break, or crush the capsules. If you cannot swallow capsules whole, tell your healthcare provider. ? What to do if you miss a dose: o If it has been less than 10 hours since the missed dose, take it as soon as you remember o If it has been more than 10 hours since the missed dose, skip the missed dose and take your dose at the next scheduled time. ? Do not double the dose of LAGEVRIO to make up for a missed dose.  What are the important possible side effects of LAGEVRIO? ? See, "What is the most important information I should know about LAGEVRIO?" ? Allergic Reactions. Allergic reactions can happen in people taking LAGEVRIO, even after only 1 dose. Stop taking LAGEVRIO and call your healthcare provider right away if you get any of the following symptoms of an allergic reaction: o hives o rapid heartbeat o trouble swallowing or breathing o swelling of the mouth, lips, or face o throat  tightness o hoarseness o skin rash The most common side effects of LAGEVRIO are: ? diarrhea ? nausea ? dizziness These are not all the possible side effects of LAGEVRIO. Not many people have taken LAGEVRIO. Serious and unexpected side effects may happen. This medicine is still being studied, so it is possible that all of the risks are not known at this time.  What other treatment choices are there?  Veklury (remdesivir) is FDA-approved as an intravenous (IV) infusion for the treatment of mildto-moderate [id number]-36 in certain adults and children. Talk with your doctor to see if Marijean Heath is appropriate for you. Like LAGEVRIO, FDA may also allow for the emergency use of other medicines to treat people with COVID-19. Go to LacrosseProperties.si for more information. It is your choice to be treated or not to be treated with LAGEVRIO. Should you decide not to take it, it will not change your standard medical care.  What if I am breastfeeding? Breastfeeding is not recommended during treatment with LAGEVRIO and for 4  days after the last dose of LAGEVRIO. If you are breastfeeding or plan to breastfeed, talk to your healthcare provider about your options and specific situation before taking LAGEVRIO.  How do I report side effects with LAGEVRIO? Contact your healthcare provider if you have any side effects that bother you or do not go away. Report side effects to FDA MedWatch at SmoothHits.hu or call 1-800-FDA-1088 (1- [phone]).  How should I store [place]? ? Store LAGEVRIO capsules at room temperature between 82F to 78F (20C to 25C). ? Keep LAGEVRIO and all medicines out of the reach of children and pets. How can I learn more about COVID-19? ? Ask your healthcare provider. ? Visit SeekRooms.co.uk ? Contact your local or state public health department. ? Call [place] at [phone] (toll free in the [place] ? Visit [email or web address].com  What Is an Emergency Use Authorization (EUA)? The [place] FDA has made [place] available under an emergency access mechanism called an Emergency Use Authorization (EUA) The EUA is supported by a Presenter, broadcasting Health and Human Service (HHS) declaration that circumstances exist to justify emergency use of drugs and biological products during the COVID-19 pandemic. LAGEVRIO for the treatment of mild-to-moderate COVID-19 in adults with positive results of direct SARS-CoV-2 viral testing, who are at high risk for progression to severe COVID-19, including hospitalization or death, and for whom alternative COVID-19 treatment options approved or authorized by FDA are not accessible or clinically appropriate, has not undergone the same type of review as an FDA-approved product. In issuing an EUA under the VUDTH-43 public health emergency, the FDA has determined, among other things, that based on the total amount of scientific evidence available including data from adequate and well-controlled clinical trials, if available, it is reasonable to believe that the product may be effective for diagnosing, treating, or preventing COVID-19, or a serious or life-threatening disease or condition caused by COVID-19; that the known and potential benefits of the product, when used to diagnose, treat, or prevent such disease or condition, outweigh the known and potential risks of such product; and that there are no adequate, approved, and available alternatives.  All of these criteria must be met to allow for the product to be used in the treatment of patients during the COVID-19 pandemic. The EUA for LAGEVRIO is in effect for the duration of the COVID-19 declaration justifying emergency use of LAGEVRIO, unless terminated or revoked (after which LAGEVRIO may no longer be used under the EUA). For patent information:  http://rogers.info/ Copyright  2021-2022 [place] [place], [place] and its affiliates. All rights reserved. [id number] Revised: March 2022

## 2021-12-16 DIAGNOSIS — N3020 Other chronic cystitis without hematuria: Secondary | ICD-10-CM | POA: Diagnosis not present

## 2021-12-16 DIAGNOSIS — N3281 Overactive bladder: Secondary | ICD-10-CM | POA: Diagnosis not present

## 2021-12-16 DIAGNOSIS — R8271 Bacteriuria: Secondary | ICD-10-CM | POA: Diagnosis not present

## 2021-12-24 ENCOUNTER — Ambulatory Visit
Admission: RE | Admit: 2021-12-24 | Discharge: 2021-12-24 | Disposition: A | Discharge status: Home / Self Care | Payer: PPO | Source: Ambulatory Visit | Attending: Adult Health | Admitting: Adult Health

## 2021-12-24 DIAGNOSIS — Z853 Personal history of malignant neoplasm of breast: Secondary | ICD-10-CM | POA: Diagnosis not present

## 2021-12-24 DIAGNOSIS — Z170 Estrogen receptor positive status [ER+]: Secondary | ICD-10-CM

## 2021-12-27 ENCOUNTER — Ambulatory Visit (INDEPENDENT_AMBULATORY_CARE_PROVIDER_SITE_OTHER): Payer: PPO | Admitting: Internal Medicine

## 2021-12-27 ENCOUNTER — Encounter: Payer: Self-pay | Admitting: Internal Medicine

## 2021-12-27 VITALS — BP 124/84 | HR 73 | Temp 98.7°F | Ht 68.5 in | Wt 225.9 lb

## 2021-12-27 DIAGNOSIS — Z86010 Personal history of colonic polyps: Secondary | ICD-10-CM | POA: Diagnosis not present

## 2021-12-27 DIAGNOSIS — C50412 Malignant neoplasm of upper-outer quadrant of left female breast: Secondary | ICD-10-CM | POA: Diagnosis not present

## 2021-12-27 DIAGNOSIS — I872 Venous insufficiency (chronic) (peripheral): Secondary | ICD-10-CM

## 2021-12-27 DIAGNOSIS — Z0100 Encounter for examination of eyes and vision without abnormal findings: Secondary | ICD-10-CM

## 2021-12-27 DIAGNOSIS — Z0000 Encounter for general adult medical examination without abnormal findings: Secondary | ICD-10-CM

## 2021-12-27 DIAGNOSIS — Z79899 Other long term (current) drug therapy: Secondary | ICD-10-CM

## 2021-12-27 DIAGNOSIS — Z8679 Personal history of other diseases of the circulatory system: Secondary | ICD-10-CM | POA: Diagnosis not present

## 2021-12-27 DIAGNOSIS — G4733 Obstructive sleep apnea (adult) (pediatric): Secondary | ICD-10-CM

## 2021-12-27 DIAGNOSIS — E559 Vitamin D deficiency, unspecified: Secondary | ICD-10-CM | POA: Diagnosis not present

## 2021-12-27 DIAGNOSIS — I480 Paroxysmal atrial fibrillation: Secondary | ICD-10-CM | POA: Diagnosis not present

## 2021-12-27 DIAGNOSIS — Z23 Encounter for immunization: Secondary | ICD-10-CM

## 2021-12-27 DIAGNOSIS — Z170 Estrogen receptor positive status [ER+]: Secondary | ICD-10-CM | POA: Diagnosis not present

## 2021-12-27 LAB — CBC WITH DIFFERENTIAL/PLATELET
Basophils Absolute: 0.2 10*3/uL — ABNORMAL HIGH (ref 0.0–0.1)
Basophils Relative: 3.5 % — ABNORMAL HIGH (ref 0.0–3.0)
Eosinophils Absolute: 0.1 10*3/uL (ref 0.0–0.7)
Eosinophils Relative: 2.3 % (ref 0.0–5.0)
HCT: 41.4 % (ref 36.0–46.0)
Hemoglobin: 14.3 g/dL (ref 12.0–15.0)
Lymphocytes Relative: 23.7 % (ref 12.0–46.0)
Lymphs Abs: 1 10*3/uL (ref 0.7–4.0)
MCHC: 34.6 g/dL (ref 30.0–36.0)
MCV: 102.7 fl — ABNORMAL HIGH (ref 78.0–100.0)
Monocytes Absolute: 0.7 10*3/uL (ref 0.1–1.0)
Monocytes Relative: 16.6 % — ABNORMAL HIGH (ref 3.0–12.0)
Neutro Abs: 2.4 10*3/uL (ref 1.4–7.7)
Neutrophils Relative %: 53.9 % (ref 43.0–77.0)
Platelets: 199 10*3/uL (ref 150.0–400.0)
RBC: 4.03 Mil/uL (ref 3.87–5.11)
RDW: 12.7 % (ref 11.5–15.5)
WBC: 4.4 10*3/uL (ref 4.0–10.5)

## 2021-12-27 LAB — COMPREHENSIVE METABOLIC PANEL
ALT: 20 U/L (ref 0–35)
AST: 24 U/L (ref 0–37)
Albumin: 4.5 g/dL (ref 3.5–5.2)
Alkaline Phosphatase: 72 U/L (ref 39–117)
BUN: 20 mg/dL (ref 6–23)
CO2: 27 mEq/L (ref 19–32)
Calcium: 9.8 mg/dL (ref 8.4–10.5)
Chloride: 101 mEq/L (ref 96–112)
Creatinine, Ser: 0.81 mg/dL (ref 0.40–1.20)
GFR: 72.34 mL/min (ref >60.00)
Glucose, Bld: 89 mg/dL (ref 70–99)
Potassium: 4.8 mEq/L (ref 3.5–5.1)
Sodium: 135 mEq/L (ref 135–145)
Total Bilirubin: 0.5 mg/dL (ref 0.2–1.2)
Total Protein: 7.1 g/dL (ref 6.0–8.3)

## 2021-12-27 LAB — LIPID PANEL
Cholesterol: 120 mg/dL (ref 0–200)
HDL: 37 mg/dL — ABNORMAL LOW (ref >39.00)
LDL Cholesterol: 73 mg/dL (ref 0–99)
NonHDL: 83.02
Total CHOL/HDL Ratio: 3
Triglycerides: 50 mg/dL (ref 0.0–149.0)
VLDL: 10 mg/dL (ref 0.0–40.0)

## 2021-12-27 LAB — VITAMIN D 25 HYDROXY (VIT D DEFICIENCY, FRACTURES): VITD: 47.62 ng/mL (ref 30.00–100.00)

## 2021-12-27 NOTE — Patient Instructions (Signed)
-  Nice seeing you today!!  ,-Lab work today; will notify you once results are available.  -Flu vaccine today.  -COVID vaccine end of November followed by shingles vaccines at pharmacy.  -Schedule follow up in 1 year or sooner as needed.

## 2021-12-27 NOTE — Progress Notes (Signed)
Established Patient Office Visit     CC/Reason for Visit: Annual preventive exam and subsequent Medicare wellness visit  HPI: [name] is a 71 y.o. female who is coming in today for the above mentioned reasons. 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  She is doing well.  She is requesting flu vaccine.  In September she was diagnosed with COVID-19 and completed a course of molnupiravir, she has recovered well.   Past Medical/Surgical History: Past Medical History:  Diagnosis Date   Chronic edema    Clotting disorder (HCC)    Constipation    Difficult intubation    pt states that her neck needs to be in a neutral position,  scoliosisand herniated dics in neck   Family history of colon cancer    Family history of uterine cancer    Gallstones    GERD (gastroesophageal reflux disease)    protonix, hx h. pylori   Herniated disc, cervical    History of hiatal hernia    History of sebaceous adenoma    History of uterine cancer 04/22/2014   sees Dr. 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 Renda Rolls in dermatology right upper arm   Obesity    OSA on CPAP    uses CPAP   Paroxysmal atrial fibrillation (HCC)    Peripheral vascular disease (HCC)    PONV (postoperative nausea and vomiting)    Recurrent UTI    S/P hip replacement    Scoliosis    Sleep apnea    Thyroid nodule    Urinary incontinence    Uterine cancer ([place])    Stage 1   Venous insufficiency    s/p ablation    Past Surgical History:  Procedure Laterality Date   ABDOMINAL HYSTERECTOMY  09/10/2013   ATRIAL FIBRILLATION ABLATION N/A 08/09/2019   Procedure: ATRIAL FIBRILLATION ABLATION;   Surgeon: Constance Haw, MD;  Location: [facility];  Service: Cardiovascular;  Laterality: N/A;   BREAST LUMPECTOMY WITH RADIOACTIVE SEED AND SENTINEL LYMPH NODE BIOPSY Left 02/16/2021   Procedure: LEFT BREAST LUMPECTOMY WITH RADIOACTIVE SEED X2 AND SENTINEL LYMPH NODE BIOPSY;  Surgeon: Erroll Luna, MD;  Location: [facility];  Service: General;  Laterality: Left;   BUNIONECTOMY  10/1976   CHOLECYSTECTOMY N/A 03/12/2019   Procedure: XI ROBOTIC ASSISTED CHOLECYSTECTOMY;  Surgeon: Clovis Riley, MD;  [facility];  Service: General;  Laterality: N/A;   COLONOSCOPY     COLONOSCOPY WITH PROPOFOL N/A 01/17/2020   Procedure: COLONOSCOPY WITH PROPOFOL;  Surgeon: Jackquline Denmark, MD;  [facility];  Service: Endoscopy;  Laterality: N/A;   DRUG INDUCED ENDOSCOPY N/A 03/04/2020   Procedure: DRUG INDUCED ENDOSCOPY;  Surgeon: Melida Quitter, MD;  Location: [facility];  Service: ENT;  Laterality: N/A;   FRACTURE SURGERY  09/1964   floor of orbit right side   HIATAL HERNIA REPAIR     IMPLANTATION OF HYPOGLOSSAL NERVE STIMULATOR Right 04/15/2020   Procedure: IMPLANTATION OF HYPOGLOSSAL NERVE STIMULATOR;  Surgeon: Melida Quitter, MD;  Location: [facility];  Service: ENT;  Laterality: Right;   JOINT REPLACEMENT     bilateral hip replacements  MELANOMA EXCISION  12/2011   POLYPECTOMY  01/17/2020   Procedure: POLYPECTOMY;  Surgeon: Jackquline Denmark, MD;  [facility];  Service: Endoscopy;;   TOTAL HIP ARTHROPLASTY Left 06/17/2014   Procedure: LEFT TOTAL HIP ARTHROPLASTY ANTERIOR APPROACH;  Surgeon: Mcarthur Rossetti, MD;  Location: [facility];  Service: Orthopedics;  Laterality: Left;   TOTAL HIP ARTHROPLASTY Right 09/02/2014   Procedure: RIGHT TOTAL HIP ARTHROPLASTY ANTERIOR APPROACH;  Surgeon: Mcarthur Rossetti, MD;  Location: [facility] [place];  Service: Orthopedics;  Laterality: Right;   VEIN SURGERY  05/2011   venous ablation     Social  History:  reports that she has never smoked. She has never used smokeless tobacco. She reports that she does not currently use alcohol. She reports that she does not use drugs.  Allergies: Allergies  Allergen Reactions   Hydrolyzed Silk Hives and Other (See Comments)    Silk tape-blisters   Ciprofloxacin Other (See Comments)    Reactions with antiarrythmic Reactions with antiarrythmic   Gluten Meal Other (See Comments)    reflux   Metoprolol     dizzy    Family History:  Family History  Problem Relation Age of Onset   Uterine cancer Mother    Emphysema Mother    Diabetes Brother 22   Hypertension Father 73   Hyperlipidemia Father    Epilepsy Father 32   Colon cancer Father 58   Leukemia Father    Arthritis Sister 20   Cancer Maternal Grandmother        unk type possibly breast or skin   Other Paternal Grandmother        influenza    Stroke Paternal Grandfather    Cancer Paternal Uncle        unk type   Skin cancer Daughter        SCC on back   Esophageal cancer Neg Hx    Rectal cancer Neg Hx    Stomach cancer Neg Hx      Current Outpatient Medications:    acetaminophen (TYLENOL) 500 MG tablet, Take 2 tablets (1,000 mg total) by mouth every 6 (six) hours as needed., Disp: , Rfl: 0   Cranberry 500 MG TABS, Take 1,000 mg by mouth 2 (two) times daily., Disp: , Rfl:    Cyanocobalamin (VITAMIN B 12) 500 MCG TABS, Take 1 tablet by mouth daily., Disp: , Rfl:    diltiazem (CARDIZEM) 60 MG tablet, Take 60 mg daily if needed for palpitations, Disp: 30 tablet, Rfl: 9   diltiazem (TIAZAC) 240 MG 24 hr capsule, TAKE 1 CAPSULE BY MOUTH EVERY DAY, Disp: 90 capsule, Rfl: 3   ELIQUIS 5 MG TABS tablet, TAKE 1 TABLET(5 MG) BY MOUTH TWICE DAILY, Disp: 180 tablet, Rfl: 1   ibuprofen (ADVIL) 200 MG tablet, Take 400 mg by mouth every 6 (six) hours as needed (pain)., Disp: , Rfl:    letrozole (FEMARA) 2.5 MG tablet, Take 1 tablet (2.5 mg total) by mouth daily., Disp: 90 tablet, Rfl: 3    omeprazole (PRILOSEC) 20 MG capsule, Take 20 mg by mouth daily as needed (heartburn)., Disp: , Rfl:    OVER THE COUNTER MEDICATION, Take 20 mg by mouth daily as needed (arthritis). CBD oil, Disp: , Rfl:    polyethylene glycol (MIRALAX / GLYCOLAX) 17 g packet, Take 17 g by mouth as needed (as directed)., Disp: , Rfl:    sennosides-docusate sodium (SENOKOT-S) 8.6-50 MG tablet, Take 1 tablet by mouth as needed for constipation., Disp: , Rfl:  spironolactone (ALDACTONE) 50 MG tablet, TAKE 2 TABLETS BY MOUTH EVERY MORNING AND 1 TABLET EVERY EVENING, Disp: 270 tablet, Rfl: 3   triamcinolone cream (KENALOG) 0.1 %, Apply 1 application topically daily as needed (for dry skin). , Disp: , Rfl: 1   Vibegron (GEMTESA) 75 MG TABS, Take 75 mg by mouth daily., Disp: , Rfl:    vitamin C (ASCORBIC ACID) 500 MG tablet, Take 500 mg by mouth 2 (two) times daily., Disp: , Rfl:   Review of Systems:  Constitutional: Denies fever, chills, diaphoresis, appetite change and fatigue.  HEENT: Denies photophobia, eye pain, redness, hearing loss, ear pain, congestion, sore throat, rhinorrhea, sneezing, mouth sores, trouble swallowing, neck pain, neck stiffness and tinnitus.   Respiratory: Denies SOB, DOE, cough, chest tightness,  and wheezing.   Cardiovascular: Denies chest pain, palpitations and leg swelling.  Gastrointestinal: Denies nausea, vomiting, abdominal pain, diarrhea, constipation, blood in stool and abdominal distention.  Genitourinary: Denies dysuria, urgency, frequency, hematuria, flank pain and difficulty urinating.  Endocrine: Denies: hot or cold intolerance, sweats, changes in hair or nails, polyuria, polydipsia. Musculoskeletal: Denies myalgias, back pain, joint swelling, arthralgias and gait problem.  Skin: Denies pallor, rash and wound.  Neurological: Denies dizziness, seizures, syncope, weakness, light-headedness, numbness and headaches.  Hematological: Denies adenopathy. Easy bruising, personal or family  bleeding history  Psychiatric/Behavioral: Denies suicidal ideation, mood changes, confusion, nervousness, sleep disturbance and agitation    Physical Exam: Vitals:   12/27/21 0703  BP: 124/84  Pulse: 73  Temp: 98.7 F (37.1 C)  TempSrc: Oral  SpO2: 99%  Weight: 225 lb 14.4 oz (102.5 kg)  Height: 5' 8.5" (1.74 m)    Body mass index is 33.85 kg/m.   Constitutional: NAD, calm, comfortable Eyes: PERRL, lids and conjunctivae normal, wears corrective lenses ENMT: Mucous membranes are moist. Posterior pharynx is erythematous but clear of any exudate or lesions. Normal dentition. Tympanic membrane is pearly white, no erythema or bulging. Neck: normal, supple, no masses, no thyromegaly Respiratory: clear to auscultation bilaterally, no wheezing, no crackles. Normal respiratory effort. No accessory muscle use.  Cardiovascular: Regular rate and rhythm, no murmurs / rubs / gallops. No extremity edema. 2+ pedal pulses. No carotid bruits.  Abdomen: no tenderness, no masses palpated. No hepatosplenomegaly. Bowel sounds positive.  Musculoskeletal: no clubbing / cyanosis. No joint deformity upper and lower extremities. Good ROM, no contractures. Normal muscle tone.  Skin: no rashes, lesions, ulcers. No induration Neurologic: CN 2-12 grossly intact. Sensation intact, DTR normal. Strength 5/5 in all 4.  Psychiatric: Normal judgment and insight. Alert and oriented x 3. Normal mood.   Subsequent Medicare wellness visit   1. Risk factors, based on past  M,S,F -cardiovascular disease risk factors include age, obesity   2.  Physical activities: Just activities of daily living   3.  Depression/mood: Stable, not depressed   4.  Hearing: Decreased hearing bilaterally   5.  ADL's: Independent in all ADLs   6.  Fall risk: Low fall risk   7.  Home safety: No problems identified   8.  Height weight, and visual acuity: height and weight as above, vision:  Vision Screening   Right eye Left eye  Both eyes  Without correction     With correction '20/30 20/30 20/30 '$     9.  Counseling: Advised to update all age-appropriate vaccinations and increase physical activity   10. Lab orders based on risk factors: Laboratory update will be reviewed   11. Referral : Ophthalmology  12. Care plan: Follow-up with me in 1 year   13. Cognitive assessment: No cognitive impairment   14. Screening: Patient provided with a written and personalized 5-10 year screening schedule in the AVS. yes   15. Provider List Update: PCP, oncology, radiation oncology  16. Advance Directives: Full code   17. Opioids: Patient is not on any opioid prescriptions and has no risk factors for a substance use disorder.   [place] Office Visit from 12/27/2021 in [facility] at [place]  PHQ-9 Total Score 3          02/16/2021    1:40 PM 03/17/2021    9:27 AM 05/18/2021    9:00 AM 06/30/2021   12:02 PM 12/27/2021    7:11 AM  Fall Risk  Falls in the past year?     0  Was there an injury with Fall?     0  Fall Risk Category Calculator     0  Fall Risk Category     Low  Patient Fall Risk Level Low fall risk Low fall risk Low fall risk Low fall risk Low fall risk  Patient at Risk for Falls Due to     No Fall Risks  Fall risk Follow up     Falls evaluation completed     Impression and Plan:  Encounter for preventive health examination  Encounter for vision screening - Plan: Ambulatory referral to Ophthalmology  Malignant neoplasm of upper-outer quadrant of left breast in female, estrogen receptor positive ([place]) - Plan: CBC with Differential/Platelet, Comprehensive metabolic panel  Vitamin D deficiency - Plan: VITAMIN D 25 Hydroxy (Vit-D Deficiency, Fractures)  Severe obstructive sleep apnea-hypopnea syndrome  Hx of colonic polyps  Paroxysmal atrial fibrillation ([place])  Long term current use of antiarrhythmic drug  Chronic venous insufficiency  Hx of essential hypertension - Plan:  Lipid panel  Need for influenza vaccination   -Recommend routine eye and dental care. -Immunizations: Flu vaccine administered today, she will get COVID and shingles at pharmacy. -Healthy lifestyle discussed in detail. -Labs to be updated today. -Colon cancer screening: 01/2020, 3-year follow-up -Breast cancer screening: History of breast cancer, last mammogram 12/2021 -Cervical cancer screening: Declines due to age -Lung cancer screening: Not applicable, never smoker -Prostate cancer screening: Not applicable -DEXA: 07/3297  -Ophthalmology referral placed that she has been told she has beginnings of cataracts.     Patient Instructions  -Nice seeing you today!!  ,-Lab work today; will notify you once results are available.  -Flu vaccine today.  -COVID vaccine end of November followed by shingles vaccines at pharmacy.  -Schedule follow up in 1 year or sooner as needed.      Lelon Frohlich, MD [facility] at [facility]

## 2021-12-28 ENCOUNTER — Encounter: Payer: Self-pay | Admitting: Internal Medicine

## 2021-12-28 ENCOUNTER — Other Ambulatory Visit: Payer: Self-pay | Admitting: Internal Medicine

## 2021-12-28 ENCOUNTER — Other Ambulatory Visit (INDEPENDENT_AMBULATORY_CARE_PROVIDER_SITE_OTHER): Payer: PPO

## 2021-12-28 DIAGNOSIS — D7589 Other specified diseases of blood and blood-forming organs: Secondary | ICD-10-CM

## 2021-12-28 LAB — VITAMIN B12: Vitamin B-12: 1430 pg/mL — ABNORMAL HIGH (ref 211–911)

## 2021-12-30 LAB — FOLATE RBC: RBC Folate: 520 ng/mL RBC (ref >280)

## 2022-01-24 DIAGNOSIS — H259 Unspecified age-related cataract: Secondary | ICD-10-CM | POA: Diagnosis not present

## 2022-01-24 DIAGNOSIS — C50919 Malignant neoplasm of unspecified site of unspecified female breast: Secondary | ICD-10-CM | POA: Diagnosis not present

## 2022-01-24 DIAGNOSIS — G4733 Obstructive sleep apnea (adult) (pediatric): Secondary | ICD-10-CM | POA: Diagnosis not present

## 2022-01-24 DIAGNOSIS — I739 Peripheral vascular disease, unspecified: Secondary | ICD-10-CM | POA: Diagnosis not present

## 2022-01-24 DIAGNOSIS — K5900 Constipation, unspecified: Secondary | ICD-10-CM | POA: Diagnosis not present

## 2022-01-24 DIAGNOSIS — K219 Gastro-esophageal reflux disease without esophagitis: Secondary | ICD-10-CM | POA: Diagnosis not present

## 2022-01-24 DIAGNOSIS — M1990 Unspecified osteoarthritis, unspecified site: Secondary | ICD-10-CM | POA: Diagnosis not present

## 2022-01-24 DIAGNOSIS — E669 Obesity, unspecified: Secondary | ICD-10-CM | POA: Diagnosis not present

## 2022-01-24 DIAGNOSIS — H9190 Unspecified hearing loss, unspecified ear: Secondary | ICD-10-CM | POA: Diagnosis not present

## 2022-01-24 DIAGNOSIS — I4891 Unspecified atrial fibrillation: Secondary | ICD-10-CM | POA: Diagnosis not present

## 2022-01-24 DIAGNOSIS — E041 Nontoxic single thyroid nodule: Secondary | ICD-10-CM | POA: Diagnosis not present

## 2022-01-24 DIAGNOSIS — M5030 Other cervical disc degeneration, unspecified cervical region: Secondary | ICD-10-CM | POA: Diagnosis not present

## 2022-01-25 DIAGNOSIS — L570 Actinic keratosis: Secondary | ICD-10-CM | POA: Diagnosis not present

## 2022-01-25 DIAGNOSIS — L821 Other seborrheic keratosis: Secondary | ICD-10-CM | POA: Diagnosis not present

## 2022-01-25 DIAGNOSIS — D225 Melanocytic nevi of trunk: Secondary | ICD-10-CM | POA: Diagnosis not present

## 2022-01-25 DIAGNOSIS — L814 Other melanin hyperpigmentation: Secondary | ICD-10-CM | POA: Diagnosis not present

## 2022-01-25 DIAGNOSIS — L578 Other skin changes due to chronic exposure to nonionizing radiation: Secondary | ICD-10-CM | POA: Diagnosis not present

## 2022-01-25 DIAGNOSIS — Z85820 Personal history of malignant melanoma of skin: Secondary | ICD-10-CM | POA: Diagnosis not present

## 2022-02-15 ENCOUNTER — Ambulatory Visit (INDEPENDENT_AMBULATORY_CARE_PROVIDER_SITE_OTHER): Payer: PPO | Admitting: Internal Medicine

## 2022-02-15 ENCOUNTER — Encounter: Payer: Self-pay | Admitting: Internal Medicine

## 2022-02-15 VITALS — BP 110/70 | HR 76 | Temp 98.0°F | Wt 222.2 lb

## 2022-02-15 DIAGNOSIS — M25511 Pain in right shoulder: Secondary | ICD-10-CM | POA: Diagnosis not present

## 2022-02-15 DIAGNOSIS — G8929 Other chronic pain: Secondary | ICD-10-CM

## 2022-02-15 NOTE — Progress Notes (Signed)
Acute office Visit     CC/Reason for Visit: Right shoulder pain  HPI: [name] is a 71 y.o. female who is coming in today for the above mentioned reasons.  Towards the end of September, she reached behind her with her right arm to grab a heavy purse that had a couple water bottles in it.  She describes a crunching sound and ever since then has been having difficulty with that shoulder.  It is very painful to lie on her right side.  It is achy constantly.  She does have full range of motion.  Over the last week it has slowly been getting better.  The pain has been severe enough that she is having severe difficulty sleeping.   Past Medical/Surgical History: Past Medical History:  Diagnosis Date   Chronic edema    Clotting disorder (HCC)    Constipation    Difficult intubation    pt states that her neck needs to be in a neutral position,  scoliosisand herniated dics in neck   Family history of colon cancer    Family history of uterine cancer    Gallstones    GERD (gastroesophageal reflux disease)    protonix, hx h. pylori   Herniated disc, cervical    History of hiatal hernia    History of sebaceous adenoma    History of uterine cancer 04/22/2014   sees Dr. 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 Renda Rolls in dermatology right upper arm   Obesity    OSA on CPAP    uses CPAP   Paroxysmal atrial fibrillation (HCC)    Peripheral vascular disease (HCC)    PONV (postoperative nausea and vomiting)    Recurrent UTI    S/P hip replacement    Scoliosis    Sleep apnea    Thyroid nodule    Urinary incontinence    Uterine cancer ([place])    Stage 1   Venous insufficiency    s/p ablation    Past Surgical History:  Procedure Laterality Date   ABDOMINAL HYSTERECTOMY  09/10/2013   ATRIAL FIBRILLATION ABLATION N/A 08/09/2019   Procedure: ATRIAL FIBRILLATION ABLATION;  Surgeon: Constance Haw, MD;  Location: [facility];  Service: Cardiovascular;  Laterality: N/A;   BREAST LUMPECTOMY WITH RADIOACTIVE SEED AND SENTINEL LYMPH NODE BIOPSY Left 02/16/2021   Procedure: LEFT BREAST LUMPECTOMY WITH RADIOACTIVE SEED X2 AND SENTINEL LYMPH NODE BIOPSY;  Surgeon: Erroll Luna, MD;  Location: [facility];  Service: General;  Laterality: Left;   BUNIONECTOMY  10/1976   CHOLECYSTECTOMY N/A 03/12/2019   Procedure: XI ROBOTIC ASSISTED CHOLECYSTECTOMY;  Surgeon: Clovis Riley, MD;  [facility];  Service: General;  Laterality: N/A;   COLONOSCOPY     COLONOSCOPY WITH PROPOFOL N/A 01/17/2020   Procedure: COLONOSCOPY WITH PROPOFOL;  Surgeon: Jackquline Denmark, MD;  [facility];  Service: Endoscopy;  Laterality: N/A;   DRUG INDUCED ENDOSCOPY N/A 03/04/2020   Procedure: DRUG INDUCED ENDOSCOPY;  Surgeon: Melida Quitter, MD;  Location: [facility];  Service: ENT;  Laterality: N/A;   FRACTURE SURGERY  09/1964   floor of orbit right side   HIATAL HERNIA REPAIR     IMPLANTATION OF HYPOGLOSSAL NERVE STIMULATOR Right 04/15/2020   Procedure: IMPLANTATION OF HYPOGLOSSAL NERVE STIMULATOR;  Surgeon: Melida Quitter, MD;  Location: [facility];  Service: ENT;  Laterality: Right;   JOINT REPLACEMENT     bilateral hip replacements   MELANOMA EXCISION  12/2011   POLYPECTOMY  01/17/2020   Procedure: POLYPECTOMY;  Surgeon: Jackquline Denmark, MD;  [facility];  Service: Endoscopy;;   TOTAL HIP ARTHROPLASTY Left 06/17/2014   Procedure: LEFT TOTAL HIP ARTHROPLASTY ANTERIOR APPROACH;  Surgeon: Mcarthur Rossetti, MD;  Location: [facility];  Service: Orthopedics;  Laterality: Left;   TOTAL HIP ARTHROPLASTY Right 09/02/2014   Procedure: RIGHT TOTAL HIP ARTHROPLASTY ANTERIOR APPROACH;  Surgeon: Mcarthur Rossetti, MD;  Location: [facility];  Service: Orthopedics;  Laterality: Right;   VEIN SURGERY  05/2011   venous ablation     Social History:   reports that she has never smoked. She has never used smokeless tobacco. She reports that she does not currently use alcohol. She reports that she does not use drugs.  Allergies: Allergies  Allergen Reactions   Hydrolyzed Silk Hives and Other (See Comments)    Silk tape-blisters   Ciprofloxacin Other (See Comments)    Reactions with antiarrythmic Reactions with antiarrythmic   Gluten Meal Other (See Comments)    reflux   Metoprolol     dizzy    Family History:  Family History  Problem Relation Age of Onset   Uterine cancer Mother    Emphysema Mother    Diabetes Brother 10   Hypertension Father 5   Hyperlipidemia Father    Epilepsy Father 66   Colon cancer Father 32   Leukemia Father    Arthritis Sister 88   Cancer Maternal Grandmother        unk type possibly breast or skin   Other Paternal Grandmother        influenza    Stroke Paternal Grandfather    Cancer Paternal Uncle        unk type   Skin cancer Daughter        SCC on back   Esophageal cancer Neg Hx    Rectal cancer Neg Hx    Stomach cancer Neg Hx      Current Outpatient Medications:    acetaminophen (TYLENOL) 500 MG tablet, Take 2 tablets (1,000 mg total) by mouth every 6 (six) hours as needed., Disp: , Rfl: 0   Cranberry 500 MG TABS, Take 1,000 mg by mouth 2 (two) times daily., Disp: , Rfl:    Cyanocobalamin (VITAMIN B 12) 500 MCG TABS, Take 1 tablet by mouth daily., Disp: , Rfl:    diltiazem (CARDIZEM) 60 MG tablet, Take 60 mg daily if needed for palpitations, Disp: 30 tablet, Rfl: 9   diltiazem (TIAZAC) 240 MG 24 hr capsule, TAKE 1 CAPSULE BY MOUTH EVERY DAY, Disp: 90 capsule, Rfl: 3   ELIQUIS 5 MG TABS tablet, TAKE 1 TABLET(5 MG) BY MOUTH TWICE DAILY, Disp: 180 tablet, Rfl: 1   ibuprofen (ADVIL) 200 MG tablet, Take 400 mg by mouth every 6 (six) hours as needed (pain)., Disp: , Rfl:    letrozole (FEMARA) 2.5 MG tablet, Take 1 tablet (2.5 mg total) by mouth daily., Disp: 90 tablet, Rfl: 3   omeprazole  (PRILOSEC) 20 MG capsule, Take 20 mg by mouth daily as needed (heartburn)., Disp: , Rfl:    OVER THE COUNTER MEDICATION, Take 20 mg by mouth daily as needed (arthritis). CBD oil, Disp: , Rfl:    polyethylene glycol (MIRALAX / GLYCOLAX) 17 g packet, Take 17 g by mouth as needed (as directed)., Disp: , Rfl:  sennosides-docusate sodium (SENOKOT-S) 8.6-50 MG tablet, Take 1 tablet by mouth as needed for constipation., Disp: , Rfl:    spironolactone (ALDACTONE) 50 MG tablet, TAKE 2 TABLETS BY MOUTH EVERY MORNING AND 1 TABLET EVERY EVENING, Disp: 270 tablet, Rfl: 3   triamcinolone cream (KENALOG) 0.1 %, Apply 1 application topically daily as needed (for dry skin). , Disp: , Rfl: 1   Vibegron (GEMTESA) 75 MG TABS, Take 75 mg by mouth daily., Disp: , Rfl:    vitamin C (ASCORBIC ACID) 500 MG tablet, Take 500 mg by mouth 2 (two) times daily., Disp: , Rfl:   Review of Systems:  Constitutional: Denies fever, chills, diaphoresis, appetite change.  HEENT: Denies photophobia, eye pain, redness, hearing loss, ear pain, congestion, sore throat, rhinorrhea, sneezing, mouth sores, trouble swallowing, neck pain, neck stiffness and tinnitus.   Respiratory: Denies SOB, DOE, cough, chest tightness,  and wheezing.   Cardiovascular: Denies chest pain, palpitations and leg swelling.  Gastrointestinal: Denies nausea, vomiting, abdominal pain, diarrhea, constipation, blood in stool and abdominal distention.  Genitourinary: Denies dysuria, urgency, frequency, hematuria, flank pain and difficulty urinating.  Endocrine: Denies: hot or cold intolerance, sweats, changes in hair or nails, polyuria, polydipsia. Skin: Denies pallor, rash and wound.  Neurological: Denies dizziness, seizures, syncope, weakness, light-headedness, numbness and headaches.  Hematological: Denies adenopathy. Easy bruising, personal or family bleeding history  Psychiatric/Behavioral: Denies suicidal ideation, mood changes, confusion, nervousness, sleep  disturbance and agitation    Physical Exam: Vitals:   02/15/22 1057  BP: 110/70  Pulse: 76  Temp: 98 F (36.7 C)  TempSrc: Oral  SpO2: 98%  Weight: 222 lb 3.2 oz (100.8 kg)    Body mass index is 33.29 kg/m.   Constitutional: NAD, calm, comfortable Eyes: PERRL, lids and conjunctivae normal ENMT: Mucous membranes are moist.  Musculoskeletal: Shoulder without crepitus, edema or erythema, she has full range of motion in frontal and lateral raise.  She also has good external rotation. Skin: no rashes, lesions, ulcers. No induration Neurologic: CN 2-12 grossly intact. Sensation intact, DTR normal. Strength 5/5 in all 4.  Psychiatric: Normal judgment and insight. Alert and oriented x 3. Normal mood.    Impression and Plan:  Chronic right shoulder pain - Plan: Ambulatory referral to Orthopedic Surgery  -Etiology not completely clear, possibly bursitis, osteoarthritis, nerve impingement, full rotator cuff tear unlikely given range of motion. -Referral to orthopedics placed today.  Time spent:23 minutes reviewing chart, interviewing and examining patient and formulating plan of care.     Lelon Frohlich, MD [facility] at [facility]

## 2022-02-21 ENCOUNTER — Telehealth: Payer: Self-pay | Admitting: *Deleted

## 2022-02-21 NOTE — Patient Instructions (Signed)
Visit Information  Thank you for taking time to visit with me today. Please don't hesitate to contact me if I can be of assistance to you.   Following are the goals we discussed today:   Goals Addressed               This Visit's Progress     COMPLETED: no needs (pt-stated)        Care Coordination Interventions: Care management services explained related to RN care manager, pharmacy and social work services. Pt has indicated no needs at this time however agreed to add contact number to MyChart if needed in the future.         Please call the care guide team at [phone] if you need to cancel or reschedule your appointment.   If you are experiencing a Mental Health or [place] or need someone to talk to, please call the Suicide and Crisis Lifeline: 988 call the [place] National Suicide Prevention Lifeline: 581-004-0507 or TTY: 912-508-7406 TTY (626) 660-6378) to talk to a trained counselor call 1-800-273-TALK (toll free, 24 hour hotline)  Patient verbalizes understanding of instructions and care plan provided today and agrees to view in [place]. Active MyChart status and patient understanding of how to access instructions and care plan via MyChart confirmed with patient.     No further follow up required: No needs  Raina Mina, RN Care Management Coordinator [place] Office [phone]

## 2022-02-21 NOTE — Patient Outreach (Signed)
  Care Coordination   Initial Visit Note   02/21/2022 Name: [name] MRN: [id number] DOB: May 10, 1950  [name] is a 71 y.o. year old female who sees Isaac Bliss, Rayford Halsted, MD for primary care. I spoke with  [name] by phone today.  What matters to the patients health and wellness today?  No needs    Goals Addressed               This Visit's Progress     COMPLETED: no needs (pt-stated)        Care Coordination Interventions: Care management services explained related to RN care manager, pharmacy and social work services. Pt has indicated no needs at this time however agreed to add contact number to MyChart if needed in the future.         SDOH assessments and interventions completed:  No     Care Coordination Interventions:  Yes, provided   Follow up plan: No further intervention required.   Encounter Outcome:  Pt. Visit Completed   Raina Mina, RN Care Management Coordinator [place] Office [phone]

## 2022-03-14 NOTE — Progress Notes (Signed)
Patient Care Team: Isaac Bliss, Rayford Halsted, MD as PCP - General (Internal Medicine) Constance Haw, MD as PCP - Electrophysiology (Cardiology) Debara Pickett Nadean Corwin, MD as Consulting Physician (Cardiology) Brien Few, MD as Consulting Physician (Obstetrics and Gynecology) Renda Rolls, Jennefer Bravo, MD as Referring Physician (Dermatology) Ardis Hughs, MD as Attending Physician (Urology) Nicholas Lose, MD as Consulting Physician (Hematology and Oncology) Eppie Gibson, MD as Attending Physician (Radiation Oncology) Erroll Luna, MD as Consulting Physician (General Surgery)  DIAGNOSIS: No diagnosis found.  SUMMARY OF ONCOLOGIC HISTORY: Oncology History  Malignant neoplasm of upper-outer quadrant of left breast in female, estrogen receptor positive ([place])  06/02/2019 Genetic Testing   Negative. Genes Tested include: APC*, ATM*, AXIN2, BAP1, BARD1, BMPR1A, BRCA1, BRCA2, BRIP1, CDH1, CDK4, CDKN2A (p14ARF), CDKN2A (p16INK4a), CHEK2, CTNNA1, DICER1*, EPCAM*, GREM1*, HOXB13, KIT, MEN1*, MITF*, MLH1*, MSH2*, MSH3*, MSH6*, MUTYH, NBN, NF1*, NTHL1, PALB2, PDGFRA, PMS2*, POLD1*, POLE, POT1, PTEN*, RAD50, RAD51C, RAD51D, RB1*, RNF43, SDHA*, SDHB, SDHC*, SDHD, SMAD4, SMARCA4, STK11, TP53, TSC1*, TSC2, VHL.   12/29/2020 Initial Diagnosis   Screening mammogram: 2 possible areas of distortion in the left breast 1o clock 11 O Clock. Diagnostic mammogram and [place]: large suspicious area of distortion within the superior left breast.  Both biopsies: Grade 2 IDC ER 95%, PR 95%, Ki-67 5%, HER2 equivocal by IHC, FISH negative ratio 1.08 and copy #1.95   02/16/2021 Surgery   02/16/2021: Left lumpectomy: IDC with DCIS 4.6 cm, grade 2, margins negative, 1/2 lymph nodes positive, ER 95%, PR 100%, HER2 equivocal by IHC FISH ratio 1.18: Negative, Ki-67 2%   02/16/2021 Miscellaneous   Mammaprint: low risk indicating no chemotherapy benefit   04/12/2021 - 05/21/2021 Radiation Therapy   Site Technique Total  Dose (Gy) Dose per Fx (Gy) Completed Fx Beam Energies  Breast, Left: Breast_L 3D 50/50 2 25/25 10X  Breast, Left: Breast_L_SCV_PAB 3D 50/50 2 25/25 10X  Breast, Left: Breast_L_Bst 3D 10/10 2 5/5 6X, 10X     06/2021 -  Anti-estrogen oral therapy   Letrozole daily     CHIEF COMPLIANT: Follow-up of left breast cancer   INTERVAL HISTORY: [name] is a  72 y.o. with above-mentioned history of left breast cancer having undergone lumpectomy, currently on radiation therapy. She presents to the clinic today for follow-up.    ALLERGIES:  is allergic to hydrolyzed silk, ciprofloxacin, gluten meal, and metoprolol.  MEDICATIONS:  Current Outpatient Medications  Medication Sig Dispense Refill   acetaminophen (TYLENOL) 500 MG tablet Take 2 tablets (1,000 mg total) by mouth every 6 (six) hours as needed.  0   Cranberry 500 MG TABS Take 1,000 mg by mouth 2 (two) times daily.     Cyanocobalamin (VITAMIN B 12) 500 MCG TABS Take 1 tablet by mouth daily.     diltiazem (CARDIZEM) 60 MG tablet Take 60 mg daily if needed for palpitations 30 tablet 9   diltiazem (TIAZAC) 240 MG 24 hr capsule TAKE 1 CAPSULE BY MOUTH EVERY DAY 90 capsule 3   ELIQUIS 5 MG TABS tablet TAKE 1 TABLET(5 MG) BY MOUTH TWICE DAILY 180 tablet 1   ibuprofen (ADVIL) 200 MG tablet Take 400 mg by mouth every 6 (six) hours as needed (pain).     letrozole (FEMARA) 2.5 MG tablet Take 1 tablet (2.5 mg total) by mouth daily. 90 tablet 3   omeprazole (PRILOSEC) 20 MG capsule Take 20 mg by mouth daily as needed (heartburn).     OVER THE COUNTER MEDICATION Take 20 mg by mouth  daily as needed (arthritis). CBD oil     polyethylene glycol (MIRALAX / GLYCOLAX) 17 g packet Take 17 g by mouth as needed (as directed).     sennosides-docusate sodium (SENOKOT-S) 8.6-50 MG tablet Take 1 tablet by mouth as needed for constipation.     spironolactone (ALDACTONE) 50 MG tablet TAKE 2 TABLETS BY MOUTH EVERY MORNING AND 1 TABLET EVERY EVENING 270 tablet 3    triamcinolone cream (KENALOG) 0.1 % Apply 1 application topically daily as needed (for dry skin).   1   Vibegron (GEMTESA) 75 MG TABS Take 75 mg by mouth daily.     vitamin C (ASCORBIC ACID) 500 MG tablet Take 500 mg by mouth 2 (two) times daily.     No current facility-administered medications for this visit.    PHYSICAL EXAMINATION: ECOG PERFORMANCE STATUS: {CHL ONC ECOG [phone]}  There were no vitals filed for this visit. There were no vitals filed for this visit.  BREAST:*** No palpable masses or nodules in either right or left breasts. No palpable axillary supraclavicular or infraclavicular adenopathy no breast tenderness or nipple discharge. (exam performed in the presence of a chaperone)  LABORATORY DATA:  I have reviewed the data as listed    Latest Ref Rng & Units 12/27/2021    7:46 AM 02/10/2021    9:00 AM 11/27/2020    9:24 AM  CMP  Glucose 70 - 99 mg/dL 89  91  90   BUN 6 - 23 mg/dL _0 Creatinine 0.40 - 1.20 mg/dL 0.81  0.86  0.85   Sodium 135 - 145 mEq/L 135  136  138   Potassium 3.5 - 5.1 mEq/L 4.8  4.5  4.1   Chloride 96 - 112 mEq/L 101  104  104   CO2 19 - 32 mEq/L _1 Calcium 8.4 - 10.5 mg/dL 9.8  9.0  9.2   Total Protein 6.0 - 8.3 g/dL 7.1   6.6   Total Bilirubin 0.2 - 1.2 mg/dL 0.5   0.7   Alkaline Phos 39 - 117 U/L 72   65   AST 0 - 37 U/L 24   21   ALT 0 - 35 U/L 20   27     Lab Results  Component Value Date   WBC 4.4 12/27/2021   HGB 14.3 12/27/2021   HCT 41.4 12/27/2021   MCV 102.7 (H) 12/27/2021   PLT 199.0 12/27/2021   NEUTROABS 2.4 12/27/2021    ASSESSMENT & PLAN:  No problem-specific Assessment & Plan notes found for this encounter.    No orders of the defined types were placed in this encounter.  The patient has a good understanding of the overall plan. she agrees with it. she will call with any problems that may develop before the next visit here. Total time spent: 30 mins including face to face time and time  spent for planning, charting and co-ordination of care   Suzzette Righter, [place] 03/14/22    I Gardiner Coins am acting as a Education administrator for [employer]  ***

## 2022-03-17 ENCOUNTER — Other Ambulatory Visit: Payer: Self-pay

## 2022-03-17 ENCOUNTER — Inpatient Hospital Stay: Payer: PPO | Attending: Hematology and Oncology | Admitting: Hematology and Oncology

## 2022-03-17 VITALS — BP 144/73 | HR 76 | Temp 98.5°F | Wt 226.0 lb

## 2022-03-17 DIAGNOSIS — Z79811 Long term (current) use of aromatase inhibitors: Secondary | ICD-10-CM | POA: Insufficient documentation

## 2022-03-17 DIAGNOSIS — C50412 Malignant neoplasm of upper-outer quadrant of left female breast: Secondary | ICD-10-CM | POA: Diagnosis not present

## 2022-03-17 DIAGNOSIS — Z170 Estrogen receptor positive status [ER+]: Secondary | ICD-10-CM | POA: Diagnosis not present

## 2022-03-17 MED ORDER — LETROZOLE 2.5 MG PO TABS: 2.5000 mg | ORAL_TABLET | Freq: Every day | ORAL | 3 refills | Status: DC

## 2022-03-17 NOTE — Assessment & Plan Note (Addendum)
02/16/2021: Left lumpectomy: IDC with DCIS 4.6 cm, grade 2, margins negative, 1/2 lymph nodes positive, ER 95%, PR 100%, HER2 equivocal by IHC FISH ratio 1.18: Negative, Ki-67 2% MammaPrint: Low risk Adjuvant radiation 04/13/2021 05/21/2021    Treatment plan: Adjuvant antiestrogen therapy with letrozole 2.5 mg daily x 7 years   Letrozole toxicities: Hot flashes: Marked improvement since when she started the medication. Patient lost about 20 pounds by eating healthy.  This has significantly improved her quality of life as well as the hot flashes.  Breast cancer surveillance: Breast exam 03/17/2022: Benign Mammogram 12/24/2021: Benign breast density category B  Return to clinic in 1 year for follow-up

## 2022-03-18 DIAGNOSIS — N3000 Acute cystitis without hematuria: Secondary | ICD-10-CM | POA: Diagnosis not present

## 2022-03-21 ENCOUNTER — Ambulatory Visit (INDEPENDENT_AMBULATORY_CARE_PROVIDER_SITE_OTHER): Payer: PPO | Admitting: Orthopaedic Surgery

## 2022-03-21 ENCOUNTER — Ambulatory Visit (INDEPENDENT_AMBULATORY_CARE_PROVIDER_SITE_OTHER): Payer: PPO

## 2022-03-21 DIAGNOSIS — G8929 Other chronic pain: Secondary | ICD-10-CM

## 2022-03-21 DIAGNOSIS — M25511 Pain in right shoulder: Secondary | ICD-10-CM | POA: Diagnosis not present

## 2022-03-21 NOTE — Progress Notes (Signed)
The patient is a 72 year old female who comes in for evaluation treatment of right shoulder pain.  She is listed as a new patient, however I have replaced her hips before but I have not seen her in a while.  She has had pain in the right shoulder for several years on and off but reached behind her in October and has had persistent pain in the anterior shoulder since then.  She cannot lay on her right side.  She does have a implantable device for sleep apnea.  She denies significant weakness or loss of motion and mainly its pain that she is dealing with with her shoulder.  She has had no other acute change in medical status.  She has been keeping her shoulder moving.  On examination of her right shoulder, she actually has really good range of motion of the shoulder with no decreased motion.  She is abducting her shoulder appropriately.  Her external rotation and internal rotation are also full.  She does have some weakness with external rotation.  There is some pain over the biceps tendon but the tendon does not sublux.  She is able to reach behind her well.  She has mild pain on crossarm test.  3 views of the right shoulder show no acute findings.  There is a small osteophyte off of the inferior humeral head.  The glenohumeral joint is good space as well as the AC joint.  The humeral head is not high riding.  From my standpoint I recommended a subacromial steroid injection.  Hopefully this will help her.  I do not feel that she needs physical therapy nor does she.  My next step for her would be to have my partner Dr. Rolena Infante take a look at her right shoulder under ultrasound if she continues to have persistent problems and then potentially perform a steroid injection for the front of her shoulder.

## 2022-03-30 DIAGNOSIS — H52223 Regular astigmatism, bilateral: Secondary | ICD-10-CM | POA: Diagnosis not present

## 2022-03-30 DIAGNOSIS — H2513 Age-related nuclear cataract, bilateral: Secondary | ICD-10-CM | POA: Diagnosis not present

## 2022-03-30 DIAGNOSIS — H5212 Myopia, left eye: Secondary | ICD-10-CM | POA: Diagnosis not present

## 2022-03-30 DIAGNOSIS — H33321 Round hole, right eye: Secondary | ICD-10-CM | POA: Diagnosis not present

## 2022-03-30 DIAGNOSIS — H25013 Cortical age-related cataract, bilateral: Secondary | ICD-10-CM | POA: Diagnosis not present

## 2022-03-30 DIAGNOSIS — H5201 Hypermetropia, right eye: Secondary | ICD-10-CM | POA: Diagnosis not present

## 2022-03-31 ENCOUNTER — Other Ambulatory Visit: Payer: Self-pay | Admitting: Cardiology

## 2022-03-31 DIAGNOSIS — I4891 Unspecified atrial fibrillation: Secondary | ICD-10-CM

## 2022-03-31 NOTE — Telephone Encounter (Signed)
Prescription refill request for Eliquis received. Indication: Afib  Last office visit: 08/20/21 ([place])  Scr: 0.81 (12/27/21)  Age: 72 Weight: 102.5kg  Appropriate dose and refill sent to requested pharmacy.

## 2022-04-21 ENCOUNTER — Encounter (HOSPITAL_COMMUNITY): Payer: Self-pay | Admitting: *Deleted

## 2022-04-27 ENCOUNTER — Ambulatory Visit (INDEPENDENT_AMBULATORY_CARE_PROVIDER_SITE_OTHER): Payer: PPO | Admitting: Physician Assistant

## 2022-04-27 ENCOUNTER — Encounter: Payer: Self-pay | Admitting: Physician Assistant

## 2022-04-27 DIAGNOSIS — G8929 Other chronic pain: Secondary | ICD-10-CM

## 2022-04-27 DIAGNOSIS — M25511 Pain in right shoulder: Secondary | ICD-10-CM | POA: Diagnosis not present

## 2022-04-27 MED ORDER — METHYLPREDNISOLONE 4 MG PO TBPK: ORAL_TABLET | ORAL | 0 refills | Status: DC

## 2022-04-27 NOTE — Progress Notes (Signed)
Office Visit Note   Patient: [name]           Date of Birth: 1950-10-01           MRN: [id number] Visit Date: 04/27/2022              Requested by: [name]  Chief Complaint  Patient presents with   Right Shoulder - Pain      HPI: [name] is a pleasant 72 year old patient of Dr. Trevor [name].  She comes in today for follow-up on her right shoulder.  She has had a few right shoulder injuries with falls lately.  She saw Dr. Ninfa [name] about a month to 5 weeks ago.  At that time states she did have some arthritic changes in her shoulder but most of her pain seem to coordinate with impingement and he gave her a subacromial injection.  She said this helped for a while and relieved 80% of her pain but now she is starting to have a return of pain.  Especially affects her at night.   Assessment & Plan: Visit Diagnoses:  1. Chronic right shoulder pain     Plan: Findings clinically and response to the steroid injection suggest impingement is still the major problem.  We talked about this at length.  She is having trouble sleeping and is too soon for her to have another injection.  She has taken prednisone before we could try a Medrol Dosepak.  I also think it would be good for her to engage with physical therapy.  She may follow-up with Dr. Ninfa [name] in a month or sooner with me if she has difficulties.  She has been taking ibuprofen though she is not supposed to because she is on Eliquis.  I did ask her to refrain from anti-inflammatories while she is on the Medrol Dosepak  Pain Assessment  Average Pain: Mild to moderate Current Pain: Mild Aggravating Factors: Sleeping Alleviating Factors: Ibuprofen   Follow-Up Instructions: 1 month  Ortho Exam  Patient is alert, oriented, no adenopathy, well-dressed, normal affect, normal respiratory effort. Examination of her right shoulder  she has good forward elevation internal rotation behind her back.  Strength is intact sensation is intact distally good grip strength.  She does have a mildly positive empty can test.   Imaging: No results found. No images are attached to the encounter.  Labs: Lab Results  Component Value Date   HGBA1C 5.5 11/27/2020   HGBA1C 5.2 03/20/2018   HGBA1C 5.2 09/07/2015   REPTSTATUS 02/12/2018 FINAL 02/12/2018   REPTSTATUS 02/14/2018 FINAL 02/12/2018   GRAMSTAIN  02/12/2018    RARE WBC PRESENT,BOTH PMN AND MONONUCLEAR RARE GRAM POSITIVE COCCI    CULT  02/12/2018    MODERATE Consistent with normal respiratory flora. Performed at [facility], [place] [address], [place], [place] [address]    LABORGA Abundant GROUP A STREP (S.PYOGENES) ISOLATED 08/11/2015     Lab Results  Component Value Date   ALBUMIN 4.5 12/27/2021   ALBUMIN 4.0 11/27/2020   ALBUMIN 4.0 03/06/2019    Lab Results  Component Value Date   MG 2.0 03/13/2019   MG 2.1 02/12/2018   MG 2.0 02/10/2018   Lab Results  Component Value Date   VD25OH 47.62 12/27/2021   VD25OH 37.62 02/24/2021   VD25OH 23.93 (L) 11/27/2020    No results found for: "PREALBUMIN"    Latest Ref  Rng & Units 12/27/2021    7:46 AM 11/27/2020    9:24 AM 08/07/2019    8:25 AM  CBC EXTENDED  WBC 4.0 - 10.5 K/uL 4.4  5.5  7.1   RBC 3.87 - 5.11 Mil/uL 4.03  3.96  4.40   Hemoglobin 12.0 - 15.0 g/dL 14.3  13.8  15.0   HCT 36.0 - 46.0 % 41.4  40.7  44.2   Platelets 150.0 - 400.0 K/uL 199.0  183.0  239   NEUT# 1.4 - 7.7 K/uL 2.4  3.1    Lymph# 0.7 - 4.0 K/uL 1.0  1.4       There is no height or weight on file to calculate BMI.  [name]:  [name] Placed This Encounter  Procedures   Ambulatory referral to Physical Therapy   Meds ordered this encounter  Medications   methylPREDNISolone (MEDROL DOSEPAK) 4 MG TBPK tablet    Sig: Take as directed with food    Dispense:  21 tablet    Refill:  0     Procedures: No procedures  performed  Clinical Data: No additional findings.  ROS:  All other systems negative, except as noted in the HPI. Review of Systems  Objective: Vital Signs: There were no vitals taken for this visit.  Specialty Comments:  No specialty comments available.  PMFS History: Patient Active Problem List   Diagnosis Date Noted   Macrocytosis 12/28/2021   Malignant neoplasm of upper-outer quadrant of left breast in female, estrogen receptor positive ([place]) 01/07/2021   Vitamin D deficiency 12/01/2020   Intolerance of continuous positive airway pressure (CPAP) ventilation 05/13/2020   Severe obstructive sleep apnea-hypopnea syndrome 05/13/2020   Hx of colonic polyps    Adenomatous polyp of ascending colon    Genetic testing 06/24/2019   Family history of uterine cancer    Family history of colon cancer    History of uterine cancer    History of sebaceous adenoma    S/P repair of paraesophageal hernia 03/12/2019   Syncope 02/27/2018   Community acquired pneumonia 02/10/2018   CAP (community acquired pneumonia) 02/10/2018   Long term current use of antiarrhythmic drug 11/02/2015   Varicose veins 11/02/2015   OAB (overactive bladder) 09/07/2015   History of melanoma 06/10/2015   Thyroid nodule 06/10/2015   GERD (gastroesophageal reflux disease) 05/25/2015   Chronic venous insufficiency 05/25/2015   Hx of essential hypertension 05/25/2015   Atrial fibrillation ([place]) 04/22/2014   Osteoarthritis, hip, bilateral 03/25/2014   Lumbar and sacral osteoarthritis 12/10/2013   Past Medical History:  Diagnosis Date   Chronic edema    Clotting disorder (HCC)    Constipation    Difficult intubation    pt states that her neck needs to be in a neutral position,  scoliosisand herniated dics in neck   Family history of colon cancer    Family history of uterine cancer    Gallstones    GERD (gastroesophageal reflux disease)    protonix, hx h. pylori   Herniated disc, cervical    History of  hiatal hernia    History of sebaceous adenoma    History of uterine cancer 04/22/2014   sees Dr. Ronita Hipps; s/p complete hysterectomy   Hx of colonic polyp    Lumbar and sacral osteoarthritis 12/10/2013   Melanoma ([place])    sees Dr. Renda Rolls in dermatology right upper arm   Obesity    OSA on CPAP    uses CPAP   Paroxysmal atrial fibrillation ([place])  Peripheral vascular disease (HCC)    PONV (postoperative nausea and vomiting)    Recurrent UTI    S/P hip replacement    Scoliosis    Sleep apnea    Thyroid nodule    Urinary incontinence    Uterine cancer ([place])    Stage 1   Venous insufficiency    s/p ablation    Family History  Problem Relation Age of Onset   Uterine cancer Mother    Emphysema Mother    Diabetes Brother 75   Hypertension Father 36   Hyperlipidemia Father    Epilepsy Father 63   Colon cancer Father 51   Leukemia Father    Arthritis Sister 88   Cancer Maternal Grandmother        unk type possibly breast or skin   Other Paternal Grandmother        influenza    Stroke Paternal Grandfather    Cancer Paternal Uncle        unk type   Skin cancer Daughter        SCC on back   Esophageal cancer Neg Hx    Rectal cancer Neg Hx    Stomach cancer Neg Hx     Past Surgical History:  Procedure Laterality Date   ABDOMINAL HYSTERECTOMY  09/10/2013   ATRIAL FIBRILLATION ABLATION N/A 08/09/2019   Procedure: ATRIAL FIBRILLATION ABLATION;  Surgeon: Constance Haw, [name];  Location: [facility];  Service: Cardiovascular;  Laterality: N/A;   BREAST LUMPECTOMY WITH RADIOACTIVE SEED AND SENTINEL LYMPH NODE BIOPSY Left 02/16/2021   Procedure: LEFT BREAST LUMPECTOMY WITH RADIOACTIVE SEED X2 AND SENTINEL LYMPH NODE BIOPSY;  Surgeon: Erroll Luna, [name];  Location: [facility];  Service: General;  Laterality: Left;   BUNIONECTOMY  10/1976   CHOLECYSTECTOMY N/A 03/12/2019   Procedure: XI ROBOTIC ASSISTED CHOLECYSTECTOMY;  Surgeon: Clovis Riley, [name];   Location: [facility];  Service: General;  Laterality: N/A;   COLONOSCOPY     COLONOSCOPY WITH PROPOFOL N/A 01/17/2020   Procedure: COLONOSCOPY WITH PROPOFOL;  Surgeon: Jackquline Denmark, [name];  Location: [facility];  Service: Endoscopy;  Laterality: N/A;   DRUG INDUCED ENDOSCOPY N/A 03/04/2020   Procedure: DRUG INDUCED ENDOSCOPY;  Surgeon: Melida Quitter, [name];  Location: [facility];  Service: ENT;  Laterality: N/A;   FRACTURE SURGERY  09/1964   floor of orbit right side   HIATAL HERNIA REPAIR     IMPLANTATION OF HYPOGLOSSAL NERVE STIMULATOR Right 04/15/2020   Procedure: IMPLANTATION OF HYPOGLOSSAL NERVE STIMULATOR;  Surgeon: Melida Quitter, [name];  Location: [facility];  Service: ENT;  Laterality: Right;   JOINT REPLACEMENT     bilateral hip replacements   MELANOMA EXCISION  12/2011   POLYPECTOMY  01/17/2020   Procedure: POLYPECTOMY;  Surgeon: Jackquline Denmark, [name];  Location: [facility];  Service: Endoscopy;;   TOTAL HIP ARTHROPLASTY Left 06/17/2014   Procedure: LEFT TOTAL HIP ARTHROPLASTY ANTERIOR APPROACH;  Surgeon: Mcarthur Rossetti, [name];  Location: [facility];  Service: Orthopedics;  Laterality: Left;   TOTAL HIP ARTHROPLASTY Right 09/02/2014   Procedure: RIGHT TOTAL HIP ARTHROPLASTY ANTERIOR APPROACH;  Surgeon: Mcarthur Rossetti, [name];  Location: [facility];  Service: Orthopedics;  Laterality: Right;   VEIN SURGERY  05/2011   venous ablation    Social History   Occupational History   Occupation: lawyer/RETIRED  Tobacco Use   Smoking status: Never   Smokeless tobacco: Never  Vaping Use   Vaping Use: Never used  Substance and Sexual Activity   Alcohol use: Not Currently    Comment: rarely at [facility]    Drug use: No   Sexual activity: Not Currently    Birth control/protection: Surgical

## 2022-05-04 ENCOUNTER — Emergency Department (HOSPITAL_BASED_OUTPATIENT_CLINIC_OR_DEPARTMENT_OTHER): Payer: PPO

## 2022-05-04 ENCOUNTER — Emergency Department (HOSPITAL_BASED_OUTPATIENT_CLINIC_OR_DEPARTMENT_OTHER)
Admission: EM | Admit: 2022-05-04 | Discharge: 2022-05-04 | Disposition: A | Discharge status: Home / Self Care | Payer: PPO | Attending: Emergency Medicine | Admitting: Emergency Medicine

## 2022-05-04 ENCOUNTER — Other Ambulatory Visit: Payer: Self-pay

## 2022-05-04 ENCOUNTER — Encounter (HOSPITAL_BASED_OUTPATIENT_CLINIC_OR_DEPARTMENT_OTHER): Payer: Self-pay

## 2022-05-04 DIAGNOSIS — S01111A Laceration without foreign body of right eyelid and periocular area, initial encounter: Secondary | ICD-10-CM | POA: Diagnosis not present

## 2022-05-04 DIAGNOSIS — Z7901 Long term (current) use of anticoagulants: Secondary | ICD-10-CM | POA: Diagnosis not present

## 2022-05-04 DIAGNOSIS — S0181XA Laceration without foreign body of other part of head, initial encounter: Secondary | ICD-10-CM

## 2022-05-04 DIAGNOSIS — S0990XA Unspecified injury of head, initial encounter: Secondary | ICD-10-CM | POA: Diagnosis not present

## 2022-05-04 DIAGNOSIS — W19XXXA Unspecified fall, initial encounter: Secondary | ICD-10-CM

## 2022-05-04 DIAGNOSIS — W101XXA Fall (on)(from) sidewalk curb, initial encounter: Secondary | ICD-10-CM | POA: Diagnosis not present

## 2022-05-04 DIAGNOSIS — S199XXA Unspecified injury of neck, initial encounter: Secondary | ICD-10-CM | POA: Diagnosis not present

## 2022-05-04 MED ORDER — LIDOCAINE-EPINEPHRINE (PF) 2 %-1:200000 IJ SOLN
10.0000 mL | Freq: Once | INTRAMUSCULAR | Status: AC
  Administered 2022-05-04: 10 mL
  Filled 2022-05-04: qty 20

## 2022-05-04 NOTE — Discharge Instructions (Signed)
1.  You may put over-the-counter antibiotic ointment on your laceration twice daily.  Elevate your head at 30 degrees while you sleep.  Put ice pack over the area of swelling. 2.  Make an appointment to see your doctor within the next 5 to 7 days. 3.  Return to emergency department immediately if you have a bad headache, confusion or other concerning changes. 4.  Take extra strength Tylenol every 6 hours as needed for pain.

## 2022-05-04 NOTE — ED Notes (Signed)
Provider at bedside for lac repair

## 2022-05-04 NOTE — Therapy (Incomplete)
OUTPATIENT PHYSICAL THERAPY EVALUATION   Patient Name: [name] MRN: [id number] DOB:[birth year], 72 y.o., female Today's Date: 05/04/2022  END OF SESSION:   Past Medical History:  Diagnosis Date   Chronic edema    Clotting disorder (HCC)    Constipation    Difficult intubation    pt states that her neck needs to be in a neutral position,  scoliosisand herniated dics in neck   Family history of colon cancer    Family history of uterine cancer    Gallstones    GERD (gastroesophageal reflux disease)    protonix, hx h. pylori   Herniated disc, cervical    History of hiatal hernia    History of sebaceous adenoma    History of uterine cancer 04/22/2014   sees Dr. Ronita Hipps; s/p complete hysterectomy   Hx of colonic polyp    Lumbar and sacral osteoarthritis 12/10/2013   Melanoma ([place])    sees Dr. Renda Rolls in dermatology right upper arm   Obesity    OSA on CPAP    uses CPAP   Paroxysmal atrial fibrillation (HCC)    Peripheral vascular disease (HCC)    PONV (postoperative nausea and vomiting)    Recurrent UTI    S/P hip replacement    Scoliosis    Sleep apnea    Thyroid nodule    Urinary incontinence    Uterine cancer ([place])    Stage 1   Venous insufficiency    s/p ablation   Past Surgical History:  Procedure Laterality Date   ABDOMINAL HYSTERECTOMY  09/10/2013   ATRIAL FIBRILLATION ABLATION N/A 08/09/2019   Procedure: ATRIAL FIBRILLATION ABLATION;  Surgeon: Constance Haw, MD;  Location: [facility];  Service: Cardiovascular;  Laterality: N/A;   BREAST LUMPECTOMY WITH RADIOACTIVE SEED AND SENTINEL LYMPH NODE BIOPSY Left 02/16/2021   Procedure: LEFT BREAST LUMPECTOMY WITH RADIOACTIVE SEED X2 AND SENTINEL LYMPH NODE BIOPSY;  Surgeon: Erroll Luna, MD;  Location: [place];  Service: General;  Laterality: Left;   BUNIONECTOMY  10/1976   CHOLECYSTECTOMY N/A 03/12/2019   Procedure: XI ROBOTIC ASSISTED CHOLECYSTECTOMY;  Surgeon: Clovis Riley,  MD;  Location: [facility];  Service: General;  Laterality: N/A;   COLONOSCOPY     COLONOSCOPY WITH PROPOFOL N/A 01/17/2020   Procedure: COLONOSCOPY WITH PROPOFOL;  Surgeon: Jackquline Denmark, MD;  Location: [facility];  Service: Endoscopy;  Laterality: N/A;   DRUG INDUCED ENDOSCOPY N/A 03/04/2020   Procedure: DRUG INDUCED ENDOSCOPY;  Surgeon: Melida Quitter, MD;  Location: [place] [facility] [place];  Service: ENT;  Laterality: N/A;   FRACTURE SURGERY  09/1964   floor of orbit right side   HIATAL HERNIA REPAIR     IMPLANTATION OF HYPOGLOSSAL NERVE STIMULATOR Right 04/15/2020   Procedure: IMPLANTATION OF HYPOGLOSSAL NERVE STIMULATOR;  Surgeon: Melida Quitter, MD;  Location: [place];  Service: ENT;  Laterality: Right;   JOINT REPLACEMENT     bilateral hip replacements   MELANOMA EXCISION  12/2011   POLYPECTOMY  01/17/2020   Procedure: POLYPECTOMY;  Surgeon: Jackquline Denmark, MD;  Location: [facility];  Service: Endoscopy;;   TOTAL HIP ARTHROPLASTY Left 06/17/2014   Procedure: LEFT TOTAL HIP ARTHROPLASTY ANTERIOR APPROACH;  Surgeon: Mcarthur Rossetti, MD;  Location: [place];  Service: Orthopedics;  Laterality: Left;   TOTAL HIP ARTHROPLASTY Right 09/02/2014   Procedure: RIGHT TOTAL HIP ARTHROPLASTY ANTERIOR APPROACH;  Surgeon: Mcarthur Rossetti, MD;  [facility];  Service: Orthopedics;  Laterality: Right;  VEIN SURGERY  05/2011   venous ablation    Patient Active Problem List   Diagnosis Date Noted   Macrocytosis 12/28/2021   Malignant neoplasm of upper-outer quadrant of left breast in female, estrogen receptor positive ([place]) 01/07/2021   Vitamin D deficiency 12/01/2020   Intolerance of continuous positive airway pressure (CPAP) ventilation 05/13/2020   Severe obstructive sleep apnea-hypopnea syndrome 05/13/2020   Hx of colonic polyps    Adenomatous polyp of ascending colon    Genetic testing 06/24/2019   Family history of uterine cancer    Family history of colon cancer     History of uterine cancer    History of sebaceous adenoma    S/P repair of paraesophageal hernia 03/12/2019   Syncope 02/27/2018   Community acquired pneumonia 02/10/2018   CAP (community acquired pneumonia) 02/10/2018   Long term current use of antiarrhythmic drug 11/02/2015   Varicose veins 11/02/2015   OAB (overactive bladder) 09/07/2015   History of melanoma 06/10/2015   Thyroid nodule 06/10/2015   GERD (gastroesophageal reflux disease) 05/25/2015   Chronic venous insufficiency 05/25/2015   Hx of essential hypertension 05/25/2015   Atrial fibrillation ([place]) 04/22/2014   Osteoarthritis, hip, bilateral 03/25/2014   Lumbar and sacral osteoarthritis 12/10/2013    PCP: Isaac Bliss, Rayford Halsted. MD  REFERRING PROVIDER: Persons, Bevely Palmer, [place]  REFERRING DIAG: [phone] (ICD-10-CM) - Chronic right shoulder pain  THERAPY DIAG:  No diagnosis found.  Rationale for Evaluation and Treatment: Rehabilitation  ONSET DATE: ***  SUBJECTIVE:                                                                                                                                                                                      SUBJECTIVE STATEMENT: ***  PERTINENT HISTORY: GERD, history of melanoma, history of THA, scoliosis  PAIN:  NPRS scale: ***/10 Pain location: *** Pain description: *** Aggravating factors: *** Relieving factors: ***  PRECAUTIONS: None  WEIGHT BEARING RESTRICTIONS: No  FALLS:  Has patient fallen in last 6 months? No  LIVING ENVIRONMENT: Lives with: {OPRC lives with:25569::"lives with their family"} Lives in: {Lives in:25570} Stairs: {opstairs:27293} Has following equipment at home: {Assistive devices:23999}  OCCUPATION: ***  PLOF: Independent  PATIENT GOALS:***  Next MD visit:   OBJECTIVE:   PATIENT SURVEYS:  05/05/2022 FOTO intake:     predicted:    COGNITION: 05/05/2022 Overall cognitive status:  WFL     SENSATION: 05/05/2022 {sensation:27233}  POSTURE: 05/05/2022 ***  UPPER EXTREMITY ROM:   ROM Right 05/05/2022 Left 05/05/2022  Shoulder flexion    Shoulder extension    Shoulder abduction    Shoulder adduction    Shoulder internal rotation  Shoulder external rotation    Elbow flexion    Elbow extension    Wrist flexion    Wrist extension    Wrist ulnar deviation    Wrist radial deviation    Wrist pronation    Wrist supination    (Blank rows = not tested)  UPPER EXTREMITY MMT:  MMT Right 05/05/2022 Left 05/05/2022  Shoulder flexion    Shoulder extension    Shoulder abduction    Shoulder adduction    Shoulder internal rotation    Shoulder external rotation    Middle trapezius    Lower trapezius    Elbow flexion    Elbow extension    Wrist flexion    Wrist extension    Wrist ulnar deviation    Wrist radial deviation    Wrist pronation    Wrist supination    Grip strength (lbs)    (Blank rows = not tested)  SHOULDER SPECIAL TESTS: 05/05/2022 Impingement tests: {shoulder impingement test:25231:a} SLAP lesions: {SLAP lesions:25232} Instability tests: {shoulder instability test:25233} Rotator cuff assessment: {rotator cuff assessment:25234} Biceps assessment: {biceps assessment:25235}  JOINT MOBILITY TESTING:  05/05/2022 ***  PALPATION:  05/05/2022 ***   TODAY'S TREATMENT:                                                                              DATE: 05/05/2022 Therex:    HEP instruction/performance c cues for techniques, handout provided.  Trial set performed of each for comprehension and symptom assessment.  See below for exercise list   PATIENT EDUCATION: Education details: HEP, POC Person educated: Patient Education method: Explanation, Demonstration, Verbal cues, and Handouts Education comprehension: verbalized understanding, returned demonstration, and verbal cues required  HOME EXERCISE PROGRAM: ***  ASSESSMENT:  CLINICAL  IMPRESSION: Patient is a 72 y.o. who comes to clinic with complaints of Rt shoulder pain with mobility, strength and movement coordination deficits that impair their ability to perform usual daily and recreational functional activities without increase difficulty/symptoms at this time.  Patient to benefit from skilled PT services to address impairments and limitations to improve to previous level of function without restriction secondary to condition.   OBJECTIVE IMPAIRMENTS: {opptimpairments:25111}.   ACTIVITY LIMITATIONS: {activitylimitations:27494}  PARTICIPATION LIMITATIONS: {participationrestrictions:25113}  PERSONAL FACTORS:  GERD, history of melanoma, history of THA, scoliosis  are also affecting patient's functional outcome.   REHAB POTENTIAL: Good  CLINICAL DECISION MAKING: {clinical decision making:25114}  EVALUATION COMPLEXITY: {Evaluation complexity:25115}   GOALS: Goals reviewed with patient? Yes  SHORT TERM GOALS: (target date for Short term goals are 3 weeks 05/26/2022)  1.Patient will demonstrate independent use of home exercise program to maintain progress from in clinic treatments. Goal status: New  LONG TERM GOALS: (target dates for all long term goals are 10 weeks  07/14/2022 )   1. Patient will demonstrate/report pain at worst less than or equal to 2/10 to facilitate minimal limitation in daily activity secondary to pain symptoms. Goal status: New   2. Patient will demonstrate independent use of home exercise program to facilitate ability to maintain/progress functional gains from skilled physical therapy services. Goal status: New   3. Patient will demonstrate FOTO outcome > or = *** % to indicate reduced disability due  to condition. Goal status: New   4.  Patient will demonstrate Rt UE MMT 5/5 throughout to facilitate lifting, reaching, carrying at [facility] in daily activity.   Goal status: New   5.  Patient will demonstrate Rt GH joint AROM WFL s symptoms to  facilitate usual overhead reaching, self care, dressing at PLOF.    Goal status: New   6.  *** Goal status: New   7.  *** Goal Status: New  PLAN:  PT FREQUENCY: 1-2x/week  PT DURATION: 10 weeks  PLANNED INTERVENTIONS: Therapeutic exercises, Therapeutic activity, Neuro Muscular re-education, Balance training, Gait training, Patient/Family education, Joint mobilization, Stair training, DME instructions, Dry Needling, Electrical stimulation, Traction, Cryotherapy, vasopneumatic device Moist heat, Taping, Ultrasound, Ionotophoresis 42m/ml Dexamethasone, and Manual therapy.  All included unless contraindicated  PLAN FOR NEXT SESSION: Review HEP knowledge/results.     MScot Jun PT, DPT, OCS, ATC 05/04/22  1:58 PM

## 2022-05-04 NOTE — ED Provider Notes (Signed)
[facility] Provider Note   CSN: [id number] Arrival date & time: 05/04/22  1552     History  Chief Complaint  Patient presents with   [name] is a 72 y.o. female.  HPI Patient was walking outside and tripped on a curb.  Uncomplicated mechanical fall.  Patient struck her right brow on the ground.  She denies loss of consciousness.  She is anticoagulated on Eliquis.  She denies any other injury to her extremities.  She reports she was able to get back up and ambulate without difficulty.  No hip pain or lower extremity pain.  No problems of the shoulder elbow or wrist.  No visual changes.  She did have bleeding from her laceration.    Home Medications Prior to Admission medications   Medication Sig Start Date End Date Taking? Authorizing Provider  acetaminophen (TYLENOL) 500 MG tablet Take 2 tablets (1,000 mg total) by mouth every 6 (six) hours as needed. 03/13/19   Clovis Riley, MD  Cranberry 500 MG TABS Take 1,000 mg by mouth 2 (two) times daily.    [provider]  Cyanocobalamin (VITAMIN B 12) 500 MCG TABS Take 1 tablet by mouth daily. 11/26/20   [provider]  diltiazem (CARDIZEM) 60 MG tablet Take 60 mg daily if needed for palpitations 05/14/18   Camnitz, Will Hassell Done, MD  diltiazem [facility]) 240 MG 24 hr capsule TAKE 1 CAPSULE BY MOUTH EVERY DAY 08/24/21   Camnitz, Will Hassell Done, MD  ELIQUIS 5 MG TABS tablet TAKE 1 TABLET(5 MG) BY MOUTH TWICE DAILY 03/31/22   Camnitz, Ocie Doyne, MD  ibuprofen (ADVIL) 200 MG tablet Take 400 mg by mouth every 6 (six) hours as needed (pain).    [provider]  letrozole (FEMARA) 2.5 MG tablet Take 1 tablet (2.5 mg total) by mouth daily. 03/17/22   Nicholas Lose, MD  methylPREDNISolone (MEDROL DOSEPAK) 4 MG TBPK tablet Take as directed with food 04/27/22   Persons, Bevely Palmer, PA  omeprazole (PRILOSEC) 20 MG capsule Take 20 mg by mouth daily as needed (heartburn).     [provider]  OVER THE COUNTER MEDICATION Take 20 mg by mouth daily as needed (arthritis). CBD oil    [provider]  polyethylene glycol (MIRALAX / GLYCOLAX) 17 g packet Take 17 g by mouth as needed (as directed).    [provider]  sennosides-docusate sodium (SENOKOT-S) 8.6-50 MG tablet Take 1 tablet by mouth as needed for constipation.    [provider]  spironolactone (ALDACTONE) 50 MG tablet TAKE 2 TABLETS BY MOUTH EVERY MORNING AND 1 TABLET EVERY EVENING 08/24/21   Camnitz, Ocie Doyne, MD  triamcinolone cream (KENALOG) 0.1 % Apply 1 application topically daily as needed (for dry skin).  04/03/14   [provider]  Vibegron (GEMTESA) 75 MG TABS Take 75 mg by mouth daily.    [provider]  vitamin C (ASCORBIC ACID) 500 MG tablet Take 500 mg by mouth 2 (two) times daily.    [provider]      Allergies    Hydrolyzed silk, Ciprofloxacin, Gluten meal, and Metoprolol    Review of Systems   Review of Systems  Physical Exam Updated Vital Signs BP 132/66   Pulse 65   Temp 98.5 F (36.9 C)   Resp 18   Ht 5' 9"$  (1.753 m)   Wt 102.5 kg   SpO2 98%   BMI 33.37 kg/m  Physical Exam Constitutional:      Comments: GCS 15 alert normal mental status.  No acute distress.  HENT:     Head:     Comments: Laceration over the right brow 2 cm.  Moderate forming hematoma.  The is open without swelling impeding any motion.  Normal extraocular motions.  No evidence of globe trauma.    Mouth/Throat:     Pharynx: Oropharynx is clear.  Eyes:     Extraocular Movements: Extraocular movements intact.  Cardiovascular:     Rate and Rhythm: Normal rate and regular rhythm.  Pulmonary:     Breath sounds: Normal breath sounds.  Musculoskeletal:        General: No swelling, tenderness or deformity. Normal range of motion.     Cervical back: Neck supple.     Right lower leg: No edema.     Left lower leg: No edema.  Skin:    General:  Skin is warm and dry.  Neurological:     General: No focal deficit present.     Mental Status: She is oriented to person, place, and time.     Motor: No weakness.     Coordination: Coordination normal.  Psychiatric:        Mood and Affect: Mood normal.     ED Results / Procedures / Treatments   Labs (all labs ordered are listed, but only abnormal results are displayed) Labs Reviewed - No data to display  EKG None  Radiology CT Cervical Spine Wo Contrast  Result Date: 05/04/2022 CLINICAL DATA:  Neck trauma.  Trip and fall injury. EXAM: CT CERVICAL SPINE WITHOUT CONTRAST TECHNIQUE: Multidetector CT imaging of the cervical spine was performed without intravenous contrast. Multiplanar CT image reconstructions were also generated. RADIATION DOSE REDUCTION: This exam was performed according to the departmental dose-optimization program which includes automated exposure control, adjustment of the mA and/or kV according to patient size and/or use of iterative reconstruction technique. COMPARISON:  None Available. FINDINGS: Alignment: There is slight anterior subluxation of C4 on C5. This is likely degenerative but could indicate changes due to muscle spasm or ligamentous injury. Otherwise normal alignment. Skull base and vertebrae: Skull base appears intact. No ventricular compression. No focal bone lesion or bone destruction. Bone cortex appears intact. Soft tissues and spinal canal: No prevertebral soft tissue swelling. No abnormal paraspinal soft tissue mass or infiltration. Stimulator leads demonstrated to the right buccal region. Disc levels: Degenerative changes throughout with narrowed interspaces and endplate osteophyte formation. Degenerative changes in the posterior elements with partial fusion of the posterior elements at C3-4. This is likely degenerative. Upper chest: Scarring in the lung apices. Asymmetry of the right thyroid gland with subcentimeter nodule. Other: None. IMPRESSION: 1.  Slight anterior subluxation of C4 on C5 is likely degenerative but could indicate changes due to muscle spasm or ligamentous injury. No acute displaced fractures are identified. 2. Diffuse degenerative changes throughout the cervical spine. 3. Subcentimeter incidental right thyroid nodule. No follow-up imaging is recommended. Reference: J Am Coll Radiol. 2015 Feb;12(2): 143-50 Electronically Signed   By: Lucienne Capers M.D.   On: 05/04/2022 17:07   CT Head Wo Contrast  Result Date: 05/04/2022 CLINICAL DATA:  Minor head trauma after trip and fall injury. Patient is on Eliquis. Laceration to the right eyebrow. EXAM: CT HEAD WITHOUT CONTRAST TECHNIQUE: Contiguous axial images were obtained from the base of the skull through the vertex without intravenous contrast. RADIATION DOSE REDUCTION: This exam was performed according to the departmental dose-optimization  program which includes automated exposure control, adjustment of the mA and/or kV according to patient size and/or use of iterative reconstruction technique. COMPARISON:  None Available. FINDINGS: Brain: No evidence of acute infarction, hemorrhage, hydrocephalus, extra-axial collection or mass lesion/mass effect. Vascular: No hyperdense vessel or unexpected calcification. Skull: Normal. Negative for fracture or focal lesion. Sinuses/Orbits: Paranasal sinuses and mastoid air cells are clear. Other: Right supraorbital soft tissue hematoma and laceration. No radiopaque foreign bodies. IMPRESSION: No acute intracranial abnormalities. Right supraorbital soft tissue hematoma and laceration. Electronically Signed   By: Lucienne Capers M.D.   On: 05/04/2022 17:02    Procedures .Marland KitchenLaceration Repair  Date/Time: 05/04/2022 7:48 PM  Performed by: Charlesetta Shanks, MD Authorized by: Charlesetta Shanks, MD   Consent:    Consent obtained:  Verbal   Consent given by:  Patient Laceration details:    Location:  Face   Face location:  R eyebrow   Length (cm):  2    Depth (mm):  4 Pre-procedure details:    Preparation:  Patient was prepped and draped in usual sterile fashion Exploration:    Hemostasis achieved with:  Epinephrine and direct pressure   Imaging outcome: foreign body not noted     Wound exploration: entire depth of wound visualized     Wound extent: areolar tissue violated     Contaminated: no   Treatment:    Area cleansed with:  Saline and Shur-Clens   Amount of cleaning:  Standard Skin repair:    Repair method:  Sutures   Suture size:  5-0   Suture material:  Nylon   Suture technique:  Simple interrupted   Number of sutures:  4 Approximation:    Approximation:  Close Repair type:    Repair type:  Simple     Medications Ordered in ED Medications  lidocaine-EPINEPHrine (XYLOCAINE W/EPI) 2 %-1:200000 (PF) injection 10 mL (10 mLs Infiltration Given 05/04/22 1935)    ED Course/ Medical Decision Making/ A&P                             Medical Decision Making Amount and/or Complexity of Data Reviewed Radiology: ordered.  Risk Prescription drug management.   Patient had a mechanical fall.  She is anticoagulated on Eliquis.  She struck her head.  Will obtain CT head.  Mechanism does have risk for extension cervical spine injury.  Will obtain CT C-spine.  Patient is neurologically intact.  Mental status is clear.  He does have a laceration to forehead.  CT scan reviewed by radiology no acute intracranial bleeding or trauma.  CT cervical spine without acute fracture.  Facial laceration requires suture repair.  See suture note.  Patient is at normal mental status.  No other injury besides laceration to the forehead and head injury.  Patient is anticoagulated on Eliquis.  No evidence at this time of intracranial bleeding.  She does have developing periorbital hematoma.  I anticipate she will have a large periorbital hematoma.  At this time no evidence of ocular globe injury.  Patient is counseled on home management with elevating  head of bed, ice packs and wound care.  Return precautions reviewed and included in discharge instructions.  Patient's daughter is at bedside and I have reviewed all information.        Final Clinical Impression(s) / ED Diagnoses Final diagnoses:  Fall, initial encounter  Laceration of brow without complication, initial encounter  Injury of head, initial encounter  Anticoagulated    Rx / DC Orders ED Discharge Orders     None         Charlesetta Shanks, MD 05/04/22 [phone]

## 2022-05-04 NOTE — ED Triage Notes (Addendum)
Patient here POV from Home.  Endorses Walking Dog and tripped over the Curb approximately 2.5 Hours ago. Head Injury against Sidewalk.   No LOC. Takes Eliquis. 3 cm Laceration to Right Eyebrow.   NAD Noted during Triage. A&Ox4. GCS 15. Ambulatory.

## 2022-05-05 ENCOUNTER — Ambulatory Visit: Payer: PPO | Admitting: Rehabilitative and Restorative Service Providers"

## 2022-05-11 ENCOUNTER — Encounter: Payer: Self-pay | Admitting: Internal Medicine

## 2022-05-11 ENCOUNTER — Ambulatory Visit (INDEPENDENT_AMBULATORY_CARE_PROVIDER_SITE_OTHER): Payer: PPO | Admitting: Internal Medicine

## 2022-05-11 VITALS — BP 110/70 | HR 70 | Temp 97.8°F | Wt 224.8 lb

## 2022-05-11 DIAGNOSIS — Z09 Encounter for follow-up examination after completed treatment for conditions other than malignant neoplasm: Secondary | ICD-10-CM

## 2022-05-11 DIAGNOSIS — S0181XA Laceration without foreign body of other part of head, initial encounter: Secondary | ICD-10-CM | POA: Diagnosis not present

## 2022-05-11 DIAGNOSIS — S0181XD Laceration without foreign body of other part of head, subsequent encounter: Secondary | ICD-10-CM

## 2022-05-11 NOTE — Progress Notes (Signed)
Established Patient Office Visit     CC/Reason for Visit: ED follow-up, removal of stitches  HPI: [name] is a 72 y.o. female who is coming in today for the above mentioned reasons.  On February 21 she was walking her dog and tripped over a curb injuring the right side of her face.  This was a mechanical fall in nature.  There was no loss of consciousness.  She does not have a headache.  She went to the ED as she is on a blood thinner.  Imaging of head and neck was normal.  They repaired her laceration above her right eyebrow with 4 interrupted stitches.  They are due for removal today.   Past Medical/Surgical History: Past Medical History:  Diagnosis Date   Chronic edema    Clotting disorder (HCC)    Constipation    Difficult intubation    pt states that her neck needs to be in a neutral position,  scoliosisand herniated dics in neck   Family history of colon cancer    Family history of uterine cancer    Gallstones    GERD (gastroesophageal reflux disease)    protonix, hx h. pylori   Herniated disc, cervical    History of hiatal hernia    History of sebaceous adenoma    History of uterine cancer 04/22/2014   sees Dr. Ronita Hipps; s/p complete hysterectomy   Hx of colonic polyp    Lumbar and sacral osteoarthritis 12/10/2013   Melanoma ([place])    sees Dr. Renda Rolls in dermatology right upper arm   Obesity    OSA on CPAP    uses CPAP   Paroxysmal atrial fibrillation (HCC)    Peripheral vascular disease (HCC)    PONV (postoperative nausea and vomiting)    Recurrent UTI    S/P hip replacement    Scoliosis    Sleep apnea    Thyroid nodule    Urinary incontinence    Uterine cancer ([place])    Stage 1   Venous insufficiency    s/p ablation    Past Surgical History:  Procedure Laterality Date   ABDOMINAL HYSTERECTOMY  09/10/2013   ATRIAL FIBRILLATION ABLATION N/A 08/09/2019   Procedure: ATRIAL FIBRILLATION ABLATION;  Surgeon: Constance Haw, MD;  Location: [place];  Service: Cardiovascular;  Laterality: N/A;   BREAST LUMPECTOMY WITH RADIOACTIVE SEED AND SENTINEL LYMPH NODE BIOPSY Left 02/16/2021   Procedure: LEFT BREAST LUMPECTOMY WITH RADIOACTIVE SEED X2 AND SENTINEL LYMPH NODE BIOPSY;  Surgeon: Erroll Luna, MD;  Location: [place];  Service: General;  Laterality: Left;   BUNIONECTOMY  10/1976   CHOLECYSTECTOMY N/A 03/12/2019   Procedure: XI ROBOTIC ASSISTED CHOLECYSTECTOMY;  Surgeon: Clovis Riley, MD;  [facility];  Service: General;  Laterality: N/A;   COLONOSCOPY     COLONOSCOPY WITH PROPOFOL N/A 01/17/2020   Procedure: COLONOSCOPY WITH PROPOFOL;  Surgeon: Jackquline Denmark, MD;  [facility];  Service: Endoscopy;  Laterality: N/A;   DRUG INDUCED ENDOSCOPY N/A 03/04/2020   Procedure: DRUG INDUCED ENDOSCOPY;  Surgeon: Melida Quitter, MD;  Location: [place];  Service: ENT;  Laterality: N/A;   FRACTURE SURGERY  09/1964   floor of orbit right side   HIATAL HERNIA REPAIR     IMPLANTATION OF HYPOGLOSSAL NERVE STIMULATOR Right 04/15/2020   Procedure: IMPLANTATION OF HYPOGLOSSAL NERVE STIMULATOR;  Surgeon: Melida Quitter, MD;  Location: [place];  Service: ENT;  Laterality: Right;  JOINT REPLACEMENT     bilateral hip replacements   MELANOMA EXCISION  12/2011   POLYPECTOMY  01/17/2020   Procedure: POLYPECTOMY;  Surgeon: Jackquline Denmark, MD;  [facility];  Service: Endoscopy;;   TOTAL HIP ARTHROPLASTY Left 06/17/2014   Procedure: LEFT TOTAL HIP ARTHROPLASTY ANTERIOR APPROACH;  Surgeon: Mcarthur Rossetti, MD;  Location: [place];  Service: Orthopedics;  Laterality: Left;   TOTAL HIP ARTHROPLASTY Right 09/02/2014   Procedure: RIGHT TOTAL HIP ARTHROPLASTY ANTERIOR APPROACH;  Surgeon: Mcarthur Rossetti, MD;  Location: [place];  Service: Orthopedics;  Laterality: Right;   VEIN SURGERY  05/2011   venous ablation     Social History:  reports that she has never smoked.  She has never used smokeless tobacco. She reports that she does not currently use alcohol. She reports that she does not use drugs.  Allergies: Allergies  Allergen Reactions   Hydrolyzed Silk Hives and Other (See Comments)    Silk tape-blisters   Ciprofloxacin Other (See Comments)    Reactions with antiarrythmic Reactions with antiarrythmic   Gluten Meal Other (See Comments)    reflux   Metoprolol     dizzy    Family History:  Family History  Problem Relation Age of Onset   Uterine cancer Mother    Emphysema Mother    Diabetes Brother 58   Hypertension Father 42   Hyperlipidemia Father    Epilepsy Father 55   Colon cancer Father 69   Leukemia Father    Arthritis Sister 86   Cancer Maternal Grandmother        unk type possibly breast or skin   Other Paternal Grandmother        influenza    Stroke Paternal Grandfather    Cancer Paternal Uncle        unk type   Skin cancer Daughter        SCC on back   Esophageal cancer Neg Hx    Rectal cancer Neg Hx    Stomach cancer Neg Hx      Current Outpatient Medications:    acetaminophen (TYLENOL) 500 MG tablet, Take 2 tablets (1,000 mg total) by mouth every 6 (six) hours as needed., Disp: , Rfl: 0   Cranberry 500 MG TABS, Take 1,000 mg by mouth 2 (two) times daily., Disp: , Rfl:    Cyanocobalamin (VITAMIN B 12) 500 MCG TABS, Take 1 tablet by mouth daily., Disp: , Rfl:    diltiazem (CARDIZEM) 60 MG tablet, Take 60 mg daily if needed for palpitations, Disp: 30 tablet, Rfl: 9   diltiazem (TIAZAC) 240 MG 24 hr capsule, TAKE 1 CAPSULE BY MOUTH EVERY DAY, Disp: 90 capsule, Rfl: 3   ELIQUIS 5 MG TABS tablet, TAKE 1 TABLET(5 MG) BY MOUTH TWICE DAILY, Disp: 180 tablet, Rfl: 1   ibuprofen (ADVIL) 200 MG tablet, Take 400 mg by mouth every 6 (six) hours as needed (pain)., Disp: , Rfl:    letrozole (FEMARA) 2.5 MG tablet, Take 1 tablet (2.5 mg total) by mouth daily., Disp: 90 tablet, Rfl: 3   methylPREDNISolone (MEDROL DOSEPAK) 4 MG TBPK  tablet, Take as directed with food, Disp: 21 tablet, Rfl: 0   omeprazole (PRILOSEC) 20 MG capsule, Take 20 mg by mouth daily as needed (heartburn)., Disp: , Rfl:    OVER THE COUNTER MEDICATION, Take 20 mg by mouth daily as needed (arthritis). CBD oil, Disp: , Rfl:    polyethylene glycol (MIRALAX / GLYCOLAX) 17 g packet, Take 17 g  by mouth as needed (as directed)., Disp: , Rfl:    sennosides-docusate sodium (SENOKOT-S) 8.6-50 MG tablet, Take 1 tablet by mouth as needed for constipation., Disp: , Rfl:    spironolactone (ALDACTONE) 50 MG tablet, TAKE 2 TABLETS BY MOUTH EVERY MORNING AND 1 TABLET EVERY EVENING, Disp: 270 tablet, Rfl: 3   triamcinolone cream (KENALOG) 0.1 %, Apply 1 application topically daily as needed (for dry skin). , Disp: , Rfl: 1   Vibegron (GEMTESA) 75 MG TABS, Take 75 mg by mouth daily., Disp: , Rfl:    vitamin C (ASCORBIC ACID) 500 MG tablet, Take 500 mg by mouth 2 (two) times daily., Disp: , Rfl:   Review of Systems:  Negative unless indicated in HPI.   Physical Exam: Vitals:   05/11/22 0952  BP: 110/70  Pulse: 70  Temp: 97.8 F (36.6 C)  TempSrc: Oral  SpO2: 98%  Weight: 224 lb 12.8 oz (102 kg)    Body mass index is 33.2 kg/m.   Physical Exam Vitals reviewed.  Constitutional:      Appearance: Normal appearance.  HENT:     Head: Normocephalic and atraumatic.  Eyes:     Conjunctiva/sclera: Conjunctivae normal.     Pupils: Pupils are equal, round, and reactive to light.     Comments: Facial bruising surrounding right eye. 4 stitches above right eyebrow.  Skin:    General: Skin is warm and dry.  Neurological:     General: No focal deficit present.     Mental Status: She is alert and oriented to person, place, and time.  Psychiatric:        Mood and Affect: Mood normal.        Behavior: Behavior normal.        Thought Content: Thought content normal.        Judgment: Judgment normal.      Impression and Plan:  Hospital discharge  follow-up  Facial laceration, subsequent encounter  [facility] charts reviewed in detail. -Thankfully CT scan of head and neck were without abnormality. -4 stitches have been removed today.  No signs of bleeding.  Have advised her to use sunscreen to minimize scarring.  Time spent:32 minutes reviewing chart, interviewing and examining patient and formulating plan of care.     Lelon Frohlich, MD [facility]

## 2022-05-13 NOTE — Therapy (Unsigned)
OUTPATIENT PHYSICAL THERAPY SHOULDER EVALUATION   Patient Name: [name] MRN: [id number] DOB:May 05, 1950, 72 y.o., female Today's Date: 05/16/2022  END OF SESSION:  PT End of Session - 05/16/22 1103     Visit Number 1    Number of Visits 6    Date for PT Re-Evaluation 07/11/22    Authorization Type [employer]    Progress Note Due on Visit 10    PT Start Time 1015    PT Stop Time 1105    PT Time Calculation (min) 50 min    Activity Tolerance Patient tolerated treatment well    Behavior During Therapy WFL for tasks assessed/performed             Past Medical History:  Diagnosis Date   Chronic edema    Clotting disorder (HCC)    Constipation    Difficult intubation    pt states that her neck needs to be in a neutral position,  scoliosisand herniated dics in neck   Family history of colon cancer    Family history of uterine cancer    Gallstones    GERD (gastroesophageal reflux disease)    protonix, hx h. pylori   Herniated disc, cervical    History of hiatal hernia    History of sebaceous adenoma    History of uterine cancer 04/22/2014   sees Dr. Ronita Hipps; s/p complete hysterectomy   Hx of colonic polyp    Lumbar and sacral osteoarthritis 12/10/2013   Melanoma ([place])    sees Dr. Renda Rolls in dermatology right upper arm   Obesity    OSA on CPAP    uses CPAP   Paroxysmal atrial fibrillation (HCC)    Peripheral vascular disease (HCC)    PONV (postoperative nausea and vomiting)    Recurrent UTI    S/P hip replacement    Scoliosis    Sleep apnea    Thyroid nodule    Urinary incontinence    Uterine cancer ([place])    Stage 1   Venous insufficiency    s/p ablation   Past Surgical History:  Procedure Laterality Date   ABDOMINAL HYSTERECTOMY  09/10/2013   ATRIAL FIBRILLATION ABLATION N/A 08/09/2019   Procedure: ATRIAL FIBRILLATION ABLATION;  Surgeon: Constance Haw, MD;  Location: [place] [facility];  Service: Cardiovascular;  Laterality: N/A;    BREAST LUMPECTOMY WITH RADIOACTIVE SEED AND SENTINEL LYMPH NODE BIOPSY Left 02/16/2021   Procedure: LEFT BREAST LUMPECTOMY WITH RADIOACTIVE SEED X2 AND SENTINEL LYMPH NODE BIOPSY;  Surgeon: Erroll Luna, MD;  Location: [place];  Service: General;  Laterality: Left;   BUNIONECTOMY  10/1976   CHOLECYSTECTOMY N/A 03/12/2019   Procedure: XI ROBOTIC ASSISTED CHOLECYSTECTOMY;  Surgeon: Clovis Riley, MD;  [facility];  Service: General;  Laterality: N/A;   COLONOSCOPY     COLONOSCOPY WITH PROPOFOL N/A 01/17/2020   Procedure: COLONOSCOPY WITH PROPOFOL;  Surgeon: Jackquline Denmark, MD;  [facility];  Service: Endoscopy;  Laterality: N/A;   DRUG INDUCED ENDOSCOPY N/A 03/04/2020   Procedure: DRUG INDUCED ENDOSCOPY;  Surgeon: Melida Quitter, MD;  Location: [place];  Service: ENT;  Laterality: N/A;   FRACTURE SURGERY  09/1964   floor of orbit right side   HIATAL HERNIA REPAIR     IMPLANTATION OF HYPOGLOSSAL NERVE STIMULATOR Right 04/15/2020   Procedure: IMPLANTATION OF HYPOGLOSSAL NERVE STIMULATOR;  Surgeon: Melida Quitter, MD;  Location: [place];  Service: ENT;  Laterality: Right;   JOINT REPLACEMENT  bilateral hip replacements   MELANOMA EXCISION  12/2011   POLYPECTOMY  01/17/2020   Procedure: POLYPECTOMY;  Surgeon: Jackquline Denmark, MD;  [facility];  Service: Endoscopy;;   TOTAL HIP ARTHROPLASTY Left 06/17/2014   Procedure: LEFT TOTAL HIP ARTHROPLASTY ANTERIOR APPROACH;  Surgeon: Mcarthur Rossetti, MD;  Location: [place];  Service: Orthopedics;  Laterality: Left;   TOTAL HIP ARTHROPLASTY Right 09/02/2014   Procedure: RIGHT TOTAL HIP ARTHROPLASTY ANTERIOR APPROACH;  Surgeon: Mcarthur Rossetti, MD;  Location: [place];  Service: Orthopedics;  Laterality: Right;   VEIN SURGERY  05/2011   venous ablation    Patient Active Problem List   Diagnosis Date Noted   Macrocytosis 12/28/2021   Malignant neoplasm of upper-outer  quadrant of left breast in female, estrogen receptor positive ([place]) 01/07/2021   Vitamin D deficiency 12/01/2020   Intolerance of continuous positive airway pressure (CPAP) ventilation 05/13/2020   Severe obstructive sleep apnea-hypopnea syndrome 05/13/2020   Hx of colonic polyps    Adenomatous polyp of ascending colon    Genetic testing 06/24/2019   Family history of uterine cancer    Family history of colon cancer    History of uterine cancer    History of sebaceous adenoma    S/P repair of paraesophageal hernia 03/12/2019   Syncope 02/27/2018   Community acquired pneumonia 02/10/2018   CAP (community acquired pneumonia) 02/10/2018   Long term current use of antiarrhythmic drug 11/02/2015   Varicose veins 11/02/2015   OAB (overactive bladder) 09/07/2015   History of melanoma 06/10/2015   Thyroid nodule 06/10/2015   GERD (gastroesophageal reflux disease) 05/25/2015   Chronic venous insufficiency 05/25/2015   Hx of essential hypertension 05/25/2015   Atrial fibrillation ([place]) 04/22/2014   Osteoarthritis, hip, bilateral 03/25/2014   Lumbar and sacral osteoarthritis 12/10/2013    PCP: Isaac Bliss, Rayford Halsted, MD  REFERRING PROVIDER: Persons, Bevely Palmer, PA  REFERRING DIAG: Chronic right shoulder pain [M25.511, G89.29]   THERAPY DIAG:  Muscle weakness (generalized) - Plan: PT plan of care cert/re-cert  Acute pain of right shoulder - Plan: PT plan of care cert/re-cert  Rationale for Evaluation and Treatment: Rehabilitation  ONSET DATE: October 1st 2023  SUBJECTIVE:                                                                                                                                                                                      SUBJECTIVE STATEMENT: Pt states that she has injured her shoulder 3 times over the span of 7 years. Pt was reaching behind her to grab waters that were very heavy when she felt pain and head a lot of noises. She reports  a sharp pain,  that has 80% resolved. Intermittent crepitus in her R shoulder, but denies any pain today.   PERTINENT HISTORY: Chronic edema, Clotting disorder, GERD, Herniated disc in cervical spine, Lumbar and sacral OA, severe sleep apnea, PVD, S/P THA, urinary incontinence, venous insufficiency, Uterine cancer, Scoliosis.   PAIN:  Are you having pain? No  PRECAUTIONS: Other: hx of a-fib and uterine and breast cancer.   WEIGHT BEARING RESTRICTIONS: No  FALLS:  Has patient fallen in last 6 months? Yes. Number of falls 1  LIVING ENVIRONMENT: Lives with: lives alone Lives in: House/apartment Stairs: No Has following equipment at home: None  OCCUPATION: Retired   PLOF: Independent  PATIENT GOALS:Pt would like to strengthen her R shoulder.   NEXT MD VISIT:   OBJECTIVE:   DIAGNOSTIC FINDINGS:  3 views of the right shoulder send no acute findings.  There is a small osteophyte off of the inferior humeral head but otherwise the shoulder is well located in the glenohumeral joint space is well-maintained as is the [facility] joint space.   PATIENT SURVEYS: FOTO 67.87%, 68% in 9 visits.   COGNITION: Overall cognitive status: Within functional limits for tasks assessed     SENSATION: WFL  POSTURE: Rounded shoulders.   UPPER EXTREMITY ROM:   Active ROM Right eval Left eval  Shoulder flexion [facility]  Shoulder abduction [facility]  Shoulder internal rotation [facility]  Shoulder external rotation WFL WFL  (Blank rows = not tested)  UPPER EXTREMITY MMT:  MMT Right eval Left eval  Shoulder flexion 4- 4+  Shoulder abduction 4- 4+  Shoulder internal rotation 4 4+  Shoulder external rotation 4- 4+  (Blank rows = not tested)  SHOULDER SPECIAL TESTS: SLAP lesions: Biceps load test: positive  Rotator cuff assessment: Internal rotation lag sign: negative and Belly press test: positive  Biceps assessment: Speed's test: positive   JOINT MOBILITY TESTING:  Crepitus heard with MMT, anterior  translation of R GHJ.   PALPATION:  No tenderness with palpation.    TODAY'S TREATMENT:                                                                                                                                         DATE: Creating, reviewing, and completing below HEP    PATIENT EDUCATION: Education details: Educated pt on anatomy and physiology of current symptoms, FOTO, diagnosis, prognosis, HEP,  and POC. Person educated: Patient Education method: Customer service manager Education comprehension: verbalized understanding and returned demonstration  HOME EXERCISE PROGRAM: Access Code: [email or web address] URL: [email or web address] Date: 05/16/2022 Prepared by: Rudi Heap  Exercises - Seated Scapular Retraction  - 2 x daily - 7 x weekly - 2 sets - 10 reps - Standing Shoulder Row with Anchored Resistance  - 2 x daily - 7 x weekly - 2 sets - 10 reps  ASSESSMENT:  CLINICAL IMPRESSION: Patient referred to PT for  R shoulder pain and crepitus. She demonstrated full functional ROM with pain only noted with end range IR. Pt with possible small supraspinatus tear based off pain patterns and special tests. She also demonstrates weakness on R side > L with pain noted with flexion and ER. Pt will benefit from skilled PT to strengthen R RTC to eliminate pain and functional deficits. Pt encouraged to join silver sneakers program to help continue with general strengthening outside of the clinic. Patient will benefit from skilled PT to address below impairments, limitations and improve overall function.  OBJECTIVE IMPAIRMENTS: decreased activity tolerance, decreased shoulder mobility, decreased ROM, decreased strength, impaired flexibility, impaired UE use, postural dysfunction, and pain.  ACTIVITY LIMITATIONS: reaching, lifting, carry,  cleaning, driving, and or occupation  PERSONAL FACTORS: Chronic edema, Clotting disorder, GERD, Herniated disc in cervical spine, Lumbar and sacral  OA, severe sleep apnea, PVD, S/P THA, urinary incontinence, venous insufficiency, Uterine cancer, Scoliosis,  also affecting patient's functional outcome.  REHAB POTENTIAL: Good  CLINICAL DECISION MAKING: Stable/uncomplicated  EVALUATION COMPLEXITY: Low    GOALS: Short term PT Goals Target date: 05/30/2022 Pt will be I and compliant with HEP. Baseline:  Goal status: New Pt will decrease pain by 25% overall Baseline: Goal status: New  Long term PT goals Target date: 06/27/2022 Pt will improve Rt shoulder strength to at least 4+/5 MMT to improve functional strength Baseline: Goal status: New Pt will improve FOTO to at least 68% functional to show improved function Baseline: Goal status: New Pt will be compliant with advanced HEP.  Baseline: Goal status: New Pt will report no pain with OP with MMT into ER and Flex.  Baseline: Goal status: New  PLAN: PT FREQUENCY: 1x per week   PT DURATION: 6-8 weeks.   PLANNED INTERVENTIONS (unless contraindicated): aquatic PT, Canalith repositioning, cryotherapy, Electrical stimulation, Iontophoresis with 4 mg/ml dexamethasome, Moist heat, traction, Ultrasound, gait training, Therapeutic exercise, balance training, neuromuscular re-education, patient/family education, prosthetic training, manual techniques, passive ROM, dry needling, taping, vasopnuematic device, vestibular, spinal manipulations, joint manipulations  PLAN FOR NEXT SESSION: review/update HEP PRN, start R RTC strengthening program.     Lynden Ang, PT 05/16/2022, 11:20 AM

## 2022-05-16 ENCOUNTER — Ambulatory Visit: Payer: PPO | Admitting: Physical Therapy

## 2022-05-16 ENCOUNTER — Other Ambulatory Visit: Payer: Self-pay

## 2022-05-16 DIAGNOSIS — M6281 Muscle weakness (generalized): Secondary | ICD-10-CM

## 2022-05-16 DIAGNOSIS — M25611 Stiffness of right shoulder, not elsewhere classified: Secondary | ICD-10-CM | POA: Diagnosis not present

## 2022-05-16 DIAGNOSIS — M25511 Pain in right shoulder: Secondary | ICD-10-CM

## 2022-05-23 NOTE — Therapy (Signed)
OUTPATIENT PHYSICAL THERAPY SHOULDER TREATMENT   Patient Name: [name] MRN: [id number] DOB:[birth year], 72 y.o., female Today's Date: 05/26/2022  END OF SESSION:  PT End of Session - 05/26/22 1153     Visit Number 2    Number of Visits 6    Date for PT Re-Evaluation 07/11/22    Authorization Type [employer]    Progress Note Due on Visit 10    PT Start Time 1150    PT Stop Time 1228    PT Time Calculation (min) 38 min    Activity Tolerance Patient tolerated treatment well    Behavior During Therapy WFL for tasks assessed/performed              Past Medical History:  Diagnosis Date   Chronic edema    Clotting disorder (HCC)    Constipation    Difficult intubation    pt states that her neck needs to be in a neutral position,  scoliosisand herniated dics in neck   Family history of colon cancer    Family history of uterine cancer    Gallstones    GERD (gastroesophageal reflux disease)    protonix, hx h. pylori   Herniated disc, cervical    History of hiatal hernia    History of sebaceous adenoma    History of uterine cancer 04/22/2014   sees Dr. 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 Sharyn Lull in dermatology right upper arm   Obesity    OSA on CPAP    uses CPAP   Paroxysmal atrial fibrillation (HCC)    Peripheral vascular disease (HCC)    PONV (postoperative nausea and vomiting)    Recurrent UTI    S/P hip replacement    Scoliosis    Sleep apnea    Thyroid nodule    Urinary incontinence    Uterine cancer (HCC)    Stage 1   Venous insufficiency    s/p ablation   Past Surgical History:  Procedure Laterality Date   ABDOMINAL HYSTERECTOMY  09/10/2013   ATRIAL FIBRILLATION ABLATION N/A 08/09/2019   Procedure: ATRIAL FIBRILLATION ABLATION;  Surgeon: Regan Lemming, MD;  [facility];  Service: Cardiovascular;  Laterality: N/A;    BREAST LUMPECTOMY WITH RADIOACTIVE SEED AND SENTINEL LYMPH NODE BIOPSY Left 02/16/2021   Procedure: LEFT BREAST LUMPECTOMY WITH RADIOACTIVE SEED X2 AND SENTINEL LYMPH NODE BIOPSY;  Surgeon: Harriette Bouillon, MD;  Location: [facility];  Service: General;  Laterality: Left;   BUNIONECTOMY  10/1976   CHOLECYSTECTOMY N/A 03/12/2019   Procedure: XI ROBOTIC ASSISTED CHOLECYSTECTOMY;  Surgeon: Berna Bue, MD;  Location: [facility];  Service: General;  Laterality: N/A;   COLONOSCOPY     COLONOSCOPY WITH PROPOFOL N/A 01/17/2020   Procedure: COLONOSCOPY WITH PROPOFOL;  Surgeon: Lynann Bologna, MD;  [facility];  Service: Endoscopy;  Laterality: N/A;   DRUG INDUCED ENDOSCOPY N/A 03/04/2020   Procedure: DRUG INDUCED ENDOSCOPY;  Surgeon: Christia Reading, MD;  Location: [facility];  Service: ENT;  Laterality: N/A;   FRACTURE SURGERY  09/1964   floor of orbit right side   HIATAL HERNIA REPAIR     IMPLANTATION OF HYPOGLOSSAL NERVE STIMULATOR Right 04/15/2020   Procedure: IMPLANTATION OF HYPOGLOSSAL NERVE STIMULATOR;  Surgeon: Christia Reading, MD;  Location: [facility];  Service: ENT;  Laterality: Right;   JOINT  REPLACEMENT     bilateral hip replacements   MELANOMA EXCISION  12/2011   POLYPECTOMY  01/17/2020   Procedure: POLYPECTOMY;  Surgeon: Lynann Bologna, MD;  [facility];  Service: Endoscopy;;   TOTAL HIP ARTHROPLASTY Left 06/17/2014   Procedure: LEFT TOTAL HIP ARTHROPLASTY ANTERIOR APPROACH;  Surgeon: Kathryne Hitch, MD;  [facility];  Service: Orthopedics;  Laterality: Left;   TOTAL HIP ARTHROPLASTY Right 09/02/2014   Procedure: RIGHT TOTAL HIP ARTHROPLASTY ANTERIOR APPROACH;  Surgeon: Kathryne Hitch, MD;  [facility]: [facility];  Service: Orthopedics;  Laterality: Right;   VEIN SURGERY  05/2011   venous ablation    Patient Active Problem List   Diagnosis Date Noted   Macrocytosis 12/28/2021   Malignant neoplasm of upper-outer  quadrant of left breast in female, estrogen receptor positive (HCC) 01/07/2021   Vitamin D deficiency 12/01/2020   Intolerance of continuous positive airway pressure (CPAP) ventilation 05/13/2020   Severe obstructive sleep apnea-hypopnea syndrome 05/13/2020   Hx of colonic polyps    Adenomatous polyp of ascending colon    Genetic testing 06/24/2019   Family history of uterine cancer    Family history of colon cancer    History of uterine cancer    History of sebaceous adenoma    S/P repair of paraesophageal hernia 03/12/2019   Syncope 02/27/2018   Community acquired pneumonia 02/10/2018   CAP (community acquired pneumonia) 02/10/2018   Long term current use of antiarrhythmic drug 11/02/2015   Varicose veins 11/02/2015   OAB (overactive bladder) 09/07/2015   History of melanoma 06/10/2015   Thyroid nodule 06/10/2015   GERD (gastroesophageal reflux disease) 05/25/2015   Chronic venous insufficiency 05/25/2015   Hx of essential hypertension 05/25/2015   Atrial fibrillation (HCC) 04/22/2014   Osteoarthritis, hip, bilateral 03/25/2014   Lumbar and sacral osteoarthritis 12/10/2013    PCP: Philip Aspen, Limmie Patricia, MD  REFERRING PROVIDER: Persons, [place], [place]  REFERRING DIAG: Chronic right shoulder pain [M25.511, G89.29]   THERAPY DIAG:  Muscle weakness (generalized)  Acute pain of right shoulder  Rationale for Evaluation and Treatment: Rehabilitation  ONSET DATE: October 1st 2023  SUBJECTIVE:                                                                                                                                                                                      SUBJECTIVE STATEMENT: Pt states that 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 She has been practicing a lot this week and is feeling it in her bilat UE's. The exercises have been very helpful.   Eval:  Pt states that she  has injured her shoulder 3 times over the span of 7  years. Pt was reaching behind her to grab waters that were very heavy when she felt pain and head a lot of noises. She reports a sharp pain, that has 80% resolved. Intermittent crepitus in her R shoulder, but denies any pain today.   PERTINENT HISTORY: Chronic edema, Clotting disorder, GERD, Herniated disc in cervical spine, Lumbar and sacral OA, severe sleep apnea, PVD, S/P THA, urinary incontinence, venous insufficiency, Uterine cancer, Scoliosis.   PAIN:  Are you having pain? No  PRECAUTIONS: Other: hx of a-fib and uterine and breast cancer.   WEIGHT BEARING RESTRICTIONS: No  FALLS:  Has patient fallen in last 6 months? Yes. Number of falls 1  LIVING ENVIRONMENT: Lives with: lives alone Lives in: House/apartment Stairs: No Has following equipment at home: None  OCCUPATION: Retired   PLOF: Independent  PATIENT GOALS:Pt would like to strengthen her R shoulder.   NEXT MD VISIT:   OBJECTIVE:   DIAGNOSTIC FINDINGS:  3 views of the right shoulder send no acute findings.  There is a small osteophyte off of the inferior humeral head but otherwise the shoulder is well located in the glenohumeral joint space is well-maintained as is the [facility] joint space.   PATIENT SURVEYS: FOTO 67.87%, 68% in 9 visits.   COGNITION: Overall cognitive status: Within functional limits for tasks assessed     SENSATION: WFL  POSTURE: Rounded shoulders.   UPPER EXTREMITY ROM:   Active ROM Right eval Left eval  Shoulder flexion [facility]  Shoulder abduction [facility]  Shoulder internal rotation [facility]  Shoulder external rotation WFL WFL  (Blank rows = not tested)  UPPER EXTREMITY MMT:  MMT Right eval Left eval  Shoulder flexion 4- 4+  Shoulder abduction 4- 4+  Shoulder internal rotation 4 4+  Shoulder external rotation 4- 4+  (Blank rows = not tested)  SHOULDER SPECIAL TESTS: SLAP lesions: Biceps load test: positive  Rotator cuff assessment: Internal rotation lag sign: negative and  Belly press test: positive  Biceps assessment: Speed's test: positive   JOINT MOBILITY TESTING:  Crepitus heard with MMT, anterior translation of R GHJ.   PALPATION:  No tenderness with palpation.    TODAY'S TREATMENT:                  DATE: 05/26/2022: UBE 3 min/ 3 min bkwd, PT present for subjective. Scapular retraction x20, 3 sec hold Seated Shlrd Rows with green TB 2x10  Seated Shlrd Extension with green TB 2x10  Seated ER, bilat 2x10 with green TB  OH flexion with 1lb weights 2x10  Horizontal abduction with GTB 2x10                                                                                                                            DATE: Creating, reviewing, and completing below HEP    PATIENT EDUCATION: Education details: Educated pt on anatomy and  physiology of current symptoms, FOTO, diagnosis, prognosis, HEP,  and POC. Person educated: Patient Education method: Medical illustrator Education comprehension: verbalized understanding and returned demonstration  HOME EXERCISE PROGRAM: Access Code: [email or web address] URL: [email or web address] Date: 05/16/2022 Prepared by: Royal Hawthorn  Exercises - Seated Scapular Retraction  - 2 x daily - 7 x weekly - 2 sets - 10 reps - Standing Shoulder Row with Anchored Resistance  - 2 x daily - 7 x weekly - 2 sets - 10 reps  ASSESSMENT:  CLINICAL IMPRESSION: Patient presents to first f/u PT appt. She reports compliance with her initial HEP. Session with focus on parascapular strengthening with good response. Pt unable to stand for long periods of time due to significant knee flexion. Pt states that she has trouble with lower back pain limiting her ability to stand for increased periods of time. Pt will continue to benefit from skilled PT to address continued deficits.   OBJECTIVE IMPAIRMENTS: decreased activity tolerance, decreased shoulder mobility, decreased ROM, decreased strength, impaired flexibility, impaired  UE use, postural dysfunction, and pain.  ACTIVITY LIMITATIONS: reaching, lifting, carry,  cleaning, driving, and or occupation  PERSONAL FACTORS: Chronic edema, Clotting disorder, GERD, Herniated disc in cervical spine, Lumbar and sacral OA, severe sleep apnea, PVD, S/P THA, urinary incontinence, venous insufficiency, Uterine cancer, Scoliosis,  also affecting patient's functional outcome.  REHAB POTENTIAL: Good  CLINICAL DECISION MAKING: Stable/uncomplicated  EVALUATION COMPLEXITY: Low    GOALS: Short term PT Goals Target date: 06/09/2022 Pt will be I and compliant with HEP. Baseline:  Goal status: New Pt will decrease pain by 25% overall Baseline: Goal status: New  Long term PT goals Target date: 07/07/2022 Pt will improve Rt shoulder strength to at least 4+/5 MMT to improve functional strength Baseline: Goal status: New Pt will improve FOTO to at least 68% functional to show improved function Baseline: Goal status: New Pt will be compliant with advanced HEP.  Baseline: Goal status: New Pt will report no pain with OP with MMT into ER and Flex.  Baseline: Goal status: New  PLAN: PT FREQUENCY: 1x per week   PT DURATION: 6-8 weeks.   PLANNED INTERVENTIONS (unless contraindicated): aquatic PT, Canalith repositioning, cryotherapy, Electrical stimulation, Iontophoresis with 4 mg/ml dexamethasome, Moist heat, traction, Ultrasound, gait training, Therapeutic exercise, balance training, neuromuscular re-education, patient/family education, prosthetic training, manual techniques, passive ROM, dry needling, taping, vasopnuematic device, vestibular, spinal manipulations, joint manipulations  PLAN FOR NEXT SESSION: review/update HEP PRN, start R RTC strengthening program.     Champ Mungo, PT 05/26/2022, 12:24 PM

## 2022-05-26 ENCOUNTER — Ambulatory Visit: Payer: PPO | Admitting: Physical Therapy

## 2022-05-26 ENCOUNTER — Encounter: Payer: Self-pay | Admitting: Physical Therapy

## 2022-05-26 DIAGNOSIS — M6281 Muscle weakness (generalized): Secondary | ICD-10-CM | POA: Diagnosis not present

## 2022-05-26 DIAGNOSIS — M25511 Pain in right shoulder: Secondary | ICD-10-CM | POA: Diagnosis not present

## 2022-05-31 ENCOUNTER — Ambulatory Visit: Payer: PPO | Admitting: Physical Therapy

## 2022-05-31 ENCOUNTER — Encounter: Payer: Self-pay | Admitting: Physical Therapy

## 2022-05-31 DIAGNOSIS — M25511 Pain in right shoulder: Secondary | ICD-10-CM

## 2022-05-31 DIAGNOSIS — M6281 Muscle weakness (generalized): Secondary | ICD-10-CM | POA: Diagnosis not present

## 2022-05-31 NOTE — Therapy (Signed)
OUTPATIENT PHYSICAL THERAPY SHOULDER TREATMENT   Patient Name: [name] MRN: [id number] DOB:[birth year], 72 y.o., female Today's Date: 05/31/2022  END OF SESSION:  PT End of Session - 05/31/22 0954     Visit Number 3    Number of Visits 6    Date for PT Re-Evaluation 07/11/22    Authorization Type [employer]    Progress Note Due on Visit 10    PT Start Time 0934    PT Stop Time 1018    PT Time Calculation (min) 44 min    Activity Tolerance Patient tolerated treatment well    Behavior During Therapy WFL for tasks assessed/performed              Past Medical History:  Diagnosis Date   Chronic edema    Clotting disorder (HCC)    Constipation    Difficult intubation    pt states that her neck needs to be in a neutral position,  scoliosisand herniated dics in neck   Family history of colon cancer    Family history of uterine cancer    Gallstones    GERD (gastroesophageal reflux disease)    protonix, hx h. pylori   Herniated disc, cervical    History of hiatal hernia    History of sebaceous adenoma    History of uterine cancer 04/22/2014   sees Dr. Ronita Hipps; s/p complete hysterectomy   Hx of colonic polyp    Lumbar and sacral osteoarthritis 12/10/2013   Melanoma ([place])    sees Dr. Renda Rolls in dermatology right upper arm   Obesity    OSA on CPAP    uses CPAP   Paroxysmal atrial fibrillation (HCC)    Peripheral vascular disease (HCC)    PONV (postoperative nausea and vomiting)    Recurrent UTI    S/P hip replacement    Scoliosis    Sleep apnea    Thyroid nodule    Urinary incontinence    Uterine cancer ([place])    Stage 1   Venous insufficiency    s/p ablation   Past Surgical History:  Procedure Laterality Date   ABDOMINAL HYSTERECTOMY  09/10/2013   ATRIAL FIBRILLATION ABLATION N/A 08/09/2019   Procedure: ATRIAL FIBRILLATION ABLATION;  Surgeon: Constance Haw, MD;  Location: [facility];  Service: Cardiovascular;  Laterality: N/A;    BREAST LUMPECTOMY WITH RADIOACTIVE SEED AND SENTINEL LYMPH NODE BIOPSY Left 02/16/2021   Procedure: LEFT BREAST LUMPECTOMY WITH RADIOACTIVE SEED X2 AND SENTINEL LYMPH NODE BIOPSY;  Surgeon: Erroll Luna, MD;  Location: [place];  Service: General;  Laterality: Left;   BUNIONECTOMY  10/1976   CHOLECYSTECTOMY N/A 03/12/2019   Procedure: XI ROBOTIC ASSISTED CHOLECYSTECTOMY;  Surgeon: Clovis Riley, MD;  [facility];  Service: General;  Laterality: N/A;   COLONOSCOPY     COLONOSCOPY WITH PROPOFOL N/A 01/17/2020   Procedure: COLONOSCOPY WITH PROPOFOL;  Surgeon: Jackquline Denmark, MD;  [facility];  Service: Endoscopy;  Laterality: N/A;   DRUG INDUCED ENDOSCOPY N/A 03/04/2020   Procedure: DRUG INDUCED ENDOSCOPY;  Surgeon: Melida Quitter, MD;  Location: [place];  Service: ENT;  Laterality: N/A;   FRACTURE SURGERY  09/1964   floor of orbit right side   HIATAL HERNIA REPAIR     IMPLANTATION OF HYPOGLOSSAL NERVE STIMULATOR Right 04/15/2020   Procedure: IMPLANTATION OF HYPOGLOSSAL NERVE STIMULATOR;  Surgeon: Melida Quitter, MD;  Location: [place];  Service: ENT;  Laterality: Right;   JOINT  REPLACEMENT     bilateral hip replacements   MELANOMA EXCISION  12/2011   POLYPECTOMY  01/17/2020   Procedure: POLYPECTOMY;  Surgeon: Jackquline Denmark, MD;  [facility];  Service: Endoscopy;;   TOTAL HIP ARTHROPLASTY Left 06/17/2014   Procedure: LEFT TOTAL HIP ARTHROPLASTY ANTERIOR APPROACH;  Surgeon: Mcarthur Rossetti, MD;  Location: [place];  Service: Orthopedics;  Laterality: Left;   TOTAL HIP ARTHROPLASTY Right 09/02/2014   Procedure: RIGHT TOTAL HIP ARTHROPLASTY ANTERIOR APPROACH;  Surgeon: Mcarthur Rossetti, MD;  Location: [place];  Service: Orthopedics;  Laterality: Right;   VEIN SURGERY  05/2011   venous ablation    Patient Active Problem List   Diagnosis Date Noted   Macrocytosis 12/28/2021   Malignant neoplasm of upper-outer  quadrant of left breast in female, estrogen receptor positive ([place]) 01/07/2021   Vitamin D deficiency 12/01/2020   Intolerance of continuous positive airway pressure (CPAP) ventilation 05/13/2020   Severe obstructive sleep apnea-hypopnea syndrome 05/13/2020   Hx of colonic polyps    Adenomatous polyp of ascending colon    Genetic testing 06/24/2019   Family history of uterine cancer    Family history of colon cancer    History of uterine cancer    History of sebaceous adenoma    S/P repair of paraesophageal hernia 03/12/2019   Syncope 02/27/2018   Community acquired pneumonia 02/10/2018   CAP (community acquired pneumonia) 02/10/2018   Long term current use of antiarrhythmic drug 11/02/2015   Varicose veins 11/02/2015   OAB (overactive bladder) 09/07/2015   History of melanoma 06/10/2015   Thyroid nodule 06/10/2015   GERD (gastroesophageal reflux disease) 05/25/2015   Chronic venous insufficiency 05/25/2015   Hx of essential hypertension 05/25/2015   Atrial fibrillation ([place]) 04/22/2014   Osteoarthritis, hip, bilateral 03/25/2014   Lumbar and sacral osteoarthritis 12/10/2013    PCP: Isaac Bliss, Rayford Halsted, MD  REFERRING PROVIDER: Persons, [place], [place]  REFERRING DIAG: Chronic right shoulder pain [M25.511, G89.29]   THERAPY DIAG:  Muscle weakness (generalized)  Acute pain of right shoulder  Rationale for Evaluation and Treatment: Rehabilitation  ONSET DATE: October 1st 2023  SUBJECTIVE:                                                                                                                                                                                      SUBJECTIVE STATEMENT:  Pt arriving today reporting mild pain at rest or just walking. Pt stating with activities or reaching her pain increases to 4/10. Pt feels like she has a little set back.   Eval:  Pt states that she has injured her shoulder 3 times over the span  of 7 years. Pt was reaching behind  her to grab waters that were very heavy when she felt pain and head a lot of noises. She reports a sharp pain, that has 80% resolved. Intermittent crepitus in her R shoulder, but denies any pain today.   PERTINENT HISTORY: Chronic edema, Clotting disorder, GERD, Herniated disc in cervical spine, Lumbar and sacral OA, severe sleep apnea, PVD, S/P THA, urinary incontinence, venous insufficiency, Uterine cancer, Scoliosis.   PAIN:  Are you having pain? 4/10,  Worse: reaching overhead, lifting objects Relief:  resting, over the counter meds  PRECAUTIONS: Other: hx of a-fib and uterine and breast cancer.   WEIGHT BEARING RESTRICTIONS: No  FALLS:  Has patient fallen in last 6 months? Yes. Number of falls 1  LIVING ENVIRONMENT: Lives with: lives alone Lives in: House/apartment Stairs: No Has following equipment at home: None  OCCUPATION: Retired   PLOF: Independent  PATIENT GOALS:Pt would like to strengthen her R shoulder.   NEXT MD VISIT:   OBJECTIVE:   DIAGNOSTIC FINDINGS:  3 views of the right shoulder send no acute findings.  There is a small osteophyte off of the inferior humeral head but otherwise the shoulder is well located in the glenohumeral joint space is well-maintained as is the [facility] joint space.   PATIENT SURVEYS: FOTO 67.87%, 68% in 9 visits.   COGNITION: Overall cognitive status: Within functional limits for tasks assessed     SENSATION: WFL  POSTURE: Rounded shoulders.   UPPER EXTREMITY ROM:   Active ROM Right eval Left eval  Shoulder flexion [facility]  Shoulder abduction [facility]  Shoulder internal rotation [facility]  Shoulder external rotation WFL WFL  (Blank rows = not tested)  UPPER EXTREMITY MMT:  MMT Right eval Left eval  Shoulder flexion 4- 4+  Shoulder abduction 4- 4+  Shoulder internal rotation 4 4+  Shoulder external rotation 4- 4+  (Blank rows = not tested)  SHOULDER SPECIAL TESTS: SLAP lesions: Biceps load test: positive  Rotator  cuff assessment: Internal rotation lag sign: negative and Belly press test: positive  Biceps assessment: Speed's test: positive   JOINT MOBILITY TESTING:  Crepitus heard with MMT, anterior translation of R GHJ.   PALPATION:  No tenderness with palpation.    TODAY'S TREATMENT:                 DATE: 05/31/2022: UBE 3 minutes each direction Standing Rows with green TB 2x10  Rt shoulder ER and IR red TB 2 x 10  Seated Extension with green TB 2x10  Standing shoulder extension 1 # bar 2 x 10 Wall ladder x 5 scaption (level 26) Horizontal abduction x 1 # x 10 Manual:  Grade 2-3 GH AP mobs STM using tennis ball Modalities:  Moist heat on Rt shoulder x 5 minutes                                                                              DATE: 05/26/2022: UBE 3 min/ 3 min bkwd, PT present for subjective. Scapular retraction x20, 3 sec hold Seated Shlrd Rows with green TB 2x10  Seated Shlrd Extension with green TB 2x10  Seated ER, bilat 2x10 with green  TB  OH flexion with 1lb weights 2x10  Horizontal abduction with GTB 2x10                                                                                                                            DATE: Creating, reviewing, and completing below HEP    PATIENT EDUCATION: Education details: Educated pt on anatomy and physiology of current symptoms, FOTO, diagnosis, prognosis, HEP,  and POC. Person educated: Patient Education method: Customer service manager Education comprehension: verbalized understanding and returned demonstration  HOME EXERCISE PROGRAM: Access Code: [email or web address] URL: [email or web address] Date: 05/16/2022 Prepared by: Rudi Heap  Exercises - Seated Scapular Retraction  - 2 x daily - 7 x weekly - 2 sets - 10 reps - Standing Shoulder Row with Anchored Resistance  - 2 x daily - 7 x weekly - 2 sets - 10 reps  ASSESSMENT:  CLINICAL IMPRESSION:  Pt still reporting 4/10 pain in her Rt shoulder.  Pt tolerating exercises well today. Pt was edu in self soft tissue mobilizations using a tennis ball.  Continue with gentle strengthening and shoulder mobility with skilled PT interventions.     OBJECTIVE IMPAIRMENTS: decreased activity tolerance, decreased shoulder mobility, decreased ROM, decreased strength, impaired flexibility, impaired UE use, postural dysfunction, and pain.  ACTIVITY LIMITATIONS: reaching, lifting, carry,  cleaning, driving, and or occupation  PERSONAL FACTORS: Chronic edema, Clotting disorder, GERD, Herniated disc in cervical spine, Lumbar and sacral OA, severe sleep apnea, PVD, S/P THA, urinary incontinence, venous insufficiency, Uterine cancer, Scoliosis,  also affecting patient's functional outcome.  REHAB POTENTIAL: Good  CLINICAL DECISION MAKING: Stable/uncomplicated  EVALUATION COMPLEXITY: Low    GOALS: Short term PT Goals Target date: 06/14/2022 Pt will be I and compliant with HEP. Baseline:  Goal status: on-going 05/31/22 Pt will decrease pain by 25% overall Baseline: Goal status: New  Long term PT goals Target date: 07/12/2022 Pt will improve Rt shoulder strength to at least 4+/5 MMT to improve functional strength Baseline: Goal status: New Pt will improve FOTO to at least 68% functional to show improved function Baseline: Goal status: New Pt will be compliant with advanced HEP.  Baseline: Goal status: New Pt will report no pain with OP with MMT into ER and Flex.  Baseline: Goal status: New  PLAN: PT FREQUENCY: 1x per week   PT DURATION: 6-8 weeks.   PLANNED INTERVENTIONS (unless contraindicated): aquatic PT, Canalith repositioning, cryotherapy, Electrical stimulation, Iontophoresis with 4 mg/ml dexamethasome, Moist heat, traction, Ultrasound, gait training, Therapeutic exercise, balance training, neuromuscular re-education, patient/family education, prosthetic training, manual techniques, passive ROM, dry needling, taping, vasopnuematic  device, vestibular, spinal manipulations, joint manipulations  PLAN FOR NEXT SESSION: start R RTC strengthening program, shoulder mobs, manual as needed    Kearney Hard, PT, MPT 05/31/22 10:10 AM   05/31/2022, 10:10 AM

## 2022-06-01 ENCOUNTER — Ambulatory Visit: Payer: PPO | Admitting: Orthopaedic Surgery

## 2022-06-07 ENCOUNTER — Ambulatory Visit: Payer: PPO | Admitting: Physical Therapy

## 2022-06-07 ENCOUNTER — Encounter: Payer: Self-pay | Admitting: Physical Therapy

## 2022-06-07 DIAGNOSIS — M6281 Muscle weakness (generalized): Secondary | ICD-10-CM | POA: Diagnosis not present

## 2022-06-07 DIAGNOSIS — M25511 Pain in right shoulder: Secondary | ICD-10-CM

## 2022-06-07 NOTE — Therapy (Signed)
OUTPATIENT PHYSICAL THERAPY SHOULDER TREATMENT   Patient Name: [name] MRN: [id number] DOB:05/06/1950, 72 y.o., female Today's Date: 06/07/2022  END OF SESSION:  PT End of Session - 06/07/22 1426     Visit Number 4    Number of Visits 6    Date for PT Re-Evaluation 07/11/22    Authorization Type [employer]    Progress Note Due on Visit 10    PT Start Time 1345    PT Stop Time 1430    PT Time Calculation (min) 45 min    Activity Tolerance Patient tolerated treatment well    Behavior During Therapy WFL for tasks assessed/performed              Past Medical History:  Diagnosis Date   Chronic edema    Clotting disorder (HCC)    Constipation    Difficult intubation    pt states that her neck needs to be in a neutral position,  scoliosisand herniated dics in neck   Family history of colon cancer    Family history of uterine cancer    Gallstones    GERD (gastroesophageal reflux disease)    protonix, hx h. pylori   Herniated disc, cervical    History of hiatal hernia    History of sebaceous adenoma    History of uterine cancer 04/22/2014   sees Dr. Ronita Hipps; s/p complete hysterectomy   Hx of colonic polyp    Lumbar and sacral osteoarthritis 12/10/2013   Melanoma ([place])    sees Dr. Renda Rolls in dermatology right upper arm   Obesity    OSA on CPAP    uses CPAP   Paroxysmal atrial fibrillation (HCC)    Peripheral vascular disease (HCC)    PONV (postoperative nausea and vomiting)    Recurrent UTI    S/P hip replacement    Scoliosis    Sleep apnea    Thyroid nodule    Urinary incontinence    Uterine cancer ([place])    Stage 1   Venous insufficiency    s/p ablation   Past Surgical History:  Procedure Laterality Date   ABDOMINAL HYSTERECTOMY  09/10/2013   ATRIAL FIBRILLATION ABLATION N/A 08/09/2019   Procedure: ATRIAL FIBRILLATION ABLATION;  Surgeon: Constance Haw, MD;  Location: [facility];  Service: Cardiovascular;  Laterality: N/A;    BREAST LUMPECTOMY WITH RADIOACTIVE SEED AND SENTINEL LYMPH NODE BIOPSY Left 02/16/2021   Procedure: LEFT BREAST LUMPECTOMY WITH RADIOACTIVE SEED X2 AND SENTINEL LYMPH NODE BIOPSY;  Surgeon: Erroll Luna, MD;  Location: [facility];  Service: General;  Laterality: Left;   BUNIONECTOMY  10/1976   CHOLECYSTECTOMY N/A 03/12/2019   Procedure: XI ROBOTIC ASSISTED CHOLECYSTECTOMY;  Surgeon: Clovis Riley, MD;  Location: [facility];  Service: General;  Laterality: N/A;   COLONOSCOPY     COLONOSCOPY WITH PROPOFOL N/A 01/17/2020   Procedure: COLONOSCOPY WITH PROPOFOL;  Surgeon: Jackquline Denmark, MD;  Location: [facility];  Service: Endoscopy;  Laterality: N/A;   DRUG INDUCED ENDOSCOPY N/A 03/04/2020   Procedure: DRUG INDUCED ENDOSCOPY;  Surgeon: Melida Quitter, MD;  Location: [facility];  Service: ENT;  Laterality: N/A;   FRACTURE SURGERY  09/1964   floor of orbit right side   HIATAL HERNIA REPAIR     IMPLANTATION OF HYPOGLOSSAL NERVE STIMULATOR Right 04/15/2020   Procedure: IMPLANTATION OF HYPOGLOSSAL NERVE STIMULATOR;  Surgeon: Melida Quitter, MD;  Location: [facility];  Service: ENT;  Laterality: Right;   JOINT  REPLACEMENT     bilateral hip replacements   MELANOMA EXCISION  12/2011   POLYPECTOMY  01/17/2020   Procedure: POLYPECTOMY;  Surgeon: Jackquline Denmark, MD;  Location: [facility];  Service: Endoscopy;;   TOTAL HIP ARTHROPLASTY Left 06/17/2014   Procedure: LEFT TOTAL HIP ARTHROPLASTY ANTERIOR APPROACH;  Surgeon: Mcarthur Rossetti, MD;  Location: [facility];  Service: Orthopedics;  Laterality: Left;   TOTAL HIP ARTHROPLASTY Right 09/02/2014   Procedure: RIGHT TOTAL HIP ARTHROPLASTY ANTERIOR APPROACH;  Surgeon: Mcarthur Rossetti, MD;  Location: [facility];  Service: Orthopedics;  Laterality: Right;   VEIN SURGERY  05/2011   venous ablation    Patient Active Problem List   Diagnosis Date Noted   Macrocytosis 12/28/2021   Malignant neoplasm of upper-outer  quadrant of left breast in female, estrogen receptor positive ([place]) 01/07/2021   Vitamin D deficiency 12/01/2020   Intolerance of continuous positive airway pressure (CPAP) ventilation 05/13/2020   Severe obstructive sleep apnea-hypopnea syndrome 05/13/2020   Hx of colonic polyps    Adenomatous polyp of ascending colon    Genetic testing 06/24/2019   Family history of uterine cancer    Family history of colon cancer    History of uterine cancer    History of sebaceous adenoma    S/P repair of paraesophageal hernia 03/12/2019   Syncope 02/27/2018   Community acquired pneumonia 02/10/2018   CAP (community acquired pneumonia) 02/10/2018   Long term current use of antiarrhythmic drug 11/02/2015   Varicose veins 11/02/2015   OAB (overactive bladder) 09/07/2015   History of melanoma 06/10/2015   Thyroid nodule 06/10/2015   GERD (gastroesophageal reflux disease) 05/25/2015   Chronic venous insufficiency 05/25/2015   Hx of essential hypertension 05/25/2015   Atrial fibrillation ([place]) 04/22/2014   Osteoarthritis, hip, bilateral 03/25/2014   Lumbar and sacral osteoarthritis 12/10/2013    PCP: Isaac Bliss, Rayford Halsted, MD  REFERRING PROVIDER: Persons, Bevely Palmer, [place]  REFERRING DIAG: Chronic right shoulder pain [M25.511, G89.29]   THERAPY DIAG:  Muscle weakness (generalized)  Acute pain of right shoulder  Rationale for Evaluation and Treatment: Rehabilitation  ONSET DATE: October 1st 2023  SUBJECTIVE:                                                                                                                                                                                      SUBJECTIVE STATEMENT:  Pt arriving stating there is always pain first thing in the morning.   Eval:  Pt states that she has injured her shoulder 3 times over the span of 7 years. Pt was reaching behind her to grab waters that were very heavy when she felt pain  and head a lot of noises. She reports a sharp  pain, that has 80% resolved. Intermittent crepitus in her R shoulder, but denies any pain today.   PERTINENT HISTORY: Chronic edema, Clotting disorder, GERD, Herniated disc in cervical spine, Lumbar and sacral OA, severe sleep apnea, PVD, S/P THA, urinary incontinence, venous insufficiency, Uterine cancer, Scoliosis.   PAIN:  Are you having pain? 2/10 shoulder more tightness Worse: reaching overhead, lifting objects Relief:  resting, over the counter meds  PRECAUTIONS: Other: hx of a-fib and uterine and breast cancer.   WEIGHT BEARING RESTRICTIONS: No  FALLS:  Has patient fallen in last 6 months? Yes. Number of falls 1  LIVING ENVIRONMENT: Lives with: lives alone Lives in: House/apartment Stairs: No Has following equipment at home: None  OCCUPATION: Retired   PLOF: Independent  PATIENT GOALS:Pt would like to strengthen her R shoulder.   NEXT MD VISIT:   OBJECTIVE:   DIAGNOSTIC FINDINGS:  3 views of the right shoulder send no acute findings.  There is a small osteophyte off of the inferior humeral head but otherwise the shoulder is well located in the glenohumeral joint space is well-maintained as is the [facility] joint space.   PATIENT SURVEYS: FOTO 67.87%, 68% in 9 visits.   COGNITION: Overall cognitive status: Within functional limits for tasks assessed     SENSATION: WFL  POSTURE: Rounded shoulders.   UPPER EXTREMITY ROM:   Active ROM Right eval Left eval  Shoulder flexion [facility]  Shoulder abduction [facility]  Shoulder internal rotation [facility]  Shoulder external rotation WFL WFL  (Blank rows = not tested)  UPPER EXTREMITY MMT:  MMT Right eval Left eval  Shoulder flexion 4- 4+  Shoulder abduction 4- 4+  Shoulder internal rotation 4 4+  Shoulder external rotation 4- 4+  (Blank rows = not tested)  SHOULDER SPECIAL TESTS: SLAP lesions: Biceps load test: positive  Rotator cuff assessment: Internal rotation lag sign: negative and Belly press test:  positive  Biceps assessment: Speed's test: positive   JOINT MOBILITY TESTING:  Crepitus heard with MMT, anterior translation of R GHJ.   PALPATION:  No tenderness with palpation.    TODAY'S TREATMENT:                 DATE: 06/07/2022: UBE: Level 2 x  3 minutes each direction  Standing Rows with green TB x 20 holding 3 sec Rt shoulder ER and IR red TB x 20 Standing shoulder extension green TB x 20  Wall ladder x 5 scaption (level 28) Standing shoulder flexion c 2# bar x 15  Standing shoulder abd c 1 # bar x 15  Wall push ups x 10 Rolling yellow physio-ball on the wall shoulder flexed 90 deg rolling 15 x each direction     DATE: 05/31/2022: UBE 3 minutes each direction Standing Rows with green TB 2x10  Rt shoulder ER and IR red TB 2 x 10  Seated Extension with green TB 2x10  Standing shoulder extension 1 # bar 2 x 10 Wall ladder x 5 scaption (level 26) Horizontal abduction x 1 # x 10 Manual:  Grade 2-3 GH AP mobs STM using tennis ball Modalities:  Moist heat on Rt shoulder x 5 minutes  DATE: 05/26/2022: UBE 3 min/ 3 min bkwd, PT present for subjective. Scapular retraction x20, 3 sec hold Seated Shlrd Rows with green TB 2x10  Seated Shlrd Extension with green TB 2x10  Seated ER, bilat 2x10 with green TB  OH flexion with 1lb weights 2x10  Horizontal abduction with GTB 2x10                                                                                                                               PATIENT EDUCATION: Education details: Educated pt on anatomy and physiology of current symptoms, FOTO, diagnosis, prognosis, HEP,  and POC. Person educated: Patient Education method: Customer service manager Education comprehension: verbalized understanding and returned demonstration  HOME EXERCISE PROGRAM: Access Code: [email or web address] URL: [email or web address] Date:  05/16/2022 Prepared by: Rudi Heap  Exercises - Seated Scapular Retraction  - 2 x daily - 7 x weekly - 2 sets - 10 reps - Standing Shoulder Row with Anchored Resistance  - 2 x daily - 7 x weekly - 2 sets - 10 reps  ASSESSMENT:  CLINICAL IMPRESSION:  Pt tolerating exercises well with only reports of stiffness in bilateral shoulders. Treatment focusing on strengthening. Pt's Flexion improved based on wall ladder moving up 2 steps. Continue skilled PT interventions to maximize pt's function.     OBJECTIVE IMPAIRMENTS: decreased activity tolerance, decreased shoulder mobility, decreased ROM, decreased strength, impaired flexibility, impaired UE use, postural dysfunction, and pain.  ACTIVITY LIMITATIONS: reaching, lifting, carry,  cleaning, driving, and or occupation  PERSONAL FACTORS: Chronic edema, Clotting disorder, GERD, Herniated disc in cervical spine, Lumbar and sacral OA, severe sleep apnea, PVD, S/P THA, urinary incontinence, venous insufficiency, Uterine cancer, Scoliosis,  also affecting patient's functional outcome.  REHAB POTENTIAL: Good  CLINICAL DECISION MAKING: Stable/uncomplicated  EVALUATION COMPLEXITY: Low    GOALS: Short term PT Goals Target date: 06/21/2022 Pt will be I and compliant with HEP. Baseline:  Goal status: MET 06/07/22 Pt will decrease pain by 25% overall Baseline: Goal status: New  Long term PT goals Target date: 07/19/2022 Pt will improve Rt shoulder strength to at least 4+/5 MMT to improve functional strength Baseline: Goal status: New Pt will improve FOTO to at least 68% functional to show improved function Baseline: Goal status: New Pt will be compliant with advanced HEP.  Baseline: Goal status: New Pt will report no pain with OP with MMT into ER and Flex.  Baseline: Goal status: New  PLAN: PT FREQUENCY: 1x per week   PT DURATION: 6-8 weeks.   PLANNED INTERVENTIONS (unless contraindicated): aquatic PT, Canalith repositioning,  cryotherapy, Electrical stimulation, Iontophoresis with 4 mg/ml dexamethasome, Moist heat, traction, Ultrasound, gait training, Therapeutic exercise, balance training, neuromuscular re-education, patient/family education, prosthetic training, manual techniques, passive ROM, dry needling, taping, vasopnuematic device, vestibular, spinal manipulations, joint manipulations  PLAN FOR NEXT SESSION: Continue R RTC strengthening program, shoulder mobs, manual as needed    Kearney Hard, PT, MPT 06/07/22 2:28 PM  06/07/2022, 2:28 PM

## 2022-06-14 ENCOUNTER — Encounter: Payer: Self-pay | Admitting: Physical Therapy

## 2022-06-14 ENCOUNTER — Ambulatory Visit (INDEPENDENT_AMBULATORY_CARE_PROVIDER_SITE_OTHER): Payer: PPO | Admitting: Physical Therapy

## 2022-06-14 DIAGNOSIS — M25511 Pain in right shoulder: Secondary | ICD-10-CM

## 2022-06-14 DIAGNOSIS — M6281 Muscle weakness (generalized): Secondary | ICD-10-CM

## 2022-06-14 NOTE — Therapy (Signed)
OUTPATIENT PHYSICAL THERAPY SHOULDER TREATMENT   Patient Name: [name] MRN: [id number] DOB:08/25/1950, 72 y.o., female Today's Date: 06/14/2022  END OF SESSION:  PT End of Session - 06/14/22 0959     Visit Number 5    Number of Visits 6    Date for PT Re-Evaluation 07/11/22    Authorization Type [employer]    Progress Note Due on Visit 10    PT Start Time 0935    PT Stop Time 1015    PT Time Calculation (min) 40 min    Activity Tolerance Patient tolerated treatment well    Behavior During Therapy WFL for tasks assessed/performed               Past Medical History:  Diagnosis Date   Chronic edema    Clotting disorder    Constipation    Difficult intubation    pt states that her neck needs to be in a neutral position,  scoliosisand herniated dics in neck   Family history of colon cancer    Family history of uterine cancer    Gallstones    GERD (gastroesophageal reflux disease)    protonix, hx h. pylori   Herniated disc, cervical    History of hiatal hernia    History of sebaceous adenoma    History of uterine cancer 04/22/2014   sees Dr. Ronita Hipps; s/p complete hysterectomy   Hx of colonic polyp    Lumbar and sacral osteoarthritis 12/10/2013   Melanoma    sees Dr. Renda Rolls in dermatology right upper arm   Obesity    OSA on CPAP    uses CPAP   Paroxysmal atrial fibrillation    Peripheral vascular disease    PONV (postoperative nausea and vomiting)    Recurrent UTI    S/P hip replacement    Scoliosis    Sleep apnea    Thyroid nodule    Urinary incontinence    Uterine cancer    Stage 1   Venous insufficiency    s/p ablation   Past Surgical History:  Procedure Laterality Date   ABDOMINAL HYSTERECTOMY  09/10/2013   ATRIAL FIBRILLATION ABLATION N/A 08/09/2019   Procedure: ATRIAL FIBRILLATION ABLATION;  Surgeon: Constance Haw, MD;  Location: [facility];  Service: Cardiovascular;  Laterality: N/A;   BREAST LUMPECTOMY WITH  RADIOACTIVE SEED AND SENTINEL LYMPH NODE BIOPSY Left 02/16/2021   Procedure: LEFT BREAST LUMPECTOMY WITH RADIOACTIVE SEED X2 AND SENTINEL LYMPH NODE BIOPSY;  Surgeon: Erroll Luna, MD;  Location: [facility];  Service: General;  Laterality: Left;   BUNIONECTOMY  10/1976   CHOLECYSTECTOMY N/A 03/12/2019   Procedure: XI ROBOTIC ASSISTED CHOLECYSTECTOMY;  Surgeon: Clovis Riley, MD;  Location: [facility];  Service: General;  Laterality: N/A;   COLONOSCOPY     COLONOSCOPY WITH PROPOFOL N/A 01/17/2020   Procedure: COLONOSCOPY WITH PROPOFOL;  Surgeon: Jackquline Denmark, MD;  Location: [facility];  Service: Endoscopy;  Laterality: N/A;   DRUG INDUCED ENDOSCOPY N/A 03/04/2020   Procedure: DRUG INDUCED ENDOSCOPY;  Surgeon: Melida Quitter, MD;  Location: [facility];  Service: ENT;  Laterality: N/A;   FRACTURE SURGERY  09/1964   floor of orbit right side   HIATAL HERNIA REPAIR     IMPLANTATION OF HYPOGLOSSAL NERVE STIMULATOR Right 04/15/2020   Procedure: IMPLANTATION OF HYPOGLOSSAL NERVE STIMULATOR;  Surgeon: Melida Quitter, MD;  Location: [place];  Service: ENT;  Laterality: Right;   JOINT REPLACEMENT  bilateral hip replacements   MELANOMA EXCISION  12/2011   POLYPECTOMY  01/17/2020   Procedure: POLYPECTOMY;  Surgeon: Jackquline Denmark, MD;  Location: [facility];  Service: Endoscopy;;   TOTAL HIP ARTHROPLASTY Left 06/17/2014   Procedure: LEFT TOTAL HIP ARTHROPLASTY ANTERIOR APPROACH;  Surgeon: Mcarthur Rossetti, MD;  Location: [place];  Service: Orthopedics;  Laterality: Left;   TOTAL HIP ARTHROPLASTY Right 09/02/2014   Procedure: RIGHT TOTAL HIP ARTHROPLASTY ANTERIOR APPROACH;  Surgeon: Mcarthur Rossetti, MD;  Location: [place];  Service: Orthopedics;  Laterality: Right;   VEIN SURGERY  05/2011   venous ablation    Patient Active Problem List   Diagnosis Date Noted   Macrocytosis 12/28/2021   Malignant neoplasm of upper-outer quadrant of left breast in  female, estrogen receptor positive 01/07/2021   Vitamin D deficiency 12/01/2020   Intolerance of continuous positive airway pressure (CPAP) ventilation 05/13/2020   Severe obstructive sleep apnea-hypopnea syndrome 05/13/2020   Hx of colonic polyps    Adenomatous polyp of ascending colon    Genetic testing 06/24/2019   Family history of uterine cancer    Family history of colon cancer    History of uterine cancer    History of sebaceous adenoma    S/P repair of paraesophageal hernia 03/12/2019   Syncope 02/27/2018   Community acquired pneumonia 02/10/2018   CAP (community acquired pneumonia) 02/10/2018   Long term current use of antiarrhythmic drug 11/02/2015   Varicose veins 11/02/2015   OAB (overactive bladder) 09/07/2015   History of melanoma 06/10/2015   Thyroid nodule 06/10/2015   GERD (gastroesophageal reflux disease) 05/25/2015   Chronic venous insufficiency 05/25/2015   Hx of essential hypertension 05/25/2015   Atrial fibrillation 04/22/2014   Osteoarthritis, hip, bilateral 03/25/2014   Lumbar and sacral osteoarthritis 12/10/2013    PCP: Isaac Bliss, Rayford Halsted, MD  REFERRING PROVIDER: Persons, Bevely Palmer, [place]  REFERRING DIAG: Chronic right shoulder pain [M25.511, G89.29]   THERAPY DIAG:  Muscle weakness (generalized)  Acute pain of right shoulder  Rationale for Evaluation and Treatment: Rehabilitation  ONSET DATE: October 1st 2023  SUBJECTIVE:                                                                                                                                                                                      SUBJECTIVE STATEMENT:  Pt arriving stating she believed the abduction exercises caused her soreness for 2 days following her last visit.    Eval:  Pt states that she has injured her shoulder 3 times over the span of 7 years. Pt was reaching behind her to grab waters that were very heavy when she felt pain  and head a lot of noises. She reports  a sharp pain, that has 80% resolved. Intermittent crepitus in her R shoulder, but denies any pain today.   PERTINENT HISTORY: Chronic edema, Clotting disorder, GERD, Herniated disc in cervical spine, Lumbar and sacral OA, severe sleep apnea, PVD, S/P THA, urinary incontinence, venous insufficiency, Uterine cancer, Scoliosis.   PAIN:  Are you having pain? 2/10 Rt shoulder Worse: reaching overhead, lifting objects Relief:  resting, over the counter meds  PRECAUTIONS: Other: hx of a-fib and uterine and breast cancer.   WEIGHT BEARING RESTRICTIONS: No  FALLS:  Has patient fallen in last 6 months? Yes. Number of falls 1  LIVING ENVIRONMENT: Lives with: lives alone Lives in: House/apartment Stairs: No Has following equipment at home: None  OCCUPATION: Retired   PLOF: Independent  PATIENT GOALS:Pt would like to strengthen her R shoulder.   NEXT MD VISIT:   OBJECTIVE:   DIAGNOSTIC FINDINGS:  3 views of the right shoulder send no acute findings.  There is a small osteophyte off of the inferior humeral head but otherwise the shoulder is well located in the glenohumeral joint space is well-maintained as is the [facility] joint space.   PATIENT SURVEYS: FOTO 67.87%, 68% in 9 visits.   COGNITION: Overall cognitive status: Within functional limits for tasks assessed     SENSATION: WFL  POSTURE: Rounded shoulders.   UPPER EXTREMITY ROM:   Active ROM Right eval Left eval  Shoulder flexion [facility]  Shoulder abduction [facility]  Shoulder internal rotation [facility]  Shoulder external rotation WFL WFL  (Blank rows = not tested)  UPPER EXTREMITY MMT:  MMT Right eval Left eval  Shoulder flexion 4- 4+  Shoulder abduction 4- 4+  Shoulder internal rotation 4 4+  Shoulder external rotation 4- 4+  (Blank rows = not tested)  SHOULDER SPECIAL TESTS: SLAP lesions: Biceps load test: positive  Rotator cuff assessment: Internal rotation lag sign: negative and Belly press test: positive   Biceps assessment: Speed's test: positive   JOINT MOBILITY TESTING:  Crepitus heard with MMT, anterior translation of R GHJ.   PALPATION:  No tenderness with palpation.    TODAY'S TREATMENT:  DATE: 06/14/22: UBE: Level 2 x  3 minutes each direction  Standing Rows with green TB x 20 holding 3 sec Rt shoulder ER and IR red TB x 20 Standing shoulder extension green TB x 20  Wall ladder x 5 scaption (level 28) Seated shoulder flexion c 2# bar x 15  Seated bicep curls: 3# x 20 Seated shoulder isometrics using 2# bar x 10 holding 5 sec Rolling yellow physio-ball up and down the wall x 10                   DATE: 06/07/2022: UBE: Level 2 x  3 minutes each direction  Standing Rows with green TB x 20 holding 3 sec Rt shoulder ER and IR red TB x 20 Standing shoulder extension green TB x 20  Wall ladder x 5 scaption (level 28) Standing shoulder flexion c 2# bar x 15  Standing shoulder abd c 1 # bar x 15  Wall push ups x 10 Rolling yellow physio-ball on the wall shoulder flexed 90 deg rolling 15 x each direction  DATE: 05/26/2022: UBE 3 min/ 3 min bkwd, PT present for subjective. Scapular retraction x20, 3 sec hold Seated Shlrd Rows with green TB 2x10  Seated Shlrd Extension with green TB 2x10  Seated ER, bilat 2x10 with green TB  OH flexion with 1lb weights 2x10  Horizontal abduction with GTB 2x10                                                                                                                               PATIENT EDUCATION: Education details: Educated pt on anatomy and physiology of current symptoms, FOTO, diagnosis, prognosis, HEP,  and POC. Person educated: Patient Education method: Customer service manager Education comprehension: verbalized understanding and returned demonstration  HOME EXERCISE PROGRAM: Access Code: [email or web address] URL: [email or web address] Date:  05/16/2022 Prepared by: Rudi Heap  Exercises - Seated Scapular Retraction  - 2 x daily - 7 x weekly - 2 sets - 10 reps - Standing Shoulder Row with Anchored Resistance  - 2 x daily - 7 x weekly - 2 sets - 10 reps  ASSESSMENT:  CLINICAL IMPRESSION:  Pt performing more sitting exercises today due to pt reporting her thighs were "burning". Pt tolerating all shoulder exercises well. We eliminated shoulder abd, due to pt reporting increased soreness following our last session. Pt reporting 2/10 pain in her Rt shoulder upon arrival and less tightness at end of session. Pt did report she wasn't using as much heat or modalities at night before bed. Continue with ROM and strengthening exercises.     OBJECTIVE IMPAIRMENTS: decreased activity tolerance, decreased shoulder mobility, decreased ROM, decreased strength, impaired flexibility, impaired UE use, postural dysfunction, and pain.  ACTIVITY LIMITATIONS: reaching, lifting, carry,  cleaning, driving, and or occupation  PERSONAL FACTORS: Chronic edema, Clotting disorder, GERD, Herniated disc in cervical spine, Lumbar and sacral OA, severe sleep apnea, PVD, S/P THA, urinary incontinence, venous insufficiency, Uterine cancer, Scoliosis,  also affecting patient's functional outcome.  REHAB POTENTIAL: Good  CLINICAL DECISION MAKING: Stable/uncomplicated  EVALUATION COMPLEXITY: Low    GOALS: Short term PT Goals Target date: 06/28/2022 Pt will be I and compliant with HEP. Baseline:  Goal status: MET 06/07/22 Pt will decrease pain by 25% overall Baseline: Goal status: On-going 06/14/22  Long term PT goals Target date: 07/26/2022 Pt will improve Rt shoulder strength to at least 4+/5 MMT to improve functional strength Baseline: Goal status: New Pt will improve FOTO to at least 68% functional to show improved function Baseline: Goal status: New Pt will be compliant with advanced HEP.  Baseline: Goal status: New Pt will report no pain with OP  with MMT into ER and Flex.  Baseline: Goal status: New  PLAN: PT FREQUENCY: 1x per week   PT DURATION: 6-8 weeks.   PLANNED INTERVENTIONS (unless contraindicated): aquatic PT, Canalith repositioning, cryotherapy, Electrical stimulation, Iontophoresis with 4 mg/ml dexamethasome, Moist heat, traction, Ultrasound, gait training, Therapeutic exercise, balance training, neuromuscular re-education, patient/family education, prosthetic training, manual techniques, passive  ROM, dry needling, taping, vasopnuematic device, vestibular, spinal manipulations, joint manipulations  PLAN FOR NEXT SESSION: Continue R RTC strengthening program, shoulder mobs, manual as needed    Kearney Hard, PT, MPT 06/14/22 10:01 AM   06/14/2022, 10:01 AM

## 2022-06-21 ENCOUNTER — Ambulatory Visit: Payer: PPO | Admitting: Physical Therapy

## 2022-06-21 ENCOUNTER — Encounter: Payer: Self-pay | Admitting: Physical Therapy

## 2022-06-21 DIAGNOSIS — M6281 Muscle weakness (generalized): Secondary | ICD-10-CM | POA: Diagnosis not present

## 2022-06-21 DIAGNOSIS — M25511 Pain in right shoulder: Secondary | ICD-10-CM | POA: Diagnosis not present

## 2022-06-21 NOTE — Therapy (Signed)
OUTPATIENT PHYSICAL THERAPY SHOULDER TREATMENT DISCHARGE   Patient Name: [name] MRN: [id number] DOB:January 03, 1951, 72 y.o., female Today's Date: 06/21/2022  END OF SESSION:  PT End of Session - 06/21/22 0856     Visit Number 6    Number of Visits 6    Date for PT Re-Evaluation 07/11/22    Authorization Type [employer]    Progress Note Due on Visit 10    PT Start Time 0846    PT Stop Time 0925    PT Time Calculation (min) 39 min    Activity Tolerance Patient tolerated treatment well    Behavior During Therapy WFL for tasks assessed/performed                Past Medical History:  Diagnosis Date   Chronic edema    Clotting disorder    Constipation    Difficult intubation    pt states that her neck needs to be in a neutral position,  scoliosisand herniated dics in neck   Family history of colon cancer    Family history of uterine cancer    Gallstones    GERD (gastroesophageal reflux disease)    protonix, hx h. pylori   Herniated disc, cervical    History of hiatal hernia    History of sebaceous adenoma    History of uterine cancer 04/22/2014   sees Dr. Billy Coast; s/p complete hysterectomy   Hx of colonic polyp    Lumbar and sacral osteoarthritis 12/10/2013   Melanoma    sees Dr. Sharyn Lull in dermatology right upper arm   Obesity    OSA on CPAP    uses CPAP   Paroxysmal atrial fibrillation    Peripheral vascular disease    PONV (postoperative nausea and vomiting)    Recurrent UTI    S/P hip replacement    Scoliosis    Sleep apnea    Thyroid nodule    Urinary incontinence    Uterine cancer    Stage 1   Venous insufficiency    s/p ablation   Past Surgical History:  Procedure Laterality Date   ABDOMINAL HYSTERECTOMY  09/10/2013   ATRIAL FIBRILLATION ABLATION N/A 08/09/2019   Procedure: ATRIAL FIBRILLATION ABLATION;  Surgeon: Regan Lemming, MD;  [facility];  Service: Cardiovascular;  Laterality: N/A;   BREAST LUMPECTOMY  WITH RADIOACTIVE SEED AND SENTINEL LYMPH NODE BIOPSY Left 02/16/2021   Procedure: LEFT BREAST LUMPECTOMY WITH RADIOACTIVE SEED X2 AND SENTINEL LYMPH NODE BIOPSY;  Surgeon: Harriette Bouillon, MD;  Location: [facility];  Service: General;  Laterality: Left;   BUNIONECTOMY  10/1976   CHOLECYSTECTOMY N/A 03/12/2019   Procedure: XI ROBOTIC ASSISTED CHOLECYSTECTOMY;  Surgeon: Berna Bue, MD;  Location: [facility];  Service: General;  Laterality: N/A;   COLONOSCOPY     COLONOSCOPY WITH PROPOFOL N/A 01/17/2020   Procedure: COLONOSCOPY WITH PROPOFOL;  Surgeon: Lynann Bologna, MD;  Location: [facility];  Service: Endoscopy;  Laterality: N/A;   DRUG INDUCED ENDOSCOPY N/A 03/04/2020   Procedure: DRUG INDUCED ENDOSCOPY;  Surgeon: Christia Reading, MD;  Location: [facility];  Service: ENT;  Laterality: N/A;   FRACTURE SURGERY  09/1964   floor of orbit right side   HIATAL HERNIA REPAIR     IMPLANTATION OF HYPOGLOSSAL NERVE STIMULATOR Right 04/15/2020   Procedure: IMPLANTATION OF HYPOGLOSSAL NERVE STIMULATOR;  Surgeon: Christia Reading, MD;  Location: [facility];  Service: ENT;  Laterality: Right;   JOINT REPLACEMENT  bilateral hip replacements   MELANOMA EXCISION  12/2011   POLYPECTOMY  01/17/2020   Procedure: POLYPECTOMY;  Surgeon: Lynann Bologna, MD;  [facility]: [facility];  Service: Endoscopy;;   TOTAL HIP ARTHROPLASTY Left 06/17/2014   Procedure: LEFT TOTAL HIP ARTHROPLASTY ANTERIOR APPROACH;  Surgeon: Kathryne Hitch, MD;  Location: [facility];  Service: Orthopedics;  Laterality: Left;   TOTAL HIP ARTHROPLASTY Right 09/02/2014   Procedure: RIGHT TOTAL HIP ARTHROPLASTY ANTERIOR APPROACH;  Surgeon: Kathryne Hitch, MD;  [facility]: [facility];  Service: Orthopedics;  Laterality: Right;   VEIN SURGERY  05/2011   venous ablation    Patient Active Problem List   Diagnosis Date Noted   Macrocytosis 12/28/2021   Malignant neoplasm of upper-outer quadrant of left breast  in female, estrogen receptor positive 01/07/2021   Vitamin D deficiency 12/01/2020   Intolerance of continuous positive airway pressure (CPAP) ventilation 05/13/2020   Severe obstructive sleep apnea-hypopnea syndrome 05/13/2020   Hx of colonic polyps    Adenomatous polyp of ascending colon    Genetic testing 06/24/2019   Family history of uterine cancer    Family history of colon cancer    History of uterine cancer    History of sebaceous adenoma    S/P repair of paraesophageal hernia 03/12/2019   Syncope 02/27/2018   Community acquired pneumonia 02/10/2018   CAP (community acquired pneumonia) 02/10/2018   Long term current use of antiarrhythmic drug 11/02/2015   Varicose veins 11/02/2015   OAB (overactive bladder) 09/07/2015   History of melanoma 06/10/2015   Thyroid nodule 06/10/2015   GERD (gastroesophageal reflux disease) 05/25/2015   Chronic venous insufficiency 05/25/2015   Hx of essential hypertension 05/25/2015   Atrial fibrillation 04/22/2014   Osteoarthritis, hip, bilateral 03/25/2014   Lumbar and sacral osteoarthritis 12/10/2013    PCP: Philip Aspen, Limmie Patricia, MD  REFERRING PROVIDER: Persons, [place], [place]  REFERRING DIAG: Chronic right shoulder pain [M25.511, G89.29]   THERAPY DIAG:  Muscle weakness (generalized)  Acute pain of right shoulder  Rationale for Evaluation and Treatment: Rehabilitation  ONSET DATE: October 1st 2023  SUBJECTIVE:                                                                                                                                                                                      SUBJECTIVE STATEMENT:  Pt arriving today reporting feel "pretty good".    Eval:  Pt states that she has injured her shoulder 3 times over the span of 7 years. Pt was reaching behind her to grab waters that were very heavy when she felt pain and head a lot of noises. She reports a sharp pain,  that has 80% resolved. Intermittent crepitus in  her R shoulder, but denies any pain today.   PERTINENT HISTORY: Chronic edema, Clotting disorder, GERD, Herniated disc in cervical spine, Lumbar and sacral OA, severe sleep apnea, PVD, S/P THA, urinary incontinence, venous insufficiency, Uterine cancer, Scoliosis.   PAIN:  Are you having pain? 1-2/10 Rt shoulder Worse: reaching overhead, lifting objects Relief:  resting, over the counter meds  PRECAUTIONS: Other: hx of a-fib and uterine and breast cancer.   WEIGHT BEARING RESTRICTIONS: No  FALLS:  Has patient fallen in last 6 months? Yes. Number of falls 1  LIVING ENVIRONMENT: Lives with: lives alone Lives in: House/apartment Stairs: No Has following equipment at home: None  OCCUPATION: Retired   PLOF: Independent  PATIENT GOALS:Pt would like to strengthen her R shoulder.   NEXT MD VISIT:   OBJECTIVE:   DIAGNOSTIC FINDINGS:  3 views of the right shoulder send no acute findings.  There is a small osteophyte off of the inferior humeral head but otherwise the shoulder is well located in the glenohumeral joint space is well-maintained as is the [facility].   PATIENT SURVEYS: FOTO 67.87%, 68% in 9 visits.    COGNITION: Overall cognitive status: Within functional limits for tasks assessed     SENSATION: WFL  POSTURE: Rounded shoulders.   UPPER EXTREMITY ROM:   Active ROM Right eval Left eval Rt / Left  06/21/22  Shoulder flexion [facility]   Shoulder abduction [facility]  Shoulder internal [facility]  Shoulder external rotation [facility] WFL WFL  (Blank rows = not tested)  UPPER EXTREMITY MMT:  MMT Right eval Left eval Rt / Left 06/21/22  Shoulder flexion 4- 4+ 5 / 5   Shoulder abduction 4- 4+ 5 / 5   Shoulder internal rotation 4 4+ 5 / 5   Shoulder external rotation 4- 4+ 5 / 5   (Blank rows = not tested)  SHOULDER SPECIAL TESTS: SLAP lesions: Biceps load test: positive  Rotator cuff assessment: Internal rotation lag sign: negative and  Belly press test: positive  Biceps assessment: Speed's test: positive   JOINT MOBILITY TESTING:  Crepitus heard with MMT, anterior translation of R GHJ.   PALPATION:  No tenderness with palpation.    TODAY'S TREATMENT:  DATE: 06/21/22: Nustep: Level 6 x 6 minutes Standing Rows with green TB x 20 holding 3 sec Standing shoulder extension green TB x 20  Reviewed entire HEP and updated    DATE: 06/14/22: UBE: Level 2 x  3 minutes each direction  Standing Rows with green TB x 20 holding 3 sec Rt shoulder ER and IR red TB x 20 Standing shoulder extension green TB x 20  Wall ladder x 5 scaption (level 28) Seated shoulder flexion c 2# bar x 15  Seated bicep curls: 3# x 20 Seated shoulder isometrics using 2# bar x 10 holding 5 sec Rolling yellow physio-ball up and down the wall x 10                   DATE: 06/07/2022: UBE: Level 2 x  3 minutes each direction  Standing Rows with green TB x 20 holding 3 sec Rt shoulder ER and IR red TB x 20 Standing shoulder extension green TB x 20  Wall ladder x 5 scaption (level 28) Standing shoulder flexion c 2# bar x 15  Standing shoulder abd c 1 # bar x 15  Wall push ups x 10 Rolling yellow  physio-ball on the wall shoulder flexed 90 deg rolling 15 x each direction                                                                                                                                                                                                  PATIENT EDUCATION: Education details: Educated pt on anatomy and physiology of current symptoms, FOTO, diagnosis, prognosis, HEP,  and POC. Person educated: Patient Education method: Medical illustrator Education comprehension: verbalized understanding and returned demonstration  HOME EXERCISE PROGRAM: Access Code: [email or web address] URL: [email or web address] Date: 06/21/2022 Prepared by: Narda Amber  Exercises - Seated Scapular Retraction  - 2 x daily - 7 x weekly  - 2 sets - 10 reps - Standing Shoulder Row with Anchored Resistance  - 2 x daily - 7 x weekly - 2 sets - 10 reps - Seated Cervical Retraction  - 1 x daily - 7 x weekly - 5 reps - 5 seconds hold - Shoulder extension with resistance - Neutral  - 1 x daily - 7 x weekly - 2 sets - 10 reps - Gentle Levator Scapulae Stretch  - 1 x daily - 7 x weekly - 5 reps - 5 seconds hold  ASSESSMENT:  CLINICAL IMPRESSION:  Pt has improved her bilateral UE strength to 5/5. Pt ROM is WFL. Pt feels she is ready to progress to home/gym based program. Pt is being discharged from skilled PT.     OBJECTIVE IMPAIRMENTS: decreased activity tolerance, decreased shoulder mobility, decreased ROM, decreased strength, impaired flexibility, impaired UE use, postural dysfunction, and pain.  ACTIVITY LIMITATIONS: reaching, lifting, carry,  cleaning, driving, and or occupation  PERSONAL FACTORS: Chronic edema, Clotting disorder, GERD, Herniated disc in cervical spine, Lumbar and sacral OA, severe sleep apnea, PVD, S/P THA, urinary incontinence, venous insufficiency, Uterine cancer, Scoliosis,  also affecting patient's functional outcome.  REHAB POTENTIAL: Good  CLINICAL DECISION MAKING: Stable/uncomplicated  EVALUATION COMPLEXITY: Low    GOALS: Short term PT Goals Target date: 07/05/2022 Pt will be I and compliant with HEP. Baseline:  Goal status: MET 06/07/22 Pt will decrease pain by 25% overall Baseline: Goal status: MET 06/21/22  Long term PT goals Target date: 08/02/2022 Pt will improve Rt shoulder strength to at least 4+/5 MMT to improve functional strength Baseline: Goal status: MET 06/21/22 Pt will improve FOTO to at least 68% functional to show improved function Baseline: Goal status: Not Met 06/21/22 Pt will be compliant with advanced HEP.  Baseline: Goal status: MET 06/21/22 Pt will report no pain with OP with MMT into ER and Flex.  Baseline: Goal status:  MET 06/21/22  PLAN: PT FREQUENCY: 1x per week    PT DURATION: 6-8 weeks.   PLANNED INTERVENTIONS (unless contraindicated): aquatic PT, Canalith repositioning, cryotherapy, Electrical stimulation, Iontophoresis with 4 mg/ml dexamethasome, Moist heat, traction, Ultrasound, gait training, Therapeutic exercise, balance training, neuromuscular re-education, patient/family education, prosthetic training, manual techniques, passive ROM, dry needling, taping, vasopnuematic device, vestibular, spinal manipulations, joint manipulations  PLAN FOR NEXT SESSION: Discharge    Narda Amber, PT, MPT 06/21/22 12:34 PM  PHYSICAL THERAPY DISCHARGE SUMMARY  Visits from Start of Care: 6  Current functional level related to goals / functional outcomes: See above   Remaining deficits: See above   Education / Equipment: HEP   Patient agrees to discharge. Patient goals were partially met. Patient is being discharged due to being pleased with the current functional level.   06/21/2022, 12:34 PM

## 2022-06-27 ENCOUNTER — Other Ambulatory Visit: Payer: Self-pay | Admitting: Cardiology

## 2022-08-01 ENCOUNTER — Encounter: Payer: Self-pay | Admitting: Physician Assistant

## 2022-08-01 ENCOUNTER — Ambulatory Visit (INDEPENDENT_AMBULATORY_CARE_PROVIDER_SITE_OTHER): Payer: PPO | Admitting: Physician Assistant

## 2022-08-01 DIAGNOSIS — G8929 Other chronic pain: Secondary | ICD-10-CM

## 2022-08-01 DIAGNOSIS — M25511 Pain in right shoulder: Secondary | ICD-10-CM

## 2022-08-01 NOTE — Progress Notes (Addendum)
Office Visit Note   Patient: [name]           Date of Birth: Mar 30, 1950           MRN: [id number] Visit Date: 08/01/2022              Requested by: Philip Aspen, Limmie Patricia, MD [address] [place],  [place] [address] PCP: Philip Aspen, Limmie Patricia, MD   Assessment & Plan: Visit Diagnoses:  1. Chronic right shoulder pain     Plan: Given her continued pain in her shoulder despite conservative measures we will have her see Dr. Shon Baton so that he can ultrasound her right shoulder rule out rotator cuff tear.  Potentially steroid injection in her shoulder at that time.  Did discuss with her that she needed to take Tylenol instead of ibuprofen given the fact that she is on Eliquis.  Will see her back in approximately 2 weeks to see how she is doing overall.  Questions were encouraged and answered at length  Follow-Up Instructions: Return in about 2 weeks (around 08/15/2022).   Orders:  No orders of the defined types were placed in this encounter.  No orders of the defined types were placed in this encounter.     Procedures: No procedures performed   Clinical Data: No additional findings.   Subjective: Chief Complaint  Patient presents with   Right Shoulder - Pain    HPI Patient 72 year old female who comes in today for right shoulder pain.  She was last seen by Dr. Raye Sorrow on 03/21/2022 and given a cortisone injection in her shoulder.  She states she really did not get much relief.  She has tried therapy for her shoulder.  She continues to take ibuprofen for shoulder pain 2 to 3 tablets 2-3 times daily.  She is on chronic Eliquis.  She is nondiabetic.  Denies any fevers chills.  She also reports that she continues to do her home exercise program and states that it helps minimally with her shoulder pain.  Denies any radicular symptoms down the arm.  She does have some neck pain.  CT scan cervical spine dated 05/04/2022 are reviewed.  Images showed slight anterior  subluxation at C4-C5 felt to be degenerative.  Diffuse degenerative changes throughout cervical spine.  Review of Systems See HPI otherwise negative  Objective: Vital Signs: There were no vitals taken for this visit.  Physical Exam General: Well-developed well-nourished female no acute distress.  Affect appropriate Ortho Exam Bilateral shoulders she has full overhead motion of both shoulders actively.  She has weakness with external rotation of the right shoulder against resistance.  Empty can test reveals weakness on the right negative on the left.  Right shoulder weakness with liftoff test full strength on the left. Specialty Comments:  No specialty comments available.  Imaging: No results found.   PMFS History: Patient Active Problem List   Diagnosis Date Noted   Macrocytosis 12/28/2021   Malignant neoplasm of upper-outer quadrant of left breast in female, estrogen receptor positive (HCC) 01/07/2021   Vitamin D deficiency 12/01/2020   Intolerance of continuous positive airway pressure (CPAP) ventilation 05/13/2020   Severe obstructive sleep apnea-hypopnea syndrome 05/13/2020   Hx of colonic polyps    Adenomatous polyp of ascending colon    Genetic testing 06/24/2019   Family history of uterine cancer    Family history of colon cancer    History of uterine cancer    History of sebaceous adenoma  S/P repair of paraesophageal hernia 03/12/2019   Syncope 02/27/2018   Community acquired pneumonia 02/10/2018   CAP (community acquired pneumonia) 02/10/2018   Long term current use of antiarrhythmic drug 11/02/2015   Varicose veins 11/02/2015   OAB (overactive bladder) 09/07/2015   History of melanoma 06/10/2015   Thyroid nodule 06/10/2015   GERD (gastroesophageal reflux disease) 05/25/2015   Chronic venous insufficiency 05/25/2015   Hx of essential hypertension 05/25/2015   Atrial fibrillation (HCC) 04/22/2014   Osteoarthritis, hip, bilateral 03/25/2014   Lumbar and  sacral osteoarthritis 12/10/2013   Past Medical History:  Diagnosis Date   Chronic edema    Clotting disorder (HCC)    Constipation    Difficult intubation    pt states that her neck needs to be in a neutral position,  scoliosisand herniated dics in neck   Family history of colon cancer    Family history of uterine cancer    Gallstones    GERD (gastroesophageal reflux disease)    protonix, hx h. pylori   Herniated disc, cervical    History of hiatal hernia    History of sebaceous adenoma    History of uterine cancer 04/22/2014   sees Dr. 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 Sharyn Lull in dermatology right upper arm   Obesity    OSA on CPAP    uses CPAP   Paroxysmal atrial fibrillation (HCC)    Peripheral vascular disease (HCC)    PONV (postoperative nausea and vomiting)    Recurrent UTI    S/P hip replacement    Scoliosis    Sleep apnea    Thyroid nodule    Urinary incontinence    Uterine cancer (HCC)    Stage 1   Venous insufficiency    s/p ablation    Family History  Problem Relation Age of Onset   Uterine cancer Mother    Emphysema Mother    Diabetes Brother 50   Hypertension Father 31   Hyperlipidemia Father    Epilepsy Father 81   Colon cancer Father 64   Leukemia Father    Arthritis Sister 57   Cancer Maternal Grandmother        unk type possibly breast or skin   Other Paternal Grandmother        influenza    Stroke Paternal Grandfather    Cancer Paternal Uncle        unk type   Skin cancer Daughter        SCC on back   Esophageal cancer Neg Hx    Rectal cancer Neg Hx    Stomach cancer Neg Hx     Past Surgical History:  Procedure Laterality Date   ABDOMINAL HYSTERECTOMY  09/10/2013   ATRIAL FIBRILLATION ABLATION N/A 08/09/2019   Procedure: ATRIAL FIBRILLATION ABLATION;  Surgeon: Regan Lemming, MD;  [facility];  Service:  Cardiovascular;  Laterality: N/A;   BREAST LUMPECTOMY WITH RADIOACTIVE SEED AND SENTINEL LYMPH NODE BIOPSY Left 02/16/2021   Procedure: LEFT BREAST LUMPECTOMY WITH RADIOACTIVE SEED X2 AND SENTINEL LYMPH NODE BIOPSY;  Surgeon: Harriette Bouillon, MD;  Location: [facility];  Service: General;  Laterality: Left;   BUNIONECTOMY  10/1976   CHOLECYSTECTOMY N/A 03/12/2019   Procedure: XI ROBOTIC ASSISTED CHOLECYSTECTOMY;  Surgeon: Berna Bue, MD;  Location: [facility];  Service: General;  Laterality: N/A;  COLONOSCOPY     COLONOSCOPY WITH PROPOFOL N/A 01/17/2020   Procedure: COLONOSCOPY WITH PROPOFOL;  Surgeon: Lynann Bologna, MD;  [facility];  Service: Endoscopy;  Laterality: N/A;   DRUG INDUCED ENDOSCOPY N/A 03/04/2020   Procedure: DRUG INDUCED ENDOSCOPY;  Surgeon: Christia Reading, MD;  Location: [facility];  Service: ENT;  Laterality: N/A;   FRACTURE SURGERY  09/1964   floor of orbit right side   HIATAL HERNIA REPAIR     IMPLANTATION OF HYPOGLOSSAL NERVE STIMULATOR Right 04/15/2020   Procedure: IMPLANTATION OF HYPOGLOSSAL NERVE STIMULATOR;  Surgeon: Christia Reading, MD;  Location: [facility];  Service: ENT;  Laterality: Right;   JOINT REPLACEMENT     bilateral hip replacements   MELANOMA EXCISION  12/2011   POLYPECTOMY  01/17/2020   Procedure: POLYPECTOMY;  Surgeon: Lynann Bologna, MD;  [facility];  Service: Endoscopy;;   TOTAL HIP ARTHROPLASTY Left 06/17/2014   Procedure: LEFT TOTAL HIP ARTHROPLASTY ANTERIOR APPROACH;  Surgeon: Kathryne Hitch, MD;  [facility];  Service: Orthopedics;  Laterality: Left;   TOTAL HIP ARTHROPLASTY Right 09/02/2014   Procedure: RIGHT TOTAL HIP ARTHROPLASTY ANTERIOR APPROACH;  Surgeon: Kathryne Hitch, MD;  [facility];  Service: Orthopedics;  Laterality: Right;   VEIN SURGERY  05/2011   venous ablation    Social History   Occupational History   Occupation: lawyer/RETIRED  Tobacco Use    Smoking status: Never   Smokeless tobacco: Never  Vaping Use   Vaping Use: Never used  Substance and Sexual Activity   Alcohol use: Not Currently    Comment: rarely at [facility]    Drug use: No   Sexual activity: Not Currently    Birth control/protection: Surgical

## 2022-08-04 ENCOUNTER — Encounter: Payer: Self-pay | Admitting: Sports Medicine

## 2022-08-04 ENCOUNTER — Other Ambulatory Visit (INDEPENDENT_AMBULATORY_CARE_PROVIDER_SITE_OTHER): Payer: PPO

## 2022-08-04 ENCOUNTER — Ambulatory Visit (INDEPENDENT_AMBULATORY_CARE_PROVIDER_SITE_OTHER): Payer: PPO | Admitting: Sports Medicine

## 2022-08-04 DIAGNOSIS — M25511 Pain in right shoulder: Secondary | ICD-10-CM

## 2022-08-04 DIAGNOSIS — M75101 Unspecified rotator cuff tear or rupture of right shoulder, not specified as traumatic: Secondary | ICD-10-CM | POA: Diagnosis not present

## 2022-08-04 DIAGNOSIS — G8929 Other chronic pain: Secondary | ICD-10-CM | POA: Diagnosis not present

## 2022-08-04 DIAGNOSIS — S46811A Strain of other muscles, fascia and tendons at shoulder and upper arm level, right arm, initial encounter: Secondary | ICD-10-CM | POA: Diagnosis not present

## 2022-08-04 MED ORDER — LIDOCAINE HCL 1 % IJ SOLN
2.0000 mL | INTRAMUSCULAR | Status: AC | PRN
  Administered 2022-08-04: 2 mL

## 2022-08-04 MED ORDER — METHYLPREDNISOLONE ACETATE 40 MG/ML IJ SUSP
40.0000 mg | INTRAMUSCULAR | Status: AC | PRN
  Administered 2022-08-04: 40 mg via INTRA_ARTICULAR

## 2022-08-04 MED ORDER — BUPIVACAINE HCL 0.25 % IJ SOLN
2.0000 mL | INTRAMUSCULAR | Status: AC | PRN
  Administered 2022-08-04: 2 mL via INTRA_ARTICULAR

## 2022-08-04 NOTE — Progress Notes (Signed)
[name] - 72 y.o. female MRN [id number]  Date of birth: Jun 08, 1950  Office Visit Note: Visit Date: 08/04/2022 PCP: Philip Aspen, Limmie Patricia, MD Referred by: Philip Aspen, Estel*  Subjective: Chief Complaint  Patient presents with   Right Shoulder - Pain   HPI: [name] is a pleasant 72 y.o. female who presents today for follow-up of chronic right shoulder pain.  She has been seeing my partner, Dr. Magnus Ivan and Rexene Edison for the right shoulder.  He did have an injection very early in the year which provided her with excellent relief for a month or so although her pain has returned.  She has done formalized physical therapy and home exercises.  Her pain is bad enough now that it is keeping her from sleeping at nighttime and simply painful with certain reaching motions.  Pain is over the posterior lateral aspect of the shoulder.  Taking Tylenol as needed.  She is on Eliquis.  Patient does have Inspire device for her OSA, does think that she could likely have an MRI but would need to clarify with the makers of this prior to having this performed for clearance.  Pertinent ROS were reviewed with the patient and found to be negative unless otherwise specified above in HPI.   Assessment & Plan: Visit Diagnoses:  1. Tear of right supraspinatus tendon   2. Tear of right infraspinatus tendon, initial encounter   3. Chronic right shoulder pain    Plan: Discussed with [name] based on her exam and her ultrasound today, I do think she has full-thickness tearing of the supraspinatus and certainly high-grade tearing of the infraspinatus as well.  She also had weakness with resisted external rotation on her exam which is supporting what we saw on ultrasound.  She is not keen on treating this surgically and would like to continue conservative management if possible.  Through shared decision-making, did elect to proceed with subacromial joint injection to help control her pain.  After 72  hours of modified activity, she will resume her home rotator cuff therapy daily, I would like her to be very consistent with this for about 6 weeks and then have her re-present at that time to reevaluate.  If she is still having notable pain or significant weakness, we may want to obtain an MRI to better evaluate.  Will ultimately get her back to Dr. Magnus Ivan and Rexene Edison for further discussion.  Additional treatment considerations: Nitroglycerin patch protocol, extracorporeal shockwave therapy Additional workup consider: MRI of the shoulder  Follow-up: Return in about 6 weeks (around 09/15/2022).   Meds & Orders: No orders of the defined types were placed in this encounter.   Orders Placed This Encounter  Procedures   Large Joint Inj   [place] Extrem Up Right Comp     Procedures: Large Joint Inj: R subacromial bursa on 08/04/2022 12:25 PM Indications: pain Details: 22 G 1.5 in needle, posterior approach Medications: 2 mL lidocaine 1 %; 2 mL bupivacaine 0.25 %; 40 mg methylPREDNISolone acetate 40 MG/ML Outcome: tolerated well, no immediate complications  Subacromial Joint Injection, Right Shoulder After discussion on risks/benefits/indications, informed verbal consent was obtained. A timeout was then performed. Patient was seated on table in exam room. The patient's shoulder was prepped with betadine and alcohol swabs and utilizing posterior approach a 22G, 1.5" needle was directed anteriorly and laterally into the patient's subacromial space was injected with 2:2:1 mixture of lidocaine:bupivicaine:depomedrol with appreciation of free-flowing of the injectate into the bursal  space. Patient tolerated the procedure well without immediate complications.   Procedure, treatment alternatives, risks and benefits explained, specific risks discussed. Consent was given by the patient. Immediately prior to procedure a time out was called to verify the correct patient, procedure, equipment, support staff and  site/side marked as required. Patient was prepped and draped in the usual sterile fashion.          Clinical History: No specialty comments available.  She reports that she has never smoked. She has never used smokeless tobacco. No results for input(s): "HGBA1C", "LABURIC" in the last 8760 hours.  Objective:    Physical Exam  Gen: Well-appearing, in no acute distress; non-toxic CV: Well-perfused. Warm.  Resp: Breathing unlabored on room air; no wheezing. Psych: Fluid speech in conversation; appropriate affect; normal thought process Neuro: Sensation intact throughout. No gross coordination deficits.   Ortho Exam - Right shoulder: There is full active and passive range of motion, some hesitancy with abduction.  Positive pain at Codman's point and posteriorly over the course of the infraspinatus tendon.  There is associated weakness with resisted external rotation as well as pain and some mild weakness with resisted empty can testing.  No mechanical blocks to internal or external rotation.  Imaging: [place] Extrem Up Right Comp  Result Date: 08/04/2022 [facility] Complete US of Shoulder, Right Patient was seated on exam table and shoulder US examination was performed using high frequency linear probe. -Biceps tendon was seen within the bicipital groove and long in terms with access with mild degenerative changes but no high-grade tearing. -Subscapularis tendon was visualized with insertion on the lesser tubercle without evidence of high-grade tearing, mild tendinopathic changes. -The supraspinatus was visualized in longitudinal and transverse views, there is calcification noted at the mid and distal insertion.  The critical zone.  There is also cortical irregularity noted here suggestive of prior injury.  When spending the first [place] there is an area of hypoechoic change that is consistent with full-thickness tearing, this was measured with about 1.6 cm of retraction. -The infraspinatus tendon was  visualized posteriorly with evidence of tearing with hypoechoic change and calcification within the critical zone as a approach used the insertion near the supraspinatus.  Difficult to ascertain the degree of tearing but certainly seems to be high-grade in nature.  Teres minor was visualized without notable pathology. -AC joint with moderate arthritic change without bursal distention.  Posterior glenohumeral joint seen without effusion, degenerative labrum noted.   Full-thickness tear of the supraspinatus with likely retraction, high-grade tearing of the infraspinatus tendon near the critical zone overlying supraspinatus, would be better evaluated to visualize the degree of tearing with advanced imaging such as MRI.          Past Medical/Family/Surgical/Social History: Medications & Allergies reviewed per EMR, new medications updated. Patient Active Problem List   Diagnosis Date Noted   Macrocytosis 12/28/2021   Malignant neoplasm of upper-outer quadrant of left breast in female, estrogen receptor positive (HCC) 01/07/2021   Vitamin D deficiency 12/01/2020   Intolerance of continuous positive airway pressure (CPAP) ventilation 05/13/2020   Severe obstructive sleep apnea-hypopnea syndrome 05/13/2020   Hx of colonic polyps    Adenomatous polyp of ascending colon    Genetic testing 06/24/2019   Family history of uterine cancer    Family history of colon cancer    History of uterine cancer    History of sebaceous adenoma    S/P repair of paraesophageal hernia 03/12/2019   Syncope 02/27/2018   Community  acquired pneumonia 02/10/2018   CAP (community acquired pneumonia) 02/10/2018   Long term current use of antiarrhythmic drug 11/02/2015   Varicose veins 11/02/2015   OAB (overactive bladder) 09/07/2015   History of melanoma 06/10/2015   Thyroid nodule 06/10/2015   GERD (gastroesophageal reflux disease) 05/25/2015   Chronic venous insufficiency 05/25/2015   Hx of essential hypertension  05/25/2015   Atrial fibrillation (HCC) 04/22/2014   Osteoarthritis, hip, bilateral 03/25/2014   Lumbar and sacral osteoarthritis 12/10/2013   Past Medical History:  Diagnosis Date   Chronic edema    Clotting disorder (HCC)    Constipation    Difficult intubation    pt states that her neck needs to be in a neutral position,  scoliosisand herniated dics in neck   Family history of colon cancer    Family history of uterine cancer    Gallstones    GERD (gastroesophageal reflux disease)    protonix, hx h. pylori   Herniated disc, cervical    History of hiatal hernia    History of sebaceous adenoma    History of uterine cancer 04/22/2014   sees Dr. 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 Sharyn Lull in dermatology right upper arm   Obesity    OSA on CPAP    uses CPAP   Paroxysmal atrial fibrillation (HCC)    Peripheral vascular disease (HCC)    PONV (postoperative nausea and vomiting)    Recurrent UTI    S/P hip replacement    Scoliosis    Sleep apnea    Thyroid nodule    Urinary incontinence    Uterine cancer (HCC)    Stage 1   Venous insufficiency    s/p ablation   Family History  Problem Relation Age of Onset   Uterine cancer Mother    Emphysema Mother    Diabetes Brother 66   Hypertension Father 19   Hyperlipidemia Father    Epilepsy Father 12   Colon cancer Father 23   Leukemia Father    Arthritis Sister 45   Cancer Maternal Grandmother        unk type possibly breast or skin   Other Paternal Grandmother        influenza    Stroke Paternal Grandfather    Cancer Paternal Uncle        unk type   Skin cancer Daughter        SCC on back   Esophageal cancer Neg Hx    Rectal cancer Neg Hx    Stomach cancer Neg Hx    Past Surgical History:  Procedure Laterality Date   ABDOMINAL HYSTERECTOMY  09/10/2013   ATRIAL FIBRILLATION ABLATION N/A 08/09/2019   Procedure: ATRIAL  FIBRILLATION ABLATION;  Surgeon: Regan Lemming, MD;  Location: [facility];  Service: Cardiovascular;  Laterality: N/A;   BREAST LUMPECTOMY WITH RADIOACTIVE SEED AND SENTINEL LYMPH NODE BIOPSY Left 02/16/2021   Procedure: LEFT BREAST LUMPECTOMY WITH RADIOACTIVE SEED X2 AND SENTINEL LYMPH NODE BIOPSY;  Surgeon: Harriette Bouillon, MD;  Location: [facility];  Service: General;  Laterality: Left;   BUNIONECTOMY  10/1976   CHOLECYSTECTOMY N/A 03/12/2019   Procedure: XI ROBOTIC ASSISTED CHOLECYSTECTOMY;  Surgeon: Berna Bue, MD;  Location: [facility];  Service: General;  Laterality: N/A;   COLONOSCOPY     COLONOSCOPY WITH PROPOFOL N/A 01/17/2020   Procedure: COLONOSCOPY WITH  PROPOFOL;  Surgeon: Lynann Bologna, MD;  Location: [facility];  Service: Endoscopy;  Laterality: N/A;   DRUG INDUCED ENDOSCOPY N/A 03/04/2020   Procedure: DRUG INDUCED ENDOSCOPY;  Surgeon: Christia Reading, MD;  Location: [facility];  Service: ENT;  Laterality: N/A;   FRACTURE SURGERY  09/1964   floor of orbit right side   HIATAL HERNIA REPAIR     IMPLANTATION OF HYPOGLOSSAL NERVE STIMULATOR Right 04/15/2020   Procedure: IMPLANTATION OF HYPOGLOSSAL NERVE STIMULATOR;  Surgeon: Christia Reading, MD;  Location: [facility];  Service: ENT;  Laterality: Right;   JOINT REPLACEMENT     bilateral hip replacements   MELANOMA EXCISION  12/2011   POLYPECTOMY  01/17/2020   Procedure: POLYPECTOMY;  Surgeon: Lynann Bologna, MD;  Location: [facility];  Service: Endoscopy;;   TOTAL HIP ARTHROPLASTY Left 06/17/2014   Procedure: LEFT TOTAL HIP ARTHROPLASTY ANTERIOR APPROACH;  Surgeon: Kathryne Hitch, MD;  Location: [facility];  Service: Orthopedics;  Laterality: Left;   TOTAL HIP ARTHROPLASTY Right 09/02/2014   Procedure: RIGHT TOTAL HIP ARTHROPLASTY ANTERIOR APPROACH;  Surgeon: Kathryne Hitch, MD;  Location: [facility];  Service: Orthopedics;  Laterality: Right;   VEIN SURGERY  05/2011    venous ablation    Social History   Occupational History   Occupation: lawyer/RETIRED  Tobacco Use   Smoking status: Never   Smokeless tobacco: Never  Vaping Use   Vaping Use: Never used  Substance and Sexual Activity   Alcohol use: Not Currently    Comment: rarely at [facility]    Drug use: No   Sexual activity: Not Currently    Birth control/protection: Surgical

## 2022-08-05 ENCOUNTER — Encounter: Payer: Self-pay | Admitting: Sports Medicine

## 2022-08-09 DIAGNOSIS — L578 Other skin changes due to chronic exposure to nonionizing radiation: Secondary | ICD-10-CM | POA: Diagnosis not present

## 2022-08-09 DIAGNOSIS — D225 Melanocytic nevi of trunk: Secondary | ICD-10-CM | POA: Diagnosis not present

## 2022-08-09 DIAGNOSIS — L309 Dermatitis, unspecified: Secondary | ICD-10-CM | POA: Diagnosis not present

## 2022-08-09 DIAGNOSIS — L821 Other seborrheic keratosis: Secondary | ICD-10-CM | POA: Diagnosis not present

## 2022-08-09 DIAGNOSIS — L814 Other melanin hyperpigmentation: Secondary | ICD-10-CM | POA: Diagnosis not present

## 2022-08-09 DIAGNOSIS — Z85820 Personal history of malignant melanoma of skin: Secondary | ICD-10-CM | POA: Diagnosis not present

## 2022-09-12 ENCOUNTER — Encounter: Payer: Self-pay | Admitting: Sports Medicine

## 2022-09-26 ENCOUNTER — Ambulatory Visit: Payer: PPO | Admitting: Sports Medicine

## 2022-09-29 ENCOUNTER — Other Ambulatory Visit: Payer: Self-pay | Admitting: Cardiology

## 2022-09-29 DIAGNOSIS — I4891 Unspecified atrial fibrillation: Secondary | ICD-10-CM

## 2022-09-29 NOTE — Telephone Encounter (Signed)
Prescription refill request for Eliquis received. Indication: Afib  Last office visit: 08/20/21 ([place])  Scr: 0.81 (12/27/21)  Age: 72 Weight: 102kg  Office visit overdue. Called pt, no answer. Sent schedulers a message.

## 2022-10-03 NOTE — Telephone Encounter (Addendum)
Spoke with pt and she was transferred to main line for an appt. Will await an appt and follow up  Eliquis 5mg  refill request received. Patient is 72 years old, weight-102kg, Crea-0.81 on 12/27/21, Diagnosis-Afib, and last seen by Dr. Elberta Fortis on 08/20/21 & has an appt pending with Otilio Saber on 11/04/22. Dose is appropriate based on dosing criteria. Will send in refill to requested pharmacy.

## 2022-10-22 ENCOUNTER — Other Ambulatory Visit: Payer: Self-pay | Admitting: Cardiology

## 2022-11-03 NOTE — Progress Notes (Signed)
  Electrophysiology Office Note:   Date:  11/04/2022  ID:  [name], DOB 27-Dec-1950, MRN [id number]  Primary Cardiologist: None Electrophysiologist: Will Jorja Loa, MD      History of Present Illness:   [name] is a 72 y.o. female with h/o AF, OSA with hypoglossal stimulator and BiPAP,  seen today for routine electrophysiology followup.   Since last being seen in our clinic the patient reports doing very well from a cardiac perspective. Denies any recurrence s/p ablation.  she denies chest pain, palpitations, dyspnea, PND, orthopnea, nausea, vomiting, dizziness, syncope, edema, weight gain, or early satiety.   Review of systems complete and found to be negative unless listed in HPI.   EP Information / Studies Reviewed:    EKG is ordered today. Personal review as below.  EKG Interpretation Date/Time:  Friday November 04 2022 08:19:18 EDT Ventricular Rate:  71 PR Interval:  204 QRS Duration:  84 QT Interval:  388 QTC Calculation: 421 R Axis:   57  Text Interpretation: Normal sinus rhythm Normal ECG Confirmed by Maxine Glenn [phone]) on 11/04/2022 8:30:03 AM    AF history S/p ablation 07/2019   Physical Exam:   VS:  BP 118/80   Pulse 71   Ht 5' 8.5" (1.74 m)   Wt 235 lb (106.6 kg)   BMI 35.21 kg/m    Wt Readings from Last 3 Encounters:  11/04/22 235 lb (106.6 kg)  05/11/22 224 lb 12.8 oz (102 kg)  05/04/22 225 lb 15.5 oz (102.5 kg)     GEN: Well nourished, well developed in no acute distress NECK: No JVD; No carotid bruits CARDIAC: Regular rate and rhythm, no murmurs, rubs, gallops RESPIRATORY:  Clear to auscultation without rales, wheezing or rhonchi  ABDOMEN: Soft, non-tender, non-distended EXTREMITIES:  No edema; No deformity   ASSESSMENT AND PLAN:    Paroxysmal atrial fibrillation S/p ablation 07/2019 EKG today shows NSR Continue eliquis for CHA2DS2VASc of at least 3  OSA  Encouraged nightly CPAP/BiPAP Managed at outside hospital. Has  INSPIRE device.   Follow up with Dr. Elberta Fortis in 12 months  Signed, Graciella Freer, PA-C

## 2022-11-04 ENCOUNTER — Encounter: Payer: Self-pay | Admitting: Student

## 2022-11-04 ENCOUNTER — Ambulatory Visit: Payer: PPO | Admitting: Student

## 2022-11-04 VITALS — BP 118/80 | HR 71 | Ht 68.5 in | Wt 235.0 lb

## 2022-11-04 DIAGNOSIS — G4733 Obstructive sleep apnea (adult) (pediatric): Secondary | ICD-10-CM | POA: Diagnosis not present

## 2022-11-04 DIAGNOSIS — I480 Paroxysmal atrial fibrillation: Secondary | ICD-10-CM | POA: Diagnosis not present

## 2022-11-04 NOTE — Patient Instructions (Signed)
Medication Instructions:  Your physician recommends that you continue on your current medications as directed. Please refer to the Current Medication list given to you today.  *If you need a refill on your cardiac medications before your next appointment, please call your pharmacy*  Lab Work: BMET, CBC--TODAY If you have labs (blood work) drawn today and your tests are completely normal, you will receive your results only by: MyChart Message (if you have MyChart) OR A paper copy in the mail If you have any lab test that is abnormal or we need to change your treatment, we will call you to review the results.   Follow-Up: At [facility], you and your health needs are our priority.  As part of our continuing mission to provide you with exceptional heart care, we have created designated Provider Care Teams.  These Care Teams include your primary Cardiologist (physician) and Advanced Practice Providers (APPs -  Physician Assistants and Nurse Practitioners) who all work together to provide you with the care you need, when you need it.  Your next appointment:   1 year(s)  Provider:   Loman Brooklyn, MD

## 2022-11-05 LAB — BASIC METABOLIC PANEL
BUN/Creatinine Ratio: 19 (ref 12–28)
BUN: 16 mg/dL (ref 8–27)
CO2: 24 mmol/L (ref 20–29)
Calcium: 9.6 mg/dL (ref 8.7–10.3)
Chloride: 99 mmol/L (ref 96–106)
Creatinine, Ser: 0.84 mg/dL (ref 0.57–1.00)
Glucose: 102 mg/dL — ABNORMAL HIGH (ref 70–99)
Potassium: 5 mmol/L (ref 3.5–5.2)
Sodium: 137 mmol/L (ref 134–144)
eGFR: 73 mL/min/{1.73_m2} (ref >59)

## 2022-11-05 LAB — CBC
Hematocrit: 41.9 % (ref 34.0–46.6)
Hemoglobin: 14.2 g/dL (ref 11.1–15.9)
MCH: 35.1 pg — ABNORMAL HIGH (ref 26.6–33.0)
MCHC: 33.9 g/dL (ref 31.5–35.7)
MCV: 104 fL — ABNORMAL HIGH (ref 79–97)
Platelets: 228 10*3/uL (ref 150–450)
RBC: 4.04 x10E6/uL (ref 3.77–5.28)
RDW: 12.1 % (ref 11.7–15.4)
WBC: 4.7 10*3/uL (ref 3.4–10.8)

## 2022-11-17 ENCOUNTER — Other Ambulatory Visit: Payer: Self-pay | Admitting: Cardiology

## 2022-11-17 DIAGNOSIS — I4891 Unspecified atrial fibrillation: Secondary | ICD-10-CM

## 2022-11-17 NOTE — Telephone Encounter (Signed)
Eliquis 5mg  refill request received. Patient is 72 years old, weight-106.6kg, Crea-0.84 on 11/04/22, Diagnosis-Afib, and last seen by Otilio Saber on 11/04/22. Dose is appropriate based on dosing criteria. Will send in refill to requested pharmacy.

## 2022-11-18 ENCOUNTER — Encounter: Payer: Self-pay | Admitting: Cardiology

## 2022-11-21 ENCOUNTER — Other Ambulatory Visit: Payer: Self-pay | Admitting: Cardiology

## 2022-11-23 DIAGNOSIS — C773 Secondary and unspecified malignant neoplasm of axilla and upper limb lymph nodes: Secondary | ICD-10-CM | POA: Diagnosis not present

## 2022-11-23 DIAGNOSIS — C50919 Malignant neoplasm of unspecified site of unspecified female breast: Secondary | ICD-10-CM | POA: Diagnosis not present

## 2022-11-23 DIAGNOSIS — D6869 Other thrombophilia: Secondary | ICD-10-CM | POA: Diagnosis not present

## 2022-11-23 DIAGNOSIS — D689 Coagulation defect, unspecified: Secondary | ICD-10-CM | POA: Diagnosis not present

## 2022-11-23 DIAGNOSIS — I10 Essential (primary) hypertension: Secondary | ICD-10-CM | POA: Diagnosis not present

## 2022-11-23 DIAGNOSIS — K5900 Constipation, unspecified: Secondary | ICD-10-CM | POA: Diagnosis not present

## 2022-11-23 DIAGNOSIS — M1990 Unspecified osteoarthritis, unspecified site: Secondary | ICD-10-CM | POA: Diagnosis not present

## 2022-11-23 DIAGNOSIS — I4891 Unspecified atrial fibrillation: Secondary | ICD-10-CM | POA: Diagnosis not present

## 2022-11-23 DIAGNOSIS — K219 Gastro-esophageal reflux disease without esophagitis: Secondary | ICD-10-CM | POA: Diagnosis not present

## 2022-11-23 DIAGNOSIS — E669 Obesity, unspecified: Secondary | ICD-10-CM | POA: Diagnosis not present

## 2022-11-23 DIAGNOSIS — M5030 Other cervical disc degeneration, unspecified cervical region: Secondary | ICD-10-CM | POA: Diagnosis not present

## 2022-11-23 DIAGNOSIS — H259 Unspecified age-related cataract: Secondary | ICD-10-CM | POA: Diagnosis not present

## 2022-12-08 ENCOUNTER — Other Ambulatory Visit: Payer: Self-pay | Admitting: Cardiology

## 2022-12-21 ENCOUNTER — Other Ambulatory Visit: Payer: Self-pay | Admitting: Adult Health

## 2022-12-21 DIAGNOSIS — Z9889 Other specified postprocedural states: Secondary | ICD-10-CM

## 2022-12-28 ENCOUNTER — Ambulatory Visit
Admission: RE | Admit: 2022-12-28 | Discharge: 2022-12-28 | Disposition: A | Discharge status: Home / Self Care | Payer: PPO | Source: Ambulatory Visit | Attending: Adult Health | Admitting: Adult Health

## 2022-12-28 DIAGNOSIS — Z9889 Other specified postprocedural states: Secondary | ICD-10-CM

## 2022-12-28 DIAGNOSIS — Z853 Personal history of malignant neoplasm of breast: Secondary | ICD-10-CM | POA: Diagnosis not present

## 2022-12-28 HISTORY — DX: Personal history of irradiation: Z92.3

## 2022-12-29 ENCOUNTER — Ambulatory Visit: Payer: PPO | Admitting: Internal Medicine

## 2022-12-29 ENCOUNTER — Encounter: Payer: Self-pay | Admitting: Internal Medicine

## 2022-12-29 VITALS — BP 120/80 | HR 73 | Temp 98.4°F | Ht 68.0 in | Wt 236.5 lb

## 2022-12-29 DIAGNOSIS — Z8679 Personal history of other diseases of the circulatory system: Secondary | ICD-10-CM

## 2022-12-29 DIAGNOSIS — Z13820 Encounter for screening for osteoporosis: Secondary | ICD-10-CM | POA: Diagnosis not present

## 2022-12-29 DIAGNOSIS — Z23 Encounter for immunization: Secondary | ICD-10-CM | POA: Diagnosis not present

## 2022-12-29 DIAGNOSIS — E559 Vitamin D deficiency, unspecified: Secondary | ICD-10-CM | POA: Diagnosis not present

## 2022-12-29 DIAGNOSIS — I872 Venous insufficiency (chronic) (peripheral): Secondary | ICD-10-CM | POA: Diagnosis not present

## 2022-12-29 DIAGNOSIS — C50412 Malignant neoplasm of upper-outer quadrant of left female breast: Secondary | ICD-10-CM | POA: Diagnosis not present

## 2022-12-29 DIAGNOSIS — Z0000 Encounter for general adult medical examination without abnormal findings: Secondary | ICD-10-CM | POA: Diagnosis not present

## 2022-12-29 DIAGNOSIS — Z170 Estrogen receptor positive status [ER+]: Secondary | ICD-10-CM

## 2022-12-29 NOTE — Progress Notes (Signed)
Established Patient Office Visit     CC/Reason for Visit: Annual preventive exam and subsequent Medicare wellness visit  HPI: [name] is a 72 y.o. female who is coming in today for the above mentioned reasons. 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  Feeling well without major complaints.  She is requesting a handicap placard filled out she is currently on Augmentin for a dental infection and will need a root canal soon.  Due for flu, COVID, shingles vaccinations.  Due for DEXA scan.   Past Medical/Surgical History: Past Medical History:  Diagnosis Date   Chronic edema    Clotting disorder (HCC)    Constipation    Difficult intubation    pt states that her neck needs to be in a neutral position,  scoliosisand herniated dics in neck   Family history of colon cancer    Family history of uterine cancer    Gallstones    GERD (gastroesophageal reflux disease)    protonix, hx h. pylori   Herniated disc, cervical    History of hiatal hernia    History of sebaceous adenoma    History of uterine cancer 04/22/2014   sees Dr. 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 Sharyn Lull in dermatology right upper arm   Obesity    OSA on CPAP    uses CPAP   Paroxysmal atrial fibrillation (HCC)    Peripheral vascular disease (HCC)    Personal history of radiation therapy    PONV (postoperative nausea and vomiting)    Recurrent UTI    S/P hip replacement    Scoliosis    Sleep apnea    Thyroid nodule    Urinary incontinence    Uterine cancer (HCC)    Stage 1   Venous insufficiency    s/p ablation    Past Surgical History:  Procedure Laterality Date   ABDOMINAL HYSTERECTOMY  09/10/2013   ATRIAL FIBRILLATION ABLATION N/A 08/09/2019   Procedure: ATRIAL  FIBRILLATION ABLATION;  Surgeon: Regan Lemming, MD;  Location: [facility];  Service: Cardiovascular;  Laterality: N/A;   BREAST LUMPECTOMY WITH RADIOACTIVE SEED AND SENTINEL LYMPH NODE BIOPSY Left 02/16/2021   Procedure: LEFT BREAST LUMPECTOMY WITH RADIOACTIVE SEED X2 AND SENTINEL LYMPH NODE BIOPSY;  Surgeon: Harriette Bouillon, MD;  Location: [facility];  Service: General;  Laterality: Left;   BUNIONECTOMY  10/1976   CHOLECYSTECTOMY N/A 03/12/2019   Procedure: XI ROBOTIC ASSISTED CHOLECYSTECTOMY;  Surgeon: Berna Bue, MD;  Location: [facility];  Service: General;  Laterality: N/A;   COLONOSCOPY     COLONOSCOPY WITH PROPOFOL N/A 01/17/2020   Procedure: COLONOSCOPY WITH PROPOFOL;  Surgeon: Lynann Bologna, MD;  Location: [facility];  Service: Endoscopy;  Laterality: N/A;   DRUG INDUCED ENDOSCOPY N/A 03/04/2020   Procedure: DRUG INDUCED ENDOSCOPY;  Surgeon: Christia Reading, MD;  Location: [facility];  Service: ENT;  Laterality: N/A;   FRACTURE SURGERY  09/1964   floor of orbit right side   HIATAL HERNIA REPAIR     IMPLANTATION OF HYPOGLOSSAL NERVE STIMULATOR Right 04/15/2020   Procedure: IMPLANTATION OF HYPOGLOSSAL NERVE STIMULATOR;  Surgeon: Christia Reading, MD;  Location: [facility];  Service: ENT;  Laterality: Right;   JOINT REPLACEMENT  bilateral hip replacements   MELANOMA EXCISION  12/2011   POLYPECTOMY  01/17/2020   Procedure: POLYPECTOMY;  Surgeon: Lynann Bologna, MD;  Location: [facility];  Service: Endoscopy;;   TOTAL HIP ARTHROPLASTY Left 06/17/2014   Procedure: LEFT TOTAL HIP ARTHROPLASTY ANTERIOR APPROACH;  Surgeon: Kathryne Hitch, MD;  Location: [facility];  Service: Orthopedics;  Laterality: Left;   TOTAL HIP ARTHROPLASTY Right 09/02/2014   Procedure: RIGHT TOTAL HIP ARTHROPLASTY ANTERIOR APPROACH;  Surgeon: Kathryne Hitch, MD;  Location: [facility];  Service: Orthopedics;  Laterality: Right;   VEIN SURGERY  05/2011    venous ablation     Social History:  reports that she has never smoked. She has never used smokeless tobacco. She reports that she does not currently use alcohol. She reports that she does not use drugs.  Allergies: Allergies  Allergen Reactions   Hydrolyzed Silk Hives and Other (See Comments)    Silk tape-blisters   Ciprofloxacin Other (See Comments)    Reactions with antiarrythmic Reactions with antiarrythmic   Gabapentin    Gluten Meal Other (See Comments)    reflux   Metoprolol     dizzy    Family History:  Family History  Problem Relation Age of Onset   Uterine cancer Mother    Emphysema Mother    Diabetes Brother 33   Hypertension Father 44   Hyperlipidemia Father    Epilepsy Father 19   Colon cancer Father 6   Leukemia Father    Arthritis Sister 42   Cancer Maternal Grandmother        unk type possibly breast or skin   Other Paternal Grandmother        influenza    Stroke Paternal Grandfather    Cancer Paternal Uncle        unk type   Skin cancer Daughter        SCC on back   Esophageal cancer Neg Hx    Rectal cancer Neg Hx    Stomach cancer Neg Hx      Current Outpatient Medications:    acetaminophen (TYLENOL) 500 MG tablet, Take 2 tablets (1,000 mg total) by mouth every 6 (six) hours as needed., Disp: , Rfl: 0   apixaban (ELIQUIS) 5 MG TABS tablet, TAKE 1 TABLET(5 MG) BY MOUTH TWICE DAILY., Disp: 180 tablet, Rfl: 1   Cranberry 500 MG TABS, Take 1,000 mg by mouth 2 (two) times daily., Disp: , Rfl:    Cyanocobalamin (VITAMIN B 12) 500 MCG TABS, Take 1 tablet by mouth daily., Disp: , Rfl:    diltiazem (CARDIZEM) 60 MG tablet, Take 60 mg daily if needed for palpitations, Disp: 30 tablet, Rfl: 9   diltiazem (TIAZAC) 240 MG 24 hr capsule, TAKE 1 CAPSULE BY MOUTH EVERY DAY, Disp: 90 capsule, Rfl: 3   famotidine (PEPCID) 20 MG tablet, Take 20 mg by mouth 2 (two) times daily., Disp: , Rfl:    ibuprofen (ADVIL) 200 MG tablet, Take 400 mg by mouth every 6 (six)  hours as needed (pain)., Disp: , Rfl:    letrozole (FEMARA) 2.5 MG tablet, Take 1 tablet (2.5 mg total) by mouth daily., Disp: 90 tablet, Rfl: 3   OVER THE COUNTER MEDICATION, Take 20 mg by mouth daily as needed (arthritis). CBD oil, Disp: , Rfl:    polyethylene glycol (MIRALAX / GLYCOLAX) 17 g packet, Take 17 g by mouth as needed (as directed)., Disp: , Rfl:    sennosides-docusate sodium (SENOKOT-S) 8.6-50 MG tablet, Take 1  tablet by mouth as needed for constipation., Disp: , Rfl:    spironolactone (ALDACTONE) 50 MG tablet, TAKE 2 TABLETS BY MOUTH EVERY MORNING AND 1 TABLET EVERY EVENING, Disp: 270 tablet, Rfl: 3   triamcinolone cream (KENALOG) 0.1 %, Apply 1 application topically daily as needed (for dry skin). , Disp: , Rfl: 1   Vibegron (GEMTESA) 75 MG TABS, Take 75 mg by mouth daily., Disp: , Rfl:    vitamin C (ASCORBIC ACID) 500 MG tablet, Take 500 mg by mouth 2 (two) times daily., Disp: , Rfl:   Review of Systems:  Negative unless indicated in HPI.   Physical Exam: Vitals:   12/29/22 0716  BP: 120/80  Pulse: 73  Temp: 98.4 F (36.9 C)  TempSrc: Oral  SpO2: 98%  Weight: 236 lb 8 oz (107.3 kg)  Height: 5\' 8"  (1.727 m)    Body mass index is 35.96 kg/m.   Physical Exam Vitals reviewed.  Constitutional:      General: She is not in acute distress.    Appearance: Normal appearance. She is not ill-appearing, toxic-appearing or diaphoretic.  HENT:     Head: Normocephalic.     Right Ear: Tympanic membrane, ear canal and external ear normal. There is no impacted cerumen.     Left Ear: Tympanic membrane, ear canal and external ear normal. There is no impacted cerumen.     Nose: Nose normal.     Mouth/Throat:     Mouth: Mucous membranes are moist.     Pharynx: Oropharynx is clear. No oropharyngeal exudate or posterior oropharyngeal erythema.  Eyes:     General: No scleral icterus.       Right eye: No discharge.        Left eye: No discharge.     Conjunctiva/sclera:  Conjunctivae normal.     Pupils: Pupils are equal, round, and reactive to light.  Neck:     Vascular: No carotid bruit.  Cardiovascular:     Rate and Rhythm: Normal rate and regular rhythm.     Pulses: Normal pulses.     Heart sounds: Normal heart sounds.  Pulmonary:     Effort: Pulmonary effort is normal. No respiratory distress.     Breath sounds: Normal breath sounds.  Abdominal:     General: Abdomen is flat. Bowel sounds are normal.     Palpations: Abdomen is soft.  Musculoskeletal:        General: Normal range of motion.     Cervical back: Normal range of motion.  Skin:    General: Skin is warm and dry.  Neurological:     General: No focal deficit present.     Mental Status: She is alert and oriented to person, place, and time. Mental status is at baseline.  Psychiatric:        Mood and Affect: Mood normal.        Behavior: Behavior normal.        Thought Content: Thought content normal.        Judgment: Judgment normal.    Subsequent Medicare wellness visit   1. Risk factors, based on past  M,S,F -  CAD risk factors include age   2.  Physical activities: Dietary issues and exercise activities discussed:      3.  Depression/mood:  Flowsheet Row Office Visit from 02/15/2022 in [facility] Total Score 6        4.  ADL's:    12/29/2022  7:10 AM  In your present state of health, do you have any difficulty performing the following activities:  Hearing? 1  Vision? 0  Difficulty concentrating or making decisions? 0  Walking or climbing stairs? 1  Dressing or bathing? 0  Doing errands, shopping? 0  Preparing Food and eating ? N  Using the Toilet? N  In the past six months, have you accidently leaked urine? Y  Do you have problems with loss of bowel control? N  Managing your Medications? N  Managing your Finances? N  Housekeeping or managing your Housekeeping? N     5.  Fall risk:     12/27/2021    7:11 AM  02/15/2022   11:04 AM 03/17/2022   10:00 AM 05/10/2022   10:38 AM 12/29/2022    7:13 AM  Fall Risk  Falls in the past year? 0 0  1 1  Was there an injury with Fall? 0 0  1 1  Fall Risk Category Calculator 0 0  2 2  Fall Risk Category (Retired) Low Low     (RETIRED) Patient Fall Risk Level Low fall risk Low fall risk Low fall risk    Patient at Risk for Falls Due to No Fall Risks No Fall Risks     Fall risk Follow up Falls evaluation completed Falls evaluation completed   Falls evaluation completed     6.  Home safety: No problems identified   7.  Height weight, and visual acuity: height and weight as above, vision/hearing: Vision Screening   Right eye Left eye Both eyes  Without correction     With correction 20/30 20/30 2030     8.  Counseling: Counseling given: Not Answered    9. Lab orders based on risk factors: Laboratory update will be reviewed   10. Cognitive assessment:    01/01/2018    9:49 AM  MMSE - Mini Mental State Exam  Not completed: --        12/29/2022    7:14 AM  6CIT Screen  What Year? 0 points  What month? 0 points  What time? 0 points  Count back from 20 0 points  Months in reverse 0 points  Repeat phrase 0 points  Total Score 0 points     11. Screening: Patient provided with a written and personalized 5-10 year screening schedule in the AVS. Health Maintenance  Topic Date Due   Flu Shot  10/13/2022   Colon Cancer Screening  01/17/2023   COVID-19 Vaccine (4 - 2023-24 season) 01/14/2023*   Zoster (Shingles) Vaccine (1 of 2) 03/31/2023*   DTaP/Tdap/Td vaccine (2 - Tdap) 10/13/2023   Mammogram  12/28/2023   Medicare Annual Wellness Visit  12/29/2023   Pneumonia Vaccine  Completed   DEXA scan (bone density measurement)  Completed   Hepatitis C Screening  Completed   HPV Vaccine  Aged Out  *Topic was postponed. The date shown is not the original due date.    12. Provider List Update: Patient Care Team    Relationship Specialty  Notifications Start End  Philip Aspen, Limmie Patricia, MD PCP - General Internal Medicine  08/06/20   Regan Lemming, MD PCP - Electrophysiology Cardiology  03/22/18   Chrystie Nose, MD Consulting Physician Cardiology  05/25/15   Olivia Mackie, MD Consulting Physician Obstetrics and Gynecology  05/25/15   Jacqlyn Krauss, MD Referring Physician Dermatology  05/25/15   Crist Fat, MD Attending Physician Urology  09/07/15   Pamelia Hoit,  Mikey College, MD Consulting Physician Hematology and Oncology  09/01/21   Lonie Peak, MD Attending Physician Radiation Oncology  09/01/21   Harriette Bouillon, MD Consulting Physician General Surgery  09/01/21      13. Advance Directives: Does Patient Have a Medical Advance Directive?: Yes Type of Advance Directive: Healthcare Power of Attorney, Living will, Out of facility DNR (pink MOST or yellow form) Copy of Healthcare Power of Attorney in Chart?: No - copy requested  14. Opioids: Patient is not on any opioid prescriptions and has no risk factors for a substance use disorder.   15.   Goals      Weight (lb) < 200 lb (90.7 kg)     Go back to riding the stationary bike- try for 30 minutes 5 days a week      Weight (lb) < 200 lb (90.7 kg)     Went to the diet center in the past  Chicken, fish, [place] and 2 tsp of fat  Fruits and vegetables    Check out  online nutrition programs as WikiBlast.com.cy and LimitLaws.com.cy; fit55me; Look for foods with "whole" wheat; bran; oatmeal etc Shot at the [employer] in season for fresher choices  Watch for "hydrogenated" on the label of oils which are trans-fats.  Watch for "high fructose corn syrup" in snacks, yogurt or ketchup  Meats have less marbling; bright colored fruits and vegetables;  Canned; dump out liquid and wash vegetables. Be mindful of what we are eating  Portion control is essential to a health weight! Sit down; take a break and enjoy your meal; take smaller bites; put the fork down  between bites;  It takes 20 minutes to get full; so check in with your fullness cues and stop eating when you start to fill full            Weight (lb) < 200 lb (90.7 kg)         I have personally reviewed and noted the following in the patient's chart:   Medical and social history Use of alcohol, tobacco or illicit drugs  Current medications and supplements Functional ability and status Nutritional status Physical activity Advanced directives List of other physicians Hospitalizations, surgeries, and ER visits in previous 12 months Vitals Screenings to include cognitive, depression, and falls Referrals and appointments  In addition, I have reviewed and discussed with patient certain preventive protocols, quality metrics, and best practice recommendations. A written personalized care plan for preventive services as well as general preventive health recommendations were provided to patient.   Impression and Plan:  Encounter for subsequent annual wellness visit (AWV) in Medicare patient  Vitamin D deficiency -     VITAMIN D 25 Hydroxy (Vit-D Deficiency, Fractures); Future -     DG Bone Density; Future  Hx of essential hypertension -     CBC with Differential/Platelet; Future -     Comprehensive metabolic panel; Future -     Lipid panel; Future  Screening for osteoporosis -     DG Bone Density; Future  Malignant neoplasm of upper-outer quadrant of left breast in female, estrogen receptor positive (HCC) -     TSH; Future -     Vitamin B12; Future  Immunization due  Chronic venous insufficiency -     Ambulatory referral to Vascular Surgery   -Recommend routine eye and dental care. -Healthy lifestyle discussed in detail. -Labs to be updated today. -Prostate cancer screening: N/A Health Maintenance  Topic Date Due   Flu Shot  10/13/2022   Colon Cancer Screening  01/17/2023   COVID-19 Vaccine (4 - 2023-24 season) 01/14/2023*   Zoster (Shingles) Vaccine (1 of  2) 03/31/2023*   DTaP/Tdap/Td vaccine (2 - Tdap) 10/13/2023   Mammogram  12/28/2023   Medicare Annual Wellness Visit  12/29/2023   Pneumonia Vaccine  Completed   DEXA scan (bone density measurement)  Completed   Hepatitis C Screening  Completed   HPV Vaccine  Aged Out  *Topic was postponed. The date shown is not the original due date.    -Flu vaccine in office today. -Handicap placard filled. -She will obtain COVID-vaccine at pharmacy. -Requesting vein and vascular consult, placed.     Chaya Jan, MD [facility]

## 2022-12-29 NOTE — Addendum Note (Signed)
Addended by: Kern Reap B on: 12/29/2022 09:38 AM   Modules accepted: Orders

## 2023-01-03 ENCOUNTER — Other Ambulatory Visit (INDEPENDENT_AMBULATORY_CARE_PROVIDER_SITE_OTHER): Payer: PPO

## 2023-01-03 DIAGNOSIS — E559 Vitamin D deficiency, unspecified: Secondary | ICD-10-CM

## 2023-01-03 DIAGNOSIS — C50412 Malignant neoplasm of upper-outer quadrant of left female breast: Secondary | ICD-10-CM | POA: Diagnosis not present

## 2023-01-03 DIAGNOSIS — Z170 Estrogen receptor positive status [ER+]: Secondary | ICD-10-CM

## 2023-01-03 DIAGNOSIS — Z8679 Personal history of other diseases of the circulatory system: Secondary | ICD-10-CM

## 2023-01-03 LAB — CBC WITH DIFFERENTIAL/PLATELET
Basophils Absolute: 0 10*3/uL (ref 0.0–0.1)
Basophils Relative: 0.8 % (ref 0.0–3.0)
Eosinophils Absolute: 0.1 10*3/uL (ref 0.0–0.7)
Eosinophils Relative: 1.2 % (ref 0.0–5.0)
HCT: 43 % (ref 36.0–46.0)
Hemoglobin: 14.5 g/dL (ref 12.0–15.0)
Lymphocytes Relative: 30.5 % (ref 12.0–46.0)
Lymphs Abs: 1.4 10*3/uL (ref 0.7–4.0)
MCHC: 33.6 g/dL (ref 30.0–36.0)
MCV: 104.8 fL — ABNORMAL HIGH (ref 78.0–100.0)
Monocytes Absolute: 0.8 10*3/uL (ref 0.1–1.0)
Monocytes Relative: 16.3 % — ABNORMAL HIGH (ref 3.0–12.0)
Neutro Abs: 2.4 10*3/uL (ref 1.4–7.7)
Neutrophils Relative %: 51.2 % (ref 43.0–77.0)
Platelets: 226 10*3/uL (ref 150.0–400.0)
RBC: 4.11 Mil/uL (ref 3.87–5.11)
RDW: 13 % (ref 11.5–15.5)
WBC: 4.6 10*3/uL (ref 4.0–10.5)

## 2023-01-03 LAB — COMPREHENSIVE METABOLIC PANEL
ALT: 26 U/L (ref 0–35)
AST: 22 U/L (ref 0–37)
Albumin: 4.6 g/dL (ref 3.5–5.2)
Alkaline Phosphatase: 78 U/L (ref 39–117)
BUN: 19 mg/dL (ref 6–23)
CO2: 27 meq/L (ref 19–32)
Calcium: 9.7 mg/dL (ref 8.4–10.5)
Chloride: 98 meq/L (ref 96–112)
Creatinine, Ser: 0.79 mg/dL (ref 0.40–1.20)
GFR: 74.01 mL/min (ref >60.00)
Glucose, Bld: 92 mg/dL (ref 70–99)
Potassium: 4.8 meq/L (ref 3.5–5.1)
Sodium: 133 meq/L — ABNORMAL LOW (ref 135–145)
Total Bilirubin: 0.7 mg/dL (ref 0.2–1.2)
Total Protein: 6.9 g/dL (ref 6.0–8.3)

## 2023-01-03 LAB — LIPID PANEL
Cholesterol: 148 mg/dL (ref 0–200)
HDL: 39.2 mg/dL (ref >39.00)
LDL Cholesterol: 87 mg/dL (ref 0–99)
NonHDL: 109.09
Total CHOL/HDL Ratio: 4
Triglycerides: 108 mg/dL (ref 0.0–149.0)
VLDL: 21.6 mg/dL (ref 0.0–40.0)

## 2023-01-03 LAB — VITAMIN B12: Vitamin B-12: 1247 pg/mL — ABNORMAL HIGH (ref 211–911)

## 2023-01-03 LAB — TSH: TSH: 2.32 u[IU]/mL (ref 0.35–5.50)

## 2023-01-03 LAB — VITAMIN D 25 HYDROXY (VIT D DEFICIENCY, FRACTURES): VITD: 40.4 ng/mL (ref 30.00–100.00)

## 2023-01-06 ENCOUNTER — Other Ambulatory Visit: Payer: Self-pay | Admitting: *Deleted

## 2023-01-06 DIAGNOSIS — I872 Venous insufficiency (chronic) (peripheral): Secondary | ICD-10-CM

## 2023-01-11 ENCOUNTER — Ambulatory Visit (HOSPITAL_COMMUNITY)
Admission: RE | Admit: 2023-01-11 | Discharge: 2023-01-11 | Disposition: A | Discharge status: Home / Self Care | Payer: PPO | Source: Ambulatory Visit | Attending: Vascular Surgery | Admitting: Vascular Surgery

## 2023-01-11 DIAGNOSIS — I872 Venous insufficiency (chronic) (peripheral): Secondary | ICD-10-CM | POA: Insufficient documentation

## 2023-01-16 ENCOUNTER — Ambulatory Visit: Payer: PPO | Admitting: Neurology

## 2023-01-16 ENCOUNTER — Encounter: Payer: Self-pay | Admitting: Neurology

## 2023-01-16 ENCOUNTER — Telehealth: Payer: Self-pay | Admitting: Neurology

## 2023-01-16 VITALS — BP 130/60 | HR 70 | Ht 68.0 in | Wt 236.0 lb

## 2023-01-16 DIAGNOSIS — Z9682 Presence of neurostimulator: Secondary | ICD-10-CM | POA: Insufficient documentation

## 2023-01-16 NOTE — Telephone Encounter (Signed)
Patient was seen for an inspire follow up.

## 2023-01-16 NOTE — Progress Notes (Signed)
Provider:  Melvyn Novas, MD  Primary Care Physician:  Philip Aspen, Limmie Patricia, MD [address] [place] [address]     Referring Provider: Philip Aspen, Limmie Patricia, Md [address] [place],  [place] [address]          Chief Complaint according to patient   Patient presents with:                HISTORY OF PRESENT ILLNESS:  [name] is a 72 y.o. female patient who is here for revisit 01/16/2023 for  INSPIRE.  Chief concern according to patient :  " I had severe dental pain, and couldn't tolerate inspire through the month of October.  I felt crummy. - now I have a medicated filling and feel already better:.     [name] is a 76 -year- old Caucasian female patient was seen here  on 08/26/2020. She has currently a UTI and her sleep has been affected. Sleep is better in the sense that she can sleep for intervals of up to three hours , interrupted by bathroom breaks. Her  higher V setting became uncomfortable. The most comfortable setting is 1.7 V now, but its not the most therapeutic.  She did not bring her remote control to this visit (!) . Her tongue motion was noticed at 1.7/ 1.8 V and very strong "buckling" of the tongue- at 1.9V that is now uncomfortable. I an attempt to get a smoother tongue movement we will reduce the Voltage- 0.9V, which was felt as smoother,  then 1.0 V- felt very tolerable . Then 1.2 V-felt OK , tongue is moving.  Then 1.4 V - visible tongue motion, stronger but OK.   This follows a titration on INSPIRE device  08-19-2020.  We were able to establish baseline apnea at 0 VOLT for 92 minutes with an AHI of 48/h. ( SEVERE baseline apnea )  1.         Titration allowed to reduce the severity to Moderate -severe Sleep Apnea/ Hypopnea - at 1.9V there was still an AHI of 16.8/h noted and events spared REM sleep, indicating a central apnea is likely present as well as hypopnea.  2.         The majority of AHI events  occurred I supine sleep.   3.         Hypoxemia also persisted and required 1 liter additional oxygen, see attached screenshot.  4.         Regular rhythm by EKG during this study.    RECOMMENDATIONS:  [name] will not treat central apneas or hypoxemia, but reduced the AHI to 16.8 from 48.3/h. This is at the current setting of 1.9 V. The patient required oxygen during this titration study, but I cannot order oxygen based on this study- INSPIRE will not correct hypoxemia. This is a partial treatment and the patient can benefit from weight loss to reduce the AHI further. She is advised to avoid the supine sleep position, which reduced the AHI to below 10/h.     The patient will follow up in 6-8 weeks with remote- reset to level 3 for now 0.9/ 1.21 V. Get treated for UTI and then increase to 1.5 V .  New pulse rate ( set now 33) and pulse width 120, allowing a softer tongue motion. Following subjective benefit.        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 right orbital floor fracture at age 43, while water skiing, more exophthalmos on the left than right.     Review of Systems: Out of a complete 14 system review, the patient complains of only the following symptoms, and all other reviewed systems are negative.:  Fatigue,  sleepiness , snoring, fragmented sleep, Insomnia, arm pain, supraspinatus muscle tear.   How likely are you to doze in the following situations: 0 = not likely, 1 = slight chance, 2 = moderate chance, 3 = high chance   Sitting and Reading? Watching Television? Sitting inactive in a public place (theater or meeting)? As a passenger in a car for an hour without a break? Lying down in the afternoon when circumstances permit? Sitting and talking to someone? Sitting quietly after lunch without alcohol? In a car, while stopped for a few minutes in traffic?   Total = 7/ 24 points   FSS endorsed at 23/ 63 points.   Social History   Socioeconomic History   Marital status: Divorced    Spouse name: Not on file   Number of children: 2   Years of education: JD   Highest education level: Professional school degree (e.g., MD, DDS, DVM, JD)  Occupational History   Occupation: lawyer/RETIRED  Tobacco Use   Smoking status: Never   Smokeless tobacco: Never  Vaping Use   Vaping status: Never Used  Substance and Sexual Activity   Alcohol use: Not Currently    Comment: rarely at [facility]    Drug use: No   Sexual activity: Not Currently    Birth control/protection: Surgical  Other Topics Concern   Not on file  Social History Narrative   Work or School: retired Pensions consultant      Home Situation: lives alone - takes care of twin grandchildren 7 months in 05/2015      Spiritual Beliefs:       Lifestyle: active, healthy diet      Social Determinants of Health   Financial Resource Strain: Low Risk  (12/29/2022)   Overall Financial Resource Strain (CARDIA)    Difficulty of Paying Living Expenses: Not very hard  Food Insecurity: No Food Insecurity (12/29/2022)   Hunger Vital Sign    Worried About Running Out of Food in the Last Year: Never true    Ran Out of Food in the Last Year: Never true  Transportation Needs: No Transportation Needs (12/29/2022)   PRAPARE - Scientist, research (physical sciences) (Medical): No    Lack of Transportation (Non-Medical): No  Physical Activity: Sufficiently Active (12/29/2022)   Exercise Vital Sign    Days of Exercise per Week: 7 days    Minutes of Exercise per Session: 30 min  Stress: No Stress Concern Present (12/29/2022)   [employer] of Occupational Health - Occupational Stress Questionnaire    Feeling of Stress : Only a little  Social Connections: Moderately Integrated (12/29/2022)   Social Connection and Isolation Panel [NHANES]    Frequency of Communication with Friends and  Family: More than three times a week    Frequency of Social Gatherings with Friends and Family: More than three times a week    Attends Religious Services: More than 4 times per year    Active Member of Clubs or Organizations: Yes    Attends Engineer, structural: More than 4 times per year    Marital Status: Divorced    Family History  Problem Relation Age of Onset   Uterine cancer Mother    Emphysema Mother    Diabetes Brother 85   Hypertension Father 37   Hyperlipidemia Father    Epilepsy Father 51   Colon cancer Father 1   Leukemia Father    Arthritis Sister 13   Cancer Maternal Grandmother        unk type possibly breast or skin   Other Paternal Grandmother        influenza    Stroke Paternal Grandfather    Cancer Paternal Uncle        unk type   Skin cancer Daughter        SCC on back   Esophageal cancer Neg Hx    Rectal cancer Neg Hx    Stomach cancer Neg Hx     Past Medical History:  Diagnosis Date   Chronic edema    Clotting disorder (HCC)    Constipation    Difficult intubation    pt states that her neck needs to be in a neutral position,  scoliosisand herniated dics in neck   Family history of colon cancer    Family history of uterine cancer    Gallstones    GERD (gastroesophageal reflux disease)    protonix, hx h. pylori   Herniated disc, cervical    History of hiatal hernia    History of sebaceous  adenoma    History of uterine cancer 04/22/2014   sees Dr. 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 Sharyn Lull in dermatology right upper arm   Obesity    OSA on CPAP    uses CPAP   Paroxysmal atrial fibrillation (HCC)    Peripheral vascular disease (HCC)    Personal history of radiation therapy    PONV (postoperative nausea and vomiting)    Recurrent UTI    S/P hip replacement    Scoliosis    Sleep apnea    Thyroid nodule    Urinary incontinence    Uterine cancer (HCC)    Stage 1   Venous insufficiency    s/p ablation    Past Surgical History:  Procedure Laterality Date   ABDOMINAL HYSTERECTOMY  09/10/2013   ATRIAL FIBRILLATION ABLATION N/A 08/09/2019   Procedure: ATRIAL FIBRILLATION ABLATION;  Surgeon: Regan Lemming, MD;  Location: [facility];  Service: Cardiovascular;  Laterality: N/A;   BREAST LUMPECTOMY WITH RADIOACTIVE SEED AND SENTINEL LYMPH NODE BIOPSY Left 02/16/2021   Procedure: LEFT BREAST LUMPECTOMY WITH RADIOACTIVE SEED X2 AND SENTINEL LYMPH NODE BIOPSY;  Surgeon: Harriette Bouillon, MD;  Location: [facility];  Service: General;  Laterality: Left;   BUNIONECTOMY  10/1976   CHOLECYSTECTOMY N/A 03/12/2019   Procedure: XI ROBOTIC ASSISTED CHOLECYSTECTOMY;  Surgeon: Berna Bue, MD;  Location: [facility];  Service: General;  Laterality: N/A;   COLONOSCOPY     COLONOSCOPY WITH PROPOFOL N/A 01/17/2020   Procedure: COLONOSCOPY WITH PROPOFOL;  Surgeon: Lynann Bologna,  MD;  Location: [facility];  Service: Endoscopy;  Laterality: N/A;   DRUG INDUCED ENDOSCOPY N/A 03/04/2020   Procedure: DRUG INDUCED ENDOSCOPY;  Surgeon: Christia Reading, MD;  Location: [facility];  Service: ENT;  Laterality: N/A;   FRACTURE SURGERY  09/1964   floor of orbit right side   HIATAL HERNIA REPAIR     IMPLANTATION OF HYPOGLOSSAL NERVE STIMULATOR Right 04/15/2020    Procedure: IMPLANTATION OF HYPOGLOSSAL NERVE STIMULATOR;  Surgeon: Christia Reading, MD;  Location: [facility];  Service: ENT;  Laterality: Right;   JOINT REPLACEMENT     bilateral hip replacements   MELANOMA EXCISION  12/2011   POLYPECTOMY  01/17/2020   Procedure: POLYPECTOMY;  Surgeon: Lynann Bologna, MD;  Location: [facility];  Service: Endoscopy;;   TOTAL HIP ARTHROPLASTY Left 06/17/2014   Procedure: LEFT TOTAL HIP ARTHROPLASTY ANTERIOR APPROACH;  Surgeon: Kathryne Hitch, MD;  Location: [facility];  Service: Orthopedics;  Laterality: Left;   TOTAL HIP ARTHROPLASTY Right 09/02/2014   Procedure: RIGHT TOTAL HIP ARTHROPLASTY ANTERIOR APPROACH;  Surgeon: Kathryne Hitch, MD;  Location: [facility];  Service: Orthopedics;  Laterality: Right;   VEIN SURGERY  05/2011   venous ablation      Current Outpatient Medications on File Prior to Visit  Medication Sig Dispense Refill   acetaminophen (TYLENOL) 500 MG tablet Take 2 tablets (1,000 mg total) by mouth every 6 (six) hours as needed.  0   apixaban (ELIQUIS) 5 MG TABS tablet TAKE 1 TABLET(5 MG) BY MOUTH TWICE DAILY. 180 tablet 1   Cranberry 500 MG TABS Take 1,000 mg by mouth 2 (two) times daily.     Cyanocobalamin (VITAMIN B 12) 500 MCG TABS Take 1 tablet by mouth daily.     diltiazem (CARDIZEM) 60 MG tablet Take 60 mg daily if needed for palpitations 30 tablet 9   diltiazem (TIAZAC) 240 MG 24 hr capsule TAKE 1 CAPSULE BY MOUTH EVERY DAY 90 capsule 3   famotidine (PEPCID) 20 MG tablet Take 20 mg by mouth 2 (two) times daily.     ibuprofen (ADVIL) 200 MG tablet Take 400 mg by mouth every 6 (six) hours as needed (pain).     letrozole (FEMARA) 2.5 MG tablet Take 1 tablet (2.5 mg total) by mouth daily. 90 tablet 3   OVER THE COUNTER MEDICATION Take 20 mg by mouth daily as needed (arthritis). CBD oil     polyethylene glycol (MIRALAX / GLYCOLAX) 17 g packet Take 17 g by mouth as needed (as directed).     sennosides-docusate sodium  (SENOKOT-S) 8.6-50 MG tablet Take 1 tablet by mouth as needed for constipation.     spironolactone (ALDACTONE) 50 MG tablet TAKE 2 TABLETS BY MOUTH EVERY MORNING AND 1 TABLET EVERY EVENING 270 tablet 3   triamcinolone cream (KENALOG) 0.1 % Apply 1 application topically daily as needed (for dry skin).   1   Vibegron (GEMTESA) 75 MG TABS Take 75 mg by mouth daily.     vitamin C (ASCORBIC ACID) 500 MG tablet Take 500 mg by mouth 2 (two) times daily.     No current facility-administered medications on file prior to visit.    Allergies  Allergen Reactions   Hydrolyzed Silk Hives and Other (See Comments)    Silk tape-blisters   Ciprofloxacin Other (See Comments)    Reactions with antiarrythmic Reactions with antiarrythmic   Gabapentin    Gluten Meal Other (See Comments)    reflux   Metoprolol  dizzy     DIAGNOSTIC DATA (LABS, IMAGING, TESTING) - I reviewed patient records, labs, notes, testing and imaging myself where available.  Lab Results  Component Value Date   WBC 4.6 01/03/2023   HGB 14.5 01/03/2023   HCT 43.0 01/03/2023   MCV 104.8 (H) 01/03/2023   PLT 226.0 01/03/2023      Component Value Date/Time   NA 133 (L) 01/03/2023 1107   NA 137 11/04/2022 0843   K 4.8 01/03/2023 1107   CL 98 01/03/2023 1107   CO2 27 01/03/2023 1107   GLUCOSE 92 01/03/2023 1107   BUN 19 01/03/2023 1107   BUN 16 11/04/2022 0843   CREATININE 0.79 01/03/2023 1107   CREATININE 0.88 06/22/2016 0930   CALCIUM 9.7 01/03/2023 1107   PROT 6.9 01/03/2023 1107   ALBUMIN 4.6 01/03/2023 1107   AST 22 01/03/2023 1107   ALT 26 01/03/2023 1107   ALKPHOS 78 01/03/2023 1107   BILITOT 0.7 01/03/2023 1107   GFRNONAA >60 02/10/2021 0900   GFRAA 89 08/07/2019 0825   Lab Results  Component Value Date   CHOL 148 01/03/2023   HDL 39.20 01/03/2023   LDLCALC 87 01/03/2023   TRIG 108.0 01/03/2023   CHOLHDL 4 01/03/2023   Lab Results  Component Value Date   HGBA1C 5.5 11/27/2020   Lab Results   Component Value Date   VITAMINB12 1,247 (H) 01/03/2023   Lab Results  Component Value Date   TSH 2.32 01/03/2023    PHYSICAL EXAM:  Today's Vitals   01/16/23 0918 01/16/23 0928  BP: (!) 142/65 130/60  Pulse: 72 70  Weight: 236 lb (107 kg)   Height: 5\' 8"  (1.727 m)    Body mass index is 35.88 kg/m.   Wt Readings from Last 3 Encounters:  01/16/23 236 lb (107 kg)  12/29/22 236 lb 8 oz (107.3 kg)  11/04/22 235 lb (106.6 kg)     Ht Readings from Last 3 Encounters:  01/16/23 5\' 8"  (1.727 m)  12/29/22 5\' 8"  (1.727 m)  11/04/22 5' 8.5" (1.74 m)      General: The patient is awake, alert and appears not in acute distress. The patient is well groomed. Head: Normocephalic, atraumatic. Neck is supple. Mallampati 2,  neck circumference:15.5 inches . Nasal airflow is patent.  Retrognathia is not seen.  Dental status: biological  Cardiovascular:  Regular rate and cardiac rhythm by pulse,  without distended neck veins. Respiratory: Lungs are clear to auscultation.  Skin:  Without evidence of ankle edema, or rash. Trunk: The patient's posture is erect.   NEUROLOGIC EXAM: The patient is awake and alert, oriented to place and time.   Memory subjective described as intact.  Attention span & concentration ability appears normal.  Speech is fluent,  without  dysarthria, dysphonia or aphasia.  Mood and affect are appropriate.   Cranial nerves: no loss of smell or taste reported   Hearing was intact to soft voice and finger rubbing.    Facial sensation intact to fine touch.  Facial motor strength is symmetric and tongue and uvula move midline.  Neck ROM : rotation, tilt and flexion extension were normal for age and shoulder shrug was symmetrical.     ASSESSMENT AND PLAN 72 y.o. year old female  here with: HYPOGLOSSAL NERVE STIMULATOR in the treatment of OSA, this patient was not using oxygen at any time.      1) Having taken a break form inspire while struggling with dental  pain.  We want  her to restart. She has a dry mouth. At  her tongue buckled but did not cross the teeth. The wave form was well formed.  We restart today at 1.6 V.  We meet in 6 weeks again and see how for she has titrated.   2) Insomnia is a chronic problem that is not related to apnea/ could it be related to pain and stimulus by Inspire?   3) No longer in atrial fib. !!! 4) BMI has increased : 36 !! It will be harder to have effective tongue motion when he rests in supine - even if reclined ( that helps a bit ).     I plan to follow up either personally or through our NP within 6-8 weeks  months.   I would like to thank Philip Aspen, Limmie Patricia, MD for allowing me to meet with and to take care of this pleasant patient. CC: I will share my notes with Dr. Jenne Pane.   After spending a total time of  30  minutes face to face and additional time for physical and neurologic examination, review of laboratory studies,  personal review of imaging studies, reports and results of other testing and review of referral information / records as far as provided in visit,   Electronically signed by: Melvyn Novas, MD 01/16/2023 9:47 AM  [facility] Board certified by The [employer] of Sleep Medicine and Diplomate of the [employer] of Sleep Medicine. Board certified In Neurology through the ABPN, Fellow of the [employer] of Neu[employer].

## 2023-01-23 ENCOUNTER — Encounter: Payer: Self-pay | Admitting: Sports Medicine

## 2023-01-23 DIAGNOSIS — J209 Acute bronchitis, unspecified: Secondary | ICD-10-CM | POA: Diagnosis not present

## 2023-01-24 ENCOUNTER — Ambulatory Visit: Payer: PPO | Admitting: Internal Medicine

## 2023-01-24 NOTE — Progress Notes (Unsigned)
Office Note     CC: Bilateral thigh pain Requesting Provider:  Philip Aspen, Estel*  HPI: [name] is a 73 y.o. (Jul 30, 1950) female who presents at the request of Philip Aspen, Limmie Patricia, MD for evaluation of bilateral thigh pain.  On exam, [name] is doing well.  Originally from [place], she moved to [place] to be closer to her 2 daughters.  She is integral in the care of her 5 grandchildren, 2 of which are twins.  [name] has a history of venous disease previously having undergone bilateral greater saphenous vein ablation in 2012.  She wears compression stockings religiously, and notes that if she does not wear these, she gets aching in the calves and ankles with significant swelling.  With compression, she is asymptomatic.  [name] denies history of claudication, ischemic rest pain, tissue loss.  She has appreciated pain in the anterior portion of the thighs when standing.  This dissipates when moving.  Denies paresthesias, motor weakness.  Has a history of lower back problems at baseline. Varicose veins have been present for a number of years, denies bleeding or ulceration. No prior DVT   Past Medical History:  Diagnosis Date   Chronic edema    Clotting disorder (HCC)    Constipation    Difficult intubation    pt states that her neck needs to be in a neutral position,  scoliosisand herniated dics in neck   Family history of colon cancer    Family history of uterine cancer    Gallstones    GERD (gastroesophageal reflux disease)    protonix, hx h. pylori   Herniated disc, cervical    History of hiatal hernia    History of sebaceous adenoma    History of uterine cancer 04/22/2014   sees Dr. 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 Sharyn Lull in dermatology right upper arm   Obesity    OSA on CPAP    uses CPAP   Paroxysmal atrial fibrillation (HCC)    Peripheral  vascular disease (HCC)    Personal history of radiation therapy    PONV (postoperative nausea and vomiting)    Recurrent UTI    S/P hip replacement    Scoliosis    Sleep apnea    Thyroid nodule    Urinary incontinence    Uterine cancer (HCC)    Stage 1   Venous insufficiency    s/p ablation    Past Surgical History:  Procedure Laterality Date   ABDOMINAL HYSTERECTOMY  09/10/2013   ATRIAL FIBRILLATION ABLATION N/A 08/09/2019   Procedure: ATRIAL FIBRILLATION ABLATION;  Surgeon: Regan Lemming, MD;  Location: [facility];  Service: Cardiovascular;  Laterality: N/A;   BREAST LUMPECTOMY WITH RADIOACTIVE SEED AND SENTINEL LYMPH NODE BIOPSY Left 02/16/2021   Procedure: LEFT BREAST LUMPECTOMY WITH RADIOACTIVE SEED X2 AND SENTINEL LYMPH NODE BIOPSY;  Surgeon: Harriette Bouillon, MD;  Location: [facility];  Service: General;  Laterality: Left;   BUNIONECTOMY  10/1976   CHOLECYSTECTOMY N/A 03/12/2019   Procedure: XI ROBOTIC ASSISTED CHOLECYSTECTOMY;  Surgeon: Berna Bue, MD;  Location: [facility];  Service: General;  Laterality: N/A;   COLONOSCOPY     COLONOSCOPY WITH PROPOFOL N/A 01/17/2020   Procedure: COLONOSCOPY WITH PROPOFOL;  Surgeon: Lynann Bologna, MD;  Location: [facility];  Service: Endoscopy;  Laterality: N/A;   DRUG INDUCED ENDOSCOPY  N/A 03/04/2020   Procedure: DRUG INDUCED ENDOSCOPY;  Surgeon: Christia Reading, MD;  Location: [facility];  Service: ENT;  Laterality: N/A;   FRACTURE SURGERY  09/1964   floor of orbit right side   HIATAL HERNIA REPAIR     IMPLANTATION OF HYPOGLOSSAL NERVE STIMULATOR Right 04/15/2020   Procedure: IMPLANTATION OF HYPOGLOSSAL NERVE STIMULATOR;  Surgeon: Christia Reading, MD;  Location: [facility];  Service: ENT;  Laterality: Right;   JOINT REPLACEMENT     bilateral hip replacements   MELANOMA EXCISION  12/2011   POLYPECTOMY  01/17/2020   Procedure: POLYPECTOMY;  Surgeon: Lynann Bologna, MD;  Location: [facility];  Service: Endoscopy;;   TOTAL HIP ARTHROPLASTY Left 06/17/2014   Procedure: LEFT TOTAL HIP ARTHROPLASTY ANTERIOR APPROACH;  Surgeon: Kathryne Hitch, MD;  Location: [facility];  Service: Orthopedics;  Laterality: Left;   TOTAL HIP ARTHROPLASTY Right 09/02/2014   Procedure: RIGHT TOTAL HIP ARTHROPLASTY ANTERIOR APPROACH;  Surgeon: Kathryne Hitch, MD;  Location: [facility];  Service: Orthopedics;  Laterality: Right;   VEIN SURGERY  05/2011   venous ablation     Social History   Socioeconomic History   Marital status: Divorced    Spouse name: Not on file   Number of children: 2   Years of education: JD   Highest education level: Professional school degree (e.g., MD, DDS, DVM, JD)  Occupational History   Occupation: lawyer/RETIRED  Tobacco Use   Smoking status: Never   Smokeless tobacco: Never  Vaping Use   Vaping status: Never Used  Substance and Sexual Activity   Alcohol use: Not Currently    Comment: rarely at [facility]    Drug use: No   Sexual activity: Not Currently    Birth control/protection: Surgical  Other Topics Concern   Not on file  Social History Narrative   Work or School: retired Pensions consultant      Home Situation: lives alone - takes care of twin grandchildren 7 months in 05/2015      Spiritual Beliefs:       Lifestyle: active, healthy diet      Social Determinants of Health   Financial Resource Strain: Low Risk  (12/29/2022)   Overall Financial Resource Strain (CARDIA)    Difficulty of Paying Living Expenses: Not very hard  Food Insecurity: No Food Insecurity (12/29/2022)   Hunger Vital Sign    Worried About Running Out of Food in the Last Year: Never true    Ran Out of Food in the Last Year: Never true  Transportation Needs: No Transportation Needs (12/29/2022)   PRAPARE - Administrator, Civil Service (Medical): No    Lack of Transportation (Non-Medical): No  Physical Activity: Sufficiently Active (12/29/2022)   Exercise Vital Sign     Days of Exercise per Week: 7 days    Minutes of Exercise per Session: 30 min  Stress: No Stress Concern Present (12/29/2022)   [employer] of Occupational Health - Occupational Stress Questionnaire    Feeling of Stress : Only a little  Social Connections: Moderately Integrated (12/29/2022)   Social Connection and Isolation Panel [NHANES]    Frequency of Communication with Friends and Family: More than three times a week    Frequency of Social Gatherings with Friends and Family: More than three times a week    Attends Religious Services: More than 4 times per year    Active Member of [employer] or Organizations: Yes    Attends Club or  Organization Meetings: More than 4 times per year    Marital Status: Divorced  Catering manager Violence: Not on file   Family History  Problem Relation Age of Onset   Uterine cancer Mother    Emphysema Mother    Diabetes Brother 7   Hypertension Father 50   Hyperlipidemia Father    Epilepsy Father 57   Colon cancer Father 19   Leukemia Father    Arthritis Sister 23   Cancer Maternal Grandmother        unk type possibly breast or skin   Other Paternal Grandmother        influenza    Stroke Paternal Grandfather    Cancer Paternal Uncle        unk type   Skin cancer Daughter        SCC on back   Esophageal cancer Neg Hx    Rectal cancer Neg Hx    Stomach cancer Neg Hx     Current Outpatient Medications  Medication Sig Dispense Refill   acetaminophen (TYLENOL) 500 MG tablet Take 2 tablets (1,000 mg total) by mouth every 6 (six) hours as needed.  0   apixaban (ELIQUIS) 5 MG TABS tablet TAKE 1 TABLET(5 MG) BY MOUTH TWICE DAILY. 180 tablet 1   Cranberry 500 MG TABS Take 1,000 mg by mouth 2 (two) times daily.     Cyanocobalamin (VITAMIN B 12) 500 MCG TABS Take 1 tablet by mouth daily.     diltiazem (CARDIZEM) 60 MG tablet Take 60 mg daily if needed for palpitations 30 tablet 9   diltiazem (TIAZAC) 240 MG 24 hr capsule TAKE 1 CAPSULE BY MOUTH  EVERY DAY 90 capsule 3   famotidine (PEPCID) 20 MG tablet Take 20 mg by mouth 2 (two) times daily.     ibuprofen (ADVIL) 200 MG tablet Take 400 mg by mouth every 6 (six) hours as needed (pain).     letrozole (FEMARA) 2.5 MG tablet Take 1 tablet (2.5 mg total) by mouth daily. 90 tablet 3   OVER THE COUNTER MEDICATION Take 20 mg by mouth daily as needed (arthritis). CBD oil     polyethylene glycol (MIRALAX / GLYCOLAX) 17 g packet Take 17 g by mouth as needed (as directed).     sennosides-docusate sodium (SENOKOT-S) 8.6-50 MG tablet Take 1 tablet by mouth as needed for constipation.     spironolactone (ALDACTONE) 50 MG tablet TAKE 2 TABLETS BY MOUTH EVERY MORNING AND 1 TABLET EVERY EVENING 270 tablet 3   triamcinolone cream (KENALOG) 0.1 % Apply 1 application topically daily as needed (for dry skin).   1   Vibegron (GEMTESA) 75 MG TABS Take 75 mg by mouth daily.     vitamin C (ASCORBIC ACID) 500 MG tablet Take 500 mg by mouth 2 (two) times daily.     No current facility-administered medications for this visit.    Allergies  Allergen Reactions   Hydrolyzed Silk Hives and Other (See Comments)    Silk tape-blisters   Ciprofloxacin Other (See Comments)    Reactions with antiarrythmic Reactions with antiarrythmic   Gabapentin    Gluten Meal Other (See Comments)    reflux   Metoprolol     dizzy     REVIEW OF SYSTEMS:  [X]  denotes positive finding, [ ]  denotes negative finding Cardiac  Comments:  Chest pain or chest pressure:    Shortness of breath upon exertion:    Short of breath when lying flat:    Irregular  heart rhythm:        Vascular    Pain in calf, thigh, or hip brought on by ambulation:    Pain in feet at night that wakes you up from your sleep:     Blood clot in your veins:    Leg swelling:         Pulmonary    Oxygen at home:    Productive cough:     Wheezing:         Neurologic    Sudden weakness in arms or legs:     Sudden numbness in arms or legs:     Sudden  onset of difficulty speaking or slurred speech:    Temporary loss of vision in one eye:     Problems with dizziness:         Gastrointestinal    Blood in stool:     Vomited blood:         Genitourinary    Burning when urinating:     Blood in urine:        Psychiatric    Major depression:         Hematologic    Bleeding problems:    Problems with blood clotting too easily:        Skin    Rashes or ulcers:        Constitutional    Fever or chills:      PHYSICAL EXAMINATION:  There were no vitals filed for this visit.  General:  WDWN in NAD; vital signs documented above Gait: Not observed HENT: WNL, normocephalic Pulmonary: normal non-labored breathing , without Rales, rhonchi,  wheezing Cardiac: regular HR Abdomen: soft, NT, no masses Skin: without rashes Vascular Exam/Pulses:  Right Left  Radial 2+ (normal) 2+ (normal)  Ulnar    Femoral    Popliteal    DP 2+ (normal) 2+ (normal)  PT 2+ (normal) 2+ (normal)   Extremities: without ischemic changes, without Gangrene , without cellulitis; without open wounds;  Musculoskeletal: no muscle wasting or atrophy  Neurologic: A&O X 3;  No focal weakness or paresthesias are detected Psychiatric:  The pt has Normal affect.   Non-Invasive Vascular Imaging:   Venous Reflux Times  +-----------------+---------+------+----------+------------+---------------  ----+  RIGHT           Reflux NoReflux  Reflux  Diameter cmsComments                                         Yes     Time                                     +-----------------+---------+------+----------+------------+---------------  ----+  CFV                       yes  >1 second                                   +-----------------+---------+------+----------+------------+---------------  ----+  FV prox          no                                                          +-----------------+---------+------+----------+------------+---------------  ----+  FV mid           no                                   duplicated  vein      +-----------------+---------+------+----------+------------+---------------  ----+  FV dist          no                                   duplicated  vein      +-----------------+---------+------+----------+------------+---------------  ----+  GSV at SFJ                 yes   >500 ms      0.72                          +-----------------+---------+------+----------+------------+---------------  ----+  GSV prox thigh             yes   >500 ms      0.85                          +-----------------+---------+------+----------+------------+---------------  ----+  GSV mid thigh                                         prior                                                                        ablation/stripping   +-----------------+---------+------+----------+------------+---------------  ----+  GSV dist thigh                                        prior                                                                        ablation/stripping   +-----------------+---------+------+----------+------------+---------------  ----+  GSV at knee                                           prior                                                                        ablation/stripping   +-----------------+---------+------+----------+------------+---------------  ----+  GSV prox  calf                                         prior                                                                        ablation/stripping   +-----------------+---------+------+----------+------------+---------------  ----+  SSV Pop Fossa                                         NWV                    +-----------------+---------+------+----------+------------+---------------  ----+  PFV                       yes  > 1 second                                  +-----------------+---------+------+----------+------------+---------------  ----+  Prx-mid thigh              yes   >500 ms      0.65    tortuous              varicosity                                                                  +-----------------+---------+------+----------+------------+---------------  ----+  mid-dist thigh             yes   >500 ms              tortuous              varicosity                                                                  +-----------------+---------+------+----------+------------+---------------  ----+        Summary:  Right:  - No evidence of deep vein thrombosis seen in the right lower extremity,  from the common femoral through the popliteal veins.  - Venous reflux is noted in the right common femoral vein.  - Venous reflux is noted in the right sapheno-femoral junction.  - Venous reflux is noted in the right greater saphenous vein in the thigh.  - Venous reflux is noted in the right profunda femoral vein.  - Venous reflux is noted in the right thigh varicose veins.    - The prox to mid thigh GSV branches to mutliple varicosities and is no  longer able to be visualized. Patient reports  hx of GSV radio frequency  ablation several years ago. The small saphenous vein could not be  visualized.    *See table(s) above for measurements and observations.      ASSESSMENT/PLAN:: 72 y.o. female presenting with thigh pain when standing.  Previous history includes bilateral greater saphenous vein ablation. On physical exam, bilateral lower extremity varicosities were appreciated as well as some edema.  Denied lower extremity heaviness, aching with compression use.  Venous reflux study demonstrated reflux throughout the right leg in the deep and  superficial systems.  The greater saphenous vein in the mid thigh and knee remains closed.  I do not think that her anterior thigh pain is related to venous insufficiency.  This would present as aching that would not subside with ambulation, and would worsen throughout the day.  No pain at the site of varicosities. Pulses normal bilaterally, no concern for PAD at this time.  I had a long conversation with Conchetta regarding the importance of continuing compression and elevation.  With the greater saphenous vein ablated, there are no other interventions that I could offer except for repeat stab phlebectomy should she have ulcerations or bleeding issues.  She does not have any at this time. Recommend exploring other etiologies for her anterior thigh pain.   Victorino Sparrow, MD [facility] [phone]

## 2023-01-25 ENCOUNTER — Ambulatory Visit: Payer: PPO | Admitting: Sports Medicine

## 2023-01-25 DIAGNOSIS — S46811A Strain of other muscles, fascia and tendons at shoulder and upper arm level, right arm, initial encounter: Secondary | ICD-10-CM | POA: Diagnosis not present

## 2023-01-25 DIAGNOSIS — J40 Bronchitis, not specified as acute or chronic: Secondary | ICD-10-CM | POA: Diagnosis not present

## 2023-01-25 DIAGNOSIS — M75101 Unspecified rotator cuff tear or rupture of right shoulder, not specified as traumatic: Secondary | ICD-10-CM

## 2023-01-25 DIAGNOSIS — G8929 Other chronic pain: Secondary | ICD-10-CM

## 2023-01-25 DIAGNOSIS — M25511 Pain in right shoulder: Secondary | ICD-10-CM

## 2023-01-25 MED ORDER — LIDOCAINE HCL 1 % IJ SOLN
2.0000 mL | INTRAMUSCULAR | Status: AC | PRN
  Administered 2023-01-25: 2 mL

## 2023-01-25 MED ORDER — METHYLPREDNISOLONE ACETATE 40 MG/ML IJ SUSP
40.0000 mg | INTRAMUSCULAR | Status: AC | PRN
  Administered 2023-01-25: 40 mg via INTRA_ARTICULAR

## 2023-01-25 MED ORDER — BUPIVACAINE HCL 0.25 % IJ SOLN
2.0000 mL | INTRAMUSCULAR | Status: AC | PRN
  Administered 2023-01-25: 2 mL via INTRA_ARTICULAR

## 2023-01-25 NOTE — Progress Notes (Signed)
[name] - 72 y.o. female MRN [id number]  Date of birth: 1950-05-14  Office Visit Note: Visit Date: 01/25/2023 PCP: Philip Aspen, Limmie Patricia, MD Referred by: Philip Aspen, Estel*  Subjective: Chief Complaint  Patient presents with   Right Shoulder - Pain    SAJ Injection   HPI: [name] is a pleasant 72 y.o. female who presents today for acute on chronic right shoulder pain.  She has been seen in our clinic for the last well for her right shoulder both by myself as well as Dr. Magnus Ivan.  Back in May, I did ultrasound her shoulder which did appear to show a full-thickness tear of the supraspinatus and high-grade tearing of the infraspinatus as well.  She has been some PT/rehab in the past. Performed SAJ injection back on 08/04/22 - had great relief from this.  She has been involved doing chair yoga and was doing a lot of reaching which she feels like exacerbated the shoulder and over the last 2 weeks or so her shoulder has been very bothersome.  She was just recently diagnosed with bronchitis and so she is taking oral prednisone 20 mg twice daily x 1 week.  Pertinent ROS were reviewed with the patient and found to be negative unless otherwise specified above in HPI.   Assessment & Plan: Visit Diagnoses:  1. Chronic right shoulder pain   2. Tear of right supraspinatus tendon   3. Tear of right infraspinatus tendon, initial encounter   4. Bronchitis    Plan: Impression is acute exacerbation of her chronic left shoulder pain with rotator cuff arthropathy and ultrasound confirmed right supraspinatus tear with high-grade partial tearing of the infraspinatus tendon.  This is likely exacerbated given her reaching activities with her chair yoga, etc.  Back in May she did very well from a subacromial joint injection as well as some guided physical therapy.  Through shared decision making, we did repeat subacromial injection today.  We discussed getting back into rehab exercises,  she would like to do this at home and be consistent with rehab.  We did print out a customized rotator cuff and shoulder stabilization handout.  My further screening Isabelle Course did review these with her in the room today.  She will start these this upcoming Monday and may perform once daily.  We will see how she responds to the injection as well as her rehab over the next 6 weeks and she will follow-up as needed if not improving.  She is being treated for concomitant bronchitis, she is on prednisone 20 mg twice daily, I would like her just to hold her evening dose today given the corticosteroid injection and then she may continue resuming her normal prescription starting tomorrow.  She is agreeable and understanding to this plan.  Follow-up: Return if symptoms worsen or fail to improve.   Meds & Orders: No orders of the defined types were placed in this encounter.   Orders Placed This Encounter  Procedures   Large Joint Inj: R subacromial bursa     Procedures: Large Joint Inj: R subacromial bursa on 01/25/2023 9:53 AM Indications: pain Details: 22 G 1.5 in needle, posterior approach Medications: 2 mL lidocaine 1 %; 2 mL bupivacaine 0.25 %; 40 mg methylPREDNISolone acetate 40 MG/ML Outcome: tolerated well, no immediate complications  Subacromial Joint Injection, Right Shoulder After discussion on risks/benefits/indications, informed verbal consent was obtained. A timeout was then performed. Patient was seated on table in exam room. The patient's shoulder was  prepped with betadine and alcohol swabs and utilizing posterior approach a 22G, 1.5" needle was directed anteriorly and laterally into the patient's subacromial space was injected with 2:2:1 mixture of lidocaine:bupivicaine:depomedrol with appreciation of free-flowing of the injectate into the bursal space. Patient tolerated the procedure well without immediate complications.   Procedure, treatment alternatives, risks and benefits explained,  specific risks discussed. Consent was given by the patient. Immediately prior to procedure a time out was called to verify the correct patient, procedure, equipment, support staff and site/side marked as required. Patient was prepped and draped in the usual sterile fashion.          Clinical History: No specialty comments available.  She reports that she has never smoked. She has never used smokeless tobacco. No results for input(s): "HGBA1C", "LABURIC" in the last 8760 hours.  Objective:    Physical Exam  Gen: Well-appearing, in no acute distress; non-toxic CV:  Well-perfused. Warm.  Resp: Breathing unlabored on room air; no wheezing. Psych: Fluid speech in conversation; appropriate affect; normal thought process Neuro: Sensation intact throughout. No gross coordination deficits.   Ortho Exam - Right shoulder: There is full active and passive range of motion although pain at endrange abduction and forward flexion.  There is pain at Codman's point.  There is some pain and associated weakness with resisted abduction and ER.  Imaging:  - Previous US imaging:   [facility] Complete US of Shoulder, Right   Patient was seated on exam table and shoulder US examination was performed  using high frequency linear probe.  -Biceps tendon was seen within the bicipital groove and long in terms with  access with mild degenerative changes but no high-grade tearing.  -Subscapularis tendon was visualized with insertion on the lesser tubercle  without evidence of high-grade tearing, mild tendinopathic changes.  -The supraspinatus was visualized in longitudinal and transverse views,  there is calcification noted at the mid and distal insertion.  The  critical zone.  There is also cortical irregularity noted here suggestive  of prior injury.  When spending the first [place] there is an area of  hypoechoic change that is consistent with full-thickness tearing, this was  measured with about 1.6 cm of  retraction.  -The infraspinatus tendon was visualized posteriorly with evidence of  tearing with hypoechoic change and calcification within the critical zone  as a approach used the insertion near the supraspinatus.  Difficult to  ascertain the degree of tearing but certainly seems to be high-grade in  nature.  Teres minor was visualized without notable pathology.  -AC joint with moderate arthritic change without bursal distention.   Posterior glenohumeral joint seen without effusion, degenerative labrum  noted.   Past Medical/Family/Surgical/Social History: Medications & Allergies reviewed per EMR, new medications updated. Patient Active Problem List   Diagnosis Date Noted   S/P placement of hypoglossal nerve stimulator 01/16/2023   Macrocytosis 12/28/2021   Malignant neoplasm of upper-outer quadrant of left breast in female, estrogen receptor positive (HCC) 01/07/2021   Vitamin D deficiency 12/01/2020   Intolerance of continuous positive airway pressure (CPAP) ventilation 05/13/2020   Severe obstructive sleep apnea-hypopnea syndrome 05/13/2020   Hx of colonic polyps    Adenomatous polyp of ascending colon    Genetic testing 06/24/2019   Family history of uterine cancer    Family history of colon cancer    History of uterine cancer    History of sebaceous adenoma    S/P repair of paraesophageal hernia 03/12/2019   Syncope 02/27/2018  Community acquired pneumonia 02/10/2018   CAP (community acquired pneumonia) 02/10/2018   Long term current use of antiarrhythmic drug 11/02/2015   Varicose veins 11/02/2015   OAB (overactive bladder) 09/07/2015   History of melanoma 06/10/2015   Thyroid nodule 06/10/2015   GERD (gastroesophageal reflux disease) 05/25/2015   Chronic venous insufficiency 05/25/2015   Hx of essential hypertension 05/25/2015   Atrial fibrillation (HCC) 04/22/2014   Osteoarthritis, hip, bilateral 03/25/2014   Lumbar and sacral osteoarthritis 12/10/2013   Past  Medical History:  Diagnosis Date   Chronic edema    Clotting disorder (HCC)    Constipation    Difficult intubation    pt states that her neck needs to be in a neutral position,  scoliosisand herniated dics in neck   Family history of colon cancer    Family history of uterine cancer    Gallstones    GERD (gastroesophageal reflux disease)    protonix, hx h. pylori   Herniated disc, cervical    History of hiatal hernia    History of sebaceous adenoma    History of uterine cancer 04/22/2014   sees Dr. 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 Sharyn Lull in dermatology right upper arm   Obesity    OSA on CPAP    uses CPAP   Paroxysmal atrial fibrillation (HCC)    Peripheral vascular disease (HCC)    Personal history of radiation therapy    PONV (postoperative nausea and vomiting)    Recurrent UTI    S/P hip replacement    Scoliosis    Sleep apnea    Thyroid nodule    Urinary incontinence    Uterine cancer (HCC)    Stage 1   Venous insufficiency    s/p ablation   Family History  Problem Relation Age of Onset   Uterine cancer Mother    Emphysema Mother    Diabetes Brother 57   Hypertension Father 48   Hyperlipidemia Father    Epilepsy Father 34   Colon cancer Father 73   Leukemia Father    Arthritis Sister 54   Cancer Maternal Grandmother        unk type possibly breast or skin   Other Paternal Grandmother        influenza    Stroke Paternal Grandfather    Cancer Paternal Uncle        unk type   Skin cancer Daughter        SCC on back   Esophageal cancer Neg Hx    Rectal cancer Neg Hx    Stomach cancer Neg Hx    Past Surgical History:  Procedure Laterality Date   ABDOMINAL HYSTERECTOMY  09/10/2013   ATRIAL FIBRILLATION ABLATION N/A 08/09/2019   Procedure: ATRIAL FIBRILLATION ABLATION;  Surgeon: Regan Lemming, MD;  Location: [facility];  Service:  Cardiovascular;  Laterality: N/A;   BREAST LUMPECTOMY WITH RADIOACTIVE SEED AND SENTINEL LYMPH NODE BIOPSY Left 02/16/2021   Procedure: LEFT BREAST LUMPECTOMY WITH RADIOACTIVE SEED X2 AND SENTINEL LYMPH NODE BIOPSY;  Surgeon: Harriette Bouillon, MD;  Location: [facility];  Service: General;  Laterality: Left;   BUNIONECTOMY  10/1976   CHOLECYSTECTOMY N/A 03/12/2019   Procedure: XI ROBOTIC ASSISTED CHOLECYSTECTOMY;  Surgeon: Berna Bue, MD;  Location: [facility];  Service: General;  Laterality: N/A;   COLONOSCOPY     COLONOSCOPY  WITH PROPOFOL N/A 01/17/2020   Procedure: COLONOSCOPY WITH PROPOFOL;  Surgeon: Lynann Bologna, MD;  Location: [facility];  Service: Endoscopy;  Laterality: N/A;   DRUG INDUCED ENDOSCOPY N/A 03/04/2020   Procedure: DRUG INDUCED ENDOSCOPY;  Surgeon: Christia Reading, MD;  Location: [facility];  Service: ENT;  Laterality: N/A;   FRACTURE SURGERY  09/1964   floor of orbit right side   HIATAL HERNIA REPAIR     IMPLANTATION OF HYPOGLOSSAL NERVE STIMULATOR Right 04/15/2020   Procedure: IMPLANTATION OF HYPOGLOSSAL NERVE STIMULATOR;  Surgeon: Christia Reading, MD;  Location: [facility];  Service: ENT;  Laterality: Right;   JOINT REPLACEMENT     bilateral hip replacements   MELANOMA EXCISION  12/2011   POLYPECTOMY  01/17/2020   Procedure: POLYPECTOMY;  Surgeon: Lynann Bologna, MD;  Location: [facility];  Service: Endoscopy;;   TOTAL HIP ARTHROPLASTY Left 06/17/2014   Procedure: LEFT TOTAL HIP ARTHROPLASTY ANTERIOR APPROACH;  Surgeon: Kathryne Hitch, MD;  Location: [facility];  Service: Orthopedics;  Laterality: Left;   TOTAL HIP ARTHROPLASTY Right 09/02/2014   Procedure: RIGHT TOTAL HIP ARTHROPLASTY ANTERIOR APPROACH;  Surgeon: Kathryne Hitch, MD;  Location: [facility];  Service: Orthopedics;  Laterality: Right;   VEIN SURGERY  05/2011   venous ablation    Social History   Occupational History   Occupation: lawyer/RETIRED  Tobacco Use    Smoking status: Never   Smokeless tobacco: Never  Vaping Use   Vaping status: Never Used  Substance and Sexual Activity   Alcohol use: Not Currently    Comment: rarely at [facility]    Drug use: No   Sexual activity: Not Currently    Birth control/protection: Surgical

## 2023-01-25 NOTE — Progress Notes (Signed)
Patient says that her shoulder has been bothering her again for about 10 days and is mostly aching. She says that she is taking Prednisone currently for bronchitis and that seems to be also taking the edge off of some of the shoulder pain, but not completely.  Patient was instructed in 10 minutes of therapeutic exercises for right shoulder to improve strength, ROM and function according to my instructions and plan of care by a Certified Athletic Trainer during the office visit. A customized handout was provided and demonstration of proper technique shown and discussed. Patient did perform exercises and demonstrate understanding through teachback.  All questions discussed and answered.

## 2023-01-26 ENCOUNTER — Encounter: Payer: Self-pay | Admitting: Vascular Surgery

## 2023-01-26 ENCOUNTER — Ambulatory Visit: Payer: PPO | Admitting: Vascular Surgery

## 2023-01-26 VITALS — BP 146/79 | HR 72 | Temp 97.9°F | Resp 20 | Ht 68.0 in | Wt 230.0 lb

## 2023-01-26 DIAGNOSIS — I872 Venous insufficiency (chronic) (peripheral): Secondary | ICD-10-CM

## 2023-02-07 DIAGNOSIS — L570 Actinic keratosis: Secondary | ICD-10-CM | POA: Diagnosis not present

## 2023-02-07 DIAGNOSIS — L814 Other melanin hyperpigmentation: Secondary | ICD-10-CM | POA: Diagnosis not present

## 2023-02-07 DIAGNOSIS — L578 Other skin changes due to chronic exposure to nonionizing radiation: Secondary | ICD-10-CM | POA: Diagnosis not present

## 2023-02-07 DIAGNOSIS — Z85820 Personal history of malignant melanoma of skin: Secondary | ICD-10-CM | POA: Diagnosis not present

## 2023-02-07 DIAGNOSIS — L821 Other seborrheic keratosis: Secondary | ICD-10-CM | POA: Diagnosis not present

## 2023-02-07 DIAGNOSIS — D225 Melanocytic nevi of trunk: Secondary | ICD-10-CM | POA: Diagnosis not present

## 2023-02-27 ENCOUNTER — Encounter: Payer: Self-pay | Admitting: Neurology

## 2023-02-27 ENCOUNTER — Ambulatory Visit: Payer: PPO | Admitting: Neurology

## 2023-02-27 ENCOUNTER — Telehealth: Payer: Self-pay | Admitting: Neurology

## 2023-02-27 ENCOUNTER — Encounter: Payer: Self-pay | Admitting: Internal Medicine

## 2023-02-27 VITALS — BP 139/71 | HR 84 | Ht 69.0 in | Wt 229.0 lb

## 2023-02-27 DIAGNOSIS — M47817 Spondylosis without myelopathy or radiculopathy, lumbosacral region: Secondary | ICD-10-CM

## 2023-02-27 DIAGNOSIS — Z9682 Presence of neurostimulator: Secondary | ICD-10-CM | POA: Diagnosis not present

## 2023-02-27 DIAGNOSIS — N3281 Overactive bladder: Secondary | ICD-10-CM | POA: Diagnosis not present

## 2023-02-27 NOTE — Progress Notes (Addendum)
Provider:  Melvyn Novas, MD  Primary Care Physician:  [name], Shelly Patricia, MD [address] [place] [address]     Referring Provider: Philip [name], Shelly Patricia, Md [address] [place],  [place] [address]          Chief Complaint according to patient   Patient presents with:     New Patient (Initial Visit)           HISTORY OF PRESENT ILLNESS:  [name] is a 72 y.o. female patient who is here for revisit 02/27/2023 for  INSPIRE follow up. Chronic insomnia, used to have frequent nocturia, recent bronchitis, sciatica pain. In October/ november there were dental problems. Chief concern  :  patient reports she has scoliosis,  sciatica and the pain interferes with sleep. Left knee pain, and she is still waking up 4-5 times most night. She couldn't use the remote to increase stimulation. She uses the device every day but only uses it for 5 hours , because that's when sleep ends.  She remained at V 1.6 and was here rechecked. The remote was reset to 1.7V. she still is overcoming bronchitis.  I urged her to see her orthopedist to discuss non surgical therapy options.   She can benefit from a muscle relaxant, this may increase overall sleep time as well.     [name] is a 38 -year- old Caucasian female patient was seen here  on 08/26/2020. She has currently a UTI and her sleep has been affected. Sleep is better in the sense that she can sleep for intervals of up to three hours , interrupted by bathroom breaks. Her  higher V setting became uncomfortable. The most comfortable setting is 1.7 V now, but its not the most therapeutic.  She did not bring her remote control to this visit (!) . Her tongue motion was noticed at 1.7/ 1.8 V and very strong "buckling" of the tongue- at 1.9V that is now uncomfortable. I an attempt to get a smoother tongue movement we will reduce the Voltage- 0.9V, which was felt as smoother,  then 1.0 V- felt very  tolerable . Then 1.2 V-felt OK , tongue is moving.  Then 1.4 V - visible tongue motion, stronger but OK.   This follows a titration on INSPIRE device  08-19-2020.  We were able to establish baseline apnea at 0 VOLT for 92 minutes with an AHI of 48/h. ( SEVERE baseline apnea )  1.         Titration allowed to reduce the severity to Moderate -severe Sleep Apnea/ Hypopnea - at 1.9V there was still an AHI of 16.8/h noted and events spared REM sleep, indicating a central apnea is likely present as well as hypopnea.  2.         The majority of AHI events occurred I supine sleep.   3.         Hypoxemia also persisted and required 1 liter additional oxygen, see attached screenshot.  4.         Regular rhythm by EKG during this study.    RECOMMENDATIONS:  [name] will not treat central apneas or hypoxemia, but reduced the AHI to 16.8 from 48.3/h. This is at the current setting of 1.9 V. The patient required oxygen during this titration study, but I cannot order oxygen based on this study- INSPIRE will not correct hypoxemia. This is a partial treatment and the patient  can benefit from weight loss to reduce the AHI further. She is advised to avoid the supine sleep position, which reduced the AHI to below 10/h.       Appointment Therapist, nutritional ) (Newest Message First) View All Conversations on this Encounter Linward Natal routed conversation to You; Irene Pap Patrice Paradise (10:51 AM)   Rutha Bouchard (10:51 AM)   EE Patient came in for an inspire follow up.                               Note       Recent Patient Communication   Last Update Description Specialty    Yesterday Sciatic pain Family Medicine    [facility] [name], Estela Y Open    Yesterday Appointment [name] ) Sleep Medicine    [facility] [place], [place] Closed    1 month ago Steroid Injection Orthopedics    [facility] [place], [place] Open    1 month ago Appointment [name]  ) Sleep Medicine    [facility] [place] Closed     Review of Systems: Out of a complete 14 system review, the patient complains of only the following symptoms, and all other reviewed systems are negative.:  Fatigue, sleepiness , snoring, fragmented sleep, Insomnia, chronic pain Nocturia    How likely are you to doze in the following situations: 0 = not likely, 1 = slight chance, 2 = moderate chance, 3 = high chance   Sitting and Reading? Watching Television? Sitting inactive in a public place (theater or meeting)? As a passenger in a car for an hour without a break? Lying down in the afternoon when circumstances permit? Sitting and talking to someone? Sitting quietly after lunch without alcohol? In a car, while stopped for a few minutes in traffic?   NA - unchanged since last visit. 9/ 24.  Social History   Socioeconomic History   Marital status: Divorced    Spouse name: Not on file   Number of children: 2   Years of education: JD   Highest education level: Professional school degree (e.g., MD, DDS, DVM, JD)  Occupational History   Occupation: lawyer/RETIRED  Tobacco Use   Smoking status: Never   Smokeless tobacco: Never  Vaping Use   Vaping status: Never Used  Substance and Sexual Activity   Alcohol use: Not Currently    Comment: rarely at [facility]    Drug use: No   Sexual activity: Not Currently    Birth control/protection: Surgical  Other Topics Concern   Not on file  Social History Narrative   Work or School: retired Pensions consultant      Home Situation: lives alone - takes care of twin grandchildren 7 months in 05/2015      Spiritual Beliefs:       Lifestyle: active, healthy diet      Pt lives alone    Social Drivers of Health   Financial Resource Strain: Low Risk  (12/29/2022)   Overall Financial Resource Strain (CARDIA)    Difficulty of Paying Living Expenses: Not very hard  Food Insecurity: No Food Insecurity (12/29/2022)   Hunger  Vital Sign    Worried About Running Out of Food in the Last Year: Never true    Ran Out of Food in the Last Year: Never true  Transportation Needs: No Transportation Needs (12/29/2022)   PRAPARE - Administrator, Civil Service (Medical): No  Lack of Transportation (Non-Medical): No  Physical Activity: Sufficiently Active (12/29/2022)   Exercise Vital Sign    Days of Exercise per Week: 7 days    Minutes of Exercise per Session: 30 min  Stress: No Stress Concern Present (12/29/2022)   [employer] of Occupational Health - Occupational Stress Questionnaire    Feeling of Stress : Only a little  Social Connections: Moderately Integrated (12/29/2022)   Social Connection and Isolation Panel [NHANES]    Frequency of Communication with Friends and Family: More than three times a week    Frequency of Social Gatherings with Friends and Family: More than three times a week    Attends Religious Services: More than 4 times per year    Active Member of Clubs or Organizations: Yes    Attends Engineer, structural: More than 4 times per year    Marital Status: Divorced    Family History  Problem Relation Age of Onset   Uterine cancer Mother    Emphysema Mother    Hypertension Father 55   Hyperlipidemia Father    Epilepsy Father 45   Colon cancer Father 47   Leukemia Father    Arthritis Sister 32   Diabetes Brother 73   Cancer Paternal Uncle        unk type   Cancer Maternal Grandmother        unk type possibly breast or skin   Other Paternal Grandmother        influenza    Stroke Paternal Grandfather    Skin cancer Daughter        SCC on back   Esophageal cancer Neg Hx    Rectal cancer Neg Hx    Stomach cancer Neg Hx    Sleep apnea Neg Hx     Past Medical History:  Diagnosis Date   Chronic edema    Clotting disorder (HCC)    Constipation    Difficult intubation    pt states that her neck needs to be in a neutral position,  scoliosisand herniated dics  in neck   Family history of colon cancer    Family history of uterine cancer    Gallstones    GERD (gastroesophageal reflux disease)    protonix, hx h. pylori   Herniated disc, cervical    History of hiatal hernia    History of sebaceous adenoma    History of uterine cancer 04/22/2014   sees Dr. 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 Sharyn Lull in dermatology right upper arm   Obesity    OSA on CPAP    uses CPAP   Paroxysmal atrial fibrillation (HCC)    Peripheral vascular disease (HCC)    Personal history of radiation therapy    PONV (postoperative nausea and vomiting)    Recurrent UTI    S/P hip replacement    Scoliosis    Sleep apnea    Thyroid nodule    Urinary incontinence    Uterine cancer (HCC)    Stage 1   Venous insufficiency    s/p ablation    Past Surgical History:  Procedure Laterality Date   ABDOMINAL HYSTERECTOMY  09/10/2013   ATRIAL FIBRILLATION ABLATION N/A 08/09/2019   Procedure: ATRIAL FIBRILLATION ABLATION;  Surgeon: Regan Lemming, MD;  Location: [facility];  Service: Cardiovascular;  Laterality: N/A;   BREAST LUMPECTOMY WITH RADIOACTIVE SEED  AND SENTINEL LYMPH NODE BIOPSY Left 02/16/2021   Procedure: LEFT BREAST LUMPECTOMY WITH RADIOACTIVE SEED X2 AND SENTINEL LYMPH NODE BIOPSY;  Surgeon: Harriette Bouillon, MD;  Location: [facility];  Service: General;  Laterality: Left;   BUNIONECTOMY  10/1976   CHOLECYSTECTOMY N/A 03/12/2019   Procedure: XI ROBOTIC ASSISTED CHOLECYSTECTOMY;  Surgeon: Berna Bue, MD;  Location: [facility];  Service: General;  Laterality: N/A;   COLONOSCOPY     COLONOSCOPY WITH PROPOFOL N/A 01/17/2020   Procedure: COLONOSCOPY WITH PROPOFOL;  Surgeon: Lynann Bologna, MD;  Location: [facility];  Service: Endoscopy;  Laterality: N/A;   DRUG INDUCED ENDOSCOPY N/A 03/04/2020   Procedure: DRUG INDUCED ENDOSCOPY;   Surgeon: Christia Reading, MD;  Location: [facility];  Service: ENT;  Laterality: N/A;   FRACTURE SURGERY  09/1964   floor of orbit right side   HIATAL HERNIA REPAIR     IMPLANTATION OF HYPOGLOSSAL NERVE STIMULATOR Right 04/15/2020   Procedure: IMPLANTATION OF HYPOGLOSSAL NERVE STIMULATOR;  Surgeon: Christia Reading, MD;  Location: [facility];  Service: ENT;  Laterality: Right;   JOINT REPLACEMENT     bilateral hip replacements   MELANOMA EXCISION  12/2011   POLYPECTOMY  01/17/2020   Procedure: POLYPECTOMY;  Surgeon: Lynann Bologna, MD;  Location: [facility];  Service: Endoscopy;;   TOTAL HIP ARTHROPLASTY Left 06/17/2014   Procedure: LEFT TOTAL HIP ARTHROPLASTY ANTERIOR APPROACH;  Surgeon: Kathryne Hitch, MD;  Location: [facility];  Service: Orthopedics;  Laterality: Left;   TOTAL HIP ARTHROPLASTY Right 09/02/2014   Procedure: RIGHT TOTAL HIP ARTHROPLASTY ANTERIOR APPROACH;  Surgeon: Kathryne Hitch, MD;  Location: [facility];  Service: Orthopedics;  Laterality: Right;   VEIN SURGERY  05/2011   venous ablation      Current Outpatient Medications on File Prior to Visit  Medication Sig Dispense Refill   acetaminophen (TYLENOL) 500 MG tablet Take 2 tablets (1,000 mg total) by mouth every 6 (six) hours as needed.  0   apixaban (ELIQUIS) 5 MG TABS tablet TAKE 1 TABLET(5 MG) BY MOUTH TWICE DAILY. 180 tablet 1   Cranberry 500 MG TABS Take 1,000 mg by mouth 2 (two) times daily.     Cyanocobalamin (VITAMIN B 12) 500 MCG TABS Take 1 tablet by mouth daily.     diltiazem (TIAZAC) 240 MG 24 hr capsule TAKE 1 CAPSULE BY MOUTH EVERY DAY 90 capsule 3   famotidine (PEPCID) 20 MG tablet Take 20 mg by mouth 2 (two) times daily.     ibuprofen (ADVIL) 200 MG tablet Take 400 mg by mouth every 6 (six) hours as needed (pain).     letrozole (FEMARA) 2.5 MG tablet Take 1 tablet (2.5 mg total) by mouth daily. 90 tablet 3   OVER THE COUNTER MEDICATION Take 20 mg by mouth daily as needed  (arthritis). CBD oil     polyethylene glycol (MIRALAX / GLYCOLAX) 17 g packet Take 17 g by mouth as needed (as directed).     sennosides-docusate sodium (SENOKOT-S) 8.6-50 MG tablet Take 1 tablet by mouth as needed for constipation.     spironolactone (ALDACTONE) 50 MG tablet TAKE 2 TABLETS BY MOUTH EVERY MORNING AND 1 TABLET EVERY EVENING 270 tablet 3   triamcinolone cream (KENALOG) 0.1 % Apply 1 application topically daily as needed (for dry skin).   1   Vibegron (GEMTESA) 75 MG TABS Take 75 mg by mouth daily.     vitamin C (ASCORBIC ACID) 500 MG tablet Take  500 mg by mouth 2 (two) times daily.     No current facility-administered medications on file prior to visit.    Allergies  Allergen Reactions   Hydrolyzed Silk Hives and Other (See Comments)    Silk tape-blisters   Ciprofloxacin Other (See Comments)    Reactions with antiarrythmic Reactions with antiarrythmic   Gabapentin    Gluten Meal Other (See Comments)    reflux   Metoprolol     dizzy     DIAGNOSTIC DATA (LABS, IMAGING, TESTING) - I reviewed patient records, labs, notes, testing and imaging myself where available.  Lab Results  Component Value Date   WBC 4.6 01/03/2023   HGB 14.5 01/03/2023   HCT 43.0 01/03/2023   MCV 104.8 (H) 01/03/2023   PLT 226.0 01/03/2023      Component Value Date/Time   NA 133 (L) 01/03/2023 1107   NA 137 11/04/2022 0843   K 4.8 01/03/2023 1107   CL 98 01/03/2023 1107   CO2 27 01/03/2023 1107   GLUCOSE 92 01/03/2023 1107   BUN 19 01/03/2023 1107   BUN 16 11/04/2022 0843   CREATININE 0.79 01/03/2023 1107   CREATININE 0.88 06/22/2016 0930   CALCIUM 9.7 01/03/2023 1107   PROT 6.9 01/03/2023 1107   ALBUMIN 4.6 01/03/2023 1107   AST 22 01/03/2023 1107   ALT 26 01/03/2023 1107   ALKPHOS 78 01/03/2023 1107   BILITOT 0.7 01/03/2023 1107   GFRNONAA >60 02/10/2021 0900   GFRAA 89 08/07/2019 0825   Lab Results  Component Value Date   CHOL 148 01/03/2023   HDL 39.20 01/03/2023    LDLCALC 87 01/03/2023   TRIG 108.0 01/03/2023   CHOLHDL 4 01/03/2023   Lab Results  Component Value Date   HGBA1C 5.5 11/27/2020   Lab Results  Component Value Date   VITAMINB12 1,247 (H) 01/03/2023   Lab Results  Component Value Date   TSH 2.32 01/03/2023    PHYSICAL EXAM:  Today's Vitals   02/27/23 0856  BP: 139/71  Pulse: 84  Weight: 229 lb (103.9 kg)  Height: 5\' 9"  (1.753 m)   Body mass index is 33.82 kg/m.   Wt Readings from Last 3 Encounters:  02/27/23 229 lb (103.9 kg)  01/26/23 230 lb (104.3 kg)  01/16/23 236 lb (107 kg)     Ht Readings from Last 3 Encounters:  02/27/23 5\' 9"  (1.753 m)  01/26/23 5\' 8"  (1.727 m)  01/16/23 5\' 8"  (1.727 m)      General: The patient is awake, alert and appears not in acute distress. The patient is well groomed. Head: Normocephalic, atraumatic. Neck is supple. Mallampati 2,  neck circumference:15.5 inches . Nasal airflow is patent.  Retrognathia is not seen.  Dental status: biological  Cardiovascular:  Regular rate and cardiac rhythm by pulse,  without distended neck veins. Respiratory: Lungs are clear to auscultation.  Skin:  Without evidence of ankle edema, or rash. Trunk: The patient's posture is erect.   NEUROLOGIC EXAM: The patient is awake and alert, oriented to place and time.   Memory subjective described as intact.  Attention span & concentration ability appears normal.  Speech is fluent,  without  dysarthria, dysphonia or aphasia.  Mood and affect are appropriate.   Cranial nerves: no loss of smell or taste reported    Hearing was intact to soft voice and finger rubbing.    Facial sensation intact to fine touch.  Facial motor strength is symmetric and tongue and uvula move midline.  Neck ROM : rotation, tilt and flexion extension were normal for age and shoulder shrug was symmetrical.     ASSESSMENT AND PLAN 72 y.o. year old female  here with:   HYPOGLOSSAL NERVE STIMULATOR in the treatment of OSA, this  patient was not using oxygen at any time.        1) Having taken a break form inspire while struggling with dental pain., then with bronchitis-   she has sciatica, insomnia- She has a dry mouth. At  her tongue buckled but did not cross the teeth. The wave form was well formed.  She had restarted at 1.6 V and now was increased to 1.7 V  today.  We will meet in 8 weeks again and see how for she has titrated.    2) Insomnia is a chronic problem that is not related to apnea/ could it be related to pain and stimulus by Inspire?    3) No longer in atrial fib. !!! 4) BMI has changed  : 33 !! It will be harder to have effective tongue motion when he rests in supine - even if reclined ( that helps a bit ).        1) Plan : Methcarbamol for the night .po  2) increase over the next 8 weeks to 1.8V    I plan to follow up either personally or through our NP within 8 weeks-.  I would like to thank Dr Venita Lick, DO,  and [name], Shelly Patricia, Md [address] [place],  [place] [address] for allowing me to meet with and to take care of this pleasant patient.    After spending a total time of  21  minutes face to face and additional time for physical and neurologic examination, review of laboratory studies,  personal review of imaging studies, reports and results of other testing and review of referral information / records as far as provided in visit,   Electronically signed by: Melvyn Novas, MD 02/27/2023 9:18 AM  [facility] Board certified by The [employer] of Sleep Medicine and Diplomate of the [employer] of Sleep Medicine. Board certified In Neurology through the ABPN, Fellow of the [employer] of Neurology.

## 2023-02-27 NOTE — Patient Instructions (Signed)
BIOTENE mouth wash for dry mouth,   Xylit lozenges.   Rv in 8 weeks.

## 2023-02-27 NOTE — Telephone Encounter (Signed)
Patient came in for an inspire follow up.

## 2023-03-01 ENCOUNTER — Encounter: Payer: Self-pay | Admitting: Internal Medicine

## 2023-03-01 ENCOUNTER — Ambulatory Visit (INDEPENDENT_AMBULATORY_CARE_PROVIDER_SITE_OTHER): Payer: PPO | Admitting: Internal Medicine

## 2023-03-01 VITALS — BP 110/68 | HR 88 | Temp 98.1°F

## 2023-03-01 DIAGNOSIS — M5442 Lumbago with sciatica, left side: Secondary | ICD-10-CM

## 2023-03-01 MED ORDER — MELOXICAM 7.5 MG PO TABS: ORAL_TABLET | ORAL | 0 refills | Status: DC

## 2023-03-01 MED ORDER — CYCLOBENZAPRINE HCL 5 MG PO TABS: 5.0000 mg | ORAL_TABLET | Freq: Every evening | ORAL | 1 refills | Status: AC | PRN

## 2023-03-01 NOTE — Progress Notes (Signed)
Established Patient Office Visit     CC/Reason for Visit: Low back pain  HPI: [name] is a 72 y.o. female who is coming in today for the above mentioned reasons.  She has left lower back and buttock pain that has been present for about a week.  It is interfering with her sleep.  She also has tingling down the back and side of her thigh and knee.   Past Medical/Surgical History: Past Medical History:  Diagnosis Date   Chronic edema    Clotting disorder (HCC)    Constipation    Difficult intubation    pt states that her neck needs to be in a neutral position,  scoliosisand herniated dics in neck   Family history of colon cancer    Family history of uterine cancer    Gallstones    GERD (gastroesophageal reflux disease)    protonix, hx h. pylori   Herniated disc, cervical    History of hiatal hernia    History of sebaceous adenoma    History of uterine cancer 04/22/2014   sees Dr. 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 Sharyn Lull in dermatology right upper arm   Obesity    OSA on CPAP    uses CPAP   Paroxysmal atrial fibrillation (HCC)    Peripheral vascular disease (HCC)    Personal history of radiation therapy    PONV (postoperative nausea and vomiting)    Recurrent UTI    S/P hip replacement    Scoliosis    Sleep apnea    Thyroid nodule    Urinary incontinence    Uterine cancer (HCC)    Stage 1   Venous insufficiency    s/p ablation    Past Surgical History:  Procedure Laterality Date   ABDOMINAL HYSTERECTOMY  09/10/2013   ATRIAL FIBRILLATION ABLATION N/A 08/09/2019   Procedure: ATRIAL FIBRILLATION ABLATION;  Surgeon: Regan Lemming, MD;  [facility]: [facility];  Service: Cardiovascular;  Laterality: N/A;   BREAST LUMPECTOMY WITH RADIOACTIVE SEED AND SENTINEL LYMPH NODE BIOPSY Left 02/16/2021   Procedure: LEFT BREAST LUMPECTOMY WITH RADIOACTIVE  SEED X2 AND SENTINEL LYMPH NODE BIOPSY;  Surgeon: Harriette Bouillon, MD;  Location: [facility];  Service: General;  Laterality: Left;   BUNIONECTOMY  10/1976   CHOLECYSTECTOMY N/A 03/12/2019   Procedure: XI ROBOTIC ASSISTED CHOLECYSTECTOMY;  Surgeon: Berna Bue, MD;  Location: [facility];  Service: General;  Laterality: N/A;   COLONOSCOPY     COLONOSCOPY WITH PROPOFOL N/A 01/17/2020   Procedure: COLONOSCOPY WITH PROPOFOL;  Surgeon: Lynann Bologna, MD;  Location: [facility];  Service: Endoscopy;  Laterality: N/A;   DRUG INDUCED ENDOSCOPY N/A 03/04/2020   Procedure: DRUG INDUCED ENDOSCOPY;  Surgeon: Christia Reading, MD;  Location: [facility];  Service: ENT;  Laterality: N/A;   FRACTURE SURGERY  09/1964   floor of orbit right side   HIATAL HERNIA REPAIR     IMPLANTATION OF HYPOGLOSSAL NERVE STIMULATOR Right 04/15/2020   Procedure: IMPLANTATION OF HYPOGLOSSAL NERVE STIMULATOR;  Surgeon: Christia Reading, MD;  Location: [facility];  Service: ENT;  Laterality: Right;   JOINT REPLACEMENT     bilateral hip replacements   MELANOMA EXCISION  12/2011   POLYPECTOMY  01/17/2020   Procedure: POLYPECTOMY;  Surgeon: Lynann Bologna, MD;  Location: [facility];  Service: Endoscopy;;  TOTAL HIP ARTHROPLASTY Left 06/17/2014   Procedure: LEFT TOTAL HIP ARTHROPLASTY ANTERIOR APPROACH;  Surgeon: Kathryne Hitch, MD;  Location: [facility];  Service: Orthopedics;  Laterality: Left;   TOTAL HIP ARTHROPLASTY Right 09/02/2014   Procedure: RIGHT TOTAL HIP ARTHROPLASTY ANTERIOR APPROACH;  Surgeon: Kathryne Hitch, MD;  Location: [facility];  Service: Orthopedics;  Laterality: Right;   VEIN SURGERY  05/2011   venous ablation     Social History:  reports that she has never smoked. She has never used smokeless tobacco. She reports that she does not currently use alcohol. She reports that she does not use drugs.  Allergies: Allergies  Allergen Reactions   Hydrolyzed Silk Hives and  Other (See Comments)    Silk tape-blisters   Ciprofloxacin Other (See Comments)    Reactions with antiarrythmic Reactions with antiarrythmic   Gabapentin    Gluten Meal Other (See Comments)    reflux   Metoprolol     dizzy    Family History:  Family History  Problem Relation Age of Onset   Uterine cancer Mother    Emphysema Mother    Hypertension Father 41   Hyperlipidemia Father    Epilepsy Father 68   Colon cancer Father 83   Leukemia Father    Arthritis Sister 73   Diabetes Brother 46   Cancer Paternal Uncle        unk type   Cancer Maternal Grandmother        unk type possibly breast or skin   Other Paternal Grandmother        influenza    Stroke Paternal Grandfather    Skin cancer Daughter        SCC on back   Esophageal cancer Neg Hx    Rectal cancer Neg Hx    Stomach cancer Neg Hx    Sleep apnea Neg Hx      Current Outpatient Medications:    acetaminophen (TYLENOL) 500 MG tablet, Take 2 tablets (1,000 mg total) by mouth every 6 (six) hours as needed., Disp: , Rfl: 0   apixaban (ELIQUIS) 5 MG TABS tablet, TAKE 1 TABLET(5 MG) BY MOUTH TWICE DAILY., Disp: 180 tablet, Rfl: 1   Cranberry 500 MG TABS, Take 1,000 mg by mouth 2 (two) times daily., Disp: , Rfl:    Cyanocobalamin (VITAMIN B 12) 500 MCG TABS, Take 1 tablet by mouth daily., Disp: , Rfl:    diltiazem (TIAZAC) 240 MG 24 hr capsule, TAKE 1 CAPSULE BY MOUTH EVERY DAY, Disp: 90 capsule, Rfl: 3   famotidine (PEPCID) 20 MG tablet, Take 20 mg by mouth 2 (two) times daily., Disp: , Rfl:    ibuprofen (ADVIL) 200 MG tablet, Take 400 mg by mouth every 6 (six) hours as needed (pain)., Disp: , Rfl:    letrozole (FEMARA) 2.5 MG tablet, Take 1 tablet (2.5 mg total) by mouth daily., Disp: 90 tablet, Rfl: 3   OVER THE COUNTER MEDICATION, Take 20 mg by mouth daily as needed (arthritis). CBD oil, Disp: , Rfl:    polyethylene glycol (MIRALAX / GLYCOLAX) 17 g packet, Take 17 g by mouth as needed (as directed)., Disp: , Rfl:     sennosides-docusate sodium (SENOKOT-S) 8.6-50 MG tablet, Take 1 tablet by mouth as needed for constipation., Disp: , Rfl:    spironolactone (ALDACTONE) 50 MG tablet, TAKE 2 TABLETS BY MOUTH EVERY MORNING AND 1 TABLET EVERY EVENING, Disp: 270 tablet, Rfl: 3   triamcinolone cream (KENALOG) 0.1 %, Apply 1 application  topically daily as needed (for dry skin). , Disp: , Rfl: 1   Vibegron (GEMTESA) 75 MG TABS, Take 75 mg by mouth daily., Disp: , Rfl:    vitamin C (ASCORBIC ACID) 500 MG tablet, Take 500 mg by mouth 2 (two) times daily., Disp: , Rfl:   Review of Systems:  Negative unless indicated in HPI.   Physical Exam: Vitals:   03/01/23 0931  BP: 110/68  Pulse: 88  Temp: 98.1 F (36.7 C)  TempSrc: Oral  SpO2: 98%    There is no height or weight on file to calculate BMI.   Physical Exam Vitals reviewed.  Constitutional:      Appearance: Normal appearance.  HENT:     Head: Normocephalic and atraumatic.  Eyes:     Conjunctiva/sclera: Conjunctivae normal.  Musculoskeletal:     Lumbar back: Normal.  Skin:    General: Skin is warm and dry.  Neurological:     General: No focal deficit present.     Mental Status: She is alert and oriented to person, place, and time.  Psychiatric:        Mood and Affect: Mood normal.        Behavior: Behavior normal.        Thought Content: Thought content normal.        Judgment: Judgment normal.      Impression and Plan:  Acute left-sided low back pain with left-sided sciatica  -Suspect this is musculoskeletal pain/sciatica given lack of red flag signs/symptoms. -Advised icing, as needed NSAIDs, back stretches, local massage therapy. -Meloxicam and flexeril prescribed as well as a referral to PT.    Time spent:31 minutes reviewing chart, interviewing and examining patient and formulating plan of care.     Chaya Jan, MD  [facility]

## 2023-03-02 DIAGNOSIS — N3000 Acute cystitis without hematuria: Secondary | ICD-10-CM | POA: Diagnosis not present

## 2023-03-02 DIAGNOSIS — N3281 Overactive bladder: Secondary | ICD-10-CM | POA: Diagnosis not present

## 2023-03-03 ENCOUNTER — Ambulatory Visit: Payer: PPO | Attending: Internal Medicine | Admitting: Physical Therapy

## 2023-03-03 ENCOUNTER — Other Ambulatory Visit: Payer: Self-pay

## 2023-03-03 ENCOUNTER — Encounter: Payer: Self-pay | Admitting: Physical Therapy

## 2023-03-03 DIAGNOSIS — R2689 Other abnormalities of gait and mobility: Secondary | ICD-10-CM | POA: Diagnosis not present

## 2023-03-03 DIAGNOSIS — M5442 Lumbago with sciatica, left side: Secondary | ICD-10-CM | POA: Insufficient documentation

## 2023-03-03 DIAGNOSIS — M6281 Muscle weakness (generalized): Secondary | ICD-10-CM | POA: Insufficient documentation

## 2023-03-03 DIAGNOSIS — M5459 Other low back pain: Secondary | ICD-10-CM | POA: Insufficient documentation

## 2023-03-03 NOTE — Therapy (Signed)
OUTPATIENT PHYSICAL THERAPY THORACOLUMBAR EVALUATION   Patient Name: [name] MRN: [id number] DOB:1950-06-01, 72 y.o., female Today's Date: 03/03/2023  END OF SESSION:  PT End of Session - 03/03/23 0953     Visit Number 1    Number of Visits 12    Date for PT Re-Evaluation 04/21/23    Authorization Type [employer]    Progress Note Due on Visit 10    PT Start Time 0848    PT Stop Time 0935    PT Time Calculation (min) 47 min    Activity Tolerance Patient tolerated treatment well    Behavior During Therapy WFL for tasks assessed/performed             Past Medical History:  Diagnosis Date   Chronic edema    Clotting disorder (HCC)    Constipation    Difficult intubation    pt states that her neck needs to be in a neutral position,  scoliosisand herniated dics in neck   Family history of colon cancer    Family history of uterine cancer    Gallstones    GERD (gastroesophageal reflux disease)    protonix, hx h. pylori   Herniated disc, cervical    History of hiatal hernia    History of sebaceous adenoma    History of uterine cancer 04/22/2014   sees Dr. 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 Sharyn Lull in dermatology right upper arm   Obesity    OSA on CPAP    uses CPAP   Paroxysmal atrial fibrillation (HCC)    Peripheral vascular disease (HCC)    Personal history of radiation therapy    PONV (postoperative nausea and vomiting)    Recurrent UTI    S/P hip replacement    Scoliosis    Sleep apnea    Thyroid nodule    Urinary incontinence    Uterine cancer (HCC)    Stage 1   Venous insufficiency    s/p ablation   Past Surgical History:  Procedure Laterality Date   ABDOMINAL HYSTERECTOMY  09/10/2013   ATRIAL FIBRILLATION ABLATION N/A 08/09/2019   Procedure: ATRIAL FIBRILLATION ABLATION;  Surgeon: Regan Lemming, MD;  [facility];   Service: Cardiovascular;  Laterality: N/A;   BREAST LUMPECTOMY WITH RADIOACTIVE SEED AND SENTINEL LYMPH NODE BIOPSY Left 02/16/2021   Procedure: LEFT BREAST LUMPECTOMY WITH RADIOACTIVE SEED X2 AND SENTINEL LYMPH NODE BIOPSY;  Surgeon: Harriette Bouillon, MD;  Location: [facility];  Service: General;  Laterality: Left;   BUNIONECTOMY  10/1976   CHOLECYSTECTOMY N/A 03/12/2019   Procedure: XI ROBOTIC ASSISTED CHOLECYSTECTOMY;  Surgeon: Berna Bue, MD;  Location: [facility];  Service: General;  Laterality: N/A;   COLONOSCOPY     COLONOSCOPY WITH PROPOFOL N/A 01/17/2020   Procedure: COLONOSCOPY WITH PROPOFOL;  Surgeon: Lynann Bologna, MD;  [facility];  Service: Endoscopy;  Laterality: N/A;   DRUG INDUCED ENDOSCOPY N/A 03/04/2020   Procedure: DRUG INDUCED ENDOSCOPY;  Surgeon: Christia Reading, MD;  Location: [facility];  Service: ENT;  Laterality: N/A;   FRACTURE SURGERY  09/1964   floor of orbit right side   HIATAL HERNIA REPAIR     IMPLANTATION OF HYPOGLOSSAL NERVE STIMULATOR Right 04/15/2020   Procedure: IMPLANTATION OF HYPOGLOSSAL NERVE STIMULATOR;  Surgeon: Christia Reading, MD;  Location: [facility];  Service:  ENT;  Laterality: Right;   JOINT REPLACEMENT     bilateral hip replacements   MELANOMA EXCISION  12/2011   POLYPECTOMY  01/17/2020   Procedure: POLYPECTOMY;  Surgeon: Lynann Bologna, MD;  [facility];  Service: Endoscopy;;   TOTAL HIP ARTHROPLASTY Left 06/17/2014   Procedure: LEFT TOTAL HIP ARTHROPLASTY ANTERIOR APPROACH;  Surgeon: Kathryne Hitch, MD;  Location: [facility];  Service: Orthopedics;  Laterality: Left;   TOTAL HIP ARTHROPLASTY Right 09/02/2014   Procedure: RIGHT TOTAL HIP ARTHROPLASTY ANTERIOR APPROACH;  Surgeon: Kathryne Hitch, MD;  [facility];  Service: Orthopedics;  Laterality: Right;   VEIN SURGERY  05/2011   venous ablation    Patient Active Problem List   Diagnosis Date Noted   S/P placement of  hypoglossal nerve stimulator 01/16/2023   Macrocytosis 12/28/2021   Malignant neoplasm of upper-outer quadrant of left breast in female, estrogen receptor positive (HCC) 01/07/2021   Vitamin D deficiency 12/01/2020   Intolerance of continuous positive airway pressure (CPAP) ventilation 05/13/2020   Severe obstructive sleep apnea-hypopnea syndrome 05/13/2020   Hx of colonic polyps    Adenomatous polyp of ascending colon    Genetic testing 06/24/2019   Family history of uterine cancer    Family history of colon cancer    History of uterine cancer    History of sebaceous adenoma    S/P repair of paraesophageal hernia 03/12/2019   Syncope 02/27/2018   Community acquired pneumonia 02/10/2018   CAP (community acquired pneumonia) 02/10/2018   Long term current use of antiarrhythmic drug 11/02/2015   Varicose veins 11/02/2015   OAB (overactive bladder) 09/07/2015   History of melanoma 06/10/2015   Thyroid nodule 06/10/2015   GERD (gastroesophageal reflux disease) 05/25/2015   Chronic venous insufficiency 05/25/2015   Hx of essential hypertension 05/25/2015   Atrial fibrillation (HCC) 04/22/2014   Osteoarthritis, hip, bilateral 03/25/2014   Lumbar and sacral osteoarthritis 12/10/2013    REFERRING PROVIDER: Philip Aspen, Limmie Patricia, MD  REFERRING DIAG: Acute left-sided low back pain with left-sided sciatica [M54.42]   Rationale for Evaluation and Treatment: Rehabilitation  THERAPY DIAG:  Muscle weakness (generalized)  Other abnormalities of gait and mobility  Other low back pain  ONSET DATE: Ongoing with most recently having pain increase about a month ago.   SUBJECTIVE:                                                                                                                                                                                           SUBJECTIVE STATEMENT: Pt states that she has "significant" scoliosis and believes that she no longer has any room  for her  nerves to go through her spine anymore. She has symptoms going from her back down her leg and into her L knee. Tingling and consistent pain when present. She notes most pain with walking and standing for prolonged periods. She likes to walk her dog, but states that they are both slow. Pt finds relief from piriformis stretches on the L side only with pain noted when attempted on the R. She also notes relief with LTR's. Heat helps, meds have not been beneficial at this time. She wears compression stockings everyday due to hx of PVD.   PERTINENT HISTORY:  Chronic edema, clotting disorder, PVD, Bilat Hip replacements, Scoliosis.  PAIN:  Are you having pain? Yes: NPRS scale: 1/2 with sitting, 7-8 standing/ walking.  Pain location: Left leg  Pain description: Tingling Aggravating factors: Wlaking standing for prolonged periods of time.  Relieving factors: Sitting, rest, heat   PRECAUTIONS: None  RED FLAGS: None   WEIGHT BEARING RESTRICTIONS: No  FALLS:  Has patient fallen in last 6 months? No  LIVING ENVIRONMENT: Lives with: lives alone Lives in: House/apartment Stairs: No Has following equipment at home: None  OCCUPATION: Retired  PLOF: Independent  PATIENT GOALS: Pt would like to get back to walking and cooking without pain.   NEXT MD VISIT: None scheduled.   OBJECTIVE:  Note: Objective measures were completed at Evaluation unless otherwise noted.  DIAGNOSTIC FINDINGS:  None recent.   PATIENT SURVEYS:  Oswestry: 26/50 52%   COGNITION: Overall cognitive status: Within functional limits for tasks assessed     SENSATION: WFL   POSTURE: rounded shoulders, forward head, and scoliosis.    LUMBAR ROM:   AROM eval  Flexion WFL with hip hinge.   Extension WFL   Right lateral flexion/ Left lateral flexion:  Pt has increased movement on R> L possibly due to limitations with scoliosis. No pain noted, but reports of fear with L side movement.   Right rotation WFL  Left  rotation WFL   (Blank rows = not tested)  LOWER EXTREMITY ROM:     Active  Right eval Left eval  Hip flexion [facility]  Hip extension    Hip abduction    Hip internal rotation    Hip external rotation Mild limitation in seated position Moderate limitation in seated position  Knee flexion 115 111 supine  Knee extension Supine -18 Supine -17   (Blank rows = not tested)  LOWER EXTREMITY MMT:    MMT Right eval Left eval  Hip flexion 4 4-  Knee flexion 4 4  Knee extension 4+ 4+   (Blank rows = not tested)  FUNCTIONAL TESTS:  5 times sit to stand: 14.51 sec 3 minute walk test: 459 ft with forward flexed trunk and slow antalgic gait. Pt has minimal trunk rotation.   GAIT: Distance walked: 553ft Assistive device utilized: None Level of assistance: Complete Independence Comments: forward flexed trunk and slow antalgic gait. Pt has minimal trunk rotation.  TREATMENT DATE: 03/03/2023:  PATIENT EDUCATION:  Education details: Educated pt on anatomy and physiology of current symptoms, FOTO, diagnosis, prognosis, HEP,  and POC. Person educated: Patient Education method: Medical illustrator Education comprehension: verbalized understanding and returned demonstration  HOME EXERCISE PROGRAM: Access Code: [email or web address] URL: [email or web address] Date: 03/03/2023 Prepared by: Royal Hawthorn  Exercises - Supine Hamstring Stretch with Strap  - 2 x daily - 7 x weekly - 2 sets - 2 reps - 30 hold - Seated Hamstring Stretch  - 2 x daily - 7 x weekly - 2 sets - 2 reps - 30 hold - Supine Bridge  - 1 x daily - 7 x weekly - 3 sets - 10 reps - Supine Lower Trunk Rotation  - 1 x daily - 7 x weekly - 3 sets - 10 reps  ASSESSMENT:  CLINICAL IMPRESSION: Patient referred to PT for acute on chronic lower back pain. Pt has a very complex hx of lower back  pain, along with scoliosis and bilat hip replacements in 2016. She presents with a flexed trunk in standing and demonstrates a similar gait. Pt has a lot of tension in her hamstrings and gastrocs. She also demonstrates weakness in her hip flexors. She reports burning in her thighs with prolonged standing. Pt ambulates with minimal trunk rotation and a stooped posture. Discussed working on elongating tight muscles, strengthening weak ones and working on gait and balance due to significant deficits. Patient will benefit from skilled PT to address below impairments, limitations and improve overall function.  OBJECTIVE IMPAIRMENTS: decreased activity tolerance, difficulty walking, decreased balance, decreased endurance, decreased mobility, decreased ROM, decreased strength, impaired flexibility, impaired UE/LE use, postural dysfunction, and pain.  ACTIVITY LIMITATIONS: bending, lifting, carry, locomotion, cleaning, community activity, driving, and or occupation  PERSONAL FACTORS: Chronic edema, clotting disorder, PVD, Bilat Hip replacements, Scoliosis. are also affecting patient's functional outcome.  REHAB POTENTIAL: Good  CLINICAL DECISION MAKING: Stable/uncomplicated  EVALUATION COMPLEXITY: Low    GOALS: Short term PT Goals Target date: 03/17/2023 Pt will be I and compliant with HEP. Baseline:  Goal status: New Pt will decrease pain by 25% overall Baseline: Goal status: New  Long term PT goals Target date: 04/21/2023 Pt will improve ROM to [facility] to improve functional mobility Baseline: Goal status: New Pt will improve  hip/knee strength to at least 5-/5 MMT to improve functional strength Baseline: Goal status: New Pt will improve OSWESTRY to at least 70% functional to show improved function Baseline: Goal status: New Pt will reduce pain by overall 50% overall with usual activity Baseline: Goal status: New Pt will be able to stand for >10 minutes without reports of burning in her  thighs. Baseline: Goal status: New Pt will be able to ambulate community distances at least 1000 ft WNL gait pattern without complaints Baseline: Goal status: New  PLAN: PT FREQUENCY: 1-2 times per week   PT DURATION: 4-6 weeks  PLANNED INTERVENTIONS (unless contraindicated): aquatic PT, Canalith repositioning, cryotherapy, Electrical stimulation, Iontophoresis with 4 mg/ml dexamethasome, Moist heat, traction, Ultrasound, gait training, Therapeutic exercise, balance training, neuromuscular re-education, patient/family education, prosthetic training, manual techniques, passive ROM, dry needling, taping, vasopnuematic device, vestibular, spinal manipulations, joint manipulations  PLAN FOR NEXT SESSION: Strengthen and stretch bilat LE's. Work on core strengthening, balance and gait.     Champ Mungo, PT 03/03/2023, 9:55 AM

## 2023-03-17 ENCOUNTER — Ambulatory Visit: Payer: PPO | Attending: Internal Medicine | Admitting: Physical Therapy

## 2023-03-17 DIAGNOSIS — R2689 Other abnormalities of gait and mobility: Secondary | ICD-10-CM | POA: Diagnosis not present

## 2023-03-17 DIAGNOSIS — M5459 Other low back pain: Secondary | ICD-10-CM | POA: Insufficient documentation

## 2023-03-17 DIAGNOSIS — M6281 Muscle weakness (generalized): Secondary | ICD-10-CM | POA: Insufficient documentation

## 2023-03-17 NOTE — Therapy (Signed)
 OUTPATIENT PHYSICAL THERAPY THORACOLUMBAR PROGRESS NOTE   Patient Name: [name] MRN: [id number] DOB:1950-08-12, 73 y.o., female Today's Date: 03/17/2023  END OF SESSION:  PT End of Session - 03/17/23 0845     Visit Number 2    Number of Visits 12    Date for PT Re-Evaluation 04/21/23    Authorization Type [employer]    Progress Note Due on Visit 10    PT Start Time 0845    PT Stop Time 0925    PT Time Calculation (min) 40 min    Activity Tolerance Patient tolerated treatment well             Past Medical History:  Diagnosis Date   Chronic edema    Clotting disorder (HCC)    Constipation    Difficult intubation    pt states that her neck needs to be in a neutral position,  scoliosisand herniated dics in neck   Family history of colon cancer    Family history of uterine cancer    Gallstones    GERD (gastroesophageal reflux disease)    protonix , hx h. pylori   Herniated disc, cervical    History of hiatal hernia    History of sebaceous adenoma    History of uterine cancer 04/22/2014   sees Dr. 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 Tricia in dermatology right upper arm   Obesity    OSA on CPAP    uses CPAP   Paroxysmal atrial fibrillation (HCC)    Peripheral vascular disease (HCC)    Personal history of radiation therapy    PONV (postoperative nausea and vomiting)    Recurrent UTI    S/P hip replacement    Scoliosis    Sleep apnea    Thyroid  nodule    Urinary incontinence    Uterine cancer (HCC)    Stage 1   Venous insufficiency    s/p ablation   Past Surgical History:  Procedure Laterality Date   ABDOMINAL HYSTERECTOMY  09/10/2013   ATRIAL FIBRILLATION ABLATION N/A 08/09/2019   Procedure: ATRIAL FIBRILLATION ABLATION;  Surgeon: Inocencio Soyla Lunger, MD;  Location: [facility];  Service: Cardiovascular;  Laterality: N/A;   BREAST  LUMPECTOMY WITH RADIOACTIVE SEED AND SENTINEL LYMPH NODE BIOPSY Left 02/16/2021   Procedure: LEFT BREAST LUMPECTOMY WITH RADIOACTIVE SEED X2 AND SENTINEL LYMPH NODE BIOPSY;  Surgeon: Vanderbilt Ned, MD;  Location: [facility];  Service: General;  Laterality: Left;   BUNIONECTOMY  10/1976   CHOLECYSTECTOMY N/A 03/12/2019   Procedure: XI ROBOTIC ASSISTED CHOLECYSTECTOMY;  Surgeon: Signe Mitzie LABOR, MD;  Location: [facility];  Service: General;  Laterality: N/A;   COLONOSCOPY     COLONOSCOPY WITH PROPOFOL  N/A 01/17/2020   Procedure: COLONOSCOPY WITH PROPOFOL ;  Surgeon: Charlanne Groom, MD;  Location: [facility];  Service: Endoscopy;  Laterality: N/A;   DRUG INDUCED ENDOSCOPY N/A 03/04/2020   Procedure: DRUG INDUCED ENDOSCOPY;  Surgeon: Carlie Clark, MD;  Location: [facility];  Service: ENT;  Laterality: N/A;   FRACTURE SURGERY  09/1964   floor of orbit right side   HIATAL HERNIA REPAIR     IMPLANTATION OF HYPOGLOSSAL NERVE STIMULATOR Right 04/15/2020   Procedure: IMPLANTATION OF HYPOGLOSSAL NERVE STIMULATOR;  Surgeon: Carlie Clark, MD;  Location: [facility];  Service: ENT;  Laterality: Right;   JOINT REPLACEMENT  bilateral hip replacements   MELANOMA EXCISION  12/2011   POLYPECTOMY  01/17/2020   Procedure: POLYPECTOMY;  Surgeon: Charlanne Groom, MD;  Location: [facility];  Service: Endoscopy;;   TOTAL HIP ARTHROPLASTY Left 06/17/2014   Procedure: LEFT TOTAL HIP ARTHROPLASTY ANTERIOR APPROACH;  Surgeon: Lonni CINDERELLA Poli, MD;  Location: [facility];  Service: Orthopedics;  Laterality: Left;   TOTAL HIP ARTHROPLASTY Right 09/02/2014   Procedure: RIGHT TOTAL HIP ARTHROPLASTY ANTERIOR APPROACH;  Surgeon: Lonni CINDERELLA Poli, MD;  Location: [facility];  Service: Orthopedics;  Laterality: Right;   VEIN SURGERY  05/2011   venous ablation    Patient Active Problem List   Diagnosis Date Noted   S/P placement of hypoglossal nerve stimulator 01/16/2023   Macrocytosis  12/28/2021   Malignant neoplasm of upper-outer quadrant of left breast in female, estrogen receptor positive (HCC) 01/07/2021   Vitamin D  deficiency 12/01/2020   Intolerance of continuous positive airway pressure (CPAP) ventilation 05/13/2020   Severe obstructive sleep apnea-hypopnea syndrome 05/13/2020   Hx of colonic polyps    Adenomatous polyp of ascending colon    Genetic testing 06/24/2019   Family history of uterine cancer    Family history of colon cancer    History of uterine cancer    History of sebaceous adenoma    S/P repair of paraesophageal hernia 03/12/2019   Syncope 02/27/2018   Community acquired pneumonia 02/10/2018   CAP (community acquired pneumonia) 02/10/2018   Long term current use of antiarrhythmic drug 11/02/2015   Varicose veins 11/02/2015   OAB (overactive bladder) 09/07/2015   History of melanoma 06/10/2015   Thyroid  nodule 06/10/2015   GERD (gastroesophageal reflux disease) 05/25/2015   Chronic venous insufficiency 05/25/2015   Hx of essential hypertension 05/25/2015   Atrial fibrillation (HCC) 04/22/2014   Osteoarthritis, hip, bilateral 03/25/2014   Lumbar and sacral osteoarthritis 12/10/2013    REFERRING PROVIDER: Theophilus Andrews, Tully CINDERELLA, MD  REFERRING DIAG: Acute left-sided low back pain with left-sided sciatica [M54.42]   Rationale for Evaluation and Treatment: Rehabilitation  THERAPY DIAG:  Muscle weakness (generalized)  Other abnormalities of gait and mobility  Other low back pain  ONSET DATE: Ongoing with most recently having pain increase about a month ago.   SUBJECTIVE:                                                                                                                                                                                           SUBJECTIVE STATEMENT: I am doing remarkably better.  I've been better with standing and that muscle in my left thigh is loosening up.  Doing most of the ex's except  for the bridge  it sets off leg cramps. My couch could be the culprit b/c of sit to stand.  Less burning on front thighs but also pain travels or tingle down outer left calf.      EVAL: Pt states that she has significant scoliosis and believes that she no longer has any room for her nerves to go through her spine anymore. She has symptoms going from her back down her leg and into her L knee. Tingling and consistent pain when present. She notes most pain with walking and standing for prolonged periods. She likes to walk her dog, but states that they are both slow. Pt finds relief from piriformis stretches on the L side only with pain noted when attempted on the R. She also notes relief with LTR's. Heat helps, meds have not been beneficial at this time. She wears compression stockings everyday due to hx of PVD.   PERTINENT HISTORY:  Chronic edema, clotting disorder, PVD, Bilat Hip replacements, Scoliosis. Rotator cuff tear  PAIN:  Are you having pain? Yes: NPRS scale: 0/10 right now Pain location: Left leg  Pain description: Tingling Aggravating factors: Wlaking standing for prolonged periods of time.  Relieving factors: Sitting, rest, heat   PRECAUTIONS: None  RED FLAGS: None   WEIGHT BEARING RESTRICTIONS: No  FALLS:  Has patient fallen in last 6 months? No  LIVING ENVIRONMENT: Lives with: lives alone Lives in: House/apartment Stairs: No Has following equipment at home: None  OCCUPATION: Retired  PLOF: Independent  PATIENT GOALS: Pt would like to get back to walking and cooking without pain.   NEXT MD VISIT: None scheduled.   OBJECTIVE:  Note: Objective measures were completed at Evaluation unless otherwise noted.  DIAGNOSTIC FINDINGS:  None recent.   PATIENT SURVEYS:  Oswestry: 26/50 52%   COGNITION: Overall cognitive status: Within functional limits for tasks assessed     SENSATION: WFL   POSTURE: rounded shoulders, forward head, and scoliosis.    LUMBAR ROM:   AROM  eval  Flexion WFL with hip hinge.   Extension WFL   Right lateral flexion/ Left lateral flexion:  Pt has increased movement on R> L possibly due to limitations with scoliosis. No pain noted, but reports of fear with L side movement.   Right rotation WFL  Left rotation WFL   (Blank rows = not tested)  LOWER EXTREMITY ROM:     Active  Right eval Left eval  Hip flexion [facility]  Hip extension    Hip abduction    Hip internal rotation    Hip external rotation Mild limitation in seated position Moderate limitation in seated position  Knee flexion 115 111 supine  Knee extension Supine -18 Supine -17   (Blank rows = not tested)  LOWER EXTREMITY MMT:    MMT Right eval Left eval  Hip flexion 4 4-  Knee flexion 4 4  Knee extension 4+ 4+   (Blank rows = not tested)  FUNCTIONAL TESTS:  5 times sit to stand: 14.51 sec 3 minute walk test: 459 ft with forward flexed trunk and slow antalgic gait. Pt has minimal trunk rotation.   GAIT: Distance walked: 553ft Assistive device utilized: None Level of assistance: Complete Independence Comments: forward flexed trunk and slow antalgic gait. Pt has minimal trunk rotation.  TREATMENT DATE:  03/17/23:     Anti-inflammatory strategies 2nd step stretch series: hip flexor stretch, and added UE reach up and over Nu-Step L1 (blue machine) 6 min Seated abdominal activation  with green ball in lap 5 sec hold 10x Seated 4# plyo ball chops 10x each way Rise 2x from mat table no hands Standing: bil shoulder extension green band 10x to activate lower abdominals Standing: single arm shoulder extension green band with opposite hip flexion 5x right/left (difficulty balancing) Standing: isometric shoulder extension green band hold with marching 2 sets of 5                                                                                                                              PATIENT EDUCATION:  Education details: Educated pt on anatomy and  physiology of current symptoms, FOTO, diagnosis, prognosis, HEP,  and POC. Person educated: Patient Education method: Medical Illustrator Education comprehension: verbalized understanding and returned demonstration  HOME EXERCISE PROGRAM: Access Code: [email or web address] URL: [email or web address] Date: 03/03/2023 Prepared by: Rojean Batten  Exercises - Supine Hamstring Stretch with Strap  - 2 x daily - 7 x weekly - 2 sets - 2 reps - 30 hold - Seated Hamstring Stretch  - 2 x daily - 7 x weekly - 2 sets - 2 reps - 30 hold - Supine Bridge  - 1 x daily - 7 x weekly - 3 sets - 10 reps - Supine Lower Trunk Rotation  - 1 x daily - 7 x weekly - 3 sets - 10 reps  ASSESSMENT:  CLINICAL IMPRESSION:  Pt reports improving symptoms including severity and presence of symptoms.  She reports her home ex's going well except for bridging secondary to muscle cramping.  She was able to initiate core strengthening in seated and standing positions with a good initial response. Therapist providing verbal cues to optimize technique with  exercises in order to achieve the greatest benefit.  At the end of session she reports some burning in thighs but not debilitating as she has previously experienced.     OBJECTIVE IMPAIRMENTS: decreased activity tolerance, difficulty walking, decreased balance, decreased endurance, decreased mobility, decreased ROM, decreased strength, impaired flexibility, impaired UE/LE use, postural dysfunction, and pain.  ACTIVITY LIMITATIONS: bending, lifting, carry, locomotion, cleaning, community activity, driving, and or occupation  PERSONAL FACTORS: Chronic edema, clotting disorder, PVD, Bilat Hip replacements, Scoliosis. are also affecting patient's functional outcome.  REHAB POTENTIAL: Good  CLINICAL DECISION MAKING: Stable/uncomplicated  EVALUATION COMPLEXITY: Low    GOALS: Short term PT Goals Target date: 03/17/2023 Pt will be I and compliant with  HEP. Baseline:  Goal status: New Pt will decrease pain by 25% overall Baseline: Goal status: New  Long term PT goals Target date: 04/21/2023 Pt will improve ROM to [facility] to improve functional mobility Baseline: Goal status: New Pt will improve  hip/knee strength to at least 5-/5 MMT to improve functional strength Baseline: Goal status: New Pt will improve OSWESTRY to at least 70% functional to show improved function Baseline: Goal status: New Pt will reduce pain by overall 50% overall with usual activity Baseline: Goal status: New Pt will be able  to stand for >10 minutes without reports of burning in her thighs. Baseline: Goal status: New Pt will be able to ambulate community distances at least 1000 ft WNL gait pattern without complaints Baseline: Goal status: New  PLAN: PT FREQUENCY: 1-2 times per week   PT DURATION: 4-6 weeks  PLANNED INTERVENTIONS (unless contraindicated): aquatic PT, Canalith repositioning, cryotherapy, Electrical stimulation, Iontophoresis with 4 mg/ml dexamethasome, Moist heat, traction, Ultrasound, gait training, Therapeutic exercise, balance training, neuromuscular re-education, patient/family education, prosthetic training, manual techniques, passive ROM, dry needling, taping, vasopnuematic device, vestibular, spinal manipulations, joint manipulations  PLAN FOR NEXT SESSION: add to HEP if seated and standing core ex's were well tolerated; Strengthen and stretch bilat LE's. Work on core strengthening, balance and gait.   Glade Pesa, PT 03/17/23 12:44 PM Phone: [phone] Fax: [phone]

## 2023-03-20 ENCOUNTER — Inpatient Hospital Stay: Payer: PPO | Attending: Hematology and Oncology | Admitting: Hematology and Oncology

## 2023-03-20 VITALS — BP 137/84 | HR 78 | Temp 97.9°F | Resp 18 | Ht 69.0 in | Wt 231.1 lb

## 2023-03-20 DIAGNOSIS — C50412 Malignant neoplasm of upper-outer quadrant of left female breast: Secondary | ICD-10-CM | POA: Insufficient documentation

## 2023-03-20 DIAGNOSIS — Z170 Estrogen receptor positive status [ER+]: Secondary | ICD-10-CM | POA: Diagnosis not present

## 2023-03-20 DIAGNOSIS — Z79811 Long term (current) use of aromatase inhibitors: Secondary | ICD-10-CM | POA: Diagnosis not present

## 2023-03-20 MED ORDER — LETROZOLE 2.5 MG PO TABS: 2.5000 mg | ORAL_TABLET | Freq: Every day | ORAL | 3 refills | Status: DC

## 2023-03-20 NOTE — Progress Notes (Signed)
 Patient Care Team: Theophilus Andrews, Tully GRADE, MD as PCP - General (Internal Medicine) Inocencio Soyla Lunger, MD as PCP - Electrophysiology (Cardiology) Mona Vinie BROCKS, MD as Consulting Physician (Cardiology) Gorge Ade, MD as Consulting Physician (Obstetrics and Gynecology) Tricia, Tawni CROME, MD as Referring Physician (Dermatology) Cam Morene ORN, MD as Attending Physician (Urology) Odean Potts, MD as Consulting Physician (Hematology and Oncology) Izell Domino, MD as Attending Physician (Radiation Oncology) Vanderbilt Ned, MD as Consulting Physician (General Surgery)  DIAGNOSIS:  Encounter Diagnosis  Name Primary?   Malignant neoplasm of upper-outer quadrant of left breast in female, estrogen receptor positive (HCC) Yes    SUMMARY OF ONCOLOGIC HISTORY: Oncology History  Malignant neoplasm of upper-outer quadrant of left breast in female, estrogen receptor positive (HCC)  06/02/2019 Genetic Testing   Negative. Genes Tested include: APC*, ATM*, AXIN2, BAP1, BARD1, BMPR1A, BRCA1, BRCA2, BRIP1, CDH1, CDK4, CDKN2A (p14ARF), CDKN2A (p16INK4a), CHEK2, CTNNA1, DICER1*, EPCAM*, GREM1*, HOXB13, KIT, MEN1*, MITF*, MLH1*, MSH2*, MSH3*, MSH6*, MUTYH, NBN, NF1*, NTHL1, PALB2, PDGFRA, PMS2*, POLD1*, POLE, POT1, PTEN*, RAD50, RAD51C, RAD51D, RB1*, RNF43, SDHA*, SDHB, SDHC*, SDHD, SMAD4, SMARCA4, STK11, TP53, TSC1*, TSC2, VHL.   12/29/2020 Initial Diagnosis   Screening mammogram: 2 possible areas of distortion in the left breast 1o clock 11 O Clock. Diagnostic mammogram and US : large suspicious area of distortion within the superior left breast.  Both biopsies: Grade 2 IDC ER 95%, PR 95%, Ki-67 5%, HER2 equivocal by IHC, FISH negative ratio 1.08 and copy #1.95   02/16/2021 Surgery   02/16/2021: Left lumpectomy: IDC with DCIS 4.6 cm, grade 2, margins negative, 1/2 lymph nodes positive, ER 95%, PR 100%, HER2 equivocal by IHC FISH ratio 1.18: Negative, Ki-67 2%   02/16/2021  Miscellaneous   Mammaprint: low risk indicating no chemotherapy benefit   04/12/2021 - 05/21/2021 Radiation Therapy   Site Technique Total Dose (Gy) Dose per Fx (Gy) Completed Fx Beam Energies  Breast, Left: Breast_L 3D 50/50 2 25/25 10X  Breast, Left: Breast_L_SCV_PAB 3D 50/50 2 25/25 10X  Breast, Left: Breast_L_Bst 3D 10/10 2 5/5 6X, 10X     06/2021 -  Anti-estrogen oral therapy   Letrozole  daily     CHIEF COMPLIANT: Follow-up on letrozole   HISTORY OF PRESENT ILLNESS:   History of Present Illness   The patient, a breast cancer survivor, is two years post-diagnosis and has been on Letrozole . She reports occasional hot flashes, which were initially annoying but are now tolerable. She has adjusted to taking the medication at night. She has a history of uterine cancer and underwent a hysterectomy. She also has bladder control issues and has switched from Myrbetriq  to Gemtesa. She has been experiencing back issues and left leg pain, which was initially thought to be knee-related but was later identified as sciatica. Physical therapy has significantly improved these symptoms. She also takes Meloxicam  for pain management.         ALLERGIES:  is allergic to hydrolyzed silk, ciprofloxacin, gabapentin , gluten meal, and metoprolol .  MEDICATIONS:  Current Outpatient Medications  Medication Sig Dispense Refill   acetaminophen  (TYLENOL ) 500 MG tablet Take 2 tablets (1,000 mg total) by mouth every 6 (six) hours as needed.  0   apixaban  (ELIQUIS ) 5 MG TABS tablet TAKE 1 TABLET(5 MG) BY MOUTH TWICE DAILY. 180 tablet 1   Cranberry 500 MG TABS Take 1,000 mg by mouth 2 (two) times daily.     Cyanocobalamin  (VITAMIN B 12) 500 MCG TABS Take 1 tablet by mouth daily.  cyclobenzaprine  (FLEXERIL ) 5 MG tablet Take 1 tablet (5 mg total) by mouth at bedtime as needed for muscle spasms. 30 tablet 1   diltiazem  (TIAZAC ) 240 MG 24 hr capsule TAKE 1 CAPSULE BY MOUTH EVERY DAY 90 capsule 3   famotidine (PEPCID)  20 MG tablet Take 20 mg by mouth 2 (two) times daily.     ibuprofen  (ADVIL ) 200 MG tablet Take 400 mg by mouth every 6 (six) hours as needed (pain).     letrozole  (FEMARA ) 2.5 MG tablet Take 1 tablet (2.5 mg total) by mouth daily. 90 tablet 3   meloxicam  (MOBIC ) 7.5 MG tablet Take 1 tablet daily for 10 days. Additional tablets only if needed. 30 tablet 0   OVER THE COUNTER MEDICATION Take 20 mg by mouth daily as needed (arthritis). CBD oil     polyethylene glycol (MIRALAX  / GLYCOLAX ) 17 g packet Take 17 g by mouth as needed (as directed).     sennosides-docusate sodium  (SENOKOT-S) 8.6-50 MG tablet Take 1 tablet by mouth as needed for constipation.     spironolactone  (ALDACTONE ) 50 MG tablet TAKE 2 TABLETS BY MOUTH EVERY MORNING AND 1 TABLET EVERY EVENING 270 tablet 3   triamcinolone cream (KENALOG) 0.1 % Apply 1 application topically daily as needed (for dry skin).   1   Vibegron (GEMTESA) 75 MG TABS Take 75 mg by mouth daily.     vitamin C (ASCORBIC ACID) 500 MG tablet Take 500 mg by mouth 2 (two) times daily.     No current facility-administered medications for this visit.    PHYSICAL EXAMINATION: ECOG PERFORMANCE STATUS: 1 - Symptomatic but completely ambulatory  Vitals:   03/20/23 1023  BP: 137/84  Pulse: 78  Resp: 18  Temp: 97.9 F (36.6 C)  SpO2: 100%   Filed Weights   03/20/23 1023  Weight: 231 lb 1.6 oz (104.8 kg)    Physical Exam   BREAST: Scar tissue present, consistent with healing. No palpable masses.      (exam performed in the presence of a chaperone)  LABORATORY DATA:  I have reviewed the data as listed    Latest Ref Rng & Units 01/03/2023   11:07 AM 11/04/2022    8:43 AM 12/27/2021    7:46 AM  CMP  Glucose 70 - 99 mg/dL 92  897  89   BUN 6 - 23 mg/dL 19  16  20    Creatinine 0.40 - 1.20 mg/dL 9.20  9.15  9.18   Sodium 135 - 145 mEq/L 133  137  135   Potassium 3.5 - 5.1 mEq/L 4.8  5.0  4.8   Chloride 96 - 112 mEq/L 98  99  101   CO2 19 - 32 mEq/L 27  24   27    Calcium  8.4 - 10.5 mg/dL 9.7  9.6  9.8   Total Protein 6.0 - 8.3 g/dL 6.9   7.1   Total Bilirubin 0.2 - 1.2 mg/dL 0.7   0.5   Alkaline Phos 39 - 117 U/L 78   72   AST 0 - 37 U/L 22   24   ALT 0 - 35 U/L 26   20     Lab Results  Component Value Date   WBC 4.6 01/03/2023   HGB 14.5 01/03/2023   HCT 43.0 01/03/2023   MCV 104.8 (H) 01/03/2023   PLT 226.0 01/03/2023   NEUTROABS 2.4 01/03/2023    ASSESSMENT & PLAN:  Malignant neoplasm of upper-outer quadrant of left breast  in female, estrogen receptor positive (HCC) 02/16/2021: Left lumpectomy: IDC with DCIS 4.6 cm, grade 2, margins negative, 1/2 lymph nodes positive, ER 95%, PR 100%, HER2 equivocal by IHC FISH ratio 1.18: Negative, Ki-67 2% MammaPrint: Low risk Adjuvant radiation 04/13/2021 05/21/2021    Treatment plan: Adjuvant antiestrogen therapy with letrozole  2.5 mg daily x 7 years   Letrozole  toxicities: Hot flashes: Marked improvement since when she started the medication. Patient lost about 20 pounds by eating healthy.  This has significantly improved her quality of life as well as the hot flashes.   Breast cancer surveillance: Breast exam 03/20/2023: Benign Mammogram 12/28/2022: Benign breast density category B   Return to clinic in 1 year for follow-up    No orders of the defined types were placed in this encounter.  The patient has a good understanding of the overall plan. she agrees with it. she will call with any problems that may develop before the next visit here. Total time spent: 30 mins including face to face time and time spent for planning, charting and co-ordination of care   Naomi MARLA Chad, MD 03/20/23

## 2023-03-20 NOTE — Assessment & Plan Note (Signed)
 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 Patient lost about 20 pounds by eating healthy.  This has significantly improved her quality of life as well as the hot flashes.   Breast cancer surveillance: Breast exam 03/20/2023: Benign Mammogram 12/28/2022: Benign breast density category B   Return to clinic in 1 year for follow-up

## 2023-03-22 ENCOUNTER — Ambulatory Visit: Payer: PPO | Admitting: Physical Therapy

## 2023-03-22 DIAGNOSIS — R2689 Other abnormalities of gait and mobility: Secondary | ICD-10-CM

## 2023-03-22 DIAGNOSIS — M6281 Muscle weakness (generalized): Secondary | ICD-10-CM | POA: Diagnosis not present

## 2023-03-22 DIAGNOSIS — M5459 Other low back pain: Secondary | ICD-10-CM

## 2023-03-22 NOTE — Therapy (Signed)
 OUTPATIENT PHYSICAL THERAPY THORACOLUMBAR PROGRESS NOTE   Patient Name: [name] MRN: [id number] DOB:1951-03-11, 73 y.o., female Today's Date: 03/22/2023  END OF SESSION:  PT End of Session - 03/22/23 1013     Visit Number 3    Date for PT Re-Evaluation 04/21/23    Authorization Type [employer]    Progress Note Due on Visit 10    PT Start Time 1015    PT Stop Time 1055    PT Time Calculation (min) 40 min    Activity Tolerance Patient tolerated treatment well             Past Medical History:  Diagnosis Date   Chronic edema    Clotting disorder (HCC)    Constipation    Difficult intubation    pt states that her neck needs to be in a neutral position,  scoliosisand herniated dics in neck   Family history of colon cancer    Family history of uterine cancer    Gallstones    GERD (gastroesophageal reflux disease)    protonix , hx h. pylori   Herniated disc, cervical    History of hiatal hernia    History of sebaceous adenoma    History of uterine cancer 04/22/2014   sees Dr. 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 Tricia in dermatology right upper arm   Obesity    OSA on CPAP    uses CPAP   Paroxysmal atrial fibrillation (HCC)    Peripheral vascular disease (HCC)    Personal history of radiation therapy    PONV (postoperative nausea and vomiting)    Recurrent UTI    S/P hip replacement    Scoliosis    Sleep apnea    Thyroid  nodule    Urinary incontinence    Uterine cancer (HCC)    Stage 1   Venous insufficiency    s/p ablation   Past Surgical History:  Procedure Laterality Date   ABDOMINAL HYSTERECTOMY  09/10/2013   ATRIAL FIBRILLATION ABLATION N/A 08/09/2019   Procedure: ATRIAL FIBRILLATION ABLATION;  Surgeon: Inocencio Soyla Lunger, MD;  Location: [facility];  Service: Cardiovascular;  Laterality: N/A;   BREAST LUMPECTOMY WITH RADIOACTIVE  SEED AND SENTINEL LYMPH NODE BIOPSY Left 02/16/2021   Procedure: LEFT BREAST LUMPECTOMY WITH RADIOACTIVE SEED X2 AND SENTINEL LYMPH NODE BIOPSY;  Surgeon: Vanderbilt Ned, MD;  Location: [facility];  Service: General;  Laterality: Left;   BUNIONECTOMY  10/1976   CHOLECYSTECTOMY N/A 03/12/2019   Procedure: XI ROBOTIC ASSISTED CHOLECYSTECTOMY;  Surgeon: Signe Mitzie LABOR, MD;  Location: [facility];  Service: General;  Laterality: N/A;   COLONOSCOPY     COLONOSCOPY WITH PROPOFOL  N/A 01/17/2020   Procedure: COLONOSCOPY WITH PROPOFOL ;  Surgeon: Charlanne Groom, MD;  Location: [facility];  Service: Endoscopy;  Laterality: N/A;   DRUG INDUCED ENDOSCOPY N/A 03/04/2020   Procedure: DRUG INDUCED ENDOSCOPY;  Surgeon: Carlie Clark, MD;  Location: [facility];  Service: ENT;  Laterality: N/A;   FRACTURE SURGERY  09/1964   floor of orbit right side   HIATAL HERNIA REPAIR     IMPLANTATION OF HYPOGLOSSAL NERVE STIMULATOR Right 04/15/2020   Procedure: IMPLANTATION OF HYPOGLOSSAL NERVE STIMULATOR;  Surgeon: Carlie Clark, MD;  Location: [facility];  Service: ENT;  Laterality: Right;   JOINT REPLACEMENT     bilateral hip replacements  MELANOMA EXCISION  12/2011   POLYPECTOMY  01/17/2020   Procedure: POLYPECTOMY;  Surgeon: Charlanne Groom, MD;  Location: [facility];  Service: Endoscopy;;   TOTAL HIP ARTHROPLASTY Left 06/17/2014   Procedure: LEFT TOTAL HIP ARTHROPLASTY ANTERIOR APPROACH;  Surgeon: Lonni CINDERELLA Poli, MD;  [facility];  Service: Orthopedics;  Laterality: Left;   TOTAL HIP ARTHROPLASTY Right 09/02/2014   Procedure: RIGHT TOTAL HIP ARTHROPLASTY ANTERIOR APPROACH;  Surgeon: Lonni CINDERELLA Poli, MD;  Location: [facility];  Service: Orthopedics;  Laterality: Right;   VEIN SURGERY  05/2011   venous ablation    Patient Active Problem List   Diagnosis Date Noted   S/P placement of hypoglossal nerve stimulator 01/16/2023   Macrocytosis 12/28/2021   Malignant  neoplasm of upper-outer quadrant of left breast in female, estrogen receptor positive (HCC) 01/07/2021   Vitamin D  deficiency 12/01/2020   Intolerance of continuous positive airway pressure (CPAP) ventilation 05/13/2020   Severe obstructive sleep apnea-hypopnea syndrome 05/13/2020   Hx of colonic polyps    Adenomatous polyp of ascending colon    Genetic testing 06/24/2019   Family history of uterine cancer    Family history of colon cancer    History of uterine cancer    History of sebaceous adenoma    S/P repair of paraesophageal hernia 03/12/2019   Syncope 02/27/2018   Community acquired pneumonia 02/10/2018   CAP (community acquired pneumonia) 02/10/2018   Long term current use of antiarrhythmic drug 11/02/2015   Varicose veins 11/02/2015   OAB (overactive bladder) 09/07/2015   History of melanoma 06/10/2015   Thyroid  nodule 06/10/2015   GERD (gastroesophageal reflux disease) 05/25/2015   Chronic venous insufficiency 05/25/2015   Hx of essential hypertension 05/25/2015   Atrial fibrillation (HCC) 04/22/2014   Osteoarthritis, hip, bilateral 03/25/2014   Lumbar and sacral osteoarthritis 12/10/2013    REFERRING PROVIDER: Theophilus Andrews, Tully CINDERELLA, MD  REFERRING DIAG: Acute left-sided low back pain with left-sided sciatica [M54.42]   Rationale for Evaluation and Treatment: Rehabilitation  THERAPY DIAG:  Muscle weakness (generalized)  Other abnormalities of gait and mobility  Other low back pain  ONSET DATE: Ongoing with most recently having pain increase about a month ago.   SUBJECTIVE:                                                                                                                                                                                           SUBJECTIVE STATEMENT: Had a good birthday.  I had it in mind to put this off b/c my stomach was woozy but it's gone now.  My hips are feeling better.    Has  a gym at apartment complex but I don't use it  ([employer], step)   EVAL: Pt states that she has significant scoliosis and believes that she no longer has any room for her nerves to go through her spine anymore. She has symptoms going from her back down her leg and into her L knee. Tingling and consistent pain when present. She notes most pain with walking and standing for prolonged periods. She likes to walk her dog, but states that they are both slow. Pt finds relief from piriformis stretches on the L side only with pain noted when attempted on the R. She also notes relief with LTR's. Heat helps, meds have not been beneficial at this time. She wears compression stockings everyday due to hx of PVD.   PERTINENT HISTORY:  Chronic edema, clotting disorder, PVD, Bilat Hip replacements, Scoliosis. Rotator cuff tear  PAIN:  Are you having pain? Yes: NPRS scale: 0/10  Pain location: Left leg  Pain description: Tingling Aggravating factors: Wlaking standing for prolonged periods of time.  Relieving factors: Sitting, rest, heat   PRECAUTIONS: None  RED FLAGS: None   WEIGHT BEARING RESTRICTIONS: No  FALLS:  Has patient fallen in last 6 months? No  LIVING ENVIRONMENT: Lives with: lives alone Lives in: House/apartment Stairs: No Has following equipment at home: None  OCCUPATION: Retired  PLOF: Independent  PATIENT GOALS: Pt would like to get back to walking and cooking without pain.   NEXT MD VISIT: None scheduled.   OBJECTIVE:  Note: Objective measures were completed at Evaluation unless otherwise noted.  DIAGNOSTIC FINDINGS:  None recent.   PATIENT SURVEYS:  Oswestry: 26/50 52%   COGNITION: Overall cognitive status: Within functional limits for tasks assessed     SENSATION: WFL   POSTURE: rounded shoulders, forward head, and scoliosis.    LUMBAR ROM:   AROM eval  Flexion WFL with hip hinge.   Extension WFL   Right lateral flexion/ Left lateral flexion:  Pt has increased movement on R> L possibly due to  limitations with scoliosis. No pain noted, but reports of fear with L side movement.   Right rotation WFL  Left rotation WFL   (Blank rows = not tested)  LOWER EXTREMITY ROM:     Active  Right eval Left eval  Hip flexion [facility]  Hip extension    Hip abduction    Hip internal rotation    Hip external rotation Mild limitation in seated position Moderate limitation in seated position  Knee flexion 115 111 supine  Knee extension Supine -18 Supine -17   (Blank rows = not tested)  LOWER EXTREMITY MMT:    MMT Right eval Left eval  Hip flexion 4 4-  Knee flexion 4 4  Knee extension 4+ 4+   (Blank rows = not tested)  FUNCTIONAL TESTS:  5 times sit to stand: 14.51 sec 3 minute walk test: 459 ft with forward flexed trunk and slow antalgic gait. Pt has minimal trunk rotation.   GAIT: Distance walked: 589ft Assistive device utilized: None Level of assistance: Complete Independence Comments: forward flexed trunk and slow antalgic gait. Pt has minimal trunk rotation.  TREATMENT DATE:  03/22/23:     Nu-Step L5 (green machine) 7 min while discussing status Seated 4# plyo ball chops 10x each way (added to HEP) Seated 4# plyo ball hip to hip 10x  Sit to stand holding 4# plyo ball 2 sets of 5 (added to HEP) 2nd step stretch series: hip flexor stretch, and added  UE reach up and over 6 inch step ups: 2 sets of 5 right/left Standing: bil shoulder rows green band 10x (added to HEP) Standing: bil shoulder extension green band 10x to activate lower abdominals (added to HEP) Standing dead lifts pair of 4# 10x (increase weight next time) Standing: farmer's carry with marching (attempted [facility]OS[facility] taps but loss of balance requiring assist to recover) Standing: single arm hold at shoulder with marching Farmer's carry bil 4# around the gym 1 lap  03/17/23:     Anti-inflammatory strategies 2nd step stretch series: hip flexor stretch, and added UE reach up and over Nu-Step L1 (blue machine) 6  min Seated abdominal activation with green ball in lap 5 sec hold 10x Seated 4# plyo ball chops 10x each way Rise 2x from mat table no hands Standing: bil shoulder extension green band 10x to activate lower abdominals Standing: single arm shoulder extension green band with opposite hip flexion 5x right/left (difficulty balancing) Standing: isometric shoulder extension green band hold with marching 2 sets of 5                                                                                                                              PATIENT EDUCATION:  Education details: Educated pt on anatomy and physiology of current symptoms, FOTO, diagnosis, prognosis, HEP,  and POC. Person educated: Patient Education method: Medical Illustrator Education comprehension: verbalized understanding and returned demonstration  HOME EXERCISE PROGRAM: Access Code: [email or web address] URL: [email or web address] Date: 03/22/2023 Prepared by: Glade Pesa  Exercises - Supine Hamstring Stretch with Strap  - 2 x daily - 7 x weekly - 2 sets - 2 reps - 30 hold - Seated Hamstring Stretch  - 2 x daily - 7 x weekly - 2 sets - 2 reps - 30 hold - Supine Bridge  - 1 x daily - 7 x weekly - 3 sets - 10 reps - Supine Lower Trunk Rotation  - 1 x daily - 7 x weekly - 3 sets - 10 reps - Seated Diagonal Chop with Medicine Ball  - 1 x daily - 7 x weekly - 1 sets - 10 reps - Sit to Stand  - 1 x daily - 7 x weekly - 2 sets - 5 reps - Standing Bilateral Low Shoulder Row with Anchored Resistance  - 1 x daily - 7 x weekly - 1 sets - 10 reps - Shoulder Extension with Resistance Hands Down  - 1 x daily - 7 x weekly - 1 sets - 10 reps  ASSESSMENT:  CLINICAL IMPRESSION:  Therapist progressing and updating HEP for increased intensity and challenge level for further strengthening and functional mobility.   Therapist providing verbal cues to optimize technique with  exercises in order to achieve the greatest benefit.   Patient is challenged with balancing on single leg and would benefit from further exercise targeting this for home and community safety.  No symptoms reported during treatment session.  OBJECTIVE IMPAIRMENTS: decreased activity tolerance, difficulty walking, decreased balance, decreased endurance, decreased mobility, decreased ROM, decreased strength, impaired flexibility, impaired UE/LE use, postural dysfunction, and pain.  ACTIVITY LIMITATIONS: bending, lifting, carry, locomotion, cleaning, community activity, driving, and or occupation  PERSONAL FACTORS: Chronic edema, clotting disorder, PVD, Bilat Hip replacements, Scoliosis. are also affecting patient's functional outcome.  REHAB POTENTIAL: Good  CLINICAL DECISION MAKING: Stable/uncomplicated  EVALUATION COMPLEXITY: Low    GOALS: Short term PT Goals Target date: 03/17/2023 Pt will be I and compliant with HEP. Baseline:  Goal status: met 1/8 Pt will decrease pain by 25% overall Baseline: Goal status: met 1/8  Long term PT goals Target date: 04/21/2023 Pt will improve ROM to [facility] to improve functional mobility Baseline: Goal status: New Pt will improve  hip/knee strength to at least 5-/5 MMT to improve functional strength Baseline: Goal status: New Pt will improve OSWESTRY to at least 70% functional to show improved function Baseline: Goal status: New Pt will reduce pain by overall 50% overall with usual activity Baseline: Goal status: New Pt will be able to stand for >10 minutes without reports of burning in her thighs. Baseline: Goal status: New Pt will be able to ambulate community distances at least 1000 ft WNL gait pattern without complaints Baseline: Goal status: New  PLAN: PT FREQUENCY: 1-2 times per week   PT DURATION: 4-6 weeks  PLANNED INTERVENTIONS (unless contraindicated): aquatic PT, Canalith repositioning, cryotherapy, Electrical stimulation, Iontophoresis with 4 mg/ml dexamethasome, Moist heat,  traction, Ultrasound, gait training, Therapeutic exercise, balance training, neuromuscular re-education, patient/family education, prosthetic training, manual techniques, passive ROM, dry needling, taping, vasopnuematic device, vestibular, spinal manipulations, joint manipulations  PLAN FOR NEXT SESSION:  Strengthen and stretch bilat LE's. Work on core strengthening, balance and gait. Single leg balance  Glade Pesa, PT 03/22/23 1:53 PM Phone: [phone] Fax: [phone]

## 2023-03-23 ENCOUNTER — Other Ambulatory Visit: Payer: Self-pay | Admitting: Cardiology

## 2023-03-23 DIAGNOSIS — I4891 Unspecified atrial fibrillation: Secondary | ICD-10-CM

## 2023-03-23 NOTE — Telephone Encounter (Signed)
 Prescription refill request for Eliquis received. Indication: Afib  Last office visit: 11/04/22 Lanna Poche)  Scr: 0.79 (01/03/23)  Age: 73 Weight: 104.8kg  Appropriate dose. Refill sent.

## 2023-03-24 ENCOUNTER — Ambulatory Visit: Payer: PPO | Admitting: Physical Therapy

## 2023-03-24 DIAGNOSIS — M6281 Muscle weakness (generalized): Secondary | ICD-10-CM | POA: Diagnosis not present

## 2023-03-24 DIAGNOSIS — R2689 Other abnormalities of gait and mobility: Secondary | ICD-10-CM

## 2023-03-24 DIAGNOSIS — M5459 Other low back pain: Secondary | ICD-10-CM

## 2023-03-24 NOTE — Therapy (Signed)
 OUTPATIENT PHYSICAL THERAPY THORACOLUMBAR PROGRESS NOTE   Patient Name: [name] MRN: [id number] DOB:08-11-1950, 73 y.o., female Today's Date: 03/24/2023  END OF SESSION:  PT End of Session - 03/24/23 0839     Visit Number 4    Date for PT Re-Evaluation 04/21/23    Progress Note Due on Visit 10    PT Start Time 0840    PT Stop Time 0920    PT Time Calculation (min) 40 min    Activity Tolerance Patient tolerated treatment well             Past Medical History:  Diagnosis Date   Chronic edema    Clotting disorder (HCC)    Constipation    Difficult intubation    pt states that her neck needs to be in a neutral position,  scoliosisand herniated dics in neck   Family history of colon cancer    Family history of uterine cancer    Gallstones    GERD (gastroesophageal reflux disease)    protonix , hx h. pylori   Herniated disc, cervical    History of hiatal hernia    History of sebaceous adenoma    History of uterine cancer 04/22/2014   sees Dr. 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 Tricia in dermatology right upper arm   Obesity    OSA on CPAP    uses CPAP   Paroxysmal atrial fibrillation (HCC)    Peripheral vascular disease (HCC)    Personal history of radiation therapy    PONV (postoperative nausea and vomiting)    Recurrent UTI    S/P hip replacement    Scoliosis    Sleep apnea    Thyroid  nodule    Urinary incontinence    Uterine cancer (HCC)    Stage 1   Venous insufficiency    s/p ablation   Past Surgical History:  Procedure Laterality Date   ABDOMINAL HYSTERECTOMY  09/10/2013   ATRIAL FIBRILLATION ABLATION N/A 08/09/2019   Procedure: ATRIAL FIBRILLATION ABLATION;  Surgeon: Inocencio Soyla Lunger, MD;  Location: [facility];  Service: Cardiovascular;  Laterality: N/A;   BREAST LUMPECTOMY WITH RADIOACTIVE SEED AND SENTINEL LYMPH NODE BIOPSY Left  02/16/2021   Procedure: LEFT BREAST LUMPECTOMY WITH RADIOACTIVE SEED X2 AND SENTINEL LYMPH NODE BIOPSY;  Surgeon: Vanderbilt Ned, MD;  Location: [facility];  Service: General;  Laterality: Left;   BUNIONECTOMY  10/1976   CHOLECYSTECTOMY N/A 03/12/2019   Procedure: XI ROBOTIC ASSISTED CHOLECYSTECTOMY;  Surgeon: Signe Mitzie LABOR, MD;  Location: [facility];  Service: General;  Laterality: N/A;   COLONOSCOPY     COLONOSCOPY WITH PROPOFOL  N/A 01/17/2020   Procedure: COLONOSCOPY WITH PROPOFOL ;  Surgeon: Charlanne Groom, MD;  Location: [facility];  Service: Endoscopy;  Laterality: N/A;   DRUG INDUCED ENDOSCOPY N/A 03/04/2020   Procedure: DRUG INDUCED ENDOSCOPY;  Surgeon: Carlie Clark, MD;  Location: [facility];  Service: ENT;  Laterality: N/A;   FRACTURE SURGERY  09/1964   floor of orbit right side   HIATAL HERNIA REPAIR     IMPLANTATION OF HYPOGLOSSAL NERVE STIMULATOR Right 04/15/2020   Procedure: IMPLANTATION OF HYPOGLOSSAL NERVE STIMULATOR;  Surgeon: Carlie Clark, MD;  Location: [facility];  Service: ENT;  Laterality: Right;   JOINT REPLACEMENT     bilateral hip replacements   MELANOMA EXCISION  12/2011  POLYPECTOMY  01/17/2020   Procedure: POLYPECTOMY;  Surgeon: Charlanne Groom, MD;  Location: [facility];  Service: Endoscopy;;   TOTAL HIP ARTHROPLASTY Left 06/17/2014   Procedure: LEFT TOTAL HIP ARTHROPLASTY ANTERIOR APPROACH;  Surgeon: Lonni CINDERELLA Poli, MD;  Location: [facility];  Service: Orthopedics;  Laterality: Left;   TOTAL HIP ARTHROPLASTY Right 09/02/2014   Procedure: RIGHT TOTAL HIP ARTHROPLASTY ANTERIOR APPROACH;  Surgeon: Lonni CINDERELLA Poli, MD;  Location: [facility];  Service: Orthopedics;  Laterality: Right;   VEIN SURGERY  05/2011   venous ablation    Patient Active Problem List   Diagnosis Date Noted   S/P placement of hypoglossal nerve stimulator 01/16/2023   Macrocytosis 12/28/2021   Malignant neoplasm of upper-outer quadrant of left  breast in female, estrogen receptor positive (HCC) 01/07/2021   Vitamin D  deficiency 12/01/2020   Intolerance of continuous positive airway pressure (CPAP) ventilation 05/13/2020   Severe obstructive sleep apnea-hypopnea syndrome 05/13/2020   Hx of colonic polyps    Adenomatous polyp of ascending colon    Genetic testing 06/24/2019   Family history of uterine cancer    Family history of colon cancer    History of uterine cancer    History of sebaceous adenoma    S/P repair of paraesophageal hernia 03/12/2019   Syncope 02/27/2018   Community acquired pneumonia 02/10/2018   CAP (community acquired pneumonia) 02/10/2018   Long term current use of antiarrhythmic drug 11/02/2015   Varicose veins 11/02/2015   OAB (overactive bladder) 09/07/2015   History of melanoma 06/10/2015   Thyroid  nodule 06/10/2015   GERD (gastroesophageal reflux disease) 05/25/2015   Chronic venous insufficiency 05/25/2015   Hx of essential hypertension 05/25/2015   Atrial fibrillation (HCC) 04/22/2014   Osteoarthritis, hip, bilateral 03/25/2014   Lumbar and sacral osteoarthritis 12/10/2013    REFERRING PROVIDER: Theophilus Andrews, Tully CINDERELLA, MD  REFERRING DIAG: Acute left-sided low back pain with left-sided sciatica [M54.42]   Rationale for Evaluation and Treatment: Rehabilitation  THERAPY DIAG:  Muscle weakness (generalized)  Other abnormalities of gait and mobility  Other low back pain  ONSET DATE: Ongoing with most recently having pain increase about a month ago.   SUBJECTIVE:                                                                                                                                                                                           SUBJECTIVE STATEMENT: I went to my grandson's basketball game and walking on the bleachers was hard for balance.   My back is pretty good today. I bought 2 gallons of water from [employer] to use for exercise.  It's cheaper  than weights.    Has a gym  at apartment complex but I don't use it ([employer], step)   EVAL: Pt states that she has significant scoliosis and believes that she no longer has any room for her nerves to go through her spine anymore. She has symptoms going from her back down her leg and into her L knee. Tingling and consistent pain when present. She notes most pain with walking and standing for prolonged periods. She likes to walk her dog, but states that they are both slow. Pt finds relief from piriformis stretches on the L side only with pain noted when attempted on the R. She also notes relief with LTR's. Heat helps, meds have not been beneficial at this time. She wears compression stockings everyday due to hx of PVD.   PERTINENT HISTORY:  Chronic edema, clotting disorder, PVD, Bilat Hip replacements, Scoliosis. Rotator cuff tear  PAIN:  Are you having pain? Yes: NPRS scale: 0/10  Pain location: Left leg  Pain description: Tingling Aggravating factors: Wlaking standing for prolonged periods of time.  Relieving factors: Sitting, rest, heat   PRECAUTIONS: None  RED FLAGS: None   WEIGHT BEARING RESTRICTIONS: No  FALLS:  Has patient fallen in last 6 months? 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   NEXT MD VISIT: None scheduled.   OBJECTIVE:  Note: Objective measures were completed at Evaluation unless otherwise noted.  DIAGNOSTIC FINDINGS:  None recent.   PATIENT SURVEYS:  Oswestry: 26/50 52%   COGNITION: Overall cognitive status: Within functional limits for tasks assessed     SENSATION: WFL   POSTURE: rounded shoulders, forward head, and scoliosis.    LUMBAR ROM:   AROM eval  Flexion WFL with hip hinge.   Extension WFL   Right lateral flexion/ Left lateral  flexion:  Pt has increased movement on R> L possibly due to limitations with scoliosis. No pain noted, but reports of fear with L side movement.   Right rotation WFL  Left rotation WFL   (Blank rows = not tested)  LOWER EXTREMITY ROM:     Active  Right eval Left eval  Hip flexion [facility]  Hip extension    Hip abduction    Hip internal rotation    Hip external rotation Mild limitation in seated position Moderate limitation in seated position  Knee flexion 115 111 supine  Knee extension Supine -18 Supine -17   (Blank rows = not tested)  LOWER EXTREMITY MMT:    MMT Right eval Left eval  Hip flexion 4 4-  Knee flexion 4 4  Knee extension 4+ 4+   (Blank rows = not tested)  FUNCTIONAL TESTS:  5 times sit to stand: 14.51 sec 3 minute walk test: 459 ft with forward flexed trunk and slow antalgic gait. Pt has minimal trunk rotation.   GAIT: Distance walked: 575ft Assistive device utilized: None Level of assistance: Complete Independence Comments: forward flexed trunk and slow antalgic gait. Pt has minimal trunk rotation.  TREATMENT DATE:  03/24/23:     Nu-Step L5 (blue machine) 10 min while discussing status Seated 8# chops, hip to hip 10x Sit to stand holding 8#  5x Staggered stance at the railing 8# dead lifts Farmers hold 8# with same side hip flexion  Farmer's carry  walk 8#: 25 feet 6 laps Modified Dead lifts to knee level pair of 8# 10x Lat bar 25# 2 sets of 10x Leg press seat 8 70# 2 sets of 10x RPE 5/10  03/22/23:     Nu-Step L5 (green machine) 7 min while discussing status Seated 4# plyo ball chops 10x each way (added to HEP) Seated 4# plyo ball hip to hip 10x  Sit to stand holding 4# plyo ball 2 sets of 5 (added to HEP) 2nd step stretch series: hip flexor stretch, and added UE reach up and over 6 inch step ups: 2 sets of 5 right/left Standing: bil shoulder rows green band 10x (added to HEP) Standing: bil shoulder extension green band 10x to activate lower  abdominals (added to HEP) Standing dead lifts pair of 4# 10x (increase weight next time) Standing: farmer's carry with marching (attempted [facility]OS[facility] taps but loss of balance requiring assist to recover) Standing: single arm hold at shoulder with marching Farmer's carry bil 4# around the gym 1 lap  03/17/23:     Anti-inflammatory strategies 2nd step stretch series: hip flexor stretch, and added UE reach up and over Nu-Step L1 (blue machine) 6 min Seated abdominal activation with green ball in lap 5 sec hold 10x Seated 4# plyo ball chops 10x each way Rise 2x from mat table no hands Standing: bil shoulder extension green band 10x to activate lower abdominals Standing: single arm shoulder extension green band with opposite hip flexion 5x right/left (difficulty balancing) Standing: isometric shoulder extension green band hold with marching 2 sets of 5                                                                                                                              PATIENT EDUCATION:  Education details: Educated pt on anatomy and physiology of current symptoms, FOTO, diagnosis, prognosis, HEP,  and POC. Person educated: Patient Education method: Medical Illustrator Education comprehension: verbalized understanding and returned demonstration  HOME EXERCISE PROGRAM: Access Code: [email or web address] URL: [email or web address] Date: 03/22/2023 Prepared by: Glade Pesa  Exercises - Supine Hamstring Stretch with Strap  - 2 x daily - 7 x weekly - 2 sets - 2 reps - 30 hold - Seated Hamstring Stretch  - 2 x daily - 7 x weekly - 2 sets - 2 reps - 30 hold - Supine Bridge  - 1 x daily - 7 x weekly - 3 sets - 10 reps - Supine Lower Trunk Rotation  - 1 x daily - 7 x weekly - 3 sets - 10 reps - Seated Diagonal Chop with Medicine Ball  - 1 x daily - 7 x weekly - 1 sets - 10 reps - Sit to Stand  - 1 x daily - 7 x weekly - 2 sets - 5 reps - Standing Bilateral Low Shoulder Row with  Anchored Resistance  - 1 x daily - 7 x weekly - 1 sets - 10 reps - Shoulder Extension  with Resistance Hands Down  - 1 x daily - 7 x weekly - 1 sets - 10 reps  ASSESSMENT:  CLINICAL IMPRESSION:  Roselee purchased gallon jugs of water from the store this morning.  She was able to use 8# in the session today with several ex's with good response and appropriate challenge level.  Verbal cues for forward gaze and technique to optimize strengthening benefit.  She needs single arm touch support with several ex's for balance. We discussed the benefit of these ex's to address her concerns about her balance.  She would benefit from continued PT to finalize HEP and to help with transition to a gym or other community based ex option.    OBJECTIVE IMPAIRMENTS: decreased activity tolerance, difficulty walking, decreased balance, decreased endurance, decreased mobility, decreased ROM, decreased strength, impaired flexibility, impaired UE/LE use, postural dysfunction, and pain.  ACTIVITY LIMITATIONS: bending, lifting, carry, locomotion, cleaning, community activity, driving, and or occupation  PERSONAL FACTORS: Chronic edema, clotting disorder, PVD, Bilat Hip replacements, Scoliosis. are also affecting patient's functional outcome.  REHAB POTENTIAL: Good  CLINICAL DECISION MAKING: Stable/uncomplicated  EVALUATION COMPLEXITY: Low    GOALS: Short term PT Goals Target date: 03/17/2023 Pt will be I and compliant with HEP. Baseline:  Goal status: met 1/8 Pt will decrease pain by 25% overall Baseline: Goal status: met 1/8  Long term PT goals Target date: 04/21/2023 Pt will improve ROM to [facility] to improve functional mobility Baseline: Goal status: New Pt will improve  hip/knee strength to at least 5-/5 MMT to improve functional strength Baseline: Goal status: New Pt will improve OSWESTRY to at least 70% functional to show improved function Baseline: Goal status: New Pt will reduce pain by overall 50%  overall with usual activity Baseline: Goal status: New Pt will be able to stand for >10 minutes without reports of burning in her thighs. Baseline: Goal status: New Pt will be able to ambulate community distances at least 1000 ft WNL gait pattern without complaints Baseline: Goal status: New  PLAN: PT FREQUENCY: 1-2 times per week   PT DURATION: 4-6 weeks  PLANNED INTERVENTIONS (unless contraindicated): aquatic PT, Canalith repositioning, cryotherapy, Electrical stimulation, Iontophoresis with 4 mg/ml dexamethasome, Moist heat, traction, Ultrasound, gait training, Therapeutic exercise, balance training, neuromuscular re-education, patient/family education, prosthetic training, manual techniques, passive ROM, dry needling, taping, vasopnuematic device, vestibular, spinal manipulations, joint manipulations  PLAN FOR NEXT SESSION:  pt has only 1 more visit scheduled, patient will consider continuing PT through January and will use complex gym or go to [employer]; Biomedical Scientist and stretch bilat LE's. Work on core strengthening, balance and gait. Single leg balance  Glade Pesa, PT 03/24/23 11:30 AM Phone: [phone] Fax: [phone]

## 2023-03-28 ENCOUNTER — Encounter: Payer: PPO | Admitting: Physical Therapy

## 2023-03-28 ENCOUNTER — Other Ambulatory Visit: Payer: Self-pay | Admitting: Internal Medicine

## 2023-03-28 DIAGNOSIS — M5442 Lumbago with sciatica, left side: Secondary | ICD-10-CM

## 2023-03-30 ENCOUNTER — Ambulatory Visit: Payer: PPO | Admitting: Physical Therapy

## 2023-03-30 DIAGNOSIS — M6281 Muscle weakness (generalized): Secondary | ICD-10-CM | POA: Diagnosis not present

## 2023-03-30 DIAGNOSIS — M5459 Other low back pain: Secondary | ICD-10-CM

## 2023-03-30 DIAGNOSIS — R2689 Other abnormalities of gait and mobility: Secondary | ICD-10-CM

## 2023-03-30 NOTE — Therapy (Signed)
OUTPATIENT PHYSICAL THERAPY THORACOLUMBAR PROGRESS NOTE   Patient Name: [name] MRN: [id number] DOB:1950-05-10, 73 y.o., female Today's Date: 03/30/2023  END OF SESSION:  PT End of Session - 03/30/23 0749     Visit Number 5    Date for PT Re-Evaluation 04/21/23    Authorization Type [employer]    Progress Note Due on Visit 10    PT Start Time 0750    PT Stop Time 0830    PT Time Calculation (min) 40 min    Activity Tolerance Patient tolerated treatment well             Past Medical History:  Diagnosis Date   Chronic edema    Clotting disorder (HCC)    Constipation    Difficult intubation    pt states that her neck needs to be in a neutral position,  scoliosisand herniated dics in neck   Family history of colon cancer    Family history of uterine cancer    Gallstones    GERD (gastroesophageal reflux disease)    protonix, hx h. pylori   Herniated disc, cervical    History of hiatal hernia    History of sebaceous adenoma    History of uterine cancer 04/22/2014   sees Dr. 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 Sharyn Lull in dermatology right upper arm   Obesity    OSA on CPAP    uses CPAP   Paroxysmal atrial fibrillation (HCC)    Peripheral vascular disease (HCC)    Personal history of radiation therapy    PONV (postoperative nausea and vomiting)    Recurrent UTI    S/P hip replacement    Scoliosis    Sleep apnea    Thyroid nodule    Urinary incontinence    Uterine cancer (HCC)    Stage 1   Venous insufficiency    s/p ablation   Past Surgical History:  Procedure Laterality Date   ABDOMINAL HYSTERECTOMY  09/10/2013   ATRIAL FIBRILLATION ABLATION N/A 08/09/2019   Procedure: ATRIAL FIBRILLATION ABLATION;  Surgeon: Regan Lemming, MD;  Location: [facility];  Service: Cardiovascular;  Laterality: N/A;   BREAST LUMPECTOMY WITH RADIOACTIVE  SEED AND SENTINEL LYMPH NODE BIOPSY Left 02/16/2021   Procedure: LEFT BREAST LUMPECTOMY WITH RADIOACTIVE SEED X2 AND SENTINEL LYMPH NODE BIOPSY;  Surgeon: Harriette Bouillon, MD;  Location: [facility];  Service: General;  Laterality: Left;   BUNIONECTOMY  10/1976   CHOLECYSTECTOMY N/A 03/12/2019   Procedure: XI ROBOTIC ASSISTED CHOLECYSTECTOMY;  Surgeon: Berna Bue, MD;  Location: [facility];  Service: General;  Laterality: N/A;   COLONOSCOPY     COLONOSCOPY WITH PROPOFOL N/A 01/17/2020   Procedure: COLONOSCOPY WITH PROPOFOL;  Surgeon: Lynann Bologna, MD;  Location: [facility];  Service: Endoscopy;  Laterality: N/A;   DRUG INDUCED ENDOSCOPY N/A 03/04/2020   Procedure: DRUG INDUCED ENDOSCOPY;  Surgeon: Christia Reading, MD;  Location: [facility];  Service: ENT;  Laterality: N/A;   FRACTURE SURGERY  09/1964   floor of orbit right side   HIATAL HERNIA REPAIR     IMPLANTATION OF HYPOGLOSSAL NERVE STIMULATOR Right 04/15/2020   Procedure: IMPLANTATION OF HYPOGLOSSAL NERVE STIMULATOR;  Surgeon: Christia Reading, MD;  Location: [facility];  Service: ENT;  Laterality: Right;   JOINT REPLACEMENT     bilateral hip replacements  MELANOMA EXCISION  12/2011   POLYPECTOMY  01/17/2020   Procedure: POLYPECTOMY;  Surgeon: Lynann Bologna, MD;  Location: [facility];  Service: Endoscopy;;   TOTAL HIP ARTHROPLASTY Left 06/17/2014   Procedure: LEFT TOTAL HIP ARTHROPLASTY ANTERIOR APPROACH;  Surgeon: Kathryne Hitch, MD;  Location: [facility];  Service: Orthopedics;  Laterality: Left;   TOTAL HIP ARTHROPLASTY Right 09/02/2014   Procedure: RIGHT TOTAL HIP ARTHROPLASTY ANTERIOR APPROACH;  Surgeon: Kathryne Hitch, MD;  Location: [facility];  Service: Orthopedics;  Laterality: Right;   VEIN SURGERY  05/2011   venous ablation    Patient Active Problem List   Diagnosis Date Noted   S/P placement of hypoglossal nerve stimulator 01/16/2023   Macrocytosis 12/28/2021   Malignant  neoplasm of upper-outer quadrant of left breast in female, estrogen receptor positive (HCC) 01/07/2021   Vitamin D deficiency 12/01/2020   Intolerance of continuous positive airway pressure (CPAP) ventilation 05/13/2020   Severe obstructive sleep apnea-hypopnea syndrome 05/13/2020   Hx of colonic polyps    Adenomatous polyp of ascending colon    Genetic testing 06/24/2019   Family history of uterine cancer    Family history of colon cancer    History of uterine cancer    History of sebaceous adenoma    S/P repair of paraesophageal hernia 03/12/2019   Syncope 02/27/2018   Community acquired pneumonia 02/10/2018   CAP (community acquired pneumonia) 02/10/2018   Long term current use of antiarrhythmic drug 11/02/2015   Varicose veins 11/02/2015   OAB (overactive bladder) 09/07/2015   History of melanoma 06/10/2015   Thyroid nodule 06/10/2015   GERD (gastroesophageal reflux disease) 05/25/2015   Chronic venous insufficiency 05/25/2015   Hx of essential hypertension 05/25/2015   Atrial fibrillation (HCC) 04/22/2014   Osteoarthritis, hip, bilateral 03/25/2014   Lumbar and sacral osteoarthritis 12/10/2013    REFERRING PROVIDER: Philip Aspen, Limmie Patricia, MD  REFERRING DIAG: Acute left-sided low back pain with left-sided sciatica [M54.42]   Rationale for Evaluation and Treatment: Rehabilitation  THERAPY DIAG:  Muscle weakness (generalized)  Other abnormalities of gait and mobility  Other low back pain  ONSET DATE: Ongoing with most recently having pain increase about a month ago.   SUBJECTIVE:                                                                                                                                                                                           SUBJECTIVE STATEMENT:  I've had a few down days.  Waking up to early, like 2 am.  After last time "I was sore and had muscle cramps"  I was steadier at the basketball  game this time.   Has a gym at  apartment complex but I don't use it ([employer], step)   EVAL: Pt states that she has "significant" scoliosis and believes that she no longer has any room for her nerves to go through her spine anymore. She has symptoms going from her back down her leg and into her L knee. Tingling and consistent pain when present. She notes most pain with walking and standing for prolonged periods. She likes to walk her dog, but states that they are both slow. Pt finds relief from piriformis stretches on the L side only with pain noted when attempted on the R. She also notes relief with LTR's. Heat helps, meds have not been beneficial at this time. She wears compression stockings everyday due to hx of PVD.   PERTINENT HISTORY:  Chronic edema, clotting disorder, PVD, Bilat Hip replacements, Scoliosis. Rotator cuff tear  PAIN:  Are you having pain? Yes: NPRS scale: 0/10  Pain location: Left leg  Pain description: Tingling Aggravating factors: Wlaking standing for prolonged periods of time.  Relieving factors: Sitting, rest, heat   PRECAUTIONS: None  RED FLAGS: None   WEIGHT BEARING RESTRICTIONS: No  FALLS:  Has patient fallen in last 6 months? 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   NEXT MD VISIT: None scheduled.   OBJECTIVE:  Note: Objective measures were completed at Evaluation unless otherwise noted.  DIAGNOSTIC FINDINGS:  None recent.   PATIENT SURVEYS:  Oswestry: 26/50 52%   COGNITION: Overall cognitive status: Within functional limits for tasks assessed     SENSATION: WFL   POSTURE: rounded shoulders, forward head, and scoliosis.    LUMBAR ROM:   AROM eval  Flexion WFL with hip hinge.   Extension WFL   Right lateral flexion/ Left lateral  flexion:  Pt has increased movement on R> L possibly due to limitations with scoliosis. No pain noted, but reports of fear with L side movement.   Right rotation WFL  Left rotation WFL   (Blank rows = not tested)  LOWER EXTREMITY ROM:     Active  Right eval Left eval  Hip flexion [facility]  Hip extension    Hip abduction    Hip internal rotation    Hip external rotation Mild limitation in seated position Moderate limitation in seated position  Knee flexion 115 111 supine  Knee extension Supine -18 Supine -17   (Blank rows = not tested)  LOWER EXTREMITY MMT:    MMT Right eval Left eval  Hip flexion 4 4-  Knee flexion 4 4  Knee extension 4+ 4+   (Blank rows = not tested)  FUNCTIONAL TESTS:  5 times sit to stand: 14.51 sec 3 minute walk test: 459 ft with forward flexed trunk and slow antalgic gait. Pt has minimal trunk rotation.   GAIT: Distance walked: 54ft Assistive device utilized: None Level of assistance: Complete Independence Comments: forward flexed trunk and slow antalgic gait. Pt has minimal trunk rotation.  TREATMENT DATE:  03/30/23:     Nu-Step L3 (blue machine) 8 min while discussing status Seated 8# chops, hip to hip combination 10x Sit to stand holding 8#  3x; sit to stand with overhead press 8# 3x Modified Dead lifts to knee level pair of 8# 10x  Standing core strengthening series holding pair of 8 pound dumbbells while marching sets of 5 reps each:  1) Farmers hold; 2) single at the shoulder hold; 3) single overhead press hold  Bil farmer's carry 80 feet; single side carry 40 feet x2 Lat bar 25# 2 sets of 10x Leg press seat 8 75# 20x; single leg 35# 20x right/left Hurdles lateral and forward: able to do 2 laps without UE  2nd step hip flexor stretching with arm elevation 5x each side 2nd step HS stretch 5x each side RPE 3/10  03/24/23:     Nu-Step L5 (blue machine) 10 min while discussing status Seated 8# chops, hip to hip 10x Sit to stand holding 8#   5x Staggered stance at the railing 8# dead lifts Farmers hold 8# with same side hip flexion  Farmer's carry walk 8#: 25 feet 6 laps Modified Dead lifts to knee level pair of 8# 10x Lat bar 25# 2 sets of 10x Leg press seat 8 70# 2 sets of 10x RPE 5/10  03/22/23:     Nu-Step L5 (green machine) 7 min while discussing status Seated 4# plyo ball chops 10x each way (added to HEP) Seated 4# plyo ball hip to hip 10x  Sit to stand holding 4# plyo ball 2 sets of 5 (added to HEP) 2nd step stretch series: hip flexor stretch, and added UE reach up and over 6 inch step ups: 2 sets of 5 right/left Standing: bil shoulder rows green band 10x (added to HEP) Standing: bil shoulder extension green band 10x to activate lower abdominals (added to HEP) Standing dead lifts pair of 4# 10x (increase weight next time) Standing: farmer's carry with marching (attempted BOSU taps but loss of balance requiring assist to recover) Standing: single arm hold at shoulder with marching Farmer's carry bil 4# around the gym 1 lap  03/17/23:     Anti-inflammatory strategies 2nd step stretch series: hip flexor stretch, and added UE reach up and over Nu-Step L1 (blue machine) 6 min Seated abdominal activation with green ball in lap 5 sec hold 10x Seated 4# plyo ball chops 10x each way Rise 2x from mat table no hands Standing: bil shoulder extension green band 10x to activate lower abdominals Standing: single arm shoulder extension green band with opposite hip flexion 5x right/left (difficulty balancing) Standing: isometric shoulder extension green band hold with marching 2 sets of 5                                                                                                                              PATIENT EDUCATION:  Education details: Educated pt on anatomy and physiology of current symptoms, FOTO, diagnosis, prognosis, HEP,  and POC. Person educated: Patient Education method: Software engineer Education comprehension: verbalized understanding and returned demonstration  HOME EXERCISE PROGRAM: Access Code: [email or web address] URL: [email or web address] Date: 03/22/2023 Prepared by: Lavinia Sharps  Exercises - Supine Hamstring Stretch with Strap  - 2 x  daily - 7 x weekly - 2 sets - 2 reps - 30 hold - Seated Hamstring Stretch  - 2 x daily - 7 x weekly - 2 sets - 2 reps - 30 hold - Supine Bridge  - 1 x daily - 7 x weekly - 3 sets - 10 reps - Supine Lower Trunk Rotation  - 1 x daily - 7 x weekly - 3 sets - 10 reps - Seated Diagonal Chop with Medicine Ball  - 1 x daily - 7 x weekly - 1 sets - 10 reps - Sit to Stand  - 1 x daily - 7 x weekly - 2 sets - 5 reps - Standing Bilateral Low Shoulder Row with Anchored Resistance  - 1 x daily - 7 x weekly - 1 sets - 10 reps - Shoulder Extension with Resistance Hands Down  - 1 x daily - 7 x weekly - 1 sets - 10 reps  ASSESSMENT:  CLINICAL IMPRESSION:  Patient able to progress weights/resistance with some exercises today without pain in left leg. Functional improvements include standing in line easier, better balance on the bleachers and able to sleep on that side now.  Fewer cues for technique needed with hip hinge/dead lifting.   Despite increase in exercise intensity [name] rates her perceive exertion low at 3/10.   OBJECTIVE IMPAIRMENTS: decreased activity tolerance, difficulty walking, decreased balance, decreased endurance, decreased mobility, decreased ROM, decreased strength, impaired flexibility, impaired UE/LE use, postural dysfunction, and pain.  ACTIVITY LIMITATIONS: bending, lifting, carry, locomotion, cleaning, community activity, driving, and or occupation  PERSONAL FACTORS: Chronic edema, clotting disorder, PVD, Bilat Hip replacements, Scoliosis. are also affecting patient's functional outcome.  REHAB POTENTIAL: Good  CLINICAL DECISION MAKING: Stable/uncomplicated  EVALUATION COMPLEXITY:  Low    GOALS: Short term PT Goals Target date: 03/17/2023 Pt will be I and compliant with HEP. Baseline:  Goal status: met 1/8 Pt will decrease pain by 25% overall Baseline: Goal status: met 1/8  Long term PT goals Target date: 04/21/2023 Pt will improve ROM to [facility] to improve functional mobility Baseline: Goal status: New Pt will improve  hip/knee strength to at least 5-/5 MMT to improve functional strength Baseline: Goal status: New Pt will improve OSWESTRY to at least 70% functional to show improved function Baseline: Goal status: New Pt will reduce pain by overall 50% overall with usual activity Baseline: Goal status: New Pt will be able to stand for >10 minutes without reports of burning in her thighs. Baseline: Goal status: New Pt will be able to ambulate community distances at least 1000 ft WNL gait pattern without complaints Baseline: Goal status: New  PLAN: PT FREQUENCY: 1-2 times per week   PT DURATION: 4-6 weeks  PLANNED INTERVENTIONS (unless contraindicated): aquatic PT, Canalith repositioning, cryotherapy, Electrical stimulation, Iontophoresis with 4 mg/ml dexamethasome, Moist heat, traction, Ultrasound, gait training, Therapeutic exercise, balance training, neuromuscular re-education, patient/family education, prosthetic training, manual techniques, passive ROM, dry needling, taping, vasopnuematic device, vestibular, spinal manipulations, joint manipulations  PLAN FOR NEXT SESSION:   continuing PT 2 more visits to establish ex program; pt plans on going to complex gym or go to [employer]; Strengthen and stretch bilat LE's. Work on core strengthening, balance and gait. Single leg balance  Lavinia Sharps, PT 03/30/23 8:41 AM Phone: [phone] Fax: [phone]

## 2023-04-05 ENCOUNTER — Encounter: Payer: PPO | Admitting: Physical Therapy

## 2023-04-06 ENCOUNTER — Ambulatory Visit: Payer: PPO | Admitting: Physical Therapy

## 2023-04-06 DIAGNOSIS — M6281 Muscle weakness (generalized): Secondary | ICD-10-CM

## 2023-04-06 DIAGNOSIS — M5459 Other low back pain: Secondary | ICD-10-CM

## 2023-04-06 DIAGNOSIS — R2689 Other abnormalities of gait and mobility: Secondary | ICD-10-CM

## 2023-04-06 NOTE — Therapy (Signed)
OUTPATIENT PHYSICAL THERAPY THORACOLUMBAR PROGRESS NOTE   Patient Name: [name] MRN: [id number] DOB:Apr 14, 1950, 73 y.o., female Today's Date: 04/06/2023  END OF SESSION:  PT End of Session - 04/06/23 0751     Visit Number 6    Date for PT Re-Evaluation 04/21/23    Authorization Type [employer]    Progress Note Due on Visit 10    PT Start Time 0755    PT Stop Time 0835    PT Time Calculation (min) 40 min    Activity Tolerance Patient tolerated treatment well             Past Medical History:  Diagnosis Date   Chronic edema    Clotting disorder (HCC)    Constipation    Difficult intubation    pt states that her neck needs to be in a neutral position,  scoliosisand herniated dics in neck   Family history of colon cancer    Family history of uterine cancer    Gallstones    GERD (gastroesophageal reflux disease)    protonix, hx h. pylori   Herniated disc, cervical    History of hiatal hernia    History of sebaceous adenoma    History of uterine cancer 04/22/2014   sees Dr. 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 Sharyn Lull in dermatology right upper arm   Obesity    OSA on CPAP    uses CPAP   Paroxysmal atrial fibrillation (HCC)    Peripheral vascular disease (HCC)    Personal history of radiation therapy    PONV (postoperative nausea and vomiting)    Recurrent UTI    S/P hip replacement    Scoliosis    Sleep apnea    Thyroid nodule    Urinary incontinence    Uterine cancer (HCC)    Stage 1   Venous insufficiency    s/p ablation   Past Surgical History:  Procedure Laterality Date   ABDOMINAL HYSTERECTOMY  09/10/2013   ATRIAL FIBRILLATION ABLATION N/A 08/09/2019   Procedure: ATRIAL FIBRILLATION ABLATION;  Surgeon: Regan Lemming, MD;  Location: [facility];  Service: Cardiovascular;  Laterality: N/A;   BREAST LUMPECTOMY WITH RADIOACTIVE  SEED AND SENTINEL LYMPH NODE BIOPSY Left 02/16/2021   Procedure: LEFT BREAST LUMPECTOMY WITH RADIOACTIVE SEED X2 AND SENTINEL LYMPH NODE BIOPSY;  Surgeon: Harriette Bouillon, MD;  Location: [facility];  Service: General;  Laterality: Left;   BUNIONECTOMY  10/1976   CHOLECYSTECTOMY N/A 03/12/2019   Procedure: XI ROBOTIC ASSISTED CHOLECYSTECTOMY;  Surgeon: Berna Bue, MD;  Location: [facility];  Service: General;  Laterality: N/A;   COLONOSCOPY     COLONOSCOPY WITH PROPOFOL N/A 01/17/2020   Procedure: COLONOSCOPY WITH PROPOFOL;  Surgeon: Lynann Bologna, MD;  Location: [facility];  Service: Endoscopy;  Laterality: N/A;   DRUG INDUCED ENDOSCOPY N/A 03/04/2020   Procedure: DRUG INDUCED ENDOSCOPY;  Surgeon: Christia Reading, MD;  Location: [facility];  Service: ENT;  Laterality: N/A;   FRACTURE SURGERY  09/1964   floor of orbit right side   HIATAL HERNIA REPAIR     IMPLANTATION OF HYPOGLOSSAL NERVE STIMULATOR Right 04/15/2020   Procedure: IMPLANTATION OF HYPOGLOSSAL NERVE STIMULATOR;  Surgeon: Christia Reading, MD;  Location: [facility];  Service: ENT;  Laterality: Right;   JOINT REPLACEMENT     bilateral hip replacements  MELANOMA EXCISION  12/2011   POLYPECTOMY  01/17/2020   Procedure: POLYPECTOMY;  Surgeon: Lynann Bologna, MD;  Location: [facility];  Service: Endoscopy;;   TOTAL HIP ARTHROPLASTY Left 06/17/2014   Procedure: LEFT TOTAL HIP ARTHROPLASTY ANTERIOR APPROACH;  Surgeon: Kathryne Hitch, MD;  Location: [facility];  Service: Orthopedics;  Laterality: Left;   TOTAL HIP ARTHROPLASTY Right 09/02/2014   Procedure: RIGHT TOTAL HIP ARTHROPLASTY ANTERIOR APPROACH;  Surgeon: Kathryne Hitch, MD;  Location: [facility];  Service: Orthopedics;  Laterality: Right;   VEIN SURGERY  05/2011   venous ablation    Patient Active Problem List   Diagnosis Date Noted   S/P placement of hypoglossal nerve stimulator 01/16/2023   Macrocytosis 12/28/2021   Malignant  neoplasm of upper-outer quadrant of left breast in female, estrogen receptor positive (HCC) 01/07/2021   Vitamin D deficiency 12/01/2020   Intolerance of continuous positive airway pressure (CPAP) ventilation 05/13/2020   Severe obstructive sleep apnea-hypopnea syndrome 05/13/2020   Hx of colonic polyps    Adenomatous polyp of ascending colon    Genetic testing 06/24/2019   Family history of uterine cancer    Family history of colon cancer    History of uterine cancer    History of sebaceous adenoma    S/P repair of paraesophageal hernia 03/12/2019   Syncope 02/27/2018   Community acquired pneumonia 02/10/2018   CAP (community acquired pneumonia) 02/10/2018   Long term current use of antiarrhythmic drug 11/02/2015   Varicose veins 11/02/2015   OAB (overactive bladder) 09/07/2015   History of melanoma 06/10/2015   Thyroid nodule 06/10/2015   GERD (gastroesophageal reflux disease) 05/25/2015   Chronic venous insufficiency 05/25/2015   Hx of essential hypertension 05/25/2015   Atrial fibrillation (HCC) 04/22/2014   Osteoarthritis, hip, bilateral 03/25/2014   Lumbar and sacral osteoarthritis 12/10/2013    REFERRING PROVIDER: Philip Aspen, Limmie Patricia, MD  REFERRING DIAG: Acute left-sided low back pain with left-sided sciatica [M54.42]   Rationale for Evaluation and Treatment: Rehabilitation  THERAPY DIAG:  Muscle weakness (generalized)  Other low back pain  Other abnormalities of gait and mobility  ONSET DATE: Ongoing with most recently having pain increase about a month ago.   SUBJECTIVE:                                                                                                                                                                                           SUBJECTIVE STATEMENT: I'm up early. I've been aware that I can do curbs better when I walk the dog.  I'm enjoying my walks more.I have a bad neck (have seen a neurologist and surgeon).  I've had some  numbness in my little finger but it's not there now. It doesn't have anything to do with what I'm doing there.    Has a gym at apartment complex but I don't use it (BOS[employer], step)   EVAL: Pt states that she has "significant" scoliosis and believes that she no longer has any room for her nerves to go through her spine anymore. She has symptoms going from her back down her leg and into her L knee. Tingling and consistent pain when present. She notes most pain with walking and standing for prolonged periods. She likes to walk her dog, but states that they are both slow. Pt finds relief from piriformis stretches on the L side only with pain noted when attempted on the R. She also notes relief with LTR's. Heat helps, meds have not been beneficial at this time. She wears compression stockings everyday due to hx of PVD.   PERTINENT HISTORY:  Chronic edema, clotting disorder, PVD, Bilat Hip replacements, Scoliosis. Rotator cuff tear  PAIN:  Are you having pain? Yes: NPRS scale: 0/10  Pain location: Left leg  Pain description: Tingling Aggravating factors: Wlaking standing for prolonged periods of time.  Relieving factors: Sitting, rest, heat   PRECAUTIONS: None  RED FLAGS: None   WEIGHT BEARING RESTRICTIONS: No  FALLS:  Has patient fallen in last 6 months? 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   NEXT MD VISIT: None scheduled.   OBJECTIVE:  Note: Objective measures were completed at Evaluation unless otherwise noted.  DIAGNOSTIC FINDINGS:  None recent.   PATIENT SURVEYS:  Oswestry: 26/50 52%   COGNITION: Overall cognitive status: Within functional limits for tasks assessed     SENSATION: WFL   POSTURE: rounded shoulders, forward head, and scoliosis.     LUMBAR ROM:   AROM eval  Flexion WFL with hip hinge.   Extension WFL   Right lateral flexion/ Left lateral flexion:  Pt has increased movement on R> L possibly due to limitations with scoliosis. No pain noted, but reports of fear with L side movement.   Right rotation WFL  Left rotation WFL   (Blank rows = not tested)  LOWER EXTREMITY ROM:     Active  Right eval Left eval  Hip flexion [facility]  Hip extension    Hip abduction    Hip internal rotation    Hip external rotation Mild limitation in seated position Moderate limitation in seated position  Knee flexion 115 111 supine  Knee extension Supine -18 Supine -17   (Blank rows = not tested)  LOWER EXTREMITY MMT:    MMT Right eval Left eval  Hip flexion 4 4-  Knee flexion 4 4  Knee extension 4+ 4+   (Blank rows = not tested)  FUNCTIONAL TESTS:  5 times sit to stand: 14.51 sec 3 minute walk test: 459 ft with forward flexed trunk and slow antalgic gait. Pt has minimal trunk rotation.   GAIT: Distance walked: 527ft Assistive device utilized: None Level of assistance: Complete Independence Comments: forward flexed trunk and slow antalgic gait. Pt has minimal trunk rotation.  TREATMENT DATE:  04/06/23:     Nu-Step L3 (blue machine) 8 min while discussing status 2 rounds of the next 4 exercises: 2 min rest between  rounds Seated 8# chops,8x Sit to stand holding 8#  8x Modified Dead lifts to knee level pair of 8# 8x Farmers carry: bil 8# weights 1 lap Leg press seat 8 80# 20x; single leg 35# 20x right/left Dynamic balance: clock yourself app 50 spm 2 minutes simple clock taps Dynamic balance with circles on floor stepping stones, zig zag walk  03/30/23:     Nu-Step L3 (blue machine) 8 min while discussing status Seated 8# chops, hip to hip combination 10x Sit to stand holding 8#  3x; sit to stand with overhead press 8# 3x Modified Dead lifts to knee level pair of 8# 10x Standing core strengthening series holding  pair of 8 pound dumbbells while marching sets of 5 reps each:  1) Farmers hold; 2) single at the shoulder hold; 3) single overhead press hold  Bil farmer's carry 80 feet; single side carry 40 feet x2 Lat bar 25# 2 sets of 10x Leg press seat 8 75# 20x; single leg 35# 20x right/left Hurdles lateral and forward: able to do 2 laps without UE  2nd step hip flexor stretching with arm elevation 5x each side 2nd step HS stretch 5x each side RPE 3/10  03/24/23:     Nu-Step L5 (blue machine) 10 min while discussing status Seated 8# chops, hip to hip 10x Sit to stand holding 8#  5x Staggered stance at the railing 8# dead lifts Farmers hold 8# with same side hip flexion  Farmer's carry walk 8#: 25 feet 6 laps Modified Dead lifts to knee level pair of 8# 10x Lat bar 25# 2 sets of 10x Leg press seat 8 70# 2 sets of 10x RPE 5/10  03/22/23:     Nu-Step L5 (green machine) 7 min while discussing status Seated 4# plyo ball chops 10x each way (added to HEP) Seated 4# plyo ball hip to hip 10x  Sit to stand holding 4# plyo ball 2 sets of 5 (added to HEP) 2nd step stretch series: hip flexor stretch, and added UE reach up and over 6 inch step ups: 2 sets of 5 right/left Standing: bil shoulder rows green band 10x (added to HEP) Standing: bil shoulder extension green band 10x to activate lower abdominals (added to HEP) Standing dead lifts pair of 4# 10x (increase weight next time) Standing: farmer's carry with marching (attempted BOSU taps but loss of balance requiring assist to recover) Standing: single arm hold at shoulder with marching Farmer's carry bil 4# around the gym 1 lap     PATIENT EDUCATION:  Education details: Educated pt on anatomy and physiology of current symptoms, FOTO, diagnosis, prognosis, HEP,  and POC. Person educated: Patient Education method: Medical illustrator Education comprehension: verbalized understanding and returned demonstration  HOME EXERCISE PROGRAM: Access  Code: [email or web address] URL: [email or web address] Date: 03/22/2023 Prepared by: Lavinia Sharps  Exercises - Supine Hamstring Stretch with Strap  - 2 x daily - 7 x weekly - 2 sets - 2 reps - 30 hold - Seated Hamstring Stretch  - 2 x daily - 7 x weekly - 2 sets - 2 reps - 30 hold - Supine Bridge  - 1 x daily - 7 x weekly - 3 sets - 10 reps - Supine Lower Trunk Rotation  - 1 x daily - 7 x weekly - 3 sets - 10 reps - Seated Diagonal Chop with Medicine Ball  - 1 x daily - 7 x weekly - 1 sets - 10 reps - Sit to Stand  - 1 x daily -  7 x weekly - 2 sets - 5 reps - Standing Bilateral Low Shoulder Row with Anchored Resistance  - 1 x daily - 7 x weekly - 1 sets - 10 reps - Shoulder Extension with Resistance Hands Down  - 1 x daily - 7 x weekly - 1 sets - 10 reps  ASSESSMENT:  CLINICAL IMPRESSION:  Tekeshia notes improving stability overall when she walks her dog and negotiates curbs.  Used rounds of exercise for higher intensity with good response.  Much improved hip hinge technique without cues today.  Challenged by backwards stepping and single leg balance with occasional touch support needed and close supervision from therapist for safety.    OBJECTIVE IMPAIRMENTS: decreased activity tolerance, difficulty walking, decreased balance, decreased endurance, decreased mobility, decreased ROM, decreased strength, impaired flexibility, impaired UE/LE use, postural dysfunction, and pain.  ACTIVITY LIMITATIONS: bending, lifting, carry, locomotion, cleaning, community activity, driving, and or occupation  PERSONAL FACTORS: Chronic edema, clotting disorder, PVD, Bilat Hip replacements, Scoliosis. are also affecting patient's functional outcome.  REHAB POTENTIAL: Good  CLINICAL DECISION MAKING: Stable/uncomplicated  EVALUATION COMPLEXITY: Low    GOALS: Short term PT Goals Target date: 03/17/2023 Pt will be I and compliant with HEP. Baseline:  Goal status: met 1/8 Pt will decrease pain by 25%  overall Baseline: Goal status: met 1/8  Long term PT goals Target date: 04/21/2023 Pt will improve ROM to [facility] to improve functional mobility Baseline: Goal status: New Pt will improve  hip/knee strength to at least 5-/5 MMT to improve functional strength Baseline: Goal status: New Pt will improve OSWESTRY to at least 70% functional to show improved function Baseline: Goal status: New Pt will reduce pain by overall 50% overall with usual activity Baseline: Goal status: New Pt will be able to stand for >10 minutes without reports of burning in her thighs. Baseline: Goal status: New Pt will be able to ambulate community distances at least 1000 ft WNL gait pattern without complaints Baseline: Goal status: New  PLAN: PT FREQUENCY: 1-2 times per week   PT DURATION: 4-6 weeks  PLANNED INTERVENTIONS (unless contraindicated): aquatic PT, Canalith repositioning, cryotherapy, Electrical stimulation, Iontophoresis with 4 mg/ml dexamethasome, Moist heat, traction, Ultrasound, gait training, Therapeutic exercise, balance training, neuromuscular re-education, patient/family education, prosthetic training, manual techniques, passive ROM, dry needling, taping, vasopnuematic device, vestibular, spinal manipulations, joint manipulations  PLAN FOR NEXT SESSION:   probable discharge next visit; Oswestry; check goals; establish ex program; pt plans on going to complex gym or go to [employer]; Strengthen and stretch bilat LE's. Work on core strengthening, balance and gait. Single leg balance  Lavinia Sharps, PT 04/06/23 8:43 AM Phone: [phone] Fax: [phone]

## 2023-04-11 ENCOUNTER — Ambulatory Visit: Payer: PPO | Admitting: Physical Therapy

## 2023-04-11 DIAGNOSIS — M6281 Muscle weakness (generalized): Secondary | ICD-10-CM | POA: Diagnosis not present

## 2023-04-11 DIAGNOSIS — M5459 Other low back pain: Secondary | ICD-10-CM

## 2023-04-11 NOTE — Therapy (Signed)
OUTPATIENT PHYSICAL THERAPY THORACOLUMBAR PROGRESS NOTE/DISCHARGE SUMMARY   Patient Name: [name] MRN: [id number] DOB:[birth year], 73 y.o., female Today's Date: 04/11/2023  END OF SESSION:  PT End of Session - 04/11/23 0753     Visit Number 7    Date for PT Re-Evaluation 04/21/23    Authorization Type [employer]    Progress Note Due on Visit 10    PT Start Time 0753    PT Stop Time 0835    PT Time Calculation (min) 42 min    Activity Tolerance Patient tolerated treatment well             Past Medical History:  Diagnosis Date   Chronic edema    Clotting disorder (HCC)    Constipation    Difficult intubation    pt states that her neck needs to be in a neutral position,  scoliosisand herniated dics in neck   Family history of colon cancer    Family history of uterine cancer    Gallstones    GERD (gastroesophageal reflux disease)    protonix, hx h. pylori   Herniated disc, cervical    History of hiatal hernia    History of sebaceous adenoma    History of uterine cancer 04/22/2014   sees Dr. 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 Sharyn Lull in dermatology right upper arm   Obesity    OSA on CPAP    uses CPAP   Paroxysmal atrial fibrillation (HCC)    Peripheral vascular disease (HCC)    Personal history of radiation therapy    PONV (postoperative nausea and vomiting)    Recurrent UTI    S/P hip replacement    Scoliosis    Sleep apnea    Thyroid nodule    Urinary incontinence    Uterine cancer (HCC)    Stage 1   Venous insufficiency    s/p ablation   Past Surgical History:  Procedure Laterality Date   ABDOMINAL HYSTERECTOMY  09/10/2013   ATRIAL FIBRILLATION ABLATION N/A 08/09/2019   Procedure: ATRIAL FIBRILLATION ABLATION;  Surgeon: Regan Lemming, MD;  Location: [facility];  Service: Cardiovascular;  Laterality: N/A;   BREAST LUMPECTOMY  WITH RADIOACTIVE SEED AND SENTINEL LYMPH NODE BIOPSY Left 02/16/2021   Procedure: LEFT BREAST LUMPECTOMY WITH RADIOACTIVE SEED X2 AND SENTINEL LYMPH NODE BIOPSY;  Surgeon: Harriette Bouillon, MD;  Location: [facility];  Service: General;  Laterality: Left;   BUNIONECTOMY  10/1976   CHOLECYSTECTOMY N/A 03/12/2019   Procedure: XI ROBOTIC ASSISTED CHOLECYSTECTOMY;  Surgeon: Berna Bue, MD;  Location: [facility];  Service: General;  Laterality: N/A;   COLONOSCOPY     COLONOSCOPY WITH PROPOFOL N/A 01/17/2020   Procedure: COLONOSCOPY WITH PROPOFOL;  Surgeon: Lynann Bologna, MD;  Location: [facility];  Service: Endoscopy;  Laterality: N/A;   DRUG INDUCED ENDOSCOPY N/A 03/04/2020   Procedure: DRUG INDUCED ENDOSCOPY;  Surgeon: Christia Reading, MD;  Location: [facility];  Service: ENT;  Laterality: N/A;   FRACTURE SURGERY  09/1964   floor of orbit right side   HIATAL HERNIA REPAIR     IMPLANTATION OF HYPOGLOSSAL NERVE STIMULATOR Right 04/15/2020   Procedure: IMPLANTATION OF HYPOGLOSSAL NERVE STIMULATOR;  Surgeon: Christia Reading, MD;  Location: [facility];  Service: ENT;  Laterality: Right;   JOINT REPLACEMENT     bilateral hip replacements  MELANOMA EXCISION  12/2011   POLYPECTOMY  01/17/2020   Procedure: POLYPECTOMY;  Surgeon: Lynann Bologna, MD;  Location: [facility];  Service: Endoscopy;;   TOTAL HIP ARTHROPLASTY Left 06/17/2014   Procedure: LEFT TOTAL HIP ARTHROPLASTY ANTERIOR APPROACH;  Surgeon: Kathryne Hitch, MD;  Location: [facility];  Service: Orthopedics;  Laterality: Left;   TOTAL HIP ARTHROPLASTY Right 09/02/2014   Procedure: RIGHT TOTAL HIP ARTHROPLASTY ANTERIOR APPROACH;  Surgeon: Kathryne Hitch, MD;  Location: [facility];  Service: Orthopedics;  Laterality: Right;   VEIN SURGERY  05/2011   venous ablation    Patient Active Problem List   Diagnosis Date Noted   S/P placement of hypoglossal nerve stimulator 01/16/2023   Macrocytosis 12/28/2021    Malignant neoplasm of upper-outer quadrant of left breast in female, estrogen receptor positive (HCC) 01/07/2021   Vitamin D deficiency 12/01/2020   Intolerance of continuous positive airway pressure (CPAP) ventilation 05/13/2020   Severe obstructive sleep apnea-hypopnea syndrome 05/13/2020   Hx of colonic polyps    Adenomatous polyp of ascending colon    Genetic testing 06/24/2019   Family history of uterine cancer    Family history of colon cancer    History of uterine cancer    History of sebaceous adenoma    S/P repair of paraesophageal hernia 03/12/2019   Syncope 02/27/2018   Community acquired pneumonia 02/10/2018   CAP (community acquired pneumonia) 02/10/2018   Long term current use of antiarrhythmic drug 11/02/2015   Varicose veins 11/02/2015   OAB (overactive bladder) 09/07/2015   History of melanoma 06/10/2015   Thyroid nodule 06/10/2015   GERD (gastroesophageal reflux disease) 05/25/2015   Chronic venous insufficiency 05/25/2015   Hx of essential hypertension 05/25/2015   Atrial fibrillation (HCC) 04/22/2014   Osteoarthritis, hip, bilateral 03/25/2014   Lumbar and sacral osteoarthritis 12/10/2013    REFERRING PROVIDER: Philip Aspen, Limmie Patricia, MD  REFERRING DIAG: Acute left-sided low back pain with left-sided sciatica [M54.42]   Rationale for Evaluation and Treatment: Rehabilitation  THERAPY DIAG:  Muscle weakness (generalized)  Other low back pain  ONSET DATE: Ongoing with most recently having pain increase about a month ago.   SUBJECTIVE:                                                                                                                                                                                           SUBJECTIVE STATEMENT: Doing OK although the day before last I had searing leg pain while sitting but I stood up and walked and it went away.  Walks the dog 3-4x day for 15 minutes.  80% better overall.  Has a  gym at apartment complex  but I don't use it ([employer], step)   EVAL: Pt states that she has "significant" scoliosis and believes that she no longer has any room for her nerves to go through her spine anymore. She has symptoms going from her back down her leg and into her L knee. Tingling and consistent pain when present. She notes most pain with walking and standing for prolonged periods. She likes to walk her dog, but states that they are both slow. Pt finds relief from piriformis stretches on the L side only with pain noted when attempted on the R. She also notes relief with LTR's. Heat helps, meds have not been beneficial at this time. She wears compression stockings everyday due to hx of PVD.   PERTINENT HISTORY:  Chronic edema, clotting disorder, PVD, Bilat Hip replacements, Scoliosis. Rotator cuff tear  PAIN:  Are you having pain? Yes: NPRS scale: 0/10  Pain location: Left leg  Pain description: Tingling Aggravating factors: Wlaking standing for prolonged periods of time.  Relieving factors: Sitting, rest, heat   PRECAUTIONS: None  RED FLAGS: None   WEIGHT BEARING RESTRICTIONS: No  FALLS:  Has patient fallen in last 6 months? 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   NEXT MD VISIT: None scheduled.   OBJECTIVE:  Note: Objective measures were completed at Evaluation unless otherwise noted.  DIAGNOSTIC FINDINGS:  None recent.   PATIENT SURVEYS:  Oswestry: 26/50 52%   1/28: 14%  COGNITION: Overall cognitive status: Within functional limits for tasks assessed     SENSATION: WFL   POSTURE: rounded shoulders, forward head, and scoliosis.    LUMBAR ROM:   AROM eval 1/28  Flexion WFL with hip hinge.  WFL  Extension WFL    Right lateral flexion/ Left lateral  flexion:  Pt has increased movement on R> L possibly due to limitations with scoliosis. No pain noted, but reports of fear with L side movement.   WFL  Right rotation WFL   Left rotation WFL    (Blank rows = not tested)  LOWER EXTREMITY ROM:     Active  Right eval Left eval  Hip flexion [facility]  Hip extension    Hip abduction    Hip internal rotation    Hip external rotation Mild limitation in seated position Moderate limitation in seated position  Knee flexion 115 111 supine  Knee extension Supine -18 Supine -17   (Blank rows = not tested)  LOWER EXTREMITY MMT:    MMT Right eval Left eval 1/28  Hip flexion 4 4- 4+ R/L  Knee flexion 4 4 4+ R/L  Knee extension 4+ 4+ 5- R/L   (Blank rows = not tested)  FUNCTIONAL TESTS:  5 times sit to stand: 14.51 sec 3 minute walk test: 459 ft with forward flexed trunk and slow antalgic gait. Pt has minimal trunk rotation.   1/28:   5x STS 10.63 3 MWT: 539 sec feet  GAIT: Distance walked: 565ft Assistive device utilized: None Level of assistance: Complete Independence Comments: forward flexed trunk and slow antalgic gait. Pt has minimal trunk rotation.  TREATMENT DATE:  04/11/23:     Nu-Step L3 (blue machine) 8 min while discussing status Modified Oswestry 3 MWT 5x STS Lumbar ROM Strength assessment Dynamic  balance with circles on floor (forwards and backward movement) and numbers on wall with added cognitive challenge  04/06/23:     Nu-Step L3 (blue machine) 8 min while discussing status 2 rounds of the next 4 exercises: 2 min rest between rounds Seated 8# chops,8x Sit to stand holding 8#  8x Modified Dead lifts to knee level pair of 8# 8x Farmers carry: bil 8# weights 1 lap Leg press seat 8 80# 20x; single leg 35# 20x right/left Dynamic balance: clock yourself app 50 spm 2 minutes simple clock taps Dynamic balance with circles on floor stepping stones, zig zag walk  03/30/23:     Nu-Step L3 (blue machine) 8 min while  discussing status Seated 8# chops, hip to hip combination 10x Sit to stand holding 8#  3x; sit to stand with overhead press 8# 3x Modified Dead lifts to knee level pair of 8# 10x Standing core strengthening series holding pair of 8 pound dumbbells while marching sets of 5 reps each:  1) Farmers hold; 2) single at the shoulder hold; 3) single overhead press hold  Bil farmer's carry 80 feet; single side carry 40 feet x2 Lat bar 25# 2 sets of 10x Leg press seat 8 75# 20x; single leg 35# 20x right/left Hurdles lateral and forward: able to do 2 laps without UE  2nd step hip flexor stretching with arm elevation 5x each side 2nd step HS stretch 5x each side RPE 3/10  03/24/23:     Nu-Step L5 (blue machine) 10 min while discussing status Seated 8# chops, hip to hip 10x Sit to stand holding 8#  5x Staggered stance at the railing 8# dead lifts Farmers hold 8# with same side hip flexion  Farmer's carry walk 8#: 25 feet 6 laps Modified Dead lifts to knee level pair of 8# 10x Lat bar 25# 2 sets of 10x Leg press seat 8 70# 2 sets of 10x RPE 5/10      PATIENT EDUCATION:  Education details: Educated pt on anatomy and physiology of current symptoms, FOTO, diagnosis, prognosis, HEP,  and POC. Person educated: Patient Education method: Medical illustrator Education comprehension: verbalized understanding and returned demonstration  HOME EXERCISE PROGRAM: Access Code: [email or web address] URL: [email or web address] Date: 03/22/2023 Prepared by: Lavinia Sharps  Exercises - Supine Hamstring Stretch with Strap  - 2 x daily - 7 x weekly - 2 sets - 2 reps - 30 hold - Seated Hamstring Stretch  - 2 x daily - 7 x weekly - 2 sets - 2 reps - 30 hold - Supine Bridge  - 1 x daily - 7 x weekly - 3 sets - 10 reps - Supine Lower Trunk Rotation  - 1 x daily - 7 x weekly - 3 sets - 10 reps - Seated Diagonal Chop with Medicine Ball  - 1 x daily - 7 x weekly - 1 sets - 10 reps - Sit to Stand  - 1 x  daily - 7 x weekly - 2 sets - 5 reps - Standing Bilateral Low Shoulder Row with Anchored Resistance  - 1 x daily - 7 x weekly - 1 sets - 10 reps - Shoulder Extension with Resistance Hands Down  - 1 x daily - 7 x weekly - 1 sets - 10 reps  ASSESSMENT:  CLINICAL IMPRESSION:  The patient has met the majority of rehab goals, with noted improvements in pain reduction, outcome score, strength and functional mobility.  A comprehensive HEP has been established and anticipate further improvements over time  with regular performance of the program.  Recommend discharge from PT at this time.   OBJECTIVE IMPAIRMENTS: decreased activity tolerance, difficulty walking, decreased balance, decreased endurance, decreased mobility, decreased ROM, decreased strength, impaired flexibility, impaired UE/LE use, postural dysfunction, and pain.  ACTIVITY LIMITATIONS: bending, lifting, carry, locomotion, cleaning, community activity, driving, and or occupation  PERSONAL FACTORS: Chronic edema, clotting disorder, PVD, Bilat Hip replacements, Scoliosis. are also affecting patient's functional outcome.  REHAB POTENTIAL: Good  CLINICAL DECISION MAKING: Stable/uncomplicated  EVALUATION COMPLEXITY: Low    GOALS: Short term PT Goals Target date: 03/17/2023 Pt will be I and compliant with HEP. Baseline:  Goal status: met 1/8 Pt will decrease pain by 25% overall Baseline: Goal status: met 1/8  Long term PT goals Target date: 04/21/2023 Pt will improve ROM to [facility] to improve functional mobility Baseline: Goal status: met 1/28 Pt will improve  hip/knee strength to at least 5-/5 MMT to improve functional strength Baseline: Goal status: partially met Pt will improve OSWESTRY to at least 70% functional to show improved function Baseline: Goal status:met 1/28 Pt will reduce pain by overall 50% overall with usual activity Baseline: Goal status: met 1/28 Pt will be able to stand for >10 minutes without reports of  burning in her thighs. Baseline: Goal status:met 1/28 Pt will be able to ambulate community distances at least 1000 ft WNL gait pattern without complaints Baseline: Goal status: met 1/28  PLAN: PHYSICAL THERAPY DISCHARGE SUMMARY  Visits from Start of Care: 7  Current functional level related to goals / functional outcomes: See clinical impressions above   Remaining deficits: As above   Education / Equipment: HEP and community based ex program   Patient agrees to discharge. Patient goals were met. Patient is being discharged due to meeting the stated rehab goals.  Lavinia Sharps, PT 04/11/23 8:41 AM Phone: [phone] Fax: [phone]

## 2023-04-24 ENCOUNTER — Ambulatory Visit: Payer: PPO | Admitting: Neurology

## 2023-04-28 ENCOUNTER — Other Ambulatory Visit: Payer: Self-pay | Admitting: Internal Medicine

## 2023-04-28 DIAGNOSIS — M5442 Lumbago with sciatica, left side: Secondary | ICD-10-CM

## 2023-05-15 DIAGNOSIS — H33321 Round hole, right eye: Secondary | ICD-10-CM | POA: Diagnosis not present

## 2023-05-17 ENCOUNTER — Telehealth: Payer: Self-pay | Admitting: Neurology

## 2023-05-17 ENCOUNTER — Ambulatory Visit: Payer: PPO | Admitting: Neurology

## 2023-05-17 VITALS — BP 134/80 | HR 80 | Ht 69.0 in | Wt 239.0 lb

## 2023-05-17 DIAGNOSIS — Z789 Other specified health status: Secondary | ICD-10-CM

## 2023-05-17 DIAGNOSIS — F5104 Psychophysiologic insomnia: Secondary | ICD-10-CM

## 2023-05-17 DIAGNOSIS — Z9682 Presence of neurostimulator: Secondary | ICD-10-CM | POA: Diagnosis not present

## 2023-05-17 DIAGNOSIS — R635 Abnormal weight gain: Secondary | ICD-10-CM | POA: Diagnosis not present

## 2023-05-17 NOTE — Progress Notes (Signed)
 Provider:  Melvyn Novas, MD  Primary Care Physician:  Philip Aspen, Limmie Patricia, MD [address] [place] [address]     Referring Provider: Philip Aspen, Limmie Patricia, Md [address] 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  Chief concern according to patient :  I have recently slept much better, sciatica is much improved, balance improved with PT, and I have slept at times 4 uninterrupted hours . Sometimes I have not been able to fall asleep, can I take Melatonin? Yes.  Meloxicam and Flexaril have been used for back pain- and she has not felt she slept better with it. "I sleep with an open mouth, wake up dry"  - and  may need a mouth tape. Discussed biotene, and Xylol gum or lozenges.   last evaluation for residual AHI on Inspire was in 2022.   Weight has gone up to 235 pounds- BMI back above 35.    Yesterday (9:44 AM)   EE Patient was seen today for an inspire follow up.                            [name] is a 73 y.o. female patient who is here for revisit 02/27/2023 for  INSPIRE follow up. Chronic insomnia, used to have frequent nocturia, recent bronchitis, sciatica pain. In October/ november there were dental problems. Chief concern  :  patient reports she has scoliosis,  sciatica and the pain interferes with sleep. Left knee pain, and she is still waking up 4-5 times most night. She couldn't use the remote to increase stimulation. She uses the device every day but only uses it for 5 hours , because that's when sleep ends.  She remained at V 1.6 and was here rechecked. The remote was reset to 1.7V. she still is overcoming bronchitis.   I urged her to see her orthopedist to discuss non surgical therapy options.     She can benefit from a muscle relaxant, this may increase overall sleep time as well.      [name] is a 82 -year- old Caucasian female patient was seen here  on 08/26/2020. She has currently a UTI and her sleep has been affected. Sleep is better in the sense that she can sleep for intervals of up to three hours , interrupted by bathroom breaks. Her  higher V setting became uncomfortable. The most comfortable setting is 1.7 V now, but its not the most therapeutic.  She did not bring her remote control to this visit (!) . Her tongue motion was noticed at 1.7/ 1.8 V and very strong "buckling" of the tongue- at 1.9V that is now uncomfortable. I an attempt to get a smoother tongue movement we will reduce the Voltage- 0.9V, which was felt as smoother,  then 1.0 V- felt very tolerable . Then 1.2 V-felt OK , tongue is moving.  Then 1.4 V - visible tongue motion, stronger but OK.   This follows a titration on INSPIRE device  08-19-2020.  We were able to establish baseline apnea at 0 VOLT for  92 minutes with an AHI of 48/h. ( SEVERE baseline apnea )  1.         Titration allowed to reduce the severity to Moderate -severe Sleep Apnea/ Hypopnea - at 1.9V there was still an AHI of 16.8/h noted and events spared REM sleep, indicating a central apnea is likely present as well as hypopnea.  2.         The majority of AHI events occurred I supine sleep.   3.         Hypoxemia also persisted and required 1 liter additional oxygen, see attached screenshot.  4.         Regular rhythm by EKG during this study.    RECOMMENDATIONS:  [name] will not treat central apneas or hypoxemia, but reduced the AHI to 16.8 from 48.3/h. This is at the current setting of 1.9 V. The patient required oxygen during this titration study, but I cannot order oxygen based on this study- INSPIRE will not correct hypoxemia. This is a partial treatment and the patient can benefit from weight loss to reduce the AHI further. She is advised  to avoid the supine sleep position, which reduced the AHI to below 10/h.      Review of Systems: Out of a complete 14 system review, the patient complains of only the following symptoms, and all other reviewed systems are negative.:   Social History   Socioeconomic History   Marital status: Divorced    Spouse name: Not on file   Number of children: 2   Years of education: JD   Highest education level: Professional school degree (e.g., MD, DDS, DVM, JD)  Occupational History   Occupation: lawyer/RETIRED  Tobacco Use   Smoking status: Never   Smokeless tobacco: Never  Vaping Use   Vaping status: Never Used  Substance and Sexual Activity   Alcohol use: Not Currently    Comment: rarely at [facility]    Drug use: No   Sexual activity: Not Currently    Birth control/protection: Surgical  Other Topics Concern   Not on file  Social History Narrative   Work or School: retired Pensions consultant      Home Situation: lives alone - takes care of twin grandchildren 7 months in 05/2015      Spiritual Beliefs:       Lifestyle: active, healthy diet      Pt lives alone    Social Drivers of Health   Financial Resource Strain: Low Risk  (12/29/2022)   Overall Financial Resource Strain (CARDIA)    Difficulty of Paying Living Expenses: Not very hard  Food Insecurity: No Food Insecurity (12/29/2022)   Hunger Vital Sign    Worried About Running Out of Food in the Last Year: Never true    Ran Out of Food in the Last Year: Never true  Transportation Needs: No Transportation Needs (12/29/2022)   PRAPARE - Administrator, Civil Service (Medical): No    Lack of Transportation (Non-Medical): No  Physical Activity: Sufficiently Active (12/29/2022)   Exercise Vital Sign    Days of Exercise per Week: 7 days    Minutes of Exercise per Session: 30 min  Stress: No Stress Concern Present (12/29/2022)   [employer] of Occupational Health - Occupational Stress Questionnaire    Feeling of  Stress : Only a little  Social Connections: Moderately Integrated (12/29/2022)   Social Connection and Isolation Panel [NHANES]    Frequency of Communication with Friends and Family: More than  three times a week    Frequency of Social Gatherings with Friends and Family: More than three times a week    Attends Religious Services: More than 4 times per year    Active Member of Clubs or Organizations: Yes    Attends Engineer, structural: More than 4 times per year    Marital Status: Divorced    Family History  Problem Relation Age of Onset   Uterine cancer Mother    Emphysema Mother    Hypertension Father 82   Hyperlipidemia Father    Epilepsy Father 60   Colon cancer Father 28   Leukemia Father    Arthritis Sister 76   Diabetes Brother 18   Cancer Paternal Uncle        unk type   Cancer Maternal Grandmother        unk type possibly breast or skin   Other Paternal Grandmother        influenza    Stroke Paternal Grandfather    Skin cancer Daughter        SCC on back   Esophageal cancer Neg Hx    Rectal cancer Neg Hx    Stomach cancer Neg Hx    Sleep apnea Neg Hx     Past Medical History:  Diagnosis Date   Chronic edema    Clotting disorder (HCC)    Constipation    Difficult intubation    pt states that her neck needs to be in a neutral position,  scoliosisand herniated dics in neck   Family history of colon cancer    Family history of uterine cancer    Gallstones    GERD (gastroesophageal reflux disease)    protonix, hx h. pylori   Herniated disc, cervical    History of hiatal hernia    History of sebaceous adenoma    History of uterine cancer 04/22/2014   sees Dr. 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 Sharyn Lull in dermatology right upper arm   Obesity    OSA on CPAP    uses CPAP   Paroxysmal atrial fibrillation (HCC)    Peripheral vascular disease (HCC)     Personal history of radiation therapy    PONV (postoperative nausea and vomiting)    Recurrent UTI    S/P hip replacement    Scoliosis    Sleep apnea    Thyroid nodule    Urinary incontinence    Uterine cancer (HCC)    Stage 1   Venous insufficiency    s/p ablation    Past Surgical History:  Procedure Laterality Date   ABDOMINAL HYSTERECTOMY  09/10/2013   ATRIAL FIBRILLATION ABLATION N/A 08/09/2019   Procedure: ATRIAL FIBRILLATION ABLATION;  Surgeon: Regan Lemming, MD;  Location: [facility];  Service: Cardiovascular;  Laterality: N/A;   BREAST LUMPECTOMY WITH RADIOACTIVE SEED AND SENTINEL LYMPH NODE BIOPSY Left 02/16/2021   Procedure: LEFT BREAST LUMPECTOMY WITH RADIOACTIVE SEED X2 AND SENTINEL LYMPH NODE BIOPSY;  Surgeon: Harriette Bouillon, MD;  Location: [facility];  Service: General;  Laterality: Left;   BUNIONECTOMY  10/1976   CHOLECYSTECTOMY N/A 03/12/2019   Procedure: XI ROBOTIC ASSISTED CHOLECYSTECTOMY;  Surgeon: Berna Bue, MD;  Location: [facility];  Service: General;  Laterality: N/A;   COLONOSCOPY     COLONOSCOPY WITH PROPOFOL N/A 01/17/2020   Procedure: COLONOSCOPY WITH PROPOFOL;  Surgeon: Lynann Bologna, MD;  Location: [facility];  Service: Endoscopy;  Laterality: N/A;   DRUG INDUCED ENDOSCOPY N/A 03/04/2020   Procedure: DRUG INDUCED ENDOSCOPY;  Surgeon: Christia Reading, MD;  Location: [facility];  Service: ENT;  Laterality: N/A;   FRACTURE SURGERY  09/1964   floor of orbit right side   HIATAL HERNIA REPAIR     IMPLANTATION OF HYPOGLOSSAL NERVE STIMULATOR Right 04/15/2020   Procedure: IMPLANTATION OF HYPOGLOSSAL NERVE STIMULATOR;  Surgeon: Christia Reading, MD;  Location: [facility];  Service: ENT;  Laterality: Right;   JOINT REPLACEMENT     bilateral hip replacements   MELANOMA EXCISION  12/2011   POLYPECTOMY  01/17/2020   Procedure: POLYPECTOMY;  Surgeon: Lynann Bologna, MD;  Location: [facility];  Service:  Endoscopy;;   TOTAL HIP ARTHROPLASTY Left 06/17/2014   Procedure: LEFT TOTAL HIP ARTHROPLASTY ANTERIOR APPROACH;  Surgeon: Kathryne Hitch, MD;  Location: [facility];  Service: Orthopedics;  Laterality: Left;   TOTAL HIP ARTHROPLASTY Right 09/02/2014   Procedure: RIGHT TOTAL HIP ARTHROPLASTY ANTERIOR APPROACH;  Surgeon: Kathryne Hitch, MD;  Location: [facility];  Service: Orthopedics;  Laterality: Right;   VEIN SURGERY  05/2011   venous ablation      Current Outpatient Medications on File Prior to Visit  Medication Sig Dispense Refill   acetaminophen (TYLENOL) 500 MG tablet Take 2 tablets (1,000 mg total) by mouth every 6 (six) hours as needed.  0   Cranberry 500 MG TABS Take 1,000 mg by mouth 2 (two) times daily.     Cyanocobalamin (VITAMIN B 12) 500 MCG TABS Take 1 tablet by mouth daily.     cyclobenzaprine (FLEXERIL) 5 MG tablet Take 1 tablet (5 mg total) by mouth at bedtime as needed for muscle spasms. 30 tablet 1   diltiazem (TIAZAC) 240 MG 24 hr capsule TAKE 1 CAPSULE BY MOUTH EVERY DAY 90 capsule 3   ELIQUIS 5 MG TABS tablet TAKE 1 TABLET(5 MG) BY MOUTH TWICE DAILY. 180 tablet 1   famotidine (PEPCID) 20 MG tablet Take 20 mg by mouth 2 (two) times daily.     ibuprofen (ADVIL) 200 MG tablet Take 400 mg by mouth every 6 (six) hours as needed (pain).     letrozole (FEMARA) 2.5 MG tablet Take 1 tablet (2.5 mg total) by mouth daily. 90 tablet 3   meloxicam (MOBIC) 7.5 MG tablet TAKE 1 TABLET BY MOUTH DAILY FOR 10 DAYS. ADDITIONAL TABLETS ONLY IF NEEDED 30 tablet 0   OVER THE COUNTER MEDICATION Take 20 mg by mouth daily as needed (arthritis). CBD oil     polyethylene glycol (MIRALAX / GLYCOLAX) 17 g packet Take 17 g by mouth as needed (as directed).     sennosides-docusate sodium (SENOKOT-S) 8.6-50 MG tablet Take 1 tablet by mouth as needed for constipation.     spironolactone (ALDACTONE) 50 MG tablet TAKE 2 TABLETS BY MOUTH EVERY MORNING AND 1 TABLET EVERY EVENING 270 tablet 3    triamcinolone cream (KENALOG) 0.1 % Apply 1 application topically daily as needed (for dry skin).   1   Vibegron (GEMTESA) 75 MG TABS Take 75 mg by mouth daily.     vitamin C (ASCORBIC ACID) 500 MG tablet Take 500 mg by mouth 2 (two) times daily.     No current facility-administered medications on file prior to visit.    Allergies  Allergen Reactions   Hydrolyzed Silk Hives and Other (See Comments)    Silk tape-blisters  Ciprofloxacin Other (See Comments)    Reactions with antiarrythmic Reactions with antiarrythmic   Gabapentin    Gluten Meal Other (See Comments)    reflux   Metoprolol     dizzy     DIAGNOSTIC DATA (LABS, IMAGING, TESTING) - I reviewed patient records, labs, notes, testing and imaging myself where available.  Lab Results  Component Value Date   WBC 4.6 01/03/2023   HGB 14.5 01/03/2023   HCT 43.0 01/03/2023   MCV 104.8 (H) 01/03/2023   PLT 226.0 01/03/2023      Component Value Date/Time   NA 133 (L) 01/03/2023 1107   NA 137 11/04/2022 0843   K 4.8 01/03/2023 1107   CL 98 01/03/2023 1107   CO2 27 01/03/2023 1107   GLUCOSE 92 01/03/2023 1107   BUN 19 01/03/2023 1107   BUN 16 11/04/2022 0843   CREATININE 0.79 01/03/2023 1107   CREATININE 0.88 06/22/2016 0930   CALCIUM 9.7 01/03/2023 1107   PROT 6.9 01/03/2023 1107   ALBUMIN 4.6 01/03/2023 1107   AST 22 01/03/2023 1107   ALT 26 01/03/2023 1107   ALKPHOS 78 01/03/2023 1107   BILITOT 0.7 01/03/2023 1107   GFRNONAA >60 02/10/2021 0900   GFRAA 89 08/07/2019 0825   Lab Results  Component Value Date   CHOL 148 01/03/2023   HDL 39.20 01/03/2023   LDLCALC 87 01/03/2023   TRIG 108.0 01/03/2023   CHOLHDL 4 01/03/2023   Lab Results  Component Value Date   HGBA1C 5.5 11/27/2020   Lab Results  Component Value Date   VITAMINB12 1,247 (H) 01/03/2023   Lab Results  Component Value Date   TSH 2.32 01/03/2023    PHYSICAL EXAM:  Today's Vitals   05/17/23 0827  BP: 134/80  Pulse: 80  Weight:  239 lb (108.4 kg)  Height: 5\' 9"  (1.753 m)   Body mass index is 35.29 kg/m.   Wt Readings from Last 3 Encounters:  05/17/23 239 lb (108.4 kg)  03/20/23 231 lb 1.6 oz (104.8 kg)  02/27/23 229 lb (103.9 kg)     Ht Readings from Last 3 Encounters:  05/17/23 5\' 9"  (1.753 m)  03/20/23 5\' 9"  (1.753 m)  02/27/23 5\' 9"  (1.753 m)      General: The patient is awake, alert and appears not in acute distress. The patient is well groomed. Head: Normocephalic, atraumatic. Neck is supple. Mallampati 2,  neck circumference:15.5 inches . Nasal airflow is patent.  Retrognathia is not seen.  Dental status: biological  Cardiovascular:  Regular rate and cardiac rhythm by pulse,  without distended neck veins. Respiratory: Lungs are clear to auscultation.  Skin:  Without evidence of ankle edema, or rash. Trunk: The patient's posture is erect.   NEUROLOGIC EXAM: The patient is awake and alert, oriented to place and time.   Memory subjective described as intact.  Attention span & concentration ability appears normal.  Speech is fluent,  without  dysarthria, dysphonia or aphasia.  Mood and affect are appropriate.   Cranial nerves: no loss of smell or taste reported    Hearing was intact to soft voice and finger rubbing.    Facial sensation intact to fine touch.  Facial motor strength is symmetric and tongue and uvula move midline.   Tongue protrusion achieved at a setting of  2.1 V-  Neck ROM : rotation, tilt and flexion extension were normal for age and shoulder shrug was symmetrical.   ASSESSMENT AND PLAN:   73 y.o. year old female  here with:  HYPOGLOSSAL NERVE STIMULATOR in the treatment of OSA, this patient was not using oxygen at any time.    Severe OSA at baseline, CPAP was  followed by Dr Carolanne Grumbling, MD cardiology.  Baseline 50/h.  The patient referred her care to Dr Jenne Pane for inspire- and got implanted in 04-15-2020.  Post implant titration in June 2022, AHI 16.8/h.    1) good  tongue protrusion at 2.1 V , stronger response on 2.2 V   2) she would prefer a HST on Inspire to evaluate for AHI after weight gain.  Change in weight is about 10% .     I plan to follow up either personally or through our NP within 5-6  months.   I would like to thank Philip Aspen, Limmie Patricia, MD and Philip Aspen, Limmie Patricia, Md [address] [place],  [place] [address] for allowing me to meet with and to take care of this pleasant patient.    After spending a total time of  25  minutes face to face and additional time for physical and neurologic examination, review of laboratory studies,  personal review of imaging studies, reports and results of other testing and review of referral information / records as far as provided in visit,   Electronically signed by: Melvyn Novas, MD 05/17/2023 8:45 AM  [facility] and [facility] Board certified by The [employer] of Sleep Medicine and Diplomate of the [employer] of Sleep Medicine. Board certified In Neurology through the ABPN, Fellow of the [employer] of Neurology.

## 2023-05-17 NOTE — Telephone Encounter (Signed)
 Patient was seen today for an inspire follow up.

## 2023-05-18 ENCOUNTER — Telehealth: Payer: Self-pay | Admitting: Neurology

## 2023-05-18 NOTE — Telephone Encounter (Signed)
 HST- HTA pending

## 2023-05-29 NOTE — Telephone Encounter (Signed)
 Called to check the status [name] informed me it is still pending.

## 2023-06-05 NOTE — Telephone Encounter (Signed)
 HST HTA Berkley Harvey: [id number] (exp. 05/18/23 to 08/16/23) *pt needs to wear her [facility]

## 2023-06-07 ENCOUNTER — Other Ambulatory Visit: Payer: Self-pay | Admitting: Internal Medicine

## 2023-06-07 DIAGNOSIS — M5442 Lumbago with sciatica, left side: Secondary | ICD-10-CM

## 2023-07-04 ENCOUNTER — Ambulatory Visit (INDEPENDENT_AMBULATORY_CARE_PROVIDER_SITE_OTHER): Admitting: Neurology

## 2023-07-04 DIAGNOSIS — Z9682 Presence of neurostimulator: Secondary | ICD-10-CM

## 2023-07-04 DIAGNOSIS — G4733 Obstructive sleep apnea (adult) (pediatric): Secondary | ICD-10-CM | POA: Diagnosis not present

## 2023-07-04 DIAGNOSIS — F5104 Psychophysiologic insomnia: Secondary | ICD-10-CM

## 2023-07-04 DIAGNOSIS — Z789 Other specified health status: Secondary | ICD-10-CM

## 2023-07-04 DIAGNOSIS — R635 Abnormal weight gain: Secondary | ICD-10-CM

## 2023-07-05 NOTE — Progress Notes (Signed)
 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 [name] is her electrophysiologist, who recommended a sleep study to make sure that no apnea is present in order to preserve the effect of an ablation procedure in 2020/ 2021; preventing reoccurrence of atrial fibrillation.   Had a PSG sleep study at [facility], and was diagnosed with the following. SEVERE OSA, No REM sleep- and NREM AHI and nadir at 84% , AHI was 49.8/h, and supine over 70/h. Split night protocol was invoked - Titrated to CPAP at 16 cm water with a nasal interface which caused pressure marks later on and was replaced by a FFM, which created Air leaks . The air pressure was increased to 20 cm water, and she could not tolerate this.  She was forced to sleep supine. She was scheduled for a BiPAP test, but this took not place- she preferred to pursue the [place].  Inspire follow-up visit on 05/17/2023.   Chief concern according to patient : " I have recently slept much better, sciatica is much improved, balance improved with PT, and I have slept at times 4 uninterrupted hours . Sometimes I have not been able to fall asleep, can I take Melatonin? " (Yes).   Meloxicam  and Flexaril have been used for back pain- and she has not slept better with it. "I sleep with an open mouth, wake up dry" - and may need a mouth tape.  We discussed biotene oral rinse and Xylol gum or lozenges.  Her last evaluation for residual AHI on Inspire was in 2022.  Meanwhile, weight has gone up to 235 pounds- BMI back above 35.  She was in pain and moved less, gained  weight , slept poorly but continued to use inspire therapy compliantly, see attached reports.   HST performed with active Inspire therapy at 2.1 V:      Epworth sleepiness score: 2/24.   BMI: 35.3  kg/m   Neck Circumference: NA   FINDINGS:   Sleep Summary:   Total Recording Time (hours, min): 7 hours 57 minutes      Total Sleep Time (hours, min): 6 hours 19 minutes                Percent REM (%): 16.6%   Sleep latency was 23 minutes and REM sleep latency 49 minutes long.  There were 74 minutes of wakefulness after sleep onset.                                      Respiratory Indices:   Calculated pAHI (per CMS guideline):                     26.7/h, no central events.  REM pAHI: 24/h  NREM pAHI: 27.3/h                             Positional AHI: The patient slept for most of the recorded time in supine position, 295 minutes of supine sleep were recorded with an AHI of 29/h, this was compared to 80.5 minutes of left lateral sleep with an AHI of 20/h.  Snoring: Reached a mean volume of 41 dB which is mild to moderate , snoring was present for 21% of the total sleep time.                                             Oxygen Saturation Statistics:   Oxygen Saturation (%) Mean: 91%              O2 Saturation Range (%): Between 86 and 98%                                       O2 Saturation (minutes) <89%: 14 minutes  O2 Saturation (minutes) <90 %: 37.4 minutes, 10% of total recorded sleep time       Pulse Rate Statistics:   Pulse Mean (bpm): 71 bpm                Pulse Range:   Between 56 and 113 bpm. Please note that this home sleep test device cannot give information about cardiac rhythm.              IMPRESSION:  This HST confirms the continued presence of moderate obstructive sleep apnea and moderate hypoxia during sleep while snoring has improved to a milder degree on inspire therapy. It appears that inspire therapy has not  been effective in treating the patient's underlying apnea. The expected inspire outcome is a 50% and best outcome is up to 70% reduction in AHI and reduction in snoring volume, without significant therapeutic expected effect on hypoxia or insomnia. This has been reached- baseline AHI was 50/h and now is 26.7/h   RECOMMENDATION: The patient subjectively feels that she is sleeping better by using inspire therapy.   There may not be a way to further optimize Inspire therapy , but we have 2 options:  We can offer the patient to explore a higher Voltage setting at home or to return for in lab INSPIRE titration, with the option to add oxygen if needed.   I will provide a sleep aid should the patient decide to return to the sleep laboratory.    INTERPRETING PHYSICIAN:   [name], MD  [facility] and [facility] Board certified by The [employer] of Sleep Medicine and Diplomate of the [employer] of Sleep Medicine. Board certified In Neurology through the ABPN, Fellow of the [employer] of Neu[employer].

## 2023-07-11 ENCOUNTER — Other Ambulatory Visit: Payer: Self-pay | Admitting: Internal Medicine

## 2023-07-11 DIAGNOSIS — M5442 Lumbago with sciatica, left side: Secondary | ICD-10-CM

## 2023-07-14 ENCOUNTER — Encounter: Payer: Self-pay | Admitting: Neurology

## 2023-07-14 DIAGNOSIS — R635 Abnormal weight gain: Secondary | ICD-10-CM | POA: Insufficient documentation

## 2023-07-14 DIAGNOSIS — F5104 Psychophysiologic insomnia: Secondary | ICD-10-CM | POA: Insufficient documentation

## 2023-07-14 MED ORDER — ALPRAZOLAM 0.5 MG PO TABS: 0.5000 mg | ORAL_TABLET | Freq: Every evening | ORAL | 0 refills | Status: DC | PRN

## 2023-07-14 NOTE — Procedures (Signed)
 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 [name] is her electrophysiologist, who recommended a sleep study to make sure that no apnea is present in order to preserve the effect of an ablation procedure in 2020/ 2021; preventing reoccurrence of atrial fibrillation.   Had a PSG sleep study at [facility], and was diagnosed with the following. SEVERE OSA, No REM sleep- and NREM AHI and nadir at 84% , AHI was 49.8/h, and supine over 70/h. Split night protocol was invoked - Titrated to CPAP at 16 cm water with a nasal interface which caused pressure marks later on and was replaced by a FFM, which created Air leaks . The air pressure was increased to 20 cm water, and she could not tolerate this.  She was forced to sleep supine. She was scheduled for a BiPAP test, but this took not place- she preferred to pursue the [place].  Inspire follow-up visit on 05/17/2023.   Chief concern according to patient : " I have recently slept much better, sciatica is much improved, balance improved with PT, and I have slept at times 4 uninterrupted hours . Sometimes I have not been able to fall asleep, can I take Melatonin? " (Yes).   Meloxicam  and Flexaril have been used for back pain- and she has not slept better with it. "I sleep with an open mouth, wake up dry" - and may need a mouth tape.  We discussed biotene oral rinse and Xylol gum or lozenges.  Her last evaluation for residual AHI on Inspire was in 2022.  Meanwhile, weight has gone up to 235 pounds- BMI back above 35.  She was in pain and moved less, gained weight  , slept poorly but continued to use inspire therapy compliantly, see attached reports.   HST performed with active Inspire therapy at 2.1 V:      Epworth sleepiness score: 2/24.   BMI: 35.3  kg/m   Neck Circumference: NA   FINDINGS:   Sleep Summary:   Total Recording Time (hours, min): 7 hours 57 minutes      Total Sleep Time (hours, min): 6 hours 19 minutes                Percent REM (%): 16.6%   Sleep latency was 23 minutes and REM sleep latency 49 minutes long.  There were 74 minutes of wakefulness after sleep onset.                                      Respiratory Indices:   Calculated pAHI (per CMS guideline):                     26.7/h, no central events.  REM pAHI: 24/h                                              NREM pAHI:  27.3/h                             Positional AHI: The patient slept for most of the recorded time in supine position, 295 minutes of supine sleep were recorded with an AHI of 29/h, this was compared to 80.5 minutes of left lateral sleep with an AHI of 20/h.  Snoring: Reached a mean volume of 41 dB which is mild to moderate , snoring was present for 21% of the total sleep time.                                             Oxygen Saturation Statistics:   Oxygen Saturation (%) Mean: 91%              O2 Saturation Range (%): Between 86 and 98%                                       O2 Saturation (minutes) <89%: 14 minutes  O2 Saturation (minutes) <90 %: 37.4 minutes, 10% of total recorded sleep time       Pulse Rate Statistics:   Pulse Mean (bpm): 71 bpm                Pulse Range:   Between 56 and 113 bpm. Please note that this home sleep test device cannot give information about cardiac rhythm.              IMPRESSION:  This HST confirms the continued presence of moderate obstructive sleep apnea and moderate hypoxia during sleep while snoring has improved to a milder degree on inspire therapy. While it appears that inspire therapy has not  been able to alleviate the patient's underlying severe obstructive apnea, the expected inspire outcome is a 50% and best outcome is up to 75% reduction in AHI and in snoring volume, without expectation of  significantly therapeutic effect on hypoxia or insomnia.  Inspire has reduced the AHI - baseline AHI was 50/h and now is 26.7/h, and the patient reports milder snoring.    RECOMMENDATION: The patient subjectively feels that she is sleeping better since and when using inspire therapy.   There may not be a way to further optimize Inspire therapy , but we have 2 options:  We can offer the patient to explore a higher Voltage setting at home and retest by HST after that or to return for in-lab INSPIRE titration, with the option to add oxygen if needed.   I prefer to provide a sleep aid should the patient decide to return to the sleep laboratory.    INTERPRETING PHYSICIAN:   [name], MD  [facility] and [facility] Sleep Board certified by The [employer] of Sleep Medicine and Diplomate of the [employer] of Sleep Medicine. Board certified In Neurology through the ABPN, Fellow of the [employer] of Neu[employer].

## 2023-08-01 DIAGNOSIS — L578 Other skin changes due to chronic exposure to nonionizing radiation: Secondary | ICD-10-CM | POA: Diagnosis not present

## 2023-08-01 DIAGNOSIS — L814 Other melanin hyperpigmentation: Secondary | ICD-10-CM | POA: Diagnosis not present

## 2023-08-01 DIAGNOSIS — I872 Venous insufficiency (chronic) (peripheral): Secondary | ICD-10-CM | POA: Diagnosis not present

## 2023-08-01 DIAGNOSIS — L821 Other seborrheic keratosis: Secondary | ICD-10-CM | POA: Diagnosis not present

## 2023-08-01 DIAGNOSIS — D225 Melanocytic nevi of trunk: Secondary | ICD-10-CM | POA: Diagnosis not present

## 2023-08-01 DIAGNOSIS — L720 Epidermal cyst: Secondary | ICD-10-CM | POA: Diagnosis not present

## 2023-08-01 DIAGNOSIS — Z85820 Personal history of malignant melanoma of skin: Secondary | ICD-10-CM | POA: Diagnosis not present

## 2023-08-02 NOTE — Telephone Encounter (Signed)
 Pt has called asking to speak with Meagan, sleep manager

## 2023-08-03 ENCOUNTER — Encounter: Payer: Self-pay | Admitting: Neurology

## 2023-08-08 ENCOUNTER — Ambulatory Visit
Admission: RE | Admit: 2023-08-08 | Discharge: 2023-08-08 | Disposition: A | Discharge status: Home / Self Care | Payer: PPO | Source: Ambulatory Visit | Attending: Internal Medicine | Admitting: Internal Medicine

## 2023-08-08 ENCOUNTER — Ambulatory Visit: Payer: Self-pay | Admitting: Internal Medicine

## 2023-08-08 DIAGNOSIS — N958 Other specified menopausal and perimenopausal disorders: Secondary | ICD-10-CM | POA: Diagnosis not present

## 2023-08-08 DIAGNOSIS — E559 Vitamin D deficiency, unspecified: Secondary | ICD-10-CM

## 2023-08-08 DIAGNOSIS — Z13820 Encounter for screening for osteoporosis: Secondary | ICD-10-CM

## 2023-08-10 ENCOUNTER — Other Ambulatory Visit: Payer: Self-pay | Admitting: Internal Medicine

## 2023-08-10 DIAGNOSIS — M5442 Lumbago with sciatica, left side: Secondary | ICD-10-CM

## 2023-08-23 ENCOUNTER — Telehealth: Payer: Self-pay | Admitting: Neurology

## 2023-08-23 ENCOUNTER — Ambulatory Visit: Admitting: Neurology

## 2023-08-23 ENCOUNTER — Encounter: Payer: Self-pay | Admitting: Neurology

## 2023-08-23 VITALS — BP 140/65 | HR 74 | Ht 68.0 in | Wt 228.0 lb

## 2023-08-23 DIAGNOSIS — Z9682 Presence of neurostimulator: Secondary | ICD-10-CM | POA: Diagnosis not present

## 2023-08-23 DIAGNOSIS — G4733 Obstructive sleep apnea (adult) (pediatric): Secondary | ICD-10-CM

## 2023-08-23 DIAGNOSIS — Z789 Other specified health status: Secondary | ICD-10-CM | POA: Diagnosis not present

## 2023-08-23 DIAGNOSIS — F5104 Psychophysiologic insomnia: Secondary | ICD-10-CM

## 2023-08-23 MED ORDER — BUSPIRONE HCL 5 MG PO TABS: 5.0000 mg | ORAL_TABLET | Freq: Every evening | ORAL | 2 refills | Status: DC | PRN

## 2023-08-23 NOTE — Patient Instructions (Signed)
 73 y.o. year old female OSA patient here with:   Atrial fib, OSA on INSPIRE. Unexplained  highly fragmented sleep .     INSPIRE now  at 2.2V/  level 5 -  Outreach to the patient to allow her further increase in  titration - 3 more levels are open to her . Aaron Aas      1) I added Buspar at night PO- Prn use  to see f we can reduce the sleep fragmentations.    2) I ordered a new in lab study for titration of INSPIRE - if there is any suboptimal AHI control. Xanax  to be taken in lab- 1.9- 2.5V  maximum setting.    3) BMI close to 35, weight loss still recommended.    INTERPRETING PHYSICIAN:    [name], MD  [facility] and [facility] Sleep Board certified by The [employer] of Sleep Medicine and Diplomate of the [employer] of Sleep Medicine.

## 2023-08-23 NOTE — Telephone Encounter (Signed)
 Park Bolk the inspire reps were here for today visit.

## 2023-08-23 NOTE — Progress Notes (Addendum)
 Provider:  [name], MD  Primary Care Physician:  Zilphia Hilt, Charyl Coppersmith, MD [address] [place] [address]     Referring Provider: Zilphia Hilt, Charyl Coppersmith, Md [address] [place],  [place] [address]          Chief Complaint according to patient   Patient presents with:                HISTORY OF PRESENT ILLNESS:  [name] is a 73 y.o. female patient who is here for revisit 08/23/2023 for  sleep fragmentation.  Inspire patient with history of atrial fibrillation. Nocturia  .    She had a HST in April- there was a 2 hour pause - in te PSG she had slept better. 2.2 V were comfortable. We can also repeat in lab study and try a change in electrode stimulation setting -  She has xanax  for in lab test.   For her current user data: she is very compliant but her night is broken up, many, many pauses for unknown reasons.   Epworth 3 / 24 FSS  15/ 63    Start  once at night po Buspar;  po.  She is not on any SSRI.     Patient was seen today for an inspire follow up.   She is scheduled to have a NPSG on 10/16/2023 at 8 pm.                Review of Systems: Out of a complete 14 system review, the patient complains of only the following symptoms, and all other reviewed systems are negative.:     Social History   Socioeconomic History   Marital status: Divorced    Spouse name: Not on file   Number of children: 2   Years of education: JD   Highest education level: Professional school degree (e.g., MD, DDS, DVM, JD)  Occupational History   Occupation: lawyer/RETIRED  Tobacco Use   Smoking status: Never   Smokeless tobacco: Never  Vaping Use   Vaping status: Never Used  Substance and Sexual Activity   Alcohol  use: Not Currently    Comment: rarely at [facility]    Drug use: No   Sexual activity: Not Currently    Birth control/protection: Surgical  Other Topics Concern   Not on file  Social History Narrative    Work or School: retired Pensions consultant      Home Situation: lives alone - takes care of twin grandchildren 7 months in 05/2015      Spiritual Beliefs:       Lifestyle: active, healthy diet      Pt lives alone    Social Drivers of Health   Financial Resource Strain: Low Risk  (12/29/2022)   Overall Financial Resource Strain (CARDIA)    Difficulty of Paying Living Expenses: Not very hard  Food Insecurity: No Food Insecurity (12/29/2022)   Hunger Vital Sign    Worried About Running Out of Food in the Last Year: Never true    Ran Out of Food in the Last Year: Never true  Transportation Needs: No Transportation Needs (12/29/2022)   PRAPARE - Administrator, Civil Service (Medical): No    Lack of Transportation (Non-Medical): No  Physical Activity: Sufficiently Active (12/29/2022)   Exercise Vital Sign    Days of Exercise per Week: 7 days    Minutes of Exercise per Session: 30 min  Stress: No Stress Concern Present (12/29/2022)   [employer] of Occupational Health - Occupational Stress Questionnaire    Feeling of Stress : Only a little  Social Connections: Moderately Integrated (12/29/2022)   Social Connection and Isolation Panel [NHANES]    Frequency of Communication with Friends and Family: More than three times a week    Frequency of Social Gatherings with Friends and Family: More than three times a week    Attends Religious Services: More than 4 times per year    Active Member of Clubs or Organizations: Yes    Attends Engineer, structural: More than 4 times per year    Marital Status: Divorced    Family History  Problem Relation Age of Onset   Uterine cancer Mother    Emphysema Mother    Hypertension Father 60   Hyperlipidemia Father    Epilepsy Father 71   Colon cancer Father 48   Leukemia Father    Arthritis Sister 61   Diabetes Brother 38   Cancer Paternal Uncle        unk type   Cancer Maternal Grandmother        unk type possibly breast or  skin   Other Paternal Grandmother        influenza    Stroke Paternal Grandfather    Skin cancer Daughter        SCC on back   Esophageal cancer Neg Hx    Rectal cancer Neg Hx    Stomach cancer Neg Hx    Sleep apnea Neg Hx     Past Medical History:  Diagnosis Date   Chronic edema    Clotting disorder (HCC)    Constipation    Difficult intubation    pt states that her neck needs to be in a neutral position,  scoliosisand herniated dics in neck   Family history of colon cancer    Family history of uterine cancer    Gallstones    GERD (gastroesophageal reflux disease)    protonix , hx h. pylori   Herniated disc, cervical    History of hiatal hernia    History of sebaceous adenoma    History of uterine cancer 04/22/2014   sees Dr. 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 Dorisann Garre in dermatology right upper arm   Obesity    OSA on CPAP    uses CPAP   Paroxysmal atrial fibrillation (HCC)    Peripheral vascular disease (HCC)    Personal history of radiation therapy    PONV (postoperative nausea and vomiting)    Recurrent UTI    S/P hip replacement    Scoliosis    Sleep apnea    Thyroid  nodule    Urinary incontinence    Uterine cancer (HCC)    Stage 1   Venous insufficiency    s/p ablation    Past Surgical History:  Procedure Laterality Date   ABDOMINAL HYSTERECTOMY  09/10/2013   ATRIAL FIBRILLATION ABLATION N/A 08/09/2019   Procedure: ATRIAL FIBRILLATION ABLATION;  Surgeon: Lei Pump, MD;  Location: [facility];  Service: Cardiovascular;  Laterality: N/A;   BREAST LUMPECTOMY WITH RADIOACTIVE SEED AND SENTINEL LYMPH NODE BIOPSY Left 02/16/2021   Procedure: LEFT BREAST LUMPECTOMY WITH RADIOACTIVE SEED X2 AND SENTINEL LYMPH NODE BIOPSY;  Surgeon: Sim Dryer, MD;  Location: [facility];  Service: General;  Laterality: Left;   BUNIONECTOMY   10/1976   CHOLECYSTECTOMY N/A 03/12/2019   Procedure: XI ROBOTIC ASSISTED CHOLECYSTECTOMY;  Surgeon: Adalberto Acton, MD;  Location: [facility];  Service: General;  Laterality: N/A;   COLONOSCOPY     COLONOSCOPY WITH PROPOFOL  N/A 01/17/2020   Procedure: COLONOSCOPY WITH PROPOFOL ;  Surgeon: Lajuan Pila, MD;  Location: [facility];  Service: Endoscopy;  Laterality: N/A;   DRUG INDUCED ENDOSCOPY N/A 03/04/2020   Procedure: DRUG INDUCED ENDOSCOPY;  Surgeon: Virgina Grills, MD;  Location: [facility];  Service: ENT;  Laterality: N/A;   FRACTURE SURGERY  09/1964   floor of orbit right side   HIATAL HERNIA REPAIR     IMPLANTATION OF HYPOGLOSSAL NERVE STIMULATOR Right 04/15/2020   Procedure: IMPLANTATION OF HYPOGLOSSAL NERVE STIMULATOR;  Surgeon: Virgina Grills, MD;  Location: [facility];  Service: ENT;  Laterality: Right;   JOINT REPLACEMENT     bilateral hip replacements   MELANOMA EXCISION  12/2011   POLYPECTOMY  01/17/2020   Procedure: POLYPECTOMY;  Surgeon: Lajuan Pila, MD;  Location: [facility];  Service: Endoscopy;;   TOTAL HIP ARTHROPLASTY Left 06/17/2014   Procedure: LEFT TOTAL HIP ARTHROPLASTY ANTERIOR APPROACH;  Surgeon: Arnie Lao, MD;  Location: [facility];  Service: Orthopedics;  Laterality: Left;   TOTAL HIP ARTHROPLASTY Right 09/02/2014   Procedure: RIGHT TOTAL HIP ARTHROPLASTY ANTERIOR APPROACH;  Surgeon: Arnie Lao, MD;  Location: [facility];  Service: Orthopedics;  Laterality: Right;   VEIN SURGERY  05/2011   venous ablation      Current Outpatient Medications on File Prior to Visit  Medication Sig Dispense Refill   acetaminophen  (TYLENOL ) 500 MG tablet Take 2 tablets (1,000 mg total) by mouth every 6 (six) hours as needed.  0   ALPRAZolam  (XANAX ) 0.5 MG tablet Take 1 tablet (0.5 mg total) by mouth at bedtime as needed for sleep (for use in the sleep lab during testing.). 3 tablet 0   Cranberry 500 MG TABS Take 1,000 mg by mouth 2 (two)  times daily.     Cyanocobalamin  (VITAMIN B 12) 500 MCG TABS Take 1 tablet by mouth daily.     cyclobenzaprine  (FLEXERIL ) 5 MG tablet Take 1 tablet (5 mg total) by mouth at bedtime as needed for muscle spasms. 30 tablet 1   diltiazem  (TIAZAC ) 240 MG 24 hr capsule TAKE 1 CAPSULE BY MOUTH EVERY DAY 90 capsule 3   ELIQUIS  5 MG TABS tablet TAKE 1 TABLET(5 MG) BY MOUTH TWICE DAILY. 180 tablet 1   famotidine (PEPCID) 20 MG tablet Take 20 mg by mouth 2 (two) times daily.     ibuprofen  (ADVIL ) 200 MG tablet Take 400 mg by mouth every 6 (six) hours as needed (pain).     letrozole  (FEMARA ) 2.5 MG tablet Take 1 tablet (2.5 mg total) by mouth daily. 90 tablet 3   meloxicam  (MOBIC ) 7.5 MG tablet TAKE 1 TABLET BY MOUTH DAILY FOR 10 DAYS. ADDITIONAL TABLETS ONLY IF NEEDED 30 tablet 0   OVER THE COUNTER MEDICATION Take 20 mg by mouth daily as needed (arthritis). CBD oil     polyethylene glycol (MIRALAX  / GLYCOLAX ) 17 g packet Take 17 g by mouth as needed (as directed).     sennosides-docusate sodium  (SENOKOT-S) 8.6-50 MG tablet Take 1 tablet by mouth as needed for constipation.     spironolactone  (ALDACTONE ) 50 MG tablet TAKE 2 TABLETS BY MOUTH EVERY MORNING AND 1 TABLET EVERY EVENING 270 tablet 3  triamcinolone cream (KENALOG) 0.1 % Apply 1 application topically daily as needed (for dry skin).   1   Vibegron (GEMTESA) 75 MG TABS Take 75 mg by mouth daily.     vitamin C (ASCORBIC ACID) 500 MG tablet Take 500 mg by mouth 2 (two) times daily.     No current facility-administered medications on file prior to visit.    Allergies  Allergen Reactions   Hydrolyzed Silk Hives and Other (See Comments)    Silk tape-blisters   Ciprofloxacin Other (See Comments)    Reactions with antiarrythmic Reactions with antiarrythmic   Gabapentin     Gluten Meal Other (See Comments)    reflux   Metoprolol      dizzy     DIAGNOSTIC DATA (LABS, IMAGING, TESTING) - I reviewed patient records, labs, notes, testing and imaging  myself where available.  Lab Results  Component Value Date   WBC 4.6 01/03/2023   HGB 14.5 01/03/2023   HCT 43.0 01/03/2023   MCV 104.8 (H) 01/03/2023   PLT 226.0 01/03/2023      Component Value Date/Time   NA 133 (L) 01/03/2023 1107   NA 137 11/04/2022 0843   K 4.8 01/03/2023 1107   CL 98 01/03/2023 1107   CO2 27 01/03/2023 1107   GLUCOSE 92 01/03/2023 1107   BUN 19 01/03/2023 1107   BUN 16 11/04/2022 0843   CREATININE 0.79 01/03/2023 1107   CREATININE 0.88 06/22/2016 0930   CALCIUM  9.7 01/03/2023 1107   PROT 6.9 01/03/2023 1107   ALBUMIN  4.6 01/03/2023 1107   AST 22 01/03/2023 1107   ALT 26 01/03/2023 1107   ALKPHOS 78 01/03/2023 1107   BILITOT 0.7 01/03/2023 1107   GFRNONAA >60 02/10/2021 0900   GFRAA 89 08/07/2019 0825   Lab Results  Component Value Date   CHOL 148 01/03/2023   HDL 39.20 01/03/2023   LDLCALC 87 01/03/2023   TRIG 108.0 01/03/2023   CHOLHDL 4 01/03/2023   Lab Results  Component Value Date   HGBA1C 5.5 11/27/2020   Lab Results  Component Value Date   VITAMINB12 1,247 (H) 01/03/2023   Lab Results  Component Value Date   TSH 2.32 01/03/2023    PHYSICAL EXAM:  Today's Vitals   08/23/23 0941  BP: (!) 140/65  Pulse: 74  Weight: 228 lb (103.4 kg)  Height: 5' 8 (1.727 m)   Body mass index is 34.67 kg/m.   Wt Readings from Last 3 Encounters:  08/23/23 228 lb (103.4 kg)  05/17/23 239 lb (108.4 kg)  03/20/23 231 lb 1.6 oz (104.8 kg)     Ht Readings from Last 3 Encounters:  08/23/23 5' 8 (1.727 m)  05/17/23 5' 9 (1.753 m)  03/20/23 5' 9 (1.753 m)      General: The patient is awake, alert and appears not in acute distress. The patient is well groomed. Head: Normocephalic, atraumatic.   NEUROLOGIC EXAM: The patient is awake and alert, oriented to place and time.   Memory subjective described as intact.  Attention span & concentration ability appears normal.  Speech is fluent,  without  dysarthria, dysphonia or aphasia.  Mood  and affect are appropriate.   Cranial nerves: no loss of smell or taste reported  Pupils are equal and briskly reactive to light.   Extraocular movements in vertical and horizontal planes were intact and without nystagmus. No Diplopia.  Facial motor strength is symmetric and tongue and uvula move midline.  Neck ROM : rotation, tilt and flexion extension  were normal for age and shoulder shrug was symmetrical.    Motor exam:  Symmetric bulk, tone and ROM.   Normal tone without cog wheeling, symmetric grip strength .      ASSESSMENT AND PLAN 73 y.o. year old female  here with:  Atrial fib, OSA on INSPIRE. Unexplained  highly fragmented sleep .   INSPIRE now  at 2.2V/  level 5 -  Outreach to the patient to allow her further increase in  titration - 3 more levels are open to her . Aaron Aas     1) I added Buspar at night PO- Prn use  to see f we can reduce the sleep fragmentations.   2) I ordered a new in lab study for titration of INSPIRE - if there is any suboptimal AHI control. Xanax  to be taken in lab- 1.9- 2.5V  maximum setting.   3) BMI close to 35, weight loss still recommended.   INTERPRETING PHYSICIAN:   [name], MD  [facility] and [facility] Sleep Board certified by The [employer] of Sleep Medicine and Diplomate of the [employer] of Sleep Medicine. Board certified In Neurology through the ABPN, Fellow of the [employer] of Neu[employer].       I would like to thank Zilphia Hilt, Charyl Coppersmith, Md [address] [place],  [place] [address] for allowing me to meet with and to take care of this pleasant patient.     After spending a total time of  25  minutes face to face and additional time for physical and neurologic examination, review of laboratory studies,  personal review of imaging studies, reports and results of other testing and review of referral information / records as far as provided in visit,   Electronically signed  by: [name], MD 08/23/2023 10:16 AM  [facility] Board certified by The [employer] of Sleep Medicine and Diplomate of the [employer] of Sleep Medicine. Board certified In Neurology through the ABPN, Fellow of the [employer] of Neu[employer].

## 2023-08-23 NOTE — Telephone Encounter (Signed)
 Patient was seen today for an inspire follow up.  She is scheduled to have a NPSG on 10/16/2023 at 8 pm.

## 2023-08-24 ENCOUNTER — Telehealth: Payer: Self-pay | Admitting: Neurology

## 2023-08-24 NOTE — Telephone Encounter (Signed)
Mailed packet to the patient.  

## 2023-08-24 NOTE — Telephone Encounter (Signed)
 NPSG HTA pending

## 2023-09-04 NOTE — Telephone Encounter (Signed)
 NPSG HTA shara: [id number] (exp. 08/24/23 to 11/22/23)

## 2023-09-06 NOTE — Telephone Encounter (Signed)
 Patient returned my call.  Her inspire f/u is 11/20/23. Rep Damien has been notified.

## 2023-09-06 NOTE — Telephone Encounter (Signed)
 Left voicemail for patient to call back to schedule her f/u with Dr. Chalice for her inspire appt after he SS.

## 2023-09-12 ENCOUNTER — Other Ambulatory Visit: Payer: Self-pay | Admitting: Cardiology

## 2023-09-16 ENCOUNTER — Other Ambulatory Visit: Payer: Self-pay | Admitting: Internal Medicine

## 2023-09-16 DIAGNOSIS — M5442 Lumbago with sciatica, left side: Secondary | ICD-10-CM

## 2023-10-20 DIAGNOSIS — R8271 Bacteriuria: Secondary | ICD-10-CM | POA: Diagnosis not present

## 2023-10-20 DIAGNOSIS — N3000 Acute cystitis without hematuria: Secondary | ICD-10-CM | POA: Diagnosis not present

## 2023-11-05 ENCOUNTER — Ambulatory Visit (INDEPENDENT_AMBULATORY_CARE_PROVIDER_SITE_OTHER): Admitting: Neurology

## 2023-11-05 DIAGNOSIS — Z789 Other specified health status: Secondary | ICD-10-CM

## 2023-11-05 DIAGNOSIS — G4734 Idiopathic sleep related nonobstructive alveolar hypoventilation: Secondary | ICD-10-CM

## 2023-11-05 DIAGNOSIS — F5104 Psychophysiologic insomnia: Secondary | ICD-10-CM

## 2023-11-05 DIAGNOSIS — R635 Abnormal weight gain: Secondary | ICD-10-CM

## 2023-11-05 DIAGNOSIS — G4733 Obstructive sleep apnea (adult) (pediatric): Secondary | ICD-10-CM

## 2023-11-05 DIAGNOSIS — Z9682 Presence of neurostimulator: Secondary | ICD-10-CM

## 2023-11-08 ENCOUNTER — Encounter: Payer: Self-pay | Admitting: Neurology

## 2023-11-08 ENCOUNTER — Ambulatory Visit: Payer: Self-pay | Admitting: Neurology

## 2023-11-08 DIAGNOSIS — G4733 Obstructive sleep apnea (adult) (pediatric): Secondary | ICD-10-CM

## 2023-11-08 DIAGNOSIS — F5104 Psychophysiologic insomnia: Secondary | ICD-10-CM

## 2023-11-08 DIAGNOSIS — Z9682 Presence of neurostimulator: Secondary | ICD-10-CM

## 2023-11-08 DIAGNOSIS — G4734 Idiopathic sleep related nonobstructive alveolar hypoventilation: Secondary | ICD-10-CM | POA: Insufficient documentation

## 2023-11-08 MED ORDER — ALPRAZOLAM 0.5 MG PO TABS: 0.5000 mg | ORAL_TABLET | Freq: Every evening | ORAL | 0 refills | Status: AC | PRN

## 2023-11-08 NOTE — Procedures (Signed)
 [facility] POLYSOMNOGRAPHY INSPIRE TITRATION REPORT   STUDY DATE:  11/05/2023     PATIENT NAME:  [name]         DATE OF BIRTH:  [birth year]  PATIENT ID:  [id number]    TYPE OF STUDY:  Inspire  READING PHYSICIAN: DEDRA GORES, MD REFERRED BY: PCP HISTORY: [name] is a patient using INSPIRE  hypoglossal nerve stimulator for treatment of OSA, has also Insomnia, atrial fibrillation, Nocturia. HST in April documented on Inspire an AHI by CMS criteria of 26.7/h.  SCORING TECHNICIAN: Sheena Fields, RPSGT  ADDITIONAL INFORMATION:  The Epworth Sleepiness Scale endorsed was:  3 /24 points (scores above or equal to 10 are suggestive of hypersomnolence). FSS endorsed at   /63 points.  Height: 68 in Weight: 228 lb (BMI 34) Neck Size: 0 in  MEDICATIONS: Tylenol , Xanax , Cranberry, Vitamin B12, Flexeril , Tiazac , Eliquis , Pepcid, Advil , Femara , Mobic , CBD Oil, Miralax , Senokot-S, Aldactone , Kenalog, Gemteza, Vit. C   TECHNICAL DESCRIPTION: A registered sleep technologist (RPSGT) was in attendance for the duration of the recording.  Data collection, scoring, video monitoring, and reporting were performed in compliance with the AASM Manual for the Scoring of Sleep and Associated Events;  SLEEP CONTINUITY AND SLEEP ARCHITECTURE:  The patient started at a Voltage of 2.0V, which was 0.2 Volts lower than currently used at home.  Voltage was increased to 2.3V at the highest level, no oxygen was added.  Lights-out was at 22:06: and lights-on at  05:06:, with  7.0 hours  ( 420 minutes )of recording time . Total sleep time (TST) was 285.0 minutes with a decreased sleep efficiency at 68.3%.   BODY POSITION:  TST was divided between the following sleep positions: 90.7% supine; 9.3% lateral; 0% prone. Total supine REM sleep time was 18 minutes (100% of total REM sleep).  Sleep latency was 12.5 minutes.  REM sleep latency was 268.5 minutes. Of the total sleep time, the  percentage of stage N1 sleep was 5.1%, stage N2 sleep was 88%, stage N3 sleep was 0.0%, and REM sleep was 6.5%.  There were 2 Stage R periods observed on this study night, 24 awakenings (i.e. transitions to Stage W from any sleep stage), and 68 total stage transitions. Wake after sleep onset (WASO) time accounted for 41.5 m.   RESPIRATORY MONITORING:   Based on CMS criteria (using a 4% oxygen desaturation rule for scoring hypopneas), there were 70 apneas (68 obstructive; 0 central; 2 mixed), and 95 hypopneas.   Apnea index was 14.7. Hypopnea index was 20.0.  The total  AHI (apnea-hypopnea index) was 34.7/h  -( AHI was 34.1/h in supine, 0 non-supine; 25.9/h in  supine REM).  There were 0 respiratory effort-related arousals (RERAs).   OXIMETRY: Oxyhemoglobin Saturation Nadir during sleep was at  75%  from a mean of only 89%.  Total sleep time (TST)in hypoxemia (<89%) was 134.5 minutes, or 47.2% of total sleep time.  LIMB MOVEMENTS: There were 0 periodic limb movements of sleep (0.0/hr. AROUSAL: There were 21 arousals/hour.  Of these, 83 were identified as respiratory-related arousals (17 /h), 0 were PLM-related arousals (0 /h), and 30 were non-specific arousals (6 /h). EEG:  PSG EEG was of normal amplitude and frequency, with symmetric manifestation of sleep stages. EKG: The electrocardiogram documented NSR.  The average heart rate during sleep was 74 bpm.  The heart rate during sleep varied between a minimum of N/A and  a maximum of  99 bpm. AUDIO and  VIDEO:  Snoring was not reported by the attending technician .   IMPRESSION: Severe sleep disordered breathing was present (AHI at onset was 60/h !)  and improved gradually and a partially with titration of Inspire - at the 2.3 Volt level ,the most effective level to control Obstructive Sleep apnea, the CMS score- based  AHI of 14.2/h was noted.    Severe OSA was associated with prolonged oxygen desaturation. Inspire did not influence oxygen levels and  hypoxia remains untreated.     2)  Sleep efficiency was low.  Sleep fragmentation was noted- The majority of sleep arousals was related to respiratory arousals. Sleep fragmentation was noted- Most of the prolonged wakefulness periods were related to nocturia (3 times)   RECOMMENDATIONS: Increase of Inspire Voltage to 2.3 V with partial control of OSA, allowing to reduce the severe apnea to a mild/ moderate degree.  Oxygen titration in laboratory is needed for this patient whose sever sleep hypoxia was not treated by inspire ( expectedly) . Treatment of apnea does not entail treatment of insomnia but the noted hypoxia may contribute to Insomnia and may improve with oxygen supplementation.   DEDRA GORES, MD          [facility] INSPIRE Report    General Information  Name: [name], [name] BMI: 65 Physician: GORES DEDRA  ID: [id number] Height: 68 in Technician: Harvey Brisker  Sex: Female Weight: 228 lb Record: [id number]  Age: 73 [March 19, 1950] Date: 11/05/2023 Scorer: Brisker Harvey   Recommended Settings IPAP: N/A cmH20 EPAP: N/A cmH2O AHI: N/A AHI (4%): N/A     Voltage 00 2.0 2.1 2.2 1.8 2.3   O2 Vol 0.0 0.0 0.0 0.0 0.0 0.0  Time TRT 197.36m 43.46m 40.27m 25.30m 41.17m 69.7m   TST 83.1m 43.48m 37.30m 25.85m 28.83m 67.67m  Sleep Stage % Wake 57.9 0.0 6.3 0.0 32.5 2.9   % REM 0.0 0.0 0.0 25.5 30.4 5.2   % N1 16.9 1.1 0.0 0.0 0.0 0.0   % N2 83.1 98.9 100.0 74.5 69.6 94.8   % N3 0.0 0.0 0.0 0.0 0.0 0.0  Respiratory Total Events 87 36 25 9 14 16    Obs. Apn. 39 16 3 0 0 10   Mixed Apn. 2 0 0 0 0 0   Cen. Apn. 0 0 0 0 0 0   Hypopneas 46 20 22 9 14 6    AHI 62.89 49.66 40.00 21.18 30.00 14.22   Supine AHI 72.21 49.66 40.00 21.18 30.00 14.22   Prone AHI 0.00 0.00 0.00 0.00 0.00 0.00   Side AHI 43.02 0.00 0.00 0.00 0.00 0.00  Respiratory (4%) Hypopneas (4%) 42.00 16.00 15.00 3.00 14.00 5.00   AHI (4%) 60.00 44.14 28.80 7.06 30.00 13.33   Supine  AHI (4%) 69.03 44.14 28.80 7.06 30.00 13.33   Prone AHI (4%) 0.00 0.00 0.00 0.00 0.00 0.00   Side AHI (4%) 40.75 0.00 0.00 0.00 0.00 0.00  Desat Profile <= 90% 89.29m 30.29m 32.57m 24.76m 30.34m 63.41m   <= 80% 22.95m 3.28m 2.42m 0.31m 7.72m 4.71m   <= 70% 22.76m 3.76m 2.110m 0.61m 7.67m 4.82m   <= 60% 22.82m 3.80m 2.58m 0.36m 7.11m 4.80m  Arousal Index Apnea 18.1 4.1 0.0 0.0 0.0 3.6   Hypopnea 20.2 12.4 9.6 4.7 8.6 1.8   LM 0.0 0.0 0.0 0.0 0.0 0.0   Spontaneous 12.3 2.8 6.4 0.0 8.6 2.7   [facility] INSPIRE Summary    General Information  Name: [name], [name] BMI: 65.32 Physician: DEDRA GORES, MD  ID: [id number] Height: 68.0 in Technician: Jesusa Haddock, RPSGT  Sex: Female Weight: 228.0 lb Record: [id number]  Age: 61 [Sep 06, 1950] Date: 11/05/2023     Medical & Medication History    [name] is a 72 y.o. female patient who is here for revisit 08/23/2023 for sleep fragmentation. Inspire patient with history of atrial fibrillation. Nocturia . She had a HST in April- there was a 2 hour pause - in te PSG she had slept better. 2.2 V were comfortable. We can also repeat in lab study and try a change in electrode stimulation setting - She has xanax  for in lab test. For her current user data: she is very compliant but her night is broken up, many, many pauses for unknown reasons. Calculated pAHI (per CMS guideline): 26.7/h, no central events. REM pAHI: 24/h NREM pAHI: 27.3/h Positional AHI: The patient slept for most of the recorded time in supine position, 295 minutes of supine sleep were recorded with an AHI of 29/h, this was compared to 80.5 minutes of left lateral sleep with an AHI of 20/h.  Tylenol , Xanax , Cranberry, Vitamin B12, Flexeril , Tiazac , Qliquis, Pepcid, Advil , Femara , Mobic , CBD Oil, Miralax , Senokot-S, Aldactone , Kenalog, Gemtesa, Vitamin C   Sleep Disorder      Comments   The patient came into the lab for a Inspire titration. The patient took Xanax  prior to  start of study. Per patient did not take Buspar  because, she took it multiple times and it did not help her. The patient was split due to having an AHI greater than 40 after 2 hours of total sleep time. The paitnet's incomeing amplitude was 2.2. Inspire was started at 2.0 V. Inspire was increased to 2.2 V. The patient's events seemed to get worse. Tech support recommended lowering amplitude to 1.8V. At 1.8 V events seemed to get worse and tech was running out of time to titrate pt therefore, amplitude was increased back to 2.2V. Amplitude was increased to 2.3V. The patient had the most success at this voltage. The patient had three restroom breaks. EKG showed no obvious cardiac arrhythmias. Mild to moderate snoring. All sleep stages witnessed. Respiratory events scored with a 4% desat. Slept mostly supine. No O2 was added. During the study there was technical issues. Programmer was having electrical interference and would not connect to patient's implanted while head box was connected. You will notice multiple times head box was disconnect this is the reason.     CPAP start time: 12:12:40 AM CPAP end time: 05:06:30 AM   Time Total Supine Side Prone Upright  Recording (TRT) 4h 51.5m 4h 16.29m 0h 35.26m 0h 0.49m 0h 0.65m  Sleep (TST) 4h 9.92m 3h 47.11m 0h 22.72m 0h 0.25m 0h 0.13m   Latency N1 N2 N3 REM Onset Per. Slp. Eff.  Actual 0h 5.51m 0h 0.20m 0h 0.7m 2h 35.8m 0h 0.4m 0h 0.75m 85.74%   Stg Dur Wake N1 N2 N3 REM  Total 41.5 7.0 224.0 0.0 18.5  Supine 29.0 2.5 206.0 0.0 18.5  Side 12.5 4.5 18.0 0.0 0.0  Prone 0.0 0.0 0.0 0.0 0.0  Upright 0.0 0.0 0.0 0.0 0.0   Stg % Wake N1 N2 N3 REM  Total 14.3 2.8 89.8 0.0 7.4  Supine 10.0 1.0 82.6 0.0 7.4  Side 4.3 1.8 7.2 0.0 0.0  Prone 0.0 0.0 0.0 0.0 0.0  Upright 0.0 0.0 0.0 0.0 0.0     Apnea Summary Sub Supine Side Prone  Upright  Total 35 Total 35 33 2 0 0    REM 3 3 0 0 0    NREM 32 30 2 0 0  Obs 33 REM 3 3 0 0 0    NREM 30 30 0 0 0  Mix 2 REM 0 0 0 0 0     NREM 2 0 2 0 0  Cen 0 REM 0 0 0 0 0    NREM 0 0 0 0 0   Rera Summary Sub Supine Side Prone Upright  Total 0 Total 0 0 0 0 0    REM 0 0 0 0 0    NREM 0 0 0 0 0   Hypopnea Summary Sub Supine Side Prone Upright  Total 95 Total 95 82 13 0 0    REM 7 7 0 0 0    NREM 88 75 13 0 0   4% Hypopnea Summary Sub Supine Side Prone Upright  Total (4%) 76 Total 76 63 13 0 0    REM 5 5 0 0 0    NREM 71 58 13 0 0     AHI Total Obs Mix Cen  31.26 Apnea 8.42 7.94 0.48 0.00   Hypopnea 22.85 -- -- --  26.69 Hypopnea (4%) 18.28 -- -- --    Total Supine Side Prone Upright  Position AHI 31.26 30.40 40.00 0.00 0.00  REM AHI 32.43   NREM AHI 31.17   Position RDI 31.26 30.40 40.00 0.00 0.00  REM RDI 32.43   NREM RDI 31.17    4% Hypopnea Total Supine Side Prone Upright  Position AHI (4%) 26.69 25.37 40.00 0.00 0.00  REM AHI (4%) 25.95   NREM AHI (4%) 26.75   Position RDI (4%) 26.69 25.37 40.00 0.00 0.00  REM RDI (4%) 25.95   NREM RDI (4%) 26.75    Desaturation Information  <100% <90% <80% <70% <60% <50% <40%  Supine 119 98 0 0 0 0 0  Side 29 18 0 0 0 0 0  Prone 0 0 0 0 0 0 0  Upright 0 0 0 0 0 0 0  Total 148 116 0 0 0 0 0  Desaturation threshold setting: 3% Minimum desaturation setting: 10 seconds SaO2 nadir: 75% The longest event was a 93 sec obstructive Apnea with a minimum SaO2 of 86%. The lowest SaO2 was 82% associated with a 42 sec obstructive Apnea. EKG Rates EKG Avg Max Min  Awake 74 91 57  Asleep 74 99 68  EKG Events: N/A Awakening/Arousal Information # of Awakenings 15  Wake after sleep onset 41.23m  Wake after persistent sleep 41.50m   Arousal Assoc. Arousals Index  Apneas 12 2.9  Hypopneas 40 9.6  Leg Movements 0 0.0  Snore 0.0 0.0  PTT Arousals 0 0.0  Spontaneous 25 6.0  Total 75 18.0  Myoclonus Information PLMS LMs Index  Total LMs during PLMS 0 0.0  LMs w/ Microarousals 0 0.0   LM LMs Index  w/ Microarousal 0 0.0  w/ Awakening 0 0.0  w/ Resp Event 0 0.0   Spontaneous 8 1.9  Total 8 1.9

## 2023-11-14 ENCOUNTER — Other Ambulatory Visit: Payer: Self-pay | Admitting: Internal Medicine

## 2023-11-14 DIAGNOSIS — Z9889 Other specified postprocedural states: Secondary | ICD-10-CM

## 2023-11-20 ENCOUNTER — Telehealth: Payer: Self-pay | Admitting: Neurology

## 2023-11-20 ENCOUNTER — Encounter: Payer: Self-pay | Admitting: Neurology

## 2023-11-20 ENCOUNTER — Ambulatory Visit: Admitting: Neurology

## 2023-11-20 VITALS — BP 136/84 | HR 89 | Ht 68.0 in | Wt 228.0 lb

## 2023-11-20 DIAGNOSIS — Z9682 Presence of neurostimulator: Secondary | ICD-10-CM | POA: Diagnosis not present

## 2023-11-20 DIAGNOSIS — G4733 Obstructive sleep apnea (adult) (pediatric): Secondary | ICD-10-CM | POA: Diagnosis not present

## 2023-11-20 DIAGNOSIS — G4734 Idiopathic sleep related nonobstructive alveolar hypoventilation: Secondary | ICD-10-CM

## 2023-11-20 NOTE — Patient Instructions (Signed)
 Tuesday November 4 th 8 PM. PSG with 02

## 2023-11-20 NOTE — Progress Notes (Signed)
 Provider:  Dedra Gores, MD  Primary Care Physician:  Theophilus Andrews, Tully GRADE, MD [address] [place] [address]     Referring Provider: Theophilus Andrews, Tully GRADE, Md [address] [place],  [place] [address]          Chief Complaint according to patient   Patient presents with:                HISTORY OF PRESENT ILLNESS:  [name] is a 73 y.o. female patient who is here for revisit 11/20/2023 for  INSPIRE after in lab titration. [name] is a patient using INSPIRE hypoglossal nerve stimulator for treatment of OSA, has also Insomnia, atrial fibrillation, Nocturia. HST in April documented on Inspire an AHI by CMS criteria of 26.7/h.  Severe sleep disordered breathing was present (AHI at onset was 60/h !)  and improved gradually and a partially with titration of Inspire - at the 2.3 Volt level ,the most effective level to control Obstructive Sleep apnea, the CMS score- based  AHI of 14.2/h was noted.     Severe OSA was associated with prolonged oxygen desaturation. Inspire did not influence oxygen levels and hypoxia remains untreated.  Increase of Inspire Voltage to 2.3 V with partial control of OSA, allowing to reduce the severe apnea to a mild/ moderate degree.  Oxygen titration in laboratory is needed for this patient whose sever sleep hypoxia was not treated by inspire ( expectedly) . Treatment of apnea does not entail treatment of insomnia but the noted hypoxia may contribute to Insomnia and may improve with oxygen supplementation.    2.3 V is the new recommended level of stimulation.     Attached  wave for of new settings and the patient 's own window of 2.1 - 2.5 V for  adjustment at home.     Review of Systems: Out of a complete 14 system review, the patient complains of only the following symptoms, and all other reviewed systems are negative.:   SLEEPINESS ?  How likely are you to doze in the following  situations: 0 = not likely, 1 = slight chance, 2 = moderate chance, 3 = high chance  Sitting and Reading? Watching Television? Sitting inactive in a public place (theater or meeting)? Lying down in the afternoon when circumstances permit? Sitting and talking to someone? Sitting quietly after lunch without alcohol ? In a car, while stopped for a few minutes in traffic? As a passenger in a car for an hour without a break?  Total =  3/ 24 FSS 20 / 63      Social History   Socioeconomic History   Marital status: Divorced    Spouse name: Not on file   Number of children: 2   Years of education: JD   Highest education level: Professional school degree (e.g., MD, DDS, DVM, JD)  Occupational History   Occupation: lawyer/RETIRED  Tobacco Use   Smoking status: Never   Smokeless tobacco: Never  Vaping Use   Vaping status: Never Used  Substance and Sexual Activity   Alcohol  use: Not Currently    Comment: rarely at [facility]    Drug use: No   Sexual activity: Not Currently    Birth control/protection: Surgical  Other Topics Concern   Not on file  Social History Narrative   Work or School: retired Publishing rights manager Situation: lives alone - takes care  of twin grandchildren 7 months in 05/2015      Spiritual Beliefs:       Lifestyle: active, healthy diet      Pt lives alone    Social Drivers of Health   Financial Resource Strain: Low Risk  (12/29/2022)   Overall Financial Resource Strain (CARDIA)    Difficulty of Paying Living Expenses: Not very hard  Food Insecurity: No Food Insecurity (12/29/2022)   Hunger Vital Sign    Worried About Running Out of Food in the Last Year: Never true    Ran Out of Food in the Last Year: Never true  Transportation Needs: No Transportation Needs (12/29/2022)   PRAPARE - Administrator, Civil Service (Medical): No    Lack of Transportation (Non-Medical): No  Physical Activity: Sufficiently Active (12/29/2022)   Exercise Vital  Sign    Days of Exercise per Week: 7 days    Minutes of Exercise per Session: 30 min  Stress: No Stress Concern Present (12/29/2022)   [employer] of Occupational Health - Occupational Stress Questionnaire    Feeling of Stress : Only a little  Social Connections: Moderately Integrated (12/29/2022)   Social Connection and Isolation Panel    Frequency of Communication with Friends and Family: More than three times a week    Frequency of Social Gatherings with Friends and Family: More than three times a week    Attends Religious Services: More than 4 times per year    Active Member of [employer] or Organizations: Yes    Attends Engineer, structural: More than 4 times per year    Marital Status: Divorced    Family History  Problem Relation Age of Onset   Uterine cancer Mother    Emphysema Mother    Hypertension Father 17   Hyperlipidemia Father    Epilepsy Father 59   Colon cancer Father 48   Leukemia Father    Arthritis Sister 40   Diabetes Brother 66   Cancer Paternal Uncle        unk type   Cancer Maternal Grandmother        unk type possibly breast or skin   Other Paternal Grandmother        influenza    Stroke Paternal Grandfather    Skin cancer Daughter        SCC on back   Esophageal cancer Neg Hx    Rectal cancer Neg Hx    Stomach cancer Neg Hx    Sleep apnea Neg Hx     Past Medical History:  Diagnosis Date   Chronic edema    Clotting disorder (HCC)    Constipation    Difficult intubation    pt states that her neck needs to be in a neutral position,  scoliosisand herniated dics in neck   Family history of colon cancer    Family history of uterine cancer    Gallstones    GERD (gastroesophageal reflux disease)    protonix , hx h. pylori   Herniated disc, cervical    History of hiatal hernia    History of sebaceous adenoma    History of uterine cancer 04/22/2014   sees Dr. 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 Tricia in dermatology right upper arm   Obesity    OSA on CPAP    uses CPAP   Paroxysmal atrial  fibrillation (HCC)    Peripheral vascular disease (HCC)    Personal history of radiation therapy    PONV (postoperative nausea and vomiting)    Recurrent UTI    S/P hip replacement    Scoliosis    Sleep apnea    Thyroid  nodule    Urinary incontinence    Uterine cancer (HCC)    Stage 1   Venous insufficiency    s/p ablation    Past Surgical History:  Procedure Laterality Date   ABDOMINAL HYSTERECTOMY  09/10/2013   ATRIAL FIBRILLATION ABLATION N/A 08/09/2019   Procedure: ATRIAL FIBRILLATION ABLATION;  Surgeon: Inocencio Soyla Lunger, MD;  Location: [facility];  Service: Cardiovascular;  Laterality: N/A;   BREAST LUMPECTOMY WITH RADIOACTIVE SEED AND SENTINEL LYMPH NODE BIOPSY Left 02/16/2021   Procedure: LEFT BREAST LUMPECTOMY WITH RADIOACTIVE SEED X2 AND SENTINEL LYMPH NODE BIOPSY;  Surgeon: Vanderbilt Ned, MD;  Location: [facility];  Service: General;  Laterality: Left;   BUNIONECTOMY  10/1976   CHOLECYSTECTOMY N/A 03/12/2019   Procedure: XI ROBOTIC ASSISTED CHOLECYSTECTOMY;  Surgeon: Signe Mitzie LABOR, MD;  Location: [facility];  Service: General;  Laterality: N/A;   COLONOSCOPY     COLONOSCOPY WITH PROPOFOL  N/A 01/17/2020   Procedure: COLONOSCOPY WITH PROPOFOL ;  Surgeon: Charlanne Groom, MD;  Location: [facility];  Service: Endoscopy;  Laterality: N/A;   DRUG INDUCED ENDOSCOPY N/A 03/04/2020   Procedure: DRUG INDUCED ENDOSCOPY;  Surgeon: Carlie Clark, MD;  Location: [facility];  Service: ENT;  Laterality: N/A;   FRACTURE SURGERY  09/1964   floor of orbit right side   HIATAL HERNIA REPAIR     IMPLANTATION OF HYPOGLOSSAL NERVE STIMULATOR Right 04/15/2020   Procedure: IMPLANTATION OF HYPOGLOSSAL NERVE STIMULATOR;  Surgeon: Carlie Clark, MD;  Location:  SURGERY CENTER;  Service: ENT;   Laterality: Right;   JOINT REPLACEMENT     bilateral hip replacements   MELANOMA EXCISION  12/2011   POLYPECTOMY  01/17/2020   Procedure: POLYPECTOMY;  Surgeon: Charlanne Groom, MD;  Location: [facility];  Service: Endoscopy;;   TOTAL HIP ARTHROPLASTY Left 06/17/2014   Procedure: LEFT TOTAL HIP ARTHROPLASTY ANTERIOR APPROACH;  Surgeon: Lonni CINDERELLA Poli, MD;  Location: [facility];  Service: Orthopedics;  Laterality: Left;   TOTAL HIP ARTHROPLASTY Right 09/02/2014   Procedure: RIGHT TOTAL HIP ARTHROPLASTY ANTERIOR APPROACH;  Surgeon: Lonni CINDERELLA Poli, MD;  Location: [facility];  Service: Orthopedics;  Laterality: Right;   VEIN SURGERY  05/2011   venous ablation      Current Outpatient Medications on File Prior to Visit  Medication Sig Dispense Refill   acetaminophen  (TYLENOL ) 500 MG tablet Take 2 tablets (1,000 mg total) by mouth every 6 (six) hours as needed.  0   ALPRAZolam  (XANAX ) 0.5 MG tablet Take 1 tablet (0.5 mg total) by mouth at bedtime as needed for sleep (for use in the sleep lab during testing.). 3 tablet 0   busPIRone  (BUSPAR ) 5 MG tablet Take 1 tablet (5 mg total) by mouth at bedtime as needed. 90 tablet 2   Cranberry 500 MG TABS Take 1,000 mg by mouth 2 (two) times daily.     Cyanocobalamin  (VITAMIN B 12) 500 MCG TABS Take 1 tablet by mouth daily.     cyclobenzaprine  (FLEXERIL ) 5 MG tablet Take 1 tablet (5 mg total) by mouth at bedtime as needed for muscle spasms. 30 tablet 1   diltiazem  (TIAZAC ) 240 MG 24 hr capsule TAKE 1 CAPSULE BY MOUTH EVERY  DAY 90 capsule 3   ELIQUIS  5 MG TABS tablet TAKE 1 TABLET(5 MG) BY MOUTH TWICE DAILY. 180 tablet 1   famotidine (PEPCID) 20 MG tablet Take 20 mg by mouth 2 (two) times daily.     ibuprofen  (ADVIL ) 200 MG tablet Take 400 mg by mouth every 6 (six) hours as needed (pain).     letrozole  (FEMARA ) 2.5 MG tablet Take 1 tablet (2.5 mg total) by mouth daily. 90 tablet 3   meloxicam  (MOBIC ) 7.5 MG tablet TAKE 1 TABLET BY MOUTH DAILY FOR 10 DAYS.  ADDITIONAL TABLETS ONLY IF NEEDED 30 tablet 0   OVER THE COUNTER MEDICATION Take 20 mg by mouth daily as needed (arthritis). CBD oil     polyethylene glycol (MIRALAX  / GLYCOLAX ) 17 g packet Take 17 g by mouth as needed (as directed).     sennosides-docusate sodium  (SENOKOT-S) 8.6-50 MG tablet Take 1 tablet by mouth as needed for constipation.     spironolactone  (ALDACTONE ) 50 MG tablet TAKE 2 TABLETS BY MOUTH EVERY MORNING AND 1 TABLET EVERY EVENING 270 tablet 0   triamcinolone cream (KENALOG) 0.1 % Apply 1 application topically daily as needed (for dry skin).   1   Vibegron (GEMTESA) 75 MG TABS Take 75 mg by mouth daily.     vitamin C (ASCORBIC ACID) 500 MG tablet Take 500 mg by mouth 2 (two) times daily.     No current facility-administered medications on file prior to visit.    Allergies  Allergen Reactions   Hydrolyzed Silk Hives and Other (See Comments)    Silk tape-blisters   Ciprofloxacin Other (See Comments)    Reactions with antiarrythmic Reactions with antiarrythmic   Gabapentin     Gluten Meal Other (See Comments)    reflux   Metoprolol      dizzy     DIAGNOSTIC DATA (LABS, IMAGING, TESTING) - I reviewed patient records, labs, notes, testing and imaging myself where available.  Lab Results  Component Value Date   WBC 4.6 01/03/2023   HGB 14.5 01/03/2023   HCT 43.0 01/03/2023   MCV 104.8 (H) 01/03/2023   PLT 226.0 01/03/2023      Component Value Date/Time   NA 133 (L) 01/03/2023 1107   NA 137 11/04/2022 0843   K 4.8 01/03/2023 1107   CL 98 01/03/2023 1107   CO2 27 01/03/2023 1107   GLUCOSE 92 01/03/2023 1107   BUN 19 01/03/2023 1107   BUN 16 11/04/2022 0843   CREATININE 0.79 01/03/2023 1107   CREATININE 0.88 06/22/2016 0930   CALCIUM  9.7 01/03/2023 1107   PROT 6.9 01/03/2023 1107   ALBUMIN  4.6 01/03/2023 1107   AST 22 01/03/2023 1107   ALT 26 01/03/2023 1107   ALKPHOS 78 01/03/2023 1107   BILITOT 0.7 01/03/2023 1107   GFRNONAA >60 02/10/2021 0900    GFRAA 89 08/07/2019 0825   Lab Results  Component Value Date   CHOL 148 01/03/2023   HDL 39.20 01/03/2023   LDLCALC 87 01/03/2023   TRIG 108.0 01/03/2023   CHOLHDL 4 01/03/2023   Lab Results  Component Value Date   HGBA1C 5.5 11/27/2020   Lab Results  Component Value Date   VITAMINB12 1,247 (H) 01/03/2023   Lab Results  Component Value Date   TSH 2.32 01/03/2023    PHYSICAL EXAM:  Vitals:   11/20/23 0901  BP: 136/84  Pulse: 89   No data found. Body mass index is 34.67 kg/m.   Wt Readings from Last 3 Encounters:  11/20/23 228 lb (103.4 kg)  08/23/23 228 lb (103.4 kg)  05/17/23 239 lb (108.4 kg)     Ht Readings from Last 3 Encounters:  11/20/23 5' 8 (1.727 m)  08/23/23 5' 8 (1.727 m)  05/17/23 5' 9 (1.753 m)      General: The patient is awake, alert and appears not in acute distress and groomed. Head: Normocephalic, atraumatic.  Neck is supple. Trunk: BMI is 35    NEUROLOGIC EXAM: The patient is awake and alert, oriented to place and time.   Memory subjective described as intact.  Attention span & concentration ability appears normal.   Speech is fluent,  without  dysarthria, dysphonia or aphasia.  Mood and affect are appropriate.   Neurological Examination: Mental Status: Intact. Language and speech are normal. No cognitive deficits. Cranial Nerves II-XII: Intact. PERL. EOMI. VFF. No nystagmus.  No facial droop.  No ptosis.  Hearing is grossly intact bilaterally.  The tongue is normal and midline. Motor: Strengths are 5/5 throughout. Muscle bulk and tone are normal. No tremors.   ASSESSMENT AND PLAN :   73 y.o. year old female  here with:    1) inspire , partial control of OSA with some remaining hypoxia.   2) insomnia   3)  has questions about vagal nerve stimulation for hypopnea treatment - something I have never used before. And would it be used in her case / I doubt it would be beneficial.   Return for in-lab to seen  oxygen on  inspire and need for 02  titration.       I would like to thank Theophilus Andrews, Tully GRADE, MD and Theophilus Andrews, Tully GRADE, Md [address] [place],  [place] [address] for allowing me to meet with this pleasant patient.   Sleep Clinic Patients are generally offered input on sleep hygiene, life style changes and how to improve compliance with medical treatment where applicable. Review and reiteration of good sleep hygiene measures is offered to any sleep clinic patient, be it in the first consultation or with any follow up visits.    Any patient with sleepiness should be cautioned not to drive, work at heights, or operate dangerous or heavy equipment when feeling tired or sleepy.      The patient will be seen in follow-up in the sleep clinic at [facility] for discussion of test results, sleep related symptoms and treatment compliance review, further management strategies, etc.   The referring provider will be notified of the test results.   The patient's condition requires frequent monitoring and adjustments in the treatment plan, reflecting the ongoing complexity of care.  This provider is the continuing focal point for all needed services for this condition.  After spending a total time of  29  minutes face to face and time for  history taking, physical and neurologic examination, review of laboratory studies,  personal review of imaging studies, reports and results of other testing and review of referral information / records as far as provided in visit,   Electronically signed by: Dedra Gores, MD 11/20/2023 9:22 AM  [facility] Board certified by The [employer] of Sleep Medicine and Diplomate of the [employer] of Sleep Medicine. Board certified In Neurology through the ABPN, Fellow of the [employer] of Neurology.

## 2023-11-20 NOTE — Telephone Encounter (Signed)
 Patient was seen today for an inspire follow up. Inspire rep Cheron was here.

## 2023-11-20 NOTE — Telephone Encounter (Signed)
 [name]

## 2023-11-23 ENCOUNTER — Telehealth: Payer: Self-pay | Admitting: Neurology

## 2023-11-23 NOTE — Telephone Encounter (Signed)
 NPSG HTA PENDING    NPSG-Inspire patient-Leave at 2.3 volts (level 3 on remote) during the study--This study is to determine if patient needs oxygen.

## 2023-11-24 ENCOUNTER — Other Ambulatory Visit: Payer: Self-pay | Admitting: Cardiology

## 2023-12-06 ENCOUNTER — Other Ambulatory Visit: Payer: Self-pay | Admitting: Cardiology

## 2023-12-06 DIAGNOSIS — I4891 Unspecified atrial fibrillation: Secondary | ICD-10-CM

## 2023-12-06 NOTE — Telephone Encounter (Addendum)
 Eliquis  5mg  refill request received. Patient is 73 years old, weight-103.4kg, Crea-0.79 on 01/03/23, Diagnosis-Afib, and last seen by Jodie Passey on 11/04/22-and due in a year. Dose is appropriate based on dosing criteria.    Pt needs an appointment.   Transferred her to appointment line

## 2023-12-07 NOTE — Telephone Encounter (Signed)
 NPSG HTA shara: [id number] (exp. 11/23/23 to 02/13/24) Inspire patient Leave at 2.3 volts (level 3 on remote) during the study--This study is to determine if patient needs oxygen.    Patient is scheduled at [facility] for 01/16/24.

## 2023-12-08 ENCOUNTER — Other Ambulatory Visit: Payer: Self-pay | Admitting: Cardiology

## 2023-12-12 ENCOUNTER — Other Ambulatory Visit: Payer: Self-pay | Admitting: *Deleted

## 2023-12-12 DIAGNOSIS — N3281 Overactive bladder: Secondary | ICD-10-CM | POA: Diagnosis not present

## 2023-12-12 DIAGNOSIS — N3020 Other chronic cystitis without hematuria: Secondary | ICD-10-CM | POA: Diagnosis not present

## 2023-12-12 MED ORDER — DILTIAZEM HCL ER BEADS 240 MG PO CP24: 240.0000 mg | ORAL_CAPSULE | Freq: Every day | ORAL | 0 refills | Status: DC

## 2023-12-19 ENCOUNTER — Other Ambulatory Visit: Payer: Self-pay | Admitting: Cardiology

## 2023-12-19 DIAGNOSIS — I4891 Unspecified atrial fibrillation: Secondary | ICD-10-CM

## 2023-12-21 ENCOUNTER — Other Ambulatory Visit: Payer: Self-pay | Admitting: Student

## 2023-12-21 NOTE — Telephone Encounter (Signed)
 Prescription refill request for Eliquis  received. Indication:afib  Last office visit: Lesia 11/04/2022 Scr:0.79, 01/03/2023 Age: 73 yo  Weight: 103.4 kg   Scheduled to see Jodie on 12/25/2023

## 2023-12-25 ENCOUNTER — Ambulatory Visit: Attending: Student | Admitting: Student

## 2023-12-25 ENCOUNTER — Encounter: Payer: Self-pay | Admitting: Student

## 2023-12-25 VITALS — BP 118/64 | HR 71 | Ht 68.0 in | Wt 219.0 lb

## 2023-12-25 DIAGNOSIS — I480 Paroxysmal atrial fibrillation: Secondary | ICD-10-CM | POA: Diagnosis not present

## 2023-12-25 DIAGNOSIS — G4733 Obstructive sleep apnea (adult) (pediatric): Secondary | ICD-10-CM | POA: Diagnosis not present

## 2023-12-25 LAB — CBC

## 2023-12-25 MED ORDER — SPIRONOLACTONE 50 MG PO TABS: ORAL_TABLET | ORAL | 0 refills | Status: DC

## 2023-12-25 NOTE — Patient Instructions (Signed)
 Medication Instructions:  Your physician recommends that you continue on your current medications as directed. Please refer to the Current Medication list given to you today.  *If you need a refill on your cardiac medications before your next appointment, please call your pharmacy*  Lab Work: BMET, CBC If you have labs (blood work) drawn today and your tests are completely normal, you will receive your results only by: MyChart Message (if you have MyChart) OR A paper copy in the mail If you have any lab test that is abnormal or we need to change your treatment, we will call you to review the results.  Testing/Procedures: None ordered  Follow-Up: At [facility], you and your health needs are our priority.  As part of our continuing mission to provide you with exceptional heart care, our providers are all part of one team.  This team includes your primary Cardiologist (physician) and Advanced Practice Providers or APPs (Physician Assistants and Nurse Practitioners) who all work together to provide you with the care you need, when you need it.  Your next appointment:   1 year(s)  Provider:   Soyla Norton, MD    We recommend signing up for the patient portal called MyChart.  Sign up information is provided on this After Visit Summary.  MyChart is used to connect with patients for Virtual Visits (Telemedicine).  Patients are able to view lab/test results, encounter notes, upcoming appointments, etc.  Non-urgent messages can be sent to your provider as well.   To learn more about what you can do with MyChart, go to [email or web address].

## 2023-12-25 NOTE — Progress Notes (Signed)
  Electrophysiology Office Note:   Date:  12/25/2023  ID:  [name], DOB Apr 17, 1950, MRN [id number]  Primary Cardiologist: None Electrophysiologist: Will Gladis Norton, MD   Electrophysiologist:  Soyla Gladis Norton, MD      History of Present Illness:   [name] is a 73 y.o. female with h/o AF, OSA with hypoglossal stimular and Bipap seen today for routine electrophysiology followup.   Since last being seen in our clinic the patient reports doing very well. She would like to try and decrease her medications overall, asks about lowering eliquis . Otherwise, she denies chest pain, palpitations, dyspnea, PND, orthopnea, nausea, vomiting, dizziness, syncope, edema, weight gain, or early satiety.   Review of systems complete and found to be negative unless listed in HPI.   EP Information / Studies Reviewed:    EKG is ordered today. Personal review as below.  EKG Interpretation Date/Time:  Monday December 25 2023 09:21:19 EDT Ventricular Rate:  71 PR Interval:  198 QRS Duration:  82 QT Interval:  380 QTC Calculation: 412 R Axis:   0  Text Interpretation: Normal sinus rhythm Normal ECG When compared with ECG of 04-Nov-2022 08:19, Questionable change in QRS axis Confirmed by Lesia Sharper [id number]) on 12/25/2023 9:28:07 AM    Arrhythmia/Device History No specialty comments available.   Physical Exam:   VS:  BP 118/64 (BP Location: Right Arm, Patient Position: Sitting, Cuff Size: Large)   Pulse 71   Ht 5' 8 (1.727 m)   Wt 219 lb (99.3 kg)   SpO2 97%   BMI 33.30 kg/m    Wt Readings from Last 3 Encounters:  12/25/23 219 lb (99.3 kg)  11/20/23 228 lb (103.4 kg)  08/23/23 228 lb (103.4 kg)     GEN: No acute distress NECK: No JVD; No carotid bruits CARDIAC: Regular rate and rhythm, no murmurs, rubs, gallops RESPIRATORY:  Clear to auscultation without rales, wheezing or rhonchi  ABDOMEN: Soft, non-tender, non-distended EXTREMITIES:  No edema; No deformity    ASSESSMENT AND PLAN:    Paroxysmal atrial fibrillation S/p ablation 07/2019 EKG today shows NSR with stable intervals.  Continue eliquis  for CHA2DS2VASc of at least 3 Labwork today.   OSA  Encouraged nightly CPAP/BiPAP Managed at outside hospital. Has INSPIRE device.   Follow up with EP Team in 12 months  Signed, Sharper Prentice Lesia, PA-C

## 2023-12-26 ENCOUNTER — Ambulatory Visit: Payer: Self-pay | Admitting: Student

## 2023-12-26 LAB — BASIC METABOLIC PANEL WITH GFR
BUN/Creatinine Ratio: 19 (ref 12–28)
BUN: 14 mg/dL (ref 8–27)
CO2: 22 mmol/L (ref 20–29)
Calcium: 9.7 mg/dL (ref 8.7–10.3)
Chloride: 101 mmol/L (ref 96–106)
Creatinine, Ser: 0.75 mg/dL (ref 0.57–1.00)
Glucose: 82 mg/dL (ref 70–99)
Potassium: 4.6 mmol/L (ref 3.5–5.2)
Sodium: 138 mmol/L (ref 134–144)
eGFR: 83 mL/min/1.73 (ref >59)

## 2023-12-26 LAB — CBC
Hematocrit: 44.5 % (ref 34.0–46.6)
Hemoglobin: 14.7 g/dL (ref 11.1–15.9)
MCH: 35.7 pg — AB (ref 26.6–33.0)
MCHC: 33 g/dL (ref 31.5–35.7)
MCV: 108 fL — AB (ref 79–97)
Platelets: 194 x10E3/uL (ref 150–450)
RBC: 4.12 x10E6/uL (ref 3.77–5.28)
RDW: 12.4 % (ref 11.7–15.4)
WBC: 4.7 x10E3/uL (ref 3.4–10.8)

## 2023-12-27 ENCOUNTER — Other Ambulatory Visit: Payer: Self-pay | Admitting: Medical Genetics

## 2024-01-03 ENCOUNTER — Ambulatory Visit
Admission: RE | Admit: 2024-01-03 | Discharge: 2024-01-03 | Disposition: A | Discharge status: Home / Self Care | Source: Ambulatory Visit | Attending: Internal Medicine | Admitting: Internal Medicine

## 2024-01-03 DIAGNOSIS — R928 Other abnormal and inconclusive findings on diagnostic imaging of breast: Secondary | ICD-10-CM | POA: Diagnosis not present

## 2024-01-03 DIAGNOSIS — Z9889 Other specified postprocedural states: Secondary | ICD-10-CM

## 2024-01-03 HISTORY — DX: Malignant neoplasm of unspecified site of unspecified female breast: C50.919

## 2024-01-15 ENCOUNTER — Encounter: Payer: Self-pay | Admitting: Radiology

## 2024-01-16 ENCOUNTER — Ambulatory Visit (INDEPENDENT_AMBULATORY_CARE_PROVIDER_SITE_OTHER): Admitting: Neurology

## 2024-01-16 DIAGNOSIS — G4733 Obstructive sleep apnea (adult) (pediatric): Secondary | ICD-10-CM

## 2024-01-16 DIAGNOSIS — Z9682 Presence of neurostimulator: Secondary | ICD-10-CM

## 2024-01-16 DIAGNOSIS — F5104 Psychophysiologic insomnia: Secondary | ICD-10-CM

## 2024-01-26 ENCOUNTER — Ambulatory Visit: Payer: Self-pay | Admitting: Neurology

## 2024-01-26 DIAGNOSIS — Z9682 Presence of neurostimulator: Secondary | ICD-10-CM

## 2024-01-26 DIAGNOSIS — G4733 Obstructive sleep apnea (adult) (pediatric): Secondary | ICD-10-CM

## 2024-01-26 DIAGNOSIS — F5104 Psychophysiologic insomnia: Secondary | ICD-10-CM

## 2024-01-26 NOTE — Procedures (Signed)
 [facility] CPAP/Bilevel Report    General Information  Name: [name], [name] BMI: 65 Physician: Chalice Saunas   ID: [id number] Height: 68 in Technician: Harvey Brisker  Sex: Female Weight: 228 lb Record: [id number]  Age: 73 [[birth year]] Date: 01/16/2024 Scorer: Brisker Harvey   Recommended Settings IPAP: N/A cmH20 EPAP: N/A cmH2O AHI: N/A AHI (4%): N/A   Voltage  00 00 2.3V 2.3V   O2 Vol 0.0 1.0 1.0 0.0  Time TRT 103.79m 93.35m 182.15m 20.62m   TST 56.70m 65.72m 155.58m 16.35m  Sleep Stage % Wake 45.4 30.1 15.1 20.0   % REM 0.0 0.0 21.0 0.0   % N1 11.5 16.9 0.6 0.0   % N2 78.8 83.1 41.3 18.8   % N3 9.7 0.0 37.1 81.3  Respiratory Total Events 22 26 16  0   Obs. Apn. 1 1 1  0   Mixed Apn. 0 2 0 0   Cen. Apn. 0 1 2 0   Hypopneas 21 22 13  0   AHI 23.36 24.00 6.19 0.00   Supine AHI 31.17 39.40 7.83 0.00   Prone AHI 0.00 0.00 0.00 0.00   Side AHI 6.67 7.62 4.88 0.00  Respiratory (4%) Hypopneas (4%) 12.00 9.00 5.00 0.00   AHI (4%) 13.81 12.00 3.10 0.00   Supine AHI (4%) 20.26 21.49 5.22 0.00   Prone AHI (4%) 0.00 0.00 0.00 0.00   Side AHI (4%) 0.00 1.90 1.40 0.00  Desat Profile <= 90% 34.17m 1.44m 20.33m 9.18m   <= 80% 0.70m 0.27m 12.18m 0.80m   <= 70% 0.103m 0.88m 12.19m 0.37m   <= 60% 0.80m 0.30m 12.15m 0.38m  Arousal Index Apnea 1.1 3.7 0.0 0.0   Hypopnea 13.8 17.5 1.9 3.8   LM 0.0 0.0 0.4 3.8   Spontaneous 15.9 18.5 10.5 3.8   [facility] POLYSOMNOGRAPHY  INTERPRETATION REPORT   STUDY DATE:  01/16/2024     PATIENT NAME:  [name]         DATE OF BIRTH:  01-01-1951  PATIENT ID:  [id number]    TYPE OF STUDY:  PSG  READING PHYSICIAN: SAUNAS CHALICE, MD REFERRED BY: Dr Estel Dr TheophilusGLENWOOD Andrews SCORING TECHNICIAN: Brisker Harvey, RPSGT   HISTORY:   HISTORY: 73 year old right-handed patient [name] is a patient using INSPIRE hypoglossal nerve stimulator for treatment of OSA, has also Insomnia, atrial  fibrillation, Nocturia. HST in April documented on Inspire an AHI by CMS criteria of 26.7/h. The last Polysomnography was performed  with active Inspire in place- results from 11-05-2023: Severe sleep disordered breathing was present (AHI at onset was 60/h !) and improved gradually and a partially with titration of Inspire - at the 2.3 Volt level ,the most effective level to control Obstructive Sleep apnea, the CMS score- based AHI of 14.2/h was noted. ?  Severe OSA was associated with prolonged oxygen desaturation. Inspire did not influence oxygen levels and hypoxia remains untreated.  Sleep efficiency was low. Sleep fragmentation was noted- The majority of sleep arousals was related to respiratory arousals. Most of the prolonged wakefulness periods were related to nocturia (3 times)  RECOMMENDATIONS:  Increase of Inspire Voltage to 2.3 V with partial control of OSA, allowing to reduce the severe apnea to a mild/ moderate degree.  Oxygen titration in laboratory is needed for this patient whose sever sleep hypoxia was expectantly not corrected by Inspire .  Insomnia:Treatment of apnea does not entail treatment of insomnia but the noted hypoxia may  contribute to Insomnia and may thereby improve with oxygen supplementation. ? CURRENT MEDICATIONS: Tylenol , Xanax , Buspar , Cranberry, Vitamin B-12, Flexeril , Tiazac , Eliquis , Pepcid, Advil , Femara , Mobic , CBD Oil, Miralax , Senokot-S, Aldactone , Kenalog, Gemtesa, Vitamin C, plus  XANAX  as sleep aid.  PROCEDURE:   This  multichannel digital polysomnogram is utilizing the Polysmith 12.0 system. Electrodes and sensors were applied and monitored per AASM Specifications. EEG, EOG, Chin and Limb EMG, were sampled at 200 Hz. ECG, Snore and Nasal Pressure, Thermal Airflow, Respiratory Effort, CPAP Flow and Pressure, Oximetry was sampled at 50 Hz. Digital video and audio were recorded. A RPSGT attended this study.:  ADDITIONAL INFORMATION:  The Epworth Sleepiness Scale was  last endorsed at 3 /24 points (scores above or equal to 10 are suggestive of hypersomnolence).  Height: 68 in Weight: 228 lb (BMI 34) Neck Size: 15 in  MEDICATIONS: Tylenol , Xanax , Buspar , Cranberry, Vitamin B-12, Flexeril , Tiazac , Eliquis , Pepcid, Advil , Femara , Mobic , CBD Oil, Miralax , Senokot-S, Aldactone , Kenalog, Gemtesa, Vitamin C   TECHNICAL DESCRIPTION: A registered sleep technologist ( RPSGT)  was in attendance for the duration of the recording.  Data collection, scoring, video monitoring, and reporting were performed in compliance with the AASM Manual for the Scoring of Sleep and Associated Events; (Hypopnea is scored based on the criteria listed in Section VIII D. 1b in the AASM Manual V2.6 using a 4% oxygen desaturation rule or Hypopnea is scored based on the criteria listed in Section VIII D. 1a in the AASM Manual V2.6 using 3% oxygen desaturation and /or arousal rule).  BASELINE STUDY:  Inspire ( hypoglossal Nerve stimulator settings remained at 2.3Volts , which is the current therapy setting and incoming amplitude . Oxygen was titrated  at 1 Liter/minute. There was moderate snoring recorded. The AHI was 9.3/h at 2 hours of sleep. There were over 80 minutes of wakefulness after sleep onset and the patient paused her device multiple times but denied discomfort.    SLEEP CONTINUITY AND SLEEP ARCHITECTURE:  Lights-out was at 22:15: and lights-on at  04:54:, with  6.7 minutes of recording time . Total sleep time ( TST) was 292.5 minutes with a decreased sleep efficiency at 73.3%.   Sleep latency was normal at 19.0 minutes.  REM sleep latency was increased at 147.0 minutes. Of the total sleep time, the percentage of stage N1 sleep was 6.3%, stage N2 sleep was 57%, stage N3 sleep was 26.0%, and REM sleep was 11.1%.  There were 2 Stage R periods observed on this study night, 25 awakenings (i.e. transitions to Stage W from any sleep stage), and 97 total stage transitions. Wake after sleep onset  (WASO) time accounted for 87.5 minutes . Duration of total sleep and percent of total sleep in their respective position is as follows: supine 149 minutes (51%), non-supine 143 minutes (49%); right 00 minutes (0%), left 143 minutes (49%), and prone 00 minutes (0%).  Total supine REM sleep time was 21 minutes (65% of total REM sleep).  RESPIRATORY MONITORING:   Based on CMS criteria (using a 4% oxygen desaturation rule for scoring hypopneas), there were 8 apneas (3 obstructive; 3 central; 2 mixed), and 26 hypopneas.  ( Complex sleep apnea)  Apnea index was 1.6/h. Hypopnea index was 5.3/h. The AHI ( apnea-hypopnea index) was 7.0/h overall (12.4/h in supine, 1.3/h in non-supine; 5.5/h in REM, and 7.15/h in NREM).  There were 0 respiratory effort-related arousals (RERAs).   OXIMETRY: Oxyhemoglobin Saturation Nadir during sleep was at  85% from a mean of 93%.  Total sleep time (TST) in hypoxemia (<89%) was 18.4 minutes, or 6.3% of total sleep time.   LIMB MOVEMENTS: There were 178 periodic limb movements of sleep (36.5/hr), of which only 2 (0.4/hr) were associated with an arousal. AROUSAL: There were 105 arousals in total, for an arousal index of 22 arousals/hour.  Of these, 43 were identified as respiratory-related arousals (9 /h), 2 were PLM-related arousals (0 /h), and 62 were non-specific arousals (13 /h). There were 0 occurrences of Cheyne Stokes breathing.  Snoring was classified as . EEG:  PSG EEG was of normal amplitude and frequency, with symmetric manifestation of sleep stages. EKG: The electrocardiogram documented NSR and some PVCs but no atrial fibrillation.   The average heart rate during sleep was 69 bpm.  The heart rate during sleep varied between a minimum of  62 bpm and  a maximum of 78 bpm. AUDIO and VIDEO:  There were many periods of wakefulness noted, arousals were spontaneous.   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  2) Hypoxia was again found and corrected with 1 l oxygen flow per minute.  3) Periodic limb movements were seen but did not lead to arousals.  The patient slept on her left side when PLMs occurred.  None were seen in other positions.  4) Insomnia : Total sleep time was reduced at 292.5 minutes.  Sleep efficiency was decreased at 73.3%.     RECOMMENDATIONS: 1) The remaining apnea (AHI)  responded to addition of oxygen, but this did not lead to more sustained or less fragmented sleep. 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   3) Primary Insomnia remains unaffected by therapies that address organic sleep disorders. See comments in original copy for further treatment options.   DEDRA GORES,  MD

## 2024-01-29 ENCOUNTER — Telehealth: Payer: Self-pay | Admitting: Neurology

## 2024-01-29 DIAGNOSIS — L578 Other skin changes due to chronic exposure to nonionizing radiation: Secondary | ICD-10-CM | POA: Diagnosis not present

## 2024-01-29 DIAGNOSIS — L814 Other melanin hyperpigmentation: Secondary | ICD-10-CM | POA: Diagnosis not present

## 2024-01-29 DIAGNOSIS — L821 Other seborrheic keratosis: Secondary | ICD-10-CM | POA: Diagnosis not present

## 2024-01-29 DIAGNOSIS — I872 Venous insufficiency (chronic) (peripheral): Secondary | ICD-10-CM | POA: Diagnosis not present

## 2024-01-29 DIAGNOSIS — D485 Neoplasm of uncertain behavior of skin: Secondary | ICD-10-CM | POA: Diagnosis not present

## 2024-01-29 DIAGNOSIS — Z85820 Personal history of malignant melanoma of skin: Secondary | ICD-10-CM | POA: Diagnosis not present

## 2024-01-29 DIAGNOSIS — D225 Melanocytic nevi of trunk: Secondary | ICD-10-CM | POA: Diagnosis not present

## 2024-01-29 NOTE — Telephone Encounter (Signed)
 Sent mychart

## 2024-01-31 ENCOUNTER — Other Ambulatory Visit: Payer: Self-pay | Admitting: Student

## 2024-02-01 NOTE — Telephone Encounter (Signed)
-----   Message from [place] Dohmeier sent at 01/26/2024  1:24 PM EST ----- Oxygen did help to bring up the oxygen saturation and reduced the remaining non-obstructive apnea , but it didn't help with the insomnia.  Insomnia is non-organic. Oxygen at 1 liter can be added per nasal canula for nighttime use  ----- Message ----- From: Interface, Scanned Link In One Three One Sent: 01/26/2024   1:09 PM EST To: Dedra Gores, MD

## 2024-02-01 NOTE — Telephone Encounter (Signed)
 RE: oxygen with cpap Received: Today Konnie Tawni Neysa Nena GORMAN, RN; Sheree Leveda Viktoria Ephraim Ephraim - please review for O2 at night.  Thanks! Christina     Previous Messages    ----- Message ----- From: Neysa Nena GORMAN, RN Sent: 02/01/2024  12:24 PM EST To: Tawni Konnie; Dolanda Tucker; Melissa Z* Subject: oxygen with cpap                              New order in EPIC for pt for oxygen to use at night for apnea.  [name] Female, 73 y.o., 1950-05-25 Pronouns: she/her/hers MRN: [id number]   Thanks,    Particia SAUNDERS

## 2024-02-01 NOTE — Telephone Encounter (Signed)
 RE: oxygen with cpap Received: Today Viktoria Ephraim Neysa Nena GORMAN, RN; Sheree Glatter; [place], Christina; Tucker, Dolanda; Ziegler, Melissa Got it....thanks     Previous Messages    ----- Message ----- From: Neysa Nena GORMAN, RN Sent: 02/01/2024  12:24 PM EST To: Tawni Saran; Dolanda Tucker; Melissa Z* Subject: oxygen with cpap                              New order in EPIC for pt for oxygen to use at night for apnea.  [name], 73 y.o., [birth year] Pronouns: she/her/hers MRN: [id number]   Thanks,    Particia SAUNDERS

## 2024-02-01 NOTE — Telephone Encounter (Signed)
 I called pt, spoke to pt relayed results.  She verbalized understanding.  Redid order (as needed amount to use nightly 1LNC). Order sent to [facility].   Pt did ask if trazodone was an option for her to help sleep. I did relay that insomnia was non organic.

## 2024-02-05 NOTE — Telephone Encounter (Signed)
 RE: oxygen with cpap Received: 4 days ago Tucker, Dolanda  Tucker, Dolanda; Neysa Nena RAMAN, RN; Sheree Glatter; [place], Christina; Ziegler, Melissa Does this patient have a cpap with another company not seeing that we have provided any equipment to her cpap or dme.....confused if this is just nocturnal o2     Previous Messages    ----- Message ----- From: Viktoria Hawks Sent: 02/01/2024   1:29 PM EST To: Tawni Saran; Dolanda Tucker; Melissa Z* Subject: RE: oxygen with cpap                          Got it....thanks ----- Message ----- From: Neysa Nena RAMAN, RN Sent: 02/01/2024  12:24 PM EST To: Tawni Saran; Dolanda Tucker; Melissa Z* Subject: oxygen with cpap                              New order in EPIC for pt for oxygen to use at night for apnea.  [name] Roll Female, 73 y.o., [birth year] Pronouns: she/her/hers MRN: [id number]   Thanks,    Particia RN

## 2024-02-12 DIAGNOSIS — G4733 Obstructive sleep apnea (adult) (pediatric): Secondary | ICD-10-CM | POA: Diagnosis not present

## 2024-02-12 DIAGNOSIS — G4734 Idiopathic sleep related nonobstructive alveolar hypoventilation: Secondary | ICD-10-CM | POA: Diagnosis not present

## 2024-02-14 ENCOUNTER — Ambulatory Visit

## 2024-02-14 VITALS — BP 134/60 | HR 62 | Temp 97.6°F | Ht 68.0 in | Wt 214.7 lb

## 2024-02-14 DIAGNOSIS — Z0000 Encounter for general adult medical examination without abnormal findings: Secondary | ICD-10-CM

## 2024-02-14 DIAGNOSIS — Z1211 Encounter for screening for malignant neoplasm of colon: Secondary | ICD-10-CM

## 2024-02-14 DIAGNOSIS — Z23 Encounter for immunization: Secondary | ICD-10-CM

## 2024-02-14 NOTE — Progress Notes (Signed)
 Chief Complaint  Patient presents with   Medicare Wellness     Subjective:   [name] is a 73 y.o. female who presents for a Medicare Annual Wellness Visit.  Visit info / Clinical Intake: Medicare Wellness Visit Type:: Subsequent Annual Wellness Visit Persons participating in visit and providing information:: patient Medicare Wellness Visit Mode:: In-person (required for WTM) Interpreter Needed?: No Pre-visit prep was completed: yes AWV questionnaire completed by patient prior to visit?: yes Date:: 02/10/24 Living arrangements:: (!) lives alone Patient's Overall Health Status Rating: good Typical amount of pain: none Does pain affect daily life?: no Are you currently prescribed opioids?: no  Dietary Habits and Nutritional Risks How many meals a day?: 3 Eats fruit and vegetables daily?: yes Most meals are obtained by: preparing own meals In the last 2 weeks, have you had any of the following?: none Diabetic:: no  Functional Status Activities of Daily Living (to include ambulation/medication): Independent Ambulation: Independent with device- listed below Home Assistive Devices/Equipment: Eyeglasses; Oxygen Medication Administration: Independent Home Management (perform basic housework or laundry): Independent Manage your own finances?: yes Primary transportation is: driving Concerns about vision?: no *vision screening is required for WTM* Concerns about hearing?: no  Fall Screening Falls in the past year?: 0 Number of falls in past year: 0 Was there an injury with Fall?: 0 Fall Risk Category Calculator: 0 Patient Fall Risk Level: Low Fall Risk  Fall Risk Patient at Risk for Falls Due to: No Fall Risks  Home and Transportation Safety: All rugs have non-skid backing?: yes All stairs or steps have railings?: N/A, no stairs Grab bars in the bathtub or shower?: (!) no Have non-skid surface in bathtub or shower?: yes Good home lighting?: yes Regular seat belt  use?: yes Hospital stays in the last year:: no  Cognitive Assessment Difficulty concentrating, remembering, or making decisions? : no Will 6CIT or Mini Cog be Completed: no 6CIT or Mini Cog Declined: patient alert, oriented, able to answer questions appropriately and recall recent events  Advance Directives (For Healthcare) Does Patient Have a Medical Advance Directive?: Yes Does patient want to make changes to medical advance directive?: No - Patient declined Type of Advance Directive: Healthcare Power of [place]; Living will Copy of Healthcare Power of Attorney in Chart?: Yes - validated most recent copy scanned in chart (See row information) Copy of Living Will in Chart?: Yes - validated most recent copy scanned in chart (See row information)  Reviewed/Updated  Reviewed/Updated: Reviewed All (Medical, Surgical, Family, Medications, Allergies, Care Teams, Patient Goals)    Allergies (verified) Hydrolyzed silk, Ciprofloxacin, Gabapentin , Gluten meal, and Metoprolol    Current Medications (verified) Outpatient Encounter Medications as of 02/14/2024  Medication Sig   acetaminophen  (TYLENOL ) 500 MG tablet Take 2 tablets (1,000 mg total) by mouth every 6 (six) hours as needed.   ALPRAZolam  (XANAX ) 0.5 MG tablet Take 1 tablet (0.5 mg total) by mouth at bedtime as needed for sleep (for use in the sleep lab during testing.).   apixaban  (ELIQUIS ) 5 MG TABS tablet TAKE 1 TABLET(5 MG) BY MOUTH TWICE DAILY   Cranberry 500 MG TABS Take 1,000 mg by mouth 2 (two) times daily.   Cyanocobalamin  (VITAMIN B 12) 500 MCG TABS Take 1 tablet by mouth daily.   cyclobenzaprine  (FLEXERIL ) 5 MG tablet Take 1 tablet (5 mg total) by mouth at bedtime as needed for muscle spasms.   diltiazem  (TIAZAC ) 240 MG 24 hr capsule TAKE 1 CAPSULE(240 MG) BY MOUTH DAILY   ibuprofen  (  ADVIL ) 200 MG tablet Take 400 mg by mouth every 6 (six) hours as needed (pain).   letrozole  (FEMARA ) 2.5 MG tablet Take 1 tablet (2.5 mg total)  by mouth daily.   OVER THE COUNTER MEDICATION Take 20 mg by mouth daily as needed (arthritis). CBD oil   polyethylene glycol (MIRALAX  / GLYCOLAX ) 17 g packet Take 17 g by mouth as needed (as directed).   sennosides-docusate sodium  (SENOKOT-S) 8.6-50 MG tablet Take 1 tablet by mouth as needed for constipation.   spironolactone  (ALDACTONE ) 50 MG tablet TAKE 2 TABLETS BY MOUTH EVERY MORNING AND 1 TABLET BY MOUTH EVERY EVENING   triamcinolone cream (KENALOG) 0.1 % Apply 1 application topically daily as needed (for dry skin).    Vibegron (GEMTESA) 75 MG TABS Take 75 mg by mouth daily.   vitamin C (ASCORBIC ACID) 500 MG tablet Take 500 mg by mouth 2 (two) times daily.   No facility-administered encounter medications on file as of 02/14/2024.    History: Past Medical History:  Diagnosis Date   Breast cancer (HCC)    Chronic edema    Clotting disorder    Constipation    Difficult intubation    pt states that her neck needs to be in a neutral position,  scoliosisand herniated dics in neck   Family history of colon cancer    Family history of uterine cancer    Gallstones    GERD (gastroesophageal reflux disease)    protonix , hx h. pylori   Herniated disc, cervical    History of hiatal hernia    History of sebaceous adenoma    History of uterine cancer 04/22/2014   sees Dr. 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 Tricia in dermatology right upper arm   Obesity    OSA on CPAP    uses CPAP   Paroxysmal atrial fibrillation (HCC)    Peripheral vascular disease    Personal history of radiation therapy    PONV (postoperative nausea and vomiting)    Recurrent UTI    S/P hip replacement    Scoliosis    Sleep apnea    Thyroid  nodule    Urinary incontinence    Uterine cancer (HCC)    Stage 1   Venous insufficiency    s/p ablation   Past Surgical History:  Procedure Laterality Date    ABDOMINAL HYSTERECTOMY  09/10/2013   ATRIAL FIBRILLATION ABLATION N/A 08/09/2019   Procedure: ATRIAL FIBRILLATION ABLATION;  Surgeon: Inocencio Soyla Lunger, MD;  Location: [facility];  Service: Cardiovascular;  Laterality: N/A;   BREAST LUMPECTOMY     BREAST LUMPECTOMY WITH RADIOACTIVE SEED AND SENTINEL LYMPH NODE BIOPSY Left 02/16/2021   Procedure: LEFT BREAST LUMPECTOMY WITH RADIOACTIVE SEED X2 AND SENTINEL LYMPH NODE BIOPSY;  Surgeon: Vanderbilt Ned, MD;  Location: [facility];  Service: General;  Laterality: Left;   BUNIONECTOMY  10/1976   CHOLECYSTECTOMY N/A 03/12/2019   Procedure: XI ROBOTIC ASSISTED CHOLECYSTECTOMY;  Surgeon: Signe Mitzie LABOR, MD;  Location: [facility];  Service: General;  Laterality: N/A;   COLONOSCOPY     COLONOSCOPY WITH PROPOFOL  N/A 01/17/2020   Procedure: COLONOSCOPY WITH PROPOFOL ;  Surgeon: Charlanne Groom, MD;  Location: [facility];  Service: Endoscopy;  Laterality: N/A;   DRUG INDUCED ENDOSCOPY N/A 03/04/2020   Procedure: DRUG INDUCED ENDOSCOPY;  Surgeon: Carlie Clark, MD;  Location: [facility];  Service: ENT;  Laterality: N/A;   FRACTURE SURGERY  09/1964   floor of orbit right side   HIATAL HERNIA REPAIR     IMPLANTATION OF HYPOGLOSSAL NERVE STIMULATOR Right 04/15/2020   Procedure: IMPLANTATION OF HYPOGLOSSAL NERVE STIMULATOR;  Surgeon: Carlie Clark, MD;  Location: [facility];  Service: ENT;  Laterality: Right;   JOINT REPLACEMENT     bilateral hip replacements   MELANOMA EXCISION  12/2011   POLYPECTOMY  01/17/2020   Procedure: POLYPECTOMY;  Surgeon: Charlanne Groom, MD;  Location: [facility];  Service: Endoscopy;;   TOTAL HIP ARTHROPLASTY Left 06/17/2014   Procedure: LEFT TOTAL HIP ARTHROPLASTY ANTERIOR APPROACH;  Surgeon: Lonni CINDERELLA Poli, MD;  Location: [facility];  Service: Orthopedics;  Laterality: Left;   TOTAL HIP ARTHROPLASTY Right 09/02/2014   Procedure: RIGHT TOTAL HIP ARTHROPLASTY ANTERIOR  APPROACH;  Surgeon: Lonni CINDERELLA Poli, MD;  Location: [facility];  Service: Orthopedics;  Laterality: Right;   VEIN SURGERY  05/2011   venous ablation    Family History  Problem Relation Age of Onset   Uterine cancer Mother    Emphysema Mother    Hypertension Father 65   Hyperlipidemia Father    Epilepsy Father 46   Colon cancer Father 51   Leukemia Father    Arthritis Sister 12   Diabetes Brother 65   Cancer Paternal Uncle        unk type   Cancer Maternal Grandmother        unk type possibly breast or skin   Other Paternal Grandmother        influenza    Stroke Paternal Grandfather    Skin cancer Daughter        SCC on back   Esophageal cancer Neg Hx    Rectal cancer Neg Hx    Stomach cancer Neg Hx    Sleep apnea Neg Hx    Social History   Occupational History   Occupation: lawyer/RETIRED  Tobacco Use   Smoking status: Never   Smokeless tobacco: Never  Vaping Use   Vaping status: Never Used  Substance and Sexual Activity   Alcohol  use: Not Currently    Comment: rarely at [facility]    Drug use: No   Sexual activity: Not Currently    Birth control/protection: Surgical   Tobacco Counseling Counseling given: No  SDOH Screenings   Food Insecurity: No Food Insecurity (02/14/2024)  Housing: Low Risk  (02/14/2024)  Transportation Needs: No Transportation Needs (02/14/2024)  Utilities: Not At Risk (02/14/2024)  Alcohol  Screen: Low Risk  (02/10/2024)  Depression (PHQ2-9): Low Risk  (02/14/2024)  Financial Resource Strain: Low Risk  (02/10/2024)  Physical Activity: Sufficiently Active (02/14/2024)  Social Connections: Moderately Integrated (02/14/2024)  Stress: No Stress Concern Present (02/14/2024)  Tobacco Use: Low Risk  (02/14/2024)  Health Literacy: Adequate Health Literacy (02/14/2024)   See flowsheets for full screening details  Depression Screen PHQ 2 & 9 Depression Scale- Over the past 2 weeks, how often have you been bothered by any of the following  problems? Little interest or pleasure in doing things: 0 Feeling down, depressed, or hopeless (PHQ Adolescent also includes...irritable): 0 PHQ-2 Total Score: 0     Goals Addressed               This Visit's Progress     Continue physical activity (pt-stated)        Remain active.             Objective:  Today's Vitals   02/14/24 0845  BP: 134/60  Pulse: 62  Temp: 97.6 F (36.4 C)  TempSrc: Oral  SpO2: 95%  Weight: 214 lb 11.2 oz (97.4 kg)  Height: 5' 8 (1.727 m)   Body mass index is 32.65 kg/m.  Hearing/Vision screen Hearing Screening - Comments:: Denies hearing difficulties   Vision Screening - Comments:: Wears rx glasses - up to date with routine eye exams with  [facility] Immunizations and Health Maintenance Health Maintenance  Topic Date Due   Zoster Vaccines- Shingrix (1 of 2) Never done   Colonoscopy  01/17/2023   DTaP/Tdap/Td (2 - Tdap) 10/13/2023   COVID-19 Vaccine (4 - 2025-26 season) 11/13/2023   Mammogram  01/02/2025   Medicare Annual Wellness (AWV)  02/13/2025   Bone Density Scan  08/07/2025   Pneumococcal Vaccine: 50+ Years  Completed   Influenza Vaccine  Completed   Hepatitis C Screening  Completed   Meningococcal B Vaccine  Aged Out        Assessment/Plan:  This is a routine wellness examination for Rhayne.  Patient Care Team: Theophilus Andrews, Tully GRADE, MD as PCP - General (Internal Medicine) Inocencio Soyla Lunger, MD as PCP - Electrophysiology (Cardiology) Mona Vinie BROCKS, MD as Consulting Physician (Cardiology) Gorge Ade, MD as Consulting Physician (Obstetrics and Gynecology) Tricia, Tawni CROME, MD as Referring Physician (Dermatology) Cam Morene ORN, MD as Attending Physician (Urology) Odean Potts, MD as Consulting Physician (Hematology and Oncology) Izell Domino, MD as Attending Physician (Radiation Oncology) Vanderbilt Ned, MD as Consulting Physician (General Surgery)  I have personally reviewed  and noted the following in the patient's chart:   Medical and social history Use of alcohol , tobacco or illicit drugs  Current medications and supplements including opioid prescriptions. Functional ability and status Nutritional status Physical activity Advanced directives List of other physicians Hospitalizations, surgeries, and ER visits in previous 12 months Vitals Screenings to include cognitive, depression, and falls Referrals and appointments  Orders Placed This Encounter  Procedures   Flu vaccine HIGH DOSE PF(Fluzone Trivalent)   Ambulatory referral to Gastroenterology    Referral Priority:   Routine    Referral Type:   Consultation    Referral Reason:   Specialty Services Required    Number of Visits Requested:   1   In addition, I have reviewed and discussed with patient certain preventive protocols, quality metrics, and best practice recommendations. A written personalized care plan for preventive services as well as general preventive health recommendations were provided to patient.   Rojelio ORN Blush, LPN   [id number]   Return in 1 year on 02/19/25  After Visit Summary: (In Person-Printed) AVS printed and given to the patient  Nurse Notes: None

## 2024-02-14 NOTE — Patient Instructions (Addendum)
 [name],  Thank you for taking the time for your Medicare Wellness Visit. I appreciate your continued commitment to your health goals. Please review the care plan we discussed, and feel free to reach out if I can assist you further.  Please note that Annual Wellness Visits do not include a physical exam. Some assessments may be limited, especially if the visit was conducted virtually. If needed, we may recommend an in-person follow-up with your provider.  Ongoing Care Seeing your primary care provider every 3 to 6 months helps us  monitor your health and provide consistent, personalized care.   Referrals If a referral was made during today's visit and you haven't received any updates within two weeks, please contact the referred provider directly to check on the status.  Recommended Screenings:  Health Maintenance  Topic Date Due   Zoster (Shingles) Vaccine (1 of 2) Never done   Colon Cancer Screening  01/17/2023   DTaP/Tdap/Td vaccine (2 - Tdap) 10/13/2023   COVID-19 Vaccine (4 - 2025-26 season) 11/13/2023   Breast Cancer Screening  01/02/2025   Medicare Annual Wellness Visit  02/13/2025   Osteoporosis screening with Bone Density Scan  08/07/2025   Pneumococcal Vaccine for age over 42  Completed   Flu Shot  Completed   Hepatitis C Screening  Completed   Meningitis B Vaccine  Aged Out       02/14/2024    8:53 AM  Advanced Directives  Does Patient Have a Medical Advance Directive? Yes  Type of Estate Agent of [place];Living will  Does patient want to make changes to medical advance directive? No - Patient declined  Copy of Healthcare Power of Attorney in Chart? Yes - validated most recent copy scanned in chart (See row information)    Vision: Annual vision screenings are recommended for early detection of glaucoma, cataracts, and diabetic retinopathy. These exams can also reveal signs of chronic conditions such as diabetes and high blood  pressure.  Dental: Annual dental screenings help detect early signs of oral cancer, gum disease, and other conditions linked to overall health, including heart disease and diabetes.  Please see the attached documents for additional preventive care recommendations.

## 2024-02-16 DIAGNOSIS — N3020 Other chronic cystitis without hematuria: Secondary | ICD-10-CM | POA: Diagnosis not present

## 2024-02-16 DIAGNOSIS — M79641 Pain in right hand: Secondary | ICD-10-CM | POA: Diagnosis not present

## 2024-02-16 DIAGNOSIS — N3281 Overactive bladder: Secondary | ICD-10-CM | POA: Diagnosis not present

## 2024-03-13 ENCOUNTER — Other Ambulatory Visit: Payer: Self-pay | Admitting: Student

## 2024-03-19 ENCOUNTER — Other Ambulatory Visit: Payer: Self-pay

## 2024-03-19 ENCOUNTER — Other Ambulatory Visit (INDEPENDENT_AMBULATORY_CARE_PROVIDER_SITE_OTHER): Payer: Self-pay

## 2024-03-19 ENCOUNTER — Encounter: Payer: Self-pay | Admitting: Sports Medicine

## 2024-03-19 ENCOUNTER — Ambulatory Visit: Admitting: Sports Medicine

## 2024-03-19 DIAGNOSIS — M25511 Pain in right shoulder: Secondary | ICD-10-CM

## 2024-03-19 DIAGNOSIS — M75101 Unspecified rotator cuff tear or rupture of right shoulder, not specified as traumatic: Secondary | ICD-10-CM

## 2024-03-19 DIAGNOSIS — M19011 Primary osteoarthritis, right shoulder: Secondary | ICD-10-CM | POA: Diagnosis not present

## 2024-03-19 DIAGNOSIS — S46811D Strain of other muscles, fascia and tendons at shoulder and upper arm level, right arm, subsequent encounter: Secondary | ICD-10-CM | POA: Diagnosis not present

## 2024-03-19 DIAGNOSIS — G8929 Other chronic pain: Secondary | ICD-10-CM

## 2024-03-19 NOTE — Assessment & Plan Note (Signed)
 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 Patient lost about 20 pounds by eating healthy.  This has significantly improved her quality of life as well as the hot flashes.   Breast cancer surveillance: Breast exam 03/19/2024: Benign Mammogram 01/03/2024: Benign breast density category B   Return to clinic in 1 year for follow-up

## 2024-03-19 NOTE — Progress Notes (Unsigned)
 Patient presents with right shoulder pain due to a fall on December 5th, 2025. She was previously seen for right shoulder pain and was doing well until her fall. Patient states having very limited ROM and is only able to move her shoulder passively. She states that sudden movements makes the pain worse and has only felt a 10/10 pain level once. She wore a sling for a while but it brought no relief. Patient states a clunk and popping when she is moving her shoulder. She uses both ice and heat, heat has brought more comforting relief. She takes Ibuprofen  as needed for relief.

## 2024-03-19 NOTE — Progress Notes (Signed)
 "  [name] - 74 y.o. female MRN [id number]  Date of birth: March 28, 1950  Office Visit Note: Visit Date: 03/19/2024 PCP: Theophilus Andrews, Tully GRADE, MD Referred by: Theophilus Andrews, Tully GRADE, MD  Medical Resident, Sports Medicine Fellow - Attending Physician Addendum:   I have independently interviewed and examined the patient myself. I have discussed the above with the original author and agree with their documentation. My edits for correction/addition/clarification have been made, see any changes above and below.   In summary, pleasant 74 year old female with new right shoulder injury x 1 month after fall.  Has a history of known full-thickness supraspinatus tear which was treated conservatively and had been doing quite well until more recent injury.  Repeat diagnostic ultrasound today shows full-thickness infraspinatus tear which correlates to her exam with significant weakness and pain with resisted external rotation to degree flexion of the shoulder.  Had a discussion regarding conservative versus surgical treatment options.  She has not been any sort of physical therapy, I would like to get her started in formal PT to see what sort of progress she can make in terms of stabilizing the remainder of the rotator cuff and function about the shoulder.  I will see her back in about 6 weeks after starting PT and reevaluate.  At that point if making progress would likely add nitroglycerin patch to help augment blood flow and tendon healing.  If she still having notable weakness and/or pain, we could consider referral to Dr. Addie for surgical evaluation, likely would desire MRI for mapping at that time. Ok for OTC anti-inflammatories as needed.  Lonell Sprang, DO Primary Care Sports Medicine Physician  [facility] - [facility]   Subjective: Chief Complaint  Patient presents with   Right Shoulder - Pain   HPI: BEYONCA [name] is a pleasant 74 y.o. female who presents today for follow-up  regarding right shoulder pain.  Patient was last seen by Dr. Sprang on 01/25/2023 at which time she was diagnosed with tear of right supraspinatus tennis and partial tear of right infraspinatus tendon.  She also had chronic shoulder pain from presumed osteoarthritis.  Presents today for follow-up after experiencing a fall 1 month ago where she tripped over a drain and landed on her right arm.  In terms of her right shoulder, patient endorses severe pain that has gradually improved over the last week or 2.  Has new functional deficits and trouble completing many ADLs including cooking, reaching overhead, and activities that require external rotation such as reaching behind her.  Patient has been managing pain with ice and ibuprofen .  Here today for further evaluation, requesting repeat ultrasound.   Pertinent ROS were reviewed with the patient and found to be negative unless otherwise specified above in HPI.   Assessment & Plan: Visit Diagnoses:  1. Chronic right shoulder pain   2. Tear of right supraspinatus tendon   3. Tear of right infraspinatus tendon, subsequent encounter   4. Primary glenohumeral osteoarthritis, right shoulder    Plan: Physical exam and ultrasonic evaluation reveals evidence of complete tear of right infraspinatus with presence of profound external rotation weakness in addition to previously torn supraspinatus.  Patient also has mild/moderate glenohumeral osteoarthritis as evidenced on ultrasound and plain films taken from today.  Patient has persistent functional deficits that have been present since fall 1 month ago.  Would like to avoid surgery if possible, but open to all options in order to achieve improvement of functionality.  Unable to  cook and perform many ADLs at this time.  We will start patient with 6 weeks of dedicated physical therapy to see how much strength and functionality she can regain.  Will abstain from any kind of cortisone injection today as to not weaken any  remaining fibers of her rotator cuff still intact.  Patient has been independently taking ibuprofen  intermittently for pain relief however patient should avoid NSAIDs when possible given concurrent Eliquis  prescription.  Take Tylenol  as needed.  Will follow-up in 6 weeks for reevaluation.  Will consider surgical consult with Dr. Addie versus continued rehab +/- nitroglycerin patches depending on progress made over next month.  Patient understands and agrees to treatment plan.  No further questions or concerns at this time.  Follow-up: Return in about 7 weeks (around 05/07/2024) for R-shoulder .   Meds & Orders: No orders of the defined types were placed in this encounter.   Orders Placed This Encounter  Procedures   XR Shoulder Right   US  Extrem Up Right Ltd   Ambulatory referral to Physical Therapy     Procedures: No procedures performed      Clinical History: No specialty comments available.  She reports that she has never smoked. She has never used smokeless tobacco. No results for input(s): HGBA1C, LABURIC in the last 8760 hours.  Objective:   Vital Signs: There were no vitals taken for this visit.  Physical Exam  Gen: Well-appearing, in no acute distress; non-toxic CV: Well-perfused. Warm.  Resp: Breathing unlabored on room air; no wheezing. Psych: Fluid speech in conversation; appropriate affect; normal thought process  Ortho Exam - On inspection of left shoulder no evidence of erythema, ecchymosis, or edema present.  Denies any tenderness to palpation over bony landmarks of shoulder including but not limited to [facility] joint, acromion, and bicipital groove of humerus.  Patient has limited active range of motion of shoulder in flexion, abduction and external rotation.  The patient does have reproduction of pain with empty can testing and resisted abduction.  Rotator cuff strength testing compromised in abduction and external rotation.  Neurovascularly intact distally with no  radiation of pain into forearms/hands.  Empty can testing borderline positive.  O'Brien's indeterminant.  Hawkins negative.  Neer's negative.  Apprehension test negative.     Imaging: US  Extrem Up Right Ltd Result Date: 03/20/2024 [facility] US  of Shoulder, right Patient was seated on exam table and shoulder US  examination was performed using high frequency linear probe. The biceps tendon was visualized within the bicipital groove in both longitudinal and transverse axis with no signs of fluid or hypoechoic changes. Tendon fibers intact without signs of irregularity. The subscapularis tendon was visualized in both longitudinal and transverse axis with intact tendon inserting at the inferior lesser tubercle of the humerus. No signs of tear, no hypoechoic changes and minimal tissue irregularity were seen. The supraspinatus tendon was visualized in longitudinal, transverse, and dynamic views.  Full-thickness tear of supraspinatus tendon with 0.85 cm retraction measured. The infraspinatus and teres minor tendons were visualized in both longitudinal and transverse axis with tendon insertion at the middle facet of the great tubercle of the humerus with evidence of full-thickness infraspinatus tearing with 1.1 cm retraction.  The posterior glenohumeral joint was visualized with proper alignment with mild reactive joint effusion present.   Overall, full-thickness tears of the (chronic) supraspinatus and (acute) infraspinatus tendons with mild/moderate glenohumeral osteoarthritis.   XR Shoulder Right Result Date: 03/20/2024 4 views of right shoulder x-rays obtained including AP, Grashey, axillary,  and Scap Y view.  Proper alignment of glenohumeral joint with no evidence of acute fractures or acute other osseous abnormalities.  Patient does have evidence of mild/moderate glenohumeral and acromioclavicular osteoarthritis.  There is mild calcification superior to the humeral head, possibly indicative of  tearing/pathology.   Past Medical/Family/Surgical/Social History: Medications & Allergies reviewed per EMR, new medications updated. Patient Active Problem List   Diagnosis Date Noted   Chronic intermittent hypoxia with obstructive sleep apnea 01/26/2024   Sleep-related hypoxia 11/08/2023   Psychophysiological insomnia 07/14/2023   Weight gain 07/14/2023   S/P placement of hypoglossal nerve stimulator 01/16/2023   Macrocytosis 12/28/2021   Malignant neoplasm of upper-outer quadrant of left breast in female, estrogen receptor positive (HCC) 01/07/2021   Vitamin D  deficiency 12/01/2020   Intolerance of continuous positive airway pressure (CPAP) ventilation 05/13/2020   Severe obstructive sleep apnea-hypopnea syndrome 05/13/2020   Hx of colonic polyps    Adenomatous polyp of ascending colon    Genetic testing 06/24/2019   Family history of uterine cancer    Family history of colon cancer    History of uterine cancer    History of sebaceous adenoma    S/P repair of paraesophageal hernia 03/12/2019   Syncope 02/27/2018   Community acquired pneumonia 02/10/2018   CAP (community acquired pneumonia) 02/10/2018   Long term current use of antiarrhythmic drug 11/02/2015   Varicose veins 11/02/2015   OAB (overactive bladder) 09/07/2015   History of melanoma 06/10/2015   Thyroid  nodule 06/10/2015   GERD (gastroesophageal reflux disease) 05/25/2015   Chronic venous insufficiency 05/25/2015   Hx of essential hypertension 05/25/2015   Atrial fibrillation (HCC) 04/22/2014   Osteoarthritis, hip, bilateral 03/25/2014   Lumbar and sacral osteoarthritis 12/10/2013   Past Medical History:  Diagnosis Date   Breast cancer (HCC)    Chronic edema    Clotting disorder    Constipation    Difficult intubation    pt states that her neck needs to be in a neutral position,  scoliosisand herniated dics in neck   Family history of colon cancer    Family history of uterine cancer    Gallstones     GERD (gastroesophageal reflux disease)    protonix , hx h. pylori   Herniated disc, cervical    History of hiatal hernia    History of sebaceous adenoma    History of uterine cancer 04/22/2014   sees Dr. 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 Tricia in dermatology right upper arm   Obesity    OSA on CPAP    uses CPAP   Paroxysmal atrial fibrillation (HCC)    Peripheral vascular disease    Personal history of radiation therapy    PONV (postoperative nausea and vomiting)    Recurrent UTI    S/P hip replacement    Scoliosis    Sleep apnea    Thyroid  nodule    Urinary incontinence    Uterine cancer (HCC)    Stage 1   Venous insufficiency    s/p ablation   Family History  Problem Relation Age of Onset   Uterine cancer Mother    Emphysema Mother    Hypertension Father 35   Hyperlipidemia Father    Epilepsy Father 45   Colon cancer Father 23   Leukemia Father    Arthritis Sister 14   Diabetes Brother 60   Cancer Paternal  Uncle        unk type   Cancer Maternal Grandmother        unk type possibly breast or skin   Other Paternal Grandmother        influenza    Stroke Paternal Grandfather    Skin cancer Daughter        SCC on back   Esophageal cancer Neg Hx    Rectal cancer Neg Hx    Stomach cancer Neg Hx    Sleep apnea Neg Hx    Past Surgical History:  Procedure Laterality Date   ABDOMINAL HYSTERECTOMY  09/10/2013   ATRIAL FIBRILLATION ABLATION N/A 08/09/2019   Procedure: ATRIAL FIBRILLATION ABLATION;  Surgeon: Inocencio Soyla Lunger, MD;  Location: [facility];  Service: Cardiovascular;  Laterality: N/A;   BREAST LUMPECTOMY     BREAST LUMPECTOMY WITH RADIOACTIVE SEED AND SENTINEL LYMPH NODE BIOPSY Left 02/16/2021   Procedure: LEFT BREAST LUMPECTOMY WITH RADIOACTIVE SEED X2 AND SENTINEL LYMPH NODE BIOPSY;  Surgeon: Vanderbilt Ned, MD;  Location: [facility];  Service: General;  Laterality: Left;   BUNIONECTOMY  10/1976   CHOLECYSTECTOMY N/A 03/12/2019   Procedure: XI ROBOTIC ASSISTED CHOLECYSTECTOMY;  Surgeon: Signe Mitzie LABOR, MD;  Location: [facility];  Service: General;  Laterality: N/A;   COLONOSCOPY     COLONOSCOPY WITH PROPOFOL  N/A 01/17/2020   Procedure: COLONOSCOPY WITH PROPOFOL ;  Surgeon: Charlanne Groom, MD;  Location: [facility];  Service: Endoscopy;  Laterality: N/A;   DRUG INDUCED ENDOSCOPY N/A 03/04/2020   Procedure: DRUG INDUCED ENDOSCOPY;  Surgeon: Carlie Clark, MD;  Location: [facility];  Service: ENT;  Laterality: N/A;   FRACTURE SURGERY  09/1964   floor of orbit right side   HIATAL HERNIA REPAIR     IMPLANTATION OF HYPOGLOSSAL NERVE STIMULATOR Right 04/15/2020   Procedure: IMPLANTATION OF HYPOGLOSSAL NERVE STIMULATOR;  Surgeon: Carlie Clark, MD;  Location: [facility];  Service: ENT;  Laterality: Right;   JOINT REPLACEMENT     bilateral hip replacements   MELANOMA EXCISION  12/2011   POLYPECTOMY  01/17/2020   Procedure: POLYPECTOMY;  Surgeon: Charlanne Groom, MD;  Location: [facility];  Service: Endoscopy;;   TOTAL HIP ARTHROPLASTY Left 06/17/2014   Procedure: LEFT TOTAL HIP ARTHROPLASTY ANTERIOR APPROACH;  Surgeon: Lonni CINDERELLA Poli, MD;  Location: [facility];  Service: Orthopedics;  Laterality: Left;   TOTAL HIP ARTHROPLASTY Right 09/02/2014   Procedure: RIGHT TOTAL HIP ARTHROPLASTY ANTERIOR APPROACH;  Surgeon: Lonni CINDERELLA Poli, MD;  Location: [facility];  Service: Orthopedics;  Laterality: Right;   VEIN SURGERY  05/2011   venous ablation    Social History   Occupational History   Occupation: lawyer/RETIRED  Tobacco Use   Smoking status: Never   Smokeless tobacco: Never  Vaping Use   Vaping status: Never Used  Substance and Sexual Activity   Alcohol  use: Not Currently    Comment: rarely at [facility]    Drug use: No   Sexual activity: Not Currently    Birth  control/protection: Surgical   I spent 36 minutes in the care of the patient today including face-to-face time, preparation to see the patient, as well as review of previous diagnostic imaging, updated imaging in comparison given new injury, discussion regarding surgical and nonsurgical treatment options therapy and OTC pharmacologic treatment options, possible surgery that could be performed by one of my orthopedic colleagues for the above diagnoses.   Lonell Sprang, DO Primary Care Sports Medicine Physician  [facility]  This note was dictated using Dragon naturally speaking software and may contain errors in syntax, spelling, or content which have not been identified prior to signing this note.   "

## 2024-03-20 ENCOUNTER — Inpatient Hospital Stay: Payer: PPO | Attending: Hematology and Oncology | Admitting: Hematology and Oncology

## 2024-03-20 VITALS — BP 138/72 | HR 79 | Temp 98.7°F | Resp 16 | Ht 68.0 in | Wt 211.8 lb

## 2024-03-20 DIAGNOSIS — Z1721 Progesterone receptor positive status: Secondary | ICD-10-CM | POA: Diagnosis not present

## 2024-03-20 DIAGNOSIS — Z8542 Personal history of malignant neoplasm of other parts of uterus: Secondary | ICD-10-CM | POA: Diagnosis not present

## 2024-03-20 DIAGNOSIS — Z923 Personal history of irradiation: Secondary | ICD-10-CM | POA: Insufficient documentation

## 2024-03-20 DIAGNOSIS — Z79811 Long term (current) use of aromatase inhibitors: Secondary | ICD-10-CM | POA: Diagnosis not present

## 2024-03-20 DIAGNOSIS — Z7901 Long term (current) use of anticoagulants: Secondary | ICD-10-CM | POA: Insufficient documentation

## 2024-03-20 DIAGNOSIS — Z79899 Other long term (current) drug therapy: Secondary | ICD-10-CM | POA: Diagnosis not present

## 2024-03-20 DIAGNOSIS — Z90710 Acquired absence of both cervix and uterus: Secondary | ICD-10-CM | POA: Insufficient documentation

## 2024-03-20 DIAGNOSIS — C50412 Malignant neoplasm of upper-outer quadrant of left female breast: Secondary | ICD-10-CM | POA: Diagnosis not present

## 2024-03-20 DIAGNOSIS — Z170 Estrogen receptor positive status [ER+]: Secondary | ICD-10-CM | POA: Diagnosis not present

## 2024-03-20 DIAGNOSIS — Z85820 Personal history of malignant melanoma of skin: Secondary | ICD-10-CM | POA: Diagnosis not present

## 2024-03-20 DIAGNOSIS — Z1732 Human epidermal growth factor receptor 2 negative status: Secondary | ICD-10-CM | POA: Insufficient documentation

## 2024-03-20 MED ORDER — LETROZOLE 2.5 MG PO TABS: 2.5000 mg | ORAL_TABLET | Freq: Every day | ORAL | 3 refills | Status: AC

## 2024-03-20 NOTE — Progress Notes (Signed)
 "  Patient Care Team: Theophilus Andrews, Tully GRADE, MD as PCP - General (Internal Medicine) Inocencio Soyla Lunger, MD as PCP - Electrophysiology (Cardiology) Mona Vinie BROCKS, MD as Consulting Physician (Cardiology) Gorge Ade, MD (Inactive) as Consulting Physician (Obstetrics and Gynecology) Tricia, Tawni CROME, MD as Referring Physician (Dermatology) Cam Morene ORN, MD as Attending Physician (Urology) Odean Potts, MD as Consulting Physician (Hematology and Oncology) Izell Domino, MD as Attending Physician (Radiation Oncology) Vanderbilt Ned, MD as Consulting Physician (General Surgery)  DIAGNOSIS:  Encounter Diagnosis  Name Primary?   Malignant neoplasm of upper-outer quadrant of left breast in female, estrogen receptor positive (HCC) Yes    SUMMARY OF ONCOLOGIC HISTORY: Oncology History  Malignant neoplasm of upper-outer quadrant of left breast in female, estrogen receptor positive (HCC)  06/02/2019 Genetic Testing   Negative. Genes Tested include: APC*, ATM*, AXIN2, BAP1, BARD1, BMPR1A, BRCA1, BRCA2, BRIP1, CDH1, CDK4, CDKN2A (p14ARF), CDKN2A (p16INK4a), CHEK2, CTNNA1, DICER1*, EPCAM*, GREM1*, HOXB13, KIT, MEN1*, MITF*, MLH1*, MSH2*, MSH3*, MSH6*, MUTYH, NBN, NF1*, NTHL1, PALB2, PDGFRA, PMS2*, POLD1*, POLE, POT1, PTEN*, RAD50, RAD51C, RAD51D, RB1*, RNF43, SDHA*, SDHB, SDHC*, SDHD, SMAD4, SMARCA4, STK11, TP53, TSC1*, TSC2, VHL.   12/29/2020 Initial Diagnosis   Screening mammogram: 2 possible areas of distortion in the left breast 1o clock 11 O Clock. Diagnostic mammogram and US : large suspicious area of distortion within the superior left breast.  Both biopsies: Grade 2 IDC ER 95%, PR 95%, Ki-67 5%, HER2 equivocal by IHC, FISH negative ratio 1.08 and copy #1.95   02/16/2021 Surgery   02/16/2021: Left lumpectomy: IDC with DCIS 4.6 cm, grade 2, margins negative, 1/2 lymph nodes positive, ER 95%, PR 100%, HER2 equivocal by IHC FISH ratio 1.18: Negative, Ki-67 2%   02/16/2021  Miscellaneous   Mammaprint: low risk indicating no chemotherapy benefit   04/12/2021 - 05/21/2021 Radiation Therapy   Site Technique Total Dose (Gy) Dose per Fx (Gy) Completed Fx Beam Energies  Breast, Left: Breast_L 3D 50/50 2 25/25 10X  Breast, Left: Breast_L_SCV_PAB 3D 50/50 2 25/25 10X  Breast, Left: Breast_L_Bst 3D 10/10 2 5/5 6X, 10X     06/2021 -  Anti-estrogen oral therapy   Letrozole  daily     CHIEF COMPLIANT: Follow-up on letrozole  therapy  HISTORY OF PRESENT ILLNESS:  History of Present Illness [name] is a 74 year old female with 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 She completed left lumpectomy with sentinel lymph node biopsy and adjuvant radiation (50 Gy in 25 fractions plus 10 Gy boost) and remains on letrozole . Hot flashes are stable and have improved with recent weight loss, and she continues to work on further weight reduction. She needs a letrozole  refill and continues annual breast cancer surveillance.  She has multiple prior malignancies including two cutaneous melanomas (grade 1 stage I in 2012 and grade 0 in situ a few years ago), both excised with negative margins and no recurrence. 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 She underwent hysterectomy for uterine cancer and polypectomy for a precancerous colonic polyp, with a repeat colonoscopy planned. Genetic testing for hereditary cancer syndromes was negative despite her extensive personal cancer history and no cancer in siblings.  She recently fell and injured her left shoulder with  a new infraspinatus tendon tear and prior paraspinatus tear,  which limits overhead reach. She is in physical therapy and prefers to avoid surgery.      ALLERGIES:  is allergic to hydrolyzed silk, ciprofloxacin, gabapentin , gluten meal, and metoprolol .  MEDICATIONS:  Current Outpatient Medications  Medication Sig Dispense Refill   acetaminophen  (TYLENOL ) 500 MG tablet Take 2 tablets (1,000 mg total) by mouth every 6 (six) hours as needed.  0   ALPRAZolam  (XANAX ) 0.5 MG tablet Take 1 tablet (0.5 mg total) by mouth at bedtime as needed for sleep (for use in the sleep lab during testing.). 3 tablet 0   apixaban  (ELIQUIS ) 5 MG TABS tablet TAKE 1 TABLET(5 MG) BY MOUTH TWICE DAILY 60 tablet 0   Cranberry 500 MG TABS Take 1,000 mg by mouth 2 (two) times daily.     Cyanocobalamin  (VITAMIN B 12) 500 MCG TABS Take 1 tablet by mouth daily.     cyclobenzaprine  (FLEXERIL ) 5 MG tablet Take 1 tablet (5 mg total) by mouth at bedtime as needed for muscle spasms. 30 tablet 1   diltiazem  (TIAZAC ) 240 MG 24 hr capsule TAKE 1 CAPSULE(240 MG) BY MOUTH DAILY 90 capsule 3   ibuprofen  (ADVIL ) 200 MG tablet Take 400 mg by mouth every 6 (six) hours as needed (pain).     letrozole  (FEMARA ) 2.5 MG tablet Take 1 tablet (2.5 mg total) by mouth daily. 90 tablet 3   nitrofurantoin , macrocrystal-monohydrate, (MACROBID ) 100 MG capsule Take 100 mg by mouth 2 (two) times daily.     OVER THE COUNTER MEDICATION Take 20 mg by mouth daily as needed (arthritis). CBD oil     polyethylene glycol (MIRALAX  / GLYCOLAX ) 17 g packet Take 17 g by mouth as needed (as directed).     sennosides-docusate sodium  (SENOKOT-S) 8.6-50 MG tablet Take 1 tablet by mouth as needed for constipation.     spironolactone  (ALDACTONE ) 50 MG tablet TAKE 2 TABLETS BY MOUTH EVERY MORNING AND 1 TABLET BY MOUTH EVERY EVENING 270 tablet 2   triamcinolone cream (KENALOG) 0.1 % Apply 1 application topically daily as needed (for dry skin).   1   Vibegron (GEMTESA) 75 MG TABS Take 75 mg by mouth daily.     vitamin C (ASCORBIC  ACID) 500 MG tablet Take 500 mg by mouth 2 (two) times daily.     No current facility-administered medications for this visit.    PHYSICAL EXAMINATION: ECOG PERFORMANCE STATUS: 1 - Symptomatic but completely ambulatory  Vitals:   03/20/24 0853  BP: 138/72  Pulse: 79  Resp: 16  Temp: 98.7 F (37.1 C)  SpO2: 98%   Filed Weights   03/20/24 0853  Weight: 211 lb 12.8 oz (96.1 kg)    Physical Exam Breast exam: Benign, scar tissue from prior surgeries  (exam performed in the presence of a chaperone)  LABORATORY DATA:  I have reviewed the data as listed    Latest Ref Rng & Units 12/25/2023    9:58 AM 01/03/2023   11:07 AM 11/04/2022    8:43 AM  CMP  Glucose 70 - 99 mg/dL 82  92  897   BUN 8 - 27 mg/dL 14  19  16    Creatinine 0.57 - 1.00 mg/dL 9.24  9.20  9.15   Sodium 134 - 144 mmol/L 138  133  137   Potassium 3.5 - 5.2 mmol/L 4.6  4.8  5.0   Chloride 96 - 106 mmol/L 101  98  99   CO2  20 - 29 mmol/L 22  27  24    Calcium  8.7 - 10.3 mg/dL 9.7  9.7  9.6   Total Protein 6.0 - 8.3 g/dL  6.9    Total Bilirubin 0.2 - 1.2 mg/dL  0.7    Alkaline Phos 39 - 117 U/L  78    AST 0 - 37 U/L  22    ALT 0 - 35 U/L  26      Lab Results  Component Value Date   WBC 4.7 12/25/2023   HGB 14.7 12/25/2023   HCT 44.5 12/25/2023   MCV 108 (H) 12/25/2023   PLT 194 12/25/2023   NEUTROABS 2.4 01/03/2023    ASSESSMENT & PLAN:  Malignant neoplasm of upper-outer quadrant of left breast in female, estrogen receptor positive (HCC) 02/16/2021: Left lumpectomy: IDC with DCIS 4.6 cm, grade 2, margins negative, 1/2 lymph nodes positive, ER 95%, PR 100%, HER2 equivocal by IHC FISH ratio 1.18: Negative, Ki-67 2% MammaPrint: Low risk Adjuvant radiation 04/13/2021 05/21/2021    Treatment plan: Adjuvant antiestrogen therapy with letrozole  2.5 mg daily x 7 years   Letrozole  toxicities: Hot flashes: Marked improvement since when she started the medication. Patient lost about 20 pounds by eating healthy.   This has significantly improved her quality of life as well as the hot flashes.   Breast cancer surveillance: Breast exam 03/19/2024: Benign Mammogram 01/03/2024: Benign breast density category B   Return to clinic in 1 year for follow-up  No orders of the defined types were placed in this encounter.  The patient has a good understanding of the overall plan. she agrees with it. she will call with any problems that may develop before the next visit here.  I personally spent a total of 30 minutes in the care of the patient today including preparing to see the patient, getting/reviewing separately obtained history, performing a medically appropriate exam/evaluation, counseling and educating, placing orders, referring and communicating with other health care professionals, documenting clinical information in the EHR, independently interpreting results, communicating results, and coordinating care.   Viinay K Sabiha Sura, MD 03/20/2024    "

## 2024-03-25 NOTE — Therapy (Signed)
 " OUTPATIENT PHYSICAL THERAPY UPPER EXTREMITY EVALUATION   Patient Name: [name] MRN: [id number] DOB:14-Jan-1951, 74 y.o., female Today's Date: 03/26/2024  END OF SESSION:  PT End of Session - 03/26/24 0851     Visit Number 1    Number of Visits 8    Date for Recertification  05/21/24    Authorization Type [employer] $15 COPAY    PT Start Time 0851    PT Stop Time 0935    PT Time Calculation (min) 44 min    Activity Tolerance Patient tolerated treatment well    Behavior During Therapy WFL for tasks assessed/performed          Past Medical History:  Diagnosis Date   Breast cancer (HCC)    Chronic edema    Clotting disorder    Constipation    Difficult intubation    pt states that her neck needs to be in a neutral position,  scoliosisand herniated dics in neck   Family history of colon cancer    Family history of uterine cancer    Gallstones    GERD (gastroesophageal reflux disease)    protonix , hx h. pylori   Herniated disc, cervical    History of hiatal hernia    History of sebaceous adenoma    History of uterine cancer 04/22/2014   sees Dr. 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 Tricia in dermatology right upper arm   Obesity    OSA on CPAP    uses CPAP   Paroxysmal atrial fibrillation (HCC)    Peripheral vascular disease    Personal history of radiation therapy    PONV (postoperative nausea and vomiting)    Recurrent UTI    S/P hip replacement    Scoliosis    Sleep apnea    Thyroid  nodule    Urinary incontinence    Uterine cancer (HCC)    Stage 1   Venous insufficiency    s/p ablation   Past Surgical History:  Procedure Laterality Date   ABDOMINAL HYSTERECTOMY  09/10/2013   ATRIAL FIBRILLATION ABLATION N/A 08/09/2019   Procedure: ATRIAL FIBRILLATION ABLATION;  Surgeon: Inocencio Soyla Lunger, MD;  Location: [facility];  Service:  Cardiovascular;  Laterality: N/A;   BREAST LUMPECTOMY     BREAST LUMPECTOMY WITH RADIOACTIVE SEED AND SENTINEL LYMPH NODE BIOPSY Left 02/16/2021   Procedure: LEFT BREAST LUMPECTOMY WITH RADIOACTIVE SEED X2 AND SENTINEL LYMPH NODE BIOPSY;  Surgeon: Vanderbilt Ned, MD;  Location: [facility];  Service: General;  Laterality: Left;   BUNIONECTOMY  10/1976   CHOLECYSTECTOMY N/A 03/12/2019   Procedure: XI ROBOTIC ASSISTED CHOLECYSTECTOMY;  Surgeon: Signe Mitzie LABOR, MD;  Location: [facility];  Service: General;  Laterality: N/A;   COLONOSCOPY     COLONOSCOPY WITH PROPOFOL  N/A 01/17/2020   Procedure: COLONOSCOPY WITH PROPOFOL ;  Surgeon: Charlanne Groom, MD;  Location: [facility];  Service: Endoscopy;  Laterality: N/A;   DRUG INDUCED ENDOSCOPY N/A 03/04/2020   Procedure: DRUG INDUCED ENDOSCOPY;  Surgeon: Carlie Clark, MD;  Location: [facility];  Service: ENT;  Laterality: N/A;   FRACTURE SURGERY  09/1964   floor of orbit right side   HIATAL HERNIA REPAIR     IMPLANTATION OF HYPOGLOSSAL NERVE STIMULATOR Right 04/15/2020   Procedure: IMPLANTATION OF HYPOGLOSSAL NERVE STIMULATOR;  Surgeon: Carlie Clark, MD;  Location: [facility];  Service: ENT;  Laterality: Right;   JOINT REPLACEMENT     bilateral hip replacements   MELANOMA EXCISION  12/2011   POLYPECTOMY  01/17/2020   Procedure: POLYPECTOMY;  Surgeon: Charlanne Groom, MD;  Location: [facility];  Service: Endoscopy;;   TOTAL HIP ARTHROPLASTY Left 06/17/2014   Procedure: LEFT TOTAL HIP ARTHROPLASTY ANTERIOR APPROACH;  Surgeon: Lonni CINDERELLA Poli, MD;  Location: [facility];  Service: Orthopedics;  Laterality: Left;   TOTAL HIP ARTHROPLASTY Right 09/02/2014   Procedure: RIGHT TOTAL HIP ARTHROPLASTY ANTERIOR APPROACH;  Surgeon: Lonni CINDERELLA Poli, MD;  Location: [facility];  Service: Orthopedics;  Laterality: Right;   VEIN SURGERY  05/2011   venous ablation    Patient Active Problem List   Diagnosis Date  Noted   Chronic intermittent hypoxia with obstructive sleep apnea 01/26/2024   Sleep-related hypoxia 11/08/2023   Psychophysiological insomnia 07/14/2023   Weight gain 07/14/2023   S/P placement of hypoglossal nerve stimulator 01/16/2023   Macrocytosis 12/28/2021   Malignant neoplasm of upper-outer quadrant of left breast in female, estrogen receptor positive (HCC) 01/07/2021   Vitamin D  deficiency 12/01/2020   Intolerance of continuous positive airway pressure (CPAP) ventilation 05/13/2020   Severe obstructive sleep apnea-hypopnea syndrome 05/13/2020   Hx of colonic polyps    Adenomatous polyp of ascending colon    Genetic testing 06/24/2019   Family history of uterine cancer    Family history of colon cancer    History of uterine cancer    History of sebaceous adenoma    S/P repair of paraesophageal hernia 03/12/2019   Syncope 02/27/2018   Community acquired pneumonia 02/10/2018   CAP (community acquired pneumonia) 02/10/2018   Long term current use of antiarrhythmic drug 11/02/2015   Varicose veins 11/02/2015   OAB (overactive bladder) 09/07/2015   History of melanoma 06/10/2015   Thyroid  nodule 06/10/2015   GERD (gastroesophageal reflux disease) 05/25/2015   Chronic venous insufficiency 05/25/2015   Hx of essential hypertension 05/25/2015   Atrial fibrillation (HCC) 04/22/2014   Osteoarthritis, hip, bilateral 03/25/2014   Lumbar and sacral osteoarthritis 12/10/2013    PCP: Tully Theophilus Andrews, MD  REFERRING PROVIDER: Lonell Sprang, DO  REFERRING DIAG: [phone] (ICD-10-CM) - Chronic right shoulder pain M75.101 (ICD-10-CM) - Tear of right supraspinatus tendon S46.811D (ICD-10-CM) - Tear of right infraspinatus tendon, subsequent encounter  THERAPY DIAG:  Acute pain of right shoulder  Stiffness of right shoulder, not elsewhere classified  Muscle weakness (generalized)  Stiffness of left shoulder, not elsewhere classified  Abnormal posture  Rationale for  Evaluation and Treatment: Rehabilitation  ONSET DATE: February 16, 2024  SUBJECTIVE:  SUBJECTIVE STATEMENT: I was on a mission.   Hand dominance: Right  PERTINENT HISTORY: Patient reports that she tripped on a drain guard where she landed on her wrist and forearm which jolted her shoulder. She currently has a small supraspinatus and infraspinatus tear found via ultrasound. She also had a rotator cuff injury in October 2024 where she extended her arm to reach a purse behind her. She has undergone cortisone injections in the shoulder before. Patient also reports a decrease in Lt shoulder ROM and strength secondary to radiation therapy. Patient endorses bilat THA, but no other surgeries, trauma, or injuries. Patient reports difficulty with reaching forward without Lt UE support, fatigue and achiness with playing piano, and lifting any sort of weight with UE.   See PMH or personal factors for in depth comorbidities ; no BP on Lt UE   PAIN:  Are you having pain? Yes: NPRS scale: 0/10 at rest, 5/10 at max  Pain location: Rt shoulder  Pain description: sharp, shooting  Aggravating factors: lying flat, lifting fwd (shoulder flexion), reaching fwd to turn on car, putting on makeup   Relieving factors: bringing it across and supporting, OTC (ibuprofen )   PRECAUTIONS: Fall and Other: no BP on Lt UE   RED FLAGS: Cervical red flags: Dysphagia No, Dysmetria No, Diplopia No, Nystagmus No, and Nausea No, Bowel or bladder incontinence: Yes: on medication, chronic, and Cauda equina syndrome: No   WEIGHT BEARING RESTRICTIONS: No  FALLS:  Has patient fallen in last 6 months? Yes. 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 Burnetta   OBJECTIVE:  Note: Objective measures were completed at Evaluation unless otherwise noted.  DIAGNOSTIC FINDINGS:  4 views of right shoulder x-rays obtained including AP, Grashey, axillary,  and Scap Y view.  Proper alignment of glenohumeral joint with no evidence  of acute fractures or acute other osseous abnormalities.  Patient does  have evidence of mild/moderate glenohumeral and acromioclavicular  osteoarthritis.  There is mild calcification superior to the humeral head,  possibly indicative of tearing/pathology.   PATIENT SURVEYS :  PSFS: THE PATIENT SPECIFIC FUNCTIONAL SCALE  Place score of 0-10 (0 = unable to perform activity and 10 = able to perform activity at the same level as before injury or problem)  Activity Date: 03/26/2024    Reaching forward  3    2. Lifting (putting away dishes)  1    3.     4.      Total Score 2      Total Score = Sum of activity scores/number of activities  Minimally Detectable Change: 3 points (for single activity); 2 points (for average score)  [facility] (nd). The Patient Specific Functional Scale . Retrieved from [email or web address]   COGNITION: Overall cognitive status: Within functional limits for tasks assessed     SENSATION: Light touch: WFL  POSTURE: rounded shoulders, increased thoracic kyphosis, and forward head   UPPER EXTREMITY ROM:   Active ROM Right Eval 03/26/2024 Left Eval 03/26/2024  Shoulder flexion (seated) 145deg, difficulty 135deg  Shoulder extension    Shoulder abduction (seated) 140deg, difficulty and aching 137deg  Shoulder adduction    Shoulder internal rotation (seated) T10 T8   Shoulder external rotation (seated) T2 T2  Elbow flexion    Elbow extension    Wrist flexion    Wrist extension    Wrist ulnar deviation    Wrist radial deviation    Wrist pronation    Wrist supination    (Blank rows = not tested)  UPPER EXTREMITY MMT:  MMT Right Eval 03/26/2024 Left Eval 03/26/2024  Shoulder flexion (seated) 3+/5 4/5  Shoulder extension    Shoulder abduction (seated) 4/5 4/5  Shoulder adduction    Shoulder internal rotation (seated) 4/5 4/5  Shoulder external rotation (seated) 4-/5 4/5  Middle trapezius    Lower trapezius    Elbow flexion    Elbow extension    Wrist flexion    Wrist extension    Wrist ulnar deviation    Wrist radial deviation    Wrist pronation    Wrist supination    Grip strength (lbs)    (Blank rows = not tested)  SHOULDER SPECIAL TESTS: Impingement tests: Painful arc test: positive  Rotator cuff assessment: Empty can test: positive  and Full can test: positive  due to weakness, not necessarily pain                                                                                                                            TREATMENT DATE:  03/26/2024 TherEx:  HEP handout provided with patient performing one set of each activity for appropriate form. Verbal and tactile cues provided.   Self-Care:  POC and potential surgery options and what is taken into account to choose whether or not to undergo surgery   PATIENT EDUCATION: Education details: HEP, POC, surgical options Person educated: Patient Education method: Explanation, Demonstration, Tactile cues, Verbal cues, and Handouts Education comprehension: verbalized understanding, returned demonstration, verbal cues required, and tactile cues required  HOME EXERCISE PROGRAM: Access Code: [email or web address] URL: [email or web address] Date: 03/26/2024 Prepared by: Susannah Daring  Exercises - Standing Shoulder Row with Anchored Resistance  - 1 x daily - 7 x weekly - 2 sets -  10 reps - 2-3sec hold - Shoulder External Rotation and Scapular Retraction with Resistance  - 1 x daily - 7 x weekly - 2 sets - 10 reps - AAROM/AROM Shoulder Flexion at Cabinet  - 1 x daily - 7 x weekly - 2 sets - 10 reps - AAROM/AROM Shoulder Scaption at Cabinet  - 1 x daily - 7 x weekly - 2 sets - 10 reps - Shoulder Extension with Resistance  - 1 x daily - 7 x weekly - 2 sets - 10 reps  ASSESSMENT:  CLINICAL IMPRESSION: Patient is a 74 y.o. F who was seen today for physical therapy evaluation and treatment for Rt shoulder pain presenting with deficits in functional mobility, strength, motor coordination, and posture. Patient experiences pain/difficulty with abduction and flexion ROM. Patient will benefit from skilled PT to address above noted  deficits.    OBJECTIVE IMPAIRMENTS: decreased coordination, decreased ROM, decreased strength, impaired flexibility, improper body mechanics, postural dysfunction, and pain.   ACTIVITY LIMITATIONS: carrying, lifting, and reach over head  PARTICIPATION LIMITATIONS: cleaning, driving, and community activity  PERSONAL FACTORS: Past/current experiences, Time since onset of injury/illness/exacerbation, and 3+ comorbidities: GERD, history of cancer, venous insufficiency, sleep apnea, scoliosis, peripheral vascular disease, paroxysmal a-fib, clotting disorder are also affecting patient's functional outcome.   REHAB POTENTIAL: Good  CLINICAL DECISION MAKING: Stable/uncomplicated  EVALUATION COMPLEXITY: Low  GOALS: Goals reviewed with patient? Yes  SHORT TERM GOALS: Target date: 04/16/2024  Patient will show compliance with initial HEP. Baseline: Goal status: INITIAL  2.  Patient will report pain levels no greater than 4/10 in order to show an overall improved quality of life. Baseline:  Goal status: INITIAL    LONG TERM GOALS: Target date: 05/21/2024  Patient will be independent with final HEP in order to maintain and progress upon functional  gains made within PT. Baseline:  Goal status: INITIAL  2.  Patient will report pain levels no greater than 2/10 in order to show an overall improved quality of life. Baseline:  Goal status: INITIAL  3.  Patient will increase PSFS to at least 4 in order to show a significant improvement in subjective disability rating. Baseline:  Goal status: INITIAL  4.  Patient will increase Rt shoulder flexion and abduction to [facility] in order to improve functional mobility.  Baseline:  Goal status: INITIAL  5.  Patient will increase Rt shoulder flexion to match Lt shoulder flexion in order to improve biomechanics with functional mobility. Baseline:  Goal status: INITIAL    PLAN: PT FREQUENCY: 1x/week  PT DURATION: 8 weeks  PLANNED INTERVENTIONS: [id number]- PT Re-evaluation, 97750- Physical Performance Testing, 97110-Therapeutic exercises, 97530- Therapeutic activity, [id number]- Neuromuscular re-education, 97535- Self Care, [id number]- Manual therapy, [id number]- Gait training, [phone]- Canalith repositioning, [id number]- Aquatic Therapy, [phone]- Electrical stimulation (unattended), [phone]- Electrical stimulation (manual), [id number]- Vasopneumatic device, [id number]- Ultrasound, [id number]- Traction (mechanical), [id number]- Ionotophoresis 4mg /ml Dexamethasone , [id number] (1-2 muscles), [id number] (3+ muscles)- Dry Needling, Patient/Family education, Balance training, Stair training, Taping, Joint mobilization, Joint manipulation, Spinal manipulation, Spinal mobilization, Vestibular training, DME instructions, Cryotherapy, and Moist heat  PLAN FOR NEXT SESSION: review HEP, shoulder strength and mobility, postural strengthening    Susannah Daring, PT, DPT 03/26/2024 12:44 PM    "

## 2024-03-26 ENCOUNTER — Ambulatory Visit

## 2024-03-26 ENCOUNTER — Other Ambulatory Visit: Payer: Self-pay | Admitting: Cardiology

## 2024-03-26 DIAGNOSIS — M25511 Pain in right shoulder: Secondary | ICD-10-CM

## 2024-03-26 DIAGNOSIS — M25611 Stiffness of right shoulder, not elsewhere classified: Secondary | ICD-10-CM | POA: Diagnosis not present

## 2024-03-26 DIAGNOSIS — M6281 Muscle weakness (generalized): Secondary | ICD-10-CM | POA: Diagnosis not present

## 2024-03-26 DIAGNOSIS — M25612 Stiffness of left shoulder, not elsewhere classified: Secondary | ICD-10-CM

## 2024-03-26 DIAGNOSIS — R293 Abnormal posture: Secondary | ICD-10-CM | POA: Diagnosis not present

## 2024-03-26 DIAGNOSIS — I4891 Unspecified atrial fibrillation: Secondary | ICD-10-CM

## 2024-03-26 NOTE — Telephone Encounter (Signed)
 Pt last saw Jodie Passey, [place] on 12/25/23, last labs 12/25/23 Creat 0.75, age 74, weight 96.1kg, based on specified criteria pt is on appropriate dosage of Eliquis  5mg  BID for afib.  Will refill rx.

## 2024-04-02 ENCOUNTER — Encounter: Payer: Self-pay | Admitting: Physical Therapy

## 2024-04-02 ENCOUNTER — Ambulatory Visit: Admitting: Physical Therapy

## 2024-04-02 DIAGNOSIS — R293 Abnormal posture: Secondary | ICD-10-CM

## 2024-04-02 DIAGNOSIS — M25511 Pain in right shoulder: Secondary | ICD-10-CM

## 2024-04-02 DIAGNOSIS — M25611 Stiffness of right shoulder, not elsewhere classified: Secondary | ICD-10-CM

## 2024-04-02 DIAGNOSIS — M6281 Muscle weakness (generalized): Secondary | ICD-10-CM

## 2024-04-02 DIAGNOSIS — M25612 Stiffness of left shoulder, not elsewhere classified: Secondary | ICD-10-CM

## 2024-04-02 NOTE — Therapy (Signed)
 " OUTPATIENT PHYSICAL THERAPY UPPER EXTREMITY TREATMENT    Patient Name: MIKEYLA MUSIC MRN: [id number] DOB:1950/03/17, 74 y.o., female Today's Date: 04/02/2024  END OF SESSION:  PT End of Session - 04/02/24 0905     Visit Number 2    Number of Visits 8    Date for Recertification  05/21/24    Authorization Type [employer] $15 COPAY    Progress Note Due on Visit 10    PT Start Time 0849    PT Stop Time 0927    PT Time Calculation (min) 38 min    Activity Tolerance Patient tolerated treatment well    Behavior During Therapy Anxious           Past Medical History:  Diagnosis Date   Breast cancer (HCC)    Chronic edema    Clotting disorder    Constipation    Difficult intubation    pt states that her neck needs to be in a neutral position,  scoliosisand herniated dics in neck   Family history of colon cancer    Family history of uterine cancer    Gallstones    GERD (gastroesophageal reflux disease)    protonix , hx h. pylori   Herniated disc, cervical    History of hiatal hernia    History of sebaceous adenoma    History of uterine cancer 04/22/2014   sees Dr. 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 Tricia in dermatology right upper arm   Obesity    OSA on CPAP    uses CPAP   Paroxysmal atrial fibrillation (HCC)    Peripheral vascular disease    Personal history of radiation therapy    PONV (postoperative nausea and vomiting)    Recurrent UTI    S/P hip replacement    Scoliosis    Sleep apnea    Thyroid  nodule    Urinary incontinence    Uterine cancer (HCC)    Stage 1   Venous insufficiency    s/p ablation   Past Surgical History:  Procedure Laterality Date   ABDOMINAL HYSTERECTOMY  09/10/2013   ATRIAL FIBRILLATION ABLATION N/A 08/09/2019   Procedure: ATRIAL FIBRILLATION ABLATION;  Surgeon: Inocencio Soyla Lunger, MD;  Location: [facility];  Service:  Cardiovascular;  Laterality: N/A;   BREAST LUMPECTOMY     BREAST LUMPECTOMY WITH RADIOACTIVE SEED AND SENTINEL LYMPH NODE BIOPSY Left 02/16/2021   Procedure: LEFT BREAST LUMPECTOMY WITH RADIOACTIVE SEED X2 AND SENTINEL LYMPH NODE BIOPSY;  Surgeon: Vanderbilt Ned, MD;  Location: [facility];  Service: General;  Laterality: Left;   BUNIONECTOMY  10/1976   CHOLECYSTECTOMY N/A 03/12/2019   Procedure: XI ROBOTIC ASSISTED CHOLECYSTECTOMY;  Surgeon: Signe Mitzie LABOR, MD;  Location: [facility];  Service: General;  Laterality: N/A;   COLONOSCOPY     COLONOSCOPY WITH PROPOFOL  N/A 01/17/2020   Procedure: COLONOSCOPY WITH PROPOFOL ;  Surgeon: Charlanne Groom, MD;  Location: [facility];  Service: Endoscopy;  Laterality: N/A;   DRUG INDUCED ENDOSCOPY N/A 03/04/2020   Procedure: DRUG INDUCED ENDOSCOPY;  Surgeon: Carlie Clark, MD;  Location:  SURGERY CENTER;  Service: ENT;  Laterality: N/A;   FRACTURE SURGERY  09/1964   floor of orbit right side   HIATAL HERNIA REPAIR     IMPLANTATION OF HYPOGLOSSAL NERVE STIMULATOR Right 04/15/2020   Procedure: IMPLANTATION OF HYPOGLOSSAL NERVE STIMULATOR;  Surgeon: Carlie Clark,  MD;  Location: [facility];  Service: ENT;  Laterality: Right;   JOINT REPLACEMENT     bilateral hip replacements   MELANOMA EXCISION  12/2011   POLYPECTOMY  01/17/2020   Procedure: POLYPECTOMY;  Surgeon: Charlanne Groom, MD;  Location: [facility];  Service: Endoscopy;;   TOTAL HIP ARTHROPLASTY Left 06/17/2014   Procedure: LEFT TOTAL HIP ARTHROPLASTY ANTERIOR APPROACH;  Surgeon: Lonni CINDERELLA Poli, MD;  Location: [facility];  Service: Orthopedics;  Laterality: Left;   TOTAL HIP ARTHROPLASTY Right 09/02/2014   Procedure: RIGHT TOTAL HIP ARTHROPLASTY ANTERIOR APPROACH;  Surgeon: Lonni CINDERELLA Poli, MD;  Location: [facility];  Service: Orthopedics;  Laterality: Right;   VEIN SURGERY  05/2011   venous ablation    Patient Active Problem List   Diagnosis Date  Noted   Chronic intermittent hypoxia with obstructive sleep apnea 01/26/2024   Sleep-related hypoxia 11/08/2023   Psychophysiological insomnia 07/14/2023   Weight gain 07/14/2023   S/P placement of hypoglossal nerve stimulator 01/16/2023   Macrocytosis 12/28/2021   Malignant neoplasm of upper-outer quadrant of left breast in female, estrogen receptor positive (HCC) 01/07/2021   Vitamin D  deficiency 12/01/2020   Intolerance of continuous positive airway pressure (CPAP) ventilation 05/13/2020   Severe obstructive sleep apnea-hypopnea syndrome 05/13/2020   Hx of colonic polyps    Adenomatous polyp of ascending colon    Genetic testing 06/24/2019   Family history of uterine cancer    Family history of colon cancer    History of uterine cancer    History of sebaceous adenoma    S/P repair of paraesophageal hernia 03/12/2019   Syncope 02/27/2018   Community acquired pneumonia 02/10/2018   CAP (community acquired pneumonia) 02/10/2018   Long term current use of antiarrhythmic drug 11/02/2015   Varicose veins 11/02/2015   OAB (overactive bladder) 09/07/2015   History of melanoma 06/10/2015   Thyroid  nodule 06/10/2015   GERD (gastroesophageal reflux disease) 05/25/2015   Chronic venous insufficiency 05/25/2015   Hx of essential hypertension 05/25/2015   Atrial fibrillation (HCC) 04/22/2014   Osteoarthritis, hip, bilateral 03/25/2014   Lumbar and sacral osteoarthritis 12/10/2013    PCP: Tully Theophilus Andrews, MD  REFERRING PROVIDER: Lonell Sprang, DO  REFERRING DIAG: [phone] (ICD-10-CM) - Chronic right shoulder pain M75.101 (ICD-10-CM) - Tear of right supraspinatus tendon S46.811D (ICD-10-CM) - Tear of right infraspinatus tendon, subsequent encounter  THERAPY DIAG:  Acute pain of right shoulder  Stiffness of right shoulder, not elsewhere classified  Muscle weakness (generalized)  Stiffness of left shoulder, not elsewhere classified  Abnormal posture  Rationale for  Evaluation and Treatment: Rehabilitation  ONSET DATE: February 16, 2024  SUBJECTIVE:  SUBJECTIVE STATEMENT:  I have good days and bad days, it gets frustrating. I'm having pain today. Having a hard time putting away dishes. Its hard to see that I'm making any progress. There have been some times at night where it feels like the shoulder is not in the right place, then it crunches and feels like it goes back in. Dr. Burnetta said I can make an appt at any time to talk about surgery, I'd like your opinion.    EVAL: I was on a mission.   Hand dominance: Right  PERTINENT HISTORY: Patient reports that she tripped on a drain guard where she landed on her wrist and forearm which jolted her shoulder. She currently has a small supraspinatus and infraspinatus tear found via ultrasound. She also had a rotator cuff injury in October 2024 where she extended her arm to reach a purse behind her. She has undergone cortisone injections in the shoulder before. Patient also reports a decrease in Lt shoulder ROM and strength secondary to radiation therapy. Patient endorses bilat THA, but no other surgeries, trauma, or injuries. Patient reports difficulty with reaching forward without Lt UE support, fatigue and achiness with playing piano, and lifting any sort of weight with UE.   See PMH or personal factors for in depth comorbidities ; no BP on Lt UE   PAIN:  Are you having pain? Yes: NPRS scale: 1/10 at rest, 6/10 worst in the past week  Pain location: Rt shoulder  Pain description: sharp, shooting  Aggravating factors: lying flat, lifting fwd (shoulder flexion), reaching fwd to turn on car, putting on makeup, OH reaches   Relieving factors: bringing it across and supporting, OTC (ibuprofen )   PRECAUTIONS: Fall and Other: no BP on  Lt UE   RED FLAGS: Cervical red flags: Dysphagia No, Dysmetria No, Diplopia No, Nystagmus No, and Nausea No, Bowel or bladder incontinence: Yes: on medication, chronic, and Cauda equina syndrome: No   WEIGHT BEARING RESTRICTIONS: No  FALLS:  Has patient fallen in last 6 months? Yes. 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 Burnetta   OBJECTIVE:  Note: Objective measures were completed at Evaluation unless otherwise noted.  DIAGNOSTIC FINDINGS:  4 views of right shoulder x-rays obtained including AP, Grashey, axillary,  and Scap Y view.  Proper alignment of glenohumeral joint with no evidence  of acute fractures or acute other osseous abnormalities.  Patient does  have evidence of mild/moderate glenohumeral and acromioclavicular  osteoarthritis.  There is mild calcification superior to the humeral head,  possibly indicative of tearing/pathology.   PATIENT SURVEYS :  PSFS: THE PATIENT SPECIFIC FUNCTIONAL SCALE  Place score of 0-10 (0 = unable to perform activity and 10 = able to perform activity at the same level as before injury or problem)  Activity Date: 03/26/2024    Reaching forward  3    2. Lifting (putting away dishes)  1    3.     4.      Total Score 2      Total Score = Sum of activity scores/number of activities  Minimally Detectable Change: 3 points (for  single activity); 2 points (for average score)  [facility] (nd). The Patient Specific Functional Scale . Retrieved from [email or web address]   COGNITION: Overall cognitive status: Within functional  limits for tasks assessed     SENSATION: Light touch: WFL  POSTURE: rounded shoulders, increased thoracic kyphosis, and forward head   UPPER EXTREMITY ROM:   Active ROM Right Eval 03/26/2024 Left Eval 03/26/2024  Shoulder flexion (seated) 145deg, difficulty 135deg  Shoulder extension    Shoulder abduction (seated) 140deg, difficulty and aching 137deg  Shoulder adduction    Shoulder internal rotation (seated) T10 T8  Shoulder external rotation (seated) T2 T2  Elbow flexion    Elbow extension    Wrist flexion    Wrist extension    Wrist ulnar deviation    Wrist radial deviation    Wrist pronation    Wrist supination    (Blank rows = not tested)  UPPER EXTREMITY MMT:  MMT Right Eval 03/26/2024 Left Eval 03/26/2024  Shoulder flexion (seated) 3+/5 4/5  Shoulder extension    Shoulder abduction (seated) 4/5 4/5  Shoulder adduction    Shoulder internal rotation (seated) 4/5 4/5  Shoulder external rotation (seated) 4-/5 4/5  Middle trapezius    Lower trapezius    Elbow flexion    Elbow extension    Wrist flexion    Wrist extension    Wrist ulnar deviation    Wrist radial deviation    Wrist pronation    Wrist supination    Grip strength (lbs)    (Blank rows = not tested)  SHOULDER SPECIAL TESTS: Impingement tests: Painful arc test: positive  Rotator cuff assessment: Empty can test: positive  and Full can test: positive  due to weakness, not necessarily pain                                                                                                                            TREATMENT DATE:   04/02/24  Educated on time frames for improvement with PT, assured her that really she has only had PT eval only and even though she has been compliant with HEP (and this is appreciated) it is realistically too early to notice significant pain relief/progress from working with us - would recommend trying at least 5-6 visits and then assessing progress/pain at that point.  Encouraged her to try a bit more PT before calling MD for surgical consult especially as baseline ROM/MMT looked fairly good.   Wall ladder for flexion ROM x10 R Supine AAROM 2# [place] x12  Chest presses 2# W[place] x12  Scap retractions green TB x12  Shoulder extensions green TB x12   UBE L1x4 min forward/4 min backward for shoulder ROM and w/u    03/26/2024 TherEx:  HEP handout provided with patient performing one set of each activity for appropriate form. Verbal and tactile cues provided.   Self-Care:  POC and potential surgery options and what is taken into account to choose whether or not  to undergo surgery   PATIENT EDUCATION: Education details: HEP, POC, surgical options Person educated: Patient Education method: Explanation, Demonstration, Tactile cues, Verbal cues, and Handouts Education comprehension: verbalized understanding, returned demonstration, verbal cues required, and tactile cues required  HOME EXERCISE PROGRAM: Access Code: [email or web address] URL: [email or web address] Date: 03/26/2024 Prepared by: Susannah Daring  Exercises - Standing Shoulder Row with Anchored Resistance  - 1 x daily - 7 x weekly - 2 sets - 10 reps - 2-3sec hold - Shoulder External Rotation and Scapular Retraction with Resistance  - 1 x daily - 7 x weekly - 2 sets - 10 reps - AAROM/AROM Shoulder Flexion at Cabinet  - 1 x daily - 7 x weekly - 2 sets - 10 reps - AAROM/AROM Shoulder Scaption at Cabinet  - 1 x daily - 7 x weekly - 2 sets - 10 reps - Shoulder Extension with Resistance  - 1 x daily - 7 x weekly - 2 sets - 10 reps  ASSESSMENT:  CLINICAL IMPRESSION:   Arrived today doing OK but reports a lot of frustration that her shoulder is not feeling better yet- assured her it is still very early with PT and educated as above. Otherwise worked on ROM and strength as tolerated today, she did perseverate on pain (some of which was likely muscular- fatigue, mm soreness, stretching, etc- and  expected during exercises) but able to be redirected. Will continue to progress all interventions as able/tolerated.    EVAL: Patient is a 74 y.o. F who was seen today for physical therapy evaluation and treatment for Rt shoulder pain presenting with deficits in functional mobility, strength, motor coordination, and posture. Patient experiences pain/difficulty with abduction and flexion ROM. Patient will benefit from skilled PT to address above noted deficits.    OBJECTIVE IMPAIRMENTS: decreased coordination, decreased ROM, decreased strength, impaired flexibility, improper body mechanics, postural dysfunction, and pain.   ACTIVITY LIMITATIONS: carrying, lifting, and reach over head  PARTICIPATION LIMITATIONS: cleaning, driving, and community activity  PERSONAL FACTORS: Past/current experiences, Time since onset of injury/illness/exacerbation, and 3+ comorbidities: GERD, history of cancer, venous insufficiency, sleep apnea, scoliosis, peripheral vascular disease, paroxysmal a-fib, clotting disorder are also affecting patient's functional outcome.   REHAB POTENTIAL: Good  CLINICAL DECISION MAKING: Stable/uncomplicated  EVALUATION COMPLEXITY: Low  GOALS: Goals reviewed with patient? Yes  SHORT TERM GOALS: Target date: 04/16/2024  Patient will show compliance with initial HEP. Baseline: Goal status: INITIAL  2.  Patient will report pain levels no greater than 4/10 in order to show an overall improved quality of life. Baseline:  Goal status: INITIAL    LONG TERM GOALS: Target date: 05/21/2024  Patient will be independent with final HEP in order to maintain and progress upon functional gains made within PT. Baseline:  Goal status: INITIAL  2.  Patient will report pain levels no greater than 2/10 in order to show an overall improved quality of life. Baseline:  Goal status: INITIAL  3.  Patient will increase PSFS to at least 4 in order to show a significant improvement in  subjective disability rating. Baseline:  Goal status: INITIAL  4.  Patient will increase Rt shoulder flexion and abduction to [facility] in order to improve functional mobility.  Baseline:  Goal status: INITIAL  5.  Patient will increase Rt shoulder flexion to match Lt shoulder flexion in order to improve biomechanics with functional mobility. Baseline:  Goal status: INITIAL    PLAN: PT FREQUENCY: 1x/week  PT DURATION: 8 weeks  PLANNED INTERVENTIONS: 02835- PT  Re-evaluation, 97750- Physical Performance Testing, 97110-Therapeutic exercises, 97530- Therapeutic activity, [id number]- Neuromuscular re-education, [phone]- Self Care, 02859- Manual therapy, [phone]- Gait training, [phone]- [place] repositioning, [id number]- Aquatic Therapy, [phone]- Electrical stimulation (unattended), [phone]- Electrical stimulation (manual), [id number]- Vasopneumatic device, [id number]- Ultrasound, [id number]- Traction (mechanical), [id number]- Ionotophoresis 4mg /ml Dexamethasone , [id number] (1-2 muscles), [id number] (3+ muscles)- Dry Needling, Patient/Family education, Balance training, Stair training, Taping, Joint mobilization, Joint manipulation, Spinal manipulation, Spinal mobilization, Vestibular training, DME instructions, Cryotherapy, and Moist heat  PLAN FOR NEXT SESSION: review HEP, shoulder strength and mobility, postural strengthening. PREs as tolerated    Josette Rough, PT, DPT 04/02/24 9:27 AM    "

## 2024-04-10 ENCOUNTER — Ambulatory Visit

## 2024-04-10 DIAGNOSIS — M25612 Stiffness of left shoulder, not elsewhere classified: Secondary | ICD-10-CM

## 2024-04-10 DIAGNOSIS — M6281 Muscle weakness (generalized): Secondary | ICD-10-CM | POA: Diagnosis not present

## 2024-04-10 DIAGNOSIS — R293 Abnormal posture: Secondary | ICD-10-CM | POA: Diagnosis not present

## 2024-04-10 DIAGNOSIS — M25511 Pain in right shoulder: Secondary | ICD-10-CM

## 2024-04-10 DIAGNOSIS — M25611 Stiffness of right shoulder, not elsewhere classified: Secondary | ICD-10-CM

## 2024-04-10 NOTE — Therapy (Signed)
 " OUTPATIENT PHYSICAL THERAPY UPPER EXTREMITY TREATMENT    Patient Name: [name] MRN: [id number] DOB:Apr 22, 1950, 74 y.o., female Today's Date: 04/10/2024  END OF SESSION:  PT End of Session - 04/10/24 0926     Visit Number 3    Number of Visits 8    Date for Recertification  05/21/24    Authorization Type [employer] $15 COPAY    Progress Note Due on Visit 10    PT Start Time 0929    PT Stop Time 1010    PT Time Calculation (min) 41 min    Activity Tolerance Patient tolerated treatment well    Behavior During Therapy Anxious            Past Medical History:  Diagnosis Date   Breast cancer (HCC)    Chronic edema    Clotting disorder    Constipation    Difficult intubation    pt states that her neck needs to be in a neutral position,  scoliosisand herniated dics in neck   Family history of colon cancer    Family history of uterine cancer    Gallstones    GERD (gastroesophageal reflux disease)    protonix , hx h. pylori   Herniated disc, cervical    History of hiatal hernia    History of sebaceous adenoma    History of uterine cancer 04/22/2014   sees Dr. 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 Tricia in dermatology right upper arm   Obesity    OSA on CPAP    uses CPAP   Paroxysmal atrial fibrillation (HCC)    Peripheral vascular disease    Personal history of radiation therapy    PONV (postoperative nausea and vomiting)    Recurrent UTI    S/P hip replacement    Scoliosis    Sleep apnea    Thyroid  nodule    Urinary incontinence    Uterine cancer (HCC)    Stage 1   Venous insufficiency    s/p ablation   Past Surgical History:  Procedure Laterality Date   ABDOMINAL HYSTERECTOMY  09/10/2013   ATRIAL FIBRILLATION ABLATION N/A 08/09/2019   Procedure: ATRIAL FIBRILLATION ABLATION;  Surgeon: Inocencio Soyla Lunger, MD;  Location: [facility];  Service:  Cardiovascular;  Laterality: N/A;   BREAST LUMPECTOMY     BREAST LUMPECTOMY WITH RADIOACTIVE SEED AND SENTINEL LYMPH NODE BIOPSY Left 02/16/2021   Procedure: LEFT BREAST LUMPECTOMY WITH RADIOACTIVE SEED X2 AND SENTINEL LYMPH NODE BIOPSY;  Surgeon: Vanderbilt Ned, MD;  Location: [facility];  Service: General;  Laterality: Left;   BUNIONECTOMY  10/1976   CHOLECYSTECTOMY N/A 03/12/2019   Procedure: XI ROBOTIC ASSISTED CHOLECYSTECTOMY;  Surgeon: Signe Mitzie LABOR, MD;  Location: [facility];  Service: General;  Laterality: N/A;   COLONOSCOPY     COLONOSCOPY WITH PROPOFOL  N/A 01/17/2020   Procedure: COLONOSCOPY WITH PROPOFOL ;  Surgeon: Charlanne Groom, MD;  Location: [facility];  Service: Endoscopy;  Laterality: N/A;   DRUG INDUCED ENDOSCOPY N/A 03/04/2020   Procedure: DRUG INDUCED ENDOSCOPY;  Surgeon: Carlie Clark, MD;  Location: [facility];  Service: ENT;  Laterality: N/A;   FRACTURE SURGERY  09/1964   floor of orbit right side   HIATAL HERNIA REPAIR     IMPLANTATION OF HYPOGLOSSAL NERVE STIMULATOR Right 04/15/2020   Procedure: IMPLANTATION OF HYPOGLOSSAL NERVE STIMULATOR;  Surgeon: Carlie,  Vaughan, MD;  Location: [facility];  Service: ENT;  Laterality: Right;   JOINT REPLACEMENT     bilateral hip replacements   MELANOMA EXCISION  12/2011   POLYPECTOMY  01/17/2020   Procedure: POLYPECTOMY;  Surgeon: Charlanne Groom, MD;  Location: [facility];  Service: Endoscopy;;   TOTAL HIP ARTHROPLASTY Left 06/17/2014   Procedure: LEFT TOTAL HIP ARTHROPLASTY ANTERIOR APPROACH;  Surgeon: Lonni CINDERELLA Poli, MD;  Location: [facility];  Service: Orthopedics;  Laterality: Left;   TOTAL HIP ARTHROPLASTY Right 09/02/2014   Procedure: RIGHT TOTAL HIP ARTHROPLASTY ANTERIOR APPROACH;  Surgeon: Lonni CINDERELLA Poli, MD;  Location: [facility];  Service: Orthopedics;  Laterality: Right;   VEIN SURGERY  05/2011   venous ablation    Patient Active Problem List   Diagnosis Date  Noted   Chronic intermittent hypoxia with obstructive sleep apnea 01/26/2024   Sleep-related hypoxia 11/08/2023   Psychophysiological insomnia 07/14/2023   Weight gain 07/14/2023   S/P placement of hypoglossal nerve stimulator 01/16/2023   Macrocytosis 12/28/2021   Malignant neoplasm of upper-outer quadrant of left breast in female, estrogen receptor positive (HCC) 01/07/2021   Vitamin D  deficiency 12/01/2020   Intolerance of continuous positive airway pressure (CPAP) ventilation 05/13/2020   Severe obstructive sleep apnea-hypopnea syndrome 05/13/2020   Hx of colonic polyps    Adenomatous polyp of ascending colon    Genetic testing 06/24/2019   Family history of uterine cancer    Family history of colon cancer    History of uterine cancer    History of sebaceous adenoma    S/P repair of paraesophageal hernia 03/12/2019   Syncope 02/27/2018   Community acquired pneumonia 02/10/2018   CAP (community acquired pneumonia) 02/10/2018   Long term current use of antiarrhythmic drug 11/02/2015   Varicose veins 11/02/2015   OAB (overactive bladder) 09/07/2015   History of melanoma 06/10/2015   Thyroid  nodule 06/10/2015   GERD (gastroesophageal reflux disease) 05/25/2015   Chronic venous insufficiency 05/25/2015   Hx of essential hypertension 05/25/2015   Atrial fibrillation (HCC) 04/22/2014   Osteoarthritis, hip, bilateral 03/25/2014   Lumbar and sacral osteoarthritis 12/10/2013    PCP: Tully Theophilus Andrews, MD  REFERRING PROVIDER: Lonell Sprang, DO  REFERRING DIAG: [phone] (ICD-10-CM) - Chronic right shoulder pain M75.101 (ICD-10-CM) - Tear of right supraspinatus tendon S46.811D (ICD-10-CM) - Tear of right infraspinatus tendon, subsequent encounter  THERAPY DIAG:  Acute pain of right shoulder  Stiffness of right shoulder, not elsewhere classified  Muscle weakness (generalized)  Stiffness of left shoulder, not elsewhere classified  Abnormal posture  Rationale for  Evaluation and Treatment: Rehabilitation  ONSET DATE: February 16, 2024  SUBJECTIVE:  SUBJECTIVE STATEMENT: It hurts. Patient reports that she has been able to position herself while sleeping that assists with elevating and decreasing pain. Patient endorses that her pain is mainly her bicep and upper arm.   Hand dominance: Right  PERTINENT HISTORY: Patient reports that she tripped on a drain guard where she landed on her wrist and forearm which jolted her shoulder. She currently has a small supraspinatus and infraspinatus tear found via ultrasound. She also had a rotator cuff injury in October 2024 where she extended her arm to reach a purse behind her. She has undergone cortisone injections in the shoulder before. Patient also reports a decrease in Lt shoulder ROM and strength secondary to radiation therapy. Patient endorses bilat THA, but no other surgeries, trauma, or injuries. Patient reports difficulty with reaching forward without Lt UE support, fatigue and achiness with playing piano, and lifting any sort of weight with UE.   See PMH or personal factors for in depth comorbidities ; no BP on Lt UE   PAIN:  Are you having pain? Yes: NPRS scale: 0/10 at rest, 2-3/10 on average, 5-6/10 at worst (reaching fwd to the car radio, etc.)  Pain location: Rt shoulder  Pain description: sharp, shooting  Aggravating factors: lying flat, lifting fwd (shoulder flexion), reaching fwd to turn on car, putting on makeup, OH reaches   Relieving factors: bringing it across and supporting, OTC (ibuprofen )   PRECAUTIONS: Fall and Other: no BP on Lt UE   RED FLAGS: Cervical red flags: Dysphagia No, Dysmetria No, Diplopia No, Nystagmus No, and Nausea No, Bowel or bladder incontinence: Yes: on medication, chronic, and Cauda  equina syndrome: No   WEIGHT BEARING RESTRICTIONS: No  FALLS:  Has patient fallen in last 6 months? Yes. 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 Burnetta   OBJECTIVE:  Note: Objective measures were completed at Evaluation unless otherwise noted.  DIAGNOSTIC FINDINGS:  4 views of right shoulder x-rays obtained including AP, Grashey, axillary,  and Scap Y view.  Proper alignment of glenohumeral joint with no evidence  of acute fractures or acute other osseous abnormalities.  Patient does  have evidence of mild/moderate glenohumeral and acromioclavicular  osteoarthritis.  There is mild calcification superior to the humeral head,  possibly indicative of tearing/pathology.   PATIENT SURVEYS :  PSFS: THE PATIENT SPECIFIC FUNCTIONAL SCALE  Place score of 0-10 (0 = unable to perform activity and 10 = able to perform activity at the same level as before injury or problem)  Activity Date: 03/26/2024    Reaching forward  3    2. Lifting (putting away dishes)  1    3.     4.      Total Score 2      Total Score = Sum of activity scores/number of activities  Minimally Detectable Change: 3 points (for single activity); 2 points (for average score)  [facility] (nd). The Patient Specific Functional Scale . Retrieved from [email or web address]   COGNITION: Overall cognitive status: Within functional limits for tasks assessed     SENSATION: Light touch: WFL  POSTURE: rounded shoulders, increased thoracic  kyphosis, and forward head   UPPER EXTREMITY ROM:   Active ROM  Right Eval 03/26/2024 Left Eval 03/26/2024  Shoulder flexion (seated) 145deg, difficulty 135deg  Shoulder extension    Shoulder abduction (seated) 140deg, difficulty and aching 137deg  Shoulder adduction    Shoulder internal rotation (seated) T10 T8  Shoulder external rotation (seated) T2 T2  Elbow flexion    Elbow extension    Wrist flexion    Wrist extension    Wrist ulnar deviation    Wrist radial deviation    Wrist pronation    Wrist supination    (Blank rows = not tested)  UPPER EXTREMITY MMT:  MMT Right Eval 03/26/2024 Left Eval 03/26/2024  Shoulder flexion (seated) 3+/5 4/5  Shoulder extension    Shoulder abduction (seated) 4/5 4/5  Shoulder adduction    Shoulder internal rotation (seated) 4/5 4/5  Shoulder external rotation (seated) 4-/5 4/5  Middle trapezius    Lower trapezius    Elbow flexion    Elbow extension    Wrist flexion    Wrist extension    Wrist ulnar deviation    Wrist radial deviation    Wrist pronation    Wrist supination    Grip strength (lbs)    (Blank rows = not tested)  SHOULDER SPECIAL TESTS: Impingement tests: Painful arc test: positive  Rotator cuff assessment: Empty can test: positive  and Full can test: positive  due to weakness, not necessarily pain                                                                                                                            TREATMENT DATE:  04/10/2024 TherAct:  UBE with bilat LE/UE 4 min fwd/4 min back Wall ladder, shoulder flexion 1x5 each side with 2-3s holds  Wall ladder, shoulder abduction 1x5 each side with 2-3s holds  Supine chest press with serratus punch 2x10 with 2# bar  Attempted supine shoulder flexion with 2# and 1# bar, but patient endorsing increased pain and difficulty this date so discontinued   Neuro Re-Ed:  Standing rows with green TB 2x15 with 2-3s holds  Standing straight arm pulldowns with green TB 2x15 with 2-3s holds     04/02/24  Educated on time  frames for improvement with PT, assured her that really she has only had PT eval only and even though she has been compliant with HEP (and this is appreciated) it is realistically too early to notice significant pain relief/progress from working with us - would recommend trying at least 5-6 visits and then assessing progress/pain at that point. Encouraged her to try a bit more PT before calling MD for surgical consult especially as baseline ROM/MMT looked fairly good.   Wall ladder for flexion ROM x10 R Supine AAROM 2# [place] x12  Chest presses 2# W[place] x12  Scap retractions green TB x12  Shoulder extensions green TB x12   UBE L1x4 min forward/4 min backward for shoulder ROM and  w/u    03/26/2024 TherEx:  HEP handout provided with patient performing one set of each activity for appropriate form. Verbal and tactile cues provided.   Self-Care:  POC and potential surgery options and what is taken into account to choose whether or not to undergo surgery   PATIENT EDUCATION: Education details: HEP, POC, surgical options Person educated: Patient Education method: Explanation, Demonstration, Tactile cues, Verbal cues, and Handouts Education comprehension: verbalized understanding, returned demonstration, verbal cues required, and tactile cues required  HOME EXERCISE PROGRAM: Access Code: [email or web address] URL: [email or web address] Date: 03/26/2024 Prepared by: Susannah Daring  Exercises - Standing Shoulder Row with Anchored Resistance  - 1 x daily - 7 x weekly - 2 sets - 10 reps - 2-3sec hold - Shoulder External Rotation and Scapular Retraction with Resistance  - 1 x daily - 7 x weekly - 2 sets - 10 reps - AAROM/AROM Shoulder Flexion at Cabinet  - 1 x daily - 7 x weekly - 2 sets - 10 reps - AAROM/AROM Shoulder Scaption at Cabinet  - 1 x daily - 7 x weekly - 2 sets - 10 reps - Shoulder Extension with Resistance  - 1 x daily - 7 x weekly - 2 sets - 10 reps  ASSESSMENT:  CLINICAL  IMPRESSION: Patient arrived to session noting intermittent high levels of pain, especially with forward reaching. Patient tolerated most activities this date with the exception of supine shoulder flexion with weight bar. Patient will continue to benefit from skilled PT.   OBJECTIVE IMPAIRMENTS: decreased coordination, decreased ROM, decreased strength, impaired flexibility, improper body mechanics, postural dysfunction, and pain.   ACTIVITY LIMITATIONS: carrying, lifting, and reach over head  PARTICIPATION LIMITATIONS: cleaning, driving, and community activity  PERSONAL FACTORS: Past/current experiences, Time since onset of injury/illness/exacerbation, and 3+ comorbidities: GERD, history of cancer, venous insufficiency, sleep apnea, scoliosis, peripheral vascular disease, paroxysmal a-fib, clotting disorder are also affecting patient's functional outcome.   REHAB POTENTIAL: Good  CLINICAL DECISION MAKING: Stable/uncomplicated  EVALUATION COMPLEXITY: Low  GOALS: Goals reviewed with patient? Yes  SHORT TERM GOALS: Target date: 04/16/2024  Patient will show compliance with initial HEP. Baseline: Goal status: INITIAL  2.  Patient will report pain levels no greater than 4/10 in order to show an overall improved quality of life. Baseline:  Goal status: INITIAL    LONG TERM GOALS: Target date: 05/21/2024  Patient will be independent with final HEP in order to maintain and progress upon functional gains made within PT. Baseline:  Goal status: INITIAL  2.  Patient will report pain levels no greater than 2/10 in order to show an overall improved quality of life. Baseline:  Goal status: INITIAL  3.  Patient will increase PSFS to at least 4 in order to show a significant improvement in subjective disability rating. Baseline:  Goal status: INITIAL  4.  Patient will increase Rt shoulder flexion and abduction to [facility] in order to improve functional mobility.  Baseline:  Goal status:  INITIAL  5.  Patient will increase Rt shoulder flexion to match Lt shoulder flexion in order to improve biomechanics with functional mobility. Baseline:  Goal status: INITIAL    PLAN: PT FREQUENCY: 1x/week  PT DURATION: 8 weeks  PLANNED INTERVENTIONS: [id number]- PT Re-evaluation, 97750- Physical Performance Testing, 97110-Therapeutic exercises, 97530- Therapeutic activity, [id number]- Neuromuscular re-education, 97535- Self Care, [id number]- Manual therapy, [id number]- Gait training, [phone]- Canalith repositioning, [id number]- Aquatic Therapy, [id number]- Electrical stimulation (unattended), [id number]- Electrical stimulation (manual), [id number]- Vasopneumatic device, [id number]- Ultrasound, [id number]- Traction (mechanical), [id number]- Ionotophoresis  4mg /ml Dexamethasone , 20560 (1-2 muscles), 20561 (3+ muscles)- Dry Needling, Patient/Family education, Balance training, Stair training, Taping, Joint mobilization, Joint manipulation, Spinal manipulation, Spinal mobilization, Vestibular training, DME instructions, Cryotherapy, and Moist heat  PLAN FOR NEXT SESSION:  review HEP, shoulder strength and mobility, postural strengthening. PREs as tolerated    Susannah Daring, PT, DPT 04/10/24 10:17 AM      "

## 2024-04-15 ENCOUNTER — Encounter: Payer: Self-pay | Admitting: Gastroenterology

## 2024-04-16 ENCOUNTER — Encounter: Admitting: Physical Therapy

## 2024-04-23 ENCOUNTER — Encounter

## 2024-04-29 ENCOUNTER — Encounter

## 2024-05-07 ENCOUNTER — Ambulatory Visit: Admitting: Sports Medicine

## 2025-02-19 ENCOUNTER — Ambulatory Visit

## 2025-03-20 ENCOUNTER — Inpatient Hospital Stay: Admitting: Hematology and Oncology
